# Patient Record
Sex: Male | Born: 1960 | Race: Black or African American | Hispanic: No | Marital: Single | Smoking: Former smoker
Health system: Southern US, Community
[De-identification: ages and names within clinical notes are randomized; demographics above are authoritative.]

## PROBLEM LIST (undated history)

## (undated) DIAGNOSIS — I10 Essential (primary) hypertension: Secondary | ICD-10-CM

## (undated) DIAGNOSIS — F1910 Other psychoactive substance abuse, uncomplicated: Secondary | ICD-10-CM

## (undated) DIAGNOSIS — F1420 Cocaine dependence, uncomplicated: Secondary | ICD-10-CM

## (undated) DIAGNOSIS — H547 Unspecified visual loss: Secondary | ICD-10-CM

## (undated) DIAGNOSIS — E7800 Pure hypercholesterolemia, unspecified: Secondary | ICD-10-CM

## (undated) DIAGNOSIS — F17200 Nicotine dependence, unspecified, uncomplicated: Secondary | ICD-10-CM

## (undated) HISTORY — PX: EYE SURGERY: SHX253

## (undated) HISTORY — DX: Other psychoactive substance abuse, uncomplicated: F19.10

## (undated) HISTORY — DX: Essential (primary) hypertension: I10

## (undated) HISTORY — DX: Nicotine dependence, unspecified, uncomplicated: F17.200

---

## 2003-01-09 ENCOUNTER — Emergency Department (HOSPITAL_COMMUNITY): Admission: EM | Admit: 2003-01-09 | Discharge: 2003-01-09 | Payer: Self-pay | Admitting: Emergency Medicine

## 2008-02-21 ENCOUNTER — Emergency Department (HOSPITAL_COMMUNITY): Admission: EM | Admit: 2008-02-21 | Discharge: 2008-02-21 | Payer: Self-pay | Admitting: Emergency Medicine

## 2008-06-14 ENCOUNTER — Emergency Department (HOSPITAL_COMMUNITY): Admission: EM | Admit: 2008-06-14 | Discharge: 2008-06-14 | Payer: Self-pay | Admitting: Emergency Medicine

## 2009-03-08 DIAGNOSIS — E1136 Type 2 diabetes mellitus with diabetic cataract: Secondary | ICD-10-CM | POA: Insufficient documentation

## 2009-05-03 ENCOUNTER — Ambulatory Visit: Payer: Self-pay | Admitting: Internal Medicine

## 2009-05-10 ENCOUNTER — Ambulatory Visit: Payer: Self-pay | Admitting: *Deleted

## 2009-07-21 ENCOUNTER — Ambulatory Visit: Payer: Self-pay | Admitting: Internal Medicine

## 2009-07-21 ENCOUNTER — Encounter: Payer: Self-pay | Admitting: Family Medicine

## 2009-07-21 LAB — CONVERTED CEMR LAB
ALT: 27 units/L (ref 0–53)
AST: 30 units/L (ref 0–37)
Albumin: 4.3 g/dL (ref 3.5–5.2)
Amphetamine Screen, Ur: NEGATIVE
Benzodiazepines.: NEGATIVE
CO2: 24 meq/L (ref 19–32)
Calcium: 9.1 mg/dL (ref 8.4–10.5)
Chloride: 103 meq/L (ref 96–112)
Cholesterol: 202 mg/dL — ABNORMAL HIGH (ref 0–200)
Cocaine Metabolites: NEGATIVE
Creatinine,U: 157.9 mg/dL
Eosinophils Absolute: 0.2 10*3/uL (ref 0.0–0.7)
Eosinophils Relative: 3 % (ref 0–5)
HCT: 39.3 % (ref 39.0–52.0)
Lymphocytes Relative: 47 % — ABNORMAL HIGH (ref 12–46)
Lymphs Abs: 2.6 10*3/uL (ref 0.7–4.0)
MCV: 93.3 fL (ref 78.0–100.0)
Monocytes Relative: 6 % (ref 3–12)
Neutrophils Relative %: 44 % (ref 43–77)
PSA: 0.66 ng/mL (ref 0.10–4.00)
Phencyclidine (PCP): NEGATIVE
Platelets: 334 10*3/uL (ref 150–400)
Potassium: 3.9 meq/L (ref 3.5–5.3)
RBC: 4.21 M/uL — ABNORMAL LOW (ref 4.22–5.81)
TSH: 1.258 microintl units/mL (ref 0.350–4.500)
WBC: 5.6 10*3/uL (ref 4.0–10.5)

## 2009-08-04 ENCOUNTER — Ambulatory Visit: Payer: Self-pay | Admitting: Internal Medicine

## 2009-09-01 ENCOUNTER — Ambulatory Visit: Payer: Self-pay | Admitting: Internal Medicine

## 2009-10-07 ENCOUNTER — Ambulatory Visit: Payer: Self-pay | Admitting: Internal Medicine

## 2010-02-22 ENCOUNTER — Ambulatory Visit: Payer: Self-pay | Admitting: Internal Medicine

## 2010-06-02 ENCOUNTER — Ambulatory Visit: Payer: Self-pay | Admitting: Internal Medicine

## 2010-06-06 ENCOUNTER — Ambulatory Visit: Payer: Self-pay | Admitting: Internal Medicine

## 2010-06-06 LAB — CONVERTED CEMR LAB
PSA: 0.7 ng/mL (ref 0.10–4.00)
Testosterone: 294.01 ng/dL — ABNORMAL LOW (ref 350–890)

## 2010-06-13 ENCOUNTER — Ambulatory Visit: Payer: Self-pay | Admitting: Internal Medicine

## 2010-06-27 ENCOUNTER — Ambulatory Visit: Payer: Self-pay | Admitting: Internal Medicine

## 2010-10-11 ENCOUNTER — Emergency Department (HOSPITAL_COMMUNITY)
Admission: EM | Admit: 2010-10-11 | Discharge: 2010-10-12 | Payer: Self-pay | Source: Home / Self Care | Admitting: Emergency Medicine

## 2010-11-07 ENCOUNTER — Encounter (INDEPENDENT_AMBULATORY_CARE_PROVIDER_SITE_OTHER): Payer: Self-pay | Admitting: Internal Medicine

## 2010-11-07 LAB — CONVERTED CEMR LAB
ALT: 12 units/L (ref 0–53)
AST: 12 units/L (ref 0–37)
Albumin: 4.1 g/dL (ref 3.5–5.2)
Alkaline Phosphatase: 67 units/L (ref 39–117)
BUN: 14 mg/dL (ref 6–23)
Potassium: 4.1 meq/L (ref 3.5–5.3)
Sodium: 132 meq/L — ABNORMAL LOW (ref 135–145)

## 2010-11-14 ENCOUNTER — Encounter (INDEPENDENT_AMBULATORY_CARE_PROVIDER_SITE_OTHER): Payer: Self-pay | Admitting: *Deleted

## 2010-11-14 LAB — CONVERTED CEMR LAB
BUN: 14 mg/dL (ref 6–23)
Creatinine, Ser: 1.11 mg/dL (ref 0.40–1.50)
Glucose, Bld: 460 mg/dL — ABNORMAL HIGH (ref 70–99)
Microalb, Ur: 1.21 mg/dL (ref 0.00–1.89)
Potassium: 4.3 meq/L (ref 3.5–5.3)

## 2011-02-07 LAB — URINALYSIS, ROUTINE W REFLEX MICROSCOPIC
Bilirubin Urine: NEGATIVE
Glucose, UA: >1000 mg/dL — AB
Ketones, ur: 40 mg/dL — AB
pH: 5 (ref 5.0–8.0)

## 2011-02-07 LAB — DIFFERENTIAL
Eosinophils Relative: 1 % (ref 0–5)
Lymphocytes Relative: 41 % (ref 12–46)
Lymphs Abs: 2.8 10*3/uL (ref 0.7–4.0)
Monocytes Absolute: 0.5 10*3/uL (ref 0.1–1.0)

## 2011-02-07 LAB — POCT I-STAT, CHEM 8
Chloride: 94 mEq/L — ABNORMAL LOW (ref 96–112)
HCT: 53 % — ABNORMAL HIGH (ref 39.0–52.0)
Potassium: 3.9 mEq/L (ref 3.5–5.1)
Sodium: 130 mEq/L — ABNORMAL LOW (ref 135–145)

## 2011-02-07 LAB — GLUCOSE, CAPILLARY

## 2011-02-07 LAB — URINE MICROSCOPIC-ADD ON

## 2011-02-07 LAB — CBC
HCT: 46.5 % (ref 39.0–52.0)
MCH: 32 pg (ref 26.0–34.0)
MCV: 87.4 fL (ref 78.0–100.0)
RBC: 5.32 MIL/uL (ref 4.22–5.81)
RDW: 13.4 % (ref 11.5–15.5)
WBC: 6.7 10*3/uL (ref 4.0–10.5)

## 2012-01-08 HISTORY — PX: CATARACT EXTRACTION: SUR2

## 2012-01-17 ENCOUNTER — Encounter: Payer: Self-pay | Admitting: Internal Medicine

## 2012-01-17 ENCOUNTER — Ambulatory Visit (INDEPENDENT_AMBULATORY_CARE_PROVIDER_SITE_OTHER): Payer: Self-pay | Admitting: Internal Medicine

## 2012-01-17 DIAGNOSIS — Z8679 Personal history of other diseases of the circulatory system: Secondary | ICD-10-CM

## 2012-01-17 DIAGNOSIS — Z8639 Personal history of other endocrine, nutritional and metabolic disease: Secondary | ICD-10-CM | POA: Insufficient documentation

## 2012-01-17 DIAGNOSIS — E119 Type 2 diabetes mellitus without complications: Secondary | ICD-10-CM

## 2012-01-17 DIAGNOSIS — Z79899 Other long term (current) drug therapy: Secondary | ICD-10-CM

## 2012-01-17 DIAGNOSIS — K219 Gastro-esophageal reflux disease without esophagitis: Secondary | ICD-10-CM | POA: Insufficient documentation

## 2012-01-17 DIAGNOSIS — I10 Essential (primary) hypertension: Secondary | ICD-10-CM | POA: Insufficient documentation

## 2012-01-17 LAB — LIPID PANEL
Cholesterol: 211 mg/dL — ABNORMAL HIGH (ref 0–200)
HDL: 43 mg/dL (ref >39)
LDL Cholesterol: 153 mg/dL — ABNORMAL HIGH (ref 0–99)
VLDL: 15 mg/dL (ref 0–40)

## 2012-01-17 LAB — GLUCOSE, CAPILLARY: Glucose-Capillary: 97 mg/dL (ref 70–99)

## 2012-01-17 NOTE — Assessment & Plan Note (Signed)
Has history of DM 2- but HbA1c today is 5.5, although he is taking metformin 1 pill( unknown dose) every other day for past 6-7 months. That would point towards him not having any diabetes now.  He does have a history of CBG more than 600 for which he was observed and CDU in ER, in 2011.  Though I will check lipid panel, CMP and urine microalbumin- creatinine ratio today.  I will see him back in 3-4 months for repeat A1c.

## 2012-01-17 NOTE — Patient Instructions (Signed)
Please make followup appointment in 3-4 months.  Your diabetes and blood pressure are under well control and does not need medication at this point of time. During next visit we'll check your back and see if you need any medications .  I will give you a call if any of the lab tests are abnormal, otherwise we'll see you back in about 3-4 months.

## 2012-01-17 NOTE — Assessment & Plan Note (Signed)
Blood pressure today is 133/84 which is within normal limits.  He is supposed to be on Maxzide and Norvasc which is not taking for past 6-7 months.  Based on today's blood pressure, I won't start him on blood pressure pills today.  I will see him back in 3-4 months and recheck blood pressure and decide further management.

## 2012-01-17 NOTE — Assessment & Plan Note (Signed)
Intermittent symptoms. Not on any PPI at present.

## 2012-01-17 NOTE — Progress Notes (Signed)
  Subjective:    Patient ID: Brady Church, male    DOB: [birth year], 51 y.o.   MRN: [id number]  HPI Brady Church is a pleasant 51 year old man with past history of DM 2 and hypertension diagnosed about one year ago, who comes to the clinic as a new patient for establishment visit.  [facility], where he saw his doctor last time about 11 months before. He was diagnosed with DM 2 and hypertension about more than a year ago. He is supposed to be on metformin- dose unknown, which he takes one tablet every other day. For his blood pressure, he is supposed to be on Maxzide and Norvasc which is not taking for past 6-7 months. His blood pressure today is 133/84 and his HbA1c is 5.5 and CBG 97.  He denies any symptoms of polyuria, polydipsia, nausea, vomiting, fever, chills, chest pain, short of breath, headache, palpitations, abdominal pain, diarrhea.  He had his cataract removal surgery on his right eye by Dr. Dione Booze a week before- which he got it for free under a special program.  He has history of GERD- his reflux symptoms once every week- but is not taking any medications for that.   Review of Systems    as per history of present illness, all other systems reviewed and negative. Objective:   Physical Exam General: NAD HEENT: PERRL, EOMI, no scleral icterus Cardiac: S1, S2, RRR, no rubs, murmurs or gallops Pulm: clear to auscultation bilaterally, moving normal volumes of air Abd: soft, nontender, nondistended, BS present Ext: warm and well perfused, no pedal edema Neuro: alert and oriented X3, cranial nerves II-XII grossly intact        Assessment & Plan:

## 2012-01-18 LAB — COMPLETE METABOLIC PANEL WITH GFR
ALT: 17 U/L (ref 0–53)
Albumin: 4.5 g/dL (ref 3.5–5.2)
Alkaline Phosphatase: 60 U/L (ref 39–117)
BUN: 18 mg/dL (ref 6–23)
Calcium: 10 mg/dL (ref 8.4–10.5)
GFR, Est African American: >89 mL/min
GFR, Est Non African American: >89 mL/min
Glucose, Bld: 83 mg/dL (ref 70–99)
Potassium: 3.9 mEq/L (ref 3.5–5.3)
Sodium: 142 mEq/L (ref 135–145)
Total Protein: 7.4 g/dL (ref 6.0–8.3)

## 2012-01-18 LAB — MICROALBUMIN / CREATININE URINE RATIO: Microalb, Ur: 6.55 mg/dL — ABNORMAL HIGH (ref 0.00–1.89)

## 2012-03-22 ENCOUNTER — Encounter: Payer: Self-pay | Admitting: Internal Medicine

## 2012-03-22 ENCOUNTER — Ambulatory Visit (INDEPENDENT_AMBULATORY_CARE_PROVIDER_SITE_OTHER): Payer: Self-pay | Admitting: Internal Medicine

## 2012-03-22 VITALS — BP 156/103 | HR 89 | Temp 97.5°F | Ht 65.0 in | Wt 226.8 lb

## 2012-03-22 DIAGNOSIS — E119 Type 2 diabetes mellitus without complications: Secondary | ICD-10-CM

## 2012-03-22 DIAGNOSIS — Z8679 Personal history of other diseases of the circulatory system: Secondary | ICD-10-CM

## 2012-03-22 DIAGNOSIS — E785 Hyperlipidemia, unspecified: Secondary | ICD-10-CM | POA: Insufficient documentation

## 2012-03-22 LAB — GLUCOSE, CAPILLARY: Glucose-Capillary: 107 mg/dL — ABNORMAL HIGH (ref 70–99)

## 2012-03-22 MED ORDER — PRAVASTATIN SODIUM 40 MG PO TABS: 40.0000 mg | ORAL_TABLET | Freq: Every day | ORAL | Status: DC

## 2012-03-22 MED ORDER — LISINOPRIL 20 MG PO TABS: 20.0000 mg | ORAL_TABLET | Freq: Every day | ORAL | Status: DC

## 2012-03-22 NOTE — Progress Notes (Signed)
  Subjective:    Patient ID: Brady Church, male    DOB: [birth year], 51 y.o.   MRN: [id number]  HPI Brady Church is a pleasant 51 year old man with past history of DM 2, hypertension, GERD who comes to the clinic for regular followup visit. I saw him in February 2013 first time in our clinic- and which was in his hemoglobin A1c was 5.5. He is reported to have DM 2. I did not start him on any medications at his CBG today was 107. He also has history of hypertension but was not on any medications- for about 7-8 months now. He was supposed to be on Maxzide and Norvasc. During his last clinic visit his blood pressure was within normal limits and so I did not start him on blood pressure pills- but today his blood pressure is 156/103. His lipid panel done in February 2013- showed total cholesterol 211 with LDL 157. He is totally asymptomatic at present. Denies any fever, chills, nausea vomiting, headache, vision changes, palpitations, diarrhea, chest pain, shortness of breath.     Review of Systems     as per history of present illness, all other systems reviewed and negative.  Objective:   Physical Exam General: NAD HEENT: PERRL, EOMI, no scleral icterus Cardiac: RRR, no rubs, murmurs or gallops Pulm: clear to auscultation bilaterally, moving normal volumes of air Abd: soft, nontender, nondistended, BS present Ext: warm and well perfused, no pedal edema Neuro: alert and oriented X3, cranial nerves II-XII grossly intact        Assessment & Plan:

## 2012-03-22 NOTE — Assessment & Plan Note (Signed)
Blood pressure elevated to 156/103 today. Will start him on lisinopril 20 mg daily considering history of diabetes. Recheck kidney functions in 3-4 weeks. If needed we'll add diuretic- HCTZ.

## 2012-03-22 NOTE — Patient Instructions (Signed)
Please make a followup appointment in 3-4 weeks for blood pressure check and lab test for kidney function  As you are started back on blood pressure pills.  Start taking pravastatin 40 mg daily for high cholesterol. Also start the lisinopril 20 mg daily for high blood pressure.

## 2012-03-22 NOTE — Assessment & Plan Note (Signed)
Has history of DM 2- but unclear if he has anymore. We will check hemoglobin A1c during next visit and see if he is diabetic-and do appropriate management as needed.

## 2012-03-22 NOTE — Assessment & Plan Note (Signed)
I will start him on Pravachol 40 mg daily- for his high LDL cholesterol. This is the goal of LDL to less than 100 considering his history of diabetes. Will see him back in 3-4 weeks to check his CMP- for kidney functions as I started him on lisinopril today

## 2012-04-17 ENCOUNTER — Ambulatory Visit (INDEPENDENT_AMBULATORY_CARE_PROVIDER_SITE_OTHER): Payer: Self-pay | Admitting: Internal Medicine

## 2012-04-17 ENCOUNTER — Encounter: Payer: Self-pay | Admitting: Internal Medicine

## 2012-04-17 VITALS — BP 139/88 | HR 68 | Temp 98.2°F | Ht 68.0 in | Wt 229.4 lb

## 2012-04-17 DIAGNOSIS — E785 Hyperlipidemia, unspecified: Secondary | ICD-10-CM

## 2012-04-17 DIAGNOSIS — Z79899 Other long term (current) drug therapy: Secondary | ICD-10-CM

## 2012-04-17 DIAGNOSIS — I10 Essential (primary) hypertension: Secondary | ICD-10-CM

## 2012-04-17 DIAGNOSIS — E119 Type 2 diabetes mellitus without complications: Secondary | ICD-10-CM

## 2012-04-17 LAB — COMPLETE METABOLIC PANEL WITHOUT GFR
ALT: 17 U/L (ref 0–53)
AST: 17 U/L (ref 0–37)
Albumin: 4.1 g/dL (ref 3.5–5.2)
Alkaline Phosphatase: 50 U/L (ref 39–117)
BUN: 14 mg/dL (ref 6–23)
CO2: 22 meq/L (ref 19–32)
Calcium: 9.2 mg/dL (ref 8.4–10.5)
Chloride: 108 meq/L (ref 96–112)
Creat: 1.01 mg/dL (ref 0.50–1.35)
GFR, Est African American: >89 mL/min
GFR, Est Non African American: 87 mL/min
Glucose, Bld: 83 mg/dL (ref 70–99)
Potassium: 3.7 meq/L (ref 3.5–5.3)
Sodium: 142 meq/L (ref 135–145)
Total Bilirubin: 0.5 mg/dL (ref 0.3–1.2)
Total Protein: 6.8 g/dL (ref 6.0–8.3)

## 2012-04-17 LAB — POCT GLYCOSYLATED HEMOGLOBIN (HGB A1C): Hemoglobin A1C: 5.4

## 2012-04-17 NOTE — Assessment & Plan Note (Signed)
Will check CMP today and continue on pravastatin 40 daily- which he is tolerating well. Considering his history of diabetes, his goal of LDL less than 100.

## 2012-04-17 NOTE — Assessment & Plan Note (Signed)
Blood pressure at goal on lisinopril 20 mg daily started during last visit. Will check renal functions. Will add HCTZ in future if needed for blood pressure control.

## 2012-04-17 NOTE — Assessment & Plan Note (Signed)
Patient not on any antidiabetic medication for past 10 months or so and his hemoglobin A1c is staying in normal range. I will change the diagnosis to history of DM 2. Will reassess in 6-8 months with A1c.

## 2012-04-17 NOTE — Progress Notes (Signed)
  Subjective:    Patient ID: Brady Church, male    DOB: [birth year], 51 y.o.   MRN: [id number]  HPI Brady Church is a pleasant 51 year old man with past history of hypertension, hyperlipidemia, history of DM 2, comes for followup visit for his high blood pressure and diabetes.  He was diagnosed with DM 2 about a year and half before, was on metformin on and off. Although he is not on any diabetic medication for past 10 months or so and is A1c today is 5.4 which is essentially unchanged from one February 2013 and is reassuring that his diabetes is not active. He was started on lisinopril 20 mg daily during last visit and his blood pressure today is 139/88 which is at goal. He denies any chest pain, short of breath, abdominal pain, nausea vomiting, fever, chills, recent weight loss or change in appetite. He is trying to find a new job but is unsuccessful for past many months. He does not use computer much and so doesn't do all her medication and on asking, he was wondering if he can get some help with that.    Review of Systems    as per history of present illness, all other systems reviewed and negative. Objective:   Physical Exam  General: NAD HEENT: PERRL, EOMI, no scleral icterus Cardiac: RRR, no rubs, murmurs or gallops Pulm: clear to auscultation bilaterally, moving normal volumes of air Abd: soft, nontender, nondistended, BS present Ext: warm and well perfused, no pedal edema Neuro: alert and oriented X3, cranial nerves II-XII grossly intact Skin: Right forearm 0.5 x 0.5 cm round rash- from bug bite.      Assessment & Plan:

## 2012-04-17 NOTE — Patient Instructions (Signed)
Please make followup appointment in 6-8 months. Meanwhile, continue taking lisinopril for blood pressure and pravastatin for high cholesterol. If you go to [facility] or your pharmacy, get your blood pressure checked and if it stays more than 140/90, give [place] a call to make an early appointment.  Also if anything else comes up meanwhile, call the clinic and make an early appointment.  If any of the lab tests are abnormal today, I will call you. If I don't call you, everything is okay on lab tests.  - Also or social worker will give you a call for help with your job applications.

## 2012-04-19 ENCOUNTER — Telehealth: Payer: Self-pay | Admitting: Licensed Clinical Social Worker

## 2012-04-19 NOTE — Telephone Encounter (Signed)
Brady Church has been referred to CSW for assistance with vocational services.  CSW placed called to pt.  CSW left message requesting return call. CSW provided contact hours and phone number.  CSW will provide pt information on [facility] and [facility].

## 2012-04-24 NOTE — Telephone Encounter (Signed)
CSW placed called to pt.  CSW left message with male family member requesting return call. CSW provided contact hours and phone number.

## 2012-04-26 NOTE — Telephone Encounter (Signed)
Brady Church returned call to CSW.  CSW informed pt of two agencies in [facility] that can assist with computer job search and online services.  "Is it free, at [employer] you need a card" questioned Brady Church.  CSW informed pt both services are free and have on-site job counselors to assist.  Pt requesting CSW to place information in the mail, as Brady Church did not have pen/paper at this time.  CSW placed [facility] information and [employer] information in the mail to pt.  Pt aware CSW is available to assist as needed.

## 2012-05-10 ENCOUNTER — Inpatient Hospital Stay (HOSPITAL_COMMUNITY)
Admission: EM | Admit: 2012-05-10 | Discharge: 2012-05-12 | DRG: 684 | Disposition: A | Discharge status: Home / Self Care | Payer: Self-pay | Attending: Internal Medicine | Admitting: Internal Medicine

## 2012-05-10 ENCOUNTER — Encounter (HOSPITAL_COMMUNITY): Payer: Self-pay | Admitting: *Deleted

## 2012-05-10 DIAGNOSIS — E119 Type 2 diabetes mellitus without complications: Secondary | ICD-10-CM | POA: Diagnosis present

## 2012-05-10 DIAGNOSIS — Z8249 Family history of ischemic heart disease and other diseases of the circulatory system: Secondary | ICD-10-CM

## 2012-05-10 DIAGNOSIS — Z87891 Personal history of nicotine dependence: Secondary | ICD-10-CM

## 2012-05-10 DIAGNOSIS — Z8639 Personal history of other endocrine, nutritional and metabolic disease: Secondary | ICD-10-CM

## 2012-05-10 DIAGNOSIS — R55 Syncope and collapse: Secondary | ICD-10-CM

## 2012-05-10 DIAGNOSIS — N179 Acute kidney failure, unspecified: Principal | ICD-10-CM

## 2012-05-10 DIAGNOSIS — E7800 Pure hypercholesterolemia, unspecified: Secondary | ICD-10-CM | POA: Diagnosis present

## 2012-05-10 DIAGNOSIS — H409 Unspecified glaucoma: Secondary | ICD-10-CM | POA: Diagnosis present

## 2012-05-10 DIAGNOSIS — E785 Hyperlipidemia, unspecified: Secondary | ICD-10-CM

## 2012-05-10 DIAGNOSIS — Z833 Family history of diabetes mellitus: Secondary | ICD-10-CM

## 2012-05-10 DIAGNOSIS — E860 Dehydration: Secondary | ICD-10-CM

## 2012-05-10 DIAGNOSIS — I10 Essential (primary) hypertension: Secondary | ICD-10-CM

## 2012-05-10 HISTORY — DX: Pure hypercholesterolemia, unspecified: E78.00

## 2012-05-10 LAB — COMPREHENSIVE METABOLIC PANEL
Alkaline Phosphatase: 60 U/L (ref 39–117)
BUN: 55 mg/dL — ABNORMAL HIGH (ref 6–23)
GFR calc Af Amer: 16 mL/min — ABNORMAL LOW (ref >90)
GFR calc non Af Amer: 14 mL/min — ABNORMAL LOW (ref >90)
Glucose, Bld: 115 mg/dL — ABNORMAL HIGH (ref 70–99)
Potassium: 4.1 mEq/L (ref 3.5–5.1)
Total Protein: 8.4 g/dL — ABNORMAL HIGH (ref 6.0–8.3)

## 2012-05-10 LAB — CBC
Hemoglobin: 15.2 g/dL (ref 13.0–17.0)
MCH: 33.6 pg (ref 26.0–34.0)
MCV: 89.8 fL (ref 78.0–100.0)
RBC: 4.52 MIL/uL (ref 4.22–5.81)

## 2012-05-10 LAB — GLUCOSE, CAPILLARY: Glucose-Capillary: 123 mg/dL — ABNORMAL HIGH (ref 70–99)

## 2012-05-10 LAB — DIFFERENTIAL
Eosinophils Absolute: 0 10*3/uL (ref 0.0–0.7)
Eosinophils Relative: 0 % (ref 0–5)
Lymphs Abs: 2.7 10*3/uL (ref 0.7–4.0)
Monocytes Relative: 5 % (ref 3–12)

## 2012-05-10 LAB — POCT I-STAT TROPONIN I: Troponin i, poc: 0.01 ng/mL (ref 0.00–0.08)

## 2012-05-10 LAB — POCT I-STAT, CHEM 8
BUN: 51 mg/dL — ABNORMAL HIGH (ref 6–23)
HCT: 45 % (ref 39.0–52.0)
Hemoglobin: 15.3 g/dL (ref 13.0–17.0)
Sodium: 140 mEq/L (ref 135–145)
TCO2: 19 mmol/L (ref 0–100)

## 2012-05-10 MED ORDER — SODIUM CHLORIDE 0.9 % IV SOLN: Freq: Once | INTRAVENOUS | Status: DC

## 2012-05-10 NOTE — ED Notes (Signed)
Labs reviewed, triage RN and CN notified.

## 2012-05-10 NOTE — ED Provider Notes (Addendum)
History     CSN: [id number]  Arrival date & time 05/10/12  1946   First MD Initiated Contact with Patient 05/10/12 2333      Chief Complaint  Patient presents with  . Near Syncope    (Consider location/radiation/quality/duration/timing/severity/associated sxs/prior treatment) HPI Comments: Patient with history of DM presents feeling weak, light-headed since yesterday.  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  Yesterday nearly passed out.    Patient is a 51 y.o. male presenting with weakness. The history is provided by the patient.  Weakness The primary symptoms include dizziness. The symptoms began yesterday. The symptoms are worsening. The neurological symptoms are diffuse. Context: working outside in the heat.  Dizziness also occurs with weakness.  Additional symptoms include weakness.    Past Medical History  Diagnosis Date  . Diabetes mellitus   . Hypertension   . Substance abuse     cocaine. Stopped in 2010  . High cholesterol     Past Surgical History  Procedure Date  . Cataract extraction 01/08/2012    Right eye    Family History  Problem Relation Age of Onset  . Coronary artery disease Mother 85    died of MI  . Cancer Father 51    Some GI cancer. Not sure if it was Colon Cancer or not.  . Hypertension Father   . Diabetes Maternal Grandmother     History  Substance Use Topics  . Smoking status: Former Smoker    Quit date: 01/16/2006  . Smokeless tobacco: Not on file  . Alcohol Use: Yes     24 oz beer every other day      Review of Systems  Neurological: Positive for dizziness and weakness.  All other systems reviewed and are negative.    Allergies  Review of patient's allergies indicates no known allergies.  Home Medications   Current Outpatient Rx  Name Route Sig Dispense Refill  . BIMATOPROST 0.03 % OP SOLN Both Eyes Place 1 drop into both eyes at bedtime.    Marland Kitchen BRIMONIDINE TARTRATE 0.2 % OP SOLN Both Eyes Place 1 drop into both  eyes 2 (two) times daily.    . DORZOLAMIDE HCL 2 % OP SOLN Both Eyes Place 1 drop into both eyes 2 (two) times daily.    Marland Kitchen LISINOPRIL 20 MG PO TABS Oral Take 20 mg by mouth daily.    Marland Kitchen PRAVASTATIN SODIUM 40 MG PO TABS Oral Take 40 mg by mouth daily.      BP 127/74  Pulse 65  Temp 97.6 F (36.4 C) (Oral)  Resp 18  SpO2 100%  Physical Exam  Nursing note and vitals reviewed. Constitutional: He is oriented to person, place, and time. He appears well-developed and well-nourished. No distress.  HENT:  Head: Normocephalic and atraumatic.  Eyes: EOM are normal. Pupils are equal, round, and reactive to light.  Neck: Normal range of motion. Neck supple.  Cardiovascular: Normal rate and regular rhythm.   No murmur heard. Pulmonary/Chest: Effort normal and breath sounds normal. No respiratory distress.  Abdominal: Soft. Bowel sounds are normal. He exhibits no distension. There is no tenderness.  Musculoskeletal: Normal range of motion. He exhibits no edema.  Neurological: He is alert and oriented to person, place, and time. No cranial nerve deficit. He exhibits normal muscle tone. Coordination normal.  Skin: Skin is warm and dry. He is not diaphoretic.    ED Course  Procedures (including critical care time)  Labs Reviewed  GLUCOSE, CAPILLARY - Abnormal; Notable for the following:    Glucose-Capillary 123 (*)     All other components within normal limits  CBC - Abnormal; Notable for the following:    MCHC 37.4 (*)     All other components within normal limits  COMPREHENSIVE METABOLIC PANEL - Abnormal; Notable for the following:    CO2 17 (*)     Glucose, Bld 115 (*)     BUN 55 (*)     Creatinine, Ser 4.49 (*)     Total Protein 8.4 (*)     GFR calc non Af Amer 14 (*)     GFR calc Af Amer 16 (*)     All other components within normal limits  POCT I-STAT, CHEM 8 - Abnormal; Notable for the following:    BUN 51 (*)     Creatinine, Ser 4.70 (*)     Glucose, Bld 114 (*)     All other  components within normal limits  DIFFERENTIAL  POCT I-STAT TROPONIN I  URINALYSIS, ROUTINE W REFLEX MICROSCOPIC   No results found.   No diagnosis found.   Date: 06/06/2012  Rate: 59  Rhythm: normal sinus rhythm  QRS Axis: normal  Intervals: normal  ST/T Wave abnormalities: normal  Conduction Disutrbances:none  Narrative Interpretation:   Old EKG Reviewed: none available    MDM  The labs show the patient to be in acute renal failure with a BUN and Cr or 55/4.5.  I have initiated ivf's and have consulted outpatient clinics for admission.        Geoffery Lyons, MD 05/11/12 8469  Geoffery Lyons, MD 06/06/12 1043

## 2012-05-10 NOTE — ED Notes (Signed)
EDP notified of labwork

## 2012-05-10 NOTE — ED Notes (Signed)
Pt states he was sitting on a wall, felt hot then a friend said he passed out. Pt denies dizziness, HA, weakness falling or hitting head, "just felt hot" pt states diabetic sugar might have dropped. Pt alert and oriented ambulatory in triage, CBG checked and WNL.

## 2012-05-10 NOTE — ED Notes (Signed)
Pt states he feels "fine" right now.  Notified of test results to far.  Taken to triage for EKG.

## 2012-05-11 ENCOUNTER — Encounter (HOSPITAL_COMMUNITY): Payer: Self-pay | Admitting: Internal Medicine

## 2012-05-11 DIAGNOSIS — H409 Unspecified glaucoma: Secondary | ICD-10-CM | POA: Diagnosis present

## 2012-05-11 DIAGNOSIS — E785 Hyperlipidemia, unspecified: Secondary | ICD-10-CM

## 2012-05-11 DIAGNOSIS — I10 Essential (primary) hypertension: Secondary | ICD-10-CM

## 2012-05-11 DIAGNOSIS — E119 Type 2 diabetes mellitus without complications: Secondary | ICD-10-CM

## 2012-05-11 DIAGNOSIS — R55 Syncope and collapse: Secondary | ICD-10-CM

## 2012-05-11 DIAGNOSIS — N179 Acute kidney failure, unspecified: Principal | ICD-10-CM

## 2012-05-11 LAB — BASIC METABOLIC PANEL
BUN: 49 mg/dL — ABNORMAL HIGH (ref 6–23)
CO2: 21 mEq/L (ref 19–32)
Calcium: 8.8 mg/dL (ref 8.4–10.5)
GFR calc non Af Amer: 30 mL/min — ABNORMAL LOW (ref >90)
Glucose, Bld: 110 mg/dL — ABNORMAL HIGH (ref 70–99)

## 2012-05-11 LAB — URINALYSIS, ROUTINE W REFLEX MICROSCOPIC
Bilirubin Urine: NEGATIVE
Ketones, ur: NEGATIVE mg/dL
Nitrite: NEGATIVE
Protein, ur: NEGATIVE mg/dL
Specific Gravity, Urine: 1.022 (ref 1.005–1.030)
Urobilinogen, UA: 0.2 mg/dL (ref 0.0–1.0)

## 2012-05-11 LAB — CREATININE, URINE, RANDOM: Creatinine, Urine: 244.59 mg/dL

## 2012-05-11 LAB — CARDIAC PANEL(CRET KIN+CKTOT+MB+TROPI)
CK, MB: 3.6 ng/mL (ref 0.3–4.0)
CK, MB: 3.3 ng/mL (ref 0.3–4.0)
Relative Index: 0.8 (ref 0.0–2.5)
Total CK: 427 U/L — ABNORMAL HIGH (ref 7–232)
Total CK: 382 U/L — ABNORMAL HIGH (ref 7–232)
Troponin I: <0.3 ng/mL (ref <0.30)

## 2012-05-11 LAB — LIPID PANEL
Cholesterol: 164 mg/dL (ref 0–200)
HDL: 38 mg/dL — ABNORMAL LOW (ref >39)
Total CHOL/HDL Ratio: 4.3 RATIO
VLDL: 25 mg/dL (ref 0–40)

## 2012-05-11 MED ORDER — SIMVASTATIN 20 MG PO TABS
20.0000 mg | ORAL_TABLET | Freq: Every day | ORAL | Status: DC
  Administered 2012-05-11: 20 mg via ORAL
  Filled 2012-05-11 (×2): qty 1

## 2012-05-11 MED ORDER — BIMATOPROST 0.03 % OP SOLN: 1.0000 [drp] | Freq: Every day | OPHTHALMIC | Status: DC

## 2012-05-11 MED ORDER — DORZOLAMIDE HCL 2 % OP SOLN
1.0000 [drp] | Freq: Two times a day (BID) | OPHTHALMIC | Status: DC
  Administered 2012-05-11 – 2012-05-12 (×3): 1 [drp] via OPHTHALMIC
  Filled 2012-05-11: qty 10

## 2012-05-11 MED ORDER — SODIUM CHLORIDE 0.9 % IV SOLN
INTRAVENOUS | Status: DC
  Administered 2012-05-11 – 2012-05-12 (×2): via INTRAVENOUS

## 2012-05-11 MED ORDER — FOLIC ACID 1 MG PO TABS
1.0000 mg | ORAL_TABLET | Freq: Every day | ORAL | Status: DC
  Administered 2012-05-11 – 2012-05-12 (×2): 1 mg via ORAL
  Filled 2012-05-11 (×2): qty 1

## 2012-05-11 MED ORDER — SODIUM CHLORIDE 0.9 % IV BOLUS (SEPSIS)
1000.0000 mL | INTRAVENOUS | Status: AC
  Administered 2012-05-11 (×2): 1000 mL via INTRAVENOUS

## 2012-05-11 MED ORDER — BRIMONIDINE TARTRATE 0.2 % OP SOLN
1.0000 [drp] | Freq: Two times a day (BID) | OPHTHALMIC | Status: DC
  Administered 2012-05-11 – 2012-05-12 (×3): 1 [drp] via OPHTHALMIC
  Filled 2012-05-11: qty 5

## 2012-05-11 MED ORDER — DOCUSATE SODIUM 100 MG PO CAPS
100.0000 mg | ORAL_CAPSULE | Freq: Two times a day (BID) | ORAL | Status: DC
  Administered 2012-05-11 – 2012-05-12 (×3): 100 mg via ORAL
  Filled 2012-05-11 (×4): qty 1

## 2012-05-11 MED ORDER — VITAMIN B-1 100 MG PO TABS
100.0000 mg | ORAL_TABLET | Freq: Every day | ORAL | Status: DC
  Administered 2012-05-11 – 2012-05-12 (×2): 100 mg via ORAL
  Filled 2012-05-11 (×2): qty 1

## 2012-05-11 MED ORDER — ACETAMINOPHEN 650 MG RE SUPP: 650.0000 mg | Freq: Four times a day (QID) | RECTAL | Status: DC | PRN

## 2012-05-11 MED ORDER — ONDANSETRON HCL 4 MG/2ML IJ SOLN: 4.0000 mg | Freq: Four times a day (QID) | INTRAMUSCULAR | Status: DC | PRN

## 2012-05-11 MED ORDER — ACETAMINOPHEN 325 MG PO TABS: 650.0000 mg | ORAL_TABLET | Freq: Four times a day (QID) | ORAL | Status: DC | PRN

## 2012-05-11 MED ORDER — HYDRALAZINE HCL 20 MG/ML IJ SOLN
5.0000 mg | Freq: Four times a day (QID) | INTRAMUSCULAR | Status: DC | PRN
  Filled 2012-05-11: qty 0.25

## 2012-05-11 MED ORDER — ONDANSETRON HCL 4 MG PO TABS: 4.0000 mg | ORAL_TABLET | Freq: Four times a day (QID) | ORAL | Status: DC | PRN

## 2012-05-11 MED ORDER — SODIUM CHLORIDE 0.9 % IV SOLN: INTRAVENOUS | Status: DC

## 2012-05-11 MED ORDER — BIMATOPROST 0.01 % OP SOLN
1.0000 [drp] | Freq: Every day | OPHTHALMIC | Status: DC
  Administered 2012-05-11: 1 [drp] via OPHTHALMIC
  Filled 2012-05-11: qty 2.5

## 2012-05-11 MED ORDER — HEPARIN SODIUM (PORCINE) 5000 UNIT/ML IJ SOLN
5000.0000 [IU] | Freq: Three times a day (TID) | INTRAMUSCULAR | Status: DC
  Administered 2012-05-11 (×3): 5000 [IU] via SUBCUTANEOUS
  Filled 2012-05-11 (×7): qty 1

## 2012-05-11 MED ORDER — SODIUM CHLORIDE 0.9 % IV SOLN
INTRAVENOUS | Status: DC
  Administered 2012-05-11: 200 mL/h via INTRAVENOUS
  Administered 2012-05-11: 05:00:00 via INTRAVENOUS

## 2012-05-11 NOTE — H&P (Signed)
Hospital Admission Note Date: 05/11/2012  Patient name: Brady Church Medical record number: [id number] Date of birth: June 05, 1961 Age: 51 y.o. Gender: male PCP: Lyn Hollingshead, MD  Medical Service: Internal Medicine Teaching Service - [name]  Attending physician: Dr. [name]    1st Contact: Dr. [name]   Pager: [phone] 2nd Contact: Dr. [name]   Pager: [phone] After 5 pm or weekends: 1st Contact:      Pager: [phone] 2nd Contact:      Pager: [phone]  Chief Complaint: Dizziness  History of Present Illness: Patient is a 51 yo man with history of DM & HTN who presents with acute onset dizziness since 3d PTA. Dizziness is described as feeling faint, not the room spinning around him.  3 days ago, he was working outside [employer] and started to feel light headed.  He sat down and reports his colleague noted that his eyes rolled back in his head.  Patient was able to hear what was happening, but could not respond.  He quickly recovered, and denies LOC or head trauma.  2 days PTA, he reports persistent dizziness that is somewhat worse with sitting up.  He reports visualizing spots as well.  He reports overall decreased PO intake.  He drank a beer, and subsequently vomited.  He had 3 episodes of nonbloody, nonbilious vomiting.  He was able to tolerate chicken noodle soup on the night PTA.  He has never had similar symptoms in the past. He came to the ED oh DOA because symptoms continued to persist without resolution, and he thought his diabetes was acting up.    Meds: Current Outpatient Rx  Name Route Sig Dispense Refill  . BIMATOPROST 0.03 % OP SOLN Both Eyes Place 1 drop into both eyes at bedtime.    Marland Kitchen BRIMONIDINE TARTRATE 0.2 % OP SOLN Both Eyes Place 1 drop into both eyes 2 (two) times daily.    . DORZOLAMIDE HCL 2 % OP SOLN Both Eyes Place 1 drop into both eyes 2 (two) times daily.    Marland Kitchen LISINOPRIL 20 MG PO TABS Oral Take 20 mg by mouth daily.    Marland Kitchen PRAVASTATIN SODIUM 40 MG PO TABS Oral Take 40  mg by mouth daily.      Allergies: Allergies as of 05/10/2012  . (No Known Allergies)   Past Medical History  Diagnosis Date  . Diabetes mellitus   . Hypertension   . Substance abuse     cocaine. Stopped in 2010  . High cholesterol    Past Surgical History  Procedure Date  . Cataract extraction 01/08/2012    Right eye   Family History  Problem Relation Age of Onset  . Coronary artery disease Mother 20    died of MI  . Cancer Father 35    Some GI cancer. Not sure if it was Colon Cancer or not.  . Hypertension Father   . Diabetes Maternal Grandmother    History   Social History  . Marital Status: Single    Spouse Name: [name]    Number of Children: [name]  . Years of Education: [name]   Occupational History  . Not on file.   Social History Main Topics  . Smoking status: Former Smoker    Quit date: 01/16/2006  . Smokeless tobacco: Not on file  . Alcohol Use: Yes     24 oz beer every other day  . Drug Use: No     Previous cocaine user. Stopped 2 years before.  Marland Kitchen Sexually Active:  Not on file   Other Topics Concern  . 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    Review of Systems: General: no fevers, chills, changes in weight, changes in appetite Skin: no rash HEENT: no blurry vision, hearing changes, sore throat Pulm: no dyspnea, coughing, wheezing CV: no chest pain, palpitations, shortness of breath Abd: no nausea/vomiting, diarrhea/constipation, but reports some mild left sided abdominal cramping on the day PTA that has since resolved GU: no dysuria, hematuria, polyuria Ext: no arthralgias, myalgias Neuro: no weakness, numbness, or tingling  Physical Exam: Blood pressure 127/74, pulse 65, temperature 97.6 F (36.4 C), temperature source Oral, resp. rate 18, SpO2 100.00%. on RA Orthostatic  vital signs: Supine: 130/90, HR 68   Standing: 138/86, HR 73 General: resting in bed, no acute distress, cooperative to exam HEENT: PERRL, EOMI, no scleral icterus, no conjunctival pallor, no nystagmus Cardiac: RRR, no rubs, murmurs or gallops Pulm: clear to auscultation bilaterally, moving normal volumes of air Abd: soft, nontender, nondistended, BS normoactive Ext: warm and well perfused, no pedal edema Neuro: alert and oriented X3, cranial nerves II-XII grossly intact   Lab results: Basic Metabolic Panel:  Basename 05/10/12 2132 05/10/12 2105  NA 140 138  K 3.8 4.1  CL 107 99  CO2 -- 17*  GLUCOSE 114* 115*  BUN 51* 55*  CREATININE 4.70* 4.49*  CALCIUM -- 10.1  MG -- --  PHOS -- --   Liver Function Tests:  [facility] 05/10/12 2105  AST 27  ALT 22  ALKPHOS 60  BILITOT 0.3  PROT 8.4*  ALBUMIN 4.7   CBC:  Basename 05/10/12 2132 05/10/12 2105  WBC -- 9.5  NEUTROABS -- 6.2  HGB 15.3 15.2  HCT 45.0 40.6  MCV -- 89.8  PLT -- 312   CBG:  Basename 05/10/12 1954  GLUCAP 123*    Other results: EKG: sinus brady (HR 59), no prior EKG  Assessment & Plan by Problem:  #Presyncope: Most likely related to dehydration and acute renal failure given history of working in the heat and decreased PO intake.  Given risk factors of HTN, DM, HLD, and male gender, cannot r/o MI causing an irregular rhythm, though EKG does not suggest MI at this time.  Stroke or intracranial etiology less likely given no focal deficits on exam.  No nystagmus on physical exam, and thus vertigo less likely. -Aggressively hydrate with NS -Cycle CEs  # Acute renal failure: Most likely pre renal etiology given history.  Patient's baseline Cr = 1.  Patient denies problems with urination, and thus post renal obstruction less likely.  Patient is on ACEI, which may contribute as well.  Acute intrinsic renal injury by infection or ATN less likely, but cannot be ruled out. -Aggressive IVF hydration (2 HS bolus  followed by 200cc/h) -Monitor Cr with AM labs -Urine lytes -UA / micro - monitor for casts/infection  # Type 2 diabetes mellitus: Patient's last HbA1c = 5.4 on 04/17/12. He is not on any medications for DM, and is diet controlled.  -Continue to monitor CBG with BMET  # Hypertension: Patient currently normotensive.  We will hold patient's Lisinopril 20mg  daily in setting of ARF. -Hydralazine prn given aggressive hydration  # Hyperlipidemia LDL goal < 100 : Patient's last LDL = 153 on 01/17/12.  He was started on a  statin on 03/22/12.  We will continue home dose statin (Pravastatin 40) -Repeat FLP since patient reports compliance with statin  # Glaucoma: Continue home opthalmic drops (Bimatoprost, Dorzolamide & Brimonidine)   Signed: KAPADIA, Willy Pinkerton 05/11/2012, 12:41 AM

## 2012-05-11 NOTE — Progress Notes (Addendum)
Addendum to admission note:  S: patient has no complaint, no nausea, or pain. Able to get up and walk around the room.  O:  Filed Vitals:   05/11/12 0846  BP: 135/68  Pulse: 85  Temp: 97.8 F (36.6 C)  Resp: 18  General: middle aged man resting in bed, somewhat flat affect HEENT: PERRL, EOMI, no scleral icterus Cardiac: RRR, no rubs, murmurs or gallops Pulm: clear to auscultation bilaterally, moving normal volumes of air Abd: soft, nontender, nondistended, BS present Ext: warm and well perfused, no pedal edema Neuro: alert and oriented X3, cranial nerves II-XII grossly intact  A/P:  Presyncope - has resolved with normal saline bolus -orthostatics negative this AM (laying 129/76 HR 59  sitting 127/74 HR 65  standing 119/76 HR 64)  Acute renal failure -creatinine is much improved with fluids -will consider discharge tomorrow if kidney function improves further back to baseline of 1.0 -reduced NS to 177mL/hr

## 2012-05-11 NOTE — H&P (Signed)
Internal Medicine Attending Admission Note Date: 05/11/2012  Patient name: Brady Church Medical record number: [id number] Date of birth: Mar 07, 1961 Age: 51 y.o. Gender: male  I saw and evaluated the patient. I reviewed the resident's note and I agree with the resident's findings and plan as documented in the resident's note, with additional comments as noted below.  Chief Complaint(s): Dizziness/near syncope  History - key components related to admission: Patient is a 51 year old man with a history of hypertension, diabetes mellitus, glaucoma, and other problems as outlined in the medical history admitted with complaint of dizziness.  His symptoms began at work and progressed over 2 days, and he had a near syncopal episode at work on the day of admission.  He reports that he works outside in the heat and had been sweating a lot prior to the onset of symptoms; he also reports that his oral intake was less than usual.  He denies fever, chills, sweats, chest pain, nausea, vomiting, abdominal pain, diarrhea, bright red blood per rectum, melena.   Physical Exam - key components related to admission:  Filed Vitals:   05/11/12 0125 05/11/12 0200 05/11/12 0606 05/11/12 0846  BP: 136/79 130/81 112/60 135/68  Pulse: 74 71 60 85  Temp: 98.3 F (36.8 C) 97.9 F (36.6 C) 97.7 F (36.5 C) 97.8 F (36.6 C)  TempSrc: Oral Oral Oral Oral  Resp: 17 20 18 18   Weight:  218 lb 4.1 oz (99 kg)    SpO2: 97% 98% 99% 97%   General: Alert, no distress Lungs: Clear Heart: Regular; S1-S2, no S3, no S4, no murmurs Abdomen: Bowel sounds present, soft, nontender; no hepatosplenomegaly Extremities: No edema   Lab results:   Basic Metabolic Panel:  Basename 05/11/12 0542 05/11/12 0224 05/10/12 2132 05/10/12 2105  NA 140 -- 140 --  K 3.5 -- 3.8 --  CL 106 -- 107 --  CO2 21 -- -- 17*  GLUCOSE 110* -- 114* --  BUN 49* -- 51* --  CREATININE 2.40* -- 4.70* --  CALCIUM 8.8 -- -- 10.1  MG -- 2.6* -- --    PHOS -- -- -- --    Liver Function Tests:  Basename 05/10/12 2105  AST 27  ALT 22  ALKPHOS 60  BILITOT 0.3  PROT 8.4*  ALBUMIN 4.7    CBC:  Basename 05/10/12 2132 05/10/12 2105  WBC -- 9.5  NEUTROABS -- 6.2  HGB 15.3 15.2  HCT 45.0 40.6  MCV -- 89.8  PLT -- 312    Cardiac Enzymes:  Basename 05/11/12 0900 05/11/12 0224 05/11/12 0002  CKTOTAL 427* 520* 542*  CKMB 3.6 4.6* --  CKMBINDEX -- -- --  TROPONINI <0.30 <0.30 --     Basename 05/10/12 1954  GLUCAP 123*     Fasting Lipid Panel:  Basename 05/11/12 0542  CHOL 164  HDL 38*  LDLCALC 101*  TRIG 126  CHOLHDL 4.3  LDLDIRECT --   Urinalysis    Component Value Date/Time   COLORURINE YELLOW 05/11/2012 0612   APPEARANCEUR CLEAR 05/11/2012 0612   LABSPEC 1.022 05/11/2012 0612   PHURINE 5.0 05/11/2012 0612   GLUCOSEU NEGATIVE 05/11/2012 0612   HGBUR NEGATIVE 05/11/2012 0612   BILIRUBINUR NEGATIVE 05/11/2012 0612   KETONESUR NEGATIVE 05/11/2012 0612   PROTEINUR NEGATIVE 05/11/2012 0612   UROBILINOGEN 0.2 05/11/2012 0612   NITRITE NEGATIVE 05/11/2012 0612   LEUKOCYTESUR NEGATIVE 05/11/2012 0612    Other results: EKG: Sinus bradycardia, rate 59  Assessment & Plan by Problem:  1.  Dizziness/presyncope likely due to volume depletion.  Patient's symptoms have resolved following initial normal saline IV volume replacement.  Plan is continue IV normal saline; follow orthostatics; monitor; serial enzymes, repeat EKG.  2.  Acute renal failure.  Likely due to volume depletion, with possible contribution from ACE inhibitor.  Renal function is improving with volume replacement.  Plan is to continue IV normal saline volume replacement; we have stopped the ACE inhibitor; follow BUN/creatinine.  3.  Other problems as per resident physician.

## 2012-05-12 LAB — BASIC METABOLIC PANEL
BUN: 19 mg/dL (ref 6–23)
Calcium: 8.7 mg/dL (ref 8.4–10.5)
GFR calc Af Amer: >90 mL/min (ref >90)
GFR calc non Af Amer: >90 mL/min (ref >90)
Glucose, Bld: 98 mg/dL (ref 70–99)
Potassium: 3.9 mEq/L (ref 3.5–5.1)
Sodium: 140 mEq/L (ref 135–145)

## 2012-05-12 NOTE — Discharge Summary (Signed)
Internal Medicine Teaching [facility] Discharge Note  Name: Brady Church MRN: [id number] DOB: Jul 15, 1961 51 y.o.  Date of Admission: 05/10/2012  7:48 PM Date of Discharge: 05/12/2012 Attending Physician: Farley Ly, MD  Discharge Diagnosis: 1. Acute renal failure- resolved.  Likely to volume depletion 2. Presyncope- likely due to volume depletion 3. Diabetes mellitus, type II  - hemoglobin A1c 5.4. Diet-controlled 4.Hypertension 5.Hyperlipidemia 6.Glaucoma    Discharge Medications: Medication List  As of 05/12/2012  2:47 PM   STOP taking these medications         lisinopril 20 MG tablet         TAKE these medications         bimatoprost 0.03 % ophthalmic solution   Commonly known as: LUMIGAN   Place 1 drop into both eyes at bedtime.      brimonidine 0.2 % ophthalmic solution   Commonly known as: ALPHAGAN   Place 1 drop into both eyes 2 (two) times daily.      dorzolamide 2 % ophthalmic solution   Commonly known as: TRUSOPT   Place 1 drop into both eyes 2 (two) times daily.      pravastatin 40 MG tablet   Commonly known as: PRAVACHOL   Take 40 mg by mouth daily.            Disposition and follow-up:   Mr.Brady Church was discharged from [facility] in Good condition.  At the hospital follow up visit please address : 1. Please obtain during the next office visit a basic metabolic panel to followup renal function. 2. Please evaluate patient's blood pressure since lisinopril was discontinued during this hospitalization in the setting of acute renal failure. Blood pressure during this hospitalization was 120-140s/70-90s  Follow-up Appointments:  Discharge Orders    Future Orders Please Complete By Expires   Diet Carb Modified      Increase activity slowly      Discharge instructions      Comments:   1. please stop taking her blood pressure medication Lisinopril. 2. please make keep himself hydrated especially if you are  working outdoors. 3. please call the clinic if you have any questions: [phone] to   Call MD for:  temperature >100.4      Call MD for:  persistant nausea and vomiting      Call MD for:  difficulty breathing, headache or visual disturbances      Call MD for:  persistant dizziness or light-headedness         Admission HPI:  Patient is a 51 yo man with history of DM & HTN who presents with acute onset dizziness since 3d PTA. Dizziness is described as feeling faint, not the room spinning around him. 3 days ago, he was working outside [employer] and started to feel light headed. He sat down and reports his colleague noted that his eyes rolled back in his head. Patient was able to hear what was happening, but could not respond. He quickly recovered, and denies LOC or head trauma. 2 days PTA, he reports persistent dizziness that is somewhat worse with sitting up. He reports visualizing spots as well. He reports overall decreased PO intake. He drank a beer, and subsequently vomited. He had 3 episodes of nonbloody, nonbilious vomiting. He was able to tolerate chicken noodle soup on the night PTA. He has never had similar symptoms in the past. He came to the ED oh DOA because symptoms continued to persist without resolution, and  he thought his diabetes was acting up.   Physical Exam:  Blood pressure 127/74, pulse 65, temperature 97.6 F (36.4 C), temperature source Oral, resp. rate 18, SpO2 100.00%. on RA  Orthostatic vital signs: Supine: 130/90, HR 68 Standing: 138/86, HR 73  General: resting in bed, no acute distress, cooperative to exam  HEENT: PERRL, EOMI, no scleral icterus, no conjunctival pallor, no nystagmus  Cardiac: RRR, no rubs, murmurs or gallops  Pulm: clear to auscultation bilaterally, moving normal volumes of air  Abd: soft, nontender, nondistended, BS normoactive  Ext: warm and well perfused, no pedal edema  Neuro: alert and oriented X3, cranial nerves II-XII grossly intact  Lab  results:  Basic Metabolic Panel:   Basename  05/10/12 2132  05/10/12 2105   NA  140  138   K  3.8  4.1   CL  107  99   CO2  --  17*   GLUCOSE  114*  115*   BUN  51*  55*   CREATININE  4.70*  4.49*   CALCIUM  --  10.1   MG  --  --   PHOS  --  --    Liver Function Tests:   [facility]  05/10/12 2105   AST  27   ALT  22   ALKPHOS  60   BILITOT  0.3   PROT  8.4*   ALBUMIN  4.7    CBC:   Basename  05/10/12 2132  05/10/12 2105   WBC  --  9.5   NEUTROABS  --  6.2   HGB  15.3  15.2   HCT  45.0  40.6   MCV  --  89.8   PLT  --  312    CBG:   Basename  05/10/12 1954   GLUCAP  123*    Other results:  EKG: sinus brady (HR 59), no prior EKG  Hospital Course by problem list:  1. Acute renal failure- resolved: Most likely pre renal etiology given history and resolution with IV fluids only. ACE inhibitor may have contributed in the setting of volume depletion. Therefore ACE inhibitor was discontinued during hospitalization and was not restarted. Would not recommend to restart it in the future.  Furthermore a diuretic she should not be initiated since patient has history of decreased fluid intake. Consider other antihypertensive medication including Norvasc.Urine analysis was negative for pyuria.  Patient will followup in the outpatient clinic for repeat basic metabolic panel in one week.  2. Presyncope: Most likely related to dehydration and acute renal failure given history of working in the heat and decreased PO intake. Cardiac enzymes were negative x3. EKG is not showing any acute changes. Telemetry did not show any arrhythmias. Patient did not experience any dizziness on day of discharge. Orthostatic blood pressures were within normal limits.   # Type 2 diabetes mellitus: Patient's last HbA1c = 5.4 on 04/17/12. Diet controlled  # Hypertension:  Blood pressure during this hospitalization was 120-140s/70-90s. His enalapril was discontinued in the setting of acute renal failure was not  restarted on day of discharge. Would not recommend to restart in the future. Consider other blood pressure medication including Norvasc if blood pressure is elevated.   # Hyperlipidemia LDL goal < 100 :  Cholesterol 164, triglyceride 126, HDL 38, LDL 101. Continue pravastatin 40 mg daily  # Glaucoma: Continue home opthalmic drops (Bimatoprost, Dorzolamide & Brimonidine)   Discharge Vitals:  BP 166/96  Pulse 66  Temp 98.1 F (36.7 C) (  Oral)  Resp 18  Wt 220 lb 7.4 oz (100 kg)  SpO2 98%  Discharge Labs:  Results for orders placed during the hospital encounter of 05/10/12 (from the past 24 hour(s))  BASIC METABOLIC PANEL     Status: Normal   Collection Time   05/12/12  5:47 AM      Component Value Range   Sodium 140  135 - 145 mEq/L   Potassium 3.9  3.5 - 5.1 mEq/L   Chloride 109  96 - 112 mEq/L   CO2 22  19 - 32 mEq/L   Glucose, Bld 98  70 - 99 mg/dL   BUN 19  6 - 23 mg/dL   Creatinine, Ser 1.61  0.50 - 1.35 mg/dL   Calcium 8.7  8.4 - 09.6 mg/dL   GFR calc non Af Amer >90  >90 mL/min   GFR calc Af Amer >90  >90 mL/min  CK     Status: Abnormal   Collection Time   05/12/12  5:47 AM      Component Value Range   Total CK 268 (*) 7 - 232 U/L    Signed: Basya Casavant 05/12/2012, 2:47 PM   Time Spent on Discharge: 30 min

## 2012-05-12 NOTE — Progress Notes (Signed)
Subjective:  Patient feels well. Denies any chest pain, SOB, abdominal pain.  No events over night.   Objective: Vital signs in last 24 hours: Filed Vitals:   05/11/12 1343 05/11/12 1701 05/11/12 2133 05/12/12 0544  BP: 126/72 125/74 130/75 130/80  Pulse: 66 66 66 73  Temp: 98.5 F (36.9 C) 99 F (37.2 C) 98.2 F (36.8 C) 97.9 F (36.6 C)  TempSrc: Oral Oral Oral Oral  Resp: 18 18 20 18   Weight:   220 lb 7.4 oz (100 kg)   SpO2: 99% 98% 98% 97%   Weight change: 2 lb 3.3 oz (1 kg)  Intake/Output Summary (Last 24 hours) at 05/12/12 0744 Last data filed at 05/12/12 1610  Gross per 24 hour  Intake   4895 ml  Output      0 ml  Net   4895 ml   Physical Exam: Constitutional: Vital signs reviewed.  Patient is a well-developed and well-nourished  in no acute distress and cooperative with exam. Alert and oriented x3.  Cardiovascular: RRR, S1 normal, S2 normal, no MRG, pulses symmetric and intact bilaterally Pulmonary/Chest: CTAB, no wheezes, rales, or rhonchi Abdominal: Soft. Non-tender, non-distended, bowel sounds are normal  Neurological: A&O x3, .     Lab Results: Basic Metabolic Panel:  Lab 05/12/12 9604 05/11/12 0542 05/11/12 0224  NA 140 140 --  K 3.9 3.5 --  CL 109 106 --  CO2 22 21 --  GLUCOSE 98 110* --  BUN 19 49* --  CREATININE 0.93 2.40* --  CALCIUM 8.7 8.8 --  MG -- -- 2.6*  PHOS -- -- --   Liver Function Tests:  Lab 05/10/12 2105  AST 27  ALT 22  ALKPHOS 60  BILITOT 0.3  PROT 8.4*  ALBUMIN 4.7   CBC:  Lab 05/10/12 2132 05/10/12 2105  WBC -- 9.5  NEUTROABS -- 6.2  HGB 15.3 15.2  HCT 45.0 40.6  MCV -- 89.8  PLT -- 312   Cardiac Enzymes:  Lab 05/12/12 0547 05/11/12 1200 05/11/12 0900 05/11/12 0224  CKTOTAL 268* 382* 427* --  CKMB -- 3.3 3.6 4.6*  CKMBINDEX -- -- -- --  TROPONINI -- <0.30 <0.30 <0.30   CBG:  Lab 05/10/12 1954  GLUCAP 123*   Fasting Lipid Panel:  Lab 05/11/12 0542  CHOL 164  HDL 38*  LDLCALC 101*  TRIG 126    CHOLHDL 4.3  LDLDIRECT --   Urine Drug Screen: Drugs of Abuse     Component Value Date/Time   LABOPIA NEG 07/21/2009 2043   COCAINSCRNUR NEG 07/21/2009 2043   LABBENZ NEG 07/21/2009 2043   AMPHETMU NEG 07/21/2009 2043    Urinalysis:  Lab 05/11/12 0612  COLORURINE YELLOW  LABSPEC 1.022  PHURINE 5.0  GLUCOSEU NEGATIVE  HGBUR NEGATIVE  BILIRUBINUR NEGATIVE  KETONESUR NEGATIVE  PROTEINUR NEGATIVE  UROBILINOGEN 0.2  NITRITE NEGATIVE  LEUKOCYTESUR NEGATIVE    Medications: I have reviewed the patient's current medications. Scheduled Meds:   . bimatoprost  1 drop Both Eyes QHS  . brimonidine  1 drop Both Eyes BID  . docusate sodium  100 mg Oral BID  . dorzolamide  1 drop Both Eyes BID  . folic acid  1 mg Oral Daily  . heparin  5,000 Units Subcutaneous Q8H  . simvastatin  20 mg Oral q1800  . thiamine  100 mg Oral Daily   Continuous Infusions:   . sodium chloride 100 mL/hr at 05/12/12 0118  . DISCONTD: sodium chloride 200 mL/hr (05/11/12 0949)  PRN Meds:.acetaminophen, acetaminophen, hydrALAZINE, ondansetron (ZOFRAN) IV, ondansetron Assessment/Plan:  #Presyncope - Likely in the setting of dehydration. Resolved.  - has resolved with normal saline bolus   # Acute renal failure - resolved. Likely in the setting of volume depletion   #Hypertension- max SBP 130s. Patient was on Lisinopril. Will not restart on day of discharge.  - Will follow up with outpatient clinic and may start Norvasc .   #dispo: d/c home today     LOS: 2 days   Brady Church 05/12/2012, 7:44 AM

## 2012-05-15 ENCOUNTER — Encounter: Payer: Self-pay | Admitting: Internal Medicine

## 2012-05-29 ENCOUNTER — Encounter: Payer: Self-pay | Admitting: Internal Medicine

## 2012-05-29 ENCOUNTER — Ambulatory Visit (INDEPENDENT_AMBULATORY_CARE_PROVIDER_SITE_OTHER): Payer: Self-pay | Admitting: Internal Medicine

## 2012-05-29 VITALS — BP 128/81 | HR 67 | Temp 98.2°F | Ht 68.0 in | Wt 226.6 lb

## 2012-05-29 DIAGNOSIS — R21 Rash and other nonspecific skin eruption: Secondary | ICD-10-CM

## 2012-05-29 DIAGNOSIS — I10 Essential (primary) hypertension: Secondary | ICD-10-CM

## 2012-05-29 DIAGNOSIS — L709 Acne, unspecified: Secondary | ICD-10-CM

## 2012-05-29 DIAGNOSIS — Z8639 Personal history of other endocrine, nutritional and metabolic disease: Secondary | ICD-10-CM

## 2012-05-29 DIAGNOSIS — N179 Acute kidney failure, unspecified: Secondary | ICD-10-CM

## 2012-05-29 DIAGNOSIS — L708 Other acne: Secondary | ICD-10-CM

## 2012-05-29 NOTE — Assessment & Plan Note (Signed)
Blood pressure well controlled without any blood pressure medications. Recent admission for AKI as above. Will hold off all blood pressure pills until next visit. He will check his blood pressure at [facility] or [facility] or [facility] when he goes there and report of his blood pressure stays more than 140/90. I will consider starting HCTZ at 12.5 mg daily for Lopressor 12.5 mg daily if needed. I will see him back in 2-3 months as he didn't want to come earlier as is starting a new job soon- also I requested him to come in 3-4 weeks- but he says that he recheck his blood pressure outside and report [place] if its elevated.

## 2012-05-29 NOTE — Assessment & Plan Note (Signed)
Right lower abdomen- dry scaly rash. No redness or discharge or ulcer.  No signs of infection.  Most likely dry skin from sweating under skin folds. - Advised to apply Vaseline or petroleum gel 2 to 3 times a day.

## 2012-05-29 NOTE — Progress Notes (Signed)
  Subjective:    Patient ID: Brady Church, male    DOB: [birth year], 51 y.o.   MRN: [id number]  HPI Mr. Moris is a pleasant 78 -year-old man with past history of hyperlipidemia, glaucoma, history of type II DM 2 who comes to the clinic for a hospital followup visit. He was admitted to hospital on 05/10/2012 2 05/12/2012 for acute renal failure secondary to dehydration and concomitant ACE inhibitor use. His creatinine normalized from 4 to 0.93 at discharge. His lisinopril was discontinued at discharge- and he is not taking it now. His blood pressure today is 128/81 which is at goal without being on any blood pressure pills last 2 weeks. He feels fine and has no complaints at present in terms of chest pain, short of breath, dizziness, headache, palpitations. He does complain of rash on his right lower abdomen and just below the skin fold- which is brown scaly rash- no itching, no redness or discharge. Also has acne like her options on face bilaterally and on the neck. He did have acne before- and says that it is getting worse in summer under heat. He has been working out in sun more lately- and that's when he gets worse. Denies any fever, chills, nausea vomiting or abdominal pain, diarrhea.    Review of Systems    as per history of present illness, all other systems reviewed and negative. Objective:   Physical Exam  General: resting in bed HEENT: PERRL, EOMI, no scleral icterus Cardiac: RRR, no rubs, murmurs or gallops Pulm: clear to auscultation bilaterally, moving normal volumes of air Abd: soft, nontender, nondistended, BS present Ext: warm and well perfused, no pedal edema Skin: Brown scaly dry rash on right lower abdomen- under skin fold. Not itchy. No discharge or ulcers. Also has acne letter options with white pustular papules on face and neck. Neuro: alert and oriented X3, cranial nerves II-XII grossly intact       Assessment & Plan:

## 2012-05-29 NOTE — Assessment & Plan Note (Signed)
Acne-like Eruptions with pustules on face and neck.  Patient has had acne before and feels it's similar. Has oily skin at present.   Plan: Wash face 4-5 times a day with water and soap. - Would not pursue antibiotics treatment at present. - Advised him to call clinic if eruptions get worse with watchful waiting and treatment with face wash.

## 2012-05-29 NOTE — Assessment & Plan Note (Signed)
Recent hospital admission for AKI likely due to dehydration and concomitant ACE inhibitor use. Lisinopril were stopped during hospital stay and was discontinued at discharge. Patient not taking it anymore. Blood pressure at goal without any blood pressure pills at present.  Plan: Recheck BMP today to assess normalized creatinine. - Will start beta blocker or HCTZ for hypertension if needed in future.

## 2012-05-29 NOTE — Patient Instructions (Signed)
Please make a follow up appointment in 2-3 months. I will call you if your lab test is abnormal.  Wash your face 4-5 times a day with water and see if that helps your acne. Apply vaseline or petroleum gel on belly rash under the skin 2-3 times a day.  Check your Blood Pressure when you go to [facility] or [facility] or [facility] and call clinic if it stays more than 140/90.

## 2012-05-30 LAB — BASIC METABOLIC PANEL WITH GFR
CO2: 27 mEq/L (ref 19–32)
Calcium: 9 mg/dL (ref 8.4–10.5)
Creat: 0.94 mg/dL (ref 0.50–1.35)
GFR, Est African American: >89 mL/min
GFR, Est Non African American: >89 mL/min
Glucose, Bld: 87 mg/dL (ref 70–99)
Sodium: 143 mEq/L (ref 135–145)

## 2012-08-28 ENCOUNTER — Encounter: Payer: Self-pay | Admitting: Internal Medicine

## 2012-09-11 ENCOUNTER — Encounter: Payer: Self-pay | Admitting: Internal Medicine

## 2012-09-11 ENCOUNTER — Ambulatory Visit (INDEPENDENT_AMBULATORY_CARE_PROVIDER_SITE_OTHER): Payer: Self-pay | Admitting: Internal Medicine

## 2012-09-11 VITALS — BP 174/103 | HR 64 | Temp 98.2°F | Ht 68.0 in | Wt 222.0 lb

## 2012-09-11 DIAGNOSIS — E785 Hyperlipidemia, unspecified: Secondary | ICD-10-CM

## 2012-09-11 DIAGNOSIS — Z202 Contact with and (suspected) exposure to infections with a predominantly sexual mode of transmission: Secondary | ICD-10-CM

## 2012-09-11 DIAGNOSIS — Z8639 Personal history of other endocrine, nutritional and metabolic disease: Secondary | ICD-10-CM

## 2012-09-11 DIAGNOSIS — Z2089 Contact with and (suspected) exposure to other communicable diseases: Secondary | ICD-10-CM

## 2012-09-11 DIAGNOSIS — I10 Essential (primary) hypertension: Secondary | ICD-10-CM

## 2012-09-11 MED ORDER — HYDROCHLOROTHIAZIDE 12.5 MG PO CAPS: 12.5000 mg | ORAL_CAPSULE | Freq: Every day | ORAL | Status: DC

## 2012-09-11 MED ORDER — HYDROCHLOROTHIAZIDE 25 MG PO TABS: 25.0000 mg | ORAL_TABLET | Freq: Every day | ORAL | Status: DC

## 2012-09-11 NOTE — Patient Instructions (Addendum)
Please make followup appointment in 3-4 weeks. I will give you a call if any of the lab test done today are abnormal. Give [place] a call for early appointment if needed.

## 2012-09-11 NOTE — Assessment & Plan Note (Signed)
Continue Pravachol. CMP within normal limits during last visit. Last LDL 101.

## 2012-09-11 NOTE — Assessment & Plan Note (Signed)
After discussing in detail with Dr. Kem Kays, decided to check urine for gonorrhea, Chlamydia and Trichomonas. Also will check HIV antibody now and will consider recheck in 6 months - for the window period. Patient does not have any symptoms at present. Discussed with him that I will give him a call if any of the lab test shows any abnormalities. He verbalized understanding. Discussed with him about importance of using condoms or other barrier methods of contraception protection.

## 2012-09-11 NOTE — Assessment & Plan Note (Signed)
Lab Results  Component Value Date   NA 143 05/29/2012   K 3.8 05/29/2012   CL 107 05/29/2012   CO2 27 05/29/2012   BUN 13 05/29/2012   CREATININE 0.94 05/29/2012   CREATININE 0.93 05/12/2012    BP Readings from Last 3 Encounters:  09/11/12 174/103  05/29/12 128/81  05/12/12 166/96    Assessment: Hypertension control:  moderately elevated  Progress toward goals:  deteriorated Barriers to meeting goals:  no barriers identified  Plan: Hypertension treatment:  Start him on HCTZ 25 mg daily. We'll consider adding CCB or beta blocker during next visit if blood pressure still high. discussed this with Dr. Kem Kays.

## 2012-09-11 NOTE — Assessment & Plan Note (Signed)
Last A1c 5.5. Diet-controlled. Will check finger stick glucose per patient's request. No need for A1c checked today. Patient denies any polyuria, polydipsia.

## 2012-09-11 NOTE — Progress Notes (Signed)
  Subjective:    Patient ID: Brady Church, male    DOB: May 08, 1961, 51 y.o.   MRN: [id number]  HPI Mr. Guttman is a pleasant 51 year old man with past history of hypertension, hyperlipidemia who comes the clinic for followup visit. I saw him back in July 2013 after hospital admission for lisinopril and dehydration induced AKI. His blood pressure was at goal without any medications for 2 weeks at that time and so I offered him to come back in 3-4 weeks to get blood pressure rechecked and start medications if needed- which he could not do because he started a new job. So he comes back today after 3 months. His blood pressure today is 174/103. He is not on any blood pressure medications at present. He denies any symptoms of chest pain, short of breath, nausea, vomiting, headache, palpitations, vision changes, syncope. He denies any fever, chills, abdominal pain.  She reports being exposed to some STD via unprotected sex with his sexual partner about a month before. She was diagnosed with some STD which she did not tell him and wanted him to get checked. He denies any urethral discharge, burning, pain, UTI symptoms. No penile ulcers.      Review of Systems    As per history of present illness, all other systems reviewed and negative. Objective:   Physical Exam  Constitutional: Vital signs reviewed.  Patient is a well-developed and well-nourished in no acute distress and cooperative with exam. Alert and oriented x3.  Head: Normocephalic and atraumatic Mouth: no erythema or exudates, MMM Eyes: PERRL, EOMI, conjunctivae normal, No scleral icterus.  Neck: Supple, Trachea midline normal ROM, No JVD Cardiovascular: RRR, S1 normal, S2 normal, no MRG, pulses symmetric and intact bilaterally Pulmonary/Chest: CTAB, no wheezes, rales, or rhonchi Abdominal: Soft. Non-tender, non-distended, bowel sounds are normal Musculoskeletal: No joint deformities, erythema, or stiffness, ROM full and no  nontender Neurological: A&O x3, Strength is normal and symmetric bilaterally, cranial nerve II-XII are grossly intact, no focal motor deficit, sensory intact to light touch bilaterally.  Skin: Warm, dry and intact. No rash, cyanosis, or clubbing.  Psychiatric: Normal mood and affect. speech and behavior is normal. Judgment and thought content normal. Cognition and memory are normal.         Assessment & Plan:

## 2012-10-09 ENCOUNTER — Encounter: Payer: Self-pay | Admitting: Internal Medicine

## 2012-10-16 ENCOUNTER — Encounter: Payer: Self-pay | Admitting: Internal Medicine

## 2013-06-05 ENCOUNTER — Other Ambulatory Visit: Payer: Self-pay

## 2013-07-18 ENCOUNTER — Encounter: Payer: Self-pay | Admitting: Internal Medicine

## 2013-10-31 ENCOUNTER — Encounter: Payer: Self-pay | Admitting: Internal Medicine

## 2013-12-26 ENCOUNTER — Encounter: Payer: Self-pay | Admitting: Internal Medicine

## 2014-01-23 ENCOUNTER — Ambulatory Visit (INDEPENDENT_AMBULATORY_CARE_PROVIDER_SITE_OTHER): Payer: Self-pay | Admitting: Internal Medicine

## 2014-01-23 VITALS — BP 156/102 | HR 80 | Temp 97.6°F | Ht 68.0 in | Wt 243.2 lb

## 2014-01-23 DIAGNOSIS — Z8639 Personal history of other endocrine, nutritional and metabolic disease: Secondary | ICD-10-CM

## 2014-01-23 DIAGNOSIS — Z0000 Encounter for general adult medical examination without abnormal findings: Secondary | ICD-10-CM | POA: Insufficient documentation

## 2014-01-23 DIAGNOSIS — E785 Hyperlipidemia, unspecified: Secondary | ICD-10-CM

## 2014-01-23 DIAGNOSIS — I10 Essential (primary) hypertension: Secondary | ICD-10-CM

## 2014-01-23 DIAGNOSIS — H409 Unspecified glaucoma: Secondary | ICD-10-CM

## 2014-01-23 DIAGNOSIS — Z299 Encounter for prophylactic measures, unspecified: Secondary | ICD-10-CM

## 2014-01-23 DIAGNOSIS — E119 Type 2 diabetes mellitus without complications: Secondary | ICD-10-CM

## 2014-01-23 MED ORDER — AMLODIPINE BESYLATE 5 MG PO TABS: 5.0000 mg | ORAL_TABLET | Freq: Every day | ORAL | Status: DC

## 2014-01-23 MED ORDER — LOVASTATIN 20 MG PO TABS: 20.0000 mg | ORAL_TABLET | Freq: Every day | ORAL | Status: DC

## 2014-01-23 NOTE — Progress Notes (Signed)
Patient ID: Milana Naerry Tuckey, male   DOB: 25-Jun-1961, 53 y.o.   MRN: [id number]   Subjective:   Patient ID: Milana Naerry Niznik male   DOB: 25-Jun-1961 53 y.o.   MRN: [id number]  HPI: Mr.Karma Justice BritainHarrelson is a 53 y.o. man with history of HTN, HL, DM2 in past (A1C 5.4% in 03/2012), glaucoma who presents for routine follow-up.  He has not been seen in ~1.5 years.   Patient is doing well, no complaints other than his discolored fingernails on left 3rd and 4th fingers.  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   BP (especially diastolic) elevated at 156/102, repeat 140/100 today.  Patient has not taken HCTZ in quite some time due to financial constraints and because "I didn't think I needed it anymore."   No headache, chest pain, nausea, dizziness.  He has also not been taking pravastatin for quite some time.    ROS negative (see below).  He does smoking intermittently if he has had a couple of beers.   He is due for many tests today which unfortunately we are not able to complete due to financial constraints given his lack of insurance.  He is willing to meet with Chauncey Reading[place] to apply for orange card and states he will do so prior to his next visit.  Patient did recently get a part time job.    Past Medical History  Diagnosis Date  . Diabetes mellitus   . Hypertension   . Substance abuse     cocaine. Stopped in 2010  . High cholesterol    Current Outpatient Prescriptions  Medication Sig Dispense Refill  . bimatoprost (LUMIGAN) 0.03 % ophthalmic solution Place 1 drop into both eyes at bedtime.      . brimonidine (ALPHAGAN) 0.2 % ophthalmic solution Place 1 drop into both eyes 2 (two) times daily.      . dorzolamide (TRUSOPT) 2 % ophthalmic solution Place 1 drop into both eyes 2 (two) times daily.      Marland Kitchen. amLODipine (NORVASC) 5 MG tablet Take 1 tablet (5 mg total) by mouth daily.  30 tablet  11  . lovastatin  (MEVACOR) 20 MG tablet Take 1 tablet (20 mg total) by mouth daily.  30 tablet  11   No current facility-administered medications for this visit.   Family History  Problem Relation Age of Onset  . Coronary artery disease Mother [id number]    died of MI  . Cancer Father [id number]    Some GI cancer. Not sure if it was Colon Cancer or not.  . Hypertension Father   . Diabetes Maternal Grandmother    History   Social History  . Marital Status: Single    Spouse Name: [name]    Number of Children: [name]  . Years of Education: [name]   Social History Main Topics  . Smoking status: Former Smoker    Quit date: 01/16/2006  . Smokeless tobacco: Not on file  . Alcohol Use: Yes     Comment: 24 oz beer every other day  . Drug Use: No     Comment: Previous cocaine user. Stopped 2 years before.  Marland Kitchen. Sexual Activity: Not on file   Other Topics Concern  . Not on file   Social History Narrative   [place] last [place] years with "friend"   Is not married and does not have kids.  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   Review of Systems: Review of Systems  Constitutional: Negative for fever and malaise/fatigue.  Eyes: Negative for blurred vision.  Respiratory: Negative for cough and shortness of breath.   Cardiovascular: Negative for chest pain.  Gastrointestinal: Negative for nausea, vomiting, abdominal pain, diarrhea and constipation.  Genitourinary: Negative for dysuria.  Musculoskeletal: Negative for falls.  Neurological: Negative for dizziness, loss of consciousness, weakness and headaches.    Objective:  Physical Exam: Filed Vitals:   01/23/14 1347  BP: 156/102  Pulse: 80  Temp: 97.6 F (36.4 C)  TempSrc: Oral  Height: 5\' 8"  (1.727 m)  Weight: 243 lb 3.2 oz (110.315 kg)  SpO2: 98%   General: alert, cooperative, and in no apparent distress HEENT: NCAT, vision grossly intact, oropharynx clear and  non-erythematous  Neck: supple, no lymphadenopathy Lungs: clear to ascultation bilaterally, normal work of respiration, no wheezes, rales, ronchi Heart: regular rate and rhythm, no murmurs, gallops, or rubs Abdomen: soft, non-tender, non-distended, normal bowel sounds Extremities: bruised 3rd-5th fingernails on left hand, 2+ DP/PT pulses bilaterally, no cyanosis, clubbing, or edema Neurologic: alert & oriented X3, cranial nerves II-XII intact, strength grossly intact, sensation intact to light touch  Assessment & Plan:  Patient discussed with Dr. Dalphine Handing.  Please see problem-based assessment and plan.

## 2014-01-23 NOTE — Assessment & Plan Note (Signed)
A1C 5.4% in 03/2012.  Would like to repeat A1C as well as check urine microalbumin:creatinine but will wait until patient gets orange card.  Patient already has eye exam scheduled on 3/11.

## 2014-01-23 NOTE — Assessment & Plan Note (Signed)
Patient taking Lumigan, Alphagan, and Trusopt gtts.  Has follow-up appointment with Dr. Dione Booze on 3/11.

## 2014-01-23 NOTE — Assessment & Plan Note (Signed)
Patient due for flu shot, colonoscopy but will not proceed at this time due to financial constraints given lack of insurance.  He plans to meet with Chauncey Reading to see about getting orange card prior to follow-up visit.

## 2014-01-23 NOTE — Patient Instructions (Addendum)
Follow-up in 3 weeks.     We are prescribing you a blood pressure medicine called AMLODIPINE and a cholesterol medicine called LOVASTATIN today.  They will be available for you to pick up at [facility].   We are giving you a blood pressure log today.  If you can, try to check it at your local pharmacy twice per week and write down the numbers then bring the log back to your next visit.   Please be sure to meet with Chauncey Reading before your next visit.  We would like to do some other lab testing but are limited until you have orange card.   Hypertension Hypertension is another name for high blood pressure. High blood pressure may mean that your heart needs to work harder to pump blood. Blood pressure consists of two numbers, which includes a higher number over a lower number (example: 110/72). HOME CARE   Make lifestyle changes as told by your doctor. This may include weight loss and exercise.  Take your blood pressure medicine every day.  Limit how much salt you use.  Stop smoking if you smoke.  Do not use drugs.  Talk to your doctor if you are using decongestants or birth control pills. These medicines might make blood pressure higher.  Females should not drink more than 1 alcoholic drink per day. Males should not drink more than 2 alcoholic drinks per day.  See your doctor as told. GET HELP RIGHT AWAY IF:   You have a blood pressure reading with a top number of 180 or higher.  You get a very bad headache.  You get blurred or changing vision.  You feel confused.  You feel weak, numb, or faint.  You get chest or belly (abdominal) pain.  You throw up (vomit).  You cannot breathe very well. MAKE SURE YOU:   Understand these instructions.  Will watch your condition.  Will get help right away if you are not doing well or get worse. Document Released: 05/01/2008 Document Revised: 02/05/2012 Document Reviewed: 05/01/2008 [facility] Patient Information 2014 [place], [place].

## 2014-01-23 NOTE — Addendum Note (Signed)
Addended by: Remus Blake on: 01/23/2014 04:37 PM   Modules accepted: Orders

## 2014-01-23 NOTE — Assessment & Plan Note (Signed)
Patient has not been complaint with pravastatin.  Sent in rx for lovastatin 20 mg daily (due to financial constraints) as patient falls into ASCVD risk group 4 with 10 year risk score of 14.1%.

## 2014-01-23 NOTE — Assessment & Plan Note (Addendum)
BP 156/102, repeat 140/100 today.  Patient has not been complaint with HCTZ that he was prescribed in 08/2012.  Sent in rx for amlodipine 5 mg daily.  Chose this instead of HCTZ for two reasons: patient seemed more interested in taking something different than what he had been on before, and concern for starting HCTZ and not being able to complete BMP at next visit if patient still does not have orange card.  Provided BP log and asked patient to check his BP at local pharmacy twice per week and write down values then bring log in to next visit.  Follow-up in 3 weeks.

## 2014-01-26 NOTE — Progress Notes (Signed)
Case discussed with Dr. Rogers at the time of the visit.  We reviewed the resident's history and exam and pertinent patient test results.  I agree with the assessment, diagnosis, and plan of care documented in the resident's note. 

## 2014-01-27 ENCOUNTER — Telehealth: Payer: Self-pay | Admitting: *Deleted

## 2014-01-27 NOTE — Telephone Encounter (Signed)
Pt calls and states the new med you prescribed is $16.00 and he cannot afford that, he wants something for $4.00. Ph# [phone]

## 2014-01-28 ENCOUNTER — Other Ambulatory Visit: Payer: Self-pay | Admitting: Internal Medicine

## 2014-01-28 MED ORDER — HYDROCHLOROTHIAZIDE 12.5 MG PO TABS: 12.5000 mg | ORAL_TABLET | Freq: Every day | ORAL | Status: DC

## 2014-01-28 NOTE — Telephone Encounter (Signed)
Will call in HCTA 12.5 mg daily instead.  Thanks

## 2014-02-19 ENCOUNTER — Encounter: Payer: Self-pay | Admitting: Internal Medicine

## 2014-04-17 ENCOUNTER — Encounter: Payer: Self-pay | Admitting: Internal Medicine

## 2014-07-23 ENCOUNTER — Encounter: Payer: Self-pay | Admitting: Internal Medicine

## 2014-09-15 ENCOUNTER — Other Ambulatory Visit: Payer: Self-pay | Admitting: *Deleted

## 2014-09-15 DIAGNOSIS — I10 Essential (primary) hypertension: Secondary | ICD-10-CM

## 2014-09-15 MED ORDER — HYDROCHLOROTHIAZIDE 12.5 MG PO TABS: 12.5000 mg | ORAL_TABLET | Freq: Every day | ORAL | Status: DC

## 2014-09-15 NOTE — Telephone Encounter (Signed)
Pt has an appt 10/07/14.

## 2014-09-15 NOTE — Telephone Encounter (Signed)
I refilled patient's HCTZ 12.5 mg daily, but patient has not been seen since 12/2013 and should come in for BP recheck since BP was not previously controlled. Note from last visit:  "Patient has not been complaint with HCTZ that he was prescribed in 08/2012. Sent in rx for amlodipine 5 mg daily. Chose this instead of HCTZ for two reasons: patient seemed more interested in taking something different than what he had been on before, and concern for starting HCTZ and not being able to complete BMP at next visit if patient still does not have orange card. "  However, patient could not afford amlodipine and a refill for HCTZ was sent in.

## 2014-10-07 ENCOUNTER — Encounter: Payer: Self-pay | Admitting: Internal Medicine

## 2014-10-07 ENCOUNTER — Ambulatory Visit (INDEPENDENT_AMBULATORY_CARE_PROVIDER_SITE_OTHER): Payer: Self-pay | Admitting: Internal Medicine

## 2014-10-07 VITALS — BP 165/106 | HR 84 | Temp 98.1°F | Ht 68.0 in | Wt 236.7 lb

## 2014-10-07 DIAGNOSIS — Z8639 Personal history of other endocrine, nutritional and metabolic disease: Secondary | ICD-10-CM

## 2014-10-07 DIAGNOSIS — F17200 Nicotine dependence, unspecified, uncomplicated: Secondary | ICD-10-CM

## 2014-10-07 DIAGNOSIS — Z720 Tobacco use: Secondary | ICD-10-CM

## 2014-10-07 DIAGNOSIS — E785 Hyperlipidemia, unspecified: Secondary | ICD-10-CM

## 2014-10-07 DIAGNOSIS — Z87891 Personal history of nicotine dependence: Secondary | ICD-10-CM | POA: Insufficient documentation

## 2014-10-07 DIAGNOSIS — I10 Essential (primary) hypertension: Secondary | ICD-10-CM

## 2014-10-07 HISTORY — DX: Nicotine dependence, unspecified, uncomplicated: F17.200

## 2014-10-07 LAB — POCT GLYCOSYLATED HEMOGLOBIN (HGB A1C): HEMOGLOBIN A1C: 5.5

## 2014-10-07 LAB — GLUCOSE, CAPILLARY: Glucose-Capillary: 90 mg/dL (ref 70–99)

## 2014-10-07 MED ORDER — HYDROCHLOROTHIAZIDE 12.5 MG PO TABS: 12.5000 mg | ORAL_TABLET | Freq: Every day | ORAL | Status: DC

## 2014-10-07 MED ORDER — LOVASTATIN 20 MG PO TABS: 20.0000 mg | ORAL_TABLET | Freq: Every day | ORAL | Status: DC

## 2014-10-07 NOTE — Patient Instructions (Signed)
General Instructions:   Please bring your medicines with you each time you come to clinic.  Medicines may include prescription medications, over-the-counter medications, herbal remedies, eye drops, vitamins, or other pills.  Please take your Hydrochlorothiazide 12.5 mg 1 pill daily and lovastatin 20 mg 1 pill daily. Please return in one month for a blood pressure recheck and lab check.  Please make attempts to cut back your smoking. I know you can do it!    Self Care Goals & Plans:  Self Care Goal 10/07/2014  Manage my medications take my medicines as prescribed; bring my medications to every visit; refill my medications on time  Eat healthy foods eat more vegetables; eat foods that are low in salt; eat baked foods instead of fried foods  Be physically active find an activity I enjoy  Stop smoking set a quit date and stop smoking; cut down the number of cigarettes smoked

## 2014-10-07 NOTE — Assessment & Plan Note (Signed)
Refilled lovastatin 20 mg daily. 

## 2014-10-07 NOTE — Assessment & Plan Note (Signed)
BP Readings from Last 3 Encounters:  10/07/14 165/106  01/23/14 156/102  09/11/12 174/103    Lab Results  Component Value Date   NA 143 05/29/2012   K 3.8 05/29/2012   CREATININE 0.94 05/29/2012    Assessment: Blood pressure control:  Uncontrolled Progress toward BP goal:   Deteriorated Comments: Patient has not taken his blood pressure medications in one month.  Plan: Medications:  continue current medications Educational resources provided:   Self management tools provided:   Other plans: Return in one month for blood pressure recheck

## 2014-10-07 NOTE — Assessment & Plan Note (Signed)
Lab Results  Component Value Date   HGBA1C 5.5 10/07/2014   HGBA1C 5.4 04/17/2012   HGBA1C 5.5 01/17/2012     Assessment: Diabetes control:  Controlled Progress toward A1C goal:   At goal Comments: Patient well controlled on diet. Patient was diagnosed in 2011 when CBG >600 in the ED. Patient was on metformin for a time, but no longer requires medication.   Plan: Medications:  No medication at this time. Home glucose monitoring: Frequency:  None Timing:  None Instruction/counseling given: discussed diet Educational resources provided:  None Self management tools provided:  None Other plans: We will check microalbumin:creatinine once a year. We will recheck BMET at next visit.

## 2014-10-07 NOTE — Progress Notes (Signed)
Subjective:     Patient ID: Brady Church, male   DOB: June 25, 1961, 53 y.o.   MRN: [id number]  HPI  Brady Church is a 53 yo male with PMHx of HTN, GERD, Type 2 DM, HLD who presents for routine follow up. Patient's blood pressure is elevated, but he has not taken it in over a month since he was down in [place]. Patient does complain of a 1x1 cm raised lesion on his anterior left wrist that comes and goes over the past 3 weeks. It is non-painful. He doesn't remember any trauma to the area, any bites or any history of this before. Patient states that it does not bother him.   Review of Systems  General: Denies fever, chills, fatigue, change in appetite and diaphoresis.  Respiratory: Denies SOB, cough, DOE, chest tightness, and wheezing.   Cardiovascular: Denies chest pain and palpitations.  Gastrointestinal: Denies nausea, vomiting, abdominal pain, diarrhea, constipation, blood in stool and abdominal distention.  Genitourinary: Denies dysuria, urgency, frequency, hematuria, suprapubic pain and flank pain. Endocrine: Denies polyuria, and polydipsia. Musculoskeletal: Denies myalgias, back pain, joint swelling, arthralgias and gait problem.  Skin: Raised non-painful lesion on left wrist. Denies pallor, rash and wounds.  Neurological: Denies dizziness, headaches, weakness, lightheadedness, numbness,seizures, and syncope, Psychiatric/Behavioral: Denies mood changes, confusion, nervousness, sleep disturbance and agitation.     Objective:   Physical Exam  Filed Vitals:   10/07/14 1508  BP: 165/106  Pulse: 84  Temp: 98.1 F (36.7 C)  TempSrc: Oral  Height: 5\' 8"  (1.727 m)  Weight: 236 lb 11.2 oz (107.366 kg)  SpO2: 100%   General: Vital signs reviewed.  Patient is well-developed and well-nourished, in no acute distress and cooperative with exam.   Cardiovascular: RRR, S1 normal, S2 normal, no murmurs, gallops, or rubs. Pulmonary/Chest: Clear to auscultation bilaterally, no wheezes, rales, or  rhonchi. Abdominal: Soft, non-tender, non-distended, BS +, no masses, organomegaly, or guarding present.  Musculoskeletal: No joint deformities, erythema, or stiffness, ROM full and nontender. Extremities: No lower extremity edema bilaterally,  pulses symmetric and intact bilaterally. No cyanosis or clubbing. Skin: Raised, 1x1 cm hard lesion on left anterior wrist, Non-painful, non-fluctuant, no erythema or increased warmth, no breaks in the skin. Warm, dry and intact. No rashes or erythema. Psychiatric: Normal mood and affect. speech and behavior is normal. Cognition and memory are normal.       Assessment:         Plan:     Please see problem based assessment and plan.

## 2014-10-07 NOTE — Assessment & Plan Note (Signed)
  Assessment: Progress toward smoking cessation:   Deteriorated Barriers to progress toward smoking cessation:   Friends Comments: Patient previously quit for 10 years and then regressed to 1/4 ppd.  Plan: Instruction/counseling given:  I counseled patient on the dangers of tobacco use, advised patient to stop smoking, and reviewed strategies to maximize success. Educational resources provided:   None Self management tools provided:   None Medications to assist with smoking cessation:  None Patient agreed to the following self-care plans for smoking cessation: set a quit date and stop smoking, cut down the number of cigarettes smoked  Other plans: Discuss in one month.

## 2014-10-08 NOTE — Progress Notes (Signed)
Internal Medicine Clinic Attending  I saw and evaluated the patient.  I personally confirmed the key portions of the history and exam documented by Dr. Richardson and I reviewed pertinent patient test results.  The assessment, diagnosis, and plan were formulated together and I agree with the documentation in the resident's note. 

## 2014-10-09 ENCOUNTER — Ambulatory Visit: Payer: Self-pay

## 2014-11-06 ENCOUNTER — Ambulatory Visit (INDEPENDENT_AMBULATORY_CARE_PROVIDER_SITE_OTHER): Payer: Self-pay | Admitting: Internal Medicine

## 2014-11-06 ENCOUNTER — Encounter: Payer: Self-pay | Admitting: Internal Medicine

## 2014-11-06 VITALS — BP 151/82 | HR 89 | Temp 98.0°F | Ht 68.0 in | Wt 247.4 lb

## 2014-11-06 DIAGNOSIS — F1010 Alcohol abuse, uncomplicated: Secondary | ICD-10-CM

## 2014-11-06 DIAGNOSIS — F17200 Nicotine dependence, unspecified, uncomplicated: Secondary | ICD-10-CM

## 2014-11-06 DIAGNOSIS — Z8639 Personal history of other endocrine, nutritional and metabolic disease: Secondary | ICD-10-CM

## 2014-11-06 DIAGNOSIS — F1020 Alcohol dependence, uncomplicated: Secondary | ICD-10-CM

## 2014-11-06 DIAGNOSIS — F17210 Nicotine dependence, cigarettes, uncomplicated: Secondary | ICD-10-CM

## 2014-11-06 DIAGNOSIS — I10 Essential (primary) hypertension: Secondary | ICD-10-CM

## 2014-11-06 DIAGNOSIS — Z299 Encounter for prophylactic measures, unspecified: Secondary | ICD-10-CM

## 2014-11-06 DIAGNOSIS — E785 Hyperlipidemia, unspecified: Secondary | ICD-10-CM

## 2014-11-06 HISTORY — DX: Alcohol dependence, uncomplicated: F10.20

## 2014-11-06 LAB — GLUCOSE, CAPILLARY: GLUCOSE-CAPILLARY: 136 mg/dL — AB (ref 70–99)

## 2014-11-06 LAB — BASIC METABOLIC PANEL WITH GFR
BUN: 23 mg/dL (ref 6–23)
CALCIUM: 9.3 mg/dL (ref 8.4–10.5)
CHLORIDE: 106 meq/L (ref 96–112)
CO2: 22 mEq/L (ref 19–32)
CREATININE: 0.97 mg/dL (ref 0.50–1.35)
GFR, Est African American: >89 mL/min
GFR, Est Non African American: 89 mL/min
Glucose, Bld: 95 mg/dL (ref 70–99)
Potassium: 3.6 mEq/L (ref 3.5–5.3)
Sodium: 140 mEq/L (ref 135–145)

## 2014-11-06 MED ORDER — HYDROCHLOROTHIAZIDE 25 MG PO TABS: 25.0000 mg | ORAL_TABLET | Freq: Every day | ORAL | Status: DC

## 2014-11-06 NOTE — Patient Instructions (Signed)
General Instructions:   Please bring your medicines with you each time you come to clinic.  Medicines may include prescription medications, over-the-counter medications, herbal remedies, eye drops, vitamins, or other pills.  1. PLEASE take Hydrochlorothiazide 25 mg once a day 2. Please continue taking your lovastatin as prescribed 3. Work on quitting smoking and cut back on alcohol  Smoking Cessation Quitting smoking is important to your health and has many advantages. However, it is not always easy to quit since nicotine is a very addictive drug. Oftentimes, people try 3 times or more before being able to quit. This document explains the best ways for you to prepare to quit smoking. Quitting takes hard work and a lot of effort, but you can do it. ADVANTAGES OF QUITTING SMOKING  You will live longer, feel better, and live better.  Your body will feel the impact of quitting smoking almost immediately.  Within 20 minutes, blood pressure decreases. Your pulse returns to its normal level.  After 8 hours, carbon monoxide levels in the blood return to normal. Your oxygen level increases.  After 24 hours, the chance of having a heart attack starts to decrease. Your breath, hair, and body stop smelling like smoke.  After 48 hours, damaged nerve endings begin to recover. Your sense of taste and smell improve.  After 72 hours, the body is virtually free of nicotine. Your bronchial tubes relax and breathing becomes easier.  After 2 to 12 weeks, lungs can hold more air. Exercise becomes easier and circulation improves.  The risk of having a heart attack, stroke, cancer, or lung disease is greatly reduced.  After 1 year, the risk of coronary heart disease is cut in half.  After 5 years, the risk of stroke falls to the same as a nonsmoker.  After 10 years, the risk of lung cancer is cut in half and the risk of other cancers decreases significantly.  After 15 years, the risk of coronary heart  disease drops, usually to the level of a nonsmoker.  If you are pregnant, quitting smoking will improve your chances of having a healthy baby.  The people you live with, especially any children, will be healthier.  You will have extra money to spend on things other than cigarettes. QUESTIONS TO THINK ABOUT BEFORE ATTEMPTING TO QUIT You may want to talk about your answers with your health care provider.  Why do you want to quit?  If you tried to quit in the past, what helped and what did not?  What will be the most difficult situations for you after you quit? How will you plan to handle them?  Who can help you through the tough times? Your family? Friends? A health care provider?  What pleasures do you get from smoking? What ways can you still get pleasure if you quit? Here are some questions to ask your health care provider:  How can you help me to be successful at quitting?  What medicine do you think would be best for me and how should I take it?  What should I do if I need more help?  What is smoking withdrawal like? How can I get information on withdrawal? GET READY  Set a quit date.  Change your environment by getting rid of all cigarettes, ashtrays, matches, and lighters in your home, car, or work. Do not let people smoke in your home.  Review your past attempts to quit. Think about what worked and what did not. GET SUPPORT AND ENCOURAGEMENT You  have a better chance of being successful if you have help. You can get support in many ways.  Tell your family, friends, and coworkers that you are going to quit and need their support. Ask them not to smoke around you.  Get individual, group, or telephone counseling and support. Programs are available at [employer] and health centers. Call your local health department for information about programs in your area.  Spiritual beliefs and practices may help some smokers quit.  Download a "quit meter" on your computer to  keep track of quit statistics, such as how long you have gone without smoking, cigarettes not smoked, and money saved.  Get a self-help book about quitting smoking and staying off tobacco. LEARN NEW SKILLS AND BEHAVIORS  Distract yourself from urges to smoke. Talk to someone, go for a walk, or occupy your time with a task.  Change your normal routine. Take a different route to work. Drink tea instead of coffee. Eat breakfast in a different place.  Reduce your stress. Take a hot bath, exercise, or read a book.  Plan something enjoyable to do every day. Reward yourself for not smoking.  Explore interactive web-based programs that specialize in helping you quit. GET MEDICINE AND USE IT CORRECTLY Medicines can help you stop smoking and decrease the urge to smoke. Combining medicine with the above behavioral methods and support can greatly increase your chances of successfully quitting smoking.  Nicotine replacement therapy helps deliver nicotine to your body without the negative effects and risks of smoking. Nicotine replacement therapy includes nicotine gum, lozenges, inhalers, nasal sprays, and skin patches. Some may be available over-the-counter and others require a prescription.  Antidepressant medicine helps people abstain from smoking, but how this works is unknown. This medicine is available by prescription.  Nicotinic receptor partial agonist medicine simulates the effect of nicotine in your brain. This medicine is available by prescription. Ask your health care provider for advice about which medicines to use and how to use them based on your health history. Your health care provider will tell you what side effects to look out for if you choose to be on a medicine or therapy. Carefully read the information on the package. Do not use any other product containing nicotine while using a nicotine replacement product.  RELAPSE OR DIFFICULT SITUATIONS Most relapses occur within the first 3  months after quitting. Do not be discouraged if you start smoking again. Remember, most people try several times before finally quitting. You may have symptoms of withdrawal because your body is used to nicotine. You may crave cigarettes, be irritable, feel very hungry, cough often, get headaches, or have difficulty concentrating. The withdrawal symptoms are only temporary. They are strongest when you first quit, but they will go away within 10-14 days. To reduce the chances of relapse, try to:  Avoid drinking alcohol. Drinking lowers your chances of successfully quitting.  Reduce the amount of caffeine you consume. Once you quit smoking, the amount of caffeine in your body increases and can give you symptoms, such as a rapid heartbeat, sweating, and anxiety.  Avoid smokers because they can make you want to smoke.  Do not let weight gain distract you. Many smokers will gain weight when they quit, usually less than 10 pounds. Eat a healthy diet and stay active. You can always lose the weight gained after you quit.  Find ways to improve your mood other than smoking. FOR MORE INFORMATION  www.smokefree.gov  Document Released: 11/07/2001 Document Revised:  03/30/2014 Document Reviewed: 02/22/2012 [employer] Patient Information 2015 [place], [place]. This information is not intended to replace advice given to you by your health care provider. Make sure you discuss any questions you have with your health care provider.    Self Care Goals & Plans:  Self Care Goal 11/06/2014  Manage my medications take my medicines as prescribed; bring my medications to every visit; refill my medications on time  Eat healthy foods eat more vegetables; eat foods that are low in salt; eat baked foods instead of fried foods  Be physically active find an activity I enjoy  Stop smoking set a quit date and stop smoking

## 2014-11-06 NOTE — Assessment & Plan Note (Addendum)
BP Readings from Last 3 Encounters:  11/06/14 151/82  10/07/14 165/106  01/23/14 156/102    Lab Results  Component Value Date   NA 143 05/29/2012   K 3.8 05/29/2012   CREATININE 0.94 05/29/2012    Assessment: Blood pressure control:  Uncontrolled Progress toward BP goal:   Improved Comments: Taking HCTZ 12.5 mg daily  Plan: Medications: Increased HCTZ to 25 mg daily Educational resources provided:  diet information Self management tools provided:   food log, smoking cessation Other plans: CHECK BMET TODAY FOR POTASSIUM. Discussed weight loss, diet, alcohol, and smoking cessation

## 2014-11-06 NOTE — Progress Notes (Signed)
   Subjective:    Patient ID: Brady Church, male    DOB: 06-04-1961, 53 y.o.   MRN: [id number]  HPI Mr. Delon is a 53 yo male with PMHx of HTN, Tobacco abuse, HLD, and h/o T2DM now controlled off medications who presents for follow up for blood pressure recheck. Please see problem oriented assessment and plan for more information.  Review of Systems General: Denies fever, chills, fatigue Respiratory: Denies SOB, cough, DOE, chest tightness, and wheezing  Cardiovascular: Denies chest pain and palpitations  Gastrointestinal: Denies diarrhea, constipation, blood in stool or weight loss Skin: Denies pallor, rash and wounds.  Neurological: Denies dizziness, headaches, weakness, lightheadedness  Past Medical History  Diagnosis Date  . Diabetes mellitus   . Hypertension   . Substance abuse     cocaine. Stopped in 2010  . High cholesterol   . Tobacco use disorder 10/07/2014   Current Outpatient Prescriptions on File Prior to Visit  Medication Sig Dispense Refill  . bimatoprost (LUMIGAN) 0.03 % ophthalmic solution Place 1 drop into both eyes at bedtime.    . brimonidine (ALPHAGAN) 0.2 % ophthalmic solution Place 1 drop into both eyes 2 (two) times daily.    . dorzolamide (TRUSOPT) 2 % ophthalmic solution Place 1 drop into both eyes 2 (two) times daily.    . hydrochlorothiazide (HYDRODIURIL) 12.5 MG tablet Take 1 tablet (12.5 mg total) by mouth daily. 30 tablet 3  . lovastatin (MEVACOR) 20 MG tablet Take 1 tablet (20 mg total) by mouth daily. 30 tablet 11   No current facility-administered medications on file prior to visit.      Objective:   Physical Exam Filed Vitals:   11/06/14 0932  BP: 151/82  Pulse: 89  Temp: 98 F (36.7 C)  TempSrc: Oral  Height: 5\' 8"  (1.727 m)  Weight: 247 lb 6.4 oz (112.22 kg)  SpO2: 96%   General: Vital signs reviewed.  Patient is well-developed and well-nourished, in no acute distress and cooperative with exam.  Cardiovascular: RRR, S1  normal, S2 normal, no murmurs, gallops, or rubs. Pulmonary/Chest: Clear to auscultation bilaterally, no wheezes, rales, or rhonchi. Abdominal: Soft, non-tender, non-distended, BS +, obese  Extremities: No lower extremity edema bilaterally Skin: Warm, dry and intact. No rashes or erythema. Psychiatric: Normal mood and affect. speech and behavior is normal. Cognition and memory are normal.     Assessment & Plan:   Please see problem based assessment and plan.

## 2014-11-06 NOTE — Assessment & Plan Note (Addendum)
  Assessment: Progress toward smoking cessation:   Improved Barriers to progress toward smoking cessation:   Friends Comments: smoking 1-2 cigarettes a day  Plan: Instruction/counseling given:  I counseled patient on the dangers of tobacco use, advised patient to stop smoking, and reviewed strategies to maximize success. Educational resources provided:   Quit Now, pamplets Self management tools provided:    Medications to assist with smoking cessation:  None Patient agreed to the following self-care plans for smoking cessation: set a quit date and stop smoking  Other plans: Plans to quit completely in the next 6 months (by next visit)

## 2014-11-06 NOTE — Assessment & Plan Note (Signed)
Assessment: Drinks 40 oz of Natural Light a day with friends in the afternoon. Patient denies dependence. He does not think he needs to cut back. He does not need an eye opener. He does not feel guilty about it. He does not feel angry when people discuss his drinking with him. He enjoys the taste beer.  Plan: -Discussed decreasing amount -Thinks he can cut back the amount

## 2014-11-06 NOTE — Assessment & Plan Note (Signed)
Discussed colonoscopy. Patient willing, but cannot afford at this time given patient does not have insurance nor orange card. No weight loss, blood in stool, constipation, diarrhea or family history of colon cancer.  Plan: -Discuss FOBT cards at next visit versus colonoscopy

## 2014-11-08 ENCOUNTER — Other Ambulatory Visit: Payer: Self-pay | Admitting: Internal Medicine

## 2014-11-09 NOTE — Progress Notes (Signed)
Internal Medicine Clinic Attending  I saw and evaluated the patient.  I personally confirmed the key portions of the history and exam documented by Dr. Richardson and I reviewed pertinent patient test results.  The assessment, diagnosis, and plan were formulated together and I agree with the documentation in the resident's note. 

## 2014-11-10 ENCOUNTER — Other Ambulatory Visit: Payer: Self-pay | Admitting: Internal Medicine

## 2014-11-10 DIAGNOSIS — I10 Essential (primary) hypertension: Secondary | ICD-10-CM

## 2014-11-10 DIAGNOSIS — E876 Hypokalemia: Secondary | ICD-10-CM | POA: Insufficient documentation

## 2014-11-10 MED ORDER — POTASSIUM CHLORIDE ER 20 MEQ PO TBCR: 10.0000 meq | EXTENDED_RELEASE_TABLET | Freq: Every day | ORAL | Status: DC

## 2014-11-10 MED ORDER — POTASSIUM CHLORIDE ER 20 MEQ PO TBCR: 20.0000 meq | EXTENDED_RELEASE_TABLET | Freq: Every day | ORAL | Status: DC

## 2015-01-06 ENCOUNTER — Telehealth: Payer: Self-pay | Admitting: Internal Medicine

## 2015-01-06 NOTE — Telephone Encounter (Signed)
Call to patient to confirm appointment for 01/07/15 at 9:15. Hm is disconnected cell is busy signal

## 2015-01-07 ENCOUNTER — Ambulatory Visit (HOSPITAL_COMMUNITY)
Admission: RE | Admit: 2015-01-07 | Discharge: 2015-01-07 | Disposition: A | Discharge status: Home / Self Care | Payer: Medicaid Other | Source: Ambulatory Visit | Attending: Internal Medicine | Admitting: Internal Medicine

## 2015-01-07 ENCOUNTER — Telehealth: Payer: Self-pay | Admitting: Licensed Clinical Social Worker

## 2015-01-07 ENCOUNTER — Ambulatory Visit (INDEPENDENT_AMBULATORY_CARE_PROVIDER_SITE_OTHER): Payer: Self-pay | Admitting: Internal Medicine

## 2015-01-07 ENCOUNTER — Encounter: Payer: Self-pay | Admitting: Internal Medicine

## 2015-01-07 VITALS — BP 154/96 | HR 86 | Temp 97.8°F | Ht 68.0 in | Wt 265.2 lb

## 2015-01-07 DIAGNOSIS — I10 Essential (primary) hypertension: Secondary | ICD-10-CM

## 2015-01-07 DIAGNOSIS — E785 Hyperlipidemia, unspecified: Secondary | ICD-10-CM

## 2015-01-07 DIAGNOSIS — Z418 Encounter for other procedures for purposes other than remedying health state: Secondary | ICD-10-CM

## 2015-01-07 DIAGNOSIS — F17200 Nicotine dependence, unspecified, uncomplicated: Secondary | ICD-10-CM

## 2015-01-07 DIAGNOSIS — I209 Angina pectoris, unspecified: Secondary | ICD-10-CM | POA: Insufficient documentation

## 2015-01-07 DIAGNOSIS — Z720 Tobacco use: Secondary | ICD-10-CM

## 2015-01-07 DIAGNOSIS — Z299 Encounter for prophylactic measures, unspecified: Secondary | ICD-10-CM

## 2015-01-07 DIAGNOSIS — F1010 Alcohol abuse, uncomplicated: Secondary | ICD-10-CM

## 2015-01-07 DIAGNOSIS — I2089 Other forms of angina pectoris: Secondary | ICD-10-CM | POA: Insufficient documentation

## 2015-01-07 DIAGNOSIS — E876 Hypokalemia: Secondary | ICD-10-CM

## 2015-01-07 DIAGNOSIS — I208 Other forms of angina pectoris: Secondary | ICD-10-CM

## 2015-01-07 LAB — BASIC METABOLIC PANEL WITH GFR
BUN: 15 mg/dL (ref 6–23)
CO2: 28 mEq/L (ref 19–32)
Calcium: 9.6 mg/dL (ref 8.4–10.5)
Chloride: 102 mEq/L (ref 96–112)
Creat: 0.93 mg/dL (ref 0.50–1.35)
GFR, Est African American: >89 mL/min
Glucose, Bld: 220 mg/dL — ABNORMAL HIGH (ref 70–99)
Potassium: 4.1 mEq/L (ref 3.5–5.3)
Sodium: 140 mEq/L (ref 135–145)

## 2015-01-07 LAB — LIPID PANEL
Cholesterol: 201 mg/dL — ABNORMAL HIGH (ref 0–200)
HDL: 47 mg/dL (ref >39)
LDL Cholesterol: 107 mg/dL — ABNORMAL HIGH (ref 0–99)
Total CHOL/HDL Ratio: 4.3 Ratio
Triglycerides: 234 mg/dL — ABNORMAL HIGH (ref <150)
VLDL: 47 mg/dL — AB (ref 0–40)

## 2015-01-07 LAB — MAGNESIUM: Magnesium: 1.9 mg/dL (ref 1.5–2.5)

## 2015-01-07 MED ORDER — LOVASTATIN 20 MG PO TABS: 20.0000 mg | ORAL_TABLET | Freq: Every day | ORAL | Status: DC

## 2015-01-07 MED ORDER — CARVEDILOL 6.25 MG PO TABS: 6.2500 mg | ORAL_TABLET | Freq: Two times a day (BID) | ORAL | Status: DC

## 2015-01-07 MED ORDER — HYDROCHLOROTHIAZIDE 25 MG PO TABS: 25.0000 mg | ORAL_TABLET | Freq: Every day | ORAL | Status: DC

## 2015-01-07 MED ORDER — CARVEDILOL 3.125 MG PO TABS: 3.1250 mg | ORAL_TABLET | Freq: Two times a day (BID) | ORAL | Status: DC

## 2015-01-07 NOTE — Assessment & Plan Note (Addendum)
Compliant with lovastatin 20 mg daily.  Plan: -Recheck lipid panel today.  Addendum: Cholesterol 201 and LDL 107 Will increase lovastatin to 40 mg daily

## 2015-01-07 NOTE — Assessment & Plan Note (Signed)
  Assessment: Progress toward smoking cessation:   Improved Barriers to progress toward smoking cessation:   Habit, social situations, friends Comments: Cut back to 1 cigarette a day or 7/week  Plan: Instruction/counseling given:  I counseled patient on the dangers of tobacco use, advised patient to stop smoking, and reviewed strategies to maximize success. Medications to assist with smoking cessation:  None

## 2015-01-07 NOTE — Patient Instructions (Addendum)
General Instructions:    Please bring your medicines with you each time you come to clinic.  Medicines may include prescription medications, over-the-counter medications, herbal remedies, eye drops, vitamins, or other pills.   FOR YOUR HIGH BLOOD PRESSURE: -TAKE YOUR HYDROCHLOROTHIAZIDE (HCTZ) 25 MG DAILY -START TAKING CARVEDILOL 6.25 MG TWICE A DAY -CONTINUE TAKING LOVASTATIN 20 MG DAILY  WE WILL REFER YOU TO A CARDIOLOGIST (HEART DOCTOR) ONCE YOUR INSURANCE COMES THROUGH.  IF YOU HAVE ANY RECURRENT SUDDEN ONSET OF CHEST PAIN, PLEASE GO TO THE ED.  Chest Pain (Nonspecific) It is often hard to give a specific diagnosis for the cause of chest pain. There is always a chance that your pain could be related to something serious, such as a heart attack or a blood clot in the lungs. You need to follow up with your health care provider for further evaluation. CAUSES   Heartburn.  Pneumonia or bronchitis.  Anxiety or stress.  Inflammation around your heart (pericarditis) or lung (pleuritis or pleurisy).  A blood clot in the lung.  A collapsed lung (pneumothorax). It can develop suddenly on its own (spontaneous pneumothorax) or from trauma to the chest.  Shingles infection (herpes zoster virus). The chest wall is composed of bones, muscles, and cartilage. Any of these can be the source of the pain.  The bones can be bruised by injury.  The muscles or cartilage can be strained by coughing or overwork.  The cartilage can be affected by inflammation and become sore (costochondritis). DIAGNOSIS  Lab tests or other studies may be needed to find the cause of your pain. Your health care provider may have you take a test called an ambulatory electrocardiogram (ECG). An ECG records your heartbeat patterns over a 24-hour period. You may also have other tests, such as:  Transthoracic echocardiogram (TTE). During echocardiography, sound waves are used to evaluate how blood flows through your  heart.  Transesophageal echocardiogram (TEE).  Cardiac monitoring. This allows your health care provider to monitor your heart rate and rhythm in real time.  Holter monitor. This is a portable device that records your heartbeat and can help diagnose heart arrhythmias. It allows your health care provider to track your heart activity for several days, if needed.  Stress tests by exercise or by giving medicine that makes the heart beat faster. TREATMENT   Treatment depends on what may be causing your chest pain. Treatment may include:  Acid blockers for heartburn.  Anti-inflammatory medicine.  Pain medicine for inflammatory conditions.  Antibiotics if an infection is present.  You may be advised to change lifestyle habits. This includes stopping smoking and avoiding alcohol, caffeine, and chocolate.  You may be advised to keep your head raised (elevated) when sleeping. This reduces the chance of acid going backward from your stomach into your esophagus. Most of the time, nonspecific chest pain will improve within 2-3 days with rest and mild pain medicine.  HOME CARE INSTRUCTIONS   If antibiotics were prescribed, take them as directed. Finish them even if you start to feel better.  For the next few days, avoid physical activities that bring on chest pain. Continue physical activities as directed.  Do not use any tobacco products, including cigarettes, chewing tobacco, or electronic cigarettes.  Avoid drinking alcohol.  Only take medicine as directed by your health care provider.  Follow your health care provider's suggestions for further testing if your chest pain does not go away.  Keep any follow-up appointments you made. If you do not  go to an appointment, you could develop lasting (chronic) problems with pain. If there is any problem keeping an appointment, call to reschedule. SEEK MEDICAL CARE IF:   Your chest pain does not go away, even after treatment.  You have a rash  with blisters on your chest.  You have a fever. SEEK IMMEDIATE MEDICAL CARE IF:   You have increased chest pain or pain that spreads to your arm, neck, jaw, back, or abdomen.  You have shortness of breath.  You have an increasing cough, or you cough up blood.  You have severe back or abdominal pain.  You feel nauseous or vomit.  You have severe weakness.  You faint.  You have chills. This is an emergency. Do not wait to see if the pain will go away. Get medical help at once. Call your local emergency services (911 in [place] Do not drive yourself to the hospital. MAKE SURE YOU:   Understand these instructions.  Will watch your condition.  Will get help right away if you are not doing well or get worse. Document Released: 08/23/2005 Document Revised: 11/18/2013 Document Reviewed: 06/18/2008 [facility] Patient Information 2015 [place], [place]. This information is not intended to replace advice given to you by your health care provider. Make sure you discuss any questions you have with your health care provider.  Angina Pectoris Angina pectoris, often just called angina, is extreme discomfort in your chest, neck, or arm caused by a lack of blood in the middle and thickest layer of your heart wall (myocardium). It may feel like tightness or heavy pressure. It may feel like a crushing or squeezing pain. Some people say it feels like gas or indigestion. It may go down your shoulders, back, and arms. Some people may have symptoms other than pain. These symptoms include fatigue, shortness of breath, cold sweats, or nausea. There are four different types of angina:  Stable angina--Stable angina usually occurs in episodes of predictable frequency and duration. It usually is brought on by physical activity, emotional stress, or excitement. These are all times when the myocardium needs more oxygen. Stable angina usually lasts a few minutes and often is relieved by taking a medicine that can be  taken under your tongue (sublingually). The medicine is called nitroglycerin. Stable angina is caused by a buildup of plaque inside the arteries, which restricts blood flow to the heart muscle (atherosclerosis).  Unstable angina--Unstable angina can occur even when your body experiences little or no physical exertion. It can occur during sleep. It can also occur at rest. It can suddenly increase in severity or frequency. It might not be relieved by sublingual nitroglycerin. It can last up to 30 minutes. The most common cause of unstable angina is a blood clot that has developed on the top of plaque buildup inside a coronary artery. It can lead to a heart attack if the blood clot completely blocks the artery.  Microvascular angina--This type of angina is caused by a disorder of tiny blood vessels called arterioles. Microvascular angina is more common in women. The pain may be more severe and last longer than other types of angina pectoris.  Prinzmetal or variant angina--This type of angina pectoris usually occurs when your body experiences little or no physical exertion. It especially occurs in the early morning hours. It is caused by a spasm of your coronary artery. HOME CARE INSTRUCTIONS   Only take over-the-counter and prescription medicines as directed by your health care provider.  Stay active or increase your  exercise as directed by your health care provider.  Limit strenuous activity as directed by your health care provider.  Limit heavy lifting as directed by your health care provider.  Maintain a healthy weight.  Learn about and eat heart-healthy foods.  Do not use any tobacco products including cigarettes, chewing tobacco or electronic cigarettes. SEEK IMMEDIATE MEDICAL CARE IF:  You experience the following symptoms:  Chest, neck, deep shoulder, or arm pain or discomfort that lasts more than a few minutes.  Chest, neck, deep shoulder, or arm pain or discomfort that goes away and  comes back, repeatedly.  Heavy sweating with discomfort, without a noticeable cause.  Shortness of breath or difficulty breathing.  Angina that does not get better after a few minutes of rest or after taking sublingual nitroglycerin. These can all be symptoms of a heart attack, which is a medical emergency! Get medical help at once. Call your local emergency service (911 in [place] immediately. Do not  drive yourself to the hospital and do not  wait to for your symptoms to go away. MAKE SURE YOU:  Understand these instructions.  Will watch your condition.  Will get help right away if you are not doing well or get worse. Document Released: 11/13/2005 Document Revised: 11/18/2013 Document Reviewed: 03/17/2014 [facility] Patient Information 2015 [place], [place]. This information is not intended to replace advice given to you by your health care provider. Make sure you discuss any questions you have with your health care provider.

## 2015-01-07 NOTE — Assessment & Plan Note (Signed)
Patient has cut back the amount of alcohol he drinks to 2 40 oz a week or 4 16 oz of beer a week.  Plan: -Counseled patient on alcohol abuse -Commended him on cutting back -Check magnesium level

## 2015-01-07 NOTE — Telephone Encounter (Signed)
Mr. Brady Church provided [facility] with letter of approval of Medicaid benefits, however, pt is showing ineligible in [place].  There will be a barrier to making referrals should pt continue to show as ineligible on [place].  CSW placed call to pt's Medicaid caseworker to inquire if there is a lag time with [place] or when [place] will be updated.  Message Left.  CSW also inquiring with [employer].

## 2015-01-07 NOTE — Telephone Encounter (Signed)
Response from general DSS worker: There should be no lag time between [place] [employer] and receiving letter of approval.  Although a person could be eligible for one,  two days  even up to weeks or months and then become ineligible due to : 1- Increase in income (Tree surgeon, Dance movement psychotherapist,  anything that may give them increase in income.) 2- We may get a notice from [employer] of some reported money or other resources that client did not tell us about  (Bank accounts, Stocks, Bonds) 3- Sometimes  Fast (the system) will close a case and we will not know it; this usually happens if there is an update to the system.      Will await response from Mr. Orsak specific case worker.

## 2015-01-07 NOTE — Assessment & Plan Note (Addendum)
BP Readings from Last 3 Encounters:  01/07/15 154/96  11/06/14 151/82  10/07/14 165/106    Lab Results  Component Value Date   NA 140 11/06/2014   K 3.6 11/06/2014   CREATININE 0.97 11/06/2014    Assessment: Blood pressure control:  Uncontrolled Progress toward BP goal:   Deteriorated Comments: Recheck 154/90s. Patient has been compliant with HCTZ 25 mg daily. Patient states BP runs 130-140s/90s at home.  Plan: Medications:  continue current medications with addition of carvedilol 3.125 mg twice a day Self management tools provided:  Continue to check BP at home Other plans: return in 1 month for follow up

## 2015-01-07 NOTE — Assessment & Plan Note (Signed)
Patient counseled on colon cancer screenings, especially given family history of GI cancer in father. Patient unsure if he had colon cancer. Patient denies constipation, diarrhea, blood in stool, weight loss. Patient wants to think about FOBT cards versus colonoscopy.  Plan: -Re-address at next visit for decision

## 2015-01-07 NOTE — Progress Notes (Signed)
   Subjective:    Patient ID: Brady Church, male    DOB: [birth year], 54 y.o.   MRN: [id number]  HPI Brady Church is a 54 yo male with PMHx of HTN, GERD, Alcohol and tobacco abuse and HLD who presents for follow up. Please see problem oriented assessment and plan for more information.  Review of Systems General: Denies fatigue, and diaphoresis.  Respiratory: Denies SOB, DOE, chest tightness Cardiovascular: Admits to occasional chest pain with exertion. Denies palpitations.  Gastrointestinal: Denies nausea, vomiting, abdominal pain, diarrhea, constipation, blood in stool  Skin: Denies rash and wounds.  Neurological: Denies dizziness, headaches, weakness, lightheadedness, and syncope,   Past Medical History  Diagnosis Date  . Diabetes mellitus   . Hypertension   . Substance abuse     cocaine. Stopped in 2010  . High cholesterol   . Tobacco use disorder 10/07/2014   Current Outpatient Prescriptions on File Prior to Visit  Medication Sig Dispense Refill  . bimatoprost (LUMIGAN) 0.03 % ophthalmic solution Place 1 drop into both eyes at bedtime.    . brimonidine (ALPHAGAN) 0.2 % ophthalmic solution Place 1 drop into both eyes 2 (two) times daily.    . dorzolamide (TRUSOPT) 2 % ophthalmic solution Place 1 drop into both eyes 2 (two) times daily.    . hydrochlorothiazide (HYDRODIURIL) 25 MG tablet Take 1 tablet (25 mg total) by mouth daily. 30 tablet 6  . lovastatin (MEVACOR) 20 MG tablet Take 1 tablet (20 mg total) by mouth daily. 30 tablet 11  . Potassium Chloride ER 20 MEQ TBCR Take 20 mEq by mouth daily. 30 tablet 3   No current facility-administered medications on file prior to visit.       Objective:   Physical Exam Filed Vitals:   01/07/15 0905  BP: 152/102  Pulse: 92  Temp: 97.8 F (36.6 C)  Height: 5\' 8"  (1.727 m)  Weight: 265 lb 3.2 oz (120.294 kg)  SpO2: 97%   General: Vital signs reviewed.  Patient is well-developed and well-nourished, in no acute distress and  cooperative with exam.  Cardiovascular: RRR, S1 normal, S2 normal, no murmurs, gallops, or rubs. Pulmonary/Chest: Clear to auscultation bilaterally, no wheezes, rales, or rhonchi. Abdominal: Soft, non-tender, non-distended, BS + Extremities: No lower extremity edema bilaterally Neurological: A&O x3 Psychiatric: Normal mood and affect.     Assessment & Plan:   Please see problem based assessment and plan.

## 2015-01-07 NOTE — Assessment & Plan Note (Addendum)
History of hypokalemia in setting of HCTZ. Patient has not been taking KDur 20 mEq daily.  Plan: -Recheck BMET -Check Magnesium -Check renin-aldosterone level  Addendum: -renin-aldo pending -Potassium and magnesium normal off of potassium

## 2015-01-07 NOTE — Assessment & Plan Note (Addendum)
Patient presents with complaint of throbbing left sided chest pain that occurred one week ago. Patient states the episode last for 5 minutes while he was walking to his friend's house. Pain was not severe, 3/10, and was not associated with diaphoresis, nausea, vomiting, shortness of breath. Pain did not radiate anywhere. He did not take anything for the pain. He has never experienced this before. Since last week, this has occurred twice more while walking. Patient does have significant risk factors including hypertension, tobacco abuse and family history of premature MI resulting in death. Patient has never had an echocardiogram or a stress test.   We checked an EKG which showed NSR with TWI in leads I, aVL, V4, V5, and V6 which is changed from prior EKG in 2013. EKG also shows evidence of LVH likely from long standing hypertension.  Plan: -Referral to cardiology for possible stress test -Echocardiogram -Lipid panel -Start Carvedilol 3.125 mg BID -Based on EF and BP at follow up visit, consider increasing carvedilol to 6.25 mg BID  Patient instructed if chest pain returns to go to the Emergency Department immediately.  Addendum: -Echo showed EF of 40-45% and grade 1 diastolic dysfunction

## 2015-01-07 NOTE — Progress Notes (Signed)
Internal Medicine Clinic Attending  Case discussed with Dr. Richardson at the time of the visit.  We reviewed the resident's history and exam and pertinent patient test results.  I agree with the assessment, diagnosis, and plan of care documented in the resident's note. 

## 2015-01-08 MED ORDER — LOVASTATIN 40 MG PO TABS: 40.0000 mg | ORAL_TABLET | Freq: Every day | ORAL | Status: DC

## 2015-01-08 NOTE — Addendum Note (Signed)
Addended by: Aldean Baker on: 01/08/2015 01:08 PM   Modules accepted: Orders

## 2015-01-12 ENCOUNTER — Ambulatory Visit (HOSPITAL_COMMUNITY)
Admission: RE | Admit: 2015-01-12 | Discharge: 2015-01-12 | Disposition: A | Discharge status: Home / Self Care | Payer: Medicaid Other | Source: Ambulatory Visit | Attending: Internal Medicine | Admitting: Internal Medicine

## 2015-01-12 DIAGNOSIS — R079 Chest pain, unspecified: Secondary | ICD-10-CM

## 2015-01-12 DIAGNOSIS — I208 Other forms of angina pectoris: Secondary | ICD-10-CM

## 2015-01-12 NOTE — Progress Notes (Signed)
  Echocardiogram 2D Echocardiogram has been performed.  Leta Jungling M 01/12/2015, 1:22 PM

## 2015-01-13 NOTE — Telephone Encounter (Signed)
Received call back from pt's Medicaid Case Worker.  Case Worker states Brady Church Medicaid eligibility should be corrected in [place] by Monday, 01/11/2015.

## 2015-01-26 LAB — ALDOSTERONE + RENIN ACTIVITY W/ RATIO
ALDO / PRA Ratio: 11.3 Ratio (ref 0.9–28.9)
Aldosterone: 11 ng/dL
PRA LC/MS/MS: 0.97 ng/mL/h (ref 0.25–5.82)

## 2015-03-09 ENCOUNTER — Ambulatory Visit (INDEPENDENT_AMBULATORY_CARE_PROVIDER_SITE_OTHER): Payer: Medicaid Other | Admitting: Internal Medicine

## 2015-03-09 ENCOUNTER — Encounter: Payer: Self-pay | Admitting: Internal Medicine

## 2015-03-09 VITALS — BP 138/81 | HR 90 | Temp 97.9°F | Ht 68.0 in | Wt 259.3 lb

## 2015-03-09 DIAGNOSIS — R739 Hyperglycemia, unspecified: Secondary | ICD-10-CM

## 2015-03-09 LAB — GLUCOSE, CAPILLARY: Glucose-Capillary: 282 mg/dL — ABNORMAL HIGH (ref 70–99)

## 2015-03-09 LAB — POCT GLYCOSYLATED HEMOGLOBIN (HGB A1C): HEMOGLOBIN A1C: 7.4

## 2015-03-09 NOTE — Assessment & Plan Note (Addendum)
He is living at [facility] and nursing students have been coming by to check resident's CBGs about twice per week.  He says the CBGs have been high; he reports CBGs 130s-250s.  CBGs were checked around 9-10AM (after his 730AM breakfast).  CBG 282 in clinic this AM (he ate sausage, eggs, grits, juice, coffee with sugar for breakfast at 7:30AM today).  No personal DM hx.  Hgb A1c in EPIC 5.5 and below (most recent was Nov 2015).  His risk factors for DM include fam hx (maternal side), obesity and smoking.  I think these elevations are likely due to excess sugar in diet and perhaps some impaired glucose tolerance (?metabolic syndrome). - poc A1c this AM because he is not fasting - A1c is 7.4 - I have asked patient to return in 1 week for repeat confirmatory A1c; if this is again elevated, will plan to start metformin.

## 2015-03-09 NOTE — Patient Instructions (Addendum)
1. Please come back to clinic for another hemoglobin A1c on Thursday of this week.  If it is still elevated we will start a medication for diabetes treatment.   2. Please take all medications as prescribed.    3. If you have worsening of your symptoms or new symptoms arise, please call the clinic ([phone]), or go to the ER immediately if symptoms are severe.

## 2015-03-09 NOTE — Progress Notes (Signed)
   Subjective:    Patient ID: Brady Church, male    DOB: 09-13-1961, 54 y.o.   MRN: [id number]  HPI Comments: Brady Church is a 54 year old male with PMH as below here with c/o elevated CBGs.  Please see problem based charting for A&P.    Past Medical History  Diagnosis Date  .    Marland Kitchen Hypertension   . Substance abuse     cocaine. Stopped in 2010  . High cholesterol   . Tobacco use disorder 10/07/2014    Review of Systems  Constitutional: Positive for fatigue. Negative for fever, chills and appetite change.  Eyes: Negative for visual disturbance.  Respiratory: Negative for shortness of breath.   Cardiovascular: Negative for chest pain.  Gastrointestinal: Negative for nausea, vomiting, abdominal pain and diarrhea.  Endocrine: Negative for polydipsia and polyuria.       Filed Vitals:   03/09/15 0901  BP: 138/81  Pulse: 90  Temp: 97.9 F (36.6 C)  TempSrc: Oral  Height: 5\' 8"  (1.727 m)  Weight: 259 lb 4.8 oz (117.618 kg)  SpO2: 98%     Objective:   Physical Exam  Constitutional: He is oriented to person, place, and time. He appears well-developed. No distress.  HENT:  Head: Normocephalic and atraumatic.  Mouth/Throat: Oropharynx is clear and moist. No oropharyngeal exudate.  Eyes: EOM are normal. Pupils are equal, round, and reactive to light.  Neck: Neck supple.  Cardiovascular: Normal rate, regular rhythm and normal heart sounds.  Exam reveals no gallop and no friction rub.   No murmur heard. Pulmonary/Chest: Effort normal and breath sounds normal. No respiratory distress. He has no wheezes. He has no rales.  Abdominal: Soft. Bowel sounds are normal. He exhibits no distension and no mass. There is no tenderness. There is no rebound and no guarding.  Musculoskeletal: Normal range of motion. He exhibits no edema or tenderness.  Neurological: He is alert and oriented to person, place, and time. No cranial nerve deficit.  Skin: Skin is warm. He is not diaphoretic.    Psychiatric: He has a normal mood and affect. His behavior is normal.  Vitals reviewed.         Assessment & Plan:  Please see problem based charting for A&P.

## 2015-03-09 NOTE — Progress Notes (Signed)
Internal Medicine Clinic Attending  Case discussed with Dr. Wilson soon after the resident saw the patient.  We reviewed the resident's history and exam and pertinent patient test results.  I agree with the assessment, diagnosis, and plan of care documented in the resident's note.  

## 2015-03-11 ENCOUNTER — Institutional Professional Consult (permissible substitution): Payer: Medicaid Other | Admitting: Cardiology

## 2015-03-16 ENCOUNTER — Ambulatory Visit: Payer: Medicaid Other | Admitting: Internal Medicine

## 2015-03-18 ENCOUNTER — Ambulatory Visit (INDEPENDENT_AMBULATORY_CARE_PROVIDER_SITE_OTHER): Payer: Medicaid Other | Admitting: Internal Medicine

## 2015-03-18 ENCOUNTER — Encounter: Payer: Self-pay | Admitting: Internal Medicine

## 2015-03-18 VITALS — BP 140/90 | HR 80 | Temp 97.8°F | Ht 68.0 in | Wt 254.8 lb

## 2015-03-18 DIAGNOSIS — Z299 Encounter for prophylactic measures, unspecified: Secondary | ICD-10-CM

## 2015-03-18 DIAGNOSIS — Z720 Tobacco use: Secondary | ICD-10-CM

## 2015-03-18 DIAGNOSIS — I10 Essential (primary) hypertension: Secondary | ICD-10-CM

## 2015-03-18 DIAGNOSIS — F17200 Nicotine dependence, unspecified, uncomplicated: Secondary | ICD-10-CM

## 2015-03-18 DIAGNOSIS — Z418 Encounter for other procedures for purposes other than remedying health state: Secondary | ICD-10-CM

## 2015-03-18 DIAGNOSIS — E1136 Type 2 diabetes mellitus with diabetic cataract: Secondary | ICD-10-CM | POA: Diagnosis present

## 2015-03-18 LAB — GLUCOSE, CAPILLARY: GLUCOSE-CAPILLARY: 195 mg/dL — AB (ref 70–99)

## 2015-03-18 LAB — POCT GLYCOSYLATED HEMOGLOBIN (HGB A1C): Hemoglobin A1C: 7.3

## 2015-03-18 MED ORDER — METFORMIN HCL 500 MG PO TABS: 500.0000 mg | ORAL_TABLET | Freq: Two times a day (BID) | ORAL | Status: DC

## 2015-03-18 NOTE — Assessment & Plan Note (Signed)
Counseled cessation.

## 2015-03-18 NOTE — Progress Notes (Signed)
Internal Medicine Clinic Attending  Case discussed with Dr. McLean at the time of the visit.  We reviewed the resident's history and exam and pertinent patient test results.  I agree with the assessment, diagnosis, and plan of care documented in the resident's note. 

## 2015-03-18 NOTE — Progress Notes (Signed)
   Subjective:    Patient ID: Brady Church, male    DOB: 12-12-1960, 54 y.o.   MRN: [id number]  HPI Comments: 54 y.o with metabolic syndrome   He presents for f/u  1. DM 2-dx'ed by 2 A1C >6.5 today 7.3.  He was previously on Metformin 500 mg bid years ago but controlled with diet and exercise and able to come off.  He does report complications of cataracts.  He denies blurry vision, polyuria, polydipsia.  He also just had surgery on right eye last week for glaucoma  2. HTN-BP is 149/98 then repeat 140/90.  He is on Coreg 3.125 mg and HCTZ 25 mg qd.  He took medications today   HM-will need pna 23 vaccine, colonoscopy referral def to PCP SH-still smoking cigarettes and drinks beer      Review of Systems  Respiratory: Negative for shortness of breath.   Cardiovascular: Negative for chest pain.       Objective:   Physical Exam  Constitutional: He is oriented to person, place, and time. He appears well-developed and well-nourished. He is cooperative.  HENT:  Head: Normocephalic and atraumatic.  Mouth/Throat: Abnormal dentition.  Eyes: Pupils are equal, round, and reactive to light. Right eye exhibits no discharge. Left eye exhibits no discharge. Right conjunctiva is injected. No scleral icterus.  Cardiovascular: Normal rate, regular rhythm, S1 normal, S2 normal and normal heart sounds.   No murmur heard. Pulmonary/Chest: Effort normal and breath sounds normal.  Musculoskeletal: He exhibits no edema.  Neurological: He is alert and oriented to person, place, and time. Gait normal.  Skin: Skin is warm, dry and intact. No rash noted.  Psychiatric: He has a normal mood and affect. His speech is normal and behavior is normal. Judgment and thought content normal. Cognition and memory are normal.  Nursing note and vitals reviewed.         Assessment & Plan:  F/u in 3 months

## 2015-03-18 NOTE — Patient Instructions (Addendum)
General Instructions: Please follow up in 3 months  Your hemoglobin A1C is 7.3 today you do have diabetes  Please start Metformin 500 mg 2x per day Please schedule with our nutritionist [name]  Treatment Goals:  Goals (1 Years of Data) as of 03/18/15    None      Progress Toward Treatment Goals:  Treatment Goal 03/18/2015  Stop smoking smoking the same amount    Self Care Goals & Plans:  Self Care Goal 03/18/2015  Manage my medications take my medicines as prescribed; bring my medications to every visit; refill my medications on time; follow the sick day instructions if I am sick  Monitor my health keep track of my blood pressure  Eat healthy foods drink diet soda or water instead of juice or soda; eat more vegetables; eat foods that are low in salt; eat baked foods instead of fried foods; eat fruit for snacks and desserts; eat smaller portions  Be physically active find an activity I enjoy  Stop smoking -  Meeting treatment goals maintain the current self-care plan    No flowsheet data found.   Care Management & Community Referrals:  Referral 03/18/2015  Referrals made for care management support nutritionist  Referrals made to community resources none       Type 2 Diabetes Mellitus Type 2 diabetes mellitus is a long-term (chronic) disease. In type 2 diabetes:  The pancreas does not make enough of a hormone called insulin.  The cells in the body do not respond as well to the insulin that is made.  Both of the above can happen. Normally, insulin moves sugars from food into tissue cells. This gives you energy. If you have type 2 diabetes, sugars cannot be moved into tissue cells. This causes high blood sugar (hyperglycemia).  HOME CARE  Have your hemoglobin A1c level checked twice a year. The level shows if your diabetes is under control or out of control.  Test your blood sugar level every day as told by your doctor.  Check your ketone levels by testing your pee  (urine) when you are sick and as told.  Take your diabetes or insulin medicine as told by your doctor.  Never run out of insulin.  Adjust how much insulin you give yourself based on how many carbs (carbohydrates) you eat. Carbs are in many foods, such as fruits, vegetables, whole grains, and dairy products.  Have a healthy snack between every healthy meal. Have 3 meals and 3 snacks a day.  Lose weight if you are overweight.  Carry a medical alert card or wear your medical alert jewelry.  Carry a 15-gram carb snack with you at all times. Examples include:  Glucose pills, 3 or 4.  Glucose gel, 15-gram tube.  Raisins, 2 tablespoons (24 grams).  Jelly beans, 6.  Animal crackers, 8.  Regular (not diet) pop, 4 ounces (120 milliliters).  Gummy treats, 9.  Notice low blood sugar (hypoglycemia) symptoms, such as:  Shaking (tremors).  Trouble thinking clearly.  Sweating.  Faster heart rate.  Headache.  Dry mouth.  Hunger.  Crabbiness (irritability).  Being worried or tense (anxious).  Restless sleep.  A change in speech or coordination.  Confusion.  Treat low blood sugar right away. If you are alert and can swallow, follow the 15:15 rule:  Take 15-20 grams of a rapid-acting glucose or carb. This includes glucose gel, glucose pills, or 4 ounces (120 milliliters) of fruit juice, regular pop, or low-fat milk.  Check your blood  sugar level 15 minutes after taking the glucose.  Take 15-20 grams more of glucose if the repeat blood sugar level is still 70 mg/dL (milligrams/deciliter) or below.  Eat a meal or snack within 1 hour of the blood sugar levels going back to normal.  Notice early symptoms of high blood sugar, such as:  Being really thirsty or drinking a lot (polydipsia).  Peeing a lot (polyuria).  Do at least 150 minutes of physical activity a week or as told.  Split the 150 minutes of activity up during the week. Do not do 150 minutes of activity in  one day.  Perform exercises, such as weight lifting, at least 2 times a week or as told.  Spend no more than 90 minutes at one time inactive.  Adjust your insulin or food intake as needed if you start a new exercise or sport.  Follow your sick-day plan when you are not able to eat or drink as usual.  Do not smoke, chew tobacco, or use electronic cigarettes.  Women who are not pregnant should drink no more than 1 drink a day. Men should drink no more than 2 drinks a day.  Only drink alcohol with food.  Ask your doctor if alcohol is safe for you.  Tell your doctor if you drink alcohol several times during the week.  See your doctor regularly.  Schedule an eye exam soon after you are told you have diabetes. Schedule exams once every year.  Check your skin and feet every day. Check for cuts, bruises, redness, nail problems, bleeding, blisters, or sores. A doctor should do a foot exam once a year.  Brush your teeth and gums twice a day. Floss once a day. Visit your dentist regularly.  Share your diabetes plan with your workplace or school.  Stay up-to-date with shots that fight against diseases (immunizations).  Learn how to deal with stress.  Get diabetes education and support as needed.  Ask your doctor for special help if:  You need help to maintain or improve how you do things on your own.  You need help to maintain or improve the quality of your life.  You have foot or hand problems.  You have trouble cleaning yourself, dressing, eating, or doing physical activity. GET HELP IF:  You are unable to eat or drink for more than 6 hours.  You feel sick to your stomach (nauseous) or throw up (vomit) for more than 6 hours.  Your blood sugar level is over 240 mg/dL.  There is a change in mental status.  You get another serious illness.  You have watery poop (diarrhea) for more than 6 hours.  You have been sick or have had a fever for 2 or more days and are not  getting better.  You have pain when you are active. GET HELP RIGHT AWAY IF:  You have trouble breathing.  Your ketone levels are higher than your doctor says they should be. MAKE SURE YOU:  Understand these instructions.  Will watch your condition.  Will get help right away if you are not doing well or get worse. Document Released: 08/22/2008 Document Revised: 03/30/2014 Document Reviewed: 06/14/2012 [facility] Patient Information 2015 [place], [place]. This information is not intended to replace advice given to you by your health care provider. Make sure you discuss any questions you have with your health care provider.  Diabetes and Exercise Exercising regularly is important. It is not just about losing weight. It has many health benefits, such  as:  Improving your overall fitness, flexibility, and endurance.  Increasing your bone density.  Helping with weight control.  Decreasing your body fat.  Increasing your muscle strength.  Reducing stress and tension.  Improving your overall health. People with diabetes who exercise gain additional benefits because exercise:  Reduces appetite.  Improves the body's use of blood sugar (glucose).  Helps lower or control blood glucose.  Decreases blood pressure.  Helps control blood lipids (such as cholesterol and triglycerides).  Improves the body's use of the hormone insulin by:  Increasing the body's insulin sensitivity.  Reducing the body's insulin needs.  Decreases the risk for heart disease because exercising:  Lowers cholesterol and triglycerides levels.  Increases the levels of good cholesterol (such as high-density lipoproteins [HDL]) in the body.  Lowers blood glucose levels. YOUR ACTIVITY PLAN  Choose an activity that you enjoy and set realistic goals. Your health care provider or diabetes educator can help you make an activity plan that works for you. Exercise regularly as directed by your health care provider.  This includes:  Performing resistance training twice a week such as push-ups, sit-ups, lifting weights, or using resistance bands.  Performing 150 minutes of cardio exercises each week such as walking, running, or playing sports.  Staying active and spending no more than 90 minutes at one time being inactive. Even short bursts of exercise are good for you. Three 10-minute sessions spread throughout the day are just as beneficial as a single 30-minute session. Some exercise ideas include:  Taking the dog for a walk.  Taking the stairs instead of the elevator.  Dancing to your favorite song.  Doing an exercise video.  Doing your favorite exercise with a friend. RECOMMENDATIONS FOR EXERCISING WITH TYPE 1 OR TYPE 2 DIABETES   Check your blood glucose before exercising. If blood glucose levels are greater than 240 mg/dL, check for urine ketones. Do not exercise if ketones are present.  Avoid injecting insulin into areas of the body that are going to be exercised. For example, avoid injecting insulin into:  The arms when playing tennis.  The legs when jogging.  Keep a record of:  Food intake before and after you exercise.  Expected peak times of insulin action.  Blood glucose levels before and after you exercise.  The type and amount of exercise you have done.  Review your records with your health care provider. Your health care provider will help you to develop guidelines for adjusting food intake and insulin amounts before and after exercising.  If you take insulin or oral hypoglycemic agents, watch for signs and symptoms of hypoglycemia. They include:  Dizziness.  Shaking.  Sweating.  Chills.  Confusion.  Drink plenty of water while you exercise to prevent dehydration or heat stroke. Body water is lost during exercise and must be replaced.  Talk to your health care provider before starting an exercise program to make sure it is safe for you. Remember, almost any  type of activity is better than none. Document Released: 02/03/2004 Document Revised: 03/30/2014 Document Reviewed: 04/22/2013 [facility] Patient Information 2015 [place], [place]. This information is not intended to replace advice given to you by your health care provider. Make sure you discuss any questions you have with your health care provider.

## 2015-03-18 NOTE — Assessment & Plan Note (Signed)
Lab Results  Component Value Date   HGBA1C 7.3 03/18/2015   HGBA1C 7.4 03/09/2015   HGBA1C 5.5 10/07/2014     Assessment: Diabetes control: slightly above goal Progress toward A1C goal:  Unchanged  Comments: none  Plan: Medications:  added Metformin 500 mg bid , counseled on diet and exercise  Instruction/counseling given: discussed diet Other plans: next appt check foot exam, repeat A1C, compliance with metformin, advised pt meet with nutrition

## 2015-03-18 NOTE — Assessment & Plan Note (Addendum)
will need pna 23 vaccine, colonoscopy referral defer to PCP

## 2015-03-18 NOTE — Assessment & Plan Note (Addendum)
BP Readings from Last 3 Encounters:  03/18/15 140/90  03/09/15 138/81  01/07/15 154/96    Lab Results  Component Value Date   NA 140 01/07/2015   K 4.1 01/07/2015   CREATININE 0.93 01/07/2015    Assessment: Blood pressure control: mildly elevated Comments: BP goal should be 130/80 with DM  Plan: Medications:  continue current medications Coreg 3.125 mg bid, HCTZ 25. Consider d/c Coreg if not medically needed and add ACEI at f/u  Other plans: f/u in 3 months

## 2015-03-26 ENCOUNTER — Encounter: Payer: Self-pay | Admitting: Cardiology

## 2015-04-19 ENCOUNTER — Other Ambulatory Visit (HOSPITAL_COMMUNITY): Payer: Self-pay

## 2015-04-19 ENCOUNTER — Inpatient Hospital Stay (HOSPITAL_COMMUNITY)
Admission: EM | Admit: 2015-04-19 | Discharge: 2015-04-20 | DRG: 638 | Disposition: A | Discharge status: Home / Self Care | Payer: Medicaid Other | Attending: Oncology | Admitting: Oncology

## 2015-04-19 ENCOUNTER — Encounter (HOSPITAL_COMMUNITY): Payer: Self-pay | Admitting: Emergency Medicine

## 2015-04-19 ENCOUNTER — Inpatient Hospital Stay (HOSPITAL_COMMUNITY): Payer: Medicaid Other

## 2015-04-19 DIAGNOSIS — Z9114 Patient's other noncompliance with medication regimen: Secondary | ICD-10-CM | POA: Diagnosis present

## 2015-04-19 DIAGNOSIS — I129 Hypertensive chronic kidney disease with stage 1 through stage 4 chronic kidney disease, or unspecified chronic kidney disease: Secondary | ICD-10-CM

## 2015-04-19 DIAGNOSIS — R739 Hyperglycemia, unspecified: Secondary | ICD-10-CM

## 2015-04-19 DIAGNOSIS — J069 Acute upper respiratory infection, unspecified: Secondary | ICD-10-CM | POA: Diagnosis present

## 2015-04-19 DIAGNOSIS — F17210 Nicotine dependence, cigarettes, uncomplicated: Secondary | ICD-10-CM | POA: Diagnosis present

## 2015-04-19 DIAGNOSIS — S80922A Unspecified superficial injury of left lower leg, initial encounter: Secondary | ICD-10-CM

## 2015-04-19 DIAGNOSIS — F1020 Alcohol dependence, uncomplicated: Secondary | ICD-10-CM | POA: Diagnosis present

## 2015-04-19 DIAGNOSIS — N182 Chronic kidney disease, stage 2 (mild): Secondary | ICD-10-CM | POA: Diagnosis present

## 2015-04-19 DIAGNOSIS — H547 Unspecified visual loss: Secondary | ICD-10-CM

## 2015-04-19 DIAGNOSIS — E785 Hyperlipidemia, unspecified: Secondary | ICD-10-CM | POA: Diagnosis present

## 2015-04-19 DIAGNOSIS — F1021 Alcohol dependence, in remission: Secondary | ICD-10-CM

## 2015-04-19 DIAGNOSIS — L03116 Cellulitis of left lower limb: Secondary | ICD-10-CM | POA: Diagnosis present

## 2015-04-19 DIAGNOSIS — H540 Blindness, both eyes: Secondary | ICD-10-CM | POA: Diagnosis present

## 2015-04-19 DIAGNOSIS — E1310 Other specified diabetes mellitus with ketoacidosis without coma: Principal | ICD-10-CM | POA: Diagnosis present

## 2015-04-19 DIAGNOSIS — E1165 Type 2 diabetes mellitus with hyperglycemia: Secondary | ICD-10-CM

## 2015-04-19 DIAGNOSIS — Z590 Homelessness: Secondary | ICD-10-CM | POA: Diagnosis not present

## 2015-04-19 DIAGNOSIS — R058 Other specified cough: Secondary | ICD-10-CM | POA: Diagnosis present

## 2015-04-19 DIAGNOSIS — N179 Acute kidney failure, unspecified: Secondary | ICD-10-CM | POA: Diagnosis present

## 2015-04-19 DIAGNOSIS — E1136 Type 2 diabetes mellitus with diabetic cataract: Secondary | ICD-10-CM | POA: Diagnosis present

## 2015-04-19 DIAGNOSIS — Z79899 Other long term (current) drug therapy: Secondary | ICD-10-CM | POA: Diagnosis not present

## 2015-04-19 DIAGNOSIS — F1010 Alcohol abuse, uncomplicated: Secondary | ICD-10-CM | POA: Diagnosis present

## 2015-04-19 DIAGNOSIS — I10 Essential (primary) hypertension: Secondary | ICD-10-CM | POA: Diagnosis present

## 2015-04-19 DIAGNOSIS — K219 Gastro-esophageal reflux disease without esophagitis: Secondary | ICD-10-CM | POA: Diagnosis present

## 2015-04-19 DIAGNOSIS — E1110 Type 2 diabetes mellitus with ketoacidosis without coma: Secondary | ICD-10-CM | POA: Diagnosis present

## 2015-04-19 DIAGNOSIS — E872 Acidosis: Secondary | ICD-10-CM | POA: Diagnosis not present

## 2015-04-19 DIAGNOSIS — E1122 Type 2 diabetes mellitus with diabetic chronic kidney disease: Secondary | ICD-10-CM

## 2015-04-19 DIAGNOSIS — E1139 Type 2 diabetes mellitus with other diabetic ophthalmic complication: Secondary | ICD-10-CM | POA: Diagnosis not present

## 2015-04-19 DIAGNOSIS — H409 Unspecified glaucoma: Secondary | ICD-10-CM | POA: Diagnosis present

## 2015-04-19 DIAGNOSIS — F1421 Cocaine dependence, in remission: Secondary | ICD-10-CM

## 2015-04-19 DIAGNOSIS — R5383 Other fatigue: Secondary | ICD-10-CM | POA: Diagnosis present

## 2015-04-19 DIAGNOSIS — R05 Cough: Secondary | ICD-10-CM

## 2015-04-19 DIAGNOSIS — Z87891 Personal history of nicotine dependence: Secondary | ICD-10-CM | POA: Diagnosis present

## 2015-04-19 DIAGNOSIS — X58XXXA Exposure to other specified factors, initial encounter: Secondary | ICD-10-CM

## 2015-04-19 HISTORY — DX: Unspecified visual loss: H54.7

## 2015-04-19 LAB — COMPREHENSIVE METABOLIC PANEL
ALT: 23 U/L (ref 17–63)
AST: 29 U/L (ref 15–41)
Albumin: 4 g/dL (ref 3.5–5.0)
Alkaline Phosphatase: 71 U/L (ref 38–126)
Anion gap: 14 (ref 5–15)
BUN: 22 mg/dL — AB (ref 6–20)
CALCIUM: 9.4 mg/dL (ref 8.9–10.3)
CHLORIDE: 92 mmol/L — AB (ref 101–111)
CO2: 25 mmol/L (ref 22–32)
Creatinine, Ser: 1.37 mg/dL — ABNORMAL HIGH (ref 0.61–1.24)
GFR calc non Af Amer: 58 mL/min — ABNORMAL LOW (ref >60)
GLUCOSE: 702 mg/dL — AB (ref 65–99)
Potassium: 4.2 mmol/L (ref 3.5–5.1)
SODIUM: 131 mmol/L — AB (ref 135–145)
Total Bilirubin: 1 mg/dL (ref 0.3–1.2)
Total Protein: 7.5 g/dL (ref 6.5–8.1)

## 2015-04-19 LAB — MRSA PCR SCREENING: MRSA by PCR: NEGATIVE

## 2015-04-19 LAB — BASIC METABOLIC PANEL
Anion gap: 10 (ref 5–15)
BUN: 18 mg/dL (ref 6–20)
CHLORIDE: 103 mmol/L (ref 101–111)
CO2: 24 mmol/L (ref 22–32)
Calcium: 9.2 mg/dL (ref 8.9–10.3)
Creatinine, Ser: 1.29 mg/dL — ABNORMAL HIGH (ref 0.61–1.24)
GFR calc Af Amer: >60 mL/min (ref >60)
GFR calc non Af Amer: >60 mL/min (ref >60)
GLUCOSE: 354 mg/dL — AB (ref 65–99)
Potassium: 3.5 mmol/L (ref 3.5–5.1)
Sodium: 137 mmol/L (ref 135–145)

## 2015-04-19 LAB — RAPID URINE DRUG SCREEN, HOSP PERFORMED
AMPHETAMINES: NOT DETECTED
Barbiturates: NOT DETECTED
Benzodiazepines: NOT DETECTED
Cocaine: NOT DETECTED
OPIATES: NOT DETECTED
Tetrahydrocannabinol: NOT DETECTED

## 2015-04-19 LAB — GLUCOSE, CAPILLARY
GLUCOSE-CAPILLARY: 522 mg/dL — AB (ref 65–99)
GLUCOSE-CAPILLARY: 247 mg/dL — AB (ref 65–99)
GLUCOSE-CAPILLARY: 264 mg/dL — AB (ref 65–99)
GLUCOSE-CAPILLARY: 190 mg/dL — AB (ref 65–99)
Glucose-Capillary: 415 mg/dL — ABNORMAL HIGH (ref 65–99)
Glucose-Capillary: 494 mg/dL — ABNORMAL HIGH (ref 65–99)
Glucose-Capillary: 529 mg/dL — ABNORMAL HIGH (ref 65–99)
Glucose-Capillary: 483 mg/dL — ABNORMAL HIGH (ref 65–99)
Glucose-Capillary: 258 mg/dL — ABNORMAL HIGH (ref 65–99)
Glucose-Capillary: 153 mg/dL — ABNORMAL HIGH (ref 65–99)

## 2015-04-19 LAB — URINE MICROSCOPIC-ADD ON

## 2015-04-19 LAB — PHOSPHORUS: Phosphorus: 2.5 mg/dL (ref 2.5–4.6)

## 2015-04-19 LAB — URINALYSIS, ROUTINE W REFLEX MICROSCOPIC
Bilirubin Urine: NEGATIVE
Glucose, UA: >1000 mg/dL — AB
Hgb urine dipstick: NEGATIVE
KETONES UR: 15 mg/dL — AB
Leukocytes, UA: NEGATIVE
Nitrite: NEGATIVE
Protein, ur: NEGATIVE mg/dL
Specific Gravity, Urine: 1.031 — ABNORMAL HIGH (ref 1.005–1.030)
Urobilinogen, UA: 0.2 mg/dL (ref 0.0–1.0)
pH: 5 (ref 5.0–8.0)

## 2015-04-19 LAB — CBC WITH DIFFERENTIAL/PLATELET
BASOS PCT: 0 % (ref 0–1)
Basophils Absolute: 0 10*3/uL (ref 0.0–0.1)
EOS ABS: 0.1 10*3/uL (ref 0.0–0.7)
EOS PCT: 2 % (ref 0–5)
HEMATOCRIT: 40.8 % (ref 39.0–52.0)
Hemoglobin: 14.7 g/dL (ref 13.0–17.0)
Lymphocytes Relative: 26 % (ref 12–46)
Lymphs Abs: 1.7 10*3/uL (ref 0.7–4.0)
MCH: 32.4 pg (ref 26.0–34.0)
MCHC: 36 g/dL (ref 30.0–36.0)
MCV: 89.9 fL (ref 78.0–100.0)
MONO ABS: 0.4 10*3/uL (ref 0.1–1.0)
Monocytes Relative: 6 % (ref 3–12)
NEUTROS ABS: 4.4 10*3/uL (ref 1.7–7.7)
Neutrophils Relative %: 66 % (ref 43–77)
Platelets: 236 10*3/uL (ref 150–400)
RBC: 4.54 MIL/uL (ref 4.22–5.81)
RDW: 13.5 % (ref 11.5–15.5)
WBC: 6.7 10*3/uL (ref 4.0–10.5)

## 2015-04-19 LAB — CBG MONITORING, ED: Glucose-Capillary: 494 mg/dL — ABNORMAL HIGH (ref 65–99)

## 2015-04-19 LAB — MAGNESIUM: Magnesium: 2 mg/dL (ref 1.7–2.4)

## 2015-04-19 LAB — ETHANOL: Alcohol, Ethyl (B): <5 mg/dL (ref <5)

## 2015-04-19 LAB — BETA-HYDROXYBUTYRIC ACID: Beta-Hydroxybutyric Acid: 0.78 mmol/L — ABNORMAL HIGH (ref 0.05–0.27)

## 2015-04-19 LAB — CREATININE, URINE, RANDOM: CREATININE, URINE: 50.53 mg/dL

## 2015-04-19 MED ORDER — SODIUM CHLORIDE 0.9 % IV BOLUS (SEPSIS): 500.0000 mL | Freq: Once | INTRAVENOUS | Status: DC

## 2015-04-19 MED ORDER — PRAVASTATIN SODIUM 40 MG PO TABS
40.0000 mg | ORAL_TABLET | Freq: Every day | ORAL | Status: DC
  Administered 2015-04-19 – 2015-04-20 (×2): 40 mg via ORAL
  Filled 2015-04-19 (×2): qty 1

## 2015-04-19 MED ORDER — DORZOLAMIDE HCL-TIMOLOL MAL 2-0.5 % OP SOLN
1.0000 [drp] | Freq: Two times a day (BID) | OPHTHALMIC | Status: DC
  Administered 2015-04-19: 1 [drp] via OPHTHALMIC
  Filled 2015-04-19: qty 10

## 2015-04-19 MED ORDER — POTASSIUM CHLORIDE CRYS ER 20 MEQ PO TBCR
20.0000 meq | EXTENDED_RELEASE_TABLET | Freq: Once | ORAL | Status: AC
  Administered 2015-04-19: 20 meq via ORAL
  Filled 2015-04-19: qty 1

## 2015-04-19 MED ORDER — DEXTROSE-NACL 5-0.45 % IV SOLN: INTRAVENOUS | Status: DC

## 2015-04-19 MED ORDER — INSULIN GLARGINE 100 UNIT/ML ~~LOC~~ SOLN
20.0000 [IU] | Freq: Every day | SUBCUTANEOUS | Status: DC
  Administered 2015-04-19: 20 [IU] via SUBCUTANEOUS
  Filled 2015-04-19 (×2): qty 0.2

## 2015-04-19 MED ORDER — DORZOLAMIDE HCL-TIMOLOL MAL 2-0.5 % OP SOLN: 1.0000 [drp] | Freq: Two times a day (BID) | OPHTHALMIC | Status: DC

## 2015-04-19 MED ORDER — INSULIN ASPART 100 UNIT/ML ~~LOC~~ SOLN
0.0000 [IU] | Freq: Three times a day (TID) | SUBCUTANEOUS | Status: DC
Start: 2015-04-20 — End: 2015-04-20
  Administered 2015-04-20: 8 [IU] via SUBCUTANEOUS
  Administered 2015-04-20: 11 [IU] via SUBCUTANEOUS

## 2015-04-19 MED ORDER — ENOXAPARIN SODIUM 40 MG/0.4ML ~~LOC~~ SOLN
40.0000 mg | SUBCUTANEOUS | Status: DC
  Administered 2015-04-19: 40 mg via SUBCUTANEOUS
  Filled 2015-04-19: qty 0.4

## 2015-04-19 MED ORDER — CARVEDILOL 3.125 MG PO TABS
3.1250 mg | ORAL_TABLET | Freq: Two times a day (BID) | ORAL | Status: DC
  Administered 2015-04-19 – 2015-04-20 (×3): 3.125 mg via ORAL
  Filled 2015-04-19 (×3): qty 1

## 2015-04-19 MED ORDER — SODIUM CHLORIDE 0.9 % IV BOLUS (SEPSIS)
1000.0000 mL | Freq: Once | INTRAVENOUS | Status: AC
  Administered 2015-04-19: 1000 mL via INTRAVENOUS

## 2015-04-19 MED ORDER — INSULIN ASPART 100 UNIT/ML ~~LOC~~ SOLN: 0.0000 [IU] | Freq: Every day | SUBCUTANEOUS | Status: DC

## 2015-04-19 MED ORDER — SODIUM CHLORIDE 0.9 % IV BOLUS (SEPSIS)
1000.0000 mL | Freq: Once | INTRAVENOUS | Status: AC
Start: 2015-04-19 — End: 2015-04-19
  Administered 2015-04-19: 1000 mL via INTRAVENOUS

## 2015-04-19 MED ORDER — SODIUM CHLORIDE 0.9 % IV SOLN
INTRAVENOUS | Status: DC
  Filled 2015-04-19: qty 2.5

## 2015-04-19 MED ORDER — SODIUM CHLORIDE 0.9 % IV SOLN: INTRAVENOUS | Status: DC

## 2015-04-19 MED ORDER — POTASSIUM CHLORIDE 10 MEQ/100ML IV SOLN: 10.0000 meq | INTRAVENOUS | Status: DC

## 2015-04-19 MED ORDER — SODIUM CHLORIDE 0.9 % IV SOLN
INTRAVENOUS | Status: DC
  Administered 2015-04-19: 4.6 [IU]/h via INTRAVENOUS
  Filled 2015-04-19: qty 2.5

## 2015-04-19 MED ORDER — SODIUM CHLORIDE 0.9 % IV SOLN
INTRAVENOUS | Status: AC
  Administered 2015-04-19: 12:00:00 via INTRAVENOUS

## 2015-04-19 MED ORDER — DOXYCYCLINE HYCLATE 100 MG PO TABS
100.0000 mg | ORAL_TABLET | Freq: Two times a day (BID) | ORAL | Status: DC
  Administered 2015-04-19 – 2015-04-20 (×3): 100 mg via ORAL
  Filled 2015-04-19 (×3): qty 1

## 2015-04-19 MED ORDER — DIFLUPREDNATE 0.05 % OP EMUL
1.0000 [drp] | Freq: Four times a day (QID) | OPHTHALMIC | Status: DC
  Administered 2015-04-19 (×2): 1 [drp] via OPHTHALMIC

## 2015-04-19 NOTE — Progress Notes (Signed)
Utilization review complete. Pearlina Friedly RN CCM Case Mgmt phone [phone] 

## 2015-04-19 NOTE — H&P (Signed)
Date: 04/19/2015               Patient Name:  Brady Church MRN: [id number]  DOB: Oct 06, 1961 Age / Sex: 54 y.o., male   PCP: Aldean Baker, MD         Medical Service: Internal Medicine Teaching Service         Attending Physician: Dr. Levert Feinstein, MD    First Contact: Dr. Glenard Haring Pager: [phone]  Second Contact: Dr. Mikey Bussing Pager: [phone]       After Hours (After 5p/  First Contact Pager: [phone]  weekends / holidays): Second Contact Pager: [phone]   Chief Complaint: Hyperglycemia, not taking Metformin  History of Present Illness:  54 y.o pmh DM 2 (noncompliant with Metformin due to diarrhea), glaucoma, cataracts, blindness, HTN, substance abuse, HLD, tobacco abuse, EF 40-45% grade 1 DD (12/2014). He presents for high blood sugar.  He used a friends meter yesterday and cbg was 530 and checked b/c he was having increased thirst, urination, fatigue. He took only 1 tablet of Metformin 500 mg and it gave him diarrhea so he has not been taking it.  He is currently living in a homeless shelter and he has to do a lot of walking during the day.  Metformin gave him diarrhea so  he has not been taking it because it is difficult for him to find a bathroom.  He would like to try a different medication for diabetes possibly Glipizide. CBG was 702 on BMET, Cr. 1.37 up from 0.93 01/07/15, AG 14, ketones in the urine. He denies alcohol or drugs.    Meds: Current Facility-Administered Medications  Medication Dose Route Frequency Provider Last Rate Last Dose  . dextrose 5 %-0.45 % sodium chloride infusion   Intravenous Continuous Gwyneth Sprout, MD      . insulin regular (NOVOLIN R,HUMULIN R) 250 Units in sodium chloride 0.9 % 250 mL (1 Units/mL) infusion   Intravenous Continuous Gwyneth Sprout, MD      . sodium chloride 0.9 % bolus 1,000 mL  1,000 mL Intravenous Once Gwyneth Sprout, MD 1,000 mL/hr at 04/19/15 1046 1,000 mL at 04/19/15 1046   Current Outpatient Prescriptions    Medication Sig Dispense Refill  . carvedilol (COREG) 3.125 MG tablet Take 1 tablet (3.125 mg total) by mouth 2 (two) times daily with a meal. 60 tablet 3  . dorzolamide-timolol (COSOPT) 22.3-6.8 MG/ML ophthalmic solution Place 1 drop into the right eye 2 (two) times daily.  3  . DUREZOL 0.05 % EMUL Place 1 drop into the right eye 4 (four) times daily.  12  . hydrochlorothiazide (HYDRODIURIL) 25 MG tablet Take 1 tablet (25 mg total) by mouth daily. 30 tablet 6  . lovastatin (MEVACOR) 40 MG tablet Take 1 tablet (40 mg total) by mouth daily. 30 tablet 11  . metFORMIN (GLUCOPHAGE) 500 MG tablet Take 1 tablet (500 mg total) by mouth 2 (two) times daily with a meal. (Patient not taking: Reported on 04/19/2015) 60 tablet 2  . Potassium Chloride ER 20 MEQ TBCR Take 20 mEq by mouth daily. (Patient not taking: Reported on 03/09/2015) 30 tablet 3    Allergies: Allergies as of 04/19/2015 - Review Complete 04/19/2015  Allergen Reaction Noted  . Metformin and related Diarrhea 04/19/2015   Past Medical History  Diagnosis Date  . Diabetes mellitus   . Hypertension   . Substance abuse     cocaine. Stopped in 2010  . High cholesterol   . Tobacco use disorder  10/07/2014   Past Surgical History  Procedure Laterality Date  . Cataract extraction  01/08/2012    Right eye   Family History  Problem Relation Age of Onset  . Coronary artery disease Mother 1    died of MI  . Cancer Father 59    Some GI cancer. Not sure if it was Colon Cancer or not.  . Hypertension Father   . Diabetes Maternal Grandmother    History   Social History  . Marital Status: Single    Spouse Name: [name]  . Number of Children: [name]  . Years of Education: [name]   Occupational History  . Not on file.   Social History Main Topics  . Smoking status: Current Every Day Smoker -- 0.20 packs/day    Types: Cigarettes    Last Attempt to Quit: 01/16/2006  . Smokeless tobacco: Not on file     Comment:  smokes about 5 a week  .  Alcohol Use: 0.0 oz/week    0 Standard drinks or equivalent per week     Comment: two 40 ounces a week  . Drug Use: No     Comment: Previous cocaine user. Stopped 2 years before.  Marland Kitchen Sexual Activity: Not on file   Other Topics Concern  . Not on file   Social History Narrative   Lives in [place] for last 10 years with "friend"   Is not married and does not have kids.   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    Review of Systems: General: denies changes in appetite  HEENT: denies h/a  Endo: +increased thirst, +increased urination, ?blurry vision  Cardiac: denies chest pain  Pulm: denies sob, +dry cough x 1 week  Abd: denies ab pain, denies nausea/vomiting Ext: wound ? 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 rate 16, weight 253 lb (114.76 kg), SpO2 96 %. Vitals reviewed. General: resting in bed, NAD HEENT: PERRL b/l, dilated pupils b/l, [place]/at, poor dentition, no scleral icterus Cardiac: RRR, no rubs, murmurs or gallops Pulm: clear to auscultation bilaterally, no wheezes, rales, or rhonchi Abd: soft, nontender, nondistended, BS present, obese  Ext: warm and well perfused, no pedal edema, left shin with ? Bite vs inflamed follicle with erythema surrounding  Neuro: alert and oriented X3, cranial nerves II-XII grossly intact, moving all 4 extremities    Lab results: Basic Metabolic Panel:  Recent Labs  16/10/96 0918  NA 131*  K 4.2  CL 92*  CO2 25  GLUCOSE 702*  BUN 22*  CREATININE 1.37*  CALCIUM 9.4   Liver Function Tests:  Recent Labs  04/19/15 0918  AST 29  ALT 23  ALKPHOS 71  BILITOT 1.0  PROT 7.5  ALBUMIN 4.0   CBC:  Recent Labs  04/19/15 0918  WBC 6.7  NEUTROABS 4.4  HGB 14.7  HCT 40.8  MCV 89.9  PLT 236    CBG:  Recent Labs  04/19/15 0906 04/19/15 1045  GLUCAP >600* 494*   Hemoglobin A1C: No results for input(s): HGBA1C in the last 72 hours.  Urine Drug Screen: Drugs of Abuse     Component Value Date/Time   LABOPIA NEG 07/21/2009 2043   COCAINSCRNUR NEG 07/21/2009 2043   LABBENZ NEG 07/21/2009 2043  AMPHETMU NEG 07/21/2009 2043    Alcohol Level: No results for input(s): ETH in the last 72 hours. Urinalysis:  Recent Labs  04/19/15 0940  COLORURINE YELLOW  LABSPEC 1.031*  PHURINE 5.0  GLUCOSEU >1000*  HGBUR NEGATIVE  BILIRUBINUR NEGATIVE  KETONESUR 15*  PROTEINUR NEGATIVE  UROBILINOGEN 0.2  NITRITE NEGATIVE  LEUKOCYTESUR NEGATIVE   Misc. Labs: UDS HIV Ethanol  Urine Urea Urine Cr  HA1C  BMET q4  MAg  Phos  Beta hydroxybutyrate   Imaging results:  No results found.  Other results: EKG: pending  Assessment & Plan by Problem: 54 y.o presents for hyperglycemia due to noncompliance with metformin prior to admission   #Hyperglycemia with history of DM 2 and noncompliant with medications with complications of glaucoma, cataracts ?DKA vs hyperglycemia due to medication noncompliance -will check betahydroxybutyrate, UDS, Ethanol  -DKA protocol -pending HA1C -Metformin 500 mg bid causing diarrhea. Will need another agent may need insulin depending on HA1C -will need meter at home   #AKI on CKD 2  -BL Cr 0.9 -likely secondary to above  -urine lytes, IVF, given 2.5 L IVF in ED  -hold HCTZ for now   #Dry cough -check CXR to r/o pneumonia   #Left leg wound -?bug bite vs inflammed follicle  -will need I&D, warm compress -will start Doxy 100 mg bid    #Hypertension -BP slightly elevated in the ED 154/93  -resumed Coreg 3.125 mg bid,hold HCTZ 25 mg qd in setting of AKI  #Hyperlipidemia -Resume statin   #tobacco use disorder and h/o alcohol abuse -check ethanol, UDS, smoking cessation  -pt does not want a patch  #F/E/N -DKA protocol   -replace electrolytes prn  -NPO   Dispo: Disposition is deferred at this time, awaiting improvement of current medical problems. Anticipated discharge in approximately 1-2 day(s).   The patient does have a current PCP (Alexa Dulcy Fanny, MD) and does need an [facility] hospital follow-up appointment after discharge.  The patient does have transportation limitations that hinder transportation to clinic appointments.  Signed: Annett Gula, MD 04/19/2015, 11:10 AM

## 2015-04-19 NOTE — ED Notes (Signed)
Pt here with c/o high blood sugar. Its been 3 weeks since he last took metformin. Pt c/o increase thirst, urination and fatigue.

## 2015-04-19 NOTE — ED Provider Notes (Addendum)
CSN: [id number]     Arrival date & time 04/19/15  7829 History   First MD Initiated Contact with Patient 04/19/15 [phone]     Chief Complaint  Patient presents with  . Hyperglycemia  . Fatigue     (Consider location/radiation/quality/duration/timing/severity/associated sxs/prior Treatment) HPI Comments: Patient states that he is currently living in a homeless shelter and he has to do a lot of walking during the day. Metformin gives him diarrhea so for the last 3 weeks he has not been taking it because he is not close to a bathroom. He is having increased thirst, urination and fatigue but denies other symptoms. He is requesting a different medication for blood sugar control as he feels that he cannot tolerate the metformin.  Patient is a 54 y.o. male presenting with hyperglycemia. The history is provided by the patient.  Hyperglycemia Blood sugar level PTA:  530 Severity:  Severe Onset quality:  Gradual Duration:  3 weeks Timing:  Constant Progression:  Worsening Chronicity:  New Diabetes status:  Controlled with oral medications Current diabetic therapy:  Metformin Time since last antidiabetic medication:  3 weeks Context: noncompliance   Relieved by:  None tried Ineffective treatments:  None tried Associated symptoms: fatigue, increased thirst and polyuria   Associated symptoms: no abdominal pain, no altered mental status, no blurred vision, no chest pain, no confusion, no fever, no nausea, no shortness of breath and no weakness   Risk factors comment:  Pt with diabetes   Past Medical History  Diagnosis Date  . Diabetes mellitus   . Hypertension   . Substance abuse     cocaine. Stopped in 2010  . High cholesterol   . Tobacco use disorder 10/07/2014   Past Surgical History  Procedure Laterality Date  . Cataract extraction  01/08/2012    Right eye   Family History  Problem Relation Age of Onset  . Coronary artery disease Mother 49    died of MI  . Cancer Father 87   Some GI cancer. Not sure if it was Colon Cancer or not.  . Hypertension Father   . Diabetes Maternal Grandmother    History  Substance Use Topics  . Smoking status: Current Every Day Smoker -- 0.20 packs/day    Types: Cigarettes    Last Attempt to Quit: 01/16/2006  . Smokeless tobacco: Not on file     Comment:  smokes about 5 a week  . Alcohol Use: 0.0 oz/week    0 Standard drinks or equivalent per week     Comment: two 40 ounces a week    Review of Systems  Constitutional: Positive for fatigue. Negative for fever.  Eyes: Negative for blurred vision.  Respiratory: Negative for shortness of breath.   Cardiovascular: Negative for chest pain.  Gastrointestinal: Negative for nausea and abdominal pain.  Endocrine: Positive for polydipsia and polyuria.  Neurological: Negative for weakness.  Psychiatric/Behavioral: Negative for confusion.  All other systems reviewed and are negative.     Allergies  Review of patient's allergies indicates no known allergies.  Home Medications   Prior to Admission medications   Medication Sig Start Date End Date Taking? Authorizing Provider  bimatoprost (LUMIGAN) 0.03 % ophthalmic solution Place 1 drop into both eyes at bedtime.    Historical Provider, MD  brimonidine (ALPHAGAN) 0.2 % ophthalmic solution Place 1 drop into both eyes 2 (two) times daily.    Historical Provider, MD  carvedilol (COREG) 3.125 MG tablet Take 1 tablet (3.125 mg total) by mouth  2 (two) times daily with a meal. 01/07/15   Alexa Dulcy Fanny, MD  dorzolamide (TRUSOPT) 2 % ophthalmic solution Place 1 drop into both eyes 2 (two) times daily.    Historical Provider, MD  hydrochlorothiazide (HYDRODIURIL) 25 MG tablet Take 1 tablet (25 mg total) by mouth daily. 01/07/15   Alexa Dulcy Fanny, MD  lovastatin (MEVACOR) 40 MG tablet Take 1 tablet (40 mg total) by mouth daily. 01/08/15 01/08/16  Alexa Dulcy Fanny, MD  metFORMIN (GLUCOPHAGE) 500 MG tablet Take 1 tablet (500 mg total) by  mouth 2 (two) times daily with a meal. 03/18/15   Annett Gula, MD  Potassium Chloride ER 20 MEQ TBCR Take 20 mEq by mouth daily. Patient not taking: Reported on 03/09/2015 11/10/14   Alexa Dulcy Fanny, MD   BP 132/91 mmHg  Pulse 90  Temp(Src) 98 F (36.7 C) (Oral)  Resp 16  Wt 253 lb (114.76 kg)  SpO2 98% Physical Exam  Constitutional: He is oriented to person, place, and time. He appears well-developed and well-nourished. No distress.  HENT:  Head: Normocephalic and atraumatic.  Mouth/Throat: Oropharynx is clear and moist.  Eyes: Conjunctivae are normal.  Left eye opacity with decreased vision  Neck: Normal range of motion. Neck supple.  Cardiovascular: Normal rate, regular rhythm and intact distal pulses.   No murmur heard. Pulmonary/Chest: Effort normal and breath sounds normal. No respiratory distress. He has no wheezes. He has no rales.  Abdominal: Soft. He exhibits no distension. There is no tenderness. There is no rebound and no guarding.  Musculoskeletal: Normal range of motion. He exhibits no edema or tenderness.  Neurological: He is alert and oriented to person, place, and time.  Skin: Skin is warm and dry. No rash noted. No erythema.     Psychiatric: He has a normal mood and affect. His behavior is normal.  Nursing note and vitals reviewed.   ED Course  Procedures (including critical care time) Labs Review Labs Reviewed  COMPREHENSIVE METABOLIC PANEL - Abnormal; Notable for the following:    Sodium 131 (*)    Chloride 92 (*)    Glucose, Bld 702 (*)    BUN 22 (*)    Creatinine, Ser 1.37 (*)    GFR calc non Af Amer 58 (*)    All other components within normal limits  CBG MONITORING, ED - Abnormal; Notable for the following:    Glucose-Capillary >600 (*)    All other components within normal limits  CBC WITH DIFFERENTIAL/PLATELET  URINALYSIS, ROUTINE W REFLEX MICROSCOPIC    Imaging Review No results found.   EKG Interpretation None      MDM    Final diagnoses:  Hyperglycemia    Patient presents today with complaints of hyperglycemia. He states for the last 2-3 weeks he has not been taking his metformin because he is been living in a shelter and has to do a lot of walking. The metformin gives him diarrhea and he was not going to be close to a bathroom. Yesterday when he checked his blood sugar is in the mid 500s and today was undetectable. He has not taken metformin today. He denies any fever, abdominal pain, nausea, vomiting, shortness of breath or chest pain. He does complain of polydipsia, polyuria and fatigue.  Low suspicion the patient in DKA at this time or hyperosmolar coma. Will treat his hyperglycemia and consider changing him to glipizide as patient does not tolerate metformin well. He is still currently taking his blood pressure medication,  eyedrops and lovastatin.  10:41 AM Patient with lab work showing a blood sugar of 700 as well as doubling of his creatinine which is most likely all related to dehydration. Patient started on glucose stabilizer and admitted to internal medicine. At this time no signs of DKA.  CRITICAL CARE Performed by: Gwyneth Sprout Total critical care time: 30 Critical care time was exclusive of separately billable procedures and treating other patients. Critical care was necessary to treat or prevent imminent or life-threatening deterioration. Critical care was time spent personally by me on the following activities: development of treatment plan with patient and/or surrogate as well as nursing, discussions with consultants, evaluation of patient's response to treatment, examination of patient, obtaining history from patient or surrogate, ordering and performing treatments and interventions, ordering and review of laboratory studies, ordering and review of radiographic studies, pulse oximetry and re-evaluation of patient's condition.   Gwyneth Sprout, MD 04/19/15 1041  Gwyneth Sprout,  MD 04/19/15 1047

## 2015-04-20 ENCOUNTER — Encounter: Payer: Self-pay | Admitting: Dietician

## 2015-04-20 LAB — BASIC METABOLIC PANEL
Anion gap: 11 (ref 5–15)
BUN: 17 mg/dL (ref 6–20)
CHLORIDE: 102 mmol/L (ref 101–111)
CO2: 26 mmol/L (ref 22–32)
CREATININE: 0.91 mg/dL (ref 0.61–1.24)
Calcium: 9 mg/dL (ref 8.9–10.3)
GFR calc Af Amer: >60 mL/min (ref >60)
Glucose, Bld: 263 mg/dL — ABNORMAL HIGH (ref 65–99)
Potassium: 3.6 mmol/L (ref 3.5–5.1)
SODIUM: 139 mmol/L (ref 135–145)

## 2015-04-20 LAB — GLUCOSE, CAPILLARY
GLUCOSE-CAPILLARY: 273 mg/dL — AB (ref 65–99)
Glucose-Capillary: 320 mg/dL — ABNORMAL HIGH (ref 65–99)
Glucose-Capillary: 432 mg/dL — ABNORMAL HIGH (ref 65–99)
Glucose-Capillary: 305 mg/dL — ABNORMAL HIGH (ref 65–99)

## 2015-04-20 LAB — HIV ANTIBODY (ROUTINE TESTING W REFLEX): HIV Screen 4th Generation wRfx: NONREACTIVE

## 2015-04-20 LAB — HEMOGLOBIN A1C
Hgb A1c MFr Bld: 10.6 % — ABNORMAL HIGH (ref 4.8–5.6)
Mean Plasma Glucose: 258 mg/dL

## 2015-04-20 LAB — UREA NITROGEN, URINE: UREA NITROGEN UR: 386 mg/dL

## 2015-04-20 MED ORDER — PIOGLITAZONE HCL 15 MG PO TABS
15.0000 mg | ORAL_TABLET | Freq: Every day | ORAL | Status: DC
  Administered 2015-04-20: 15 mg via ORAL
  Filled 2015-04-20: qty 1

## 2015-04-20 MED ORDER — DOUBLE ANTIBIOTIC 500-10000 UNIT/GM EX OINT
TOPICAL_OINTMENT | Freq: Two times a day (BID) | CUTANEOUS | Status: DC
  Administered 2015-04-20: 12:00:00 via TOPICAL
  Filled 2015-04-20: qty 14.17

## 2015-04-20 MED ORDER — ACCU-CHEK FASTCLIX LANCETS MISC: 1.0000 | Freq: Two times a day (BID) | Status: DC

## 2015-04-20 MED ORDER — GLIMEPIRIDE 1 MG PO TABS
2.0000 mg | ORAL_TABLET | Freq: Every day | ORAL | Status: DC
  Administered 2015-04-20: 2 mg via ORAL
  Filled 2015-04-20: qty 2

## 2015-04-20 MED ORDER — GLUCOSE BLOOD VI STRP: ORAL_STRIP | Status: DC

## 2015-04-20 MED ORDER — PIOGLITAZONE HCL 15 MG PO TABS: 15.0000 mg | ORAL_TABLET | Freq: Every day | ORAL | Status: DC

## 2015-04-20 MED ORDER — DOUBLE ANTIBIOTIC 500-10000 UNIT/GM EX OINT
TOPICAL_OINTMENT | Freq: Two times a day (BID) | CUTANEOUS | Status: DC
  Filled 2015-04-20 (×18): qty 1

## 2015-04-20 MED ORDER — DOXYCYCLINE HYCLATE 100 MG PO TABS: 100.0000 mg | ORAL_TABLET | Freq: Two times a day (BID) | ORAL | Status: AC

## 2015-04-20 MED ORDER — GLIMEPIRIDE 2 MG PO TABS: 2.0000 mg | ORAL_TABLET | Freq: Every day | ORAL | Status: DC

## 2015-04-20 MED ORDER — ACETAMINOPHEN 325 MG PO TABS
650.0000 mg | ORAL_TABLET | Freq: Four times a day (QID) | ORAL | Status: DC | PRN
  Administered 2015-04-20: 650 mg via ORAL
  Filled 2015-04-20: qty 2

## 2015-04-20 MED ORDER — HYDROCHLOROTHIAZIDE 25 MG PO TABS
25.0000 mg | ORAL_TABLET | Freq: Every day | ORAL | Status: DC
Start: 2015-04-20 — End: 2015-04-20
  Administered 2015-04-20: 25 mg via ORAL
  Filled 2015-04-20: qty 1

## 2015-04-20 NOTE — Care Management (Signed)
CM did receive referral in ref to Glucose Meter and medications. Pt has insurance and CM will not be able to assist with Medications. CM did make RN aware that [employer] has a Glucose Meter ReliOn Brand that should be no more than $25.00. CM did reach out to [facility] for [facility] to see if they can assist with additional resources once d/c. No further needs from CM at this time. Gala Lewandowsky , RN,BSN [phone]

## 2015-04-20 NOTE — Discharge Summary (Signed)
Name: Brady Church MRN: [id number] DOB: 04-Oct-1961 54 y.o. PCP: Brady Baker, MD  Date of Admission: 04/19/2015  8:55 AM Date of Discharge: 04/20/2015 Attending Physician: Brady Feinstein, MD  Discharge Diagnosis: Principal Problem:   Diabetic ketoacidosis Active Problems:   GERD (gastroesophageal reflux disease)   Hypertension   Hyperlipidemia LDL goal <100   Tobacco use disorder   Alcohol abuse   Diabetes mellitus with diabetic cataract   AKI (acute kidney injury)   Dry cough   Leg wound, left  Discharge Medications:   Medication List    STOP taking these medications        metFORMIN 500 MG tablet  Commonly known as:  GLUCOPHAGE      TAKE these medications        ACCU-CHEK FASTCLIX LANCETS Misc  1 each by Does not apply route 2 (two) times daily.     carvedilol 3.125 MG tablet  Commonly known as:  COREG  Take 1 tablet (3.125 mg total) by mouth 2 (two) times daily with a meal.     dorzolamide-timolol 22.3-6.8 MG/ML ophthalmic solution  Commonly known as:  COSOPT  Place 1 drop into the right eye 2 (two) times daily.     doxycycline 100 MG tablet  Commonly known as:  VIBRA-TABS  Take 1 tablet (100 mg total) by mouth every 12 (twelve) hours.     DUREZOL 0.05 % Emul  Generic drug:  Difluprednate  Place 1 drop into the right eye 4 (four) times daily.     glimepiride 2 MG tablet  Commonly known as:  AMARYL  Take 1 tablet (2 mg total) by mouth daily with breakfast.     glucose blood test strip  Commonly known as:  ACCU-CHEK SMARTVIEW  Use as instructed     hydrochlorothiazide 25 MG tablet  Commonly known as:  HYDRODIURIL  Take 1 tablet (25 mg total) by mouth daily.     lovastatin 40 MG tablet  Commonly known as:  MEVACOR  Take 1 tablet (40 mg total) by mouth daily.     pioglitazone 15 MG tablet  Commonly known as:  ACTOS  Take 1 tablet (15 mg total) by mouth daily.     Potassium Chloride ER 20 MEQ Tbcr  Take 20 mEq by mouth daily.        Disposition and follow-up:   Mr.Brady Church was discharged from [facility] in Stable condition.  At the hospital follow up visit please address:  1.  Tolerating new diabetes medications, blood sugar control, resolution of cellulitis.  2.  Labs / imaging needed at time of follow-up: CBG.  3.  Pending labs/ test needing follow-up: None.  Follow-up Appointments: Follow-up Information    Follow up with Brady Meeker, MD. Go on 04/23/2015.   Specialty:  Internal Medicine   Why:  9:30 am   Contact information:   [address] [place] [address] [phone]       Discharge Instructions: Thank you for allowing [place] to be involved in your healthcare while you were hospitalized at [facility].  Please note that there have been changes to your home medications. --> PLEASE LOOK AT YOUR DISCHARGE MEDICATION LIST FOR DETAILS. Please call your PCP if you have any questions or concerns, or any difficulty getting any of your medications.  Please return to the ER if you have worsening of your symptoms or new severe symptoms arise.  We have started you on two new  medications to control your diabetes called pioglitazone and glimepiride. It will be important that you keep a close eye on your sugars while you are on these medications to determine if we need to make any changes. Let us know if your blood sugar is persistently running less than 100.  Check your blood sugar if you feel shaky, nervous, sweaty, lightheaded, or weak.  Make sure to follow up in our clinic on Friday.  We would also like you to continue taking doxycyline to treat your skin infection for the next 5 days. You will need to take this medication twice per day. Discharge Instructions    Amb Referral to DSME/T    Complete by:  As directed   Choose type of training services and number of hours requested:  Initial DSME/T:  10 hours  Check all special needs that apply to patient requiring 1 on  1 DSME/T:   [employer]    DSME/T Content:   Monitoring Diabetes Nutritional management Medications Prevent, detect and treat acute complications Prevent, detect and treat chronic complications       Call MD for:  difficulty breathing, headache or visual disturbances    Complete by:  As directed      Call MD for:  extreme fatigue    Complete by:  As directed      Call MD for:  hives    Complete by:  As directed      Call MD for:  persistant dizziness or light-headedness    Complete by:  As directed      Call MD for:  persistant nausea and vomiting    Complete by:  As directed      Call MD for:  redness, tenderness, or signs of infection (pain, swelling, redness, odor or green/yellow discharge around incision site)    Complete by:  As directed      Call MD for:  severe uncontrolled pain    Complete by:  As directed      Call MD for:  temperature >100.4    Complete by:  As directed      Diet - low sodium heart healthy    Complete by:  As directed      Increase activity slowly    Complete by:  As directed            Consultations: Diabetes coordinator.  Procedures Performed:  Dg Chest 2 View  04/19/2015   CLINICAL DATA:  Initial encounter for 3 day history of dry cough.  EXAM: CHEST  2 VIEW  COMPARISON:  None.  FINDINGS: The lungs are clear without focal infiltrate, edema, pneumothorax or pleural effusion. The cardiopericardial silhouette is within normal limits for size. Imaged bony structures of the thorax are intact. Telemetry leads overlie the chest.  IMPRESSION: No active cardiopulmonary disease.   Electronically Signed   By: Brady Church M.D.   On: 04/19/2015 13:01   Admission HPI:  54 y.o pmh DM 2 (noncompliant with Metformin due to diarrhea), glaucoma, cataracts, blindness, HTN, substance abuse, HLD, tobacco abuse, EF 40-45% grade 1 DD (12/2014). He presents for high blood sugar. He used a friends meter yesterday and cbg was  530 and checked b/c he was having increased thirst, urination, fatigue. He took only 1 tablet of Metformin 500 mg and it gave him diarrhea so he has not been taking it. He is currently living in a homeless shelter and he has to do a lot of walking during the  day. Metformin gave him diarrhea so he has not been taking it because it is difficult for him to find a bathroom. He would like to try a different medication for diabetes possibly Glipizide. CBG was 702 on BMET, Cr. 1.37 up from 0.93 01/07/15, AG 14, ketones in the urine. He denies alcohol or drugs.  Hospital Course by problem list: Principal Problem:   Diabetic ketoacidosis Active Problems:   GERD (gastroesophageal reflux disease)   Hypertension   Hyperlipidemia LDL goal <100   Tobacco use disorder   Alcohol abuse   Diabetes mellitus with diabetic cataract   AKI (acute kidney injury)   Dry cough   Leg wound, left   #Diabetic ketoacidosis The patient presented with increased thirst, urination, and fatigue. He had not been taking his home metformin due to diarrhea, which is a issue for him because he currently lives in a homeless shelter. His blood sugar was noted to be elevated to 702 with an anion gap of 14, ketones in the urine, and an elevated beta hydroxybutyric acid, consistent with diabetic ketoacidosis. His hemoglobin A1c was noted to be elevated to 10.6, up from 7.3 one month ago. He was started on an insulin drip with improvement of his blood sugars and closure of his anion gap. He was transitioned to subcutaneous Lantus. However, prior to discharge the patient was switched to oral glimepiride and pioglitazone due to his inability to store and take insulin at the homeless shelter. He tolerated these medications well prior to discharge. Of note, the patient had an echocardiogram performed in February to workup angina that showed an ejection fraction of 40-45%, but he did not complain of any heart failure symptoms. Due to his decreased  ejection fraction, he was started on the lowest dose of pioglitazone 15 mg daily because this has been associated heart failure exacerbations. Other options were considered, but his current living situation and comorbidities precluded most other oral diabetes medications.  The [employer] diabetes educator provided the patient with a glucometer to check his blood sugars, and an ambulatory referral for diabetes education was placed at discharge. He had some difficulty seeing the meter, but he reported that he was going to have a friend help him check his blood sugars. Close follow-up was arranged in the [facility] clinic to further titrate his insulin regimen.  #Cellulitis The patient was noted to have what appeared to be an insect bite on his left shin surrounded by approximately 3 cm of erythema. He was unsure when this had developed. There was a small area of fluctuance that drained spontaneously during the hospitalization.  He was started on doxycycline 100 mg twice a day for 7 days to treat the infection. At follow-up, please check for resolution.  #Acute on chronic kidney disease The patient's creatinine was noted to be elevated to 1.37 from his baseline of 0.9 on presentation, likely due to dehydration from his DKA. The patient was given IV fluids with return of his creatinine to his baseline.  #Upper respiratory tract infection The patient reported a dry cough that he attributed to contact with other residents at the homeless shelter. Chest x-ray was performed and showed no evidence of infection. As result, he was treated symptomatically.  Discharge Vitals:   BP 148/98 mmHg  Pulse 87  Temp(Src) 97.9 F (36.6 C) (Oral)  Resp 18  Ht  (1.727 m)  Wt 244 lb 1.6 oz (110.723 kg)  BMI 37.12 kg/m2  SpO2 99%  Discharge Labs:  Results for orders  placed or performed during the hospital encounter of 04/19/15 (from the past 24 hour(s))  Glucose, capillary     Status: Abnormal   Collection Time: 04/19/15   4:05 PM  Result Value Ref Range   Glucose-Capillary 483 (H) 65 - 99 mg/dL   Comment 1 Notify RN    Comment 2 Document in Chart   HIV antibody     Status: None   Collection Time: 04/19/15  4:49 PM  Result Value Ref Range   HIV Screen 4th Generation wRfx Non Reactive Non Reactive  Basic metabolic panel (stat then every 4 hours)     Status: Abnormal   Collection Time: 04/19/15  4:49 PM  Result Value Ref Range   Sodium 137 135 - 145 mmol/L   Potassium 3.5 3.5 - 5.1 mmol/L   Chloride 103 101 - 111 mmol/L   CO2 24 22 - 32 mmol/L   Glucose, Bld 354 (H) 65 - 99 mg/dL   BUN 18 6 - 20 mg/dL   Creatinine, Ser 1.61 (H) 0.61 - 1.24 mg/dL   Calcium 9.2 8.9 - 09.6 mg/dL   GFR calc non Af Amer >60 >60 mL/min   GFR calc Af Amer >60 >60 mL/min   Anion gap 10 5 - 15  Magnesium     Status: None   Collection Time: 04/19/15  4:49 PM  Result Value Ref Range   Magnesium 2.0 1.7 - 2.4 mg/dL  Phosphorus     Status: None   Collection Time: 04/19/15  4:49 PM  Result Value Ref Range   Phosphorus 2.5 2.5 - 4.6 mg/dL  Hemoglobin E4V     Status: Abnormal   Collection Time: 04/19/15  4:49 PM  Result Value Ref Range   Hgb A1c MFr Bld 10.6 (H) 4.8 - 5.6 %   Mean Plasma Glucose 258 mg/dL  Glucose, capillary     Status: Abnormal   Collection Time: 04/19/15  5:14 PM  Result Value Ref Range   Glucose-Capillary 258 (H) 65 - 99 mg/dL  Urea nitrogen, urine     Status: None   Collection Time: 04/19/15  5:19 PM  Result Value Ref Range   Urea Nitrogen, Ur 386 Not Estab. mg/dL  Creatinine, urine, random     Status: None   Collection Time: 04/19/15  5:19 PM  Result Value Ref Range   Creatinine, Urine 50.53 mg/dL  Urine rapid drug screen (hosp performed)     Status: None   Collection Time: 04/19/15  5:19 PM  Result Value Ref Range   Opiates NONE DETECTED NONE DETECTED   Cocaine NONE DETECTED NONE DETECTED   Benzodiazepines NONE DETECTED NONE DETECTED   Amphetamines NONE DETECTED NONE DETECTED    Tetrahydrocannabinol NONE DETECTED NONE DETECTED   Barbiturates NONE DETECTED NONE DETECTED  Glucose, capillary     Status: Abnormal   Collection Time: 04/19/15  6:07 PM  Result Value Ref Range   Glucose-Capillary 247 (H) 65 - 99 mg/dL  Glucose, capillary     Status: Abnormal   Collection Time: 04/19/15  7:03 PM  Result Value Ref Range   Glucose-Capillary 264 (H) 65 - 99 mg/dL  Glucose, capillary     Status: Abnormal   Collection Time: 04/19/15  8:11 PM  Result Value Ref Range   Glucose-Capillary 190 (H) 65 - 99 mg/dL  Glucose, capillary     Status: Abnormal   Collection Time: 04/19/15 10:21 PM  Result Value Ref Range   Glucose-Capillary 153 (H) 65 - 99 mg/dL  Basic metabolic panel     Status: Abnormal   Collection Time: 04/20/15  4:05 AM  Result Value Ref Range   Sodium 139 135 - 145 mmol/L   Potassium 3.6 3.5 - 5.1 mmol/L   Chloride 102 101 - 111 mmol/L   CO2 26 22 - 32 mmol/L   Glucose, Bld 263 (H) 65 - 99 mg/dL   BUN 17 6 - 20 mg/dL   Creatinine, Ser 1.61 0.61 - 1.24 mg/dL   Calcium 9.0 8.9 - 09.6 mg/dL   GFR calc non Af Amer >60 >60 mL/min   GFR calc Af Amer >60 >60 mL/min   Anion gap 11 5 - 15  Glucose, capillary     Status: Abnormal   Collection Time: 04/20/15  7:31 AM  Result Value Ref Range   Glucose-Capillary 320 (H) 65 - 99 mg/dL   Comment 1 Notify RN    Comment 2 Document in Chart   Glucose, capillary     Status: Abnormal   Collection Time: 04/20/15 11:32 AM  Result Value Ref Range   Glucose-Capillary 273 (H) 65 - 99 mg/dL   Comment 1 Notify RN    Comment 2 Document in Chart   Glucose, capillary     Status: Abnormal   Collection Time: 04/20/15  2:38 PM  Result Value Ref Range   Glucose-Capillary 432 (H) 65 - 99 mg/dL    Signed: Donavan Foil, MD 04/20/2015, 3:35 PM    Services Ordered on Discharge: None. Equipment Ordered on Discharge: None.

## 2015-04-20 NOTE — Discharge Instructions (Addendum)
·   Thank you for allowing [place] to be involved in your healthcare while you were hospitalized at [facility].   Please note that there have been changes to your home medications.  --> PLEASE LOOK AT YOUR DISCHARGE MEDICATION LIST FOR DETAILS.   Please call your PCP if you have any questions or concerns, or any difficulty getting any of your medications.  Please return to the ER if you have worsening of your symptoms or new severe symptoms arise.  We have started you on two new medications to control your diabetes called pioglitazone and glimepiride.  It will be important that you keep a close eye on your sugars while you are on these medications to determine if we need to make any changes.  Let us know if your blood sugar is persistently running less than 100.  Check your blood sugar if you feel shaky, nervous, sweaty, lightheaded, or weak.  Make sure to follow up in our clinic on Friday.  We would also like you to continue taking doxycyline to treat your skin infection for the next 5 days.  You will need to take this medication twice per day.

## 2015-04-20 NOTE — Progress Notes (Signed)
Subjective:    Currently, the patient reports doing well this morning. He is tolerating a full diet without issue.  Interval Events: -Transition off of insulin drip with Lantus 20 units, sliding scale insulin moderate.  -Blood sugar improved this morning to 263, gap remains closed.  -Blood pressure elevated to 171/97 off of home medications.    Objective:    Vital Signs:   Temp:  [97.6 F (36.4 C)-98.8 F (37.1 C)] 97.9 F (36.6 C) (05/24 0758) Pulse Rate:  [75-89] 87 (05/24 1030) Resp:  [16-18] 18 (05/24 0758) BP: (137-171)/(70-107) 148/98 mmHg (05/24 1030) SpO2:  [96 %-100 %] 99 % (05/24 0758) Weight:  [241 lb 11.2 oz (109.634 kg)-244 lb 1.6 oz (110.723 kg)] 244 lb 1.6 oz (110.723 kg) (05/24 0445) Last BM Date: 04/19/15  24-hour weight change: Weight change:   Intake/Output:   Intake/Output Summary (Last 24 hours) at 04/20/15 1103 Last data filed at 04/20/15 0800  Gross per 24 hour  Intake 1140.27 ml  Output   2350 ml  Net -1209.73 ml      Physical Exam: General: Well-developed, well-nourished, in no acute distress; alert, appropriate and cooperative throughout examination. Reduced vision bilaterally, R pupil larger than L, s/p surgery.  Lungs:  Normal respiratory effort. Clear to auscultation BL without crackles or wheezes.  Heart: RRR. S1 and S2 normal without gallop, murmur, or rubs.  Abdomen:  BS normoactive. Soft, Nondistended, non-tender.  No masses or organomegaly.  Extremities: No pretibial edema. Ruptured pustule on left shin with reduced erythema.      Labs:  Basic Metabolic Panel:  Recent Labs Lab 04/19/15 0918 04/19/15 1649 04/20/15 0405  NA 131* 137 139  K 4.2 3.5 3.6  CL 92* 103 102  CO2 25 24 26   GLUCOSE 702* 354* 263*  BUN 22* 18 17  CREATININE 1.37* 1.29* 0.91  CALCIUM 9.4 9.2 9.0  MG  --  2.0  --   PHOS  --  2.5  --     Liver Function Tests:  Recent Labs Lab 04/19/15 0918  AST 29  ALT 23  ALKPHOS 71  BILITOT 1.0  PROT  7.5  ALBUMIN 4.0   CBC:  Recent Labs Lab 04/19/15 0918  WBC 6.7  NEUTROABS 4.4  HGB 14.7  HCT 40.8  MCV 89.9  PLT 236   CBG:  Recent Labs Lab 04/19/15 1807 04/19/15 1903 04/19/15 2011 04/19/15 2221 04/20/15 0731  GLUCAP 247* 264* 190* 153* 320*    Beta hydroxybutyric acid: 0.78  Drugs of Abuse     Component Value Date/Time   LABOPIA NONE DETECTED 04/19/2015 1719   LABOPIA NEG 07/21/2009 2043   COCAINSCRNUR NONE DETECTED 04/19/2015 1719   COCAINSCRNUR NEG 07/21/2009 2043   LABBENZ NONE DETECTED 04/19/2015 1719   LABBENZ NEG 07/21/2009 2043   AMPHETMU NONE DETECTED 04/19/2015 1719   AMPHETMU NEG 07/21/2009 2043   THCU NONE DETECTED 04/19/2015 1719   LABBARB NONE DETECTED 04/19/2015 1719    HIV antibody: Negative.  Alcohol Level    Component Value Date/Time   [facility] <5 04/19/2015 1134    Microbiology: Results for orders placed or performed during the hospital encounter of 04/19/15  MRSA PCR Screening     Status: None   Collection Time: 04/19/15 12:23 PM  Result Value Ref Range Status   MRSA by PCR NEGATIVE NEGATIVE Final    Comment:        The GeneXpert MRSA Assay (FDA approved for NASAL specimens only), is one component of  a comprehensive MRSA colonization surveillance program. It is not intended to diagnose MRSA infection nor to guide or monitor treatment for MRSA infections.     Other results: EKG: Sinus rhythm, abnormal R-wave progression, T-wave inversion in lateral leads-unchanged from prior.  Imaging: Dg Chest 2 View  04/19/2015   CLINICAL DATA:  Initial encounter for 3 day history of dry cough.  EXAM: CHEST  2 VIEW  COMPARISON:  None.  FINDINGS: The lungs are clear without focal infiltrate, edema, pneumothorax or pleural effusion. The cardiopericardial silhouette is within normal limits for size. Imaged bony structures of the thorax are intact. Telemetry leads overlie the chest.  IMPRESSION: No active cardiopulmonary disease.    Electronically Signed   By: Kennith Center M.D.   On: 04/19/2015 13:01       Medications:    Infusions: . sodium chloride    . insulin (NOVOLIN-R) infusion Stopped (04/19/15 2118)    Scheduled Medications: . carvedilol  3.125 mg Oral BID WC  . Difluprednate  1 drop Right Eye QID  . dorzolamide-timolol  1 drop Right Eye BID  . doxycycline  100 mg Oral Q12H  . enoxaparin (LOVENOX) injection  40 mg Subcutaneous Q24H  . glimepiride  2 mg Oral Q breakfast  . hydrochlorothiazide  25 mg Oral Daily  . insulin aspart  0-15 Units Subcutaneous TID WC  . insulin aspart  0-5 Units Subcutaneous QHS  . pioglitazone  15 mg Oral Daily  . polymixin-bacitracin   Topical BID  . pravastatin  40 mg Oral q1800  . sodium chloride  500 mL Intravenous Once    PRN Medications: acetaminophen   Assessment/ Plan:    Principal Problem:   Hyperglycemia Active Problems:   GERD (gastroesophageal reflux disease)   Hypertension   Hyperlipidemia LDL goal <100   Tobacco use disorder   Alcohol abuse   Diabetes mellitus with diabetic cataract   AKI (acute kidney injury)   Dry cough   Leg wound, left  #Diabetic ketoacidosis Beta hydroxybutyric acid elevated and ketones in urine consistent with ketoacidosis. His blood sugar has improved significantly this morning. Hemoglobin A1c elevated at 10.6, up from 7.3 one month ago. He would ideally be started on insulin, but he is unable to take it due to being homeless with no place to story. In addition, he is blind and unable to accurately inject the correct dose. Discussed with the diabetes educator who agrees. As result, we will start him on to oral medications. Due to his diarrhea with metformin, will start with glimepiride due to less likelihood of GI toxicity. In addition, we'll start him on pioglitazone. He does not have a history of heart failure, but echo ordered and February due to angina showed reduced ejection fraction. As result, we will start with the  lowest dose of 15 mg daily. Other options have contraindications or would be too difficult due to need for injection. -Start glimepiride 2 mg daily and pioglitazone 15 mg daily now. -Monitor this afternoon for blood sugar control and tolerance. -We'll have our diabetes coordinator stop by to give him a glucometer. -Possible discharge home this afternoon if blood sugars remain stable on new regimen. -We'll arrange follow-up in our clinic this week.  #AKI on CKD 2  Creatinine back to baseline this morning. -Stop IV fluids. -Resume home BP meds.  #Dry cough Chest x-ray unremarkable. Likely URI. -Supportive management.  #Left leg wound Spontaneous drained this morning with improved erythema. -Continue doxycycline 100 mg twice a day, day  2 of 7.  #Hypertension Blood pressure elevated this morning off of home medications. AKI resolved. -Continue home Coreg 3.125 mg twice a day. -Restart home HCTZ 25 mg daily.  #Hyperlipidemia -Continue home pravastatin.  #Tobacco use disorder and h/o alcohol abuse Ethanol and UDS negative. -Encouraged cessation of smoking.   DVT PPX - low molecular weight heparin  CODE STATUS - Full.  CONSULTS PLACED - None.  DISPO - Possible discharge home this afternoon.   The patient does have a current PCP Senaida Ores, Alexa, MD) and does need an [facility] hospital follow-up appointment after discharge.    Is the [facility] hospital follow-up appointment a one-time only appointment? no.  Does the patient have transportation limitations that hinder transportation to clinic appointments? yes   SERVICE NEEDED AT DISCHARGE - TO BE DETERMINED DURING HOSPITAL COURSE         Y = Yes, Blank = No PT:   OT:   RN:   Equipment:   Other:      Length of Stay: 1 day(s)   Signed: Donavan Foil, MD  PGY-1, Internal Medicine Resident Pager: [phone] (7AM-5PM) 04/20/2015, 11:03 AM

## 2015-04-20 NOTE — Progress Notes (Signed)
Inpatient Diabetes Program Recommendations  AACE/ADA: New Consensus Statement on Inpatient Glycemic Control (2013)  Target Ranges:  Prepandial:   less than 140 mg/dL      Peak postprandial:   less than 180 mg/dL (1-2 hours)      Critically ill patients:  140 - 180 mg/dL   Results for UMER, LUBELL (MRN [id number]) as of 04/20/2015 08:58  Ref. Range 04/19/2015 18:07 04/19/2015 19:03 04/19/2015 20:11 04/19/2015 22:21 04/20/2015 07:31  Glucose-Capillary Latest Ref Range: 65-99 mg/dL 528 (H) 413 (H) 244 (H) 153 (H) 320 (H)   Reason for Visit: Hyperglycemia/HHNK  Diabetes history: DM 2 Outpatient Diabetes medications: Metformin 500 mg BIDT(to be changed to other PO) Current orders for Inpatient glycemic control: Lantus 20 units QHS, Novolog 0-15 units TID, Novolog 0-5 units QHS  Inpatient Diabetes Program Recommendations  Oral Agents: With an A1c >10% insulin is recommended. However due to the patient's homeless situation we may only be able to do PO's. If the desire is to place on oral medication please start them while inpatient for dosing. Due to A1c level, patient will require more than one oral medication. PO's usually drop an A1c 1-1.5%.  HgbA1C: 10.6% on 5.23/16  Note: Fasting glucose was very elevated in the 300's this am. If orals are not started, patient will need significantly more basal insulin.   Thanks,  Christena Deem RN, MSN, [facility] Inpatient Diabetes Coordinator Team Pager [phone]

## 2015-04-20 NOTE — Progress Notes (Signed)
Patient ID: Brady Church, male   DOB: December 06, 1960, 54 y.o.   MRN: [id number] Met with patient in his hospital room today and gave him an Accu chek Nano meter with 10 lancets and 10 strips. He demonstrated  and taught back how to use it. He had difficulty seeing the blood drop on his finger and the meter readout, but says he has a friend who can help him. We discussed that it is important for him to bring the meter with him to doctor's appointments so the doctor knows how his blood sugars are running at home.

## 2015-04-23 ENCOUNTER — Ambulatory Visit: Payer: Medicaid Other | Admitting: Internal Medicine

## 2015-04-23 ENCOUNTER — Encounter: Payer: Self-pay | Admitting: Internal Medicine

## 2015-04-23 ENCOUNTER — Ambulatory Visit (INDEPENDENT_AMBULATORY_CARE_PROVIDER_SITE_OTHER): Payer: Medicaid Other | Admitting: Internal Medicine

## 2015-04-23 VITALS — BP 140/83 | HR 92 | Temp 98.6°F | Ht 68.0 in | Wt 244.0 lb

## 2015-04-23 DIAGNOSIS — E1165 Type 2 diabetes mellitus with hyperglycemia: Secondary | ICD-10-CM | POA: Diagnosis not present

## 2015-04-23 DIAGNOSIS — E1136 Type 2 diabetes mellitus with diabetic cataract: Secondary | ICD-10-CM | POA: Diagnosis not present

## 2015-04-23 DIAGNOSIS — I10 Essential (primary) hypertension: Secondary | ICD-10-CM

## 2015-04-23 DIAGNOSIS — B9689 Other specified bacterial agents as the cause of diseases classified elsewhere: Secondary | ICD-10-CM

## 2015-04-23 DIAGNOSIS — L03116 Cellulitis of left lower limb: Secondary | ICD-10-CM

## 2015-04-23 NOTE — Assessment & Plan Note (Addendum)
Recently admitted to hospital with DKA with sugar in 700's and anion gap 14 with urine and serum ketones. Was on metformin 500mg  bid but stop taking it for diarrhea prior to admission  Hgba1c 7.3 1 month ago, then in the hospital it was 10.6 four days ago. Also had left leg cellulitis likely causing the hyperglycemia/dka.  Was put on Actos 15mg  daily + Amaryl 2mg  daily from discahrge. Only took it for 3 days. Today sugar is still in 400's. At home has been checking it daily and is 400's on average.  Most meds take 5 half lives to see any effect and he did not have enough time for the meds to reach steady state.   - Will continue same meds for now and have him follow up in 1 week. I expect he will require uptitration of this meds or addition of extended release metformin to his regimen. - continue monitoring BS 3 times a day - counseled about low carb diet

## 2015-04-23 NOTE — Assessment & Plan Note (Signed)
Had left leg cellulitis that was treated during last admission. Was put on doxycycline for 5 days.  Has 1 more day left of doxy. Cellulitis has resolved. Leg appear well healed on exam.

## 2015-04-23 NOTE — Assessment & Plan Note (Signed)
Filed Vitals:   04/23/15 1402  BP: 140/83  Pulse: 92  Temp: 98.6 F (37 C)    BP well controlled on hctz 25mg  daily + coreg 3.125mg  BID. Cont same regimen.

## 2015-04-23 NOTE — Progress Notes (Signed)
   Subjective:    Patient ID: Brady Church, male    DOB: [birth year], 54 y.o.   MRN: [id number]  HPI  54 yo male with hx of DM II, HLD, HTN, glaucoma causing severe vision impairment, systolic dysfunction EF 40-45%, recently admitted 04/19/15 to 04/20/2015 with hyperglycemia/DKA with BS 702 and gap 14 and ketones in urine and serum. here for hospital follow up.  Last hgba1c 1 month ago 7.3. Was started on metformin but stopped taking it due to diarrhea. After dicharge from hospital, he was put on Actos 15mg  daily + Amaryl 2mg  daily. He has only taken in for 2-3 days. Today sugar is still in 400's. At home has been checking it daily and is 400's on average.   Cellulitis has resolved. Finishing doxycycline.   Denies any complaint.  Review of Systems  Constitutional: Negative for fever, chills and fatigue.  HENT: Negative for congestion, sore throat and trouble swallowing.   Eyes: Negative for visual disturbance.       Is legally blind.  Respiratory: Negative for chest tightness and shortness of breath.   Cardiovascular: Negative for chest pain, palpitations and leg swelling.  Gastrointestinal: Negative for nausea, vomiting, diarrhea and abdominal distention.  Endocrine: Negative for polyuria.  Genitourinary: Negative for dysuria.  Musculoskeletal: Negative.  Negative for back pain.  Skin: Negative.   Neurological: Negative for dizziness, weakness and numbness.  Psychiatric/Behavioral: Negative.        Objective:   Physical Exam  Constitutional: He is oriented to person, place, and time. He appears well-developed and well-nourished. No distress.  HENT:  Head: Normocephalic and atraumatic.  Right Ear: External ear normal.  Mouth/Throat: Oropharynx is clear and moist. No oropharyngeal exudate.  Cardiovascular: Normal rate and regular rhythm.  Exam reveals no gallop and no friction rub.   No murmur heard. Pulmonary/Chest: Effort normal and breath sounds normal. No respiratory  distress. He has no wheezes. He has no rales. He exhibits no tenderness.  Abdominal: Soft. Bowel sounds are normal. He exhibits no distension. There is no tenderness.  Musculoskeletal: Normal range of motion. He exhibits no edema or tenderness.  Left shin area with previous cellulitis appear to be well healing. No discharge, erythema, tenderness, or warmth.  Neurological: He is alert and oriented to person, place, and time.  Skin: He is not diaphoretic.    Filed Vitals:   04/23/15 1402  BP: 140/83  Pulse: 92  Temp: 98.6 F (37 C)       Assessment & Plan:  See problem based a&p.

## 2015-04-23 NOTE — Patient Instructions (Signed)
Keep taking your medications as you were.   Keep checking your blood sugar 3 times a day.  Bring your meter with you and follow up in 1 week.

## 2015-04-27 ENCOUNTER — Other Ambulatory Visit: Payer: Self-pay | Admitting: Internal Medicine

## 2015-04-27 LAB — GLUCOSE, CAPILLARY: GLUCOSE-CAPILLARY: 482 mg/dL — AB (ref 65–99)

## 2015-04-27 NOTE — Progress Notes (Signed)
Case discussed with Dr. Ahmed at time of visit. We reviewed the resident's history and exam and pertinent patient test results. I agree with the assessment, diagnosis, and plan of care documented in the resident's note. 

## 2015-04-30 ENCOUNTER — Ambulatory Visit (INDEPENDENT_AMBULATORY_CARE_PROVIDER_SITE_OTHER): Payer: Medicaid Other | Admitting: Dietician

## 2015-04-30 ENCOUNTER — Encounter: Payer: Self-pay | Admitting: Internal Medicine

## 2015-04-30 ENCOUNTER — Encounter: Payer: Self-pay | Admitting: Dietician

## 2015-04-30 ENCOUNTER — Ambulatory Visit (INDEPENDENT_AMBULATORY_CARE_PROVIDER_SITE_OTHER): Payer: Medicaid Other | Admitting: Internal Medicine

## 2015-04-30 VITALS — BP 142/96 | HR 82 | Temp 97.9°F | Wt 241.0 lb

## 2015-04-30 DIAGNOSIS — I10 Essential (primary) hypertension: Secondary | ICD-10-CM | POA: Diagnosis not present

## 2015-04-30 DIAGNOSIS — E119 Type 2 diabetes mellitus without complications: Secondary | ICD-10-CM | POA: Diagnosis not present

## 2015-04-30 DIAGNOSIS — Z713 Dietary counseling and surveillance: Secondary | ICD-10-CM | POA: Diagnosis not present

## 2015-04-30 DIAGNOSIS — E1165 Type 2 diabetes mellitus with hyperglycemia: Secondary | ICD-10-CM | POA: Diagnosis not present

## 2015-04-30 DIAGNOSIS — E1136 Type 2 diabetes mellitus with diabetic cataract: Secondary | ICD-10-CM

## 2015-04-30 DIAGNOSIS — L03116 Cellulitis of left lower limb: Secondary | ICD-10-CM

## 2015-04-30 LAB — GLUCOSE, CAPILLARY: Glucose-Capillary: 415 mg/dL — ABNORMAL HIGH (ref 65–99)

## 2015-04-30 MED ORDER — PIOGLITAZONE HCL 30 MG PO TABS: 15.0000 mg | ORAL_TABLET | Freq: Every day | ORAL | Status: DC

## 2015-04-30 MED ORDER — GLIMEPIRIDE 4 MG PO TABS: 4.0000 mg | ORAL_TABLET | Freq: Every day | ORAL | Status: DC

## 2015-04-30 NOTE — Progress Notes (Signed)
Subjective:   Patient ID: Brady Church male   DOB: 19-Jul-1961 54 y.o.   MRN: [id number]  HPI: Brady Church is a 54 y.o. male w/ PMHx of HTN, chronic combined CHF, HLD, blindness, and DM type II, presents to the clinic today for a follow-up visit regarding ongoing hyperglycemia. Patient was recently discharged from the hospital on 04/20/15 for DKA and cellulitis and at that time was started on Actos 15 mg daily + Amaryl 2 mg daily instead of Metformin as he states this gave him bad diarrhea (keep in mind he only took 1 dose, according to the patient). Patient was then seen in the clinic last week on 04/23/15 at which time his CBG's were still consistently in the 300-450 range, however, no changes were made to his medications at that time as he states he had only ben taking them for about 3 days. Today, his blood sugar meter reads an average CBG of 453, lowest being 348, highest of 553. He says he has ben taking his medicines as previously prescribed and denies any adverse effects related to his medications, specifically no LE edema, weight gain, or CHF symptoms. He also denies any symptoms of hypoglycemia. He does admit to polyuria and polydipsia. He denies nausea, vomiting, diarrhea, palpitations, dizziness, or lightheadedness.   Past Medical History  Diagnosis Date  . Diabetes mellitus   . Hypertension   . Substance abuse     cocaine. Stopped in 2010  . High cholesterol   . Tobacco use disorder 10/07/2014  . Blind     blind in left eye, very limited vision right eye    Current Outpatient Prescriptions  Medication Sig Dispense Refill  . ACCU-CHEK FASTCLIX LANCETS MISC 1 each by Does not apply route 2 (two) times daily. 102 each 12  . carvedilol (COREG) 3.125 MG tablet Take 1 tablet (3.125 mg total) by mouth 2 (two) times daily with a meal. 60 tablet 3  . dorzolamide-timolol (COSOPT) 22.3-6.8 MG/ML ophthalmic solution Place 1 drop into the right eye 2 (two) times daily.  3  . DUREZOL  0.05 % EMUL Place 1 drop into the right eye 4 (four) times daily.  12  . glimepiride (AMARYL) 2 MG tablet Take 1 tablet (2 mg total) by mouth daily with breakfast. 30 tablet 11  . glucose blood (ACCU-CHEK SMARTVIEW) test strip Use as instructed 100 each 12  . hydrochlorothiazide (HYDRODIURIL) 25 MG tablet Take 1 tablet (25 mg total) by mouth daily. 30 tablet 6  . lovastatin (MEVACOR) 40 MG tablet Take 1 tablet (40 mg total) by mouth daily. 30 tablet 11  . pioglitazone (ACTOS) 15 MG tablet Take 1 tablet (15 mg total) by mouth daily. 30 tablet 11  . Potassium Chloride ER 20 MEQ TBCR Take 20 mEq by mouth daily. (Patient not taking: Reported on 03/09/2015) 30 tablet 3   No current facility-administered medications for this visit.   Review of Systems  General: Denies fever, diaphoresis, appetite change, and fatigue.  Respiratory: Denies SOB, cough, and wheezing.   Cardiovascular: Denies chest pain and palpitations.  Gastrointestinal: Denies nausea, vomiting, abdominal pain, and diarrhea Endocrine: Positive for polyuria and polydipsia.  Musculoskeletal: Denies myalgias, arthralgias, back pain, and gait problem.  Neurological: Denies dizziness, syncope, weakness, lightheadedness, and headaches.  Psychiatric/Behavioral: Denies mood changes, sleep disturbance, and agitation.   Objective:   Physical Exam: Filed Vitals:   04/30/15 0935  BP: 142/96  Pulse: 82  Temp: 97.9 F (36.6 C)  TempSrc: Oral  Weight: 241 lb (109.317 kg)  SpO2: 99%    General: Obese AA male, alert, cooperative, NAD. Blind.  HEENT: PERRL, EOMI. Moist mucus membranes Neck: Full range of motion without pain, supple, no lymphadenopathy or carotid bruits Lungs: Clear to ascultation bilaterally, normal work of respiration, no wheezes, rales, rhonchi Heart: RRR, no murmurs, gallops, or rubs Abdomen: Obese, soft, non-tender, non-distended, BS + Extremities: No cyanosis, clubbing, or edema. Cellulitis resolved on left leg.    Neurologic: Alert & oriented x3, cranial nerves II-XII intact, strength grossly intact, sensation intact to light touch   Assessment & Plan:   Please see problem based assessment and plan.

## 2015-04-30 NOTE — Assessment & Plan Note (Signed)
Lab Results  Component Value Date   HGBA1C 10.6* 04/19/2015   HGBA1C 7.3 03/18/2015   HGBA1C 7.4 03/09/2015     Assessment: Diabetes control:  Poor Comments: Patient continues to have CBG's in the 400's according to his home blood sugars. Currently taking Actos 15 mg daily + Amaryl 2 mg daily. Was taken off of Metformin because he had diarrhea. He states he only actually took one dose of this (could benefit from trying long acting metformin in the future).   Plan: Medications:  Increase Actos to 30 mg daily + Amaryl to 4 mg daily. Patient has sufficient room with CBG's to increase both at this time. He denies any hypoglycemia or symptoms that would suggest this. Also denies any side effects related to current medications (no LE edema, weight gain, or CHF symptoms).  Home glucose monitoring: Frequency:  qAM Instruction/counseling given: discussed diet Other plans: Return in 1 week. Given poor control in the 400's and poor tolerance to Metformin, will most likely require the addition of Lantus at some point, especially given that his last HbA1c was 10.6.  -Would recheck BMP at next visit as well as complete thorough cardiac exam given his h/o mild CHF on Actos w/ new dosage increase.

## 2015-04-30 NOTE — Assessment & Plan Note (Signed)
BP Readings from Last 3 Encounters:  04/30/15 142/96  04/23/15 140/83  04/20/15 148/98    Lab Results  Component Value Date   NA 139 04/20/2015   K 3.6 04/20/2015   CREATININE 0.91 04/20/2015    Assessment: Blood pressure control:  Elevated Comments: Taking HCTZ 25 mg bid + Coreg 3.125 mg bid. States Brady Church has been compliant with this. BP has been mildly elevated for his last 3 visits.   Plan: Medications:  continue current medications for now. Do not think the patient can manage another change in medication today (increasing DM medications). Given that this is a relatively new diagnosis of DM type II and patient has a known history of CHF (EF of 40-45%), may be beneficial to add an ACEI (and likely discontinue HCTZ) in the very near future for optimal BP control. Could also likely tolerate an increase in BB as well.  Other plans: Return in 1 week at which time can consider addition of ACEI

## 2015-04-30 NOTE — Patient Instructions (Signed)
Check your feet every day by feeling them. Have someone else look at them for you. Keep them dry  between your toes .  Put lotion to keep soft on heel.     Diabetes and Foot Care Diabetes may cause you to have problems because of poor blood supply (circulation) to your feet and legs. This may cause the skin on your feet to become thinner, break easier, and heal more slowly. Your skin may become dry, and the skin may peel and crack. You may also have nerve damage in your legs and feet causing decreased feeling in them. You may not notice minor injuries to your feet that could lead to infections or more serious problems. Taking care of your feet is one of the most important things you can do for yourself.   HOME CARE INSTRUCTIONS  Wear shoes at all times, even in the house. Do not go barefoot. Bare feet are easily injured.  Check your feet daily for blisters, cuts, and redness. If you cannot see the bottom of your feet, use a mirror or ask someone for help.  Wash your feet with warm water (do not use hot water) and mild soap. Then pat your feet and the areas between your toes until they are completely dry. Do not soak your feet as this can dry your skin.  Apply a moisturizing lotion or petroleum jelly (that does not contain alcohol and is unscented) to the skin on your feet and to dry, brittle toenails. Do not apply lotion between your toes.  Trim your toenails straight across. Do not dig under them or around the cuticle. File the edges of your nails with an emery board or nail file.  Do not cut corns or calluses or try to remove them with medicine.  Wear clean socks or stockings every day. Make sure they are not too tight. Do not wear knee-high stockings since they may decrease blood flow to your legs.  Wear shoes that fit properly and have enough cushioning. To break in new shoes, wear them for just a few hours a day. This prevents you from injuring your feet. Always look in your shoes  before you put them on to be sure there are no objects inside.  Do not cross your legs. This may decrease the blood flow to your feet.  If you find a minor scrape, cut, or break in the skin on your feet, keep it and the skin around it clean and dry. These areas may be cleansed with mild soap and water. Do not cleanse the area with peroxide, alcohol, or iodine.  When you remove an adhesive bandage, be sure not to damage the skin around it.  If you have a wound, look at it several times a day to make sure it is healing.  Do not use heating pads or hot water bottles. They may burn your skin. If you have lost feeling in your feet or legs, you may not know it is happening until it is too late.  Make sure your health care provider performs a complete foot exam at least annually or more often if you have foot problems. Report any cuts, sores, or bruises to your health care provider immediately. SEEK MEDICAL CARE IF:   You have an injury that is not healing.  You have cuts or breaks in the skin.  You have an ingrown nail.  You notice redness on your legs or feet.  You feel burning or tingling in your legs  or feet.  You have pain or cramps in your legs and feet.  Your legs or feet are numb.  Your feet always feel cold. SEEK IMMEDIATE MEDICAL CARE IF:   There is increasing redness, swelling, or pain in or around a wound.  There is a red line that goes up your leg.  Pus is coming from a wound.  You develop a fever or as directed by your health care provider.  You notice a bad smell coming from an ulcer or wound.

## 2015-04-30 NOTE — Patient Instructions (Signed)
1. Please make a follow up appointment in 1 week.   2. Please take all medications as previously prescribed with the following changes:  Increase Amaryl to 4 mg daily (take 2 of the 2 mg tablets you already have). I have sent a new prescription to your pharmacy.   Increase Actos to 30 mg daily (also take 2 of the 15 mg tablets you already have). I have ALSO sent a new prescription for this medicine to your pharmacy.   3. If you have worsening of your symptoms or new symptoms arise, please call the clinic ([phone]), or go to the ER immediately if symptoms are severe.

## 2015-04-30 NOTE — Assessment & Plan Note (Signed)
Resolved

## 2015-04-30 NOTE — Progress Notes (Signed)
Internal Medicine Clinic Attending  54 year old man with uncontrolled diabtetes type 2, the main issue being addressed this visit. Patient on Actos and Amaryl - dosages of both would be escalated today. Noted recent ECHO with reduced ejection fraction and diastolic grade 1 HF.  Case discussed with Dr. Yetta Barre at the time of the visit.  We reviewed the resident's history and exam and pertinent patient test results.  I agree with the assessment, diagnosis, and plan of care documented in the resident's note.

## 2015-04-30 NOTE — Progress Notes (Signed)
Diabetes Self-Management Education  Visit Type: First/Initial  Appt. Start Time: 1015 Appt. End Time:1048  04/30/2015  Mr. Brady Church, identified by name and date of birth, is a 54 y.o. male with a diagnosis of Diabetes: Type 2.        ASSESSMENT  weight  BMI.  Initial Visit Information:  Are you currently following a meal plan?: Yes What type of meal plan do you follow?: tried to eat balacned there meals a day, no snacks Are you taking your medications as prescribed?: Yes Are you checking your feet?: No   How often do you need to have someone help you when you read instructions, pamphlets, or other written materials from your doctor or pharmacy?: 4 - Often (90% blind- can see limited- 2 feet in front of him)    Psychosocial:  Self-care barriers: Impaired vision, Lack of transportation, Lack of material resources Self-management support: Doctor's office, Friends, CDE visits Patient Concerns: Nutrition/Meal planning, Monitoring Special Needs: Large print Preferred Learning Style: Auditory, No preference indicated Learning Readiness: Contemplating  Complications:   How often do you check your blood sugar?: 1-2 times/day Fasting Blood glucose range (mg/dL): >056 Postprandial Blood glucose range (mg/dL): >979 Number of hypoglycemic episodes per month: 0 Number of hyperglycemic episodes per week:  (100) Have you had a dilated eye exam in the past 12 months?: No Have you had a dental exam in the past 12 months?: No  Diet Intake:  Eats three meals a day, spaced throughout day with no snacks reported. He reports his friends are helping him to learn what foods increase his blood sugar and to eat smaller portions. He in interested in portion sizes of meat such as chicken and how to prepare foods.  Beverage(s):  (16 oz beer three days a week)  Exercise:  Exercise: ADL's, Light (walking / raking leaves) Light Exercise amount of time (min / week): 100  Individualized Plan  for Diabetes Self-Management Training:   Learning Objective:  Patient will have a greater understanding of diabetes self-management.  Patient education plan per assessed needs and concerns is to attend individual sessions for meal aplanning, monitoring, chronic and acute complications    Education Topics Reviewed with Patient Today:    Role of diet in the treatment of diabetes and the relationship between the three main macronutrients and blood glucose level, Effects of alcohol on blood glucose and safety factors with consumption of alcohol.     Taught/evaluated SMBG meter., Purpose and frequency of SMBG.   Assessed and discussed foot care and prevention of foot problems  Also, the effects smoking has on feet and circulation        PATIENTS GOALS/Plan (Developed by the patient):  Reducing Risk: do foot checks daily  Plan:   Patient Instructions  Check your feet every day by feeling them. Have someone else look at them for you. Keep them dry  between your toes .  Put lotion to keep soft on heel.     Diabetes and Foot Care Diabetes may cause you to have problems because of poor blood supply (circulation) to your feet and legs. This may cause the skin on your feet to become thinner, break easier, and heal more slowly. Your skin may become dry, and the skin may peel and crack. You may also have nerve damage in your legs and feet causing decreased feeling in them. You may not notice minor injuries to your feet that could lead to infections or more serious problems. Taking care of  your feet is one of the most important things you can do for yourself.   HOME CARE INSTRUCTIONS  Wear shoes at all times, even in the house. Do not go barefoot. Bare feet are easily injured.  Check your feet daily for blisters, cuts, and redness. If you cannot see the bottom of your feet, use a mirror or ask someone for help.  Wash your feet with warm water (do not use hot water) and mild soap. Then pat  your feet and the areas between your toes until they are completely dry. Do not soak your feet as this can dry your skin.  Apply a moisturizing lotion or petroleum jelly (that does not contain alcohol and is unscented) to the skin on your feet and to dry, brittle toenails. Do not apply lotion between your toes.  Trim your toenails straight across. Do not dig under them or around the cuticle. File the edges of your nails with an emery board or nail file.  Do not cut corns or calluses or try to remove them with medicine.  Wear clean socks or stockings every day. Make sure they are not too tight. Do not wear knee-high stockings since they may decrease blood flow to your legs.  Wear shoes that fit properly and have enough cushioning. To break in new shoes, wear them for just a few hours a day. This prevents you from injuring your feet. Always look in your shoes before you put them on to be sure there are no objects inside.  Do not cross your legs. This may decrease the blood flow to your feet.  If you find a minor scrape, cut, or break in the skin on your feet, keep it and the skin around it clean and dry. These areas may be cleansed with mild soap and water. Do not cleanse the area with peroxide, alcohol, or iodine.  When you remove an adhesive bandage, be sure not to damage the skin around it.  If you have a wound, look at it several times a day to make sure it is healing.  Do not use heating pads or hot water bottles. They may burn your skin. If you have lost feeling in your feet or legs, you may not know it is happening until it is too late.  Make sure your health care provider performs a complete foot exam at least annually or more often if you have foot problems. Report any cuts, sores, or bruises to your health care provider immediately. SEEK MEDICAL CARE IF:   You have an injury that is not healing.  You have cuts or breaks in the skin.  You have an ingrown nail.  You notice  redness on your legs or feet.  You feel burning or tingling in your legs or feet.  You have pain or cramps in your legs and feet.  Your legs or feet are numb.  Your feet always feel cold. SEEK IMMEDIATE MEDICAL CARE IF:   There is increasing redness, swelling, or pain in or around a wound.  There is a red line that goes up your leg.  Pus is coming from a wound.  You develop a fever or as directed by your health care provider.  You notice a bad smell coming from an ulcer or wound.      Expected Outcomes:  Demonstrated interest in learning. Expect positive outcomes Education material provided: AVS mailed If problems or questions, patient to contact team via:  Phone Future DSME appointment: 2  wks

## 2015-05-12 ENCOUNTER — Encounter: Payer: Medicaid Other | Admitting: Dietician

## 2015-05-12 ENCOUNTER — Encounter: Payer: Self-pay | Admitting: Internal Medicine

## 2015-05-12 ENCOUNTER — Ambulatory Visit (INDEPENDENT_AMBULATORY_CARE_PROVIDER_SITE_OTHER): Payer: Medicaid Other | Admitting: Internal Medicine

## 2015-05-12 VITALS — BP 133/79 | HR 86 | Temp 98.2°F | Ht 68.0 in | Wt 241.9 lb

## 2015-05-12 DIAGNOSIS — E1136 Type 2 diabetes mellitus with diabetic cataract: Secondary | ICD-10-CM | POA: Diagnosis not present

## 2015-05-12 DIAGNOSIS — F17210 Nicotine dependence, cigarettes, uncomplicated: Secondary | ICD-10-CM

## 2015-05-12 DIAGNOSIS — I10 Essential (primary) hypertension: Secondary | ICD-10-CM | POA: Diagnosis not present

## 2015-05-12 DIAGNOSIS — E1165 Type 2 diabetes mellitus with hyperglycemia: Secondary | ICD-10-CM | POA: Diagnosis present

## 2015-05-12 DIAGNOSIS — Z299 Encounter for prophylactic measures, unspecified: Secondary | ICD-10-CM

## 2015-05-12 DIAGNOSIS — I5042 Chronic combined systolic (congestive) and diastolic (congestive) heart failure: Secondary | ICD-10-CM | POA: Diagnosis not present

## 2015-05-12 LAB — GLUCOSE, CAPILLARY: GLUCOSE-CAPILLARY: 561 mg/dL — AB (ref 65–99)

## 2015-05-12 MED ORDER — METFORMIN HCL 500 MG PO TABS: 250.0000 mg | ORAL_TABLET | ORAL | Status: DC

## 2015-05-12 NOTE — Progress Notes (Signed)
   Subjective:    Patient ID: Brady Church, male    DOB: [birth year], 54 y.o.   MRN: [id number]  HPI Brady Church is a 54 yo male with PMHx of T2DM, HTN, and chronic CHF who presents for follow up for diabetes. Please see problem based assessment and plan for more information.  Review of Systems General: Denies fever, chills, fatigue Respiratory: Denies SOB, cough  Cardiovascular: Denies chest pain and palpitations.  Gastrointestinal: Denies diarrhea, constipation, blood in stool  Endocrine: Admits to polyuria, and polydipsia. Skin: Denies pallor, rash and wounds.  Neurological: Denies dizziness, headaches, weakness, lightheadedness  Past Medical History  Diagnosis Date  . Diabetes mellitus   . Hypertension   . Substance abuse     cocaine. Stopped in 2010  . High cholesterol   . Tobacco use disorder 10/07/2014  . Blind     blind in left eye, very limited vision right eye    Outpatient Encounter Prescriptions as of 05/12/2015  Medication Sig Note  . ACCU-CHEK FASTCLIX LANCETS MISC 1 each by Does not apply route 2 (two) times daily.   . carvedilol (COREG) 3.125 MG tablet Take 1 tablet (3.125 mg total) by mouth 2 (two) times daily with a meal.   . dorzolamide-timolol (COSOPT) 22.3-6.8 MG/ML ophthalmic solution Place 1 drop into the right eye 2 (two) times daily. 04/19/2015: .   Marland Kitchen DUREZOL 0.05 % EMUL Place 1 drop into the right eye 4 (four) times daily. 04/19/2015: .   . glimepiride (AMARYL) 4 MG tablet Take 1 tablet (4 mg total) by mouth daily with breakfast.   . glucose blood (ACCU-CHEK SMARTVIEW) test strip Use as instructed   . hydrochlorothiazide (HYDRODIURIL) 25 MG tablet Take 1 tablet (25 mg total) by mouth daily.   Marland Kitchen lovastatin (MEVACOR) 40 MG tablet Take 1 tablet (40 mg total) by mouth daily.   . metFORMIN (GLUCOPHAGE) 500 MG tablet Take 0.5 tablets (250 mg total) by mouth daily after supper.   . pioglitazone (ACTOS) 30 MG tablet Take 0.5 tablets (15 mg total) by mouth  daily.   . Potassium Chloride ER 20 MEQ TBCR Take 20 mEq by mouth daily. (Patient not taking: Reported on 03/09/2015)    No facility-administered encounter medications on file as of 05/12/2015.      Objective:   Physical Exam Filed Vitals:   05/12/15 1329  BP: 133/79  Pulse: 86  Temp: 98.2 F (36.8 C)  TempSrc: Oral  Height: 5\' 8"  (1.727 m)  Weight: 241 lb 14.4 oz (109.725 kg)  SpO2: 97%   General: Vital signs reviewed.  Patient is well-developed and well-nourished, in no acute distress and cooperative with exam.  Cardiovascular: RRR, S1 normal, S2 normal Pulmonary/Chest: Clear to auscultation bilaterally, no wheezes, rales, or rhonchi. Abdominal: Soft, non-tender, non-distended, BS + Extremities: Dry skin. No lower extremity edema bilaterally, pulses symmetric and intact bilaterally. No cyanosis or clubbing. Skin: Warm, dry and intact. No rashes or erythema. Psychiatric: Normal mood and affect. speech and behavior is normal. Cognition and memory are normal.     Assessment & Plan:   Please see problem based assessment and plan.

## 2015-05-12 NOTE — Assessment & Plan Note (Signed)
GI referral for colonscopy Refused PNA vaccine Counseled on tobacco cessation (cut back from 1 ppd to 5 cigarettes per day)

## 2015-05-12 NOTE — Assessment & Plan Note (Addendum)
Lab Results  Component Value Date   HGBA1C 10.6* 04/19/2015   HGBA1C 7.3 03/18/2015   HGBA1C 7.4 03/09/2015     Assessment: Diabetes control:  Uncontrolled Progress toward A1C goal:   Stagnant Comments: Patient has a relatively new diagnosis of T2DM. HgbA1c was 5.5 09/2014, then 7.3 02/2015, and then 10.6 03/2015 during recent hospitalization for DKA. Patient had been started as outpatient on Metformin 500 mg BID, however he got diarrhea after one pill and stopped taking it. Patient is currently on Actos 30 mg daily and Amaryl 4 mg daily. At recent visits, glucose has been above 300. Patient is not able to take Lantus due to no [facility]. Today, glucose 561. There does not seem to be any improvement in glucose. Patient seems put off from insulin for several reasons- lack of refrigerator, concern of being able to do shots himself, etc. Agreeable to trying metformin again.   Plan: Medications:  Restart Metformin as 250 mg during or after dinner each night for one week, then increased to 500 mg qsupper for one week, then 500 mg BID. Continue actos 30 mg daily and amaryl 4 mg daily Instruction/counseling given: Counseled on diet Educational resources provided:  Met with Butch Penny Other plans: Return in two weeks. If glucose is not improving, will highly need to consider Levemir. Will not check urine microalbumin/creatinine ratio today during acute hyperglycemia as this can falsely increase results. Consider adding ACEI next visit.

## 2015-05-12 NOTE — Assessment & Plan Note (Addendum)
Echo on 12/2014 revealed EF of 40-45% and grade 1 diastolic dysfunction. Patient is on actos, but has been tolerating the medication well. Patient denies shortness of breath, DOE, weight gain, lower extremity edema.   Plan: Continue carvedilol 3.125 mg BID, consider increase and adding ACEI at next visit

## 2015-05-12 NOTE — Patient Instructions (Signed)
General Instructions:   Thank you for bringing your medicines today. This helps [place] keep you safe from mistakes.   TAKE METFORMIN 250 MG (HALF A PILL) DURING DINNER OR AFTER DINNER EVERY NIGHT. THIS MAY CAUSE DIARRHEA OR GAS, BUT THIS WILL GET BETTER AS YOUR BODY GETS USED TO IT. CONTINUE TO TAKE YOUR OTHER MEDICATIONS AS PRESCRIBED.  RETURN IN 2 WEEKS FOR FOLLOW UP.    Diabetes Mellitus and Food It is important for you to manage your blood sugar (glucose) level. Your blood glucose level can be greatly affected by what you eat. Eating healthier foods in the appropriate amounts throughout the day at about the same time each day will help you control your blood glucose level. It can also help slow or prevent worsening of your diabetes mellitus. Healthy eating may even help you improve the level of your blood pressure and reach or maintain a healthy weight.  HOW CAN FOOD AFFECT ME? Carbohydrates Carbohydrates affect your blood glucose level more than any other type of food. Your dietitian will help you determine how many carbohydrates to eat at each meal and teach you how to count carbohydrates. Counting carbohydrates is important to keep your blood glucose at a healthy level, especially if you are using insulin or taking certain medicines for diabetes mellitus. Alcohol Alcohol can cause sudden decreases in blood glucose (hypoglycemia), especially if you use insulin or take certain medicines for diabetes mellitus. Hypoglycemia can be a life-threatening condition. Symptoms of hypoglycemia (sleepiness, dizziness, and disorientation) are similar to symptoms of having too much alcohol.  If your health care provider has given you approval to drink alcohol, do so in moderation and use the following guidelines:  Women should not have more than one drink per day, and men should not have more than two drinks per day. One drink is equal to:  12 oz of beer.  5 oz of wine.  1 oz of hard liquor.  Do not  drink on an empty stomach.  Keep yourself hydrated. Have water, diet soda, or unsweetened iced tea.  Regular soda, juice, and other mixers might contain a lot of carbohydrates and should be counted. WHAT FOODS ARE NOT RECOMMENDED? As you make food choices, it is important to remember that all foods are not the same. Some foods have fewer nutrients per serving than other foods, even though they might have the same number of calories or carbohydrates. It is difficult to get your body what it needs when you eat foods with fewer nutrients. Examples of foods that you should avoid that are high in calories and carbohydrates but low in nutrients include:  Trans fats (most processed foods list trans fats on the Nutrition Facts label).  Regular soda.  Juice.  Candy.  Sweets, such as cake, pie, doughnuts, and cookies.  Fried foods. WHAT FOODS CAN I EAT? Have nutrient-rich foods, which will nourish your body and keep you healthy. The food you should eat also will depend on several factors, including:  The calories you need.  The medicines you take.  Your weight.  Your blood glucose level.  Your blood pressure level.  Your cholesterol level. You also should eat a variety of foods, including:  Protein, such as meat, poultry, fish, tofu, nuts, and seeds (lean animal proteins are best).  Fruits.  Vegetables.  Dairy products, such as milk, cheese, and yogurt (low fat is best).  Breads, grains, pasta, cereal, rice, and beans.  Fats such as olive oil, trans fat-free margarine, canola  oil, avocado, and olives. DOES EVERYONE WITH DIABETES MELLITUS HAVE THE SAME MEAL PLAN? Because every person with diabetes mellitus is different, there is not one meal plan that works for everyone. It is very important that you meet with a dietitian who will help you create a meal plan that is just right for you. Document Released: 08/10/2005 Document Revised: 11/18/2013 Document Reviewed:  10/10/2013 [facility] Patient Information 2015 [place], [place]. This information is not intended to replace advice given to you by your health care provider. Make sure you discuss any questions you have with your health care provider.

## 2015-05-12 NOTE — Assessment & Plan Note (Addendum)
BP Readings from Last 3 Encounters:  05/12/15 133/79  04/30/15 142/96  04/23/15 140/83    Lab Results  Component Value Date   NA 139 04/20/2015   K 3.6 04/20/2015   CREATININE 0.91 04/20/2015    Assessment: Blood pressure control:   At goal Progress toward BP goal:    Improved  Comments: Patient has been compliant with HCTZ 25 mg BID and coreg 3.125 mg BID.   Plan: Medications: Continue current medications Other plans: Patient could likely tolerate a low dose ACEI or increase in beta blocker at next visit. Will not change today given changes with diabetes medications.

## 2015-05-14 NOTE — Progress Notes (Signed)
Internal Medicine Clinic Attending  Case discussed with Dr. Richardson at the time of the visit.  We reviewed the resident's history and exam and pertinent patient test results.  I agree with the assessment, diagnosis, and plan of care documented in the resident's note. 

## 2015-05-24 ENCOUNTER — Encounter (HOSPITAL_COMMUNITY): Payer: Self-pay

## 2015-05-24 ENCOUNTER — Emergency Department (HOSPITAL_COMMUNITY)
Admission: EM | Admit: 2015-05-24 | Discharge: 2015-05-24 | Disposition: A | Discharge status: Home / Self Care | Payer: Medicaid Other | Attending: Emergency Medicine | Admitting: Emergency Medicine

## 2015-05-24 DIAGNOSIS — Z79899 Other long term (current) drug therapy: Secondary | ICD-10-CM | POA: Diagnosis not present

## 2015-05-24 DIAGNOSIS — I10 Essential (primary) hypertension: Secondary | ICD-10-CM

## 2015-05-24 DIAGNOSIS — K0381 Cracked tooth: Secondary | ICD-10-CM | POA: Diagnosis not present

## 2015-05-24 DIAGNOSIS — K047 Periapical abscess without sinus: Secondary | ICD-10-CM | POA: Insufficient documentation

## 2015-05-24 DIAGNOSIS — Z9841 Cataract extraction status, right eye: Secondary | ICD-10-CM | POA: Diagnosis not present

## 2015-05-24 DIAGNOSIS — K029 Dental caries, unspecified: Secondary | ICD-10-CM | POA: Insufficient documentation

## 2015-05-24 DIAGNOSIS — E1165 Type 2 diabetes mellitus with hyperglycemia: Secondary | ICD-10-CM | POA: Diagnosis not present

## 2015-05-24 DIAGNOSIS — H5412 Blindness, left eye, low vision right eye: Secondary | ICD-10-CM | POA: Insufficient documentation

## 2015-05-24 DIAGNOSIS — R42 Dizziness and giddiness: Secondary | ICD-10-CM | POA: Diagnosis present

## 2015-05-24 DIAGNOSIS — E780 Pure hypercholesterolemia: Secondary | ICD-10-CM | POA: Diagnosis not present

## 2015-05-24 DIAGNOSIS — R739 Hyperglycemia, unspecified: Secondary | ICD-10-CM

## 2015-05-24 DIAGNOSIS — Z720 Tobacco use: Secondary | ICD-10-CM | POA: Diagnosis not present

## 2015-05-24 LAB — BASIC METABOLIC PANEL
Anion gap: 14 (ref 5–15)
BUN: 7 mg/dL (ref 6–20)
CALCIUM: 9.2 mg/dL (ref 8.9–10.3)
CO2: 28 mmol/L (ref 22–32)
CREATININE: 0.84 mg/dL (ref 0.61–1.24)
Chloride: 91 mmol/L — ABNORMAL LOW (ref 101–111)
GFR calc Af Amer: >60 mL/min (ref >60)
Glucose, Bld: 277 mg/dL — ABNORMAL HIGH (ref 65–99)
Potassium: 3.6 mmol/L (ref 3.5–5.1)
SODIUM: 133 mmol/L — AB (ref 135–145)

## 2015-05-24 LAB — CBC WITH DIFFERENTIAL/PLATELET
BASOS ABS: 0 10*3/uL (ref 0.0–0.1)
BASOS PCT: 0 % (ref 0–1)
Eosinophils Absolute: 0.1 10*3/uL (ref 0.0–0.7)
Eosinophils Relative: 1 % (ref 0–5)
HEMATOCRIT: 40.2 % (ref 39.0–52.0)
HEMOGLOBIN: 14.6 g/dL (ref 13.0–17.0)
LYMPHS PCT: 28 % (ref 12–46)
Lymphs Abs: 2.4 10*3/uL (ref 0.7–4.0)
MCH: 32.1 pg (ref 26.0–34.0)
MCHC: 36.3 g/dL — AB (ref 30.0–36.0)
MCV: 88.4 fL (ref 78.0–100.0)
Monocytes Absolute: 0.6 10*3/uL (ref 0.1–1.0)
Monocytes Relative: 7 % (ref 3–12)
NEUTROS ABS: 5.4 10*3/uL (ref 1.7–7.7)
Neutrophils Relative %: 64 % (ref 43–77)
PLATELETS: 206 10*3/uL (ref 150–400)
RBC: 4.55 MIL/uL (ref 4.22–5.81)
RDW: 13.8 % (ref 11.5–15.5)
WBC: 8.5 10*3/uL (ref 4.0–10.5)

## 2015-05-24 LAB — I-STAT TROPONIN, ED: TROPONIN I, POC: 0.01 ng/mL (ref 0.00–0.08)

## 2015-05-24 LAB — I-STAT CHEM 8, ED
BUN: 8 mg/dL (ref 6–20)
Calcium, Ion: 1.11 mmol/L — ABNORMAL LOW (ref 1.12–1.23)
Chloride: 94 mmol/L — ABNORMAL LOW (ref 101–111)
Creatinine, Ser: 0.8 mg/dL (ref 0.61–1.24)
GLUCOSE: 348 mg/dL — AB (ref 65–99)
HCT: 47 % (ref 39.0–52.0)
HEMOGLOBIN: 16 g/dL (ref 13.0–17.0)
POTASSIUM: 3.3 mmol/L — AB (ref 3.5–5.1)
Sodium: 134 mmol/L — ABNORMAL LOW (ref 135–145)
TCO2: 25 mmol/L (ref 0–100)

## 2015-05-24 LAB — CBG MONITORING, ED: GLUCOSE-CAPILLARY: 233 mg/dL — AB (ref 65–99)

## 2015-05-24 MED ORDER — PENICILLIN V POTASSIUM 250 MG PO TABS
500.0000 mg | ORAL_TABLET | Freq: Once | ORAL | Status: AC
  Administered 2015-05-24: 500 mg via ORAL
  Filled 2015-05-24: qty 2

## 2015-05-24 MED ORDER — SODIUM CHLORIDE 0.9 % IV BOLUS (SEPSIS)
1000.0000 mL | Freq: Once | INTRAVENOUS | Status: AC
Start: 2015-05-24 — End: 2015-05-24
  Administered 2015-05-24: 1000 mL via INTRAVENOUS

## 2015-05-24 MED ORDER — PENICILLIN V POTASSIUM 500 MG PO TABS: 500.0000 mg | ORAL_TABLET | Freq: Four times a day (QID) | ORAL | Status: AC

## 2015-05-24 MED ORDER — MORPHINE SULFATE 4 MG/ML IJ SOLN
4.0000 mg | Freq: Once | INTRAMUSCULAR | Status: AC
  Administered 2015-05-24: 4 mg via INTRAVENOUS
  Filled 2015-05-24: qty 1

## 2015-05-24 MED ORDER — OXYCODONE-ACETAMINOPHEN 5-325 MG PO TABS: 1.0000 | ORAL_TABLET | ORAL | Status: DC | PRN

## 2015-05-24 NOTE — ED Provider Notes (Signed)
CSN: [id number]     Arrival date & time 05/24/15  0957 History   First MD Initiated Contact with Patient 05/24/15 [phone]     Chief Complaint  Patient presents with  . Dizziness     (Consider location/radiation/quality/duration/timing/severity/associated sxs/prior Treatment) The history is provided by the patient.   Pt with hx HTN, DM with left upper dental pain and facial swelling that began yesterday morning.  The pain is constant, aching, severe.  Has had lightheadedness since the pain began.  Two episodes of sharp chest pain yesterday only, lasting about 3 minutes, both times while smoking.  Denies fevers, chills, SOB, cough, sore throat, difficulty swallowing or breathing.  Took ibuprofen last night without improvement.    Past Medical History  Diagnosis Date  . Diabetes mellitus   . Hypertension   . Substance abuse     cocaine. Stopped in 2010  . High cholesterol   . Tobacco use disorder 10/07/2014  . Blind     blind in left eye, very limited vision right eye    Past Surgical History  Procedure Laterality Date  . Cataract extraction  01/08/2012    Right eye  . Eye surgery      x 4 total    Family History  Problem Relation Age of Onset  . Coronary artery disease Mother 92    died of MI  . Cancer Father 51    Some GI cancer. Not sure if it was Colon Cancer or not.  . Hypertension Father   . Diabetes Maternal Grandmother    History  Substance Use Topics  . Smoking status: Current Every Day Smoker -- 0.20 packs/day    Types: Cigarettes    Last Attempt to Quit: 01/16/2006  . Smokeless tobacco: Not on file     Comment:  smokes about 5 a day.  . Alcohol Use: 0.0 oz/week    0 Standard drinks or equivalent per week     Comment: Beer.    Review of Systems  All other systems reviewed and are negative.     Allergies  Review of patient's allergies indicates no known allergies.  Home Medications   Prior to Admission medications   Medication Sig Start Date End  Date Taking? Authorizing Provider  ACCU-CHEK FASTCLIX LANCETS MISC 1 each by Does not apply route 2 (two) times daily. 04/20/15  Yes Adrian Blackwater Moding, MD  carvedilol (COREG) 3.125 MG tablet Take 1 tablet (3.125 mg total) by mouth 2 (two) times daily with a meal. 01/07/15  Yes Alexa Dulcy Fanny, MD  dorzolamide-timolol (COSOPT) 22.3-6.8 MG/ML ophthalmic solution Place 1 drop into the right eye 2 (two) times daily. 04/05/15  Yes Historical Provider, MD  DUREZOL 0.05 % EMUL Place 1 drop into the right eye 4 (four) times daily. 03/26/15  Yes Historical Provider, MD  glimepiride (AMARYL) 4 MG tablet Take 1 tablet (4 mg total) by mouth daily with breakfast. 04/30/15  Yes Courtney Paris, MD  glucose blood (ACCU-CHEK SMARTVIEW) test strip Use as instructed 04/20/15  Yes Adrian Blackwater Moding, MD  hydrochlorothiazide (HYDRODIURIL) 25 MG tablet Take 1 tablet (25 mg total) by mouth daily. 01/07/15  Yes Alexa Dulcy Fanny, MD  lovastatin (MEVACOR) 40 MG tablet Take 1 tablet (40 mg total) by mouth daily. 01/08/15 01/08/16 Yes Alexa Dulcy Fanny, MD  metFORMIN (GLUCOPHAGE) 500 MG tablet Take 0.5 tablets (250 mg total) by mouth daily after supper. 05/12/15  Yes Alexa Dulcy Fanny, MD  pioglitazone (ACTOS) 30 MG tablet Take  0.5 tablets (15 mg total) by mouth daily. 04/30/15  Yes Courtney Paris, MD  Potassium Chloride ER 20 MEQ TBCR Take 20 mEq by mouth daily. Patient not taking: Reported on 03/09/2015 11/10/14   Alexa Dulcy Fanny, MD   BP 186/124 mmHg  Pulse 75  Temp(Src) 98.2 F (36.8 C) (Oral)  Resp 18  Ht 5\' 8"  (1.727 m)  Wt 249 lb (112.946 kg)  BMI 37.87 kg/m2  SpO2 100% Physical Exam  Constitutional: He appears well-developed and well-nourished. No distress.  HENT:  Head: Normocephalic and atraumatic.  Mouth/Throat: Uvula is midline and oropharynx is clear and moist. Mucous membranes are not dry. No uvula swelling. No oropharyngeal exudate, posterior oropharyngeal edema, posterior oropharyngeal erythema or tonsillar  abscesses.  Left upper 2nd bicuspid and 1st molar with decay and remotely fractured teeth to the gumline.  Gingiva is erythematous, edematous, tender to palpation.  Associated area of facial swelling and tenderness over left maxilla.   Neck: Normal range of motion. Neck supple.  Cardiovascular: Normal rate and regular rhythm.   Pulmonary/Chest: Effort normal and breath sounds normal. No stridor. No respiratory distress. He has no wheezes. He has no rales.  Abdominal: Soft. He exhibits no distension and no mass. There is no tenderness. There is no rebound and no guarding.  Lymphadenopathy:    He has no cervical adenopathy.  Neurological: He is alert. He exhibits normal muscle tone.  Skin: He is not diaphoretic.  Nursing note and vitals reviewed.   ED Course  Procedures (including critical care time) Labs Review Labs Reviewed  CBC WITH DIFFERENTIAL/PLATELET - Abnormal; Notable for the following:    MCHC 36.3 (*)    All other components within normal limits  BASIC METABOLIC PANEL - Abnormal; Notable for the following:    Sodium 133 (*)    Chloride 91 (*)    Glucose, Bld 277 (*)    All other components within normal limits  CBG MONITORING, ED - Abnormal; Notable for the following:    Glucose-Capillary 233 (*)    All other components within normal limits  I-STAT CHEM 8, ED - Abnormal; Notable for the following:    Sodium 134 (*)    Potassium 3.3 (*)    Chloride 94 (*)    Glucose, Bld 348 (*)    Calcium, Ion 1.11 (*)    All other components within normal limits  I-STAT TROPOININ, ED  CBG MONITORING, ED    Imaging Review No results found.   EKG Interpretation None      MDM   Final diagnoses:  Dental abscess  Hyperglycemia  Essential hypertension    Afebrile nontoxic patient with hx DM, HTN with left upper dental abscess.  Hyperglycemic at 348, improved with IVF.  No DKA.  Also HTN, improved with pain medication.  Pt given IVF, pain medication, antibiotics.  No airway  concerns.  Doubt Ludwig's angina.  D/C home with pain medication, penicillin, oral surgery/dental follow up.  Discussed result, findings, treatment, and follow up  with patient.  Pt given return precautions.  Pt verbalizes understanding and agrees with plan.        Trixie Dredge, PA-C 05/24/15 1455  Nelva Nay, MD 05/25/15 [phone]

## 2015-05-24 NOTE — ED Notes (Signed)
Pt reports left upper tooth pain that has been present since yesterday morning.  Pt also reports dizziness that began at the same time.  Pt denies any LOC, n/v/d or chest pain but reports some mild generalized weakness.

## 2015-05-24 NOTE — Discharge Instructions (Signed)
Read the information below.  Use the prescribed medication as directed.  Please discuss all new medications with your pharmacist.  Do not take additional tylenol while taking the prescribed pain medication to avoid overdose.  You may return to the Emergency Department at any time for worsening condition or any new symptoms that concern you.  Please call the dentist listed above within 48 hours to schedule a close follow up appointment.  If you develop fevers, swelling in your face, difficulty swallowing or breathing, return to the ER immediately for a recheck.   ° ° °Dental Abscess °A dental abscess is a collection of infected fluid (pus) from a bacterial infection in the inner part of the tooth (pulp). It usually occurs at the end of the tooth's root.  °CAUSES  °· Severe tooth decay. °· Trauma to the tooth that allows bacteria to enter into the pulp, such as a broken or chipped tooth. °SYMPTOMS  °· Severe pain in and around the infected tooth. °· Swelling and redness around the abscessed tooth or in the mouth or face. °· Tenderness. °· Pus drainage. °· Bad breath. °· Bitter taste in the mouth. °· Difficulty swallowing. °· Difficulty opening the mouth. °· Nausea. °· Vomiting. °· Chills. °· Swollen neck glands. °DIAGNOSIS  °· A medical and dental history will be taken. °· An examination will be performed by tapping on the abscessed tooth. °· X-rays may be taken of the tooth to identify the abscess. °TREATMENT °The goal of treatment is to eliminate the infection. You may be prescribed antibiotic medicine to stop the infection from spreading. A root canal may be performed to save the tooth. If the tooth cannot be saved, it may be pulled (extracted) and the abscess may be drained.  °HOME CARE INSTRUCTIONS °· Only take over-the-counter or prescription medicines for pain, fever, or discomfort as directed by your caregiver. °· Rinse your mouth (gargle) often with salt water (¼ tsp salt in 8 oz [250 ml] of warm water) to  relieve pain or swelling. °· Do not drive after taking pain medicine (narcotics). °· Do not apply heat to the outside of your face. °· Return to your dentist for further treatment as directed. °SEEK MEDICAL CARE IF: °· Your pain is not helped by medicine. °· Your pain is getting worse instead of better. °SEEK IMMEDIATE MEDICAL CARE IF: °· You have a fever or persistent symptoms for more than 2-3 days. °· You have a fever and your symptoms suddenly get worse. °· You have chills or a very bad headache. °· You have problems breathing or swallowing. °· You have trouble opening your mouth. °· You have swelling in the neck or around the eye. °Document Released: 11/13/2005 Document Revised: 08/07/2012 Document Reviewed: 02/21/2011 °ExitCare® Patient Information ©2015 [employer]. This information is not intended to replace advice given to you by your health care provider. Make sure you discuss any questions you have with your health care provider. ° ° °Emergency Department Resource Guide °1) Find a Doctor and Pay Out of Pocket °Although you won't have to find out who is covered by your insurance plan, it is a good idea to ask around and get recommendations. You will then need to call the office and see if the doctor you have chosen will accept you as a new patient and what types of options they offer for patients who are self-pay. Some doctors offer discounts or will set up payment plans for their patients who do not have insurance, but you   will need to ask so you aren't surprised when you get to your appointment. ° °2) Contact Your Local Health Department °Not all health departments have doctors that can see patients for sick visits, but many do, so it is worth a call to see if yours does. If you don't know where your local health department is, you can check in your phone book. The CDC also has a tool to help you locate your state's health department, and many state websites also have listings of all of their local  health departments. ° °3) Find a Walk-in Clinic °If your illness is not likely to be very severe or complicated, you may want to try a walk in clinic. These are popping up all over the country in pharmacies, drugstores, and shopping centers. They're usually staffed by nurse practitioners or physician assistants that have been trained to treat common illnesses and complaints. They're usually fairly quick and inexpensive. However, if you have serious medical issues or chronic medical problems, these are probably not your best option. ° °No Primary Care Doctor: °- Call [facility] at  832-8000 - they can help you locate a primary care doctor that  accepts your insurance, provides certain services, etc. °- Physician Referral Service- 1-800-533-3463 ° °Chronic Pain Problems: °Organization         Address  Phone   Notes  °[facility]  (336) 297-2271 Patients need to be referred by their primary care doctor.  ° °Medication Assistance: °Organization         Address  Phone   Notes  °[place] [facility] [address] °[place], [place] [address] (336) 641-8030 --Must be a resident of [place] °-- Must have NO insurance coverage whatsoever (no Medicaid/ Medicare, etc.) °-- The pt. MUST have a primary care doctor that directs their care regularly and follows them in the community °  °[facility]  (866) 331-1348   °[employer]  (888) 892-1162   ° °Agencies that provide inexpensive medical care: °Organization         Address  Phone   Notes  °[facility]  (336) 832-8035   °[facility]    (336) 832-7272   °[facility] [address] °[place], [place] [address] (336) 832-4777   °[facility] [address], °[place] (336) 271-4999   °[facility]    (336) 373-0678   °[facility]    (336) 272-1050   °[facility] ° [address], [place] Phone:   (336) 832-4444, Fax:  (336) 832-4440 Hours of Operation:  9 am - 6 pm, M-F.  Also accepts Medicaid/Medicare and self-pay.  °[facility] ° [address], [place] Phone: (336) 832-3150, Fax: (336) 832-3151. Hours of Operation:  8:30 am - 5:30 pm, M-F.  Also accepts Medicaid and self-pay.  °[facility] [place] [address], [place] Phone: (336) 878-6027   °[facility] [address], [place],  (336)723-1848, Ext. 123 Mondays & Thursdays: 7-9 AM.  First 15 patients are seen on a first come, first serve basis. °  ° °Medicaid-accepting [place] Providers: ° °Organization         Address  Phone   Notes  °[facility] [address],  (336) 641-2100 Also accepts self-pay patients.  °[facility] [address],   [place] ° (336) 856-9996   °[facility] [address], [place] (336) 288-8857   °[facility] [address] [place] [address], [place] (336) 299-7000   °Veita Bland [address], [place]  ° (336) 373-1557 Only accepts  Access Medicaid patients after they have their name applied to their card.  ° °Self-Pay (no insurance) in [place]: ° °Organization         Address  Phone   Notes  °Sickle Cell Patients, [facility] [address], [place] (336) 832-1970   °[facility] [address], [place] (336) 832-4400   °[facility] ° [address], [place] (336) 992-4800   °[facility]/Dr. Osei-Bonsu ° [address] [place] [address], [place] or [address], [place] (336) 841-8500 Phone number for both [place] and [place] locations is the same.  °[facility] [address], [place] (336) 299-0000   °[facility] [address] [place] [address], [place] or [address] (336)  852-7530 °(336) 878-2260   °[facility] [address], [place] (336) 350-1642, phone; (336) 294-5005, fax Sees patients 1st and 3rd Saturday of every month.  Must not qualify for public or private insurance (i.e. Medicaid, Medicare, [place] Health Choice, Veterans' Benefits) • Household income should be no more than 200% of the poverty level •The clinic cannot treat you if you are pregnant or think you are pregnant • Sexually transmitted diseases are not treated at the clinic.  ° ° °Dental Care: °Organization         Address  Phone  Notes  °[facility] [address], [place] (336) 641-6152 Accepts children up to age 21 who are enrolled in Medicaid or [place] Health Choice; pregnant women with a Medicaid card; and children who have applied for Medicaid or [place] Health Choice, but were declined, whose parents can pay a reduced fee at time of service.  °[facility] [place]  [address], [place] (336) 641-7733 Accepts children up to age 21 who are enrolled in Medicaid or [place] Health Choice; pregnant women with a Medicaid card; and children who have applied for Medicaid or [place] Health Choice, but were declined, whose parents can pay a reduced fee at time of service.  °[facility] ° [address], [place] (336) 641-4533 Patients are seen by appointment only. Walk-ins are not accepted. [facility] will see patients 18 years of age and older. °Monday - Tuesday (8am-5pm) °Most Wednesdays (8:30-5pm) °$30 per visit, cash only  °[facility] ° [address], [place] (336) 641-4533 Patients are seen by appointment only. Walk-ins are not accepted. [facility] will see patients 18 years of age and older. °One Wednesday Evening (Monthly: Volunteer Based).  $30 per visit, cash only  °[facility]  (919) 537-3737 for adults;  Children under age 4, call Graduate Pediatric Dentistry at (919) [phone]. Children aged 4-14, please call (919) [phone] to request a pediatric application. ° Dental services are provided in all areas of dental care including fillings, crowns and bridges, complete and partial dentures, implants, gum treatment, root canals, and extractions. Preventive care is also provided. Treatment is provided to both adults and children. °Patients are selected via a lottery and   there is often a waiting list. °  °[facility] [address], °[place] ° (336) 763-8833 [email or web address] °  °[facility] [address], [place], [place] (336)723-1848, Ext. 123 Second and Fourth Thursday of each month, opens at 6:30 AM; Clinic ends at 9 AM.  Patients are seen on a first-come first-served basis, and a limited number are seen during each clinic.  ° °[facility] ° [address], [place], [place] (336) 723-7904   Eligibility Requirements °You must have lived in [place], [place], or [place] counties for at least the last three months. °  You cannot be eligible for state or federal sponsored healthcare insurance, including Veterans Administration, Medicaid, or Medicare. °  You generally cannot be eligible for healthcare insurance through your employer.  °  How to apply: °Eligibility screenings are held every Tuesday and Wednesday afternoon from 1:00 pm until 4:00 pm. You do not need an appointment for the interview!  °[facility] [address], [place], [place] 336-631-2330   °[facility]  336-342-8273   °[facility]  336-703-3100   °[facility]  336-570-6415   ° °Behavioral Health Resources in the Community: °Intensive Outpatient Programs °Organization         Address  Phone  Notes  °[facility] [address], [place], [place] 336-878-6098   °[facility] [address], [place], [place] 336-832-9800   °[facility] [address] [place], [place], [place] ° 336-882-2125   °[facility] [address],  °[place], [place] 1-800-853-5163 or 336-641-4981   °Substance Abuse Resources °Organization         Address  Phone  Notes  °[facility]  336-882-2125   °[facility]  336-784-9470   °[facility]  336-285-9073   °[facility]  336-845-3988   °Residential & Outpatient Substance Abuse Program  1-800-659-3381   °Psychological Services °Organization         Address  Phone  Notes  °[facility]  336- 832-9600   °[facility]  336- 378-7881   °[facility] [address], [place] 1-800-853-5163 or 336-641-4981   ° °Mobile Crisis Teams °Organization         Address  Phone  Notes  °[facility], Mobile Crisis Care Unit  1-877-626-1772   °Assertive °[facility] ° [address] [place], [place] 336-834-9664   °Sharon DeEsch [address] °[place] 336-554-5454   ° °Self-Help/Support Groups °Organization         Address  Phone             Notes  °[facility] - variety of support groups  336- 373-1402 Call for more information  °Narcotics Anonymous (NA), [facility] [address], °[place]  2 meetings at this location  ° °Residential Treatment Programs °Organization         Address  Phone  Notes  °[facility] [address],    °[place]  1-866-801-8205   °[facility] ° [address], [place], [place] 704-293-8524   °[facility] [address], [place] 336-845-3988 Admissions: 8am-3pm M-F  °[facility] [address]    °[place], [place] 336-841-1104   °[facility] [address], [place], [place] 336-379-7146   °  [facility] [address]  °[place], [place] 336-285-9073   °[facility] - Intensive  Outpatient [address], [place], [place] 336-852-3033   °[facility] [address]  °[place], [place] 1-877-615-2722 or 336-784-9470   °[facility] ([facility] [address] [place], [place] 336-227-7417 Accepts Medicaid  °[facility] [address]  °[place] 1-800-659-3381 Substance Abuse/Addiction Treatment  ° °[place] Behavioral Health Resources °Organization         Address  Phone  Notes  °[facility]  (888) 581-9988   °Julie Brannon, PhD [address] [place], [place]   (336) 349-5553 or (336) 951-0000   °[facility]   [address] °[place], [place] (336) 349-4454   °[facility] [address], [place], [place] (336) 342-8316 Insurance/Medicaid/sponsorship through [facility]  °[facility] [address]                                    [place], [place] (336) 342-8316 Therapy/tele-psych/case  °[facility] [address]  ° [place], [place] (336) 349-2233    °Dr. Arfeen  (336) 349-4544   °[facility] [place]  [employer] [place] [facility] 1) [address],  °2) [address], [place] °3)  [address], [place] (336) 349-3220 °(336) 342-7768 ° °(336) 342-8140   °[place] [facility] (336) 342-1394 or (336) 342-3537 (After Hours)    ° ° ° ° °

## 2015-05-26 ENCOUNTER — Ambulatory Visit (INDEPENDENT_AMBULATORY_CARE_PROVIDER_SITE_OTHER): Payer: Medicaid Other | Admitting: Dietician

## 2015-05-26 ENCOUNTER — Encounter: Payer: Self-pay | Admitting: Internal Medicine

## 2015-05-26 ENCOUNTER — Ambulatory Visit (INDEPENDENT_AMBULATORY_CARE_PROVIDER_SITE_OTHER): Payer: Medicaid Other | Admitting: Internal Medicine

## 2015-05-26 VITALS — BP 135/85 | HR 82 | Temp 98.2°F | Ht 68.0 in | Wt 239.1 lb

## 2015-05-26 DIAGNOSIS — E1136 Type 2 diabetes mellitus with diabetic cataract: Secondary | ICD-10-CM

## 2015-05-26 DIAGNOSIS — K047 Periapical abscess without sinus: Secondary | ICD-10-CM | POA: Diagnosis not present

## 2015-05-26 DIAGNOSIS — E1165 Type 2 diabetes mellitus with hyperglycemia: Secondary | ICD-10-CM | POA: Diagnosis not present

## 2015-05-26 DIAGNOSIS — I10 Essential (primary) hypertension: Secondary | ICD-10-CM | POA: Diagnosis not present

## 2015-05-26 DIAGNOSIS — E119 Type 2 diabetes mellitus without complications: Secondary | ICD-10-CM

## 2015-05-26 DIAGNOSIS — Z79899 Other long term (current) drug therapy: Secondary | ICD-10-CM

## 2015-05-26 DIAGNOSIS — Z713 Dietary counseling and surveillance: Secondary | ICD-10-CM

## 2015-05-26 LAB — GLUCOSE, CAPILLARY: Glucose-Capillary: 353 mg/dL — ABNORMAL HIGH (ref 65–99)

## 2015-05-26 MED ORDER — METFORMIN HCL 1000 MG PO TABS: 1000.0000 mg | ORAL_TABLET | Freq: Two times a day (BID) | ORAL | Status: DC

## 2015-05-26 NOTE — Patient Instructions (Signed)
General Instructions:   Thank you for bringing your medicines today. This helps [place] keep you safe from mistakes.    TAKE METFORMIN 500 MG (1 PILL) IN THE MORNING WITH A MEAL AND AT NIGHT WITH A MEAL FOR ONE WEEK.  NEXT WEEK TAKE 2 500 MG PILLS IN THE MORNING WITH A MEAL AND AT NIGHT WITH A MEAL.  ONCE YOU HAVE USED ALL OF YOUR METFORMIN 500 MG PILLS, PICK UP YOUR NEW PRESCRIPTION OF METFORMIN 1000 MG PILLS.   YOU WILL ONLY TAKE ONE OF THESE IN THE MORNING WITH A MEAL AN DONE OF THESE AT NIGHT WITH A MEAL.  CONTINUE TAKING ALL OF YOUR OTHER MEDICATIONS AS PRESCRIBED.  FOLLOW UP WITH YOUR DENTIST

## 2015-05-26 NOTE — Assessment & Plan Note (Addendum)
Lab Results  Component Value Date   HGBA1C 10.6* 04/19/2015   HGBA1C 7.3 03/18/2015   HGBA1C 7.4 03/09/2015     Assessment: Diabetes control:  Uncontrolled Progress toward A1C goal:    Slight improvement. CBGs were trending down into the 200s, now back in the 300s with recent dental abscess.  Comments: Patient has been taking Actos 30 mg QD, Amaryl 4 mg QD and Metformin 500 mg QD.   Plan: Medications: Continue Actos and amaryl as above. Increase Metformin to 500 mg BID for one week and then increase to 1000 mg BID.  Educational resources provided:   Met with Butch Penny Other plans: Follow up in 2 weeks. Hold off on obtaining urine microalbumin/creatinine ratio until diabetes is better controlled

## 2015-05-26 NOTE — Progress Notes (Signed)
Subjective:    Patient ID: Brady Church, male    DOB: 06-17-1961, 54 y.o.   MRN: [id number]  HPI [name] is a 54 yo male with PMHx of T2DM and HTN who presents for follow up for his T2DM. Please see problem oriented charting for more information.  Review of Systems  General: Denies fever, chills and diaphoresis.  HEENT: Admits to tooth pain and abscess.  Respiratory: Denies SOB, cough, DOE Cardiovascular: Denies chest pain and palpitations.  Gastrointestinal: Denies nausea, vomiting, abdominal pain, diarrhea Musculoskeletal: Denies myalgias, back pain Neurological: Denies headaches, weakness, numbness and syncope,  Past Medical History  Diagnosis Date  . Diabetes mellitus   . Hypertension   . Substance abuse     cocaine. Stopped in 2010  . High cholesterol   . Tobacco use disorder 10/07/2014  . 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 ACCU-CHEK FASTCLIX LANCETS MISC 1 each by Does not apply route 2 (two) times daily.   . carvedilol (COREG) 3.125 MG tablet Take 1 tablet (3.125 mg total) by mouth 2 (two) times daily with a meal.   . dorzolamide-timolol (COSOPT) 22.3-6.8 MG/ML ophthalmic solution Place 1 drop into the right eye 2 (two) times daily. 04/19/2015: .   Marland Kitchen DUREZOL 0.05 % EMUL Place 1 drop into the right eye 4 (four) times daily. 04/19/2015: .   . glimepiride (AMARYL) 4 MG tablet Take 1 tablet (4 mg total) by mouth daily with breakfast.   . glucose blood (ACCU-CHEK SMARTVIEW) test strip Use as instructed   . hydrochlorothiazide (HYDRODIURIL) 25 MG tablet Take 1 tablet (25 mg total) by mouth daily.   Marland Kitchen lovastatin (MEVACOR) 40 MG tablet Take 1 tablet (40 mg total) by mouth daily.   . metFORMIN (GLUCOPHAGE) 1000 MG tablet Take 1 tablet (1,000 mg total) by mouth 2 (two) times daily with a meal.   . oxyCODONE-acetaminophen (PERCOCET/ROXICET) 5-325 MG per tablet Take 1-2  tablets by mouth every 4 (four) hours as needed for moderate pain or severe pain.   Marland Kitchen penicillin v potassium (VEETID) 500 MG tablet Take 1 tablet (500 mg total) by mouth 4 (four) times daily.   . pioglitazone (ACTOS) 30 MG tablet Take 0.5 tablets (15 mg total) by mouth daily.   . Potassium Chloride ER 20 MEQ TBCR Take 20 mEq by mouth daily. (Patient not taking: Reported on 03/09/2015)   . [DISCONTINUED] metFORMIN (GLUCOPHAGE) 500 MG tablet Take 0.5 tablets (250 mg total) by mouth daily after supper.    No facility-administered encounter medications on file as of 05/26/2015.       Objective:   Physical Exam Filed Vitals:   05/26/15 1501  BP: 135/85  Pulse: 82  Temp: 98.2 F (36.8 C)  TempSrc: Oral  Height: 5\' 8"  (1.727 m)  Weight: 239 lb 1.6 oz (108.455 kg)  SpO2: 100%   General: Vital signs reviewed.  Patient is obese, in no acute distress and cooperative with exam.  HEENT: Left upper dental abscess, mildly tender, no obvious drainage, poor dentition Cardiovascular: RRR, S1 normal, S2 normal Pulmonary/Chest: Clear to auscultation bilaterally, no wheezes, rales, or rhonchi. Abdominal: Soft, non-tender, non-distended, BS + Extremities: No lower extremity edema bilaterally, pulses symmetric and intact bilaterally.  Skin: Warm, dry and intact. No rashes or erythema. Psychiatric: Normal mood and affect. speech and behavior is normal. Cognition and memory are normal.  Assessment & Plan:   Please see problem oriented assessment and plan.

## 2015-05-26 NOTE — Assessment & Plan Note (Addendum)
Patient was recently seen in the Emergency Department for complaint of a left upper dental abscess. Patient was prescribed pain medication, penicillin, and instructed to follow up with an oral surgeon/dentist. Patient has not yet seen a dentist and requests help with setting this up. He has been compliant with penicillin and states his pain has greatly improved. He take percocet infrequently. He denies any fever or chills.   Plan: -Referral to dentist -Continue Penicillin 500 mg Q6H  -Percocet prn for pain

## 2015-05-26 NOTE — Assessment & Plan Note (Addendum)
BP Readings from Last 3 Encounters:  05/26/15 135/85  05/24/15 170/94  05/12/15 133/79    Lab Results  Component Value Date   NA 133* 05/24/2015   K 3.6 05/24/2015   CREATININE 0.84 05/24/2015    Assessment: Blood pressure control:   Well controlled Progress toward BP goal:   Improved Comments: Patient is on HCTZ 25 mg QD and Coreg 3.125 mg BID. Discussed adding low dose ACEI and/or increasing Beta Blocker. Patient requests to wait on these changes has he is currently overwhelmed with his current number of medications and his uncontrolled diabetes.  Plan: Medications: Continue HCTZ and Coreg as above. Add ACEI and/or increase BB when able.

## 2015-05-28 ENCOUNTER — Encounter: Payer: Self-pay | Admitting: Dietician

## 2015-05-28 NOTE — Progress Notes (Signed)
INTERNAL MEDICINE TEACHING ATTENDING ADDENDUM - Nary Sneed, MD: I reviewed and discussed at the time of visit with the resident Dr. Richardson, the patient's medical history, physical examination, diagnosis and results of pertinent tests and treatment and I agree with the patient's care as documented.  

## 2015-05-28 NOTE — Progress Notes (Signed)
Diabetes Self-Management Education  Visit Type: First/Initial  Appt. Start Time: 1430 Appt. End Time: 1530  05/28/2015  Mr. Brady Church, identified by name and date of birth, is a 54 y.o. male with a diagnosis of Diabetes: Type 2.      ASSESSMENT  Most recent:  Weight- 239# BMI-36. A1C 10.6%  Initial Visit Information:  Are you currently following a meal plan?: No   Are you taking your medications as prescribed?: Yes Are you checking your feet?: Yes How many days per week are you checking your feet?: 7 How often do you need to have someone help you when you read instructions, pamphlets, or other written materials from your doctor or pharmacy?: 4 - Often    Psychosocial:   Patient Belief/Attitude about Diabetes: Motivated to manage diabetes Self-care barriers: Lack of transportation, Lack of material resources Other persons present: Patient, Friend Patient Concerns: Glycemic Control Special Needs: Large print, Verbal instruction Preferred Learning Style: Auditory Learning Readiness: Ready  Complications:   Fasting Blood glucose range (mg/dL): >892 Postprandial Blood glucose range (mg/dL): >119 Number of hypoglycemic episodes per month: 0 Number of hyperglycemic episodes per week:  (100) Have you had a dilated eye exam in the past 12 months?: Yes  Diet Intake:  Breakfast: coffee, juice 8 oz, milk 8 0z, breadx2, jelly coffee, eggs, grits Lunch: steak,gravy, potatoes, roll, peas & carrots, cake,  Snack (afternoon): applesauce Dinner: beef ribs, gravy, limas, 2 rolls, cookie, sweet tea Beverage(s): water, juice,. milk, coffee, tea  Exercise:  Exercise: ADL's  Individualized Plan for Diabetes Self-Management Training:   Learning Objective:  Patient will have a greater understanding of diabetes self-management.  Patient education plan per assessed needs and concerns is to attend individual sessions for meal panning, monitoring, physical activity, how to handle  lows and highs, chronic complications     Education Topics Reviewed with Patient Today:    Role of diet in the treatment of diabetes and the relationship between the three main macronutrients and blood glucose level, Food label reading, portion sizes and measuring food., Effects of alcohol on blood glucose and safety factors with consumption of alcohol. Role of exercise on diabetes management, blood pressure control and cardiac health.     Discussed and identified patients' treatment of hyperglycemia.          PATIENTS GOALS/Plan (Developed by the patient):  Physical Activity: Exercise 3-5 times per week, 30 minutes per day  Plan:   There are no Patient Instructions on file for this visit.  Expected Outcomes:  Demonstrated interest in learning. Expect positive outcomes  Education material provided: Living Well with Diabetes  If problems or questions, patient to contact team via:  Phone  Future DSME appointment: 2 wks

## 2015-06-01 ENCOUNTER — Other Ambulatory Visit: Payer: Self-pay | Admitting: Internal Medicine

## 2015-06-03 ENCOUNTER — Other Ambulatory Visit: Payer: Self-pay | Admitting: Internal Medicine

## 2015-06-04 NOTE — Addendum Note (Signed)
Addended by: Neomia Dear on: 06/04/2015 07:12 PM   Modules accepted: Orders

## 2015-06-08 ENCOUNTER — Telehealth: Payer: Self-pay | Admitting: *Deleted

## 2015-06-08 NOTE — Telephone Encounter (Signed)
PATIENT WAS CONTACTED WITH APPOINTMENT FOR DENTAL/ Brady Church / July 18.016 @ 12:00 NOON. PATIENT WAS IN CAR , UNABLE TO WRITE INFORMATION DOWN. HE WILL CALL [facility] WHEN HE GETS HOME . APPT TO BE GIVEN WHEN HE CALL BACK  TO [facility] AT [phone].

## 2015-06-08 NOTE — Telephone Encounter (Signed)
ERROR

## 2015-06-08 NOTE — Telephone Encounter (Signed)
Patient called back and i gave him the information regarding his appti

## 2015-06-09 ENCOUNTER — Ambulatory Visit (INDEPENDENT_AMBULATORY_CARE_PROVIDER_SITE_OTHER): Payer: Medicaid Other | Admitting: Internal Medicine

## 2015-06-09 ENCOUNTER — Encounter: Payer: Self-pay | Admitting: Dietician

## 2015-06-09 ENCOUNTER — Ambulatory Visit (INDEPENDENT_AMBULATORY_CARE_PROVIDER_SITE_OTHER): Payer: Medicaid Other | Admitting: Dietician

## 2015-06-09 ENCOUNTER — Encounter: Payer: Self-pay | Admitting: Internal Medicine

## 2015-06-09 VITALS — Wt 244.1 lb

## 2015-06-09 VITALS — BP 147/80 | HR 72 | Temp 97.5°F | Wt 244.1 lb

## 2015-06-09 DIAGNOSIS — Z79899 Other long term (current) drug therapy: Secondary | ICD-10-CM

## 2015-06-09 DIAGNOSIS — Z598 Other problems related to housing and economic circumstances: Secondary | ICD-10-CM

## 2015-06-09 DIAGNOSIS — E1136 Type 2 diabetes mellitus with diabetic cataract: Secondary | ICD-10-CM

## 2015-06-09 DIAGNOSIS — Z713 Dietary counseling and surveillance: Secondary | ICD-10-CM

## 2015-06-09 DIAGNOSIS — H540 Blindness, both eyes: Secondary | ICD-10-CM

## 2015-06-09 DIAGNOSIS — K047 Periapical abscess without sinus: Secondary | ICD-10-CM

## 2015-06-09 DIAGNOSIS — I10 Essential (primary) hypertension: Secondary | ICD-10-CM

## 2015-06-09 DIAGNOSIS — E119 Type 2 diabetes mellitus without complications: Secondary | ICD-10-CM

## 2015-06-09 DIAGNOSIS — E1165 Type 2 diabetes mellitus with hyperglycemia: Secondary | ICD-10-CM | POA: Diagnosis not present

## 2015-06-09 LAB — GLUCOSE, CAPILLARY: GLUCOSE-CAPILLARY: 132 mg/dL — AB (ref 65–99)

## 2015-06-09 MED ORDER — GLUCOSE BLOOD VI STRP: ORAL_STRIP | Status: DC

## 2015-06-09 NOTE — Progress Notes (Signed)
Subjective:    Patient ID: Brady Church, male    DOB: 1961/09/07, 54 y.o.   MRN: [id number]  HPI Brady Church is a 54 yo male with PMHx of T2DM, HTN, and recent dental abscess who presents for follow up for his T2DM. Please see problem oriented assessment and plan for more information.  Review of Systems General: Denies fever, chills, fatigue, change in appetite and diaphoresis.  Respiratory: Denies SOB, cough, DOE, chest tightness, and wheezing.   Cardiovascular: Denies chest pain and palpitations.  Gastrointestinal: Denies nausea, vomiting, abdominal pain, diarrhea, constipation, blood in stool and abdominal distention.  Genitourinary: Denies dysuria, urgency, frequency, hematuria, suprapubic pain and flank pain. Endocrine: Denies hot or cold intolerance, polyuria, and polydipsia. Musculoskeletal: Denies myalgias, back pain, joint swelling, arthralgias and gait problem.  Skin: Denies pallor, rash and wounds.  Neurological: Denies dizziness, headaches, weakness, lightheadedness, numbness,seizures, and syncope, Psychiatric/Behavioral: Denies mood changes, confusion, nervousness, sleep disturbance and agitation.  Past Medical History  Diagnosis Date  . Diabetes mellitus   . Hypertension   . Substance abuse     cocaine. Stopped in 2010  . High cholesterol   . Tobacco use disorder 10/07/2014  . Blind     blind in left eye, very limited vision right eye    Outpatient Encounter Prescriptions as of 06/09/2015  Medication Sig Note  . ACCU-CHEK FASTCLIX LANCETS MISC 1 each by Does not apply route 2 (two) times daily.   . carvedilol (COREG) 3.125 MG tablet TAKE ONE TABLET BY MOUTH TWICE DAILY WITH MEALS   . dorzolamide-timolol (COSOPT) 22.3-6.8 MG/ML ophthalmic solution Place 1 drop into the right eye 2 (two) times daily. 04/19/2015: .   Marland Kitchen DUREZOL 0.05 % EMUL Place 1 drop into the right eye 4 (four) times daily. 04/19/2015: .   . glimepiride (AMARYL) 4 MG tablet Take 1 tablet (4 mg  total) by mouth daily with breakfast.   . glucose blood (ACCU-CHEK SMARTVIEW) test strip Use as instructed   . hydrochlorothiazide (HYDRODIURIL) 25 MG tablet Take 1 tablet (25 mg total) by mouth daily.   Marland Kitchen lovastatin (MEVACOR) 40 MG tablet Take 1 tablet (40 mg total) by mouth daily.   . metFORMIN (GLUCOPHAGE) 1000 MG tablet Take 1 tablet (1,000 mg total) by mouth 2 (two) times daily with a meal.   . oxyCODONE-acetaminophen (PERCOCET/ROXICET) 5-325 MG per tablet Take 1-2 tablets by mouth every 4 (four) hours as needed for moderate pain or severe pain.   . pioglitazone (ACTOS) 30 MG tablet Take 0.5 tablets (15 mg total) by mouth daily.   . Potassium Chloride ER 20 MEQ TBCR Take 20 mEq by mouth daily. (Patient not taking: Reported on 03/09/2015)   . [DISCONTINUED] glucose blood (ACCU-CHEK SMARTVIEW) test strip Use as instructed    No facility-administered encounter medications on file as of 06/09/2015.       Objective:   Physical Exam Filed Vitals:   06/09/15 1511  BP: 147/80  Pulse: 72  Temp: 97.5 F (36.4 C)  TempSrc: Oral  Weight: 244 lb 1.6 oz (110.723 kg)  SpO2: 98%   General: Vital signs reviewed.  Patient is well-developed and well-nourished, in no acute distress and cooperative with exam.  Cardiovascular: RRR, S1 normal, S2 normal Pulmonary/Chest: Clear to auscultation bilaterally, no wheezes, rales, or rhonchi. Abdominal: Soft, non-tender, non-distended, BS + Extremities: No lower extremity edema bilaterally, pulses symmetric and intact bilaterally.  Skin: Warm, dry and intact. No rashes or erythema. Psychiatric: Normal mood and affect. speech and behavior  is normal. Cognition and memory are normal.     Assessment & Plan:   Please see problem based assessment and plan.

## 2015-06-09 NOTE — Patient Instructions (Signed)
RETURN IN 5 TO 6 WEEKS  CONTINUE TAKING ALL OF YOUR MEDICATIONS AS PRESCRIBED.  Blood Glucose Monitoring Monitoring your blood glucose (also know as blood sugar) helps you to manage your diabetes. It also helps you and your health care provider monitor your diabetes and determine how well your treatment plan is working. WHY SHOULD YOU MONITOR YOUR BLOOD GLUCOSE?  It can help you understand how food, exercise, and medicine affect your blood glucose.  It allows you to know what your blood glucose is at any given moment. You can quickly tell if you are having low blood glucose (hypoglycemia) or high blood glucose (hyperglycemia).  It can help you and your health care provider know how to adjust your medicines.  It can help you understand how to manage an illness or adjust medicine for exercise. WHEN SHOULD YOU TEST? Your health care provider will help you decide how often you should check your blood glucose. This may depend on the type of diabetes you have, your diabetes control, or the types of medicines you are taking. Be sure to write down all of your blood glucose readings so that this information can be reviewed with your health care provider. See below for examples of testing times that your health care provider may suggest. Type 1 Diabetes  Test 4 times a day if you are in good control, using an insulin pump, or perform multiple daily injections.  If your diabetes is not well controlled or if you are sick, you may need to monitor more often.  It is a good idea to also monitor:  Before and after exercise.  Between meals and 2 hours after a meal.  Occasionally between 2:00 a.m. and 3:00 a.m. Type 2 Diabetes  It can vary with each person, but generally, if you are on insulin, test 4 times a day.  If you take medicines by mouth (orally), test 2 times a day.  If you are on a controlled diet, test once a day.  If your diabetes is not well controlled or if you are sick, you may  need to monitor more often. HOW TO MONITOR YOUR BLOOD GLUCOSE Supplies Needed  Blood glucose meter.  Test strips for your meter. Each meter has its own strips. You must use the strips that go with your own meter.  A pricking needle (lancet).  A device that holds the lancet (lancing device).  A journal or log book to write down your results. Procedure  Wash your hands with soap and water. Alcohol is not preferred.  Prick the side of your finger (not the tip) with the lancet.  Gently milk the finger until a small drop of blood appears.  Follow the instructions that come with your meter for inserting the test strip, applying blood to the strip, and using your blood glucose meter. Other Areas to Get Blood for Testing Some meters allow you to use other areas of your body (other than your finger) to test your blood. These areas are called alternative sites. The most common alternative sites are:  The forearm.  The thigh.  The back area of the lower leg.  The palm of the hand. The blood flow in these areas is slower. Therefore, the blood glucose values you get may be delayed, and the numbers are different from what you would get from your fingers. Do not use alternative sites if you think you are having hypoglycemia. Your reading will not be accurate. Always use a finger if you are having  hypoglycemia. Also, if you cannot feel your lows (hypoglycemia unawareness), always use your fingers for your blood glucose checks. ADDITIONAL TIPS FOR GLUCOSE MONITORING  Do not reuse lancets.  Always carry your supplies with you.  All blood glucose meters have a 24-hour "hotline" number to call if you have questions or need help.  Adjust (calibrate) your blood glucose meter with a control solution after finishing a few boxes of strips. BLOOD GLUCOSE RECORD KEEPING It is a good idea to keep a daily record or log of your blood glucose readings. Most glucose meters, if not all, keep your glucose  records stored in the meter. Some meters come with the ability to download your records to your home computer. Keeping a record of your blood glucose readings is especially helpful if you are wanting to look for patterns. Make notes to go along with the blood glucose readings because you might forget what happened at that exact time. Keeping good records helps you and your health care provider to work together to achieve good diabetes management.  Document Released: 11/16/2003 Document Revised: 03/30/2014 Document Reviewed: 04/07/2013 [facility] Patient Information 2015 [place], [place]. This information is not intended to replace advice given to you by your health care provider. Make sure you discuss any questions you have with your health care provider.

## 2015-06-09 NOTE — Progress Notes (Signed)
Medicine attending: Medical history, presenting problems, physical findings, and medications, reviewed with Dr Alexa Richardson and I concur with her evaluation and management plan. 

## 2015-06-09 NOTE — Assessment & Plan Note (Signed)
Patient denies pain, odorous breath or bad taste in mouth.   Plan: -Dentist appointment on 06/14/15 at noon

## 2015-06-09 NOTE — Progress Notes (Signed)
Diabetes Self-Management Education  Visit Type: Follow-up  Appt. Start Time: 1430 Appt. End Time: 1500  06/09/2015  Mr. Brady Church, identified by name and date of birth, is a 54 y.o. male with a diagnosis of Diabetes: Type 2.     Reports is smoking 4 cigarettes a day and 1 16 oz beer a day.  ASSESSMENT  Most recent:  Weight- 244.1# BMI-37. A1C 10.6%    Subsequent Visit Information:  Since your last visit, have you continued or began the use of a meal plan?: Yes How many days a week are you following a meal plan?: 7 Since your last visit, have you continued or began to exercise on a consistent basis?: Yes Since your last visit have you continued or begun to take your medications as prescribed?: Yes Since your last visit have you had your blood pressure checked?: Yes Is your most recent blood pressure lower, unchanged, or higher since your last visit?: Higher Since your last visit are you checking your feet?: Yes How many days per week are you checking your feet?: 7 Since your last visit have you experienced any weight changes?: No change Since your last visit, are you checking your blood glucose at least once a day?: Yes  Psychosocial:     Patient Belief/Attitude about Diabetes: Motivated to manage diabetes Self-care barriers: Impaired vision, Lack of transportation, Lack of material resources Self-management support: Doctor's office, Friends, Family, CDE visits Patient Concerns: Glycemic Control, Weight Control Special Needs: Large print, Verbal instruction Preferred Learning Style: Auditory Learning Readiness: Ready  Complications:   How often do you check your blood sugar?: 1-2 times/day (ran out of strips thinks someone stole them) Fasting Blood glucose range (mg/dL): 102-725 Postprandial Blood glucose range (mg/dL): 366-440 Number of hypoglycemic episodes per month:  (gets shaky if he doesn;t eat a snack around 2 PM to 6 PM) Number of hyperglycemic episodes  per week:  (2-3) Have you had a dental exam in the past 12 months?: No (has an appointment this monday)  Diet Intake:  Breakfast: 7am- sausage eggs, grits, ypasdt x1 or 2, coffee Lunch: 1130am- pork, hamburger, pasta salad,  Snack (afternoon): granola bar or pack of crackers Dinner: 7pm salad, green beans Beverage(s): water, v-8 vegetables low sodium,   Exercise:  Exercise: Light (walking / raking leaves) Light Exercise amount of time (min / week): 80   Individualized Plan for Diabetes Self-Management Training:   Learning Objective:  Patient will have a greater understanding of diabetes self-management.  Patient education plan per assessed needs and concerns is to attend individual sessions for   or meal planning, monitoring, physical activity, how to handle lows and highs, chronic complications    Education Topics Reviewed with Patient Today:  Identified appropriate SMBG and/or A1C goals. Taught treatment of hypoglycemia - the 15 rule. Dental care Reviewed alcohol effects on diabetes and safety steps needed to prevent hypoglycemia     PATIENTS GOALS/Plan (Developed by the patient):  Physical Activity: Exercise 3-5 times per week, 30 minutes per day  Patient Self Evaluation of Goals - Patient rates self as meeting previously set goals:   Physical Activity: 50 - 75 %   Expected Outcomes:  Demonstrated interest in learning. Expect positive outcomes Education material provided: How to handle low blood sugars audio If problems or questions, patient to contact team via:  Phone Future DSME appointment: 4-6 wks

## 2015-06-09 NOTE — Assessment & Plan Note (Signed)
Lab Results  Component Value Date   HGBA1C 10.6* 04/19/2015   HGBA1C 7.3 03/18/2015   HGBA1C 7.4 03/09/2015     Assessment: Diabetes control:  Uncontrolled, but greatly improved Progress toward A1C goal:   Improving Comments: Patient has been compliant with Metformin 1000 mg BID, Amaryl 4 mg daily, and actos 30 mg daily. He CBGs have greatly improved to the 150s range from previously being in the 300s!  Plan: Medications:  continue current medications Home glucose monitoring: Frequency:  once daily Timing:  In the morning Instruction/counseling given: reminded to bring blood glucose meter & log to each visit, discussed foot care and discussed diet Educational resources provided:   Self management tools provided: copy of home glucose meter download Other plans: I am very proud of Brady Church and his compliance with medications and lifestyle changes despite being homeless, blind and having financial difficulties. We will continue the current plan and recheck his HgbA1c in about 5 weeks.

## 2015-06-09 NOTE — Patient Instructions (Signed)
Good job taking care of yourself and your diabetes-   Walking every other day  Taking your Medicine Checking your blood sugar  Bringing your medicine and meter to your appointment Eating healthier and smaller portions Checking your feet Cutting back on alcohol and cigarettes  Please make an appointment to see me in about 4 weeks.

## 2015-06-09 NOTE — Assessment & Plan Note (Signed)
BP Readings from Last 3 Encounters:  06/09/15 147/80  05/26/15 135/85  05/24/15 170/94    Lab Results  Component Value Date   NA 133* 05/24/2015   K 3.6 05/24/2015   CREATININE 0.84 05/24/2015    Assessment: Blood pressure control:  Mildly uncontrolled Progress toward BP goal:   Above goal of <140/90 Comments: Patient has been compliant with HCTZ 25 mg daily and coreg 3.125 mg BID.   Plan: Medications:  continue current medications Other plans: He would like to continue the medications for another month. At follow up visit in 5-6 weeks we can check urine microalbumin/creatinine ratio. Based on results, patient can be started on ACEI versus increase of BB. Ideally both would be started, but due to limited finances and difficultly with medications, the best plan for Brady Church is small changes over time.

## 2015-06-11 NOTE — Progress Notes (Signed)
Medicine attending: Medical history, presenting problems, physical findings, and medications, reviewed with Dr Alexa Richardson on the day of the patient visit and I concur with her evaluation and management plan. 

## 2015-07-21 ENCOUNTER — Ambulatory Visit (INDEPENDENT_AMBULATORY_CARE_PROVIDER_SITE_OTHER): Payer: Medicaid Other | Admitting: Internal Medicine

## 2015-07-21 ENCOUNTER — Encounter: Payer: Self-pay | Admitting: Internal Medicine

## 2015-07-21 VITALS — BP 133/76 | HR 90 | Temp 98.0°F | Ht 68.0 in | Wt 243.0 lb

## 2015-07-21 DIAGNOSIS — I10 Essential (primary) hypertension: Secondary | ICD-10-CM | POA: Diagnosis present

## 2015-07-21 DIAGNOSIS — Z79899 Other long term (current) drug therapy: Secondary | ICD-10-CM | POA: Diagnosis not present

## 2015-07-21 DIAGNOSIS — I209 Angina pectoris, unspecified: Secondary | ICD-10-CM | POA: Diagnosis not present

## 2015-07-21 DIAGNOSIS — E1136 Type 2 diabetes mellitus with diabetic cataract: Secondary | ICD-10-CM

## 2015-07-21 DIAGNOSIS — I208 Other forms of angina pectoris: Secondary | ICD-10-CM

## 2015-07-21 DIAGNOSIS — E785 Hyperlipidemia, unspecified: Secondary | ICD-10-CM | POA: Diagnosis not present

## 2015-07-21 DIAGNOSIS — E1165 Type 2 diabetes mellitus with hyperglycemia: Secondary | ICD-10-CM

## 2015-07-21 DIAGNOSIS — Z299 Encounter for prophylactic measures, unspecified: Secondary | ICD-10-CM

## 2015-07-21 DIAGNOSIS — Z125 Encounter for screening for malignant neoplasm of prostate: Secondary | ICD-10-CM

## 2015-07-21 LAB — GLUCOSE, CAPILLARY: Glucose-Capillary: 148 mg/dL — ABNORMAL HIGH (ref 65–99)

## 2015-07-21 LAB — POCT GLYCOSYLATED HEMOGLOBIN (HGB A1C): Hemoglobin A1C: 8.3

## 2015-07-21 MED ORDER — LISINOPRIL 10 MG PO TABS: 10.0000 mg | ORAL_TABLET | Freq: Every day | ORAL | Status: DC

## 2015-07-21 MED ORDER — ASPIRIN EC 81 MG PO TBEC: 81.0000 mg | DELAYED_RELEASE_TABLET | Freq: Every day | ORAL | Status: DC

## 2015-07-21 NOTE — Assessment & Plan Note (Addendum)
BP Readings from Last 3 Encounters:  07/21/15 133/76  06/09/15 147/80  05/26/15 135/85    Lab Results  Component Value Date   NA 133* 05/24/2015   K 3.6 05/24/2015   CREATININE 0.84 05/24/2015    Assessment: Blood pressure control:  Controlled Progress toward BP goal:   At goal, apparently has been HTN at home from what he reports his [facility] nurse tells him Comments: Patient is on HCTZ 25 mg daily and Coreg 3.125 mg daily.   Plan: Medications:  Continue above medications, add lisinopril 10 mg daily.  Other plans: Check urine microalb/creatine ratio

## 2015-07-21 NOTE — Assessment & Plan Note (Signed)
On lovastatin 40 mg daily.

## 2015-07-21 NOTE — Assessment & Plan Note (Addendum)
Lab Results  Component Value Date   HGBA1C 8.3 07/21/2015   HGBA1C 10.6* 04/19/2015   HGBA1C 7.3 03/18/2015     Assessment: Diabetes control:  Uncontrolled Progress toward A1C goal:   Improving Comments: Patient was supposed to be on Metformin 1000 mg BID, amaryl 4 mg daily, and actos 30 mg daily; however, he has only been taking Metformin 1000 mg BID. HgbA1c has improved from 10.6>8.3. CBGs at home range from 110-200.   Plan: Medications: Patient would like to continue current regimen for now.  Home glucose monitoring: Frequency:  QD Timing:  QAM Instruction/counseling given: discussed foot care and discussed diet Other plans: Check urine microalbumin/creatinine ratio, add lisinopril 10 mg daily, recheck in 3 months. If still in 8s or deteriorated, consider adding glimepiride 4 mg daily back. Referral to podiatrist.

## 2015-07-21 NOTE — Assessment & Plan Note (Signed)
Patient requests prostate screening. He denies family history, hematuria, urinary hesitancy, but he has several friends who were diagnosed. Risk/benefits were discussed with patient and he would like to proceed with PSA screening.   Plan: -PSA level

## 2015-07-21 NOTE — Patient Instructions (Signed)
CONTINUE TAKING ALL MEDICATIONS THE SAME.  BEGIN TAKING ASPIRIN 81 MG DAILY AND LISINOPRIL 10 MG (1 PILL) DAILY.  I HAVE PLACED A REFERRAL FOR PODIATRY AND CARDIOLOGY.  FOLLOW UP 3 MONTHS.

## 2015-07-21 NOTE — Progress Notes (Signed)
Subjective:    Patient ID: Brady Church, male    DOB: Nov 25, 1961, 54 y.o.   MRN: [id number]  HPI Brady Church is a 54 yo male with PMHx of chronic sCHF EF 40-45%, HLD, HTN and T2DM who presents for follow up for HTN and T2DM. Please see problem oriented assessment and plan for more information.  Review of Systems General: Denies fever, chills, fatigue Respiratory: Denies SOB, cough, DOE Cardiovascular: Denies chest pain and palpitations.  Gastrointestinal: Denies abdominal pain, diarrhea, constipation, blood in stool  Genitourinary: Denies frequency, hematuria, hesitancy. Skin: Denies pallor, rash and wounds.  Neurological: Denies dizziness, headaches, weakness, lightheadedness  Past Medical History  Diagnosis Date  . Diabetes mellitus   . Hypertension   . Substance abuse     cocaine. Stopped in 2010  . High cholesterol   . Tobacco use disorder 10/07/2014  . Blind     blind in left eye, very limited vision right eye    Outpatient Encounter Prescriptions as of 07/21/2015  Medication Sig Note  . ACCU-CHEK FASTCLIX LANCETS MISC 1 each by Does not apply route 2 (two) times daily.   Marland Kitchen aspirin EC 81 MG tablet Take 1 tablet (81 mg total) by mouth daily.   . carvedilol (COREG) 3.125 MG tablet TAKE ONE TABLET BY MOUTH TWICE DAILY WITH MEALS   . dorzolamide-timolol (COSOPT) 22.3-6.8 MG/ML ophthalmic solution Place 1 drop into the right eye 2 (two) times daily. 04/19/2015: .   Marland Kitchen DUREZOL 0.05 % EMUL Place 1 drop into the right eye 4 (four) times daily. 04/19/2015: .   . glucose blood (ACCU-CHEK SMARTVIEW) test strip Use as instructed   . hydrochlorothiazide (HYDRODIURIL) 25 MG tablet Take 1 tablet (25 mg total) by mouth daily.   Marland Kitchen lisinopril (PRINIVIL,ZESTRIL) 10 MG tablet Take 1 tablet (10 mg total) by mouth daily.   Marland Kitchen lovastatin (MEVACOR) 40 MG tablet Take 1 tablet (40 mg total) by mouth daily.   . metFORMIN (GLUCOPHAGE) 1000 MG tablet Take 1 tablet (1,000 mg total) by mouth 2 (two)  times daily with a meal.   . Potassium Chloride ER 20 MEQ TBCR Take 20 mEq by mouth daily. (Patient not taking: Reported on 03/09/2015)   . [DISCONTINUED] glimepiride (AMARYL) 4 MG tablet Take 1 tablet (4 mg total) by mouth daily with breakfast.   . [DISCONTINUED] oxyCODONE-acetaminophen (PERCOCET/ROXICET) 5-325 MG per tablet Take 1-2 tablets by mouth every 4 (four) hours as needed for moderate pain or severe pain.   . [DISCONTINUED] pioglitazone (ACTOS) 30 MG tablet Take 0.5 tablets (15 mg total) by mouth daily.    No facility-administered encounter medications on file as of 07/21/2015.      Objective:   Physical Exam Filed Vitals:   07/21/15 1346  BP: 133/76  Pulse: 90  Temp: 98 F (36.7 C)  TempSrc: Oral  Height: 5\' 8"  (1.727 m)  Weight: 243 lb (110.224 kg)  SpO2: 97%   General: Vital signs reviewed.  Patient is well-developed and well-nourished, in no acute distress and cooperative with exam.  Cardiovascular: RRR, S1 normal, S2 normal, no murmurs, gallops, or rubs. Pulmonary/Chest: Clear to auscultation bilaterally, no wheezes, rales, or rhonchi. Abdominal: Soft, non-tender, non-distended, BS + Extremities: No lower extremity edema bilaterally, pulses symmetric and intact bilaterally. Neurological: A&O x3 Skin: Warm, dry and intact. No rashes or erythema. Psychiatric: Normal mood and affect. speech and behavior is normal. Cognition and memory are normal.     Assessment & Plan:   Please see problem oriented  assessment and plan.

## 2015-07-21 NOTE — Assessment & Plan Note (Signed)
Patient denies recurrent chest pain or DOE. He does have significant risk factors and would benefit from ASA therapy. He has not followed up with Cardiology.   Plan: -Referral to Cardiology -Continue lovastatin 40 mg daily -Add ASA 81 mg daily -Add lisinopril 10 mg daily -Continue carvedilol 3.125 mg BID -Continue HCTZ 25 mg daily

## 2015-07-22 LAB — PSA: Prostate Specific Ag, Serum: 0.8 ng/mL (ref 0.0–4.0)

## 2015-07-22 LAB — MICROALBUMIN / CREATININE URINE RATIO
Creatinine, Urine: 45.4 mg/dL
MICROALB/CREAT RATIO: 36.3 mg/g creat — ABNORMAL HIGH (ref 0.0–30.0)
Microalbumin, Urine: 16.5 ug/mL

## 2015-07-26 NOTE — Progress Notes (Signed)
Internal Medicine Clinic Attending  Case discussed with Dr. Richardson soon after the resident saw the patient.  We reviewed the resident's history and exam and pertinent patient test results.  I agree with the assessment, diagnosis, and plan of care documented in the resident's note. 

## 2015-08-03 ENCOUNTER — Encounter: Payer: Medicaid Other | Admitting: Dietician

## 2015-08-06 ENCOUNTER — Ambulatory Visit (INDEPENDENT_AMBULATORY_CARE_PROVIDER_SITE_OTHER): Payer: Medicaid Other | Admitting: Dietician

## 2015-08-06 ENCOUNTER — Encounter: Payer: Self-pay | Admitting: Dietician

## 2015-08-06 VITALS — Wt 241.4 lb

## 2015-08-06 DIAGNOSIS — E119 Type 2 diabetes mellitus without complications: Secondary | ICD-10-CM | POA: Diagnosis not present

## 2015-08-06 DIAGNOSIS — E1136 Type 2 diabetes mellitus with diabetic cataract: Secondary | ICD-10-CM

## 2015-08-06 DIAGNOSIS — Z713 Dietary counseling and surveillance: Secondary | ICD-10-CM

## 2015-08-06 NOTE — Patient Instructions (Addendum)
Your foot care doctor visit is: Wednesday September 14 at 2:30 pm with Dr. Leeanne Deed                                              [address], [place], [place] [address]                                              [phone]   Try to Eat mostly baked food  Baked potato or chili instead of fries Good job Cutting back on alcohol and cigarettes!!!  Please make an appointment to see me in about 4 weeks.

## 2015-08-06 NOTE — Progress Notes (Signed)
Diabetes Self-Management Education  Visit Type:  Follow-up  Appt. Start Time: 0933 Appt. End Time: 1010  08/06/2015  Mr. Brady Church, identified by name and date of birth, is a 54 y.o. male with a diagnosis of Diabetes: Type 2.    ASSESSMENT CBG 145 in office  Having trouble with his meter giving him error messages. We called [employer] today and they said his strips are bad. They will mail him a new bottle  Weight 241 lb 6.4 oz (109.498 kg). Body mass index is 36.71 kg/(m^2).       Diabetes Self-Management Education - 08/06/15 1100    Initial Visit   What type of meal plan do you follow? 3 meals a day, decreased portions, les sugary drinks   Health Coping   How would you rate your overall health? Good   Psychosocial Assessment   Patient Belief/Attitude about Diabetes Motivated to manage diabetes   Self-care barriers Impaired vision;Lack of transportation;Lack of material resources   Self-management support Doctor's office;Friends;CDE visits   Patient Concerns Glycemic Control   Special Needs Large print   Preferred Learning Style Auditory   Learning Readiness Ready   Complications   Fasting Blood glucose range (mg/dL) 161-096   Postprandial Blood glucose range (mg/dL) 045-409   Number of hypoglycemic episodes per month 0   Number of hyperglycemic episodes per week 0   Have you had a dilated eye exam in the past 12 months? Yes   Dietary Intake   Breakfast sauage and egg sandwich, grits, juice   Snack (morning) 16 oz beer   Lunch sandwich and chuips, water   Snack (afternoon) 16 oz beer   Dinner chicken wigns, small order of fries, or lasgna or spaghetti, green beans   Exercise   Exercise Type Light (walking / raking leaves)   Patient Education   Previous Diabetes Education Yes (please comment)   Patient Self-Evaluation of Goals - Patient rates self as meeting previously set goals (% of time)   Physical Activity >75%   Outcomes   Program Status Completed   Subsequent Visit   Since your last visit, have you continued or began the use of a meal plan? Yes   How many days a week are you following a meal plan? 7   Since your last visit, have you continued or began to exercise on a consistent basis? Yes   How many days per week are you exercising or participating in a physicial activity for more than 20 minutes? 4   Since your last visit have you continued or begun to take your medications as prescribed? Yes   Since your last visit have you had your blood pressure checked? Yes   Is your most recent blood pressure lower, unchanged, or higher since your last visit? Lower   Since your last visit are you checking your feet? Yes   How many days per week are you checking your feet? 7   Since your last visit have you experienced any weight changes? Loss   Weight Loss (lbs) 3   Since your last visit, are you checking your blood glucose at least once a day? Yes  until he had trouble with meter      Learning Objective:  Patient will have a greater understanding of diabetes self-management. Patient education plan is complete as of today. Patient to attend follow up in 1-3 months   Plan:   Patient Instructions  Your foot care doctor visit is: Wednesday September 14 at 2:30  pm with Dr. Leeanne Deed                                              [address], [place], [place] [address]                                              [phone]   Try to Eat mostly baked food  Baked potato or chili instead of fries Good job Cutting back on alcohol and cigarettes!!!  Please make an appointment to see me in about 4 weeks.     Expected Outcomes:  Demonstrated interest in learning. Expect positive outcomes Education material provided: AVS If problems or questions, patient to contact team via:  Phone Future DSME appointment: - 2 months

## 2015-08-11 ENCOUNTER — Ambulatory Visit: Payer: Medicaid Other | Admitting: Podiatry

## 2015-08-11 NOTE — Addendum Note (Signed)
Addended by: Neomia Dear on: 08/11/2015 06:55 PM   Modules accepted: Orders

## 2015-08-16 NOTE — Progress Notes (Signed)
Cardiology Office Note   Date:  08/17/2015   ID:  Brady Church, DOB Jan 30, 1961, MRN [id number]  PCP:  Jill Alexanders, MD  Cardiologist:   Madilyn Hook, MD   Chief Complaint  Patient presents with  . New Evaluation    irregular heartbeat,high blood pressure, - started a new mdeications, no chest pain , no sob , no swelling      History of Present Illness: Brady Church is a 54 y.o. male with chronic systolic (EF 40-45%) and diastolic (grade 1) dysfunction, hypertension, hyperlipidemia, diabetes, tobacco and alcohol abuse who presents to establish care.  Brady Church was referred by his PCP after he was noted to have an ejection fraction of 40-45% on echo.  He reported occasional chest pain when smoking "off brand" cigarettes.  He denies exertional chest pain with exertion.  He reports walking constantly all day and is not limited by CP.  However, he does become SOB if he walks too quickly.  He denies LE edema, orthopnea, PND, nausea, vomiting or diaphoresis.  He does endorse mild dizziness upon awakening in the AM.    Brady Church has been slowly losing weight by changing his diet.  He avoids fried foods and limits his sweets.  He mostly drinks beer throughout the day (16 oz at 9am and 16oz at 2 pm).  In addition to that he drinks one water daily.  He has lost 5 lb in the last month.  He continues to smoke 5-7 cigarettes daily.    Past Medical History  Diagnosis Date  . Diabetes mellitus   . Hypertension   . Substance abuse     cocaine. Stopped in 2010  . High cholesterol   . Tobacco use disorder 10/07/2014  . Blind     blind in left eye, very limited vision right eye     Past Surgical History  Procedure Laterality Date  . Cataract extraction  01/08/2012    Right eye  . Eye surgery      x 4 total      Current Outpatient Prescriptions  Medication Sig Dispense Refill  . ACCU-CHEK FASTCLIX LANCETS MISC 1 each by Does not apply route 2 (two) times daily.  102 each 12  . aspirin EC 81 MG tablet Take 1 tablet (81 mg total) by mouth daily. 30 tablet 11  . carvedilol (COREG) 3.125 MG tablet TAKE ONE TABLET BY MOUTH TWICE DAILY WITH MEALS 60 tablet 3  . dorzolamide-timolol (COSOPT) 22.3-6.8 MG/ML ophthalmic solution Place 1 drop into the right eye 2 (two) times daily.  3  . DUREZOL 0.05 % EMUL Place 1 drop into both eyes 2 (two) times daily.   12  . glucose blood (ACCU-CHEK SMARTVIEW) test strip Use as instructed 100 each 12  . hydrochlorothiazide (HYDRODIURIL) 25 MG tablet Take 1 tablet (25 mg total) by mouth daily. 30 tablet 6  . lisinopril (PRINIVIL,ZESTRIL) 10 MG tablet Take 1 tablet (10 mg total) by mouth daily. 30 tablet 11  . lovastatin (MEVACOR) 40 MG tablet Take 1 tablet (40 mg total) by mouth daily. 30 tablet 11  . metFORMIN (GLUCOPHAGE) 1000 MG tablet Take 1 tablet (1,000 mg total) by mouth 2 (two) times daily with a meal. 60 tablet 3  . Potassium Chloride ER 20 MEQ TBCR Take 20 mEq by mouth daily. (Patient not taking: Reported on 03/09/2015) 30 tablet 3   No current facility-administered medications for this visit.    Allergies:   Review of patient's allergies  indicates no known allergies.    Social History:  The patient  reports that he has been smoking Cigarettes.  He has been smoking about 0.20 packs per day. He does not have any smokeless tobacco history on file. He reports that he drinks alcohol. He reports that he does not use illicit drugs.   Family History:  The patient's family history includes Cancer (age of onset: 20) in his father; Coronary artery disease (age of onset: 40) in his mother; Diabetes in his maternal grandmother; Hypertension in his father.    ROS:  Please see the history of present illness.   Otherwise, review of systems are positive for none.   All other systems are reviewed and negative.    PHYSICAL EXAM: VS:  BP 126/90 mmHg  Pulse 79  Ht  (1.727 m)  Wt 108.682 kg (239 lb 9.6 oz)  BMI 36.44 kg/m2 ,  BMI Body mass index is 36.44 kg/(m^2). GENERAL:  Well appearing HEENT:  Pupils equal round and reactive, fundi not visualized, oral mucosa unremarkable NECK:  No jugular venous distention, waveform within normal limits, carotid upstroke brisk and symmetric, no bruits, no thyromegaly LYMPHATICS:  No cervical adenopathy LUNGS:  Clear to auscultation bilaterally HEART:  RRR.  PMI not displaced or sustained,S1 and S2 within normal limits, no S3, no S4, no clicks, no rubs, no murmurs ABD:  Flat, positive bowel sounds normal in frequency in pitch, no bruits, no rebound, no guarding, no midline pulsatile mass, no hepatomegaly, no splenomegaly EXT:  2 plus pulses throughout, no edema, no cyanosis no clubbing SKIN:  No rashes no nodules NEURO:  Cranial nerves II through XII grossly intact, motor grossly intact throughout PSYCH:  Cognitively intact, oriented to person place and time    EKG:  EKG is ordered today. The ekg ordered today demonstrates sinus rhythm at 79 bpm.  L axis deviation.     Recent Labs: 04/19/2015: ALT 23; Magnesium 2.0 05/24/2015: BUN 7; Creatinine, Ser 0.84; Hemoglobin 14.6; Platelets 206; Potassium 3.6; Sodium 133*    Lipid Panel    Component Value Date/Time   CHOL 201* 01/07/2015 1039   TRIG 234* 01/07/2015 1039   HDL 47 01/07/2015 1039   CHOLHDL 4.3 01/07/2015 1039   VLDL 47* 01/07/2015 1039   LDLCALC 107* 01/07/2015 1039      Wt Readings from Last 3 Encounters:  08/17/15 108.682 kg (239 lb 9.6 oz)  08/06/15 109.498 kg (241 lb 6.4 oz)  07/21/15 110.224 kg (243 lb)    Echo 01/12/15: Study Conclusions  - Left ventricle: The cavity size was normal. There was mild concentric hypertrophy. Systolic function was mildly to moderately reduced. The estimated ejection fraction was in the range of 40% to 45%. There is hypokinesis of the inferolateral myocardium. Doppler parameters are consistent with abnormal left ventricular relaxation (grade 1 diastolic  dysfunction).   ASSESSMENT AND PLAN:  # Chronic systolic heart failure: Brady Church was noted to have a reduced LVEF on echo.  He does not appear to have symptomatic heart failure.  He is euvolemic on exam.  The etiology of his heart failure may be due to alcohol.  I have advised him to quit or at least significantly reduce his alcohol intake.  We will also obtain a Lexiscan Myoview in order to assess for ischemia.  He has sharp chest pain with smoking that is more likely related to lung disease than CAD.  However, he has multiple CAD risk factors.  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 - Continue carvedilol and lisinopril - Lexiscan Myoview  # Hypertension: BP well-controlled.  Ideally, would titrate down his HCTZ and increase the carvedilol or lisinopril given his heart failure.  However, he is well-controlled today, so we will not make any changes.  # Hyperlipidemia: On lovastatin.  LDL 107 12/2014. Managed by PCP.  Will await results of stress prior to considering changes.  Current medicines are reviewed at length with the patient today.  The patient does not have concerns regarding medicines.  The following changes have been made:  no change  Labs/ tests ordered today include:   Orders Placed This Encounter  Procedures  . Myocardial Perfusion Imaging  . EKG 12-Lead     Disposition:   FU with Dr. Elmarie Shiley C. Fosston in 6 months   Signed, Madilyn Hook, MD  08/17/2015 9:22 AM    [facility]

## 2015-08-17 ENCOUNTER — Ambulatory Visit (INDEPENDENT_AMBULATORY_CARE_PROVIDER_SITE_OTHER): Payer: Medicaid Other | Admitting: Cardiovascular Disease

## 2015-08-17 ENCOUNTER — Encounter: Payer: Self-pay | Admitting: Cardiovascular Disease

## 2015-08-17 VITALS — BP 126/90 | HR 79 | Ht 68.0 in | Wt 239.6 lb

## 2015-08-17 DIAGNOSIS — I499 Cardiac arrhythmia, unspecified: Secondary | ICD-10-CM

## 2015-08-17 DIAGNOSIS — I5020 Unspecified systolic (congestive) heart failure: Secondary | ICD-10-CM

## 2015-08-17 DIAGNOSIS — I10 Essential (primary) hypertension: Secondary | ICD-10-CM

## 2015-08-17 NOTE — Patient Instructions (Signed)
Your physician has requested that you have a lexiscan myoview. For further information please visit [email or web address] Please follow instruction sheet, as given.  NO CHANGE WITH CURRENT MEDICATIONS  Your physician recommends that you schedule a follow-up appointment in 6 MONTH WITH DR [facility]

## 2015-08-18 ENCOUNTER — Telehealth: Payer: Self-pay | Admitting: Dietician

## 2015-08-18 ENCOUNTER — Encounter: Payer: Self-pay | Admitting: Dietician

## 2015-08-18 NOTE — Telephone Encounter (Signed)
Patient called and said he ran out of strips and cannot check his blood sugar. Call him at [phone]. Patient needs to get refill at pharmacy. Unable to reach him by phone

## 2015-08-20 ENCOUNTER — Telehealth (HOSPITAL_COMMUNITY): Payer: Self-pay

## 2015-08-20 NOTE — Telephone Encounter (Signed)
Encounter complete. 

## 2015-08-20 NOTE — Telephone Encounter (Signed)
Patient informed to go to the drug store for testing supplies

## 2015-08-23 ENCOUNTER — Telehealth: Payer: Self-pay | Admitting: Dietician

## 2015-08-23 NOTE — Telephone Encounter (Signed)
Left him a message to call the [employer] company directly using the phone number that is printed on the back of his meter.

## 2015-08-24 ENCOUNTER — Ambulatory Visit (INDEPENDENT_AMBULATORY_CARE_PROVIDER_SITE_OTHER): Payer: Medicaid Other | Admitting: Podiatry

## 2015-08-24 ENCOUNTER — Encounter: Payer: Self-pay | Admitting: Podiatry

## 2015-08-24 VITALS — BP 120/79 | HR 86 | Resp 12

## 2015-08-24 DIAGNOSIS — E119 Type 2 diabetes mellitus without complications: Secondary | ICD-10-CM

## 2015-08-24 DIAGNOSIS — M79676 Pain in unspecified toe(s): Secondary | ICD-10-CM

## 2015-08-24 DIAGNOSIS — B351 Tinea unguium: Secondary | ICD-10-CM | POA: Diagnosis not present

## 2015-08-24 NOTE — Patient Instructions (Signed)
Today year diabetic foot examination was satisfactory including up ulceration inability to feel. Today I debrided the elongated thick toenails and recommend the return at approximately three-month intervals for nail debridement  Diabetes and Foot Care Diabetes may cause you to have problems because of poor blood supply (circulation) to your feet and legs. This may cause the skin on your feet to become thinner, break easier, and heal more slowly. Your skin may become dry, and the skin may peel and crack. You may also have nerve damage in your legs and feet causing decreased feeling in them. You may not notice minor injuries to your feet that could lead to infections or more serious problems. Taking care of your feet is one of the most important things you can do for yourself.  HOME CARE INSTRUCTIONS  Wear shoes at all times, even in the house. Do not go barefoot. Bare feet are easily injured.  Check your feet daily for blisters, cuts, and redness. If you cannot see the bottom of your feet, use a mirror or ask someone for help.  Wash your feet with warm water (do not use hot water) and mild soap. Then pat your feet and the areas between your toes until they are completely dry. Do not soak your feet as this can dry your skin.  Apply a moisturizing lotion or petroleum jelly (that does not contain alcohol and is unscented) to the skin on your feet and to dry, brittle toenails. Do not apply lotion between your toes.  Trim your toenails straight across. Do not dig under them or around the cuticle. File the edges of your nails with an emery board or nail file.  Do not cut corns or calluses or try to remove them with medicine.  Wear clean socks or stockings every day. Make sure they are not too tight. Do not wear knee-high stockings since they may decrease blood flow to your legs.  Wear shoes that fit properly and have enough cushioning. To break in new shoes, wear them for just a few hours a day. This  prevents you from injuring your feet. Always look in your shoes before you put them on to be sure there are no objects inside.  Do not cross your legs. This may decrease the blood flow to your feet.  If you find a minor scrape, cut, or break in the skin on your feet, keep it and the skin around it clean and dry. These areas may be cleansed with mild soap and water. Do not cleanse the area with peroxide, alcohol, or iodine.  When you remove an adhesive bandage, be sure not to damage the skin around it.  If you have a wound, look at it several times a day to make sure it is healing.  Do not use heating pads or hot water bottles. They may burn your skin. If you have lost feeling in your feet or legs, you may not know it is happening until it is too late.  Make sure your health care Brady Church performs a complete foot exam at least annually or more often if you have foot problems. Report any cuts, sores, or bruises to your health care Brady Church immediately. SEEK MEDICAL CARE IF:   You have an injury that is not healing.  You have cuts or breaks in the skin.  You have an ingrown nail.  You notice redness on your legs or feet.  You feel burning or tingling in your legs or feet.  You have  pain or cramps in your legs and feet.  Your legs or feet are numb.  Your feet always feel cold. SEEK IMMEDIATE MEDICAL CARE IF:   There is increasing redness, swelling, or pain in or around a wound.  There is a red line that goes up your leg.  Pus is coming from a wound.  You develop a fever or as directed by your health care Brady Church.  You notice a bad smell coming from an ulcer or wound. Document Released: 11/10/2000 Document Revised: 07/16/2013 Document Reviewed: 04/22/2013 [facility] Patient Information 2015 [place], [place]. This information is not intended to replace advice given to you by your health care Brady Church. Make sure you discuss any questions you have with your health care Brady Church.

## 2015-08-24 NOTE — Progress Notes (Signed)
   Subjective:    Patient ID: Brady Church, male    DOB: 12/18/1960, 54 y.o.   MRN: [id number]  HPI    This patient presents today complaining of sore toenails on the right and left feet when walking wearing shoes. Gradually becoming more uncomfortable. In the past he said a friend who was able to trim the toenails, however, now patient has visual impairment is not able to do this himself and his friend is not able to trim the nails. Patient denies any history of claudication, amputation or foot ulcerations  Patient also requests a general diabetic foot examination today and recalls his last podiatric examination approximately 10 years prior  Review of Systems  HENT: Positive for hearing loss.   Eyes: Positive for pain and visual disturbance.  Respiratory: Positive for shortness of breath.        Objective:   Physical Exam  Orientated 3  Vascular: No peripheral edema noted bilaterally DP and PT pulses 2/4 bilaterally Capillary reflex immediate bilaterally  Neurological: Sensation to 10 g monofilament wire intact 5/5 bilaterally Vibratory sensation reactive bilaterally Ankle reflex equal and reactive bilaterally  Dermatological: Texture and turgor within normal limits bilaterally No skin lesions noted bilaterally The toenails are incurvated, elongated, discolored with texture and color changes in palpable tenderness 6-10  Musculoskeletal: Patient walks slowly with assistance of cane for visual impairment Hammertoe deformity second bilaterally There is no restriction ankle, subtalar, midtarsal joints bilaterally       Assessment & Plan:   Assessment: Satisfactory neurovascular status Diabetic without complications Symptomatic onychomycoses 6-10  Plan: Today review the results examination with patient today. The nails were debrided mechanically and electrically without any bleeding  Reappoint 3 month

## 2015-08-25 ENCOUNTER — Telehealth: Payer: Self-pay | Admitting: Dietician

## 2015-08-25 ENCOUNTER — Ambulatory Visit (HOSPITAL_COMMUNITY)
Admission: RE | Admit: 2015-08-25 | Discharge: 2015-08-25 | Disposition: A | Discharge status: Home / Self Care | Payer: Medicaid Other | Source: Ambulatory Visit | Attending: Cardiovascular Disease | Admitting: Cardiovascular Disease

## 2015-08-25 DIAGNOSIS — I499 Cardiac arrhythmia, unspecified: Secondary | ICD-10-CM | POA: Diagnosis not present

## 2015-08-25 DIAGNOSIS — R079 Chest pain, unspecified: Secondary | ICD-10-CM | POA: Diagnosis not present

## 2015-08-25 DIAGNOSIS — E669 Obesity, unspecified: Secondary | ICD-10-CM | POA: Diagnosis not present

## 2015-08-25 DIAGNOSIS — Z6836 Body mass index (BMI) 36.0-36.9, adult: Secondary | ICD-10-CM | POA: Insufficient documentation

## 2015-08-25 DIAGNOSIS — R42 Dizziness and giddiness: Secondary | ICD-10-CM | POA: Insufficient documentation

## 2015-08-25 DIAGNOSIS — Z8249 Family history of ischemic heart disease and other diseases of the circulatory system: Secondary | ICD-10-CM | POA: Insufficient documentation

## 2015-08-25 DIAGNOSIS — E119 Type 2 diabetes mellitus without complications: Secondary | ICD-10-CM | POA: Diagnosis not present

## 2015-08-25 DIAGNOSIS — I5020 Unspecified systolic (congestive) heart failure: Secondary | ICD-10-CM | POA: Diagnosis not present

## 2015-08-25 DIAGNOSIS — F17200 Nicotine dependence, unspecified, uncomplicated: Secondary | ICD-10-CM | POA: Diagnosis not present

## 2015-08-25 DIAGNOSIS — R0609 Other forms of dyspnea: Secondary | ICD-10-CM | POA: Insufficient documentation

## 2015-08-25 DIAGNOSIS — I517 Cardiomegaly: Secondary | ICD-10-CM | POA: Insufficient documentation

## 2015-08-25 LAB — MYOCARDIAL PERFUSION IMAGING
CHL CUP NUCLEAR SDS: 0
CHL CUP NUCLEAR SRS: 1
CHL CUP RESTING HR STRESS: 66 {beats}/min
LV sys vol: 108 mL
LVDIAVOL: 179 mL
NUC STRESS TID: 1.14
Peak HR: 86 {beats}/min
SSS: 1

## 2015-08-25 MED ORDER — REGADENOSON 0.4 MG/5ML IV SOLN
0.4000 mg | Freq: Once | INTRAVENOUS | Status: AC
  Administered 2015-08-25: 0.4 mg via INTRAVENOUS

## 2015-08-25 MED ORDER — TECHNETIUM TC 99M SESTAMIBI GENERIC - CARDIOLITE
10.8000 | Freq: Once | INTRAVENOUS | Status: AC | PRN
  Administered 2015-08-25: 10.8 via INTRAVENOUS

## 2015-08-25 MED ORDER — TECHNETIUM TC 99M SESTAMIBI GENERIC - CARDIOLITE
31.2000 | Freq: Once | INTRAVENOUS | Status: AC | PRN
  Administered 2015-08-25: 31.2 via INTRAVENOUS

## 2015-08-25 NOTE — Telephone Encounter (Signed)
Talked to patient about calling [facility] as well as getting a refill from [facility] [address]. He verbalized understanding and agreed to contact his pharmacy.  he reports his feet are in good shape per his podiatrist and he tries to check them daily to the best of his ability.  Walks daily

## 2015-08-31 ENCOUNTER — Telehealth: Payer: Self-pay | Admitting: *Deleted

## 2015-08-31 NOTE — Telephone Encounter (Signed)
LEFT MESSAGE TO CALL BACK

## 2015-08-31 NOTE — Telephone Encounter (Signed)
-----   Message from Chilton Si, MD sent at 08/29/2015  6:49 PM EDT ----- Stress test showed heart failure, as was seen on his echo.  He does not have any ischemia, or areas of the heart that don't get adequate blood flow.  Continue current management and there is no need for a heart cathterization.

## 2015-09-06 NOTE — Telephone Encounter (Signed)
LEFT MESSAGE TO CALL BACK

## 2015-09-08 ENCOUNTER — Other Ambulatory Visit: Payer: Self-pay | Admitting: Internal Medicine

## 2015-09-08 NOTE — Telephone Encounter (Signed)
Returning your call , please call   Thanks

## 2015-09-09 NOTE — Telephone Encounter (Signed)
Left message to call back  

## 2015-09-10 NOTE — Telephone Encounter (Signed)
Returning your call, °

## 2015-09-11 DIAGNOSIS — R739 Hyperglycemia, unspecified: Secondary | ICD-10-CM

## 2015-09-11 NOTE — Congregational Nurse Program (Signed)
Saw client at the shelter clinic.  BP and CBG check.  B/P  160/98 and CBG is 111.  Client states he is out of his blood pressure med, but going to get the refill today.

## 2015-09-11 NOTE — Congregational Nurse Program (Signed)
Seen in the shelter clinic on 08/31/15 for  B/P check and CBG.  States is feeling "good".  B/P is 142/86 and CBG is 145 (last eaten about 11 am).  Discussed smoking and its impact on blood pressure.  States he wants to try to stop smoking.  Discussed starting with reducing number of cigarettes smoked daily.

## 2015-09-16 ENCOUNTER — Telehealth: Payer: Self-pay | Admitting: Internal Medicine

## 2015-09-16 NOTE — Telephone Encounter (Signed)
Called Dr. Laruth Bouchard office.  LOV was 06/04/2015.  Pt to RTN in 3 months.  OV notes being faxed.

## 2015-09-16 NOTE — Telephone Encounter (Signed)
Left message to cal back 

## 2015-09-17 ENCOUNTER — Encounter: Payer: Self-pay | Admitting: *Deleted

## 2015-09-17 NOTE — Telephone Encounter (Signed)
Mailed results letter.

## 2015-10-05 DIAGNOSIS — Z139 Encounter for screening, unspecified: Secondary | ICD-10-CM

## 2015-10-07 DIAGNOSIS — Z139 Encounter for screening, unspecified: Secondary | ICD-10-CM

## 2015-10-07 LAB — GLUCOSE, POCT (MANUAL RESULT ENTRY)
POC GLUCOSE: 164 mg/dL — AB (ref 70–99)
POC Glucose: 122 mg/dl — AB (ref 70–99)

## 2015-10-07 NOTE — Congregational Nurse Program (Signed)
Congregational Nurse Program Note  Date of Encounter: 10/05/2015  Past Medical History: Past Medical History  Diagnosis Date  . Diabetes mellitus   . Hypertension   . Substance abuse     cocaine. Stopped in 2010  . High cholesterol   . Tobacco use disorder 10/07/2014  . Blind     blind in left eye, very limited vision right eye     Encounter Details:     CNP Questionnaire - 10/07/15 1509    Patient Demographics   Is this a new or existing patient? Existing   Patient is considered a/an Not Applicable   Patient Assistance   Patient's financial/insurance status Medicaid;Low Income   Patient referred to apply for the following financial assistance Not Applicable   Food insecurities addressed Provided food supplies   Transportation assistance No   Assistance securing medications No   Educational health offerings Not Applicable   Encounter Details   Primary purpose of visit Chronic Illness/Condition Visit   Was an Emergency Department visit averted? Not Applicable   Does patient have a medical provider? Yes   Patient referred to Not Applicable   Was a mental health screening completed? (GAINS tool) No   Does patient have dental issues? No   Since previous encounter, have you referred patient for abnormal blood pressure that resulted in a new diagnosis or medication change? No   Since previous encounter, have you referred patient for abnormal blood glucose that resulted in a new diagnosis or medication change? No      Clinic visit for B/P check and CBG.

## 2015-10-07 NOTE — Congregational Nurse Program (Signed)
Congregational Nurse Program Note  Date of Encounter: 10/07/2015  Past Medical History: Past Medical History  Diagnosis Date  . Diabetes mellitus   . Hypertension   . Substance abuse     cocaine. Stopped in 2010  . High cholesterol   . Tobacco use disorder 10/07/2014  . Blind     blind in left eye, very limited vision right eye     Encounter Details:     CNP Questionnaire - 10/07/15 1515    Patient Demographics   Is this a new or existing patient? Existing   Patient is considered a/an Not Applicable   Patient Assistance   Patient's financial/insurance status Medicaid;Low Income   Patient referred to apply for the following financial assistance Not Applicable   Food insecurities addressed Provided food supplies   Transportation assistance No   Assistance securing medications No   Educational health offerings Medications   Encounter Details   Primary purpose of visit Education/Health Concerns;Spiritual Care/Support Visit   Was an Emergency Department visit averted? Not Applicable   Does patient have a medical provider? Yes   Patient referred to Not Applicable   Was a mental health screening completed? (GAINS tool) No   Does patient have dental issues? No   Since previous encounter, have you referred patient for abnormal blood pressure that resulted in a new diagnosis or medication change? No   Since previous encounter, have you referred patient for abnormal blood glucose that resulted in a new diagnosis or medication change? No       Clinic visit to f/u with elevated B/P from 11/3.  Significantly improved.

## 2015-10-07 NOTE — Patient Instructions (Signed)
Instructed client to return to clinic on Thursday (11/10) to recheck B/P

## 2015-10-12 DIAGNOSIS — R739 Hyperglycemia, unspecified: Secondary | ICD-10-CM

## 2015-10-12 DIAGNOSIS — Z139 Encounter for screening, unspecified: Secondary | ICD-10-CM

## 2015-10-14 DIAGNOSIS — Z139 Encounter for screening, unspecified: Secondary | ICD-10-CM

## 2015-10-14 DIAGNOSIS — R739 Hyperglycemia, unspecified: Secondary | ICD-10-CM

## 2015-10-26 DIAGNOSIS — Z139 Encounter for screening, unspecified: Secondary | ICD-10-CM

## 2015-10-26 DIAGNOSIS — R739 Hyperglycemia, unspecified: Secondary | ICD-10-CM

## 2015-10-26 LAB — GLUCOSE, POCT (MANUAL RESULT ENTRY)
POC GLUCOSE: 136 mg/dL — AB (ref 70–99)
POC GLUCOSE: 96 mg/dL (ref 70–99)

## 2015-10-26 NOTE — Congregational Nurse Program (Signed)
Congregational Nurse Program Note  Date of Encounter: 10/26/2015  Past Medical History: Past Medical History  Diagnosis Date  . Diabetes mellitus   . Hypertension   . Substance abuse     cocaine. Stopped in 2010  . High cholesterol   . Tobacco use disorder 10/07/2014  . 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 Existing   Patient is considered a/an Not Applicable   Patient Assistance   Patient's financial/insurance status Medicaid;Low Income   Patient referred to apply for the following financial assistance Not Applicable   Food insecurities addressed Provided food supplies   Transportation assistance No   Assistance securing medications No   Educational health offerings Not Applicable   Encounter Details   Primary purpose of visit Chronic Illness/Condition Visit   Was an Emergency Department visit averted? Not Applicable   Does patient have a medical provider? Yes   Patient referred to Not Applicable   Was a mental health screening completed? (GAINS tool) No   Does patient have dental issues? No   Since previous encounter, have you referred patient for abnormal blood pressure that resulted in a new diagnosis or medication change? No   Since previous encounter, have you referred patient for abnormal blood glucose that resulted in a new diagnosis or medication change? No   For Abstraction Use Only   Does patient have insurance? Yes    Clinic visit for B/P check and CBG.  States has not taken "B/P pill today".  Instructed patient to take his medication now.  B/P 160/116.  Instructed to return to clinic at 4:30 pm for a recheck of B/P.  CBG  96

## 2015-10-26 NOTE — Congregational Nurse Program (Signed)
Congregational Nurse Program Note  Date of Encounter: 10/12/2015  Past Medical History: Past Medical History  Diagnosis Date  . Diabetes mellitus   . Hypertension   . Substance abuse     cocaine. Stopped in 2010  . High cholesterol   . Tobacco use disorder 10/07/2014  . 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 Existing   Patient is considered a/an Not Applicable   Patient Assistance   Patient's financial/insurance status Medicaid;Low Income   Patient referred to apply for the following financial assistance Not Applicable   Food insecurities addressed Provided food supplies   Transportation assistance No   Assistance securing medications No   Educational health offerings Not Applicable   Encounter Details   Primary purpose of visit Education/Health Concerns;Chronic Illness/Condition Visit   Was an Emergency Department visit averted? Not Applicable   Does patient have a medical provider? Yes   Patient referred to Not Applicable   Was a mental health screening completed? (GAINS tool) No   Does patient have dental issues? No   Since previous encounter, have you referred patient for abnormal blood pressure that resulted in a new diagnosis or medication change? No   Since previous encounter, have you referred patient for abnormal blood glucose that resulted in a new diagnosis or medication change? No   For Abstraction Use Only   Does patient have insurance? Yes    Clinic visit.  States has not taken his lisinopril,  Plans on getting refilled today.  CBG  136  B/P 150/96

## 2015-10-28 ENCOUNTER — Telehealth: Payer: Self-pay

## 2015-10-28 DIAGNOSIS — R739 Hyperglycemia, unspecified: Secondary | ICD-10-CM

## 2015-10-28 DIAGNOSIS — Z139 Encounter for screening, unspecified: Secondary | ICD-10-CM

## 2015-10-28 LAB — GLUCOSE, POCT (MANUAL RESULT ENTRY): POC Glucose: 91 mg/dl (ref 70–99)

## 2015-10-28 NOTE — Telephone Encounter (Signed)
Only advice for now would be to take medications as prescribed and eat a healthy low-sodium diet. We will re-evaluate his blood pressure medication regimen on follow up in one week.   Thank you! Aribelle Mccosh

## 2015-10-28 NOTE — Congregational Nurse Program (Signed)
Congregational Nurse Program Note  Date of Encounter: 10/28/2015  Past Medical History: Past Medical History  Diagnosis Date  . Diabetes mellitus   . Hypertension   . Substance abuse     cocaine. Stopped in 2010  . High cholesterol   . Tobacco use disorder 10/07/2014  . 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 Existing   Patient is considered a/an Not Applicable   Patient Assistance   Patient's financial/insurance status Medicaid;Low Income   Patient referred to apply for the following financial assistance Not Applicable   Food insecurities addressed Provided food supplies   Transportation assistance No   Assistance securing medications No   Educational health offerings Not Applicable   Encounter Details   Primary purpose of visit Chronic Illness/Condition Visit   Was an Emergency Department visit averted? Not Applicable   Does patient have a medical provider? No   Patient referred to Doctor referral for a non-emergent behavioral health crisis   Was a mental health screening completed? (GAINS tool) No   Does patient have dental issues? No   Since previous encounter, have you referred patient for abnormal blood pressure that resulted in a new diagnosis or medication change? No   Since previous encounter, have you referred patient for abnormal blood glucose that resulted in a new diagnosis or medication change? No   For Abstraction Use Only   Does patient have insurance? Yes     Clinic visit for B/P check.  B/P 150/110.  States had taken all "my medications this morning".  Notified Dr. Berenda Morale office with [facility].  Left message.  Client returned to clinic at 4 pm and B/p was 140/80.  CBG at 9:30 am 91

## 2015-10-28 NOTE — Telephone Encounter (Signed)
Rec'd VM from [employer]	[place] nurse with [employer].  Claris Gower reporting that on visit 11/29 pt BP elevated to 160/116 and pt reported having not taken his medications that day.  She advised him to take them and during visit today pt reports compliance with all ordered meds yet BP 150/110.  Pt has an appointment on 12/7.  RN and pt wanting advice on measures to take for BP until apt, if any. Please advise.

## 2015-10-29 NOTE — Telephone Encounter (Signed)
Tried to call pt, number disconnected.  Was able to speak with Brady Church, she reports a reading of 140/80 at recheck yesterday afternoon.  I gave her instructions from PCP to pass to pt when she sees him again

## 2015-11-03 ENCOUNTER — Ambulatory Visit (INDEPENDENT_AMBULATORY_CARE_PROVIDER_SITE_OTHER): Payer: Medicaid Other | Admitting: Internal Medicine

## 2015-11-03 ENCOUNTER — Encounter: Payer: Self-pay | Admitting: Internal Medicine

## 2015-11-03 VITALS — BP 125/74 | HR 88 | Temp 98.6°F | Wt 246.7 lb

## 2015-11-03 DIAGNOSIS — I5042 Chronic combined systolic (congestive) and diastolic (congestive) heart failure: Secondary | ICD-10-CM | POA: Diagnosis not present

## 2015-11-03 DIAGNOSIS — E1136 Type 2 diabetes mellitus with diabetic cataract: Secondary | ICD-10-CM

## 2015-11-03 DIAGNOSIS — Z1211 Encounter for screening for malignant neoplasm of colon: Secondary | ICD-10-CM

## 2015-11-03 DIAGNOSIS — I10 Essential (primary) hypertension: Secondary | ICD-10-CM | POA: Diagnosis not present

## 2015-11-03 DIAGNOSIS — Z299 Encounter for prophylactic measures, unspecified: Secondary | ICD-10-CM

## 2015-11-03 LAB — POCT GLYCOSYLATED HEMOGLOBIN (HGB A1C): Hemoglobin A1C: 6.2

## 2015-11-03 LAB — GLUCOSE, CAPILLARY: GLUCOSE-CAPILLARY: 188 mg/dL — AB (ref 65–99)

## 2015-11-03 MED ORDER — LISINOPRIL 20 MG PO TABS: 20.0000 mg | ORAL_TABLET | Freq: Every day | ORAL | Status: DC

## 2015-11-03 MED ORDER — HYDROCHLOROTHIAZIDE 12.5 MG PO TABS: 12.5000 mg | ORAL_TABLET | Freq: Every day | ORAL | Status: DC

## 2015-11-03 MED ORDER — CARVEDILOL 6.25 MG PO TABS: 6.2500 mg | ORAL_TABLET | Freq: Two times a day (BID) | ORAL | Status: DC

## 2015-11-03 NOTE — Progress Notes (Signed)
Subjective:    Patient ID: Brady Church, male    DOB: 05-14-1961, 54 y.o.   MRN: [id number]  HPI Brady Church is a 54 y.o. male with PMHx of chronic systolic CHF, T2DM, HTN, HLD who presents to the clinic for follow up for HTN and T2DM. Please see A&P for the status of the patient's chronic medical problems.   Past Medical History  Diagnosis Date  . Diabetes mellitus   . Hypertension   . Substance abuse     cocaine. Stopped in 2010  . High cholesterol   . Tobacco use disorder 10/07/2014  . 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 ACCU-CHEK FASTCLIX LANCETS MISC 1 each by Does not apply route 2 (two) times daily.   Marland Kitchen aspirin EC 81 MG tablet Take 1 tablet (81 mg total) by mouth daily.   . carvedilol (COREG) 6.25 MG tablet Take 1 tablet (6.25 mg total) by mouth 2 (two) times daily with a meal.   . dorzolamide-timolol (COSOPT) 22.3-6.8 MG/ML ophthalmic solution Place 1 drop into the right eye 2 (two) times daily. 04/19/2015: .   Marland Kitchen DUREZOL 0.05 % EMUL Place 1 drop into both eyes 2 (two) times daily.  04/19/2015: .   . glucose blood (ACCU-CHEK SMARTVIEW) test strip Use as instructed   . hydrochlorothiazide (HYDRODIURIL) 12.5 MG tablet Take 1 tablet (12.5 mg total) by mouth daily.   Marland Kitchen lisinopril (PRINIVIL,ZESTRIL) 20 MG tablet Take 1 tablet (20 mg total) by mouth daily.   Marland Kitchen lovastatin (MEVACOR) 40 MG tablet Take 1 tablet (40 mg total) by mouth daily.   . metFORMIN (GLUCOPHAGE) 1000 MG tablet Take 1 tablet (1,000 mg total) by mouth 2 (two) times daily with a meal.   . [DISCONTINUED] carvedilol (COREG) 3.125 MG tablet TAKE ONE TABLET BY MOUTH TWICE DAILY WITH MEALS   . [DISCONTINUED] hydrochlorothiazide (HYDRODIURIL) 25 MG tablet TAKE ONE TABLET BY MOUTH ONCE DAILY   . [DISCONTINUED] lisinopril (PRINIVIL,ZESTRIL) 10 MG tablet Take 1 tablet (10 mg total) by mouth daily.   . [DISCONTINUED]  Potassium Chloride ER 20 MEQ TBCR Take 20 mEq by mouth daily.    No facility-administered encounter medications on file as of 11/03/2015.    Family History  Problem Relation Age of Onset  . Coronary artery disease Mother 54    died of MI  . Cancer Father 57    Some GI cancer. Not sure if it was Colon Cancer or not.  . Hypertension Father   . Diabetes Maternal Grandmother     Social History   Social History  . Marital Status: Single    Spouse Name: [name]  . Number of Children: [name]  . Years of Education: 12   Occupational History  . Not on file.   Social History Main Topics  . Smoking status: Current Every Day Smoker -- 0.20 packs/day    Types: Cigarettes    Last Attempt to Quit: 01/16/2006  . Smokeless tobacco: Not on file     Comment:  smokes about 4 a day.  . Alcohol Use: 0.0 oz/week    0 Standard drinks or equivalent per week     Comment: Beer- 16oz daily.  . Drug Use: No     Comment: Previous cocaine user. Stopped 2 years before.  Marland Kitchen Sexual Activity: Not on file   Other Topics Concern  . Not on  file   Social History Narrative   Lives in [place] for last 10 years with "friend"   Is not married and does not have kids.   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    Review of Systems General: Denies fatigue, weight gain Respiratory: Denies SOB, DOE, chest tightness Cardiovascular: Denies chest pain and palpitations.  Endocrine: Denies polyuria, and polydipsia. Neurological: Denies dizziness, headaches, weakness, lightheadedness     Objective:   Physical Exam Filed Vitals:   11/03/15 1403  BP: 125/74  Pulse: 88  Temp: 98.6 F (37 C)  TempSrc: Oral  Weight: 246 lb 11.2 oz (111.902 kg)  SpO2: 98%   General: Vital signs reviewed.  Patient is blind, in no acute distress and cooperative with exam.  Cardiovascular: RRR, S1 normal, S2 normal, no murmurs, gallops, or rubs. Pulmonary/Chest:  Clear to auscultation bilaterally, no wheezes, rales, or rhonchi. Abdominal: Soft, non-tender, non-distended, BS + Extremities: No lower extremity edema bilaterally Skin: Warm, dry and intact. Psychiatric: Normal mood and affect.     Assessment & Plan:   Please see problem based assessment and plan.

## 2015-11-03 NOTE — Patient Instructions (Signed)
TAKE LISINOPRIL 20 MG (2 PILLS) ONCE A DAY UNTIL YOU RUN OUT. THEN PICK UP YOUR NEW PRESCRIPTION WHICH IS 1 PILL ONCE A DAY.  TAKE CARVEDILOL 6.25 MG (2 PILLS) TWICE A DAY UNTIL YOU RUN OUT. THEN PICK UP YOUR NEW PRESCRIPTION WHICH WILL BE 1 PILL TWICE A DAY.  DECREASED HYDROCHLOROTHIAZIDE TO 12.5 MG (1/2 PILL) ONCE A DAY UNTIL YOU RUN OUT. OR GO PICK UP YOUR NEW PRESCRIPTION WHICH IS ONE PILL ONCE A DAY.

## 2015-11-04 DIAGNOSIS — Z139 Encounter for screening, unspecified: Secondary | ICD-10-CM

## 2015-11-04 DIAGNOSIS — R739 Hyperglycemia, unspecified: Secondary | ICD-10-CM

## 2015-11-04 LAB — GLUCOSE, POCT (MANUAL RESULT ENTRY): POC GLUCOSE: 174 mg/dL — AB (ref 70–99)

## 2015-11-04 NOTE — Assessment & Plan Note (Signed)
Patient is euvolemic on examination without complaints of shortness of breath, weight gain or DOE. He has no JVD, crackles or lower extremity edema on examination. To optimize his medication regimen, we will increase lisinopril and coreg and decrease HCTZ as patient is normotensive. Patient was recently evaluated for stable angina, that has since resolved. A myoview was performed which was intermediate risk, no areas of ischemia were seen, EF was stable at 40-45%. It was felt that a heart catheterization was not needed. Patient has several risk factors for CAD given male gender, T2DM, and tobacco abuse. Given patient is at an intermediate risk and with an intermediate study, I feel a heart catheterization may be beneficial in the near future for this patient. I will forward my note to the provider and have him follow up with Cardiology.   Plan: -Follow up with Cardiology -Consider heart cath to evaluate for CAD given intermediate risk study  -Continue ASA 81 mg daily -Continue Lovastatin 40 mg daily -Increase to Lisinopril 20 mg daily -Increase to Coreg 6.25 mg BID -Decrease to HCTZ 12.5 mg daily

## 2015-11-04 NOTE — Assessment & Plan Note (Signed)
PSA level was normal at 0.8.  Will refer today for screening colonoscopy.

## 2015-11-04 NOTE — Assessment & Plan Note (Signed)
Lab Results  Component Value Date   HGBA1C 6.2 11/03/2015   HGBA1C 8.3 07/21/2015   HGBA1C 10.6* 04/19/2015     Assessment: Diabetes control:  Controlled Progress toward A1C goal:   At goal Comments: Compliant with Metformin 1000 mg BID  Plan: Medications:  continue current medications Home glucose monitoring: Frequency:  QD Timing:  QAM Instruction/counseling given: reminded to bring blood glucose meter & log to each visit, discussed foot care and discussed the need for weight loss Other plans: Follow up in 3 months

## 2015-11-04 NOTE — Assessment & Plan Note (Signed)
BP Readings from Last 3 Encounters:  11/04/15 120/80  11/03/15 125/74  10/28/15 140/80    Lab Results  Component Value Date   NA 133* 05/24/2015   K 3.6 05/24/2015   CREATININE 0.84 05/24/2015    Assessment: Blood pressure control:  Controlled Progress toward BP goal:   At goal Comments: Compliant with HCTZ 25 mg daily, Coreg 3.125 mg BID, and Lisinopril 10 mg daily. Given newly diagnosed chronic systolic CHF, we will adjust medications to optimize his regimen for CHF and for T2DM.  Plan: Medications:  Increase Lisinopril to 20 mg daily, increase Coreg to 6.25 mg BID, and decrease HCTZ to 12.5 mg daily. Other plans: Continue having BP checked by congregational nurse

## 2015-11-04 NOTE — Congregational Nurse Program (Signed)
Congregational Nurse Program Note  Date of Encounter: 11/04/2015  Past Medical History: Past Medical History  Diagnosis Date  . Diabetes mellitus   . Hypertension   . Substance abuse     cocaine. Stopped in 2010  . High cholesterol   . Tobacco use disorder 10/07/2014  . 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 Existing   Patient is considered a/an Not Applicable   Patient Assistance   Patient's financial/insurance status Low Income;Medicaid   Patient referred to apply for the following financial assistance Not Applicable   Food insecurities addressed Provided food supplies   Transportation assistance No   Assistance securing medications No   Educational health offerings Not Applicable   Encounter Details   Primary purpose of visit Chronic Illness/Condition Visit;Spiritual Care/Support Visit   Was an Emergency Department visit averted? Not Applicable   Does patient have a medical provider? Yes   Patient referred to Not Applicable   Was a mental health screening completed? (GAINS tool) No   Does patient have dental issues? No   Since previous encounter, have you referred patient for abnormal blood pressure that resulted in a new diagnosis or medication change? Yes   Since previous encounter, have you referred patient for abnormal blood glucose that resulted in a new diagnosis or medication change? No   For Abstraction Use Only   Does patient have insurance? Yes         Amb Nursing Assessment - 11/03/15 1410    Pre-visit preparation   Pre-visit preparation completed Yes   Pain Assessment   Pain Assessment No/denies pain   Nutrition Screen   BMI - recorded 37.51   Nutritional Status BMI > 30  Obese   Nutritional Risks None   Diabetes Yes   CBG done? Yes   CBG resulted in Enter/ Edit results? Yes   Did pt. bring in CBG monitor  from home? No   Functional Status   Activities of Daily Living Independent   Ambulation Independent with device- listed below   Home Assistive Devices/Equipment Cane   Medication Administration Independent   Home Management Independent   Risk/Barriers  Assessment   Barriers to Care Management & Learning Sensory defecits   Sensory deficits Vision   Abuse/Neglect Assessment   Do you feel unsafe in your current relationship? No   Do you feel physically threatened by others? No   Anyone hurting you at home, work, or school? No   Patient Literacy   How often do you need to have someone help you when you read instructions, pamphlets, or other written materials from your doctor or pharmacy? 3 - Sometimes   What is the last grade level you completed in school? 12th grade.   Web designer Needed? No   Comments   Information entered by : Sharene Skeans 11/03/15@ 1414PM      Clinic visit for B/P check.  120/80.  Discussed medication change from Dr. Visit of yesterday.  States will be leaving the shelter next week.  Encouraged to return to clinic for B/P check as desired

## 2015-11-05 NOTE — Progress Notes (Signed)
Internal Medicine Clinic Attending  Case discussed with Dr. Richardson soon after the resident saw the patient.  We reviewed the resident's history and exam and pertinent patient test results.  I agree with the assessment, diagnosis, and plan of care documented in the resident's note. 

## 2015-11-05 NOTE — Addendum Note (Signed)
Addended by: Erlinda Hong T on: 11/05/2015 01:49 PM   Modules accepted: Level of Service

## 2015-11-08 ENCOUNTER — Telehealth: Payer: Self-pay | Admitting: *Deleted

## 2015-11-08 NOTE — Telephone Encounter (Signed)
Unable to get in contact w/ pt d/t telephone # "not a working number". I have re-scheduled pt for screening colonoscopy @ [facility] on Jan 9th @ 10AM. He needs to keep this appt; he no-showed his first appt in July. I called Lattie Haw his congregational nurse; information given to her; stated she might see pt today. I also mailed appt to him.

## 2015-11-08 NOTE — Telephone Encounter (Signed)
Thank you Glenda... 

## 2015-11-09 DIAGNOSIS — Z139 Encounter for screening, unspecified: Secondary | ICD-10-CM

## 2015-11-09 DIAGNOSIS — R739 Hyperglycemia, unspecified: Secondary | ICD-10-CM

## 2015-11-10 ENCOUNTER — Ambulatory Visit (INDEPENDENT_AMBULATORY_CARE_PROVIDER_SITE_OTHER): Payer: Medicaid Other | Admitting: Podiatry

## 2015-11-10 ENCOUNTER — Encounter: Payer: Self-pay | Admitting: Podiatry

## 2015-11-10 DIAGNOSIS — B351 Tinea unguium: Secondary | ICD-10-CM | POA: Diagnosis not present

## 2015-11-10 DIAGNOSIS — M79676 Pain in unspecified toe(s): Secondary | ICD-10-CM

## 2015-11-10 NOTE — Patient Instructions (Signed)
Diabetes and Foot Care Diabetes may cause you to have problems because of poor blood supply (circulation) to your feet and legs. This may cause the skin on your feet to become thinner, break easier, and heal more slowly. Your skin may become dry, and the skin may peel and crack. You may also have nerve damage in your legs and feet causing decreased feeling in them. You may not notice minor injuries to your feet that could lead to infections or more serious problems. Taking care of your feet is one of the most important things you can do for yourself.  HOME CARE INSTRUCTIONS  Wear shoes at all times, even in the house. Do not go barefoot. Bare feet are easily injured.  Check your feet daily for blisters, cuts, and redness. If you cannot see the bottom of your feet, use a mirror or ask someone for help.  Wash your feet with warm water (do not use hot water) and mild soap. Then pat your feet and the areas between your toes until they are completely dry. Do not soak your feet as this can dry your skin.  Apply a moisturizing lotion or petroleum jelly (that does not contain alcohol and is unscented) to the skin on your feet and to dry, brittle toenails. Do not apply lotion between your toes.  Trim your toenails straight across. Do not dig under them or around the cuticle. File the edges of your nails with an emery board or nail file.  Do not cut corns or calluses or try to remove them with medicine.  Wear clean socks or stockings every day. Make sure they are not too tight. Do not wear knee-high stockings since they may decrease blood flow to your legs.  Wear shoes that fit properly and have enough cushioning. To break in new shoes, wear them for just a few hours a day. This prevents you from injuring your feet. Always look in your shoes before you put them on to be sure there are no objects inside.  Do not cross your legs. This may decrease the blood flow to your feet.  If you find a minor scrape,  cut, or break in the skin on your feet, keep it and the skin around it clean and dry. These areas may be cleansed with mild soap and water. Do not cleanse the area with peroxide, alcohol, or iodine.  When you remove an adhesive bandage, be sure not to damage the skin around it.  If you have a wound, look at it several times a day to make sure it is healing.  Do not use heating pads or hot water bottles. They may burn your skin. If you have lost feeling in your feet or legs, you may not know it is happening until it is too late.  Make sure your health care provider performs a complete foot exam at least annually or more often if you have foot problems. Report any cuts, sores, or bruises to your health care provider immediately. SEEK MEDICAL CARE IF:   You have an injury that is not healing.  You have cuts or breaks in the skin.  You have an ingrown nail.  You notice redness on your legs or feet.  You feel burning or tingling in your legs or feet.  You have pain or cramps in your legs and feet.  Your legs or feet are numb.  Your feet always feel cold. SEEK IMMEDIATE MEDICAL CARE IF:   There is increasing redness,   swelling, or pain in or around a wound.  There is a red line that goes up your leg.  Pus is coming from a wound.  You develop a fever or as directed by your health care provider.  You notice a bad smell coming from an ulcer or wound.   This information is not intended to replace advice given to you by your health care provider. Make sure you discuss any questions you have with your health care provider.   Document Released: 11/10/2000 Document Revised: 07/16/2013 Document Reviewed: 04/22/2013 Elsevier Interactive Patient Education 2016 [employer]  

## 2015-11-11 NOTE — Progress Notes (Signed)
Patient ID: Brady Church, male   DOB: 10/21/1961, 54 y.o.   MRN: [id number]  Subjective: Today this patient presents for a scheduled visit complaining of painful, elongated and thickened toenails and requests toenail debridement   Objective: No open skin lesions bilaterally The toenails are hypertrophic, elongated, discolored with texture and color changes in palpable tenderness in the toenails 6-10  Assessment: Symptomatic onychomycoses 6-10 Diabetic without complications  Plan: Debridement toenails 6-10 mechanical he an electrically without any bleeding  Reappoint 3 months           

## 2015-11-13 LAB — GLUCOSE, POCT (MANUAL RESULT ENTRY): POC GLUCOSE: 134 mg/dL — AB (ref 70–99)

## 2015-11-13 NOTE — Congregational Nurse Program (Signed)
Congregational Nurse Program Note  Date of Encounter: 11/09/2015  Past Medical History: Past Medical History  Diagnosis Date  . Diabetes mellitus   . Hypertension   . Substance abuse     cocaine. Stopped in 2010  . High cholesterol   . Tobacco use disorder 10/07/2014  . 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 Existing   Patient is considered a/an Not Applicable   Patient Assistance   Patient's financial/insurance status Medicaid;Low Income   Patient referred to apply for the following financial assistance Not Applicable   Food insecurities addressed Provided food supplies   Transportation assistance No   Assistance securing medications No   Educational health offerings Other (comment)  smoking cessasation    Encounter Details   Primary purpose of visit Education/Health Concerns;Spiritual Care/Support Visit   Was an Emergency Department visit averted? Not Applicable   Does patient have a medical provider? Yes   Patient referred to Not Applicable   Was a mental health screening completed? (GAINS tool) No   Does patient have dental issues? No   Since previous encounter, have you referred patient for abnormal blood pressure that resulted in a new diagnosis or medication change? No   Since previous encounter, have you referred patient for abnormal blood glucose that resulted in a new diagnosis or medication change? No   For Abstraction Use Only   Does patient have insurance? Yes         Amb Nursing Assessment - 11/03/15 1410    Pre-visit preparation   Pre-visit preparation completed Yes   Pain Assessment   Pain Assessment No/denies pain   Nutrition Screen   BMI - recorded 37.51   Nutritional Status BMI > 30  Obese   Nutritional Risks None   Diabetes Yes   CBG done? Yes   CBG resulted in Enter/ Edit results? Yes   Did pt.  bring in CBG monitor from home? No   Functional Status   Activities of Daily Living Independent   Ambulation Independent with device- listed below   Home Assistive Devices/Equipment Cane   Medication Administration Independent   Home Management Independent   Risk/Barriers  Assessment   Barriers to Care Management & Learning Sensory defecits   Sensory deficits Vision   Abuse/Neglect Assessment   Do you feel unsafe in your current relationship? No   Do you feel physically threatened by others? No   Anyone hurting you at home, work, or school? No   Patient Literacy   How often do you need to have someone help you when you read instructions, pamphlets, or other written materials from your doctor or pharmacy? 3 - Sometimes   What is the last grade level you completed in school? 12th grade.   Web designer Needed? No   Comments   Information entered by : Sharene Skeans 11/03/15@ 1414PM      Has been moved to another shelter, but will continue to see congregational nurse at the [facility].  Discussed smoking and his B/P.  Encouraged him to decrease the number of cigarettes smoked in a day

## 2015-11-23 ENCOUNTER — Ambulatory Visit: Payer: Medicaid Other | Admitting: Podiatry

## 2015-11-23 DIAGNOSIS — Z139 Encounter for screening, unspecified: Secondary | ICD-10-CM

## 2015-11-23 DIAGNOSIS — R739 Hyperglycemia, unspecified: Secondary | ICD-10-CM

## 2015-12-02 DIAGNOSIS — R739 Hyperglycemia, unspecified: Secondary | ICD-10-CM

## 2015-12-02 DIAGNOSIS — Z139 Encounter for screening, unspecified: Secondary | ICD-10-CM

## 2015-12-05 LAB — GLUCOSE, POCT (MANUAL RESULT ENTRY)
POC GLUCOSE: 148 mg/dL — AB (ref 70–99)
POC Glucose: 187 mg/dl — AB (ref 70–99)

## 2015-12-05 NOTE — Congregational Nurse Program (Signed)
Congregational Nurse Program Note  Date of Encounter: 11/23/2015  Past Medical History: Past Medical History  Diagnosis Date  . Diabetes mellitus   . Hypertension   . Substance abuse     cocaine. Stopped in 2010  . High cholesterol   . Tobacco use disorder 10/07/2014  . Blind     blind in left eye, very limited vision right eye     Encounter Details:     CNP Questionnaire - 11/23/15 1340    Patient Demographics   Is this a new or existing patient? Existing   Patient is considered a/an Not Applicable   Race African-American/Black   Patient Assistance   Location of Patient Assistance Not Applicable   Patient's financial/insurance status Medicaid;Low Income   Uninsured Patient No   Patient referred to apply for the following financial assistance Not Applicable   Food insecurities addressed Provided food supplies   Transportation assistance No   Assistance securing medications No   Educational health offerings Hypertension;Spiritual care  smoking cessasation    Encounter Details   Primary purpose of visit Education/Health Concerns;Spiritual Care/Support Visit   Was an Emergency Department visit averted? Not Applicable   Does patient have a medical provider? Yes   Patient referred to Not Applicable   Was a mental health screening completed? (GAINS tool) No   Does patient have dental issues? No   Since previous encounter, have you referred patient for abnormal blood pressure that resulted in a new diagnosis or medication change? No   Since previous encounter, have you referred patient for abnormal blood glucose that resulted in a new diagnosis or medication change? No   For Abstraction Use Only   Does patient have insurance? Yes       Clinic visit for B/P and CBG.  States is enjoying his new living arrangements but eager for his own apartment.  He has been approved, waiting for opening

## 2015-12-05 NOTE — Congregational Nurse Program (Signed)
Congregational Nurse Program Note  Date of Encounter: 12/02/2015  Past Medical History: Past Medical History  Diagnosis Date  . Diabetes mellitus   . Hypertension   . Substance abuse     cocaine. Stopped in 2010  . High cholesterol   . Tobacco use disorder 10/07/2014  . Blind     blind in left eye, very limited vision right eye     Encounter Details:     CNP Questionnaire - 12/02/15 1438    Patient Demographics   Is this a new or existing patient? Existing   Patient is considered a/an Not Applicable   Race African-American/Black   Patient Assistance   Location of Patient Assistance Not Applicable   Patient's financial/insurance status Medicaid;Low Income   Uninsured Patient No   Patient referred to apply for the following financial assistance Not Applicable   Food insecurities addressed Provided food supplies   Transportation assistance No   Assistance securing medications No   Educational health offerings Hypertension;Spiritual care  smoking cessasation    Encounter Details   Primary purpose of visit Education/Health Concerns;Spiritual Care/Support Visit   Was an Emergency Department visit averted? Not Applicable   Does patient have a medical provider? Yes   Patient referred to Not Applicable   Was a mental health screening completed? (GAINS tool) No   Does patient have dental issues? No   Since previous encounter, have you referred patient for abnormal blood pressure that resulted in a new diagnosis or medication change? No   Since previous encounter, have you referred patient for abnormal blood glucose that resulted in a new diagnosis or medication change? No   For Abstraction Use Only   Does patient have insurance? Yes       Clinic visit for B/P check and CBG.  B/P elevated today.  States just smoked a cigarette.  Feeling more anxious about living arrangements.  Eager to get into his own apartment.

## 2015-12-07 DIAGNOSIS — R739 Hyperglycemia, unspecified: Secondary | ICD-10-CM

## 2015-12-07 DIAGNOSIS — Z139 Encounter for screening, unspecified: Secondary | ICD-10-CM

## 2015-12-07 LAB — GLUCOSE, POCT (MANUAL RESULT ENTRY): POC Glucose: 148 mg/dl — AB (ref 70–99)

## 2015-12-07 NOTE — Congregational Nurse Program (Signed)
Congregational Nurse Program Note  Date of Encounter: 12/07/2015  Past Medical History: Past Medical History  Diagnosis Date  . Diabetes mellitus   . Hypertension   . Substance abuse     cocaine. Stopped in 2010  . High cholesterol   . Tobacco use disorder 10/07/2014  . Blind     blind in left eye, very limited vision right eye     Encounter Details:     CNP Questionnaire - 12/07/15 1505    Patient Demographics   Is this a new or existing patient? Existing   Patient is considered a/an Not Applicable   Race African-American/Black   Patient Assistance   Location of Patient Assistance Not Applicable   Patient's financial/insurance status Low Income;Medicaid   Patient referred to apply for the following financial assistance Not Applicable   Food insecurities addressed Provided food supplies   Transportation assistance No   Assistance securing medications No   Educational health offerings Not Applicable   Encounter Details   Primary purpose of visit Chronic Illness/Condition Visit;Education/Health Concerns;Spiritual Care/Support Visit   Was an Emergency Department visit averted? Not Applicable   Does patient have a medical provider? Yes   Patient referred to Not Applicable   Was a mental health screening completed? (GAINS tool) No   Does patient have dental issues? No   Since previous encounter, have you referred patient for abnormal blood pressure that resulted in a new diagnosis or medication change? No   Since previous encounter, have you referred patient for abnormal blood glucose that resulted in a new diagnosis or medication change? No   For Abstraction Use Only   Does patient have insurance? Yes       B/P check and CBG.  States is doing well.  Will be in own apartment any day!  Very excited.

## 2015-12-09 DIAGNOSIS — Z139 Encounter for screening, unspecified: Secondary | ICD-10-CM

## 2015-12-09 DIAGNOSIS — R739 Hyperglycemia, unspecified: Secondary | ICD-10-CM

## 2015-12-24 LAB — GLUCOSE, POCT (MANUAL RESULT ENTRY): POC GLUCOSE: 203 mg/dL — AB (ref 70–99)

## 2015-12-24 NOTE — Congregational Nurse Program (Signed)
Congregational Nurse Program Note  Date of Encounter: 12/09/2015  Past Medical History: Past Medical History  Diagnosis Date  . Diabetes mellitus   . Hypertension   . Substance abuse     cocaine. Stopped in 2010  . High cholesterol   . Tobacco use disorder 10/07/2014  . Blind     blind in left eye, very limited vision right eye     Encounter Details:     CNP Questionnaire - 12/09/15 1243    Patient Demographics   Is this a new or existing patient? Existing   Patient is considered a/an Not Applicable   Race African-American/Black   Patient Assistance   Location of Patient Assistance Not Applicable   Patient's financial/insurance status Low Income;Medicaid   Uninsured Patient No   Patient referred to apply for the following financial assistance Not Applicable   Food insecurities addressed Provided food supplies   Transportation assistance No   Assistance securing medications No   Educational health offerings Not Applicable   Encounter Details   Primary purpose of visit Chronic Illness/Condition Visit;Education/Health Concerns;Spiritual Care/Support Visit   Was an Emergency Department visit averted? Not Applicable   Does patient have a medical provider? Yes   Patient referred to Not Applicable   Was a mental health screening completed? (GAINS tool) No   Does patient have dental issues? No   Since previous encounter, have you referred patient for abnormal blood pressure that resulted in a new diagnosis or medication change? No   Since previous encounter, have you referred patient for abnormal blood glucose that resulted in a new diagnosis or medication change? No   For Abstraction Use Only   Does patient have insurance? Yes     B/P and Blood sugar checked.  Expressed excitement that he has been approved for an apartment.  Is on a waiting list.

## 2015-12-28 DIAGNOSIS — R739 Hyperglycemia, unspecified: Secondary | ICD-10-CM

## 2015-12-28 DIAGNOSIS — Z139 Encounter for screening, unspecified: Secondary | ICD-10-CM

## 2015-12-29 LAB — GLUCOSE, POCT (MANUAL RESULT ENTRY): POC GLUCOSE: 119 mg/dL — AB (ref 70–99)

## 2015-12-29 NOTE — Congregational Nurse Program (Signed)
Congregational Nurse Program Note  Date of Encounter: 12/28/2015  Past Medical History: Past Medical History  Diagnosis Date  . Diabetes mellitus   . Hypertension   . Substance abuse     cocaine. Stopped in 2010  . High cholesterol   . Tobacco use disorder 10/07/2014  . Blind     blind in left eye, very limited vision right eye     Encounter Details:     CNP Questionnaire - 12/28/15 1207    Patient Demographics   Is this a new or existing patient? Existing   Patient is considered a/an Not Applicable   Race African-American/Black   Patient Assistance   Location of Patient Assistance Not Applicable   Patient's financial/insurance status Low Income;Medicaid   Uninsured Patient No   Patient referred to apply for the following financial assistance Not Applicable   Food insecurities addressed Provided food supplies   Transportation assistance No   Assistance securing medications No   Educational health offerings Not Applicable   Encounter Details   Primary purpose of visit Chronic Illness/Condition Visit;Education/Health Concerns;Spiritual Care/Support Visit   Was an Emergency Department visit averted? Not Applicable   Does patient have a medical provider? Yes   Patient referred to Not Applicable   Was a mental health screening completed? (GAINS tool) No   Does patient have dental issues? No   Since previous encounter, have you referred patient for abnormal blood pressure that resulted in a new diagnosis or medication change? No   Since previous encounter, have you referred patient for abnormal blood glucose that resulted in a new diagnosis or medication change? No   For Abstraction Use Only   Does patient have insurance? Yes       Clinic visit for B/P check and CBG.  Discussed his smoking habits.  States is smoking about 7 cigarettes a day.  Encouraged him to begin making efforts to stop smoking

## 2015-12-30 DIAGNOSIS — Z139 Encounter for screening, unspecified: Secondary | ICD-10-CM

## 2015-12-30 NOTE — Congregational Nurse Program (Signed)
Congregational Nurse Program Note  Date of Encounter: 12/30/2015  Past Medical History: Past Medical History  Diagnosis Date  . Diabetes mellitus   . Hypertension   . Substance abuse     cocaine. Stopped in 2010  . High cholesterol   . Tobacco use disorder 10/07/2014  . Blind     blind in left eye, very limited vision right eye     Encounter Details:     CNP Questionnaire - 12/30/15 1804    Patient Demographics   Is this a new or existing patient? Existing   Patient is considered a/an Not Applicable   Race African-American/Black   Patient Assistance   Location of Patient Assistance Not Applicable   Patient's financial/insurance status Low Income;Medicaid   Uninsured Patient No   Patient referred to apply for the following financial assistance Not Applicable   Food insecurities addressed Provided food supplies   Transportation assistance No   Assistance securing medications No   Educational health offerings Not Applicable   Encounter Details   Primary purpose of visit Chronic Illness/Condition Visit;Education/Health Concerns;Spiritual Care/Support Visit   Was an Emergency Department visit averted? Not Applicable   Does patient have a medical provider? Yes   Patient referred to Not Applicable   Was a mental health screening completed? (GAINS tool) No   Does patient have dental issues? No   Since previous encounter, have you referred patient for abnormal blood pressure that resulted in a new diagnosis or medication change? No   Since previous encounter, have you referred patient for abnormal blood glucose that resulted in a new diagnosis or medication change? No   For Abstraction Use Only   Does patient have insurance? Yes    B/P check.   126/78  Encouraged to continue working on decreasing number of cigarettes smoked during the day.

## 2016-01-04 DIAGNOSIS — Z139 Encounter for screening, unspecified: Secondary | ICD-10-CM

## 2016-01-04 DIAGNOSIS — R739 Hyperglycemia, unspecified: Secondary | ICD-10-CM

## 2016-01-04 LAB — GLUCOSE, POCT (MANUAL RESULT ENTRY): POC Glucose: 156 mg/dl — AB (ref 70–99)

## 2016-01-04 NOTE — Congregational Nurse Program (Signed)
Congregational Nurse Program Note  Date of Encounter: 01/04/2016  Past Medical History: Past Medical History  Diagnosis Date  . Diabetes mellitus   . Hypertension   . Substance abuse     cocaine. Stopped in 2010  . High cholesterol   . Tobacco use disorder 10/07/2014  . Blind     blind in left eye, very limited vision right eye     Encounter Details:     CNP Questionnaire - 01/04/16 1627    Patient Demographics   Is this a new or existing patient? Existing   Patient is considered a/an Not Applicable   Race African-American/Black   Patient Assistance   Location of Patient Assistance Not Applicable   Patient's financial/insurance status Low Income;Medicaid   Uninsured Patient No   Patient referred to apply for the following financial assistance Not Applicable   Food insecurities addressed Provided food supplies   Transportation assistance No   Assistance securing medications No   Educational health offerings Not Applicable   Encounter Details   Primary purpose of visit Chronic Illness/Condition Visit;Education/Health Concerns;Spiritual Care/Support Visit   Was an Emergency Department visit averted? Not Applicable   Does patient have a medical provider? Yes   Patient referred to Not Applicable   Was a mental health screening completed? (GAINS tool) No   Does patient have dental issues? No   Does patient have vision issues? No   Since previous encounter, have you referred patient for abnormal blood pressure that resulted in a new diagnosis or medication change? No   Since previous encounter, have you referred patient for abnormal blood glucose that resulted in a new diagnosis or medication change? No   For Abstraction Use Only   Does patient have insurance? Yes     clinic visit for B/P check and CBG

## 2016-01-11 ENCOUNTER — Other Ambulatory Visit: Payer: Self-pay | Admitting: Gastroenterology

## 2016-01-13 DIAGNOSIS — Z139 Encounter for screening, unspecified: Secondary | ICD-10-CM

## 2016-01-13 DIAGNOSIS — R739 Hyperglycemia, unspecified: Secondary | ICD-10-CM

## 2016-01-19 LAB — GLUCOSE, POCT (MANUAL RESULT ENTRY): POC GLUCOSE: 107 mg/dL — AB (ref 70–99)

## 2016-01-19 NOTE — Congregational Nurse Program (Signed)
Congregational Nurse Program Note  Date of Encounter: 01/13/2016  Past Medical History: Past Medical History  Diagnosis Date  . Diabetes mellitus   . Hypertension   . Substance abuse     cocaine. Stopped in 2010  . High cholesterol   . Tobacco use disorder 10/07/2014  . Blind     blind in left eye, very limited vision right eye     Encounter Details:     CNP Questionnaire - 01/13/16 1936    Patient Demographics   Is this a new or existing patient? Existing   Patient is considered a/an Not Applicable   Race African-American/Black   Patient Assistance   Location of Patient Assistance Not Applicable   Patient's financial/insurance status Low Income;Medicaid   Uninsured Patient No   Patient referred to apply for the following financial assistance Not Applicable   Food insecurities addressed Provided food supplies   Transportation assistance No   Assistance securing medications No   Educational health offerings Not Applicable   Encounter Details   Primary purpose of visit Chronic Illness/Condition Visit;Education/Health Concerns;Spiritual Care/Support Visit   Was an Emergency Department visit averted? Not Applicable   Does patient have a medical provider? Yes   Patient referred to Not Applicable   Was a mental health screening completed? (GAINS tool) No   Does patient have dental issues? No   Does patient have vision issues? No   Since previous encounter, have you referred patient for abnormal blood pressure that resulted in a new diagnosis or medication change? No   Since previous encounter, have you referred patient for abnormal blood glucose that resulted in a new diagnosis or medication change? No   For Abstraction Use Only   Does patient have insurance? Yes     B/P check and CBG.

## 2016-01-24 NOTE — Congregational Nurse Program (Signed)
Congregational Nurse Program Note  Date of Encounter: 10/14/2015  Past Medical History: Past Medical History  Diagnosis Date  . Diabetes mellitus   . Hypertension   . Substance abuse     cocaine. Stopped in 2010  . High cholesterol   . Tobacco use disorder 10/07/2014  . Blind     blind in left eye, very limited vision right eye     Encounter Details: B/P check

## 2016-01-26 ENCOUNTER — Ambulatory Visit (INDEPENDENT_AMBULATORY_CARE_PROVIDER_SITE_OTHER): Payer: Medicaid Other | Admitting: Podiatry

## 2016-01-26 ENCOUNTER — Encounter: Payer: Self-pay | Admitting: Podiatry

## 2016-01-26 DIAGNOSIS — M79676 Pain in unspecified toe(s): Secondary | ICD-10-CM | POA: Diagnosis not present

## 2016-01-26 DIAGNOSIS — B351 Tinea unguium: Secondary | ICD-10-CM | POA: Diagnosis not present

## 2016-01-26 NOTE — Patient Instructions (Signed)
Diabetes and Foot Care Diabetes may cause you to have problems because of poor blood supply (circulation) to your feet and legs. This may cause the skin on your feet to become thinner, break easier, and heal more slowly. Your skin may become dry, and the skin may peel and crack. You may also have nerve damage in your legs and feet causing decreased feeling in them. You may not notice minor injuries to your feet that could lead to infections or more serious problems. Taking care of your feet is one of the most important things you can do for yourself.  HOME CARE INSTRUCTIONS  Wear shoes at all times, even in the house. Do not go barefoot. Bare feet are easily injured.  Check your feet daily for blisters, cuts, and redness. If you cannot see the bottom of your feet, use a mirror or ask someone for help.  Wash your feet with warm water (do not use hot water) and mild soap. Then pat your feet and the areas between your toes until they are completely dry. Do not soak your feet as this can dry your skin.  Apply a moisturizing lotion or petroleum jelly (that does not contain alcohol and is unscented) to the skin on your feet and to dry, brittle toenails. Do not apply lotion between your toes.  Trim your toenails straight across. Do not dig under them or around the cuticle. File the edges of your nails with an emery board or nail file.  Do not cut corns or calluses or try to remove them with medicine.  Wear clean socks or stockings every day. Make sure they are not too tight. Do not wear knee-high stockings since they may decrease blood flow to your legs.  Wear shoes that fit properly and have enough cushioning. To break in new shoes, wear them for just a few hours a day. This prevents you from injuring your feet. Always look in your shoes before you put them on to be sure there are no objects inside.  Do not cross your legs. This may decrease the blood flow to your feet.  If you find a minor scrape,  cut, or break in the skin on your feet, keep it and the skin around it clean and dry. These areas may be cleansed with mild soap and water. Do not cleanse the area with peroxide, alcohol, or iodine.  When you remove an adhesive bandage, be sure not to damage the skin around it.  If you have a wound, look at it several times a day to make sure it is healing.  Do not use heating pads or hot water bottles. They may burn your skin. If you have lost feeling in your feet or legs, you may not know it is happening until it is too late.  Make sure your health care provider performs a complete foot exam at least annually or more often if you have foot problems. Report any cuts, sores, or bruises to your health care provider immediately. SEEK MEDICAL CARE IF:   You have an injury that is not healing.  You have cuts or breaks in the skin.  You have an ingrown nail.  You notice redness on your legs or feet.  You feel burning or tingling in your legs or feet.  You have pain or cramps in your legs and feet.  Your legs or feet are numb.  Your feet always feel cold. SEEK IMMEDIATE MEDICAL CARE IF:   There is increasing redness,   swelling, or pain in or around a wound.  There is a red line that goes up your leg.  Pus is coming from a wound.  You develop a fever or as directed by your health care provider.  You notice a bad smell coming from an ulcer or wound.   This information is not intended to replace advice given to you by your health care provider. Make sure you discuss any questions you have with your health care provider.   Document Released: 11/10/2000 Document Revised: 07/16/2013 Document Reviewed: 04/22/2013 Elsevier Interactive Patient Education 2016 [employer]  

## 2016-01-27 NOTE — Progress Notes (Signed)
Patient ID: Brady Church, male   DOB: November 03, 1961, 55 y.o.   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

## 2016-02-01 ENCOUNTER — Emergency Department (HOSPITAL_COMMUNITY)
Admission: EM | Admit: 2016-02-01 | Discharge: 2016-02-01 | Disposition: A | Discharge status: Home / Self Care | Payer: Medicaid Other | Attending: Emergency Medicine | Admitting: Emergency Medicine

## 2016-02-01 ENCOUNTER — Encounter (HOSPITAL_COMMUNITY): Payer: Self-pay | Admitting: Emergency Medicine

## 2016-02-01 DIAGNOSIS — E119 Type 2 diabetes mellitus without complications: Secondary | ICD-10-CM | POA: Insufficient documentation

## 2016-02-01 DIAGNOSIS — H538 Other visual disturbances: Secondary | ICD-10-CM | POA: Diagnosis not present

## 2016-02-01 DIAGNOSIS — R51 Headache: Secondary | ICD-10-CM | POA: Diagnosis not present

## 2016-02-01 DIAGNOSIS — F17210 Nicotine dependence, cigarettes, uncomplicated: Secondary | ICD-10-CM | POA: Insufficient documentation

## 2016-02-01 DIAGNOSIS — I10 Essential (primary) hypertension: Secondary | ICD-10-CM | POA: Insufficient documentation

## 2016-02-01 LAB — CBG MONITORING, ED: GLUCOSE-CAPILLARY: 111 mg/dL — AB (ref 65–99)

## 2016-02-01 NOTE — ED Notes (Signed)
Call no answer 

## 2016-02-01 NOTE — ED Provider Notes (Signed)
Patient eloped from the emergency department before provider evaluation. I did not see or evaluate this patient. Triage note states headache and blurred vision. Pt was never placed in a room for evaluation.     Dahlia Client Sonia Bromell, PA-C 02/01/16 2119  Doug Sou, MD 02/02/16 [phone]

## 2016-02-01 NOTE — ED Notes (Addendum)
Patient complained of blurred vision at check in, but then advised "i am blind".  Patient also states "i have diabetes".

## 2016-02-01 NOTE — ED Notes (Signed)
No neuro deficits noted, grip strengths equal, no facial droop or arm drift. AAOx4

## 2016-02-01 NOTE — ED Notes (Signed)
Contacted emergency contact: Evalee Jefferson (brother?) @ [phone] per patient request.  Informed him that patient was here in ED wait and that "he may need a ride later."

## 2016-02-01 NOTE — ED Notes (Signed)
Pt c/o headache x1 day. Hx HTN and diabetes. States both have been under control. Pt also c/o cough.

## 2016-02-03 DIAGNOSIS — R739 Hyperglycemia, unspecified: Secondary | ICD-10-CM

## 2016-02-03 DIAGNOSIS — Z139 Encounter for screening, unspecified: Secondary | ICD-10-CM

## 2016-02-03 LAB — GLUCOSE, POCT (MANUAL RESULT ENTRY): POC GLUCOSE: 114 mg/dL — AB (ref 70–99)

## 2016-02-03 NOTE — Congregational Nurse Program (Signed)
Congregational Nurse Program Note  Date of Encounter: 02/03/2016  Past Medical History: Past Medical History  Diagnosis Date  . Diabetes mellitus   . Hypertension   . Substance abuse     cocaine. Stopped in 2010  . High cholesterol   . Tobacco use disorder 10/07/2014  . Blind     blind in left eye, very limited vision right eye     Encounter Details:     CNP Questionnaire - 02/03/16 0921    Patient Demographics   Is this a new or existing patient? Existing   Patient is considered a/an Not Applicable   Race African-American/Black   Patient Assistance   Location of Patient Assistance Not Applicable   Patient's financial/insurance status Low Income;Medicaid   Uninsured Patient No   Patient referred to apply for the following financial assistance Not Applicable   Food insecurities addressed Not Applicable   Transportation assistance No   Assistance securing medications No   Educational health offerings Diabetes;Nutrition   Encounter Details   Primary purpose of visit Chronic Illness/Condition Visit;Education/Health Concerns;Spiritual Care/Support Visit   Was an Emergency Department visit averted? Not Applicable   Does patient have a medical provider? Yes   Patient referred to Not Applicable   Was a mental health screening completed? (GAINS tool) No   Does patient have dental issues? No   Does patient have vision issues? No   Since previous encounter, have you referred patient for abnormal blood pressure that resulted in a new diagnosis or medication change? No   Since previous encounter, have you referred patient for abnormal blood glucose that resulted in a new diagnosis or medication change? No   For Abstraction Use Only   Does patient have insurance? Yes       B/p and CBG check.  States is getting over a cold "or something".  Feeling better.  States continues to reduce amount of smoking.  Down to about 6 cigarettes/day.

## 2016-02-24 DIAGNOSIS — Z139 Encounter for screening, unspecified: Secondary | ICD-10-CM

## 2016-02-24 DIAGNOSIS — R739 Hyperglycemia, unspecified: Secondary | ICD-10-CM

## 2016-02-29 LAB — GLUCOSE, POCT (MANUAL RESULT ENTRY): POC Glucose: 100 mg/dl — AB (ref 70–99)

## 2016-02-29 NOTE — Congregational Nurse Program (Signed)
Congregational Nurse Program Note  Date of Encounter: 02/24/2016  Past Medical History: Past Medical History  Diagnosis Date  . Diabetes mellitus   . Hypertension   . Substance abuse     cocaine. Stopped in 2010  . High cholesterol   . Tobacco use disorder 10/07/2014  . Blind     blind in left eye, very limited vision right eye     Encounter Details:     CNP Questionnaire - 02/24/16 1413    Patient Demographics   Is this a new or existing patient? Existing   Patient is considered a/an Not Applicable   Race African-American/Black   Patient Assistance   Location of Patient Assistance Not Applicable   Patient's financial/insurance status Low Income;Medicaid   Uninsured Patient No   Patient referred to apply for the following financial assistance Not Applicable   Food insecurities addressed Not Applicable   Transportation assistance No   Assistance securing medications No   Educational health offerings Diabetes;Nutrition;Chronic disease;Hypertension;Behavioral health   Encounter Details   Primary purpose of visit Chronic Illness/Condition Visit;Education/Health Concerns;Spiritual Care/Support Visit   Was an Emergency Department visit averted? Not Applicable   Does patient have a medical provider? Yes   Patient referred to Not Applicable   Was a mental health screening completed? (GAINS tool) No   Does patient have dental issues? No   Does patient have vision issues? No   Since previous encounter, have you referred patient for abnormal blood pressure that resulted in a new diagnosis or medication change? No   Since previous encounter, have you referred patient for abnormal blood glucose that resulted in a new diagnosis or medication change? No   For Abstraction Use Only   Does patient have insurance? Yes     B/P and CBG check.  States has not eaten today (2 pm).  Could not give a reason why, just that he was busy.  Admits to increased drinking, thinking that has raised his  B/P.  States he had 12 beers last night.  Discussed with him concerns about his increased drinking.  Client indicated it was not a problem.  Encouraged to return to clinic for follow up

## 2016-03-01 ENCOUNTER — Ambulatory Visit (INDEPENDENT_AMBULATORY_CARE_PROVIDER_SITE_OTHER): Payer: Medicaid Other | Admitting: Internal Medicine

## 2016-03-01 ENCOUNTER — Encounter: Payer: Self-pay | Admitting: Internal Medicine

## 2016-03-01 ENCOUNTER — Telehealth: Payer: Self-pay | Admitting: Internal Medicine

## 2016-03-01 VITALS — BP 139/80 | HR 74 | Temp 98.0°F | Ht 68.0 in | Wt 240.5 lb

## 2016-03-01 DIAGNOSIS — E1136 Type 2 diabetes mellitus with diabetic cataract: Secondary | ICD-10-CM | POA: Diagnosis not present

## 2016-03-01 DIAGNOSIS — I208 Other forms of angina pectoris: Secondary | ICD-10-CM

## 2016-03-01 DIAGNOSIS — E785 Hyperlipidemia, unspecified: Secondary | ICD-10-CM | POA: Diagnosis not present

## 2016-03-01 DIAGNOSIS — Z1211 Encounter for screening for malignant neoplasm of colon: Secondary | ICD-10-CM

## 2016-03-01 DIAGNOSIS — I10 Essential (primary) hypertension: Secondary | ICD-10-CM | POA: Diagnosis not present

## 2016-03-01 DIAGNOSIS — Z79899 Other long term (current) drug therapy: Secondary | ICD-10-CM

## 2016-03-01 DIAGNOSIS — I2089 Other forms of angina pectoris: Secondary | ICD-10-CM

## 2016-03-01 DIAGNOSIS — Z7984 Long term (current) use of oral hypoglycemic drugs: Secondary | ICD-10-CM | POA: Diagnosis not present

## 2016-03-01 LAB — GLUCOSE, CAPILLARY: Glucose-Capillary: 85 mg/dL (ref 65–99)

## 2016-03-01 LAB — POCT GLYCOSYLATED HEMOGLOBIN (HGB A1C): Hemoglobin A1C: 6.1

## 2016-03-01 MED ORDER — LOVASTATIN 20 MG PO TABS: 40.0000 mg | ORAL_TABLET | Freq: Every day | ORAL | Status: DC

## 2016-03-01 NOTE — Assessment & Plan Note (Signed)
Patient underwent Colonoscopy in February 2017. Surgical pathology of polyp revealed tubular adenoma without high grade dysplasia.   Plan: -Repeat colonoscopy in 2027

## 2016-03-01 NOTE — Progress Notes (Signed)
Subjective:    Patient ID: Brady Church, male    DOB: Feb 04, 1961, 55 y.o.   MRN: [id number]  HPI Brady Church is a 55 y.o. male with PMHx of HTN, Stable Angina, T2DM who presents to the clinic for follow up for T2DM. Please see A&P for the status of the patient's chronic medical problems.   Past Medical History  Diagnosis Date  . Diabetes mellitus   . Hypertension   . Substance abuse     cocaine. Stopped in 2010  . High cholesterol   . Tobacco use disorder 10/07/2014  . Blind     blind in left eye, very limited vision right eye     Outpatient Encounter Prescriptions as of 03/01/2016  Medication Sig Note  . ACCU-CHEK FASTCLIX LANCETS MISC 1 each by Does not apply route 2 (two) times daily.   Marland Kitchen aspirin EC 81 MG tablet Take 1 tablet (81 mg total) by mouth daily.   . carvedilol (COREG) 6.25 MG tablet Take 1 tablet (6.25 mg total) by mouth 2 (two) times daily with a meal.   . dorzolamide-timolol (COSOPT) 22.3-6.8 MG/ML ophthalmic solution Place 1 drop into the right eye 2 (two) times daily. 04/19/2015: .   Marland Kitchen DUREZOL 0.05 % EMUL Place 1 drop into both eyes 2 (two) times daily.  04/19/2015: .   . glucose blood (ACCU-CHEK SMARTVIEW) test strip Use as instructed   . hydrochlorothiazide (HYDRODIURIL) 12.5 MG tablet Take 1 tablet (12.5 mg total) by mouth daily.   Marland Kitchen lisinopril (PRINIVIL,ZESTRIL) 20 MG tablet Take 1 tablet (20 mg total) by mouth daily.   Marland Kitchen lovastatin (MEVACOR) 20 MG tablet Take 2 tablets (40 mg total) by mouth at bedtime.   . metFORMIN (GLUCOPHAGE) 1000 MG tablet Take 1 tablet (1,000 mg total) by mouth 2 (two) times daily with a meal.   . [DISCONTINUED] lovastatin (MEVACOR) 40 MG tablet Take 1 tablet (40 mg total) by mouth daily.    No facility-administered encounter medications on file as of 03/01/2016.    Family History  Problem Relation Age of Onset  . Coronary artery disease Mother 27    died of MI  . Cancer Father 60    Some GI cancer. Not sure if it was Colon  Cancer or not.  . Hypertension Father   . Diabetes Maternal Grandmother     Social History   Social History  . Marital Status: Single    Spouse Name: [name]  . Number of Children: [name]  . Years of Education: 12   Occupational History  . Not on file.   Social History Main Topics  . Smoking status: Current Every Day Smoker -- 0.20 packs/day    Types: Cigarettes    Last Attempt to Quit: 01/16/2006  . Smokeless tobacco: Not on file     Comment:  smokes about 4 a day.  . Alcohol Use: 0.0 oz/week    0 Standard drinks or equivalent per week     Comment: Beer- 16oz daily.  . Drug Use: No     Comment: Previous cocaine user. Stopped 2 years before.  Marland Kitchen Sexual Activity: Not on file   Other Topics Concern  . Not on file   Social History Narrative   Lives in [place] for last 10 years with "friend"   Is not married and does not have kids.   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   Review of  Systems General: Denies fatigue, change in appetite and diaphoresis.  Respiratory: Denies SOB, cough, DOE.   Cardiovascular: Denies chest pain and palpitations.  Gastrointestinal: Denies abdominal pain, diarrhea, constipationion.  Endocrine: Denies polyuria, and polydipsia. Musculoskeletal: Denies myalgias Neurological: Denies dizziness, headaches, weakness, lightheadedness     Objective:   Physical Exam Filed Vitals:   03/01/16 1417  BP: 139/80  Pulse: 74  Temp: 98 F (36.7 C)  TempSrc: Oral  Height: 5\' 8"  (1.727 m)  Weight: 240 lb 8 oz (109.09 kg)  SpO2: 98%   General: Vital signs reviewed.  Patient is in no acute distress and cooperative with exam.  Cardiovascular: RRR, S1 normal, S2 normal Pulmonary/Chest: Clear to auscultation bilaterally, no wheezes, rales, or rhonchi. Abdominal: Soft, non-tender, non-distended, BS +.  Extremities: No lower extremity edema bilaterally Neurological: Alert and  oriented Skin: Warm, dry and intact.  Psychiatric: Normal mood and affect. speech and behavior is normal. Cognition and memory are normal.      Assessment & Plan:   Please see problem based assessment and plan.

## 2016-03-01 NOTE — Assessment & Plan Note (Addendum)
Lab Results  Component Value Date   HGBA1C 6.1 03/01/2016   HGBA1C 6.2 11/03/2015   HGBA1C 8.3 07/21/2015     Assessment: Diabetes control:  Controlled Progress toward A1C goal:    At goal Comments: Patient is on Metformin 1000 mg BID.  Plan: Medications:  continue current medications Instruction/counseling given: discussed the need for weight loss and discussed diet Other plans: Follow up in 3 months

## 2016-03-01 NOTE — Patient Instructions (Signed)
KEEP TAKING ALL OF YOUR MEDICATIONS AS PRESCRIBED. YOU ARE DOING A GREAT JOB!  IN ADDITION TO YOUR OTHER MEDICATIONS, START TAKING LOVASTATIN 40 MG A DAY (2 PILLS ONCE A DAY).  WORK ON CUTTING BACK ON DRINKING ALCOHOL AND SMOKING.  FOLLOW UP IN 3 MONTHS

## 2016-03-01 NOTE — Telephone Encounter (Signed)
APPT. REMINDER CALL, NO ANSWER, NO VOICE MAIL °

## 2016-03-01 NOTE — Assessment & Plan Note (Addendum)
BP Readings from Last 3 Encounters:  03/01/16 139/80  02/24/16 140/90  02/03/16 140/80    Lab Results  Component Value Date   NA 140 03/01/2016   K 4.1 03/01/2016   CREATININE 0.87 03/01/2016    Assessment: Blood pressure control:   Controlled; congregational nursing BPs have ranged 120-140s/80-90s in the last 6 months Progress toward BP goal:   Stable Comments: On lisinopril 20 mg daily, Coreg 6.25 mg BID, and HCTZ 12.5 mg daily.   Plan: Medications:  continue current medications Other plans: Could consider increasing lisinopril to 40 mg daily in the future

## 2016-03-02 LAB — BMP8+ANION GAP
Anion Gap: 21 mmol/L — ABNORMAL HIGH (ref 10.0–18.0)
BUN / CREAT RATIO: 14 (ref 9–20)
BUN: 12 mg/dL (ref 6–24)
CALCIUM: 8.8 mg/dL (ref 8.7–10.2)
CHLORIDE: 99 mmol/L (ref 96–106)
CO2: 20 mmol/L (ref 18–29)
CREATININE: 0.87 mg/dL (ref 0.76–1.27)
GFR calc Af Amer: 114 mL/min/{1.73_m2} (ref >59)
GFR calc non Af Amer: 99 mL/min/{1.73_m2} (ref >59)
GLUCOSE: 87 mg/dL (ref 65–99)
Potassium: 4.1 mmol/L (ref 3.5–5.2)
Sodium: 140 mmol/L (ref 134–144)

## 2016-03-02 NOTE — Assessment & Plan Note (Signed)
Patient's Lovastatin 40 mg daily had fallen off of his list and he was not taking it.   Plan: -Refilled Lovastatin 40 mg daily

## 2016-03-02 NOTE — Assessment & Plan Note (Signed)
Patient states symptoms are resolved. He denies chest pain or DOE. Myoview from Fall 2016 showed no signs of ischemia, cath was not recommended.  Plan: -Continue lisinopril 20 mg daily -Continue ASA 81 mg daily -Continue Coreg 6.25 mg BID -Continue HCTZ 12.5 mg daily -Continue moderate intensity statin Lovastatin 40 mg daily

## 2016-03-03 NOTE — Progress Notes (Signed)
Internal Medicine Clinic Attending  Case discussed with Dr. Richardson soon after the resident saw the patient.  We reviewed the resident's history and exam and pertinent patient test results.  I agree with the assessment, diagnosis, and plan of care documented in the resident's note. 

## 2016-03-29 ENCOUNTER — Encounter: Payer: Self-pay | Admitting: *Deleted

## 2016-04-13 DIAGNOSIS — Z139 Encounter for screening, unspecified: Secondary | ICD-10-CM

## 2016-04-19 ENCOUNTER — Ambulatory Visit (INDEPENDENT_AMBULATORY_CARE_PROVIDER_SITE_OTHER): Payer: Medicaid Other | Admitting: Podiatry

## 2016-04-19 ENCOUNTER — Encounter: Payer: Self-pay | Admitting: Podiatry

## 2016-04-19 DIAGNOSIS — B351 Tinea unguium: Secondary | ICD-10-CM | POA: Diagnosis not present

## 2016-04-19 DIAGNOSIS — M79676 Pain in unspecified toe(s): Secondary | ICD-10-CM | POA: Diagnosis not present

## 2016-04-19 NOTE — Patient Instructions (Signed)
Diabetes and Foot Care Diabetes may cause you to have problems because of poor blood supply (circulation) to your feet and legs. This may cause the skin on your feet to become thinner, break easier, and heal more slowly. Your skin may become dry, and the skin may peel and crack. You may also have nerve damage in your legs and feet causing decreased feeling in them. You may not notice minor injuries to your feet that could lead to infections or more serious problems. Taking care of your feet is one of the most important things you can do for yourself.  HOME CARE INSTRUCTIONS  Wear shoes at all times, even in the house. Do not go barefoot. Bare feet are easily injured.  Check your feet daily for blisters, cuts, and redness. If you cannot see the bottom of your feet, use a mirror or ask someone for help.  Wash your feet with warm water (do not use hot water) and mild soap. Then pat your feet and the areas between your toes until they are completely dry. Do not soak your feet as this can dry your skin.  Apply a moisturizing lotion or petroleum jelly (that does not contain alcohol and is unscented) to the skin on your feet and to dry, brittle toenails. Do not apply lotion between your toes.  Trim your toenails straight across. Do not dig under them or around the cuticle. File the edges of your nails with an emery board or nail file.  Do not cut corns or calluses or try to remove them with medicine.  Wear clean socks or stockings every day. Make sure they are not too tight. Do not wear knee-high stockings since they may decrease blood flow to your legs.  Wear shoes that fit properly and have enough cushioning. To break in new shoes, wear them for just a few hours a day. This prevents you from injuring your feet. Always look in your shoes before you put them on to be sure there are no objects inside.  Do not cross your legs. This may decrease the blood flow to your feet.  If you find a minor scrape,  cut, or break in the skin on your feet, keep it and the skin around it clean and dry. These areas may be cleansed with mild soap and water. Do not cleanse the area with peroxide, alcohol, or iodine.  When you remove an adhesive bandage, be sure not to damage the skin around it.  If you have a wound, look at it several times a day to make sure it is healing.  Do not use heating pads or hot water bottles. They may burn your skin. If you have lost feeling in your feet or legs, you may not know it is happening until it is too late.  Make sure your health care provider performs a complete foot exam at least annually or more often if you have foot problems. Report any cuts, sores, or bruises to your health care provider immediately. SEEK MEDICAL CARE IF:   You have an injury that is not healing.  You have cuts or breaks in the skin.  You have an ingrown nail.  You notice redness on your legs or feet.  You feel burning or tingling in your legs or feet.  You have pain or cramps in your legs and feet.  Your legs or feet are numb.  Your feet always feel cold. SEEK IMMEDIATE MEDICAL CARE IF:   There is increasing redness,   swelling, or pain in or around a wound.  There is a red line that goes up your leg.  Pus is coming from a wound.  You develop a fever or as directed by your health care provider.  You notice a bad smell coming from an ulcer or wound.   This information is not intended to replace advice given to you by your health care provider. Make sure you discuss any questions you have with your health care provider.   Document Released: 11/10/2000 Document Revised: 07/16/2013 Document Reviewed: 04/22/2013 Elsevier Interactive Patient Education 2016 [employer]  

## 2016-04-20 LAB — GLUCOSE, POCT (MANUAL RESULT ENTRY): POC GLUCOSE: 112 mg/dL — AB (ref 70–99)

## 2016-04-20 NOTE — Congregational Nurse Program (Signed)
Congregational Nurse Program Note  Date of Encounter: 04/13/2016  Past Medical History: Past Medical History  Diagnosis Date  . Diabetes mellitus   . Hypertension   . Substance abuse     cocaine. Stopped in 2010  . High cholesterol   . Tobacco use disorder 10/07/2014  . Blind     blind in left eye, very limited vision right eye     Encounter Details:     CNP Questionnaire - 04/13/16 0926    Patient Demographics   Is this a new or existing patient? Existing   Patient is considered a/an Not Applicable   Race African-American/Black   Patient Assistance   Location of Patient Assistance Not Applicable   Patient's financial/insurance status Low Income;Medicaid   Uninsured Patient No   Patient referred to apply for the following financial assistance Not Applicable   Food insecurities addressed Not Applicable   Transportation assistance No   Assistance securing medications No   Educational health offerings Diabetes;Chronic disease;Hypertension   Encounter Details   Primary purpose of visit Chronic Illness/Condition Visit;Education/Health Concerns;Spiritual Care/Support Visit   Was an Emergency Department visit averted? Not Applicable   Does patient have a medical provider? Yes   Patient referred to Not Applicable   Was a mental health screening completed? (GAINS tool) No   Does patient have dental issues? No   Does patient have vision issues? No   Does your patient have an abnormal blood pressure today? No   Since previous encounter, have you referred patient for abnormal blood pressure that resulted in a new diagnosis or medication change? No   Does your patient have an abnormal blood glucose today? No   Since previous encounter, have you referred patient for abnormal blood glucose that resulted in a new diagnosis or medication change? No   Was there a life-saving intervention made? No       B/P check and CBG.  B/P elevated states has not taken B/P medication this AM.   States is doing very well.

## 2016-04-20 NOTE — Progress Notes (Signed)
Patient ID: Brady Church, male   DOB: 07/11/1961, 54 y.o.   MRN: [id number]   Subjective: Today this patient presents for a scheduled visit complaining of painful, elongated and thickened toenails and requests toenail debridement   Objective:  Vascular: No peripheral edema noted bilaterally DP and PT pulses 2/4 bilaterally Capillary reflex immediate bilaterally  Neurological: Sensation to 10 g monofilament wire intact 5/5 bilaterally Vibratory sensation reactive bilaterally Ankle reflex equal and reactive bilaterally  Dermatological: Texture and turgor within normal limits bilaterally No skin lesions noted bilaterally The toenails are incurvated, elongated, discolored with texture and color changes in palpable tenderness 6-10  Musculoskeletal: Patient walks slowly with assistance of cane for visual impairment Hammertoe deformity second bilaterally There is no restriction ankle, subtalar, midtarsal joints bilaterally   Assessment: Symptomatic onychomycoses 6-10  Diabetic without complications   Plan: Debridement toenails 6-10 mechanical and  electrically without any bleeding  Reappoint 3 months   

## 2016-05-03 ENCOUNTER — Ambulatory Visit: Payer: Medicaid Other | Admitting: Podiatry

## 2016-05-16 ENCOUNTER — Telehealth: Payer: Self-pay | Admitting: Dietician

## 2016-05-23 ENCOUNTER — Ambulatory Visit (INDEPENDENT_AMBULATORY_CARE_PROVIDER_SITE_OTHER): Payer: Medicaid Other | Admitting: Internal Medicine

## 2016-05-23 ENCOUNTER — Encounter: Payer: Self-pay | Admitting: Internal Medicine

## 2016-05-23 ENCOUNTER — Telehealth: Payer: Self-pay | Admitting: *Deleted

## 2016-05-23 VITALS — BP 131/75 | HR 73 | Temp 98.1°F | Ht 68.0 in | Wt 245.3 lb

## 2016-05-23 DIAGNOSIS — R21 Rash and other nonspecific skin eruption: Secondary | ICD-10-CM | POA: Insufficient documentation

## 2016-05-23 LAB — GLUCOSE, CAPILLARY: Glucose-Capillary: 117 mg/dL — ABNORMAL HIGH (ref 65–99)

## 2016-05-23 MED ORDER — DIPHENHYDRAMINE HCL 25 MG PO CAPS: 25.0000 mg | ORAL_CAPSULE | Freq: Three times a day (TID) | ORAL | Status: DC | PRN

## 2016-05-23 NOTE — Assessment & Plan Note (Signed)
Has rash on left forearm, multiple skin colored papular lesions, with some area of excoriation, no discharge, no erythema, or other changes.  Unclear etiology but likely had some contact allergic reaction to something on the couch.  Will do benadryl prn for itching, otherwise expect this to resolve spontaneously within next few days.

## 2016-05-23 NOTE — Telephone Encounter (Signed)
WALK IN Pt c/o rash to L arm since Friday after visiting a friend and propping L forearm up on back of sofa, itchy, nondraining, has been using a cream given to him by another friend appt w/ dr Tasia Catchings

## 2016-05-23 NOTE — Telephone Encounter (Signed)
agree

## 2016-05-23 NOTE — Progress Notes (Signed)
   Subjective:    Patient ID: Brady Church, male    DOB: [birth year], 55 y.o.   MRN: [id number]  HPI  55 yo M with chronic combined CHF, HTN, stable anginga, GERD, DM II, HLD presents with rash.  Rash started on Friday. He went to a friends house, was sitting on friend's couch with his left arm on top of the couch. Later that day after few hours he started noticing tiny bumps on his left forearm which were pruritic. No new meds, no new soaps or lotions, or any other contact history. No fever, no other skin changes. Was using an antifungal cream from a friend which did not make any difference. He is legally blind but denies any bug bites or any other factors that could have caused the rash.    Review of Systems  Constitutional: Negative for fever and chills.  Respiratory: Negative for chest tightness and shortness of breath.   Cardiovascular: Negative for chest pain, palpitations and leg swelling.  Skin: Positive for rash.  Neurological: Negative for dizziness and numbness.       Objective:   Physical Exam  Constitutional: He is oriented to person, place, and time. He appears well-developed and well-nourished. No distress.  HENT:  Head: Normocephalic and atraumatic.  Eyes: Conjunctivae are normal. Right eye exhibits no discharge. Left eye exhibits no discharge. No scleral icterus.  Neck: Normal range of motion.  Cardiovascular: Normal rate, regular rhythm, S1 normal, S2 normal and normal heart sounds.  Exam reveals no gallop and no friction rub.   No murmur heard. Pulmonary/Chest: Effort normal and breath sounds normal. No respiratory distress. He has no wheezes. He has no rales. He exhibits no tenderness.  Abdominal: Soft. Bowel sounds are normal. He exhibits no distension. There is no tenderness.  Musculoskeletal: Normal range of motion. He exhibits no edema or tenderness.   multiple skin colored papular lesions, with some area of excoriation on left forearm, no discharge, no  erythema, or other changes.  Neurological: He is alert and oriented to person, place, and time. He has normal strength and normal reflexes. No cranial nerve deficit or sensory deficit.  Skin: He is not diaphoretic.  Psychiatric: He has a normal mood and affect.      Filed Vitals:   05/23/16 1456  BP: 131/75  Pulse: 73  Temp: 98.1 F (36.7 C)       Assessment & Plan:  See problem based a&p.

## 2016-05-23 NOTE — Patient Instructions (Signed)
I am glad you came in. Please stop using the antifungal cream.  Take benadryl as needed for itching.  Follow up as needed if this is not getting better in next 1 week.

## 2016-05-24 NOTE — Progress Notes (Signed)
Internal Medicine Clinic Attending  Case discussed with Dr. Ahmed at the time of the visit.  We reviewed the resident's history and exam and pertinent patient test results.  I agree with the assessment, diagnosis, and plan of care documented in the resident's note. 

## 2016-05-24 NOTE — Telephone Encounter (Signed)
Called to encourage doctor appointment and diabetes self management follow up

## 2016-05-26 ENCOUNTER — Telehealth: Payer: Self-pay

## 2016-05-26 NOTE — Telephone Encounter (Signed)
Pt states Benadryl is not at the pharmacy. Please call back.

## 2016-05-26 NOTE — Telephone Encounter (Signed)
Called pt back, told him to buy otc, he was agreeable

## 2016-05-31 ENCOUNTER — Encounter: Payer: Medicaid Other | Admitting: Internal Medicine

## 2016-06-01 DIAGNOSIS — I10 Essential (primary) hypertension: Secondary | ICD-10-CM

## 2016-06-05 ENCOUNTER — Encounter: Payer: Self-pay | Admitting: Internal Medicine

## 2016-06-05 ENCOUNTER — Ambulatory Visit (INDEPENDENT_AMBULATORY_CARE_PROVIDER_SITE_OTHER): Payer: Medicaid Other | Admitting: Internal Medicine

## 2016-06-05 VITALS — BP 157/96 | HR 74 | Temp 98.3°F | Ht 68.0 in | Wt 244.0 lb

## 2016-06-05 DIAGNOSIS — I10 Essential (primary) hypertension: Secondary | ICD-10-CM

## 2016-06-05 DIAGNOSIS — Z7984 Long term (current) use of oral hypoglycemic drugs: Secondary | ICD-10-CM | POA: Diagnosis not present

## 2016-06-05 DIAGNOSIS — Z79899 Other long term (current) drug therapy: Secondary | ICD-10-CM

## 2016-06-05 DIAGNOSIS — Z299 Encounter for prophylactic measures, unspecified: Secondary | ICD-10-CM

## 2016-06-05 DIAGNOSIS — E1136 Type 2 diabetes mellitus with diabetic cataract: Secondary | ICD-10-CM

## 2016-06-05 DIAGNOSIS — I5042 Chronic combined systolic (congestive) and diastolic (congestive) heart failure: Secondary | ICD-10-CM

## 2016-06-05 LAB — POCT GLYCOSYLATED HEMOGLOBIN (HGB A1C): HEMOGLOBIN A1C: 6.4

## 2016-06-05 LAB — GLUCOSE, CAPILLARY: Glucose-Capillary: 107 mg/dL — ABNORMAL HIGH (ref 65–99)

## 2016-06-05 MED ORDER — LISINOPRIL 40 MG PO TABS: 40.0000 mg | ORAL_TABLET | Freq: Every day | ORAL | Status: DC

## 2016-06-05 NOTE — Progress Notes (Signed)
Patient ID: Brady Church, male   DOB: October 09, 1961, 55 y.o.   MRN: [id number]   CC: follow up for HTN and DM   HPI:  Mr.Brady Church is a 55 y.o. man with PMHx as detailed below presenting for follow up of HTN and DM.  Please see A&P for status of conditions addressed today.  Past Medical History  Diagnosis Date  . Diabetes mellitus   . Hypertension   . Substance abuse     cocaine. Stopped in 2010  . High cholesterol   . Tobacco use disorder 10/07/2014  . Blind     blind in left eye, very limited vision right eye     Review of Systems:   Review of Systems  Constitutional: Negative for fever and chills.  Respiratory: Negative for cough.   Cardiovascular: Negative for chest pain.   Physical Exam:  Filed Vitals:   06/05/16 0943  BP: 157/96  Pulse: 74  Temp: 98.3 F (36.8 C)  TempSrc: Oral  Height: 5\' 8"  (1.727 m)  Weight: 244 lb (110.678 kg)  SpO2: 100%   Physical Exam  Constitutional: He is oriented to person, place, and time and well-developed, well-nourished, and in no distress.  HENT:  Head: Normocephalic and atraumatic.  Pulmonary/Chest: Effort normal.  Neurological: He is alert and oriented to person, place, and time.  Psychiatric: Mood and affect normal.     Assessment & Plan:   See encounters tab for problem based medical decision making.   Patient discussed with Dr. Criselda Peaches   Hypertension BP Readings from Last 3 Encounters:  06/05/16 157/96  05/23/16 131/75  04/13/16 140/102   A: BP is elevated today as well as recently according to [facility] notes.  Other meds inculde HCTZ 12.5mg  daily and Carvedilol 6.25mg  BID.  P: - will continue HCTZ and Coreg at current dosing - increase Lisinopril to 40mg  daily - check BMET at follow up with PCP in 1 month - encouraged BP log, diet, and exercise modifications  Diabetes mellitus with diabetic cataract Lab Results  Component Value Date   HGBA1C 6.4 06/05/2016   A: Hgb A1c today is  still at goal, only slightly deteriorated from previous reading 3 months ago.  P: - continue metformin 1000mg  BID - further management per PCP   Preventive measure A/P: - patient declined Tdap today

## 2016-06-05 NOTE — Assessment & Plan Note (Signed)
A/P: - patient declined Tdap today

## 2016-06-05 NOTE — Assessment & Plan Note (Signed)
BP Readings from Last 3 Encounters:  06/05/16 157/96  05/23/16 131/75  04/13/16 140/102   A: BP is elevated today as well as recently according to [facility] notes.  Other meds inculde HCTZ 12.5mg  daily and Carvedilol 6.25mg  BID.  P: - will continue HCTZ and Coreg at current dosing - increase Lisinopril to 40mg  daily - check BMET at follow up with PCP in 1 month - encouraged BP log, diet, and exercise modifications

## 2016-06-05 NOTE — Patient Instructions (Signed)
Thank you for coming to see me today. It was a pleasure. Today we talked about:   BP: I am increasing your lisinopril to 40mg  daily.  Please follow up with Dr. Karlene Lineman as scheduled  DM: Continue the great work.  I am checking your A1c today.  Please follow-up with Dr. Lawerance Bach as scheduled.  If you have any questions or concerns, please do not hesitate to call the office at [phone].  Take Care,   Gwynn Burly, DO

## 2016-06-05 NOTE — Assessment & Plan Note (Signed)
Lab Results  Component Value Date   HGBA1C 6.4 06/05/2016   A: Hgb A1c today is still at goal, only slightly deteriorated from previous reading 3 months ago.  P: - continue metformin 1000mg  BID - further management per PCP

## 2016-06-06 NOTE — Progress Notes (Signed)
Internal Medicine Clinic Attending  Case discussed with Dr. Wallace soon after the resident saw the patient.  We reviewed the resident's history and exam and pertinent patient test results.  I agree with the assessment, diagnosis, and plan of care documented in the resident's note. 

## 2016-06-06 NOTE — Addendum Note (Signed)
Addended by: Debe Coder B on: 06/06/2016 09:08 AM   Modules accepted: Level of Service

## 2016-06-08 DIAGNOSIS — I10 Essential (primary) hypertension: Secondary | ICD-10-CM

## 2016-06-09 ENCOUNTER — Ambulatory Visit: Payer: Medicaid Other

## 2016-06-13 DIAGNOSIS — I10 Essential (primary) hypertension: Secondary | ICD-10-CM

## 2016-06-18 LAB — GLUCOSE, POCT (MANUAL RESULT ENTRY)
POC GLUCOSE: 128 mg/dL — AB (ref 70–99)
POC Glucose: 188 mg/dl — AB (ref 70–99)

## 2016-06-18 NOTE — Congregational Nurse Program (Signed)
Congregational Nurse Program Note  Date of Encounter: 06/01/2016  Past Medical History: Past Medical History:  Diagnosis Date  . 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 Diabetes mellitus   . High cholesterol   . Hypertension   . Substance abuse    cocaine. Stopped in 2010  . Tobacco use disorder 10/07/2014    Encounter Details:     CNP Questionnaire - 06/01/16 1514      Patient Demographics   Is this a new or existing patient? Existing   Patient is considered a/an Not Applicable   Race African-American/Black     Patient Assistance   Location of Patient Assistance Not Applicable   Patient's financial/insurance status Low Income;Medicaid   Uninsured Patient No   Patient referred to apply for the following financial assistance Not Applicable   Food insecurities addressed Not Applicable   Transportation assistance No   Assistance securing medications No   Educational health offerings Diabetes;Chronic disease;Hypertension;Navigating the healthcare system     Encounter Details   Primary purpose of visit Chronic Illness/Condition Visit;Education/Health Concerns;Spiritual Care/Support Visit;Navigating the Healthcare System   Was an Emergency Department visit averted? Not Applicable   Does patient have a medical provider? Yes   Patient referred to Emergency Department   Was a mental health screening completed? (GAINS tool) No   Does patient have dental issues? No   Does patient have vision issues? No   Does your patient have an abnormal blood pressure today? Yes   Since previous encounter, have you referred patient for abnormal blood pressure that resulted in a new diagnosis or medication change? No   Does your patient have an abnormal blood glucose today? No   Since previous encounter, have you referred patient for abnormal blood glucose that resulted in a new diagnosis or medication change? No   Was there a life-saving intervention made? No          Amb Nursing Assessment - 06/05/16 0944      Pre-visit preparation   Pre-visit preparation completed Yes     Pain Assessment   Pain Assessment No/denies pain     Nutrition Screen   BMI - recorded 37.1   Nutritional Risks None   Diabetes Yes   CBG done? Yes   CBG resulted in Enter/ Edit results? Yes   Did pt. bring in CBG monitor from home? No     Functional Status   Activities of Daily Living Independent   Ambulation Independent   Medication Administration Independent   Home Management Independent     Risk/Barriers  Assessment   Barriers to Care Management & Learning Motor deficits   Sensory deficits Vision     Abuse/Neglect Assessment   Do you feel unsafe in your current relationship? No   Do you feel physically threatened by others? No   Anyone hurting you at home, work, or school? No   Unable to ask? No   Information provided on Community resources No     Patient Literacy   How often do you need to have someone help you when you read instructions, pamphlets, or other written materials from your doctor or pharmacy? 1 - Never   What is the last grade level you completed in school? 12th Grade     Language Assistant   Interpreter Needed? No     Comments   Information entered by : Angelina Ok, RN 06/05/2016 9:46 AM     B/P check.  160/120.  Client refused to go to the ED.  Instructed client to sit in the lobby for about 20 minutes to cool down and I would retake.  25 minutes later B/P was 180/118.  Client still refuses to go to the ED.  Insist on returning to his apartment  Client states he would take the bus and did not have to walk a long distance.  Discussed with client s/s of stroke and instructions.  Encouraged client to return to clinic next week for a follow up.  He agreed

## 2016-06-18 NOTE — Congregational Nurse Program (Signed)
Congregational Nurse Program Note  Date of Encounter: 06/08/2016  Past Medical History: Past Medical History:  Diagnosis Date  . 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 Diabetes mellitus   . High cholesterol   . Hypertension   . Substance abuse    cocaine. Stopped in 2010  . Tobacco use disorder 10/07/2014    Encounter Details:     CNP Questionnaire - 06/08/16 1522      Patient Demographics   Is this a new or existing patient? Existing   Patient is considered a/an Not Applicable   Race African-American/Black     Patient Assistance   Location of Patient Assistance Not Applicable   Patient's financial/insurance status Low Income;Medicaid   Uninsured Patient No   Patient referred to apply for the following financial assistance Not Applicable   Food insecurities addressed Not Applicable   Transportation assistance No   Assistance securing medications No   Educational health offerings Diabetes;Chronic disease;Hypertension;Navigating the healthcare system     Encounter Details   Primary purpose of visit Chronic Illness/Condition Visit;Education/Health Concerns;Spiritual Care/Support Visit;Navigating the Healthcare System   Was an Emergency Department visit averted? Not Applicable   Does patient have a medical provider? Yes   Patient referred to Emergency Department   Was a mental health screening completed? (GAINS tool) No   Does patient have dental issues? No   Does patient have vision issues? No   Does your patient have an abnormal blood pressure today? Yes   Since previous encounter, have you referred patient for abnormal blood pressure that resulted in a new diagnosis or medication change? No   Does your patient have an abnormal blood glucose today? No   Since previous encounter, have you referred patient for abnormal blood glucose that resulted in a new diagnosis or medication change? No   Was there a life-saving intervention made? No          Amb Nursing Assessment - 06/05/16 0944      Pre-visit preparation   Pre-visit preparation completed Yes     Pain Assessment   Pain Assessment No/denies pain     Nutrition Screen   BMI - recorded 37.1   Nutritional Risks None   Diabetes Yes   CBG done? Yes   CBG resulted in Enter/ Edit results? Yes   Did pt. bring in CBG monitor from home? No     Functional Status   Activities of Daily Living Independent   Ambulation Independent   Medication Administration Independent   Home Management Independent     Risk/Barriers  Assessment   Barriers to Care Management & Learning Motor deficits   Sensory deficits Vision     Abuse/Neglect Assessment   Do you feel unsafe in your current relationship? No   Do you feel physically threatened by others? No   Anyone hurting you at home, work, or school? No   Unable to ask? No   Information provided on Community resources No     Patient Literacy   How often do you need to have someone help you when you read instructions, pamphlets, or other written materials from your doctor or pharmacy? 1 - Never   What is the last grade level you completed in school? 12th Grade     Language Assistant   Interpreter Needed? No     Comments   Information entered by : Angelina Ok, RN 06/05/2016 9:46 AM     Clinic visit to  re-check B/P.  200/120.  States was seen by his PCP and the Lisinopril was increased to 40 mg /day.  Stated had not increased the medication as he did not see his regular MD>  Discussed with client the importance of increasing his Lisinopril as prescribed due to the severity of his reading.  He agreed that he would take as prescribed.  Instructed client to return to clinic for re-check

## 2016-06-18 NOTE — Congregational Nurse Program (Signed)
Congregational Nurse Program Note  Date of Encounter: 06/13/2016  Past Medical History: Past Medical History:  Diagnosis Date  . 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 Diabetes mellitus   . High cholesterol   . Hypertension   . Substance abuse    cocaine. Stopped in 2010  . Tobacco use disorder 10/07/2014    Encounter Details:     CNP Questionnaire - 06/15/16 1528      Patient Demographics   Is this a new or existing patient? Existing   Patient is considered a/an Not Applicable   Race African-American/Black     Patient Assistance   Location of Patient Assistance Not Applicable   Patient's financial/insurance status Low Income;Medicaid   Uninsured Patient No   Patient referred to apply for the following financial assistance Not Applicable   Food insecurities addressed Not Applicable   Transportation assistance No   Assistance securing medications No   Educational health offerings Diabetes;Chronic disease;Hypertension;Navigating the healthcare system     Encounter Details   Primary purpose of visit Chronic Illness/Condition Visit;Education/Health Concerns;Spiritual Care/Support Visit;Navigating the Healthcare System   Was an Emergency Department visit averted? Yes   Does patient have a medical provider? Yes   Patient referred to Clinic   Was a mental health screening completed? (GAINS tool) No   Does patient have dental issues? No   Does patient have vision issues? No   Does your patient have an abnormal blood pressure today? Yes   Since previous encounter, have you referred patient for abnormal blood pressure that resulted in a new diagnosis or medication change? No   Does your patient have an abnormal blood glucose today? No   Since previous encounter, have you referred patient for abnormal blood glucose that resulted in a new diagnosis or medication change? No   Was there a life-saving intervention made? No         Amb Nursing Assessment -  06/05/16 0944      Pre-visit preparation   Pre-visit preparation completed Yes     Pain Assessment   Pain Assessment No/denies pain     Nutrition Screen   BMI - recorded 37.1   Nutritional Risks None   Diabetes Yes   CBG done? Yes   CBG resulted in Enter/ Edit results? Yes   Did pt. bring in CBG monitor from home? No     Functional Status   Activities of Daily Living Independent   Ambulation Independent   Medication Administration Independent   Home Management Independent     Risk/Barriers  Assessment   Barriers to Care Management & Learning Motor deficits   Sensory deficits Vision     Abuse/Neglect Assessment   Do you feel unsafe in your current relationship? No   Do you feel physically threatened by others? No   Anyone hurting you at home, work, or school? No   Unable to ask? No   Information provided on Community resources No     Patient Literacy   How often do you need to have someone help you when you read instructions, pamphlets, or other written materials from your doctor or pharmacy? 1 - Never   What is the last grade level you completed in school? 12th Grade     Language Assistant   Interpreter Needed? No     Comments   Information entered by : Angelina Ok, RN 06/05/2016 9:46 AM     Re-check of B/P.  140/88.  Significantly improved.  Client states he has been taking his lisinopril as prescribed.

## 2016-06-28 ENCOUNTER — Encounter: Payer: Medicaid Other | Admitting: Internal Medicine

## 2016-07-05 ENCOUNTER — Encounter: Payer: Self-pay | Admitting: Internal Medicine

## 2016-07-05 ENCOUNTER — Ambulatory Visit (INDEPENDENT_AMBULATORY_CARE_PROVIDER_SITE_OTHER): Payer: Medicaid Other | Admitting: Podiatry

## 2016-07-05 ENCOUNTER — Ambulatory Visit (INDEPENDENT_AMBULATORY_CARE_PROVIDER_SITE_OTHER): Payer: Medicaid Other | Admitting: Internal Medicine

## 2016-07-05 ENCOUNTER — Encounter: Payer: Self-pay | Admitting: Podiatry

## 2016-07-05 VITALS — BP 136/82 | HR 84 | Temp 98.0°F | Ht 68.0 in | Wt 246.4 lb

## 2016-07-05 DIAGNOSIS — E785 Hyperlipidemia, unspecified: Secondary | ICD-10-CM | POA: Diagnosis not present

## 2016-07-05 DIAGNOSIS — I10 Essential (primary) hypertension: Secondary | ICD-10-CM

## 2016-07-05 DIAGNOSIS — F17210 Nicotine dependence, cigarettes, uncomplicated: Secondary | ICD-10-CM

## 2016-07-05 DIAGNOSIS — Z7982 Long term (current) use of aspirin: Secondary | ICD-10-CM

## 2016-07-05 DIAGNOSIS — Z7984 Long term (current) use of oral hypoglycemic drugs: Secondary | ICD-10-CM | POA: Diagnosis not present

## 2016-07-05 DIAGNOSIS — F1010 Alcohol abuse, uncomplicated: Secondary | ICD-10-CM

## 2016-07-05 DIAGNOSIS — I208 Other forms of angina pectoris: Secondary | ICD-10-CM | POA: Diagnosis not present

## 2016-07-05 DIAGNOSIS — Z79899 Other long term (current) drug therapy: Secondary | ICD-10-CM

## 2016-07-05 DIAGNOSIS — E1136 Type 2 diabetes mellitus with diabetic cataract: Secondary | ICD-10-CM | POA: Diagnosis present

## 2016-07-05 DIAGNOSIS — I110 Hypertensive heart disease with heart failure: Secondary | ICD-10-CM | POA: Diagnosis not present

## 2016-07-05 DIAGNOSIS — F1020 Alcohol dependence, uncomplicated: Secondary | ICD-10-CM

## 2016-07-05 DIAGNOSIS — F17200 Nicotine dependence, unspecified, uncomplicated: Secondary | ICD-10-CM

## 2016-07-05 DIAGNOSIS — B351 Tinea unguium: Secondary | ICD-10-CM

## 2016-07-05 DIAGNOSIS — E119 Type 2 diabetes mellitus without complications: Secondary | ICD-10-CM

## 2016-07-05 DIAGNOSIS — I5042 Chronic combined systolic (congestive) and diastolic (congestive) heart failure: Secondary | ICD-10-CM

## 2016-07-05 DIAGNOSIS — M79676 Pain in unspecified toe(s): Secondary | ICD-10-CM

## 2016-07-05 DIAGNOSIS — Z299 Encounter for prophylactic measures, unspecified: Secondary | ICD-10-CM

## 2016-07-05 LAB — GLUCOSE, CAPILLARY: Glucose-Capillary: 180 mg/dL — ABNORMAL HIGH (ref 65–99)

## 2016-07-05 NOTE — Progress Notes (Signed)
    CC: HTN  HPI: Mr.Brady Church is a 55 y.o. male with PMHx of HTN, T2DM, Chronic Combined CHF, Chronic Stable Angina who presents to the clinic for follow up for HTN and T2DM. Please see problem based charting for more information.  Past Medical History:  Diagnosis Date  . 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 Diabetes mellitus   . High cholesterol   . Hypertension   . Substance abuse    cocaine. Stopped in 2010  . Tobacco use disorder 10/07/2014    Review of Systems: A complete ROS was negative except as noted in HPI.   Physical Exam: Vitals:   07/05/16 1345  BP: 136/82  Pulse: 84  Temp: 98 F (36.7 C)  TempSrc: Oral  SpO2: 100%  Weight: 246 lb 6.4 oz (111.8 kg)  Height: 5\' 8"  (1.727 m)   General: Vital signs reviewed.  Patient is disheveled, unkempt, in no acute distress and cooperative with exam.  Neck: Supple, trachea midline, no carotid bruit present.  Cardiovascular: RRR, S1 normal, S2 normal, no murmurs, gallops, or rubs. Pulmonary/Chest: Clear to auscultation bilaterally, no wheezes, rales, or rhonchi. Abdominal: Soft, non-tender, non-distended, BS + Extremities: No lower extremity edema bilaterally, pulses symmetric and intact bilaterally.  Neurological:Sensory intact to light touch bilaterally.  Skin: Warm, dry and intact. No rashes or erythema. Psychiatric: Normal mood and affect. speech and behavior is normal. Cognition and memory are normal.   Assessment & Plan:  See encounters tab for problem based medical decision making. Patient discussed with Dr. Criselda Peaches.

## 2016-07-05 NOTE — Assessment & Plan Note (Addendum)
Patient's symptoms have been well controlled on ASA 81 mg daily, Coreg 6.25 mg daily, Lovastatin 40 mg daily and Lisinopril 40 mg daily.   Plan: -Continue current regimen -Given ASCVD risk score of 33%, consider high intensity statin at follow up visit- could send to [facility] outpatient pharmacy

## 2016-07-05 NOTE — Progress Notes (Signed)
Patient ID: Brady Church, male   DOB: 1961/10/17, 55 y.o.   MRN: [id number]   Subjective: Today this patient presents for a scheduled visit complaining of painful, elongated and thickened toenails and requests toenail debridement   Objective:  Vascular: No peripheral edema noted bilaterally DP and PT pulses 2/4 bilaterally Capillary reflex immediate bilaterally  Neurological: Sensation to 10 g monofilament wire intact 5/5 bilaterally Vibratory sensation reactive bilaterally Ankle reflex equal and reactive bilaterally  Dermatological: Texture and turgor within normal limits bilaterally No skin lesions noted bilaterally The toenails are incurvated, elongated, discolored with texture and color changes in palpable tenderness 6-10  Musculoskeletal: Patient walks slowly with assistance of cane for visual impairment Hammertoe deformity second bilaterally There is no restriction ankle, subtalar, midtarsal joints bilaterally   Assessment: Symptomatic onychomycoses 6-10  Diabetic without complications   Plan: Debridement toenails 6-10 mechanical and  electrically without any bleeding  Reappoint 3 months

## 2016-07-05 NOTE — Assessment & Plan Note (Signed)
Patient reports drinking an average of one 40 oz beer per day. \  -Counseled on cutting back

## 2016-07-05 NOTE — Assessment & Plan Note (Addendum)
Patient is on moderate intensity statin.   Plan: -Continue Lovastatin 40 mg daily -Given ASCVD risk score of 33%, discuss initiation of high intensity statin

## 2016-07-05 NOTE — Assessment & Plan Note (Signed)
Lab Results  Component Value Date   HGBA1C 6.4 06/05/2016   HGBA1C 6.1 03/01/2016   HGBA1C 6.2 11/03/2015     Assessment: Diabetes control:  Controlled Progress toward A1C goal:   At goal Comments: Compliant with Metformin 1000 mg BID.   Plan: Medications:  continue current medications Instruction/counseling given: reminded to bring blood glucose meter & log to each visit and discussed foot careOther plans: Follow up in 3 months

## 2016-07-05 NOTE — Patient Instructions (Signed)
Diabetes and Foot Care Diabetes may cause you to have problems because of poor blood supply (circulation) to your feet and legs. This may cause the skin on your feet to become thinner, break easier, and heal more slowly. Your skin may become dry, and the skin may peel and crack. You may also have nerve damage in your legs and feet causing decreased feeling in them. You may not notice minor injuries to your feet that could lead to infections or more serious problems. Taking care of your feet is one of the most important things you can do for yourself.  HOME CARE INSTRUCTIONS  Wear shoes at all times, even in the house. Do not go barefoot. Bare feet are easily injured.  Check your feet daily for blisters, cuts, and redness. If you cannot see the bottom of your feet, use a mirror or ask someone for help.  Wash your feet with warm water (do not use hot water) and mild soap. Then pat your feet and the areas between your toes until they are completely dry. Do not soak your feet as this can dry your skin.  Apply a moisturizing lotion or petroleum jelly (that does not contain alcohol and is unscented) to the skin on your feet and to dry, brittle toenails. Do not apply lotion between your toes.  Trim your toenails straight across. Do not dig under them or around the cuticle. File the edges of your nails with an emery board or nail file.  Do not cut corns or calluses or try to remove them with medicine.  Wear clean socks or stockings every day. Make sure they are not too tight. Do not wear knee-high stockings since they may decrease blood flow to your legs.  Wear shoes that fit properly and have enough cushioning. To break in new shoes, wear them for just a few hours a day. This prevents you from injuring your feet. Always look in your shoes before you put them on to be sure there are no objects inside.  Do not cross your legs. This may decrease the blood flow to your feet.  If you find a minor scrape,  cut, or break in the skin on your feet, keep it and the skin around it clean and dry. These areas may be cleansed with mild soap and water. Do not cleanse the area with peroxide, alcohol, or iodine.  When you remove an adhesive bandage, be sure not to damage the skin around it.  If you have a wound, look at it several times a day to make sure it is healing.  Do not use heating pads or hot water bottles. They may burn your skin. If you have lost feeling in your feet or legs, you may not know it is happening until it is too late.  Make sure your health care provider performs a complete foot exam at least annually or more often if you have foot problems. Report any cuts, sores, or bruises to your health care provider immediately. SEEK MEDICAL CARE IF:   You have an injury that is not healing.  You have cuts or breaks in the skin.  You have an ingrown nail.  You notice redness on your legs or feet.  You feel burning or tingling in your legs or feet.  You have pain or cramps in your legs and feet.  Your legs or feet are numb.  Your feet always feel cold. SEEK IMMEDIATE MEDICAL CARE IF:   There is increasing redness,   swelling, or pain in or around a wound.  There is a red line that goes up your leg.  Pus is coming from a wound.  You develop a fever or as directed by your health care provider.  You notice a bad smell coming from an ulcer or wound.   This information is not intended to replace advice given to you by your health care provider. Make sure you discuss any questions you have with your health care provider.   Document Released: 11/10/2000 Document Revised: 07/16/2013 Document Reviewed: 04/22/2013 Elsevier Interactive Patient Education 2016 [employer]  

## 2016-07-05 NOTE — Assessment & Plan Note (Signed)
Needs repeat colonoscopy in 2027

## 2016-07-05 NOTE — Assessment & Plan Note (Addendum)
BP Readings from Last 3 Encounters:  07/05/16 136/82  06/13/16 140/88  06/08/16 (!) 200/120    Lab Results  Component Value Date   NA 140 03/01/2016   K 4.1 03/01/2016   CREATININE 0.87 03/01/2016    Assessment: Blood pressure control:  At goal Progress toward BP goal:   Improved Comments: Patient was recently increased to Lisinopril 40 mg daily from 20 mg daily, and continued on HCTZ 12.5 mg daily and Coreg 6.25 mg BID. Recordings from outpatient congregation nurse shows good outpatient control.   Plan: Medications:  continue current medications Other plans: Follow up in 3 months, Repeat BMET and CBC today.

## 2016-07-05 NOTE — Assessment & Plan Note (Signed)
Patient is euvolemic on examination and denies symptoms of acute CHF exacerbation. Patient is on ASA 81 mg daily, Lovastatin 40 mg daily, Coreg 6.25 mg daily, and HCTZ 12.5 mg daily.   Plan: -Continue current regimen -If symptoms become present, consider switching HCTZ for Lasix

## 2016-07-05 NOTE — Assessment & Plan Note (Signed)
  Assessment: Progress toward smoking cessation:   Stagnant Barriers to progress toward smoking cessation:   Desire Comments: Smoking 7-8 cigarettes per day  Plan: Instruction/counseling given:  I counseled patient on the dangers of tobacco use, advised patient to stop smoking, and reviewed strategies to maximize success. Educational resources provided:    Self management tools provided:    Medications to assist with smoking cessation:  None Patient agreed to the following self-care plans for smoking cessation: set a quit date and stop smoking  Other plans: Continue to discuss

## 2016-07-05 NOTE — Patient Instructions (Addendum)
CONTINUE ALL MEDICATIONS AS PRESCRIBED.  YOU ARE DOING WELL! YOUR BLOOD PRESSURE AND DIABETES ARE WELL CONTROLLED.   REMEMBER TO GET YOUR EYE EXAM  CONTINUE WORK ON QUITTING SMOKING  FOLLOW UP IN 3 MONTHS.

## 2016-07-06 LAB — BMP8+ANION GAP
Anion Gap: 21 mmol/L — ABNORMAL HIGH (ref 10.0–18.0)
BUN / CREAT RATIO: 11 (ref 9–20)
BUN: 13 mg/dL (ref 6–24)
CO2: 21 mmol/L (ref 18–29)
Calcium: 9.2 mg/dL (ref 8.7–10.2)
Chloride: 100 mmol/L (ref 96–106)
Creatinine, Ser: 1.14 mg/dL (ref 0.76–1.27)
GFR, EST AFRICAN AMERICAN: 84 mL/min/{1.73_m2} (ref >59)
GFR, EST NON AFRICAN AMERICAN: 73 mL/min/{1.73_m2} (ref >59)
Glucose: 164 mg/dL — ABNORMAL HIGH (ref 65–99)
POTASSIUM: 3.7 mmol/L (ref 3.5–5.2)
SODIUM: 142 mmol/L (ref 134–144)

## 2016-07-06 LAB — CBC
Hematocrit: 40.1 % (ref 37.5–51.0)
Hemoglobin: 13.7 g/dL (ref 12.6–17.7)
MCH: 33.1 pg — AB (ref 26.6–33.0)
MCHC: 34.2 g/dL (ref 31.5–35.7)
MCV: 97 fL (ref 79–97)
PLATELETS: 267 10*3/uL (ref 150–379)
RBC: 4.14 x10E6/uL (ref 4.14–5.80)
RDW: 14.8 % (ref 12.3–15.4)
WBC: 5.8 10*3/uL (ref 3.4–10.8)

## 2016-07-07 NOTE — Progress Notes (Signed)
Internal Medicine Clinic Attending  Case discussed with Dr. Burns soon after the resident saw the patient.  We reviewed the resident's history and exam and pertinent patient test results.  I agree with the assessment, diagnosis, and plan of care documented in the resident's note. 

## 2016-07-10 ENCOUNTER — Encounter: Payer: Self-pay | Admitting: Internal Medicine

## 2016-07-10 ENCOUNTER — Ambulatory Visit (INDEPENDENT_AMBULATORY_CARE_PROVIDER_SITE_OTHER): Payer: Medicaid Other | Admitting: Internal Medicine

## 2016-07-10 VITALS — BP 152/97 | HR 71 | Temp 97.8°F | Ht 68.0 in | Wt 246.3 lb

## 2016-07-10 DIAGNOSIS — I10 Essential (primary) hypertension: Secondary | ICD-10-CM

## 2016-07-10 DIAGNOSIS — E785 Hyperlipidemia, unspecified: Secondary | ICD-10-CM

## 2016-07-10 DIAGNOSIS — Z79899 Other long term (current) drug therapy: Secondary | ICD-10-CM | POA: Diagnosis not present

## 2016-07-10 DIAGNOSIS — F17210 Nicotine dependence, cigarettes, uncomplicated: Secondary | ICD-10-CM | POA: Diagnosis not present

## 2016-07-10 DIAGNOSIS — H938X3 Other specified disorders of ear, bilateral: Secondary | ICD-10-CM

## 2016-07-10 DIAGNOSIS — H938X9 Other specified disorders of ear, unspecified ear: Secondary | ICD-10-CM | POA: Insufficient documentation

## 2016-07-10 MED ORDER — LOVASTATIN 20 MG PO TABS: 40.0000 mg | ORAL_TABLET | Freq: Every day | ORAL | 11 refills | Status: DC

## 2016-07-10 NOTE — Assessment & Plan Note (Signed)
Patient states his friends feel that he has been talking loudly and he is concerned that his ears might be clogged with wax and need to be cleaned. He denies any decreased hearing, lightheadedness, ear fullness or or sinus congestion. Physical exam shows normal external canals, no evidence of cerumen, normal tympanic membranes bilaterally.   Assessment: Normal External Auditory Canal without cerumen. Hearing is grossly normal. Patient is not interested in a hearing test at this time.   Plan: -Continue to monitor

## 2016-07-10 NOTE — Progress Notes (Signed)
Internal Medicine Clinic Attending  Case discussed with Dr. Burns soon after the resident saw the patient.  We reviewed the resident's history and exam and pertinent patient test results.  I agree with the assessment, diagnosis, and plan of care documented in the resident's note. 

## 2016-07-10 NOTE — Progress Notes (Signed)
    CC: ears stopped up  HPI: Mr.Brady Church is a 55 y.o. male with PMHx of stable angina, NICM, HTN, and HLD who presents to the clinic for thinking his "ears are stopped up."  Patient states his friends feel that he has been talking loudly and he is concerned that his ears might be clogged with wax and need to be cleaned. He denies any decreased hearing, lightheadedness, ear fullness or or sinus congestion.   Past Medical History:  Diagnosis Date  . 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 Diabetes mellitus   . High cholesterol   . Hypertension   . Substance abuse    cocaine. Stopped in 2010  . Tobacco use disorder 10/07/2014    Review of Systems: A complete ROS was negative except as noted in HPI.   Physical Exam: Vitals:   07/10/16 0952 07/10/16 1010  BP: (!) 179/107 (!) 152/97  Pulse: 71   Temp: 97.8 F (36.6 C)   TempSrc: Oral   SpO2: 100%   Weight: 246 lb 4.8 oz (111.7 kg)   Height: 5\' 8"  (1.727 m)    General: Vital signs reviewed.  Patient is in no acute distress and cooperative with exam.  Ears: Normal external canals, no evidence of cerumen, normal tympanic membranes bilaterally.  Cardiovascular: RRR, S1 normal, S2 normal, no murmurs, gallops, or rubs. Pulmonary/Chest: Clear to auscultation bilaterally, no wheezes, rales, or rhonchi. Abdominal: Soft, non-tender, non-distended, BS + Skin: Warm, dry and intact. No rashes or erythema. Psychiatric: Normal mood and affect. speech and behavior is normal. Cognition and memory are normal.   Assessment & Plan:  See encounters tab for problem based medical decision making. Patient discussed with Dr. Heide Spark

## 2016-07-10 NOTE — Assessment & Plan Note (Signed)
Discussed initiation of high intensity statin; however, patient already has difficulty affording his $3 dollar copay for lovastatin at [facility] which is moderate intensity. He prefers to continue lovastatin for now.

## 2016-07-10 NOTE — Assessment & Plan Note (Addendum)
BP Readings from Last 3 Encounters:  07/10/16 (!) 152/97  07/05/16 136/82  06/13/16 140/88    Lab Results  Component Value Date   NA 142 07/05/2016   K 3.7 07/05/2016   CREATININE 1.14 07/05/2016    Assessment: Blood pressure control:  Uncontrolled Progress toward BP goal:   Deteriorated Comments: Patient is normally well controlled on HCTZ 12.5 mg daily, Coreg 6.25 mg BID and Lisinopril 40 mg daily. He admits to missing a dose last week and a high salt diet last night and this morning. He denies headache, shortness of breath or lightheadedness.  Plan: Medications:  Continue above medications.  Other plans: Follow up with congregational nurse. Return to clinic if BP remains >140/90.

## 2016-07-10 NOTE — Patient Instructions (Signed)
CONTINUE TAKING ALL MEDICATIONS AS PRESCRIBED.  IF YOU BLOOD PRESSURE CONTINUES TO BE ELEVATED, PLEASE RETURN TO THE CLINIC FOR FOLLOW UP.

## 2016-07-26 ENCOUNTER — Ambulatory Visit: Payer: Medicaid Other | Admitting: Podiatry

## 2016-07-26 ENCOUNTER — Other Ambulatory Visit: Payer: Self-pay | Admitting: Internal Medicine

## 2016-07-26 DIAGNOSIS — I208 Other forms of angina pectoris: Secondary | ICD-10-CM

## 2016-07-26 DIAGNOSIS — I2089 Other forms of angina pectoris: Secondary | ICD-10-CM

## 2016-08-11 LAB — GLUCOSE, POCT (MANUAL RESULT ENTRY): POC GLUCOSE: 183 mg/dL — AB (ref 70–99)

## 2016-08-16 ENCOUNTER — Telehealth: Payer: Self-pay | Admitting: *Deleted

## 2016-08-16 NOTE — Telephone Encounter (Signed)
Call from [facility] took pt's bp and measured it at 180/120 and pt has taken his meds.  Pt has heart failure, HTN, and DM. The nurse/tech is requesting an appt for pt  To be seen for his bp and dm-pt refused an appt for today, but states he can come tomorrow.  Pt to call clinic or go to ED should he experience any shortness of breath, dizziness, headache, blurred vision, chest pain, confusion or swelling.  Pt again offered an appt for today, but he declined, phone call complete.Brady Meir Cassady9/20/201711:22 AM  Pt's glucose was 172 post prandial

## 2016-08-16 NOTE — Telephone Encounter (Signed)
Agree with appt. 

## 2016-08-17 ENCOUNTER — Ambulatory Visit: Payer: Medicaid Other

## 2016-09-06 ENCOUNTER — Ambulatory Visit (INDEPENDENT_AMBULATORY_CARE_PROVIDER_SITE_OTHER): Payer: Medicaid Other | Admitting: Podiatry

## 2016-09-06 ENCOUNTER — Encounter: Payer: Self-pay | Admitting: Podiatry

## 2016-09-06 VITALS — BP 169/110 | HR 80 | Resp 18

## 2016-09-06 DIAGNOSIS — B351 Tinea unguium: Secondary | ICD-10-CM

## 2016-09-06 DIAGNOSIS — M79676 Pain in unspecified toe(s): Secondary | ICD-10-CM

## 2016-09-06 NOTE — Progress Notes (Signed)
Patient ID: Brady Church, male   DOB: [birth year], 55 y.o.   MRN: [id number]    Subjective: Today this patient presents for a scheduled visit complaining of painful, elongated and thickened toenails and requests toenail debridement   Objective:  Vascular: No peripheral edema noted bilaterally DP and PT pulses 2/4 bilaterally Capillary reflex immediate bilaterally  Neurological: Sensation to 10 g monofilament wire intact 5/5 bilaterally Vibratory sensation reactive bilaterally Ankle reflex equal and reactive bilaterally  Dermatological: Texture and turgor within normal limits bilaterally No skin lesions noted bilaterally The toenails are incurvated, elongated, discolored with texture and color changes in palpable tenderness 6-10  Musculoskeletal: Patient walks slowly with assistance of cane for visual impairment Hammertoe deformity second bilaterally There is no restriction ankle, subtalar, midtarsal joints bilaterally HAV left  Assessment: Symptomatic onychomycoses 6-10 Diabetic without complications  Plan: Debridement toenails 6-10 mechanical and electrically without any bleeding  Reappoint 3 months

## 2016-09-06 NOTE — Patient Instructions (Signed)
Diabetes and Foot Care Diabetes may cause you to have problems because of poor blood supply (circulation) to your feet and legs. This may cause the skin on your feet to become thinner, break easier, and heal more slowly. Your skin may become dry, and the skin may peel and crack. You may also have nerve damage in your legs and feet causing decreased feeling in them. You may not notice minor injuries to your feet that could lead to infections or more serious problems. Taking care of your feet is one of the most important things you can do for yourself.  HOME CARE INSTRUCTIONS  Wear shoes at all times, even in the house. Do not go barefoot. Bare feet are easily injured.  Check your feet daily for blisters, cuts, and redness. If you cannot see the bottom of your feet, use a mirror or ask someone for help.  Wash your feet with warm water (do not use hot water) and mild soap. Then pat your feet and the areas between your toes until they are completely dry. Do not soak your feet as this can dry your skin.  Apply a moisturizing lotion or petroleum jelly (that does not contain alcohol and is unscented) to the skin on your feet and to dry, brittle toenails. Do not apply lotion between your toes.  Trim your toenails straight across. Do not dig under them or around the cuticle. File the edges of your nails with an emery board or nail file.  Do not cut corns or calluses or try to remove them with medicine.  Wear clean socks or stockings every day. Make sure they are not too tight. Do not wear knee-high stockings since they may decrease blood flow to your legs.  Wear shoes that fit properly and have enough cushioning. To break in new shoes, wear them for just a few hours a day. This prevents you from injuring your feet. Always look in your shoes before you put them on to be sure there are no objects inside.  Do not cross your legs. This may decrease the blood flow to your feet.  If you find a minor scrape,  cut, or break in the skin on your feet, keep it and the skin around it clean and dry. These areas may be cleansed with mild soap and water. Do not cleanse the area with peroxide, alcohol, or iodine.  When you remove an adhesive bandage, be sure not to damage the skin around it.  If you have a wound, look at it several times a day to make sure it is healing.  Do not use heating pads or hot water bottles. They may burn your skin. If you have lost feeling in your feet or legs, you may not know it is happening until it is too late.  Make sure your health care provider performs a complete foot exam at least annually or more often if you have foot problems. Report any cuts, sores, or bruises to your health care provider immediately. SEEK MEDICAL CARE IF:   You have an injury that is not healing.  You have cuts or breaks in the skin.  You have an ingrown nail.  You notice redness on your legs or feet.  You feel burning or tingling in your legs or feet.  You have pain or cramps in your legs and feet.  Your legs or feet are numb.  Your feet always feel cold. SEEK IMMEDIATE MEDICAL CARE IF:   There is increasing redness,   swelling, or pain in or around a wound.  There is a red line that goes up your leg.  Pus is coming from a wound.  You develop a fever or as directed by your health care provider.  You notice a bad smell coming from an ulcer or wound.   This information is not intended to replace advice given to you by your health care provider. Make sure you discuss any questions you have with your health care provider.   Document Released: 11/10/2000 Document Revised: 07/16/2013 Document Reviewed: 04/22/2013 Elsevier Interactive Patient Education 2016 [employer]  

## 2016-09-19 ENCOUNTER — Other Ambulatory Visit: Payer: Self-pay | Admitting: Internal Medicine

## 2016-09-20 ENCOUNTER — Encounter: Payer: Self-pay | Admitting: Internal Medicine

## 2016-09-20 ENCOUNTER — Ambulatory Visit (INDEPENDENT_AMBULATORY_CARE_PROVIDER_SITE_OTHER): Payer: Medicaid Other | Admitting: Internal Medicine

## 2016-09-20 VITALS — BP 155/101 | HR 86 | Temp 98.0°F | Ht 68.0 in | Wt 253.0 lb

## 2016-09-20 DIAGNOSIS — Z79899 Other long term (current) drug therapy: Secondary | ICD-10-CM

## 2016-09-20 DIAGNOSIS — R1013 Epigastric pain: Secondary | ICD-10-CM

## 2016-09-20 DIAGNOSIS — F17210 Nicotine dependence, cigarettes, uncomplicated: Secondary | ICD-10-CM | POA: Diagnosis not present

## 2016-09-20 DIAGNOSIS — Z794 Long term (current) use of insulin: Principal | ICD-10-CM

## 2016-09-20 DIAGNOSIS — L259 Unspecified contact dermatitis, unspecified cause: Secondary | ICD-10-CM | POA: Insufficient documentation

## 2016-09-20 DIAGNOSIS — L245 Irritant contact dermatitis due to other chemical products: Secondary | ICD-10-CM

## 2016-09-20 DIAGNOSIS — I10 Essential (primary) hypertension: Secondary | ICD-10-CM | POA: Diagnosis not present

## 2016-09-20 DIAGNOSIS — E1136 Type 2 diabetes mellitus with diabetic cataract: Secondary | ICD-10-CM

## 2016-09-20 DIAGNOSIS — L299 Pruritus, unspecified: Secondary | ICD-10-CM

## 2016-09-20 DIAGNOSIS — K861 Other chronic pancreatitis: Secondary | ICD-10-CM | POA: Insufficient documentation

## 2016-09-20 LAB — POCT GLYCOSYLATED HEMOGLOBIN (HGB A1C): HEMOGLOBIN A1C: 7.5

## 2016-09-20 LAB — GLUCOSE, CAPILLARY: GLUCOSE-CAPILLARY: 248 mg/dL — AB (ref 65–99)

## 2016-09-20 MED ORDER — HYDROCHLOROTHIAZIDE 25 MG PO TABS: 25.0000 mg | ORAL_TABLET | Freq: Every day | ORAL | 11 refills | Status: DC

## 2016-09-20 MED ORDER — CARVEDILOL 6.25 MG PO TABS: 6.2500 mg | ORAL_TABLET | Freq: Two times a day (BID) | ORAL | 11 refills | Status: DC

## 2016-09-20 MED ORDER — LISINOPRIL 40 MG PO TABS: 40.0000 mg | ORAL_TABLET | Freq: Every day | ORAL | 11 refills | Status: DC

## 2016-09-20 MED ORDER — METFORMIN HCL 1000 MG PO TABS: 1000.0000 mg | ORAL_TABLET | Freq: Two times a day (BID) | ORAL | 11 refills | Status: DC

## 2016-09-20 MED ORDER — OMEPRAZOLE 20 MG PO TBEC: 20.0000 mg | DELAYED_RELEASE_TABLET | Freq: Every day | ORAL | 11 refills | Status: DC

## 2016-09-20 NOTE — Assessment & Plan Note (Signed)
Lab Results  Component Value Date   HGBA1C 7.5 09/20/2016   HGBA1C 6.4 06/05/2016   HGBA1C 6.1 03/01/2016     Assessment: Diabetes control:  Uncontrolled Progress toward A1C goal:   Deteriorated Comments: Patient states he did not take his Metformin in the last 1 month as he felt his glucose was in better control. He is willing to restart Metformin 1000 mg BID.   Plan: Medications:  continue current medications Instruction/counseling given: instruction on diet and exercise Other plans: Follow up in one month

## 2016-09-20 NOTE — Assessment & Plan Note (Signed)
BP Readings from Last 3 Encounters:  09/20/16 (!) 155/101  09/06/16 (!) 169/110  08/11/16 (!) 188/120    Lab Results  Component Value Date   NA 142 07/05/2016   K 3.7 07/05/2016   CREATININE 1.14 07/05/2016    Assessment: Blood pressure control:  Uncontrolled Progress toward BP goal:    Deteriorated Comments: Has not taken BP meds in 2 days, however, has been uncontrolled in the past on current regimen. Will increase HCTZ in addition to refills.   Plan: Medications:  Increase HCTZ to 25 mg QD, continue coreg 6.25 mg BID and Lisinopril 40 mg daily.  Other plans: Follow up in one month

## 2016-09-20 NOTE — Progress Notes (Signed)
    CC: HTN, rash  HPI: Mr.Brady Church is a 55 y.o. male with PMHx of HTN, T2DM, and HLD who presents to the clinic for follow up for rash and HTN. Please see problem based assessment and plan for more information.  Patient denies shortness of breath. He admits to rash. He is eating and drinking well.    Past Medical History:  Diagnosis Date  . 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 Diabetes mellitus   . High cholesterol   . Hypertension   . Substance abuse    cocaine. Stopped in 2010  . Tobacco use disorder 10/07/2014    Review of Systems: Please see pertinent ROS reviewed in HPI and problem based charting.   Physical Exam: Vitals:   09/20/16 1428  BP: (!) 155/101  Pulse: 86  Temp: 98 F (36.7 C)  TempSrc: Oral  SpO2: 100%  Weight: 253 lb (114.8 kg)  Height: 5\' 8"  (1.727 m)   General: Vital signs reviewed.  Patient is in no acute distress and cooperative with exam.  Cardiovascular: RRR, S1 normal, S2 normal, no murmurs, gallops, or rubs. Pulmonary/Chest: Clear to auscultation bilaterally, no wheezes, rales, or rhonchi. Abdominal: Soft, non-tender, non-distended, BS +, no organomegaly, or guarding present.  Extremities: Trace lower extremity edema bilaterally Skin: Dry, excoriated, rash on left forearm without raised lesions or plaques most consistent with contact dermatitis.  Assessment & Plan:  See encounters tab for problem based medical decision making. Patient discussed with Dr. Oswaldo Done

## 2016-09-20 NOTE — Patient Instructions (Addendum)
FOR YOUR BLOOD PRESSURE: -TAKE CARVEDILOL TWICE A DAY -TAKE LISINOPRIL ONCE A DAY -TAKE HYDROCHLOROTHIAZIDE 25 MG ONCE A DAY  FOR YOUR RASH: -PUT A REGULAR MOISTURIZER OR VASELINE ON THE AREA  FOR YOUR ABDOMINAL PAIN: -TAKE OMEPRAZOLE 20 MG ONCE A DAY -CUT BACK ON ALCOHOL AND TOBACCO USE -WE WILL CHECK BLOOD WORK TODAY  FOR YOUR DIABETES: -TAKE METFORMIN 1000 MG TWICE A DAY  FOLLOW UP IN ONE MONTH FOR YOUR BLOOD PRESSURE.

## 2016-09-20 NOTE — Assessment & Plan Note (Signed)
Patient admits to a one week history of epigastric, non-radiating, intermittent cramping abdominal pain for the past one week. Pain comes and goes throughout the day and is worse with food intake, especially spicy foods. He does not think fatty foods make it as bad as spicy foods. Only time seems to make it better. At it's best it is a 0/10, at it's worst it is a 6-7/10. He hasn't tried anything for his symptoms. He admits to history of GERD, but cannot find his PPI pills- he thinks they are stored away. He denies associated fever, chills, nausea, vomiting, diarrhea, dysphagia, or blood in his stool. His abdomen is non-tender on exam and bowel sounds are normal. He admits to drinking more alcohol and smoking more cigarettes recently.  Assessment: Epigastric pain likely secondary to gastritis, PUD or GERD. Doubt pancreatitis, cholelithiasis/cholecystitis or hepatitis  Plan: -Decreased tobacco and alcohol  -Take Omeprazole 20 mg QD -Check CMET and lipase

## 2016-09-20 NOTE — Assessment & Plan Note (Signed)
Patient admits to a one week history of pruritic rash on his left forearm. He denies any raised lesions or bumps. He denies pain. No other friends or family has a rash that is similar. He denies rash on any other part of his body. He states he has been using a new disinfectant that he got from the dollar store on his blankets and couch. He admits to history of sensitive skin. Physical exam is most consistent with contact dermatitis.   Plan: -Stop using the disinfectant -Use Vaseline or moisturizer on the area

## 2016-09-21 LAB — CMP14 + ANION GAP
A/G RATIO: 1.6 (ref 1.2–2.2)
ALT: 26 IU/L (ref 0–44)
ANION GAP: 22 mmol/L — AB (ref 10.0–18.0)
AST: 25 IU/L (ref 0–40)
Albumin: 4.5 g/dL (ref 3.5–5.5)
Alkaline Phosphatase: 61 IU/L (ref 39–117)
BUN/Creatinine Ratio: 10 (ref 9–20)
BUN: 10 mg/dL (ref 6–24)
Bilirubin Total: 0.2 mg/dL (ref 0.0–1.2)
CALCIUM: 9.4 mg/dL (ref 8.7–10.2)
CO2: 19 mmol/L (ref 18–29)
CREATININE: 0.98 mg/dL (ref 0.76–1.27)
Chloride: 93 mmol/L — ABNORMAL LOW (ref 96–106)
GFR, EST AFRICAN AMERICAN: 101 mL/min/{1.73_m2} (ref >59)
GFR, EST NON AFRICAN AMERICAN: 88 mL/min/{1.73_m2} (ref >59)
GLOBULIN, TOTAL: 2.8 g/dL (ref 1.5–4.5)
Glucose: 246 mg/dL — ABNORMAL HIGH (ref 65–99)
Potassium: 3.8 mmol/L (ref 3.5–5.2)
SODIUM: 134 mmol/L (ref 134–144)
TOTAL PROTEIN: 7.3 g/dL (ref 6.0–8.5)

## 2016-09-21 LAB — LIPASE: LIPASE: 128 U/L — AB (ref 13–78)

## 2016-09-21 NOTE — Progress Notes (Signed)
Internal Medicine Clinic Attending  I saw and evaluated the patient.  I personally confirmed the key portions of the history and exam documented by Dr. Burns and I reviewed pertinent patient test results.  The assessment, diagnosis, and plan were formulated together and I agree with the documentation in the resident's note. 

## 2016-10-11 ENCOUNTER — Ambulatory Visit (INDEPENDENT_AMBULATORY_CARE_PROVIDER_SITE_OTHER): Payer: Medicaid Other | Admitting: Internal Medicine

## 2016-10-11 ENCOUNTER — Encounter: Payer: Self-pay | Admitting: Internal Medicine

## 2016-10-11 DIAGNOSIS — Z7984 Long term (current) use of oral hypoglycemic drugs: Secondary | ICD-10-CM

## 2016-10-11 DIAGNOSIS — R1013 Epigastric pain: Secondary | ICD-10-CM | POA: Diagnosis not present

## 2016-10-11 DIAGNOSIS — Z79899 Other long term (current) drug therapy: Secondary | ICD-10-CM

## 2016-10-11 DIAGNOSIS — I10 Essential (primary) hypertension: Secondary | ICD-10-CM | POA: Diagnosis present

## 2016-10-11 DIAGNOSIS — F1020 Alcohol dependence, uncomplicated: Secondary | ICD-10-CM

## 2016-10-11 DIAGNOSIS — E1136 Type 2 diabetes mellitus with diabetic cataract: Secondary | ICD-10-CM

## 2016-10-11 DIAGNOSIS — Z794 Long term (current) use of insulin: Principal | ICD-10-CM

## 2016-10-11 DIAGNOSIS — K860 Alcohol-induced chronic pancreatitis: Secondary | ICD-10-CM

## 2016-10-11 DIAGNOSIS — F17210 Nicotine dependence, cigarettes, uncomplicated: Secondary | ICD-10-CM | POA: Diagnosis not present

## 2016-10-11 DIAGNOSIS — E1165 Type 2 diabetes mellitus with hyperglycemia: Secondary | ICD-10-CM | POA: Diagnosis not present

## 2016-10-11 LAB — GLUCOSE, CAPILLARY: GLUCOSE-CAPILLARY: 183 mg/dL — AB (ref 65–99)

## 2016-10-11 MED ORDER — METFORMIN HCL 1000 MG PO TABS: 1000.0000 mg | ORAL_TABLET | Freq: Two times a day (BID) | ORAL | 11 refills | Status: DC

## 2016-10-11 MED ORDER — CARVEDILOL 12.5 MG PO TABS: 12.5000 mg | ORAL_TABLET | Freq: Two times a day (BID) | ORAL | 11 refills | Status: DC

## 2016-10-11 MED ORDER — DORZOLAMIDE HCL-TIMOLOL MAL 2-0.5 % OP SOLN: 1.0000 [drp] | Freq: Two times a day (BID) | OPHTHALMIC | 3 refills | Status: DC

## 2016-10-11 MED ORDER — LISINOPRIL 40 MG PO TABS: 40.0000 mg | ORAL_TABLET | Freq: Every day | ORAL | 11 refills | Status: DC

## 2016-10-11 MED ORDER — DUREZOL 0.05 % OP EMUL: 1.0000 [drp] | Freq: Two times a day (BID) | OPHTHALMIC | 12 refills | Status: DC

## 2016-10-11 MED ORDER — HYDROCHLOROTHIAZIDE 25 MG PO TABS: 25.0000 mg | ORAL_TABLET | Freq: Every day | ORAL | 11 refills | Status: DC

## 2016-10-11 NOTE — Assessment & Plan Note (Signed)
Last lipase was <3 x the ULN at 128. We do not have imaging to show calcifications of the pancreas nor has he been treated for pancreatitis in the past; however, patient may have underlying chronic pancreatitis as he reports recurrent epigastric pain in the past. It is now resolved. We discussed management including the need to cut back on alcohol and eventually quit along with tobacco cessation. Patient may require Pancreatic enzymes replacement in the future.

## 2016-10-11 NOTE — Patient Instructions (Signed)
MR. Straka,  PLEASE CONTINUE TO CUT BACK ON YOUR ALCOHOL USE (BEER AND LIQUOR).   CONTINUE ALL MEDICATIONS AS PRESCRIBED.  FOLLOW UP IN 2-3 MONTHS.

## 2016-10-11 NOTE — Assessment & Plan Note (Signed)
BP Readings from Last 3 Encounters:  10/11/16 (!) 150/93  09/20/16 (!) 155/101  09/06/16 (!) 169/110    Lab Results  Component Value Date   NA 134 09/20/2016   K 3.8 09/20/2016   CREATININE 0.98 09/20/2016    Assessment: Blood pressure control:  Uncontrolled Progress toward BP goal:   Stagnant Comments: Patient reports compliance with HCTZ 25 mg daily and Lisinopril 40 mg daily. His Coreg bottle is not present. I'm concerned that due to financial difficulties he is not taking his medications regularly in order to space them out. He reports they are $3 dollars each at [employer] with his [employer].   Plan: Medications:  I refilled and increased Coreg 12.5 mg BID, refilled lisinopril 40 mg daily and continued HCTZ 25 mg QD. Other plans: Follow up in 2 months

## 2016-10-11 NOTE — Progress Notes (Signed)
    CC: Follow up for HTN  HPI: Mr.Brady Church is a 55 y.o. male with PMHx of HTN and T2DM who presents to the clinic for follow up for HTN. Please see problem oriented assessment and plan for more information.   Patient is eating and drinking well. He denies increased chest pain or shortness of breath.   Past Medical History:  Diagnosis Date  . 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 Diabetes mellitus   . High cholesterol   . Hypertension   . Substance abuse    cocaine. Stopped in 2010  . Tobacco use disorder 10/07/2014    Review of Systems: Please see pertinent ROS reviewed in HPI and problem based charting.   Physical Exam: Vitals:   10/11/16 0929  BP: (!) 150/93  Pulse: 89  Temp: 97.5 F (36.4 C)  TempSrc: Oral  SpO2: 100%  Weight: 247 lb 11.2 oz (112.4 kg)   General: Vital signs reviewed.  Patient is unkempt, disheveled, in no acute distress and cooperative with exam.  Cardiovascular: RRR, S1 normal, S2 normal, no murmurs, gallops, or rubs. Pulmonary/Chest: Clear to auscultation bilaterally, no wheezes, rales, or rhonchi. Abdominal: Soft, non-tender, non-distended, BS + Extremities: No lower extremity edema bilaterally Psychiatric: Normal mood and affect. speech and behavior is normal. Cognition and memory are normal.   Assessment & Plan:  See encounters tab for problem based medical decision making. Patient discussed with Dr. Cyndie Chime

## 2016-10-11 NOTE — Progress Notes (Signed)
Medicine attending: Medical history, presenting problems, physical findings, and medications, reviewed with resident physician Dr Alexa Burns on the day of the patient visit and I concur with her evaluation and management plan. 

## 2016-10-11 NOTE — Assessment & Plan Note (Signed)
CBG 183. Patient reports compliance with Metformin 1000 mg BID.   Plan: -Continue Metformin -Repeat A1c in 2 months

## 2016-11-29 ENCOUNTER — Ambulatory Visit (INDEPENDENT_AMBULATORY_CARE_PROVIDER_SITE_OTHER): Payer: Medicaid Other | Admitting: Podiatry

## 2016-11-29 ENCOUNTER — Encounter: Payer: Self-pay | Admitting: Podiatry

## 2016-11-29 VITALS — BP 110/77 | HR 78 | Resp 18

## 2016-11-29 DIAGNOSIS — B351 Tinea unguium: Secondary | ICD-10-CM

## 2016-11-29 DIAGNOSIS — M79676 Pain in unspecified toe(s): Secondary | ICD-10-CM

## 2016-11-29 NOTE — Patient Instructions (Signed)

## 2016-11-30 NOTE — Progress Notes (Signed)
Patient ID: Brady Church, male   DOB: [birth year], 56 y.o.   MRN: [id number]    Subjective: Today this patient presents for a scheduled visit complaining of painful, elongated and thickened toenails and requests toenail debridement   Objective:  Vascular: No peripheral edema noted bilaterally DP and PT pulses 2/4 bilaterally Capillary reflex immediate bilaterally  Neurological: Sensation to 10 g monofilament wire intact 5/5 bilaterally Vibratory sensation reactive bilaterally Ankle reflex equal and reactive bilaterally  Dermatological: Texture and turgor within normal limits bilaterally No skin lesions noted bilaterally The toenails are incurvated, elongated, discolored with texture and color changes in palpable tenderness 6-10  Musculoskeletal: Patient walks slowly with assistance of cane for visual impairment Hammertoe deformity second bilaterally There is no restriction ankle, subtalar, midtarsal joints bilaterally HAV left  Assessment: Symptomatic onychomycoses 6-10 Diabetic without complications  Plan: Debridement toenails 6-10 mechanical and electrically without any bleeding  Reappoint 3 months

## 2016-12-06 ENCOUNTER — Ambulatory Visit: Payer: Medicaid Other | Admitting: Podiatry

## 2017-01-02 ENCOUNTER — Telehealth: Payer: Self-pay | Admitting: Internal Medicine

## 2017-01-02 NOTE — Telephone Encounter (Signed)
APT. REMINDER CALL, LMTCB °

## 2017-01-03 ENCOUNTER — Encounter: Payer: Medicaid Other | Admitting: Internal Medicine

## 2017-02-16 ENCOUNTER — Inpatient Hospital Stay (HOSPITAL_COMMUNITY): Payer: Medicaid Other

## 2017-02-16 ENCOUNTER — Emergency Department (HOSPITAL_COMMUNITY): Payer: Medicaid Other

## 2017-02-16 ENCOUNTER — Inpatient Hospital Stay (HOSPITAL_COMMUNITY)
Admission: EM | Admit: 2017-02-16 | Discharge: 2017-02-19 | DRG: 065 | Disposition: A | Discharge status: Rehabilitation Facility | Payer: Medicaid Other | Attending: Internal Medicine | Admitting: Internal Medicine

## 2017-02-16 ENCOUNTER — Encounter (HOSPITAL_COMMUNITY): Payer: Self-pay

## 2017-02-16 DIAGNOSIS — R7989 Other specified abnormal findings of blood chemistry: Secondary | ICD-10-CM | POA: Diagnosis present

## 2017-02-16 DIAGNOSIS — H4089 Other specified glaucoma: Secondary | ICD-10-CM | POA: Diagnosis not present

## 2017-02-16 DIAGNOSIS — I428 Other cardiomyopathies: Secondary | ICD-10-CM | POA: Diagnosis present

## 2017-02-16 DIAGNOSIS — Z7982 Long term (current) use of aspirin: Secondary | ICD-10-CM

## 2017-02-16 DIAGNOSIS — I639 Cerebral infarction, unspecified: Secondary | ICD-10-CM | POA: Diagnosis not present

## 2017-02-16 DIAGNOSIS — I69398 Other sequelae of cerebral infarction: Secondary | ICD-10-CM | POA: Diagnosis not present

## 2017-02-16 DIAGNOSIS — I4581 Long QT syndrome: Secondary | ICD-10-CM | POA: Diagnosis present

## 2017-02-16 DIAGNOSIS — I6789 Other cerebrovascular disease: Secondary | ICD-10-CM | POA: Diagnosis not present

## 2017-02-16 DIAGNOSIS — F1010 Alcohol abuse, uncomplicated: Secondary | ICD-10-CM | POA: Diagnosis not present

## 2017-02-16 DIAGNOSIS — Z9114 Patient's other noncompliance with medication regimen: Secondary | ICD-10-CM

## 2017-02-16 DIAGNOSIS — R202 Paresthesia of skin: Secondary | ICD-10-CM | POA: Diagnosis not present

## 2017-02-16 DIAGNOSIS — E7800 Pure hypercholesterolemia, unspecified: Secondary | ICD-10-CM | POA: Diagnosis present

## 2017-02-16 DIAGNOSIS — R4182 Altered mental status, unspecified: Secondary | ICD-10-CM | POA: Diagnosis not present

## 2017-02-16 DIAGNOSIS — E876 Hypokalemia: Secondary | ICD-10-CM | POA: Diagnosis not present

## 2017-02-16 DIAGNOSIS — E785 Hyperlipidemia, unspecified: Secondary | ICD-10-CM | POA: Diagnosis present

## 2017-02-16 DIAGNOSIS — R778 Other specified abnormalities of plasma proteins: Secondary | ICD-10-CM | POA: Diagnosis present

## 2017-02-16 DIAGNOSIS — I69320 Aphasia following cerebral infarction: Secondary | ICD-10-CM | POA: Diagnosis not present

## 2017-02-16 DIAGNOSIS — I5022 Chronic systolic (congestive) heart failure: Secondary | ICD-10-CM | POA: Diagnosis not present

## 2017-02-16 DIAGNOSIS — R748 Abnormal levels of other serum enzymes: Secondary | ICD-10-CM | POA: Diagnosis not present

## 2017-02-16 DIAGNOSIS — I63412 Cerebral infarction due to embolism of left middle cerebral artery: Secondary | ICD-10-CM | POA: Diagnosis not present

## 2017-02-16 DIAGNOSIS — I679 Cerebrovascular disease, unspecified: Secondary | ICD-10-CM | POA: Diagnosis present

## 2017-02-16 DIAGNOSIS — I63512 Cerebral infarction due to unspecified occlusion or stenosis of left middle cerebral artery: Principal | ICD-10-CM | POA: Diagnosis present

## 2017-02-16 DIAGNOSIS — R739 Hyperglycemia, unspecified: Secondary | ICD-10-CM

## 2017-02-16 DIAGNOSIS — I10 Essential (primary) hypertension: Secondary | ICD-10-CM | POA: Diagnosis not present

## 2017-02-16 DIAGNOSIS — Z79899 Other long term (current) drug therapy: Secondary | ICD-10-CM

## 2017-02-16 DIAGNOSIS — R4781 Slurred speech: Secondary | ICD-10-CM | POA: Diagnosis not present

## 2017-02-16 DIAGNOSIS — Z833 Family history of diabetes mellitus: Secondary | ICD-10-CM

## 2017-02-16 DIAGNOSIS — E1136 Type 2 diabetes mellitus with diabetic cataract: Secondary | ICD-10-CM | POA: Diagnosis not present

## 2017-02-16 DIAGNOSIS — I669 Occlusion and stenosis of unspecified cerebral artery: Secondary | ICD-10-CM | POA: Diagnosis not present

## 2017-02-16 DIAGNOSIS — H547 Unspecified visual loss: Secondary | ICD-10-CM | POA: Diagnosis present

## 2017-02-16 DIAGNOSIS — F1410 Cocaine abuse, uncomplicated: Secondary | ICD-10-CM | POA: Diagnosis present

## 2017-02-16 DIAGNOSIS — Z9841 Cataract extraction status, right eye: Secondary | ICD-10-CM | POA: Diagnosis not present

## 2017-02-16 DIAGNOSIS — F17210 Nicotine dependence, cigarettes, uncomplicated: Secondary | ICD-10-CM | POA: Diagnosis present

## 2017-02-16 DIAGNOSIS — I69351 Hemiplegia and hemiparesis following cerebral infarction affecting right dominant side: Secondary | ICD-10-CM

## 2017-02-16 DIAGNOSIS — F4323 Adjustment disorder with mixed anxiety and depressed mood: Secondary | ICD-10-CM | POA: Diagnosis not present

## 2017-02-16 DIAGNOSIS — I6350 Cerebral infarction due to unspecified occlusion or stenosis of unspecified cerebral artery: Secondary | ICD-10-CM | POA: Diagnosis not present

## 2017-02-16 DIAGNOSIS — Z8249 Family history of ischemic heart disease and other diseases of the circulatory system: Secondary | ICD-10-CM

## 2017-02-16 DIAGNOSIS — R260 Ataxic gait: Secondary | ICD-10-CM | POA: Diagnosis not present

## 2017-02-16 DIAGNOSIS — R4701 Aphasia: Secondary | ICD-10-CM | POA: Diagnosis present

## 2017-02-16 DIAGNOSIS — I6523 Occlusion and stenosis of bilateral carotid arteries: Secondary | ICD-10-CM | POA: Diagnosis present

## 2017-02-16 DIAGNOSIS — R471 Dysarthria and anarthria: Secondary | ICD-10-CM | POA: Diagnosis present

## 2017-02-16 DIAGNOSIS — E871 Hypo-osmolality and hyponatremia: Secondary | ICD-10-CM | POA: Diagnosis present

## 2017-02-16 DIAGNOSIS — F1911 Other psychoactive substance abuse, in remission: Secondary | ICD-10-CM

## 2017-02-16 DIAGNOSIS — E669 Obesity, unspecified: Secondary | ICD-10-CM | POA: Diagnosis present

## 2017-02-16 DIAGNOSIS — Z800 Family history of malignant neoplasm of digestive organs: Secondary | ICD-10-CM

## 2017-02-16 DIAGNOSIS — F1020 Alcohol dependence, uncomplicated: Secondary | ICD-10-CM

## 2017-02-16 DIAGNOSIS — H543 Unqualified visual loss, both eyes: Secondary | ICD-10-CM

## 2017-02-16 DIAGNOSIS — H409 Unspecified glaucoma: Secondary | ICD-10-CM | POA: Diagnosis present

## 2017-02-16 DIAGNOSIS — G8191 Hemiplegia, unspecified affecting right dominant side: Secondary | ICD-10-CM | POA: Diagnosis not present

## 2017-02-16 DIAGNOSIS — E1165 Type 2 diabetes mellitus with hyperglycemia: Secondary | ICD-10-CM | POA: Diagnosis not present

## 2017-02-16 DIAGNOSIS — Z6832 Body mass index (BMI) 32.0-32.9, adult: Secondary | ICD-10-CM | POA: Diagnosis not present

## 2017-02-16 DIAGNOSIS — E1151 Type 2 diabetes mellitus with diabetic peripheral angiopathy without gangrene: Secondary | ICD-10-CM | POA: Diagnosis present

## 2017-02-16 DIAGNOSIS — I69322 Dysarthria following cerebral infarction: Secondary | ICD-10-CM | POA: Diagnosis not present

## 2017-02-16 DIAGNOSIS — Z7984 Long term (current) use of oral hypoglycemic drugs: Secondary | ICD-10-CM | POA: Diagnosis not present

## 2017-02-16 DIAGNOSIS — I5042 Chronic combined systolic (congestive) and diastolic (congestive) heart failure: Secondary | ICD-10-CM | POA: Diagnosis present

## 2017-02-16 DIAGNOSIS — F1910 Other psychoactive substance abuse, uncomplicated: Secondary | ICD-10-CM | POA: Diagnosis not present

## 2017-02-16 DIAGNOSIS — I69390 Apraxia following cerebral infarction: Secondary | ICD-10-CM | POA: Diagnosis not present

## 2017-02-16 DIAGNOSIS — I110 Hypertensive heart disease with heart failure: Secondary | ICD-10-CM | POA: Diagnosis not present

## 2017-02-16 DIAGNOSIS — F17200 Nicotine dependence, unspecified, uncomplicated: Secondary | ICD-10-CM | POA: Diagnosis not present

## 2017-02-16 DIAGNOSIS — R9431 Abnormal electrocardiogram [ECG] [EKG]: Secondary | ICD-10-CM

## 2017-02-16 DIAGNOSIS — R55 Syncope and collapse: Secondary | ICD-10-CM | POA: Diagnosis not present

## 2017-02-16 DIAGNOSIS — T17908A Unspecified foreign body in respiratory tract, part unspecified causing other injury, initial encounter: Secondary | ICD-10-CM

## 2017-02-16 DIAGNOSIS — I63332 Cerebral infarction due to thrombosis of left posterior cerebral artery: Secondary | ICD-10-CM

## 2017-02-16 DIAGNOSIS — K5901 Slow transit constipation: Secondary | ICD-10-CM | POA: Diagnosis not present

## 2017-02-16 DIAGNOSIS — I638 Other cerebral infarction: Secondary | ICD-10-CM | POA: Diagnosis not present

## 2017-02-16 DIAGNOSIS — I69920 Aphasia following unspecified cerebrovascular disease: Secondary | ICD-10-CM | POA: Diagnosis not present

## 2017-02-16 DIAGNOSIS — E1139 Type 2 diabetes mellitus with other diabetic ophthalmic complication: Secondary | ICD-10-CM | POA: Diagnosis not present

## 2017-02-16 DIAGNOSIS — F1490 Cocaine use, unspecified, uncomplicated: Secondary | ICD-10-CM

## 2017-02-16 DIAGNOSIS — F1420 Cocaine dependence, uncomplicated: Secondary | ICD-10-CM | POA: Insufficient documentation

## 2017-02-16 LAB — COMPREHENSIVE METABOLIC PANEL
ALBUMIN: 3.6 g/dL (ref 3.5–5.0)
ALT: 18 U/L (ref 17–63)
AST: 25 U/L (ref 15–41)
Alkaline Phosphatase: 67 U/L (ref 38–126)
Anion gap: 12 (ref 5–15)
BUN: 13 mg/dL (ref 6–20)
CHLORIDE: 96 mmol/L — AB (ref 101–111)
CO2: 24 mmol/L (ref 22–32)
CREATININE: 1.13 mg/dL (ref 0.61–1.24)
Calcium: 8.8 mg/dL — ABNORMAL LOW (ref 8.9–10.3)
GFR calc Af Amer: >60 mL/min (ref >60)
GFR calc non Af Amer: >60 mL/min (ref >60)
GLUCOSE: 499 mg/dL — AB (ref 65–99)
Potassium: 3.8 mmol/L (ref 3.5–5.1)
Sodium: 132 mmol/L — ABNORMAL LOW (ref 135–145)
Total Bilirubin: 0.8 mg/dL (ref 0.3–1.2)
Total Protein: 6.5 g/dL (ref 6.5–8.1)

## 2017-02-16 LAB — CBC WITH DIFFERENTIAL/PLATELET
Basophils Absolute: 0 10*3/uL (ref 0.0–0.1)
Basophils Relative: 0 %
EOS PCT: 2 %
Eosinophils Absolute: 0.1 10*3/uL (ref 0.0–0.7)
HCT: 39.1 % (ref 39.0–52.0)
HEMOGLOBIN: 13.6 g/dL (ref 13.0–17.0)
LYMPHS PCT: 35 %
Lymphs Abs: 2.3 10*3/uL (ref 0.7–4.0)
MCH: 32.9 pg (ref 26.0–34.0)
MCHC: 34.8 g/dL (ref 30.0–36.0)
MCV: 94.7 fL (ref 78.0–100.0)
MONOS PCT: 5 %
Monocytes Absolute: 0.4 10*3/uL (ref 0.1–1.0)
NEUTROS PCT: 58 %
Neutro Abs: 3.8 10*3/uL (ref 1.7–7.7)
Platelets: 254 10*3/uL (ref 150–400)
RBC: 4.13 MIL/uL — AB (ref 4.22–5.81)
RDW: 14.6 % (ref 11.5–15.5)
WBC: 6.5 10*3/uL (ref 4.0–10.5)

## 2017-02-16 LAB — URINALYSIS, ROUTINE W REFLEX MICROSCOPIC
BACTERIA UA: NONE SEEN
BILIRUBIN URINE: NEGATIVE
Ketones, ur: 5 mg/dL — AB
Leukocytes, UA: NEGATIVE
NITRITE: NEGATIVE
PH: 5 (ref 5.0–8.0)
Protein, ur: NEGATIVE mg/dL
RBC / HPF: NONE SEEN RBC/hpf (ref 0–5)
SPECIFIC GRAVITY, URINE: 1.023 (ref 1.005–1.030)
Squamous Epithelial / LPF: NONE SEEN

## 2017-02-16 LAB — I-STAT VENOUS BLOOD GAS, ED
Acid-base deficit: 3 mmol/L — ABNORMAL HIGH (ref 0.0–2.0)
BICARBONATE: 22.1 mmol/L (ref 20.0–28.0)
O2 SAT: 70 %
PCO2 VEN: 38.8 mmHg — AB (ref 44.0–60.0)
PO2 VEN: 38 mmHg (ref 32.0–45.0)
TCO2: 23 mmol/L (ref 0–100)
pH, Ven: 7.364 (ref 7.250–7.430)

## 2017-02-16 LAB — I-STAT CHEM 8, ED
BUN: 15 mg/dL (ref 6–20)
CHLORIDE: 96 mmol/L — AB (ref 101–111)
CREATININE: 0.9 mg/dL (ref 0.61–1.24)
Calcium, Ion: 1.09 mmol/L — ABNORMAL LOW (ref 1.15–1.40)
Glucose, Bld: 501 mg/dL (ref 65–99)
HCT: 41 % (ref 39.0–52.0)
Hemoglobin: 13.9 g/dL (ref 13.0–17.0)
POTASSIUM: 3.7 mmol/L (ref 3.5–5.1)
Sodium: 134 mmol/L — ABNORMAL LOW (ref 135–145)
TCO2: 24 mmol/L (ref 0–100)

## 2017-02-16 LAB — RAPID URINE DRUG SCREEN, HOSP PERFORMED
AMPHETAMINES: NOT DETECTED
BARBITURATES: NOT DETECTED
Benzodiazepines: NOT DETECTED
Cocaine: POSITIVE — AB
Opiates: NOT DETECTED
TETRAHYDROCANNABINOL: NOT DETECTED

## 2017-02-16 LAB — CBG MONITORING, ED
Glucose-Capillary: 473 mg/dL — ABNORMAL HIGH (ref 65–99)
Glucose-Capillary: 300 mg/dL — ABNORMAL HIGH (ref 65–99)
Glucose-Capillary: 254 mg/dL — ABNORMAL HIGH (ref 65–99)

## 2017-02-16 LAB — I-STAT TROPONIN, ED: Troponin i, poc: 0.21 ng/mL (ref 0.00–0.08)

## 2017-02-16 LAB — ETHANOL: Alcohol, Ethyl (B): <5 mg/dL (ref <5)

## 2017-02-16 MED ORDER — ACETAMINOPHEN 650 MG RE SUPP: 650.0000 mg | RECTAL | Status: DC | PRN

## 2017-02-16 MED ORDER — THIAMINE HCL 100 MG/ML IJ SOLN: 100.0000 mg | Freq: Every day | INTRAMUSCULAR | Status: DC

## 2017-02-16 MED ORDER — STROKE: EARLY STAGES OF RECOVERY BOOK
Freq: Once | Status: AC
  Administered 2017-02-16: 1
  Filled 2017-02-16: qty 1

## 2017-02-16 MED ORDER — DIFLUPREDNATE 0.05 % OP EMUL
1.0000 [drp] | Freq: Two times a day (BID) | OPHTHALMIC | Status: DC
  Administered 2017-02-17 – 2017-02-18 (×3): 1 [drp] via OPHTHALMIC

## 2017-02-16 MED ORDER — ATORVASTATIN CALCIUM 80 MG PO TABS
80.0000 mg | ORAL_TABLET | Freq: Every day | ORAL | Status: DC
  Administered 2017-02-17 – 2017-02-18 (×3): 80 mg via ORAL
  Filled 2017-02-16 (×3): qty 1

## 2017-02-16 MED ORDER — SODIUM CHLORIDE 0.9 % IV BOLUS (SEPSIS)
1000.0000 mL | Freq: Once | INTRAVENOUS | Status: AC
  Administered 2017-02-16: 1000 mL via INTRAVENOUS

## 2017-02-16 MED ORDER — INSULIN ASPART 100 UNIT/ML ~~LOC~~ SOLN
0.0000 [IU] | SUBCUTANEOUS | Status: DC
  Administered 2017-02-16: 8 [IU] via SUBCUTANEOUS
  Administered 2017-02-17 (×2): 2 [IU] via SUBCUTANEOUS
  Administered 2017-02-17: 3 [IU] via SUBCUTANEOUS
  Filled 2017-02-16: qty 1

## 2017-02-16 MED ORDER — LORAZEPAM 2 MG/ML IJ SOLN
INTRAMUSCULAR | Status: AC
  Filled 2017-02-16: qty 1

## 2017-02-16 MED ORDER — LORAZEPAM 2 MG/ML IJ SOLN
1.0000 mg | Freq: Once | INTRAMUSCULAR | Status: AC
  Administered 2017-02-16: 1 mg via INTRAVENOUS

## 2017-02-16 MED ORDER — DORZOLAMIDE HCL-TIMOLOL MAL 2-0.5 % OP SOLN
1.0000 [drp] | Freq: Two times a day (BID) | OPHTHALMIC | Status: DC
  Administered 2017-02-17 – 2017-02-19 (×4): 1 [drp] via OPHTHALMIC

## 2017-02-16 MED ORDER — VITAMIN B-1 100 MG PO TABS
100.0000 mg | ORAL_TABLET | Freq: Every day | ORAL | Status: DC
  Administered 2017-02-17 – 2017-02-19 (×3): 100 mg via ORAL
  Filled 2017-02-16 (×3): qty 1

## 2017-02-16 MED ORDER — LORAZEPAM 1 MG PO TABS
1.0000 mg | ORAL_TABLET | Freq: Four times a day (QID) | ORAL | Status: DC | PRN
  Administered 2017-02-17: 1 mg via ORAL
  Filled 2017-02-16 (×2): qty 1

## 2017-02-16 MED ORDER — ACETAMINOPHEN 325 MG PO TABS
650.0000 mg | ORAL_TABLET | ORAL | Status: DC | PRN
  Administered 2017-02-17 – 2017-02-18 (×2): 650 mg via ORAL
  Filled 2017-02-16 (×3): qty 2

## 2017-02-16 MED ORDER — LORAZEPAM 2 MG/ML IJ SOLN: 1.0000 mg | Freq: Four times a day (QID) | INTRAMUSCULAR | Status: DC | PRN

## 2017-02-16 MED ORDER — SENNOSIDES-DOCUSATE SODIUM 8.6-50 MG PO TABS: 1.0000 | ORAL_TABLET | Freq: Every evening | ORAL | Status: DC | PRN

## 2017-02-16 MED ORDER — ENOXAPARIN SODIUM 40 MG/0.4ML ~~LOC~~ SOLN
40.0000 mg | Freq: Every day | SUBCUTANEOUS | Status: DC
  Administered 2017-02-17 – 2017-02-18 (×3): 40 mg via SUBCUTANEOUS
  Filled 2017-02-16 (×3): qty 0.4

## 2017-02-16 MED ORDER — ACETAMINOPHEN 160 MG/5ML PO SOLN: 650.0000 mg | ORAL | Status: DC | PRN

## 2017-02-16 MED ORDER — CLOPIDOGREL BISULFATE 75 MG PO TABS
75.0000 mg | ORAL_TABLET | Freq: Every day | ORAL | Status: DC
  Administered 2017-02-17 – 2017-02-19 (×3): 75 mg via ORAL
  Filled 2017-02-16 (×4): qty 1

## 2017-02-16 MED ORDER — ASPIRIN 81 MG PO CHEW
324.0000 mg | CHEWABLE_TABLET | Freq: Once | ORAL | Status: AC
  Administered 2017-02-16: 324 mg via ORAL
  Filled 2017-02-16: qty 4

## 2017-02-16 MED ORDER — FOLIC ACID 1 MG PO TABS
1.0000 mg | ORAL_TABLET | Freq: Every day | ORAL | Status: DC
  Administered 2017-02-17 – 2017-02-19 (×3): 1 mg via ORAL
  Filled 2017-02-16 (×3): qty 1

## 2017-02-16 MED ORDER — ADULT MULTIVITAMIN W/MINERALS CH
1.0000 | ORAL_TABLET | Freq: Every day | ORAL | Status: DC
  Administered 2017-02-17 – 2017-02-19 (×3): 1 via ORAL
  Filled 2017-02-16 (×3): qty 1

## 2017-02-16 NOTE — ED Notes (Signed)
Patient transported to MRI 

## 2017-02-16 NOTE — Consult Note (Signed)
CARDIOLOGY CONSULT NOTE   Patient ID: Brady Church MRN: [id number] DOB/AGE: 1961/07/11 56 y.o.  Admit date: 02/16/2017  Requesting Physician: Dr. Eudelia Bunch, ER Physician Primary Physician:   Servando Snare, MD Primary Cardiologist:   Chilton Si, MD Reason for Consultation:   Elevated Troponin  HPI: Brady Church is a 56 y.o. male with a history of  Hypertension, (Echo 12/2014; chronic systolic heart failure EF 40-45%. Hypokineses of inferolateral myocardium, grade 1 diastolic function. Lexiscan 07/2015; LVEF 30-44%, nuclear EF 40%, no ST segment deviation during stress, defect 1, small defect of mild severity, intermediate risk study, hypercholesterolemia, diabetes, cocaine use (last used a small amount yesterday and smoked a joint), current everyday 0.50 packs/day, drinks 40oz of beer every other day.  Patient to the ER by EMS after being noted to have a staggering gait by the bus driver. He presented to the ER with slurred speech which he still has and he also reports having no sensation in right arm. He reports not taking his BP medications the past two days but when he took it today at 12 pm all of these symptoms started. Denies drug use today, did have one 12 oz beer. He at no time had any chest pain, SOB, orthopnea, le swelling, back pain, headache.  Negative ETOH, positive for cocaine, Head CT old lacunar infarct but no acute findings, normal chest xray, CBG- 509 w/ no anion gap. Patient has elevated initial poc troponin of 0.21. Patient reports his Tropinin is usually elevated but per chart review this is not accurate. Level V caveat- pt currently has significant aphagia. Admitted by the Resident Service and Neurology to consult as well as our service.       Past Medical History:  Diagnosis Date  . 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 Diabetes mellitus   . High cholesterol   . Hypertension   . Substance abuse    cocaine. Stopped in  2010  . Tobacco use disorder 10/07/2014     Past Surgical History:  Procedure Laterality Date  . CATARACT EXTRACTION  01/08/2012   Right eye  . EYE SURGERY     x 4 total     No Known Allergies  I have reviewed the patient's current medications   Prior to Admission medications   Medication Sig Start Date End Date Taking? Authorizing Provider  ACCU-CHEK FASTCLIX LANCETS MISC 1 each by Does not apply route 2 (two) times daily. 04/20/15   Donavan Foil, MD  carvedilol (COREG) 12.5 MG tablet Take 1 tablet (12.5 mg total) by mouth 2 (two) times daily with a meal. 10/11/16   Alexa Lucrezia Starch, MD  dorzolamide-timolol (COSOPT) 22.3-6.8 MG/ML ophthalmic solution Place 1 drop into the right eye 2 (two) times daily. 10/11/16   Alexa Lucrezia Starch, MD  DUREZOL 0.05 % EMUL Place 1 drop into both eyes 2 (two) times daily. 10/11/16   Alexa Lucrezia Starch, MD  EQ ASPIRIN ADULT LOW DOSE 81 MG EC tablet TAKE ONE TABLET BY MOUTH ONCE DAILY 07/26/16   Alexa Lucrezia Starch, MD  glucose blood (ACCU-CHEK SMARTVIEW) test strip Use as instructed 06/09/15   Alexa Lucrezia Starch, MD  hydrochlorothiazide (HYDRODIURIL) 25 MG tablet Take 1 tablet (25 mg total) by mouth daily. 10/11/16   Servando Snare, MD  lisinopril (PRINIVIL,ZESTRIL) 40 MG tablet Take 1 tablet (40 mg total) by mouth daily. 10/11/16   Servando Snare, MD  lovastatin (MEVACOR) 20 MG tablet Take 2 tablets (40 mg total) by mouth at bedtime. 07/10/16   Servando Snare, MD  metFORMIN (GLUCOPHAGE) 1000 MG tablet Take 1 tablet (1,000 mg total) by mouth 2 (two) times daily with a meal. 10/11/16   Alexa Lucrezia Starch, MD  Omeprazole 20 MG TBEC Take 1 tablet (20 mg total) by mouth daily. 09/20/16   Servando Snare, MD     Social History   Social History  . Marital status: Single    Spouse name: [name]  . Number of children: [name]  . Years of education: 33   Occupational History  . Not on file.   Social History Main Topics  . Smoking status: Current Every Day Smoker    Packs/day: 0.50     Types: Cigarettes    Last attempt to quit: 01/16/2006  . Smokeless tobacco: Never Used     Comment: About 10 cigs daily.  . Alcohol use 0.0 oz/week     Comment: Beer- 40 oz. every other day.  . Drug use: No     Comment: Previous cocaine user. Stopped 2 years before.  Marland Kitchen Sexual activity: Not on file   Other Topics Concern  . Not on file   Social History Narrative   Lives in [place] for last 10 years with "friend"   Is not married and does not have kids.   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    Family Status  Relation Status  . Mother Deceased  . Father Deceased  . Sister Alive  . Brother Alive  . Maternal Grandmother    Family History  Problem Relation Age of Onset  . Coronary artery disease Mother 72    died of MI  . Cancer Father 78    Some GI cancer. Not sure if it was Colon Cancer or not.  . Hypertension Father   . Diabetes Maternal Grandmother          ROS:  Level V caveat, limited by significant aphagia.  Physical Exam: Blood pressure 127/84, pulse 81, temperature 98 F (36.7 C), temperature source Rectal, resp. rate 15, SpO2 99 %.  General: Well developed, well nourished, male in no acute distress, +tearful Head: No xanthomas. Normocephalic and atraumatic, Lungs: normal effort and rate of breathing. Heart:: normal rate and rhythm No JVD  Neck: No carotid bruits. No lymphadenopathy. Abdomen: Bowel sounds present, abdomen soft and non-tender Msk:  Spontaneously moves all 4 extremities Extremities: No clubbing or cyanosis.  No le edema Neuro: Alert and oriented X 3. + Aphagia Psych:  Tearful and anxious Skin: No rashes or lesions noted.     Labs:  Lab Results  Component Value Date   WBC 6.5 02/16/2017   HGB 13.9 02/16/2017   HCT 41.0 02/16/2017   MCV 94.7 02/16/2017   PLT 254 02/16/2017   No results for input(s): INR in the last 72 hours.  Recent Labs Lab 02/16/17 1520  02/16/17 1529  NA 132* 134*  K 3.8 3.7  CL 96* 96*  CO2 24  --   BUN 13 15  CREATININE 1.13 0.90  CALCIUM 8.8*  --   PROT 6.5  --   BILITOT 0.8  --   ALKPHOS 67  --   ALT 18  --   AST 25  --   GLUCOSE 499* 501*  ALBUMIN 3.6  --     Recent Labs  02/16/17 1529  TROPIPOC 0.21*    Echo   Transthoracic Echocardigraphy 01/12/2015  Study Conclusions  - Left ventricle: The cavity size was normal. There was mild concentric hypertrophy. Systolic function was mildly to moderately reduced. The estimated ejection fraction was in the range of 40% to 45%. There is hypokinesis of the inferolateral myocardium. Doppler parameters are consistent with abnormal left ventricular relaxation (grade 1 diastolic dysfunction).  Lexiscan 08/25/2015  Study Highlights    The left ventricular ejection fraction is moderately decreased (30-44%).  Nuclear stress EF: 40%.  There was no ST segment deviation noted during stress.  Defect 1: There is a small defect of mild severity.  This is an intermediate risk study.   Intermediate risk lexiscan nuclear study demonstrating a mildly dilated LV with global hypocontractility (EF 40%) with mild apical inferior thinning without scar or ischemia.      ECG:     HR 80, sinus rhythm, left anterior fascicular block. Abnormal R-wave progression, late transition Repol abnrm, severe global ischemia (LM/MVD) with deep TWI, new from prior Prolonged QT interval - personally reviewed.    Radiology: Ct Head Wo Contrast Result Date: 02/16/2017  Atrophy with small vessel chronic ischemic changes of deep cerebral white matter. Probable small old lacunar infarcts at the basal ganglia. No acute intracranial abnormalities.  Dg Chest Port 1 View Result Date: 02/16/2017 Cardiomegaly.  No evidence CHF.  No focal pulmonary infiltrate.       ASSESSMENT AND PLAN:        1. Aphagia and ataxia: This is the primary problem that brought patient to  the ER. Initial head CT is negative, neurology consulting.   2. Elevated Troponin: poc Trop 0.21, he has not had any ACS symptoms of CP, SOB, nausea, back pain or diaphoresis --Patient risk factors of hypertension, hyperlipidemia, smoking, Initial  --Echo 12/2014; mildly to moderately reduced systolic function. EF 40-45%. Hypokineses of inferolateral myocardium, grade 1 diastolic function --Lexiscan 07/2015; LVEF 30-44%, nuclear EF 40%, no ST segment deviation during stress, defect 1, small defect of mild severity, intermediate risk study --Plan is to follow along and trent troponins, will continue to follow and see in the AM.  3. Hypertension: Non compliant with medications at home, blood pressure controlled in ED.  4. Systolic Heart Failure: Does not appear fluid overload, this is diagnosis from 2016 and he appears to have been lost to follow-up  5. Polysubstance abuse: Last used cocaine yesterday,   6. Diabetes: Resident Service to manage.   SignedHerma Ard 02/16/2017 5:43 PM   I have personally seen and examined this patient with Marlon Pel, PA-C. I agree with the assessment and plan as outlined above. He has known HTN, HLD, presumed non-ischemic cardiomyopathy and history of cocaine abuse. He presents today with aphasia. He is being evaluated at this time for an acute stroke. We are consulted for mild troponin elevation of 0.21. He has no chest pain or dyspnea. My exam shows a WDWN male in NAD. He is having difficulty speaking.  CV:RRR. Lungs: clear bilaterally. Abd: soft, NT, ND. Ext: no edema.  I have personally reviewed the EKG and it shows sinus rhythm with deep T wave inversions.  Labs reviewed by me personally.  Plan: He is admitted with acute neurological changes, possible CVA. EKG changes could be consistent with a stroke. He has no chest pain or dyspnea suggestive of angina. The POC troponin is 0.2. I do not think this is ACS but agree with cycling troponins.  We will follow with you.   Verne Carrow 02/16/2017 6:42 PM

## 2017-02-16 NOTE — ED Provider Notes (Signed)
[facility]-EMERGENCY DEPT Provider Note   CSN: [id number] Arrival date & time: 02/16/17  1457     History   Chief Complaint Chief Complaint  Patient presents with  . Altered Mental Status    HPI Brady Church is a 56 y.o. male.  The history is provided by the patient and the EMS personnel.  Cerebrovascular Accident  This is a new problem. The current episode started 1 to 2 hours ago. The problem occurs constantly. The problem has not changed since onset.Pertinent negatives include no chest pain, no abdominal pain, no headaches and no shortness of breath. Nothing aggravates the symptoms. Nothing relieves the symptoms. He has tried nothing for the symptoms.   The patient was dropped off at a bus stop by the bus and noted to have a staggering gait by the bus driver who called EMS and then drove off. Patient was found by EMS at the bus stop with slurred speech. No other focal deficits noted on their evaluation. They did note the patient at baseline is blind and were unable to perform extraocular movements and coordination. EMS noted that the patient's blood pressure was 130s over 70s with a pulse in the 80s. CVG of 460s.  The patient reported that he had been drinking earlier in the morning but stated he only had one 12 ounce beer. It was noted that the patient had a backpack with 40 ounce bottle of beer.  Patient denied any headache, chest pain, shortness of breath, palpitations, weakness. He denied any other illicit drug use.   Past Medical History:  Diagnosis Date  . 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 Diabetes mellitus   . High cholesterol   . Hypertension   . Substance abuse    cocaine. Stopped in 2010  . Tobacco use disorder 10/07/2014    Patient Active Problem List   Diagnosis Date Noted  . Chronic pancreatitis (HCC) 09/20/2016  . Chronic combined systolic and diastolic congestive heart failure (HCC) 05/12/2015  . Stable angina (HCC) 01/07/2015    . Alcohol abuse 11/06/2014  . Tobacco use disorder 10/07/2014  . Preventive measure 01/23/2014  . Glaucoma 05/11/2012  . Hyperlipidemia LDL goal <100 03/22/2012  . GERD (gastroesophageal reflux disease) 01/17/2012  . Hypertension 01/17/2012  . Diabetes mellitus with diabetic cataract (HCC) 03/08/2009    Past Surgical History:  Procedure Laterality Date  . CATARACT EXTRACTION  01/08/2012   Right eye  . EYE SURGERY     x 4 total        Home Medications    Prior to Admission medications   Medication Sig Start Date End Date Taking? Authorizing Provider  ACCU-CHEK FASTCLIX LANCETS MISC 1 each by Does not apply route 2 (two) times daily. 04/20/15   Donavan Foil, MD  carvedilol (COREG) 12.5 MG tablet Take 1 tablet (12.5 mg total) by mouth 2 (two) times daily with a meal. 10/11/16   Alexa Lucrezia Starch, MD  dorzolamide-timolol (COSOPT) 22.3-6.8 MG/ML ophthalmic solution Place 1 drop into the right eye 2 (two) times daily. 10/11/16   Alexa Lucrezia Starch, MD  DUREZOL 0.05 % EMUL Place 1 drop into both eyes 2 (two) times daily. 10/11/16   Alexa Lucrezia Starch, MD  EQ ASPIRIN ADULT LOW DOSE 81 MG EC tablet TAKE ONE TABLET BY MOUTH ONCE DAILY 07/26/16   Alexa Lucrezia Starch, MD  glucose blood (ACCU-CHEK SMARTVIEW) test strip Use as instructed 06/09/15   Servando Snare, MD  hydrochlorothiazide (HYDRODIURIL) 25 MG tablet Take 1 tablet (25 mg total) by mouth daily. 10/11/16   Servando Snare, MD  lisinopril (PRINIVIL,ZESTRIL) 40 MG tablet Take 1 tablet (40 mg total) by mouth daily. 10/11/16   Servando Snare, MD  lovastatin (MEVACOR) 20 MG tablet Take 2 tablets (40 mg total) by mouth at bedtime. 07/10/16   Servando Snare, MD  metFORMIN (GLUCOPHAGE) 1000 MG tablet Take 1 tablet (1,000 mg total) by mouth 2 (two) times daily with a meal. 10/11/16   Alexa Lucrezia Starch, MD  Omeprazole 20 MG TBEC Take 1 tablet (20 mg total) by mouth daily. 09/20/16   Servando Snare, MD    Family History Family History  Problem Relation Age of Onset   . Coronary artery disease Mother 47    died of MI  . Cancer Father 79    Some GI cancer. Not sure if it was Colon Cancer or not.  . Hypertension Father   . Diabetes Maternal Grandmother     Social History Social History  Substance Use Topics  . Smoking status: Current Every Day Smoker    Packs/day: 0.50    Types: Cigarettes    Last attempt to quit: 01/16/2006  . Smokeless tobacco: Never Used     Comment: About 10 cigs daily.  . Alcohol use 0.0 oz/week     Comment: Beer- 40 oz. every other day.     Allergies   Patient has no known allergies.   Review of Systems Review of Systems  Respiratory: Negative for shortness of breath.   Cardiovascular: Negative for chest pain.  Gastrointestinal: Negative for abdominal pain.  Neurological: Negative for headaches.  Ten systems are reviewed and are negative for acute change except as noted in the HPI    Physical Exam Updated Vital Signs BP 127/84   Pulse 81   Temp 98 F (36.7 C) (Rectal)   Resp 15   SpO2 99%    Physical Exam  Constitutional: He is oriented to person, place, and time. He appears well-developed and well-nourished. No distress.  HENT:  Head: Normocephalic and atraumatic.  Nose: Nose normal.  Eyes: Conjunctivae and EOM are normal. Pupils are equal, round, and reactive to light. Right eye exhibits no discharge. Left eye exhibits no discharge. No scleral icterus.  Neck: Normal range of motion. Neck supple.  Cardiovascular: Normal rate and regular rhythm.  Exam reveals no gallop and no friction rub.   No murmur heard. Pulmonary/Chest: Effort normal and breath sounds normal. No stridor. No respiratory distress. He has no rales.  Abdominal: Soft. He exhibits no distension. There is no tenderness.  Musculoskeletal: He exhibits no edema or tenderness.  Neurological: He is alert and oriented to person, place, and time.  Mental Status: Alert and oriented to person, place, and time. Attention and concentration are  delayed. Speech slurred. intac  Cranial Nerves  II Visual Fields:diminished (blind) III, IV, VI: Pupils equal and reactive to light and near. Full eye movement without nystagmus (verbal instructions) V Facial Sensation: Normal. No weakness of masticatory muscles  VII: No facial weakness or asymmetry  VIII Auditory Acuity: Grossly normal  IX/X: The uvula is midline; the palate elevates symmetrically  XI: Normal sternocleidomastoid and trapezius strength  XII: The tongue is midline. No atrophy or fasciculations.   Motor System: Muscle Strength: 5/5 and symmetric in the upper and lower extremities. No pronation or drift.  Muscle Tone: Tone and muscle bulk are normal in the upper and lower extremities.  Reflexes: DTRs: 1+ and symmetrical in all four extremities. Plantar responses are flexor bilaterally.  Coordination: No tremor.  Sensation: Intact to light touch. Gait:deferred    Skin: Skin is warm and dry. No rash noted. He is not diaphoretic. No erythema.  Psychiatric: He has a normal mood and affect.  Vitals reviewed.    ED Treatments / Results  Labs (all labs ordered are listed, but only abnormal results are displayed) Labs Reviewed  COMPREHENSIVE METABOLIC PANEL - Abnormal; Notable for the following:       Result Value   Sodium 132 (*)    Chloride 96 (*)    Glucose, Bld 499 (*)    Calcium 8.8 (*)    All other components within normal limits  RAPID URINE DRUG SCREEN, HOSP PERFORMED - Abnormal; Notable for the following:    Cocaine POSITIVE (*)    All other components within normal limits  URINALYSIS, ROUTINE W REFLEX MICROSCOPIC - Abnormal; Notable for the following:    Color, Urine STRAW (*)    Glucose, UA >=500 (*)    Hgb urine dipstick SMALL (*)    Ketones, ur 5 (*)    All other components within normal limits  CBC WITH DIFFERENTIAL/PLATELET - Abnormal; Notable for the following:    RBC 4.13 (*)    All other components within normal limits  I-STAT CHEM 8, ED -  Abnormal; Notable for the following:    Sodium 134 (*)    Chloride 96 (*)    Glucose, Bld 501 (*)    Calcium, Ion 1.09 (*)    All other components within normal limits  CBG MONITORING, ED - Abnormal; Notable for the following:    Glucose-Capillary 473 (*)    All other components within normal limits  I-STAT VENOUS BLOOD GAS, ED - Abnormal; Notable for the following:    pCO2, Ven 38.8 (*)    Acid-base deficit 3.0 (*)    All other components within normal limits  I-STAT TROPOININ, ED - Abnormal; Notable for the following:    Troponin i, poc 0.21 (*)    All other components within normal limits  ETHANOL  CBC  BLOOD GAS, VENOUS    EKG  EKG Interpretation  Date/Time:  Friday February 16 2017 15:13:41 EDT Ventricular Rate:  80 PR Interval:    QRS Duration: 95 QT Interval:  570 QTC Calculation: 658 R Axis:   -57 Text Interpretation:  Sinus rhythm Left anterior fascicular block Abnormal R-wave progression, late transition Repol abnrm, severe global ischemia (LM/MVD) with deep TWI, new from prior Prolonged QT interval Confirmed by [facility] MD, Ledonna Dormer ([id number]) on 02/16/2017 3:31:13 PM       Radiology Ct Head Wo Contrast  Result Date: 02/16/2017 CLINICAL DATA:  Altered mental status, hypertension, diabetes mellitus, smoker EXAM: CT HEAD WITHOUT CONTRAST TECHNIQUE: Contiguous axial images were obtained from the base of the skull through the vertex without intravenous contrast. COMPARISON:  06/14/2008 FINDINGS: Brain: Generalized atrophy. Normal ventricular morphology. No midline shift or mass effect. Small vessel chronic ischemic changes of deep cerebral white matter. Probable tiny old basal ganglia lacunar infarcts. No intracranial hemorrhage, mass lesion, evidence of acute infarction, or extra-axial fluid collection. Vascular: Atherosclerotic calcifications at the carotid siphons Skull: Intact Sinuses/Orbits: Clear Other: [name] IMPRESSION: Atrophy with small vessel chronic ischemic changes of  deep cerebral white matter. Probable small old lacunar infarcts at the basal ganglia. No acute intracranial abnormalities. Electronically Signed   By: Ulyses Southward M.D.   On: 02/16/2017 16:09  Dg Chest Port 1 View  Result Date: 02/16/2017 CLINICAL DATA:  Altered mental status. EXAM: PORTABLE CHEST 1 VIEW COMPARISON:  04/19/2015. FINDINGS: Mediastinum and hilar structures normal. Cardiomegaly with pulmonary vascular prominence. No focal infiltrate. No pleural effusion or pneumothorax. No acute bony abnormality. IMPRESSION: Cardiomegaly.  No evidence CHF.  No focal pulmonary infiltrate. Electronically Signed   By: [name]  Register   On: 02/16/2017 15:57    Procedures Procedures (including critical care time) CRITICAL CARE Performed by: Amadeo Garnet Obed Samek Total critical care time: 45 minutes Critical care time was exclusive of separately billable procedures and treating other patients. Critical care was necessary to treat or prevent imminent or life-threatening deterioration. Critical care was time spent personally by me on the following activities: development of treatment plan with patient and/or surrogate as well as nursing, discussions with consultants, evaluation of patient's response to treatment, examination of patient, obtaining history from patient or surrogate, ordering and performing treatments and interventions, ordering and review of laboratory studies, ordering and review of radiographic studies, pulse oximetry and re-evaluation of patient's condition.   Medications Ordered in ED Medications  sodium chloride 0.9 % bolus 1,000 mL (1,000 mLs Intravenous New Bag/Given 02/16/17 1542)  aspirin chewable tablet 324 mg (324 mg Oral Given 02/16/17 1658)     Initial Impression / Assessment and Plan / ED Course  I have reviewed the triage vital signs and the nursing notes.  Pertinent labs & imaging results that were available during my care of the patient were reviewed by me and  considered in my medical decision making (see chart for details).     No evidence to suggest infectious sources. EtOH negative however patient is positive for cocaine and UDS. EKG with significant diffuse T-wave inversions with a positive troponin at 0.021. 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 CT of the head did not reveal any evidence of intracranial hemorrhage. Did note a remote lacunar infarcts. Given the patient's persistent slurred speech and touch base with neurology who believes that this is likely due to the cocaine use however did recommend MRI to rule out stroke. 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 Patient was provided with 324mg  aspirin and admitted to medicine for continued workup and management.  Final Clinical Impressions(s) / ED Diagnoses   Final diagnoses:  Elevated troponin  Slurred speech  Cocaine use      Nira Conn, MD 02/16/17 1810

## 2017-02-16 NOTE — ED Triage Notes (Signed)
Pt from EMS from bus stop for pt acting different per bus driver. Pt CBG 473. Pt received of fluids.

## 2017-02-16 NOTE — ED Notes (Signed)
Checked CBG 300 RN Candace informed

## 2017-02-16 NOTE — H&P (Signed)
Date: 02/16/2017               Patient Name:  Brady Church MRN: [id number]  DOB: Aug 29, 1961 Age / Sex: 56 y.o., male   PCP: Servando Snare, MD         Medical Service: Internal Medicine Teaching Service         Attending Physician: Dr. Debe Coder    First Contact: Dr. Althia Forts Pager: [phone]  Second Contact: Dr. John Giovanni Pager: [phone]       After Hours (After 5p/  First Contact Pager: [phone]  weekends / holidays): Second Contact Pager: [phone]   Chief Complaint: Dysarthria  History of Present Illness: Brady Church is a 56 y.o. gentleman with PMH T2DM, HTN, HLD, CHF (LVEF 40-45%, G1DD, 12/2014), blindness, tobacco use, cocaine abuse who presented for altered mental status. He was taking the bus around noon today and the driver noticed him to be acting strange with a staggering gait. The driver called EMS and dropped him off at a bus stop, where he was picked up by EMS. He was noted to have slurred speech at that time and elevated blood sugar to 400+ and brought to the ED for "altered mental status". He was noted to be carrying alcohol in his backpack. Upon our evaluation he was very tearful and anxious, struggling to communicate. He has difficulty thinking of words and also forming them with his mouth. He denies difficulties with comprehension. He also notes that his right arm is completely numb. He reports that his difficulty speaking and arm numbness began suddenly around 12:30 PM today while he was on the bus. He denies any events this morning and has been in his usual state of health. He reported not taking his BP medicines for a few days nor his Metformin for 2-3 weeks. He did take all of his medications today right before noon. He does not endorse any difficulty swallowing but he has been coughing more after drinking water this afternoon. He denies any weakness, confusion, headache, chest pain, abdominal pain, dyspnea, or palpitations.   In the ED he was  afebrile and hemodynamically stable with slurred speech and impaired attention on exam. Unfortunately initial concern was for intoxication. Labs remarkable for CBG 473, ethanol <5, troponin 0.21, and UDS positive for cocaine. EKG was noted to have diffuse T wave inversions. CT head was negative for acute findings. Received Aspirin 325 mg. Cardiology and neurology consulted and IMTS contacted for admission.   Meds:  Current Outpatient Prescriptions:  .  ACCU-CHEK FASTCLIX LANCETS MISC, 1 each by Does not apply route 2 (two) times daily., Disp: 102 each, Rfl: 12 .  carvedilol (COREG) 12.5 MG tablet, Take 1 tablet (12.5 mg total) by mouth 2 (two) times daily with a meal., Disp: 60 tablet, Rfl: 11 .  dorzolamide-timolol (COSOPT) 22.3-6.8 MG/ML ophthalmic solution, Place 1 drop into the right eye 2 (two) times daily., Disp: 10 mL, Rfl: 3 .  DUREZOL 0.05 % EMUL, Place 1 drop into both eyes 2 (two) times daily., Disp: 5 mL, Rfl: 12 .  EQ ASPIRIN ADULT LOW DOSE 81 MG EC tablet, TAKE ONE TABLET BY MOUTH ONCE DAILY, Disp: 30 tablet, Rfl: 11 .  glucose blood (ACCU-CHEK SMARTVIEW) test strip, Use as instructed, Disp: 100 each, Rfl: 12 .  hydrochlorothiazide (HYDRODIURIL) 25 MG tablet, Take 1 tablet (25 mg total) by mouth daily., Disp: 30 tablet, Rfl: 11 .  lisinopril (PRINIVIL,ZESTRIL) 40 MG tablet, Take 1 tablet (40  mg total) by mouth daily., Disp: 30 tablet, Rfl: 11 .  lovastatin (MEVACOR) 20 MG tablet, Take 2 tablets (40 mg total) by mouth at bedtime., Disp: 60 tablet, Rfl: 11 .  metFORMIN (GLUCOPHAGE) 1000 MG tablet, Take 1 tablet (1,000 mg total) by mouth 2 (two) times daily with a meal., Disp: 60 tablet, Rfl: 11 .  Omeprazole 20 MG TBEC, Take 1 tablet (20 mg total) by mouth daily., Disp: 30 each, Rfl: 11   Allergies: Allergies as of 02/16/2017  . (No Known Allergies)   Past Medical History:  Diagnosis Date  . 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 Diabetes mellitus   . High  cholesterol   . Hypertension   . Substance abuse    cocaine. Stopped in 2010  . Tobacco use disorder 10/07/2014    Family History:  Family History  Problem Relation Age of Onset  . Coronary artery disease Mother 43    died of MI  . Cancer Father 68    Some GI cancer. Not sure if it was Colon Cancer or not.  . Hypertension Father   . Diabetes Maternal Grandmother     Social History:  Social History   Social History  . Marital status: Single    Spouse name: [name]  . Number of children: [name]  . Years of education: 22   Occupational History  . Not on file.   Social History Main Topics  . Smoking status: Current Every Day Smoker    Packs/day: 0.50    Types: Cigarettes    Last attempt to quit: 01/16/2006  . Smokeless tobacco: Never Used     Comment: About 10 cigs daily.  . Alcohol use 0.0 oz/week     Comment: Beer- 40 oz. every day.  . Drug use: Yes    Types: Cocaine     Comment: Previous cocaine user. Stopped 2 years before.  Marland Kitchen Sexual activity: Not on file   Other Topics Concern  . Not on file   Social History Narrative   Lives in [place] for last 10 years with "friend"   Is not married and does not have kids.   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    Review of Systems: A complete ROS was negative except as per HPI.   Physical Exam: Blood pressure 127/84, pulse 81, temperature 98 F (36.7 C), temperature source Rectal, resp. rate 15, SpO2 99 %.  General appearance: Tearful man resting in bed, in mild distress, dysarthric speech with conversational difficulty, dry cough HENT: Normocephalic, atraumatic, moist mucous membranes Eyes: Blind bilaterally, PERRL, right pupil abnormality, left with visible cataract opacity, injected sclera bilaterally Cardiovascular: Regular rate and rhythm, no murmurs, rubs, gallops Respiratory: Clear to auscultation bilaterally, normal work of  breathing Abdomen: BS+, soft, non-tender, mildly distended Extremities: Normal bulk and range of motion, no edema, 2+ peripheral pulses Skin: Warm, dry, intact Neuro: Alert and oriented x4, blind bilaterally, otherwise cranial nerves intact, entire right hand and arm diffusely numb to palpation to shoulder, strength intact throughout in all extremities, rapid alternating movement slowed on right, gait deferred Psych: Tearful and anxious affect, slurred speech  EKG: Sinus, LAFB, need new lateral TWI, prolonged QT to 570 ms  pCXR: Cardiomegaly with no evidence of CHF or pulm infiltrate  CT head: Atrophy with small vessel chronic ischemic changes of deep white matter, small old  lacunar BG infarcts, no acute abnormalities  Assessment & Plan by Problem: Mr. Keator is a 56 yo man with PMH DM, HTN, HLD, CHF, tobacco and cocaine use who presented with dysarthria and arm numbness.  Principal Problem:   CVA (cerebral vascular accident) (HCC) Active Problems:   Hypertension   Hyperlipidemia LDL goal <100   Diabetes mellitus with diabetic cataract (HCC)   Chronic combined systolic and diastolic congestive heart failure (HCC)   Elevated troponin   Slurred speech   Substance abuse   Prolonged QT interval  Acute ischemic stroke, acute onset combined dysarthria, expressive aphasia, and right arm numbness occurred at 1230 today, localizes to L MCA territory. Numerous risk factors including HTN, HLD, DM, tobacco and cocaine use. CT head with chronic small vessel disease and remote infarcts. Symptoms initially ascribed to intoxication, and outside TPA window at this time. He reports that he has been taking Aspirin 81 mg daily. Discussed with neurology, felt that his symptoms unlikely due to large vessel occlusion (that would be amenable to thrombectomy) and thus no need for CTA. S/p Aspirin 325 mg -- Neurology following, appreciate recs -- Follow MRI/MRA brain -- Permissive HTN up to SBP 220, DBP 110  for at least 24 hours -- CVA on Asa 81, would start Plavix 75 mg tomorrow -- NPO until bedside swallow, SLP eval -- PT/OT -- Lipid panel, A1c, HIV -- Obtain TTE and carotid doppler US -- Q4H CBG and SS insulin to optimize blood glucose -- Encourage tobacco, cocaine, alcohol cessation  Elevated troponin, 0.21 on admission with new diffuse T wave inversion on EKG, cocaine positive UDS, no chest pain, dyspnea, palpitations. Numerous cardiac risk factors. -- Cardiology following, appreciate recs -- Trend troponins -- AM EKG -- Telemetry  Elevated glucose, DM2, CBG over 400 with no anion gap or acidosis, on Metformin at home but not taking this for 2-3 weeks. -- SSI-M and CBGs Q4h for optimization in setting of acute CVA  Daily alcohol use, reports 40 oz beer daily -- CIWA protocol -- Multivitamin, Thiamine, Folate  FEN/GI: NPO, replete electrolytes as needed  DVT ppx: Lovenox  Code status: Full code  Dispo: Admit patient to Observation with expected length of stay less than 2 midnights.  Signed: Althia Forts, MD 02/16/2017, 5:24 PM  Pager: [phone]

## 2017-02-16 NOTE — Consult Note (Signed)
Neurology Consult Note  Reason for Consultation: Probable stroke  Requesting provider: Drema Pry, MD  CC: Slurred speech, numbness of the right hand  HPI: This is a 56 year old right-handed man who presents to the emergency department for evaluation of slurred speech and numbness involving the right hand. History is obtained from the patient who is a somewhat difficult historian. He has friends at the bedside who offer additional information as needed. I also discussed the case with the emergency room attending and I reviewed the available medical records.  Earlier this afternoon, the patient had apparently been requiring a bus and was dropped off at a bus stop. The driver reportedly saw that he had a staggering gait and called 911 before driving away. EMS found the patient at the bus stop and noted that he had slurred speech without any other deficits. 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 He was noted to have blood pressure 130s over 70s with initial blood glucose 460. The patient initially admitted that he been drinking earlier in the morning and it was thought that his presentation may be related to intoxication the stroke cannot be completely excluded. Neurology consultation was requested for further evaluation.  His neighbor is present at the bedside. She reports that 2 nights ago the patient seemed to have an episode of slurred speech that resolved. Last night she again noticing of slurred speech. This time it persisted. The patient denies any weakness. He has numbness in the right arm but denies any involvement of the right leg or left side of his body. He has not appreciated any acute change in his limited baseline vision.  Last known well: Sometime on the evening of 02/15/17 NHISS score: 3 tPA given?: No, patient is outside of window   PMH:  Past Medical History:  Diagnosis Date  . 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 Diabetes mellitus   . High cholesterol   . Hypertension   . Substance abuse    cocaine. Stopped in 2010  . Tobacco use disorder 10/07/2014    PSH:  Past Surgical History:  Procedure Laterality Date  . CATARACT EXTRACTION  01/08/2012   Right eye  . EYE SURGERY     x 4 total     Family history: Family History  Problem Relation Age of Onset  . Coronary artery disease Mother 10    died of MI  . Cancer Father 13    Some GI cancer. Not sure if it was Colon Cancer or not.  . Hypertension Father   . Diabetes Maternal Grandmother     Social history:  Social History   Social History  . Marital status: Single    Spouse name: [name]  . Number of children: [name]  . Years of education: 22   Occupational History  . Not on file.   Social History Main Topics  . Smoking status: Current Every Day Smoker    Packs/day: 0.50    Types: Cigarettes    Last attempt to quit: 01/16/2006  . Smokeless tobacco: Never Used     Comment: About 10 cigs daily.  . Alcohol use 0.0 oz/week     Comment: Beer- 40 oz. every other day.  . Drug use: No     Comment: Previous cocaine user. Stopped 2 years before.  Marland Kitchen Sexual activity: Not on file   Other  Topics Concern  . Not on file   Social History Narrative   Lives in [place] for last 10 years with "friend"   Is not married and does not have kids.   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    Current outpatient meds: Medications reviewed and reconciled No outpatient prescriptions have been marked as taking for the 02/16/17 encounter [facility] Encounter).    Current inpatient meds: Medications reviewed and reconciled No current facility-administered medications for this encounter.    Current Outpatient Prescriptions  Medication Sig Dispense Refill  . ACCU-CHEK FASTCLIX LANCETS MISC 1 each by Does not apply route 2 (two) times daily.  102 each 12  . carvedilol (COREG) 12.5 MG tablet Take 1 tablet (12.5 mg total) by mouth 2 (two) times daily with a meal. 60 tablet 11  . dorzolamide-timolol (COSOPT) 22.3-6.8 MG/ML ophthalmic solution Place 1 drop into the right eye 2 (two) times daily. 10 mL 3  . DUREZOL 0.05 % EMUL Place 1 drop into both eyes 2 (two) times daily. 5 mL 12  . EQ ASPIRIN ADULT LOW DOSE 81 MG EC tablet TAKE ONE TABLET BY MOUTH ONCE DAILY 30 tablet 11  . glucose blood (ACCU-CHEK SMARTVIEW) test strip Use as instructed 100 each 12  . hydrochlorothiazide (HYDRODIURIL) 25 MG tablet Take 1 tablet (25 mg total) by mouth daily. 30 tablet 11  . lisinopril (PRINIVIL,ZESTRIL) 40 MG tablet Take 1 tablet (40 mg total) by mouth daily. 30 tablet 11  . lovastatin (MEVACOR) 20 MG tablet Take 2 tablets (40 mg total) by mouth at bedtime. 60 tablet 11  . metFORMIN (GLUCOPHAGE) 1000 MG tablet Take 1 tablet (1,000 mg total) by mouth 2 (two) times daily with a meal. 60 tablet 11  . Omeprazole 20 MG TBEC Take 1 tablet (20 mg total) by mouth daily. 30 each 11    Allergies: No Known Allergies  ROS: As per HPI. A full 14-point review of systems was performed and is otherwise unremarkable.   PE:  BP (!) 148/90 (BP Location: Left Arm)   Pulse 83   Temp 98 F (36.7 C) (Rectal)   Resp (!) 21   SpO2 100%   General: WD obese African American man lying on the ED gurney. AAO x4. He is severely dysarthric. No aphasia. Follows commands briskly. Affect is anxious and tearful.  HEENT: Normocephalic. Neck supple without LAD. MMM, OP clear. Dentition good. Sclerae anicteric. Marked conjunctival injection but crying.  CV: Regular, no murmur. Carotid pulses full and symmetric, no bruits. Distal pulses 2+ and symmetric.  Lungs: CTAB.  Abdomen: Soft, obese, non-distended, non-tender. Bowel sounds present x4.  Extremities: No C/C/E. Neuro:  CN: Pupils are equal and round. They are symmetrically reactive from 3-->2 mm. visual field testing is  limited by baseline blindness. EOM testing is limited by patient's blindness. He is noted to have normal volitional saccades in all directions. Facial sensation is intact to light touch and pinprick. Face is symmetric at rest with normal strength and mobility. Hearing is intact to conversational voice. Palate elevates symmetrically and uvula is midline. Voice is normal in tone, pitch and quality. Bilateral SCM and trapezii are 5/5. Tongue is midline with normal bulk and mobility.  Motor: Normal bulk, tone, and strength. No tremor or other abnormal movements. No drift.  Sensation: Intact to light touch. Pinprick is reduced in the right arm. DTRs: 2+, symmetric.  Coordination: Finger-to-nose is limited  by his poor vision. Heel-to-shin is without dysmetria. Finger taps are slower on the right than the left. Gait: Not assessed.  Labs:  Lab Results  Component Value Date   WBC 6.5 02/16/2017   HGB 13.9 02/16/2017   HCT 41.0 02/16/2017   PLT 254 02/16/2017   GLUCOSE 501 (HH) 02/16/2017   CHOL 201 (H) 01/07/2015   TRIG 234 (H) 01/07/2015   HDL 47 01/07/2015   LDLCALC 107 (H) 01/07/2015   ALT 18 02/16/2017   AST 25 02/16/2017   NA 134 (L) 02/16/2017   K 3.7 02/16/2017   CL 96 (L) 02/16/2017   CREATININE 0.90 02/16/2017   BUN 15 02/16/2017   CO2 24 02/16/2017   TSH 1.258 07/21/2009   PSA 0.70 06/06/2010   HGBA1C 7.5 09/20/2016   MICROALBUR 6.55 (H) 01/17/2012   Ethanol less than 5 Urine drug screen positive for cocaine Troponin 0.21 Urinalysis notable for glucose greater than 500, small hemoglobin, ketones 5  Imaging:  I have personally and independently reviewed CT scan of the head without contrast from today. There is mild diffuse generalized atrophy. A mild degree of chronic small vessel ischemic disease is noted in the bihemispheric white matter. 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 No obvious acute abnormality is  appreciated.  Assessment and Plan:  1. Acute Ischemic Stroke: Symptoms are most concerning for acute stroke. Potential Minix in this case include cocaine intoxication and hyperglycemia. Known risk factors for cerebrovascular disease in this patient include hypertension, hypercholesterolemia, history of cigarette smoking, diabetes, cocaine abuse. Recommend additional workup with MRI brain. If this reveals an acute ischemic stroke, then proceed with the remainder of the stroke evaluation with MRA of the head, carotid Dopplers, TTE, fasting lipids, and hemoglobin a1c. Recommend antiplatelet therapy with aspirin for secondary stroke prevention once cleared to take oral medications. Initiate statin with goal LDL less than 70. Ensure adequate glucose control. Allow permissive hypertension in the acute phase, treating only SBP greater than 220 mmHg and/or DBP greater than 110 mmHg. Avoid fever and hyperglycemia as these can extend the infarct. Avoid hypotonic IVF to minimize exacerbation of post-stroke edema. Initiate rehab services. DVT prophylaxis as needed. Cessation from cocaine, tobacco, and alcohol will be imperative for long-term risk reduction.  2. Dysarthria: This is acute, due to presumed stroke as above. Nothing by mouth until cleared by swallow evaluation. Speech therapy to evaluate and treat.  3. Right arm numbness: This is acute, due to presumed stroke. OT to evaluate and treat.  4. Cocaine abuse: Cessation from cocaine is imperative to minimize risk of further stroke in the future. He may benefit from substance abuse counseling.  The patient would likely benefit from acute rehab services. Recommend consultation of PT/OT/SLP with consideration for PM&R consult as appropriate depending upon clinical progress and therapists' recommendations.   Fall risk: Risk factors for falls include reported gait mobility due to acute stroke and vision impairment. Fall precautions. Limit psychoactive medications  and sedating medications.  Delirium risk: Risk factors for delirium include history of substance abuse with cocaine identified and urine drug screen, acute stroke, decreased vision. Continue to optimize metabolic status as you are. Minimize the use of opiates, benzos or any medication with strong anticholinergic properties as much as possible. Optimize sleep-wake cycles as much as you can by keeping the room bright with activity during the day and dark and quiet at night.   Needs outpatient neurology follow-up?: To be determined  This was  discussed with the patient and friends at the bedside. Education was provided on the diagnosis and expected evaluation and treatment. They are in agreement with the plan as noted. They were given the opportunity to ask any questions and these were addressed to their satisfaction.   Thank you for this consultation. The stroke team will assume care of the patient beginning 02/17/17. Please call if any urgent questions or concerns should arise.

## 2017-02-16 NOTE — ED Notes (Signed)
Answered call light. Found patient with left hand IV displaced and bleeding. Direct pressure applied. Bleeding stopped. Site cleaned and dressed.

## 2017-02-16 NOTE — ED Notes (Signed)
Pt very tearful.  Friend at bedside

## 2017-02-16 NOTE — ED Notes (Addendum)
Checked CBG 254, RN Candace informed

## 2017-02-17 ENCOUNTER — Inpatient Hospital Stay (HOSPITAL_COMMUNITY): Payer: Medicaid Other

## 2017-02-17 ENCOUNTER — Encounter (HOSPITAL_COMMUNITY): Payer: Self-pay | Admitting: *Deleted

## 2017-02-17 DIAGNOSIS — I5042 Chronic combined systolic (congestive) and diastolic (congestive) heart failure: Secondary | ICD-10-CM

## 2017-02-17 DIAGNOSIS — I63512 Cerebral infarction due to unspecified occlusion or stenosis of left middle cerebral artery: Principal | ICD-10-CM

## 2017-02-17 DIAGNOSIS — I679 Cerebrovascular disease, unspecified: Secondary | ICD-10-CM

## 2017-02-17 DIAGNOSIS — R739 Hyperglycemia, unspecified: Secondary | ICD-10-CM

## 2017-02-17 DIAGNOSIS — F1010 Alcohol abuse, uncomplicated: Secondary | ICD-10-CM

## 2017-02-17 DIAGNOSIS — I6789 Other cerebrovascular disease: Secondary | ICD-10-CM

## 2017-02-17 DIAGNOSIS — R748 Abnormal levels of other serum enzymes: Secondary | ICD-10-CM

## 2017-02-17 DIAGNOSIS — R471 Dysarthria and anarthria: Secondary | ICD-10-CM

## 2017-02-17 DIAGNOSIS — I639 Cerebral infarction, unspecified: Secondary | ICD-10-CM

## 2017-02-17 DIAGNOSIS — E1136 Type 2 diabetes mellitus with diabetic cataract: Secondary | ICD-10-CM

## 2017-02-17 DIAGNOSIS — G8191 Hemiplegia, unspecified affecting right dominant side: Secondary | ICD-10-CM

## 2017-02-17 DIAGNOSIS — I638 Other cerebral infarction: Secondary | ICD-10-CM

## 2017-02-17 DIAGNOSIS — F1910 Other psychoactive substance abuse, uncomplicated: Secondary | ICD-10-CM

## 2017-02-17 DIAGNOSIS — I10 Essential (primary) hypertension: Secondary | ICD-10-CM

## 2017-02-17 DIAGNOSIS — F17200 Nicotine dependence, unspecified, uncomplicated: Secondary | ICD-10-CM

## 2017-02-17 DIAGNOSIS — R4781 Slurred speech: Secondary | ICD-10-CM

## 2017-02-17 DIAGNOSIS — E785 Hyperlipidemia, unspecified: Secondary | ICD-10-CM

## 2017-02-17 DIAGNOSIS — I6350 Cerebral infarction due to unspecified occlusion or stenosis of unspecified cerebral artery: Secondary | ICD-10-CM

## 2017-02-17 LAB — GLUCOSE, CAPILLARY
GLUCOSE-CAPILLARY: 148 mg/dL — AB (ref 65–99)
GLUCOSE-CAPILLARY: 189 mg/dL — AB (ref 65–99)
GLUCOSE-CAPILLARY: 322 mg/dL — AB (ref 65–99)
GLUCOSE-CAPILLARY: 353 mg/dL — AB (ref 65–99)
Glucose-Capillary: 162 mg/dL — ABNORMAL HIGH (ref 65–99)
Glucose-Capillary: 222 mg/dL — ABNORMAL HIGH (ref 65–99)

## 2017-02-17 LAB — TROPONIN I
Troponin I: 0.2 ng/mL (ref <0.03)
Troponin I: 0.18 ng/mL (ref <0.03)
Troponin I: 0.16 ng/mL (ref <0.03)

## 2017-02-17 LAB — ECHOCARDIOGRAM COMPLETE: WEIGHTICAEL: 3444.8 [oz_av]

## 2017-02-17 LAB — LIPID PANEL
CHOL/HDL RATIO: 7.3 ratio
Cholesterol: 225 mg/dL — ABNORMAL HIGH (ref 0–200)
HDL: 31 mg/dL — AB (ref >40)
LDL CALC: 159 mg/dL — AB (ref 0–99)
Triglycerides: 174 mg/dL — ABNORMAL HIGH (ref <150)
VLDL: 35 mg/dL (ref 0–40)

## 2017-02-17 MED ORDER — IOPAMIDOL (ISOVUE-370) INJECTION 76%
INTRAVENOUS | Status: AC
  Administered 2017-02-17: 50 mL
  Filled 2017-02-17: qty 50

## 2017-02-17 MED ORDER — ASPIRIN EC 325 MG PO TBEC
325.0000 mg | DELAYED_RELEASE_TABLET | Freq: Every day | ORAL | Status: DC
  Administered 2017-02-17 – 2017-02-19 (×3): 325 mg via ORAL
  Filled 2017-02-17 (×3): qty 1

## 2017-02-17 MED ORDER — INSULIN ASPART 100 UNIT/ML ~~LOC~~ SOLN
0.0000 [IU] | Freq: Three times a day (TID) | SUBCUTANEOUS | Status: DC
  Administered 2017-02-17: 5 [IU] via SUBCUTANEOUS
  Administered 2017-02-17: 15 [IU] via SUBCUTANEOUS
  Administered 2017-02-18: 8 [IU] via SUBCUTANEOUS
  Administered 2017-02-18: 15 [IU] via SUBCUTANEOUS

## 2017-02-17 NOTE — Progress Notes (Signed)
Progress Note  Patient Name: Brady Church Date of Encounter: 02/17/2017  Primary Cardiologist: Dr. Duke Salvia  Subjective   Says that his speech is getting better.  He does not report any recent chest pain.    Inpatient Medications    Scheduled Meds: . aspirin EC  325 mg Oral Daily  . atorvastatin  80 mg Oral q1800  . clopidogrel  75 mg Oral Daily  . Difluprednate  1 drop Both Eyes BID  . dorzolamide-timolol  1 drop Right Eye BID  . enoxaparin (LOVENOX) injection  40 mg Subcutaneous QHS  . folic acid  1 mg Oral Daily  . insulin aspart  0-15 Units Subcutaneous TID WC  . multivitamin with minerals  1 tablet Oral Daily  . thiamine  100 mg Oral Daily   Continuous Infusions:  PRN Meds: acetaminophen **OR** acetaminophen (TYLENOL) oral liquid 160 mg/5 mL **OR** acetaminophen, LORazepam **OR** LORazepam, senna-docusate   Vital Signs    Vitals:   02/17/17 0020 02/17/17 0203 02/17/17 0400 02/17/17 0600  BP: 120/74 (!) 114/59 121/82 122/75  Pulse: 74 72 66 74  Resp: 16 16 14 16   Temp: 98.2 F (36.8 C) 98.2 F (36.8 C) 97.9 F (36.6 C) 97.9 F (36.6 C)  TempSrc: Oral Oral Oral Oral  SpO2: 99% 100% 97% 99%  Weight: 215 lb 4.8 oz (97.7 kg)       Intake/Output Summary (Last 24 hours) at 02/17/17 1200 Last data filed at 02/17/17 0041  Gross per 24 hour  Intake             1000 ml  Output              900 ml  Net              100 ml   Filed Weights   02/17/17 0020  Weight: 215 lb 4.8 oz (97.7 kg)     ECG    NSR, rate 71, QT prolonged, diffuse deep T wave inversion.  No change from yesterday. - Personally Reviewed  Physical Exam   GEN: No acute distress.   Neck: No  JVD Cardiac: RRR, no murmurs, rubs, or gallops.  Respiratory: Clear  to auscultation bilaterally. GI: Soft, nontender, non-distended  MS: No  edema; No deformity. Neuro:  Nonfocal, dysarthria Psych: Normal affect   Labs    Chemistry Recent Labs Lab 02/16/17 1520 02/16/17 1529  NA 132*  134*  K 3.8 3.7  CL 96* 96*  CO2 24  --   GLUCOSE 499* 501*  BUN 13 15  CREATININE 1.13 0.90  CALCIUM 8.8*  --   PROT 6.5  --   ALBUMIN 3.6  --   AST 25  --   ALT 18  --   ALKPHOS 67  --   BILITOT 0.8  --   GFRNONAA >60  --   GFRAA >60  --   ANIONGAP 12  --      Hematology Recent Labs Lab 02/16/17 1520 02/16/17 1529  WBC 6.5  --   RBC 4.13*  --   HGB 13.6 13.9  HCT 39.1 41.0  MCV 94.7  --   MCH 32.9  --   MCHC 34.8  --   RDW 14.6  --   PLT 254  --     Cardiac Enzymes Recent Labs Lab 02/16/17 2315 02/17/17 0448 02/17/17 0956  TROPONINI 0.20* 0.18* 0.16*    Recent Labs Lab 02/16/17 1529  TROPIPOC 0.21*     BNPNo results  for input(s): BNP, PROBNP in the last 168 hours.   DDimer No results for input(s): DDIMER in the last 168 hours.   Radiology    Ct Head Wo Contrast  Result Date: 02/16/2017 CLINICAL DATA:  Altered mental status, hypertension, diabetes mellitus, smoker EXAM: CT HEAD WITHOUT CONTRAST TECHNIQUE: Contiguous axial images were obtained from the base of the skull through the vertex without intravenous contrast. COMPARISON:  06/14/2008 FINDINGS: Brain: Generalized atrophy. Normal ventricular morphology. No midline shift or mass effect. Small vessel chronic ischemic changes of deep cerebral white matter. Probable tiny old basal ganglia lacunar infarcts. No intracranial hemorrhage, mass lesion, evidence of acute infarction, or extra-axial fluid collection. Vascular: Atherosclerotic calcifications at the carotid siphons Skull: Intact Sinuses/Orbits: Clear Other: [name] IMPRESSION: Atrophy with small vessel chronic ischemic changes of deep cerebral white matter. Probable small old lacunar infarcts at the basal ganglia. No acute intracranial abnormalities. Electronically Signed   By: Ulyses Southward M.D.   On: 02/16/2017 16:09   Mr Brain Wo Contrast  Result Date: 02/16/2017 CLINICAL DATA:  Initial evaluation for acute slurred speech, right-sided numbness. EXAM:  MRI HEAD WITHOUT CONTRAST MRA HEAD WITHOUT CONTRAST TECHNIQUE: Multiplanar, multiecho pulse sequences of the brain and surrounding structures were obtained without intravenous contrast. Angiographic images of the head were obtained using MRA technique without contrast. COMPARISON:  Prior CT from earlier the same day. FINDINGS: MRI HEAD FINDINGS Brain: Mildly advanced cerebral atrophy for patient age. Patchy T2/FLAIR hyperintensity within the periventricular and deep white matter both cerebral hemispheres most consistent with chronic microvascular ischemic disease, moderate in nature. Abnormal restricted diffusion involving the cortical gray matter of the left parietal lobe, compatible with acute left MCA territory infarct (series 3, image 41). No associated hemorrhage. No mass effect. No other evidence for acute or subacute ischemia. Subcentimeter focus is susceptibility artifact within the left cerebellar hemisphere noted, compatible with a small chronic microhemorrhage. No other evidence for acute or chronic intracranial hemorrhage. No other areas of remote cortical infarction identified. No mass lesion, midline shift, or mass effect. No hydrocephalus. No extra-axial fluid collection. Major dural sinuses are grossly patent. Pituitary gland within normal limits. Vascular: Major intracranial vascular flow voids maintained. Skull and upper cervical spine: Craniocervical junction within normal limits. Visualized upper cervical spine unremarkable. Bone marrow signal intensity within normal limits. No scalp soft tissue abnormality. Sinuses/Orbits: Globes and orbital soft tissues within normal limits. Patient status post lens extraction on the right. Scattered mucosal thickening throughout the paranasal sinuses. No significant mastoid effusion. Inner ear structures within normal limits. Other: No other significant finding. MRA HEAD FINDINGS ANTERIOR CIRCULATION: Study degraded by motion artifact. Distal cervical segments  of the internal carotid arteries are patent with antegrade flow. Petrous segments patent bilaterally. Cavernous and supraclinoid right ICA widely patent. There is moderate atheromatous narrowing at the cavernous left ICA at the level of the anterior genu (series 14, image 73). ICA termini patent bilaterally. Right A1 segment dominant and widely patent. Left A1 segment hypoplastic, which likely accounts for the slightly diminutive left ICA is compared to the right. Anterior communicating artery grossly normal. Anterior cerebral arteries patent to their distal aspects. M1 segments patent without stenosis or occlusion. Possible proximal severe left M2 stenosis (series 452, image 5). Distal MCA branches well opacified distally and fairly symmetric. POSTERIOR CIRCULATION: Vertebral arteries patent to the vertebrobasilar junction. Posterior inferior cerebral arteries not well evaluated on this exam. Basilar artery patent to its distal aspect. Irregularity about the distal basilar artery on reconstructions, likely  combination of motion and vessel tortuosity, poorly evaluated on this exam. Left PCA appears to be supplied primarily via the basilar and is patent to its distal aspect. Right P1 segment appears hypoplastic with a prominent right posterior communicating artery present. Right PCA also grossly patent to its distal aspect. Multifocal atheromatous irregularity suspected. IMPRESSION: MRI HEAD IMPRESSION: 1. 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 No significant mass effect. 2. Mildly advanced cerebral atrophy for patient age with moderate chronic microvascular ischemic disease. MRA HEAD IMPRESSION: 1. Negative MRA for large or proximal arterial branch occlusion. 2. 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 3. Probable multifocal atheromatous disease involving the PCAs bilaterally, right greater than left. 4.  Irregularity about the distal basilar artery, likely combination of motion and vessel tortuosity, poorly evaluated on this exam. A follow-up study to ensure no underlying aneurysm or vascular abnormality is present is suggested. Electronically Signed   By: Rise Mu M.D.   On: 02/16/2017 22:44   Dg Chest Port 1 View  Result Date: 02/16/2017 CLINICAL DATA:  Altered mental status. EXAM: PORTABLE CHEST 1 VIEW COMPARISON:  04/19/2015. FINDINGS: Mediastinum and hilar structures normal. Cardiomegaly with pulmonary vascular prominence. No focal infiltrate. No pleural effusion or pneumothorax. No acute bony abnormality. IMPRESSION: Cardiomegaly.  No evidence CHF.  No focal pulmonary infiltrate. Electronically Signed   By: [name]  Register   On: 02/16/2017 15:57   Mr Maxine Glenn Head/brain Wo Cm  Result Date: 02/16/2017 CLINICAL DATA:  Initial evaluation for acute slurred speech, right-sided numbness. EXAM: MRI HEAD WITHOUT CONTRAST MRA HEAD WITHOUT CONTRAST TECHNIQUE: Multiplanar, multiecho pulse sequences of the brain and surrounding structures were obtained without intravenous contrast. Angiographic images of the head were obtained using MRA technique without contrast. COMPARISON:  Prior CT from earlier the same day. FINDINGS: MRI HEAD FINDINGS Brain: Mildly advanced cerebral atrophy for patient age. Patchy T2/FLAIR hyperintensity within the periventricular and deep white matter both cerebral hemispheres most consistent with chronic microvascular ischemic disease, moderate in nature. Abnormal restricted diffusion involving the cortical gray matter of the left parietal lobe, compatible with acute left MCA territory infarct (series 3, image 41). No associated hemorrhage. No mass effect. No other evidence for acute or subacute ischemia. Subcentimeter focus is susceptibility artifact within the left cerebellar hemisphere noted, compatible with a small chronic microhemorrhage. No other evidence for acute or chronic  intracranial hemorrhage. No other areas of remote cortical infarction identified. No mass lesion, midline shift, or mass effect. No hydrocephalus. No extra-axial fluid collection. Major dural sinuses are grossly patent. Pituitary gland within normal limits. Vascular: Major intracranial vascular flow voids maintained. Skull and upper cervical spine: Craniocervical junction within normal limits. Visualized upper cervical spine unremarkable. Bone marrow signal intensity within normal limits. No scalp soft tissue abnormality. Sinuses/Orbits: Globes and orbital soft tissues within normal limits. Patient status post lens extraction on the right. Scattered mucosal thickening throughout the paranasal sinuses. No significant mastoid effusion. Inner ear structures within normal limits. Other: No other significant finding. MRA HEAD FINDINGS ANTERIOR CIRCULATION: Study degraded by motion artifact. Distal cervical segments of the internal carotid arteries are patent with antegrade flow. Petrous segments patent bilaterally. Cavernous and supraclinoid right ICA widely patent. There is moderate atheromatous narrowing at the cavernous left ICA at the level of the anterior genu (series 14, image 73). ICA termini patent bilaterally. Right A1 segment dominant and widely patent. Left A1 segment hypoplastic, which likely accounts for the slightly diminutive  left ICA is compared to the right. Anterior communicating artery grossly normal. Anterior cerebral arteries patent to their distal aspects. M1 segments patent without stenosis or occlusion. Possible proximal severe left M2 stenosis (series 452, image 5). Distal MCA branches well opacified distally and fairly symmetric. POSTERIOR CIRCULATION: Vertebral arteries patent to the vertebrobasilar junction. Posterior inferior cerebral arteries not well evaluated on this exam. Basilar artery patent to its distal aspect. Irregularity about the distal basilar artery on reconstructions, likely  combination of motion and vessel tortuosity, poorly evaluated on this exam. Left PCA appears to be supplied primarily via the basilar and is patent to its distal aspect. Right P1 segment appears hypoplastic with a prominent right posterior communicating artery present. Right PCA also grossly patent to its distal aspect. Multifocal atheromatous irregularity suspected. IMPRESSION: MRI HEAD IMPRESSION: 1. 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 No significant mass effect. 2. Mildly advanced cerebral atrophy for patient age with moderate chronic microvascular ischemic disease. MRA HEAD IMPRESSION: 1. Negative MRA for large or proximal arterial branch occlusion. 2. 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 3. Probable multifocal atheromatous disease involving the PCAs bilaterally, right greater than left. 4. Irregularity about the distal basilar artery, likely combination of motion and vessel tortuosity, poorly evaluated on this exam. A follow-up study to ensure no underlying aneurysm or vascular abnormality is present is suggested. Electronically Signed   By: Rise Mu M.D.   On: 02/16/2017 22:44    Cardiac Studies   Echo pending  Patient Profile     56 y.o. male a history of  Hypertension, (Echo 12/2014; chronic systolic heart failure EF 40-45%. Hypokineses of inferolateral myocardium, grade 1 diastolic function. Lexiscan 07/2015; LVEF 30-44%, nuclear EF 40%, no ST segment deviation during stress, defect 1, small defect of mild severity, intermediate risk study, hypercholesterolemia, diabetes, cocaine use (last used a small amount yesterday and smoked a joint), current everyday 0.50 packs/day, drinks 40oz of beer every other day.  He presented with slurred speech without acute findings on head CT.  We were called secondary to elevated troponin.    Assessment & Plan    ELEVATED TROPONIN:   Not  thought initially to be an acute coronary syndrome.  Troponins have trended down (slightly flat) in a non diagnostic pattern.    Echo is pending.  No plan for work up pending completion of any further neurology work up or treatment and improvement in symptoms unless there is marked change in echo.   HTN:    BP is controlled.  No change in meds.   CHRONIC SYSTOLIC HF:   Seems to be euvolemic.    Continue current therapy.     Signed, Rollene Rotunda, MD  02/17/2017, 12:00 PM

## 2017-02-17 NOTE — Progress Notes (Signed)
VASCULAR LAB PRELIMINARY  PRELIMINARY  PRELIMINARY  PRELIMINARY  Bilateral lower extremity venous duplex completed.    Preliminary report:  There is no DVT or SVT noted in the bilateral lower extremities.   Carmon Sahli, RVT 02/17/2017, 8:46 AM

## 2017-02-17 NOTE — Evaluation (Signed)
Physical Therapy Evaluation Patient Details Name: Brady Church MRN: [id number] DOB: November 24, 1961 Today's Date: 02/17/2017   History of Present Illness  Pt is a 56 y.o. male with PMH of T2DM, HTN, HLD, CHF (LVEF 40-45%, G1DD, 12/2014), blindness, tobacco use, cocaine abuse who presented with altered mental status on 3/23. Pt was noted to ahve a staggering gait and slurred speech, along with difficulty "thinking of words and also forming them with his mouth. He denies difficulties with comprehension". Also noted R arm numb. 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  Clinical Impression  Patient seen for evaluation s/p CVA. At this time, patient demonstrates functional deficits as indicated below. Will benefit from comprehensive therapies to address deficits and maximize function. Will see as indiicated and progress as tolerated. OF NOTE: limited evaluation due to transport arrival for testing.      Follow Up Recommendations CIR;Supervision for mobility/OOB    Equipment Recommendations   (TBD)    Recommendations for Other Services Rehab consult     Precautions / Restrictions Precautions Precautions: Fall Restrictions Weight Bearing Restrictions: No      Mobility  Bed Mobility Overal bed mobility: Needs Assistance Bed Mobility: Supine to Sit     Supine to sit: Min guard;HOB elevated     General bed mobility comments: use of bed rails to and vc for sequencing  Transfers Overall transfer level: Needs assistance Equipment used: 2 person hand held assist Transfers: Sit to/from Stand Sit to Stand: Min assist         General transfer comment: Min assist for stability and guidance, patient with poor awareness of RUE during movement   Ambulation/Gait Ambulation/Gait assistance: Min assist Ambulation Distance (Feet): 12 Feet Assistive device: 2 person hand held assist Gait Pattern/deviations:  Step-through pattern Gait velocity: decreased   General Gait Details: Min assist for stability and guidance, patient with poor awareness of RUE during movement. Some modest instability noted  Stairs            Wheelchair Mobility    Modified Rankin (Stroke Patients Only) Modified Rankin (Stroke Patients Only) Pre-Morbid Rankin Score: No symptoms Modified Rankin: Moderately severe disability     Balance Overall balance assessment: Needs assistance Sitting-balance support: Bilateral upper extremity supported;Feet supported Sitting balance-Leahy Scale: Fair Sitting balance - Comments: sitting EOB with no back support   Standing balance support: Bilateral upper extremity supported;During functional activity Standing balance-Leahy Scale: Fair                               Pertinent Vitals/Pain Pain Assessment: No/denies pain    Home Living Family/patient expects to be discharged to:: Private residence Living Arrangements: Alone Available Help at Discharge: Friend(s) Type of Home: Apartment                Prior Function Level of Independence: Independent with assistive device(s)         Comments: Pt uses a cane to navigate environment due to blindness     Hand Dominance   Dominant Hand: Right    Extremity/Trunk Assessment   Upper Extremity Assessment Upper Extremity Assessment: RUE deficits/detail RUE Deficits / Details: hand has no sensation, RUE from wrist to shoulder can sense deep pressure, able to hold arm out straight in forward flexion for 10 seconds without drift, and 4+ overall in strength, able to form grasp with 4/5 strength and unable to perform finger  to thumb task RUE Sensation: decreased proprioception;decreased light touch RUE Coordination: decreased fine motor;decreased gross motor    Lower Extremity Assessment Lower Extremity Assessment: Overall WFL for tasks assessed    Cervical / Trunk Assessment Cervical / Trunk  Assessment: Normal  Communication   Communication: Expressive difficulties (Blind)  Cognition Arousal/Alertness: Awake/alert Behavior During Therapy: WFL for tasks assessed/performed (crying after talking to Dr's about new medical status) Overall Cognitive Status: Impaired/Different from baseline Area of Impairment: Safety/judgement;Awareness                         Safety/Judgement: Decreased awareness of deficits;Decreased awareness of safety Awareness: Emergent   General Comments: Expressive deficits impact his ability to communicate what he knows. Does well when given choices.      General Comments General comments (skin integrity, edema, etc.): Pt had a friend in room who helps him with medical decisions    Exercises     Assessment/Plan    PT Assessment Patient needs continued PT services  PT Problem List Decreased activity tolerance;Decreased balance;Decreased mobility;Impaired sensation       PT Treatment Interventions DME instruction;Gait training;Stair training;Functional mobility training;Therapeutic activities;Therapeutic exercise;Balance training;Neuromuscular re-education;Cognitive remediation;Patient/family education    PT Goals (Current goals can be found in the Care Plan section)  Acute Rehab PT Goals Patient Stated Goal: to go to rehab PT Goal Formulation: With patient Time For Goal Achievement: 03/03/17 Potential to Achieve Goals: Good    Frequency Min 3X/week   Barriers to discharge        Co-evaluation PT/OT/SLP Co-Evaluation/Treatment: Yes Reason for Co-Treatment: Complexity of the patient's impairments (multi-system involvement);For patient/therapist safety;To address functional/ADL transfers PT goals addressed during session: Mobility/safety with mobility OT goals addressed during session: ADL's and self-care       End of Session   Activity Tolerance: Patient tolerated treatment well;Other (comment) (limited due to transport  arrival) Patient left: Other (comment) (with transport) Nurse Communication: Mobility status PT Visit Diagnosis: Hemiplegia and hemiparesis Hemiplegia - Right/Left: Right Hemiplegia - dominant/non-dominant: Dominant Hemiplegia - caused by: Cerebral infarction    Time: 1022-1039 PT Time Calculation (min) (ACUTE ONLY): 17 min   Charges:   PT Evaluation $PT Eval Moderate Complexity: 1 Procedure     PT G Codes:        Charlotte Crumb, PT DPT  [phone]   Fabio Asa 02/17/2017, 11:46 AM

## 2017-02-17 NOTE — Progress Notes (Signed)
Subjective: Brady Church reports no improvement in his speech or right arm numbness today. He is able to answer questions with great difficulty. He has been able to eat and drink without difficulty. He denies chest pain, nausea, or any new symptoms this morning.   Telemetry reviewed, uneventful  Objective: Vital signs in last 24 hours: Vitals:   02/17/17 0020 02/17/17 0203 02/17/17 0400 02/17/17 0600  BP: 120/74 (!) 114/59 121/82 122/75  Pulse: 74 72 66 74  Resp: 16 16 14 16   Temp: 98.2 F (36.8 C) 98.2 F (36.8 C) 97.9 F (36.6 C) 97.9 F (36.6 C)  TempSrc: Oral Oral Oral Oral  SpO2: 99% 100% 97% 99%  Weight: 215 lb 4.8 oz (97.7 kg)       Intake/Output Summary (Last 24 hours) at 02/17/17 0844 Last data filed at 02/17/17 0041  Gross per 24 hour  Intake             1000 ml  Output              900 ml  Net              100 ml    Physical Exam General appearance: Tearful man resting in bed, appears comfortable, dysarthric speech with conversational difficulty HENT: Normocephalic, atraumatic, moist mucous membranes Eyes: Blind bilaterally, injected sclera bilaterally Cardiovascular: Regular rate and rhythm, no murmurs, rubs, gallops Respiratory: Clear to auscultation bilaterally, normal work of breathing Abdomen: Soft, non-tender, mildly distended Extremities: Normal bulk and range of motion, no edema, 2+ peripheral pulses Skin: Warm, dry, intact Neuro: Alert and oriented x4, blind bilaterally, otherwise cranial nerves intact, entire right hand and arm diffusely numb to palpation or pinprick to shoulder, strength intact throughout Psych: Tearful and anxious affect  Labs / Imaging / Procedures: CBC Latest Ref Rng & Units 02/16/2017 02/16/2017 07/05/2016  WBC 4.0 - 10.5 K/uL - 6.5 5.8  Hemoglobin 13.0 - 17.0 g/dL 82.9 56.2 -  Hematocrit 39.0 - 52.0 % 41.0 39.1 40.1  Platelets 150 - 400 K/uL - 254 267   BMP Latest Ref Rng & Units 02/16/2017 02/16/2017 09/20/2016  Glucose 65  - 99 mg/dL 130(QM) 578(I) 696(E)  BUN 6 - 20 mg/dL 15 13 10   Creatinine 0.61 - 1.24 mg/dL 9.52 8.41 3.24  BUN/Creat Ratio 9 - 20 - - 10  Sodium 135 - 145 mmol/L 134(L) 132(L) 134  Potassium 3.5 - 5.1 mmol/L 3.7 3.8 3.8  Chloride 101 - 111 mmol/L 96(L) 96(L) 93(L)  CO2 22 - 32 mmol/L - 24 19  Calcium 8.9 - 10.3 mg/dL - 8.8(L) 9.4    Mr Brain Wo Contrast / Mr Maxine Glenn Head/brain Wo Cm  IMPRESSION: MRI HEAD IMPRESSION: 1. 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 No significant mass effect. 2. Mildly advanced cerebral atrophy for patient age with moderate chronic microvascular ischemic disease.   MRA HEAD IMPRESSION: 1. Negative MRA for large or proximal arterial branch occlusion. 2. 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 3. Probable multifocal atheromatous disease involving the PCAs bilaterally, right greater than left. 4. Irregularity about the distal basilar artery, likely combination of motion and vessel tortuosity, poorly evaluated on this exam. A follow-up study to ensure no underlying aneurysm or vascular abnormality is present is suggested. Electronically Signed   By: Rise Mu M.D.   On: 02/16/2017 22:44    Assessment/Plan: Mr. Ishmael is a 56 yo man with PMH DM, HTN,  HLD, CHF, tobacco and cocaine use admitted for acute stroke.   Principal Problem:   CVA (cerebral vascular accident) (HCC) Active Problems:   Hypertension   Hyperlipidemia LDL goal <100   Diabetes mellitus with diabetic cataract (HCC)   Chronic combined systolic and diastolic congestive heart failure (HCC)   Elevated troponin   Slurred speech   Substance abuse   Prolonged QT interval  Acute ischemic stroke, acute onset combined dysarthria, expressive aphasia, and right arm numbness occurred at 1230 yesterday, although may have been present night prior per family report. Symptoms initially ascribed to  intoxication, and outside TPA window at this time. He reports that he has been taking Aspirin 81 mg daily. Discussed with neurology, felt that his symptoms unlikely due to large vessel occlusion (that would be amenable to thrombectomy) and thus no need for CTA. MRI revealed left posterior frontoparietal cortex infarct. MRA revealed no LVO and diffuse cerebrovascular disease with several high grade stenoses. -- Neurology following, appreciate recs -- CTA brain pending -- Permissive HTN up to SBP 220, DBP 110 for at least 24 hours -- CVA on Asa 81, start Plavix 75 mg today -- PT/OT/SLP - rec CIR, consulted -- LDL at 156 - started Lipitor 80 mg and dc'd home Lovastatin -- Obtain TTE -- Encourage tobacco, cocaine, alcohol cessation  Elevated troponins, 0.21 on admission with new diffuse T wave inversion on EKG, cocaine positive UDS, no chest pain, dyspnea, palpitations. Numerous cardiac risk factors. EKG improving with normalization of some TWIs this morning but still abnormal.  -- Cardiology following, appreciate recs -- AM EKG tomorrow -- Telemetry  Elevated glucose, DM2, CBG over 400 with no anion gap or acidosis, on Metformin at home but not taking this for 2-3 weeks. -- SSI-M and CBGs TID AC HS  Daily alcohol use, reports 40 oz beer daily -- CIWA protocol -- Multivitamin, Thiamine, Folate  FEN/GI: HH CM diet, replete electrolytes as needed  DVT ppx: Lovenox  Dispo: Anticipated discharge in approximately 1-2 day(s).   LOS: 1 day   Althia Forts, MD 02/17/2017, 8:44 AM Pager: [phone]

## 2017-02-17 NOTE — Progress Notes (Signed)
OT Cancellation Note  Patient Details Name: Yugan Frantom MRN: [id number] DOB: 18-May-1961   Cancelled Treatment:    Reason Eval/Treat Not Completed: Patient at procedure or test/ unavailable. Pt at echo. Will check back as schedule allows.   Evern Bio Kijuan Gallicchio 02/17/2017, 8:33 AM  Sherryl Manges OTR/L [phone]

## 2017-02-17 NOTE — Progress Notes (Signed)
Echocardiogram 2D Echocardiogram has been performed.  Brady Church 02/17/2017, 8:36 AM

## 2017-02-17 NOTE — Evaluation (Signed)
Speech Language Pathology Evaluation Patient Details Name: Brady Church MRN: [id number] DOB: Apr 05, 1961 Today's Date: 02/17/2017 Time: 7829-5621 SLP Time Calculation (min) (ACUTE ONLY): 22 min  Problem List:  Patient Active Problem List   Diagnosis Date Noted  . CVA (cerebral vascular accident) (HCC) 02/16/2017  . Substance abuse 02/16/2017  . Prolonged QT interval 02/16/2017  . Elevated troponin   . Slurred speech   . Cocaine use   . Chronic pancreatitis (HCC) 09/20/2016  . Chronic combined systolic and diastolic congestive heart failure (HCC) 05/12/2015  . Stable angina (HCC) 01/07/2015  . Alcohol abuse 11/06/2014  . Tobacco use disorder 10/07/2014  . Preventive measure 01/23/2014  . Glaucoma 05/11/2012  . Hyperlipidemia LDL goal <100 03/22/2012  . GERD (gastroesophageal reflux disease) 01/17/2012  . Hypertension 01/17/2012  . Diabetes mellitus with diabetic cataract (HCC) 03/08/2009   Past Medical History:  Past Medical History:  Diagnosis Date  . 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 Diabetes mellitus   . High cholesterol   . Hypertension   . Substance abuse    cocaine. Stopped in 2010  . Tobacco use disorder 10/07/2014   Past Surgical History:  Past Surgical History:  Procedure Laterality Date  . CATARACT EXTRACTION  01/08/2012   Right eye  . EYE SURGERY     x 4 total    HPI:  Pt is a 56 y.o. male with PMH of T2DM, HTN, HLD, CHF (LVEF 40-45%, G1DD, 12/2014),blindness, tobacco use,cocaine abusewho presentedwith altered mental status on 3/23. Pt was noted to ahve a staggering gait and slurred speech, along with difficulty "thinking of words and also forming them with his mouth. He denies difficulties with comprehension". Also noted R arm numb. 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 Speech language eval ordered as part of stroke workup.   Assessment /  Plan / Recommendation Clinical Impression  Pt currently with a moderate-severe dysarthria and expressive language deficits, along with mild receptive language difficulties in following multistep directions. Dysarthria is impacting articulation; speech intelligibility is significantly reduced even at the single word level. Pt also with frequent word repetitions in conversation along with phonemic paraphasias; phonemic cues were beneficial to increase communication success. No difficulties with confrontational naming. Pt emotional during evaluation and is fully aware of communication difficulty. Connected speech was limited due to frustrations. Pt needs continued speech therapy services at next level of care for speech and language skills to increase functional communication. Will continue to follow acutely as well.    SLP Assessment  SLP Recommendation/Assessment: Patient needs continued Speech Lanaguage Pathology Services SLP Visit Diagnosis: Aphasia (R47.01);Dysarthria and anarthria (R47.1)    Follow Up Recommendations  Inpatient Rehab    Frequency and Duration min 2x/week  2 weeks      SLP Evaluation Cognition  Overall Cognitive Status: Impaired/Different from baseline Arousal/Alertness: Awake/alert Orientation Level: Oriented to person;Oriented to place;Oriented to time;Disoriented to situation Attention: Selective Selective Attention: Appears intact Memory: Appears intact Awareness: Appears intact Safety/Judgment: Appears intact       Comprehension  Auditory Comprehension Overall Auditory Comprehension: Impaired Yes/No Questions: Within Functional Limits Commands: Impaired Two Step Basic Commands: 50-74% accurate EffectiveTechniques: Repetition Visual Recognition/Discrimination Discrimination: Not tested Reading Comprehension Reading Status: Unable to assess (comment)    Expression Expression Primary Mode of Expression: Verbal Verbal Expression Overall Verbal Expression:  Impaired Initiation: No impairment Automatic Speech: Counting;Day of week Level of Generative/Spontaneous Verbalization:  Sentence Repetition: Impaired Level of Impairment: Sentence level Naming: No impairment Interfering Components: Speech intelligibility Effective Techniques: Sentence completion;Phonemic cues   Oral / Motor  Oral Motor/Sensory Function Overall Oral Motor/Sensory Function: Mild impairment Facial ROM: Within Functional Limits Facial Symmetry: Within Functional Limits Facial Strength: Within Functional Limits Lingual ROM: Reduced right;Reduced left Motor Speech Overall Motor Speech: Impaired Respiration: Within functional limits Phonation: Normal Resonance: Within functional limits Articulation: Impaired Level of Impairment: Word Intelligibility: Intelligibility reduced Word: 50-74% accurate Sentence: 25-49% accurate Conversation: 25-49% accurate Motor Planning: Witnin functional limits Motor Speech Errors: Aware Effective Techniques: Slow rate   GO                    Loyalty Brashier Cecille Aver, MA, CCC-SLP 02/17/2017, 9:58 AM [phone]

## 2017-02-17 NOTE — Progress Notes (Signed)
To Echo

## 2017-02-17 NOTE — Progress Notes (Signed)
STROKE TEAM PROGRESS NOTE   HISTORY OF PRESENT ILLNESS (per record) This is a 56 year old right-handed man who presents to the emergency department for evaluation of slurred speech and numbness involving the right hand. History is obtained from the patient who is a somewhat difficult historian. He has friends at the bedside who offer additional information as needed. I also discussed the case with the emergency room attending and I reviewed the available medical records.  Earlier this afternoon, the patient had apparently been requiring a bus and was dropped off at a bus stop. The driver reportedly saw that he had a staggering gait and called 911 before driving away. EMS found the patient at the bus stop and noted that he had slurred speech without any other deficits. 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 He was noted to have blood pressure 130s over 70s with initial blood glucose 460. The patient initially admitted that he had been drinking earlier in the morning and it was thought that his presentation may be related to intoxication the stroke cannot be completely excluded. Neurology consultation was requested for further evaluation.  His neighbor is present at the bedside. She reports that 2 nights ago the patient seemed to have an episode of slurred speech that resolved. Last night she again noticing of slurred speech. This time it persisted. The patient denies any weakness. He has numbness in the right arm but denies any involvement of the right leg or left side of his body. He has not appreciated any acute change in his limited baseline vision.  Last known well: Sometime on the evening of 02/15/17 NHISS score: 3 tPA given?: No, patient is outside of window   SUBJECTIVE (INTERVAL HISTORY) His wife is at the bedside.  In front of his wife, he denies cocaine use. However, when wife stepped out, I questioned him again,  he admitted that he used cocaine yesterday. Later on, wife told me in hallway that he uses cocaine everyday unless he has no money to buy that day.    OBJECTIVE Temp:  [97.9 F (36.6 C)-98.2 F (36.8 C)] 97.9 F (36.6 C) (03/24 0600) Pulse Rate:  [66-87] 74 (03/24 0600) Cardiac Rhythm: Normal sinus rhythm (03/24 0700) Resp:  [11-21] 16 (03/24 0600) BP: (102-178)/(59-107) 122/75 (03/24 0600) SpO2:  [97 %-100 %] 99 % (03/24 0600) Weight:  [97.7 kg (215 lb 4.8 oz)] 97.7 kg (215 lb 4.8 oz) (03/24 0020)  CBC:   Recent Labs Lab 02/16/17 1520 02/16/17 1529  WBC 6.5  --   NEUTROABS 3.8  --   HGB 13.6 13.9  HCT 39.1 41.0  MCV 94.7  --   PLT 254  --     Basic Metabolic Panel:   Recent Labs Lab 02/16/17 1520 02/16/17 1529  NA 132* 134*  K 3.8 3.7  CL 96* 96*  CO2 24  --   GLUCOSE 499* 501*  BUN 13 15  CREATININE 1.13 0.90  CALCIUM 8.8*  --     Lipid Panel:     Component Value Date/Time   CHOL 225 (H) 02/17/2017 0448   TRIG 174 (H) 02/17/2017 0448   HDL 31 (L) 02/17/2017 0448   CHOLHDL 7.3 02/17/2017 0448   VLDL 35 02/17/2017 0448   LDLCALC 159 (H) 02/17/2017 0448   HgbA1c:  Lab Results  Component Value Date   HGBA1C 7.5 09/20/2016   Urine Drug Screen:  Component Value Date/Time   LABOPIA NONE DETECTED 02/16/2017 1531   COCAINSCRNUR POSITIVE (A) 02/16/2017 1531   COCAINSCRNUR NEG 07/21/2009 2043   LABBENZ NONE DETECTED 02/16/2017 1531   LABBENZ NEG 07/21/2009 2043   AMPHETMU NONE DETECTED 02/16/2017 1531   THCU NONE DETECTED 02/16/2017 1531   LABBARB NONE DETECTED 02/16/2017 1531      IMAGING I have personally reviewed the radiological images below and agree with the radiology interpretations.  Ct Head Wo Contrast 02/16/2017 Atrophy with small vessel chronic ischemic changes of deep cerebral white matter. Probable small old lacunar infarcts at the basal ganglia. No acute intracranial abnormalities.   Mr Maxine Church Head/brain Wo Cm 02/16/2017 MRI HEAD   1. 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 No significant mass effect.  2. Mildly advanced cerebral atrophy for patient age with moderate chronic microvascular ischemic disease.  MRA HEAD  1. Negative MRA for large or proximal arterial branch occlusion.  2. 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  3. Probable multifocal atheromatous disease involving the PCAs bilaterally, right greater than left.  4. Irregularity about the distal basilar artery, likely combination of motion and vessel tortuosity, poorly evaluated on this exam. A follow-up study to ensure no underlying aneurysm or vascular abnormality is present is suggested.   Dg Chest Port 1 View 02/16/2017 Cardiomegaly.  No evidence CHF.  No focal pulmonary infiltrate.    CT Angiogram Head and Neck - 02/17/2017 1. Negative for emergent large vessel occlusion or MCA branch occlusion. Positive for moderate to severe irregularity and stenosis of the origin of the posterior left M2 branch (series 507, image 27). 2. Moderate to severe atherosclerosis and stenosis also of the: Left ICA siphon, Right PCA P2 segment, and Left vertebral artery origin. 3. Bilateral CCA and cervical ICA soft and calcified atherosclerosis, and tortuosity, but no significant cervical carotid stenosis. 4. Normal anatomic variation including distal third Basilar artery right side fenestration and innominate branching. 5. Expected evolution of the posterior left MCA infarct. No associated hemorrhage or mass effect. 6. No new acute intracranial abnormality.  TTE - Left ventricle: The cavity size was normal. Wall thickness was   increased in a pattern of moderate LVH. Systolic function was   vigorous. The estimated ejection fraction was in the range of 65%   to 70%. Wall motion was normal; there were no regional wall   motion abnormalities. Doppler parameters  are consistent with   abnormal left ventricular relaxation (grade 1 diastolic   dysfunction). - Aortic valve: Valve area (VTI): 3.57 cm^2. Valve area (Vmax):   3.12 cm^2. Valve area (Vmean): 2.85 cm^2. - Atrial septum: No defect or patent foramen ovale was identified. - Technically adequate study.  LE venous doppler -  There is no DVT or SVT noted in the bilateral lower extremities.   PHYSICAL EXAM  Temp:  [97.9 F (36.6 C)-98.8 F (37.1 C)] 98.8 F (37.1 C) (03/24 1419) Pulse Rate:  [66-87] 79 (03/24 1419) Resp:  [11-21] 18 (03/24 1419) BP: (96-178)/(51-107) 96/51 (03/24 1419) SpO2:  [97 %-100 %] 99 % (03/24 1419) Weight:  [215 lb 4.8 oz (97.7 kg)] 215 lb 4.8 oz (97.7 kg) (03/24 0020)  General - Well nourished, well developed, mildly lethargic.  Ophthalmologic - Fundi not visualized due to noncooperation.  Cardiovascular - Regular rate and rhythm.  Mental Status -  Level of arousal and orientation to place, and person were intact, but not to time Language  exam showed partial expressive aphasia with intermittent wording finding difficulty and word salad. Severe dysarthria. Able to follow simple commands. Fund of Knowledge was assessed and was impaired.  Cranial Nerves II - XII - II - left eye blind, and right eye able to [facility] at lower visual field, not only limited HW at upper visual field. III, IV, VI - Extraocular movements not complete on right gaze, still left gaze preference. V - Facial sensation intact bilaterally. VII - left nasolabial fold flattening. VIII - Hearing & vestibular intact bilaterally. X - Palate elevates symmetrically. XI - Chin turning & shoulder shrug intact bilaterally. XII - Tongue protrusion intact.  Motor Strength - The patient's strength was normal in all extremities and pronator drift was absent.  Bulk was normal and fasciculations were absent.   Motor Tone - Muscle tone was assessed at the neck and appendages and was normal.  Reflexes - The  patient's reflexes were 1+ in all extremities and he had no pathological reflexes.  Sensory - Light touch, temperature/pinprick were assessed and were symmetrical.    Coordination - The patient had normal movements in the hands with no ataxia or dysmetria.  Tremor was absent.  Gait and Station - deferred   ASSESSMENT/PLAN Mr. Khader Burgy is a 56 y.o. male with history of partial blindness, previous strokes by imaging, tobacco use, substance abuse, hypertension, hyperlipidemia, and diabetes mellitus presenting with staggering gait, slurred speech, and right arm numbness. He did not receive IV t-PA due to a presentation.  Stroke:  Posterior left MCA territory - favor large vessel disease due to severe vasculopathy from long standing cocaine use. cardioembolic can not be excluded.  Resultant  Severe dysarthria and partial expressive aphasia  MRI - Acute posterior left MCA territory infarct.  MRA - Moderate atheromatous stenosis at the cavernous left ICA, with possible additional high-grade proximal left M2 stenosis.   CTA H&N - diffuse athero including left M2 branch, Left ICA siphon, Right PCA P2 segment, and Left vertebral artery origin.  2D Echo - EF 65-70%  Bilateral lower extremity venous Dopplers - negative for DVT  Pt deemed not to be a good candidate at this time for heart monitoring due to persistent drug abuse and medical noncompliance. Will hold off 30 day cardiac monitoring or loop recorder.  LDL -  159  HgbA1c - pending  VTE prophylaxis - Lovenox Diet heart healthy/carb modified Room service appropriate? Yes; Fluid consistency: Thin  aspirin 81 mg daily prior to admission, now on aspirin 325 mg daily and Plavix 75 mg daily for 3 months and then plavix alone due to intracranial stenosis.   Patient counseled to be compliant with his antithrombotic medications  Ongoing aggressive stroke risk factor management  Therapy recommendations: pending  Disposition:  Pending  Cocaine abuse and cocaine related vasculopathy  UDS showed positive on cocaine  Pt hx of cocaine use   Pt and wife admitted pt constant use of cocaine  Likely the main cause of severe intracranial stenosis  Diabetes  HgbA1c pending, goal < 7.0  Uncontrolled  Hyperglycemia on CBG monitoring  SSI  Need DM education  Close follow up with PCP  Alcohol abuse  On CIWA protocol  On B1/MVI/FA  Pt was counseled on limited alcohol use  Hypertension  Stable  Permissive hypertension (OK if < 220/120) but gradually normalize in 5-7 days  Long-term BP goal normotensive  Hyperlipidemia  Home meds:  Mevacor 40 mg daily not resumed in hospital  LDL 159, goal <  70  Now on Lipitor 80 mg daily  Continue statin at discharge  Tobacco abuse  Current smoker  Smoking cessation counseling provided  Pt is willing to quit  Other Stroke Risk Factors  Obesity, Body mass index is 32.74 kg/m., recommend weight loss, diet and exercise as appropriate   Hx stroke/TIA - by imaging  Other Active Problems  Elevated troponin - cardiology on board - not feel to be NSTEMI - cycling troponin  High TG  Hospital day # 1  Neurology will sign off. Please call with questions. Pt will follow up with Darrol Angel NP at [facility] in about 6 weeks. Thanks for the consult.  Marvel Plan, MD PhD Stroke Neurology 02/17/2017 3:05 PM   To contact Stroke Continuity provider, please refer to [email or web address]. After hours, contact General Neurology

## 2017-02-17 NOTE — Consult Note (Signed)
Physical Medicine and Rehabilitation Consult Reason for Consult:right sided weakness and balance difficulties Referring Physician: Criselda Peaches   HPI: Brady Church is a 56 y.o. male with a history of DM2, CHF, polysubstance abuse, who presented on 02/16/17 with slurred speech, word finding deficits, right sided sensory changes,  and staggering gait. MRI revealed an acute ischemic non-hemorrhagic posterior left MCA infarct. Cardiology following for elevated troponin and feels that the elevation is not an acute syndrome.  Pt was seen by physical and occupational therapy who found the patient to have substantial functional deficits. PM&R was consulted to assess for rehab needs.    Review of Systems  Constitutional: Negative for fever.  HENT: Negative for hearing loss.   Eyes: Positive for blurred vision.  Respiratory: Negative for cough.   Cardiovascular: Negative for chest pain.  Gastrointestinal: Negative for heartburn.  Genitourinary: Negative for dysuria.  Musculoskeletal: Negative for myalgias.  Skin: Negative for rash.  Neurological: Positive for tingling, sensory change, speech change and focal weakness.  Psychiatric/Behavioral: Negative for suicidal ideas.   Past Medical History:  Diagnosis Date  . 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 Diabetes mellitus   . High cholesterol   . Hypertension   . Substance abuse    cocaine. Stopped in 2010  . Tobacco use disorder 10/07/2014   Past Surgical History:  Procedure Laterality Date  . CATARACT EXTRACTION  01/08/2012   Right eye  . EYE SURGERY     x 4 total    Family History  Problem Relation Age of Onset  . Coronary artery disease Mother 43    died of MI  . Cancer Father 59    Some GI cancer. Not sure if it was Colon Cancer or not.  . Hypertension Father   . Diabetes Maternal Grandmother    Social History:  reports that he has been smoking Cigarettes.  He has been smoking about 0.50 packs per  day. He has never used smokeless tobacco. He reports that he drinks alcohol. He reports that he uses drugs, including Cocaine. Allergies: No Known Allergies Medications Prior to Admission  Medication Sig Dispense Refill  . carvedilol (COREG) 6.25 MG tablet Take 6.25 mg by mouth 2 (two) times daily with a meal.    . dorzolamide-timolol (COSOPT) 22.3-6.8 MG/ML ophthalmic solution Place 1 drop into the right eye 2 (two) times daily. 10 mL 3  . EQ ASPIRIN ADULT LOW DOSE 81 MG EC tablet TAKE ONE TABLET BY MOUTH ONCE DAILY 30 tablet 11  . hydrochlorothiazide (HYDRODIURIL) 25 MG tablet Take 1 tablet (25 mg total) by mouth daily. 30 tablet 11  . lisinopril (PRINIVIL,ZESTRIL) 40 MG tablet Take 1 tablet (40 mg total) by mouth daily. 30 tablet 11  . metFORMIN (GLUCOPHAGE) 1000 MG tablet Take 1 tablet (1,000 mg total) by mouth 2 (two) times daily with a meal. 60 tablet 11  . ACCU-CHEK FASTCLIX LANCETS MISC 1 each by Does not apply route 2 (two) times daily. 102 each 12  . glucose blood (ACCU-CHEK SMARTVIEW) test strip Use as instructed 100 each 12  . lovastatin (MEVACOR) 20 MG tablet Take 2 tablets (40 mg total) by mouth at bedtime. (Patient not taking: Reported on 02/16/2017) 60 tablet 11  . Omeprazole 20 MG TBEC Take 1 tablet (20 mg total) by mouth daily. (Patient not taking: Reported on 02/16/2017) 30 each 11    Home: Home Living Family/patient expects to be discharged to::  Private residence Living Arrangements: Alone Available Help at Discharge: Friend(s) Type of Home: Apartment  Lives With: Alone  Functional History: Prior Function Level of Independence: Independent with assistive device(s) Comments: Pt uses a cane to navigate environment due to blindness Functional Status:  Mobility: Bed Mobility Overal bed mobility: Needs Assistance Bed Mobility: Supine to Sit Supine to sit: Min guard, HOB elevated General bed mobility comments: use of bed rails to and vc for sequencing Transfers Overall  transfer level: Needs assistance Equipment used: 2 person hand held assist Transfers: Sit to/from Stand Sit to Stand: Min assist General transfer comment: Min assist for stability and guidance, patient with poor awareness of RUE during movement  Ambulation/Gait Ambulation/Gait assistance: Min assist Ambulation Distance (Feet): 12 Feet Assistive device: 2 person hand held assist Gait Pattern/deviations: Step-through pattern General Gait Details: Min assist for stability and guidance, patient with poor awareness of RUE during movement. Some modest instability noted Gait velocity: decreased    ADL: ADL Overall ADL's : Needs assistance/impaired Eating/Feeding: Moderate assistance Grooming: Wash/dry face, Set up, Sitting Upper Body Bathing: Minimal assistance Lower Body Bathing: Moderate assistance, Cueing for safety Upper Body Dressing : Moderate assistance Lower Body Dressing: Maximal assistance Toilet Transfer: Ambulation, Comfort height toilet, Cueing for safety, Moderate assistance (hand held assist) Toilet Transfer Details (indicate cue type and reason): simulated Toileting- Clothing Manipulation and Hygiene: Moderate assistance Tub/ Shower Transfer: Moderate assistance Functional mobility during ADLs: Minimal assistance (hand held assist)  Cognition: Cognition Overall Cognitive Status: Impaired/Different from baseline Arousal/Alertness: Awake/alert Orientation Level: Oriented to person, Oriented to place, Oriented to time, Disoriented to situation Attention: Selective Selective Attention: Appears intact Memory: Appears intact Awareness: Appears intact Safety/Judgment: Appears intact Cognition Arousal/Alertness: Awake/alert Behavior During Therapy: WFL for tasks assessed/performed (crying after talking to Dr's about new medical status) Overall Cognitive Status: Impaired/Different from baseline Area of Impairment: Safety/judgement, Awareness Safety/Judgement: Decreased  awareness of deficits, Decreased awareness of safety Awareness: Emergent General Comments: Expressive deficits impact his ability to communicate what he knows. Does well when given choices.  Blood pressure 109/64, pulse 95, temperature 98.2 F (36.8 C), temperature source Oral, resp. rate 18, height 5\' 8"  (1.727 m), weight 97.7 kg (215 lb 4.8 oz), SpO2 98 %. Physical Exam  Constitutional: He appears well-developed.  HENT:  Head: Atraumatic.  Eyes: Right eye exhibits no discharge. Left eye exhibits no discharge. Scleral icterus is present.  Neck: Neck supple. No thyromegaly present.  Cardiovascular: Normal rate.   Respiratory: Effort normal.  GI: Soft.  Musculoskeletal: Normal range of motion.  Neurological:  Dysconjugate gaze. Able to ID simple objects. Has difficulty tracking to left. Fight facial weakness and dysarthria.  RUE and RLE grossly 4/5. Decreased LT in RUE and RLE. LUE and LLE 5/5 prox to distal. Fair standing balance with walking stick.  Psychiatric: He has a normal mood and affect.    Results for orders placed or performed during the hospital encounter of 02/16/17 (from the past 24 hour(s))  CBG monitoring, ED     Status: Abnormal   Collection Time: 02/16/17  8:23 PM  Result Value Ref Range   Glucose-Capillary 300 (H) 65 - 99 mg/dL   Comment 1 Notify RN   CBG monitoring, ED     Status: Abnormal   Collection Time: 02/16/17 10:17 PM  Result Value Ref Range   Glucose-Capillary 254 (H) 65 - 99 mg/dL   Comment 1 Notify RN   Troponin I     Status: Abnormal   Collection Time: 02/16/17 11:15  PM  Result Value Ref Range   Troponin I 0.20 (HH) <0.03 ng/mL  Glucose, capillary     Status: Abnormal   Collection Time: 02/17/17 12:32 AM  Result Value Ref Range   Glucose-Capillary 189 (H) 65 - 99 mg/dL   Comment 1 Notify RN    Comment 2 Document in Chart   Glucose, capillary     Status: Abnormal   Collection Time: 02/17/17  4:08 AM  Result Value Ref Range   Glucose-Capillary  162 (H) 65 - 99 mg/dL   Comment 1 Notify RN    Comment 2 Document in Chart   Lipid panel     Status: Abnormal   Collection Time: 02/17/17  4:48 AM  Result Value Ref Range   Cholesterol 225 (H) 0 - 200 mg/dL   Triglycerides 161 (H) <150 mg/dL   HDL 31 (L) >09 mg/dL   Total CHOL/HDL Ratio 7.3 RATIO   VLDL 35 0 - 40 mg/dL   LDL Cholesterol 604 (H) 0 - 99 mg/dL  Troponin I     Status: Abnormal   Collection Time: 02/17/17  4:48 AM  Result Value Ref Range   Troponin I 0.18 (HH) <0.03 ng/mL  Glucose, capillary     Status: Abnormal   Collection Time: 02/17/17  7:28 AM  Result Value Ref Range   Glucose-Capillary 148 (H) 65 - 99 mg/dL  Troponin I     Status: Abnormal   Collection Time: 02/17/17  9:56 AM  Result Value Ref Range   Troponin I 0.16 (HH) <0.03 ng/mL  Glucose, capillary     Status: Abnormal   Collection Time: 02/17/17 11:38 AM  Result Value Ref Range   Glucose-Capillary 353 (H) 65 - 99 mg/dL  Glucose, capillary     Status: Abnormal   Collection Time: 02/17/17  4:13 PM  Result Value Ref Range   Glucose-Capillary 222 (H) 65 - 99 mg/dL   Ct Angio Head W Or Wo Contrast  Result Date: 02/17/2017 CLINICAL DATA:  56 year old male with posterior left MCA infarct, suspected high-grade left M2 branch stenosis. EXAM: CT ANGIOGRAPHY HEAD AND NECK TECHNIQUE: Multidetector CT imaging of the head and neck was performed using the standard protocol during bolus administration of intravenous contrast. Multiplanar CT image reconstructions and MIPs were obtained to evaluate the vascular anatomy. Carotid stenosis measurements (when applicable) are obtained utilizing NASCET criteria, using the distal internal carotid diameter as the denominator. CONTRAST:  50 mL Isovue 370 COMPARISON:  Brain MRI and intracranial MRA 02/16/2017, noncontrast head CT 02/16/2017. FINDINGS: CT HEAD FINDINGS Brain: Hypodense cytotoxic edema has developed in the left parietal lobe/peri-Rolandic cortex corresponding to the  diffusion abnormality yesterday. No associated hemorrhage. No intracranial mass effect. Elsewhere stable gray-white matter differentiation. No ventriculomegaly. Calvarium and skull base: No acute osseous abnormality identified. Paranasal sinuses: Mild bubbly opacity now on the left sphenoid sinus. Elsewhere stable mild paranasal sinus mucosal thickening. Orbits: Stable orbit and scalp soft tissues. CTA NECK Skeleton: No acute osseous abnormality identified. Mild reversal of cervical lordosis. Upper chest: Mild upper lobe dependent atelectasis. No superior mediastinal lymphadenopathy. Other neck: Thyroid, parapharyngeal, retropharyngeal and sublingual spaces are within normal limits. The pharynx and larynx are partially effaced, probably in part due to mild retained secretions. Negative submandibular glands and parotid glands. No cervical lymphadenopathy. Aortic arch: 3 vessel arch configuration, although the left vertebral artery arises from the very proximal left subclavian. No significant arch atherosclerosis. Right carotid system: Negative right CCA origin. Mildly tortuous proximal right CCA. At  the level of the hypopharynx there is medial and anterior soft plaque without stenosis (series 501, image 68). Mild soft and calcified plaque at the right ICA origin. Tortuous proximal right ICA with less than 50 % stenosis with respect to the distal vessel at the proximal bulb (series 508, image 54). Tortuosity within the bulb. Otherwise negative cervical right ICA. Left carotid system: Tortuous proximal left CCA with a mildly kinked appearance. Anterior soft plaque at the level of the hypopharynx without stenosis (series 501, image 67). Soft plaque at the left carotid bifurcation and left ICA origin without stenosis. Soft and calcified plaque at the bulb which is partially retropharyngeal and mildly ectatic (series 505, image 94). No stenosis. The more distal cervical left ICA is negative. Vertebral arteries: No  proximal right subclavian artery stenosis in the right vertebral artery origin occurs early without stenosis. Tortuous right V1 segment. Late entry of the right vertebral artery into the cervical transverse foramen. Negative right vertebral artery to the skullbase. No proximal left subclavian artery stenosis and the left vertebral artery origin occurs early. There is severe left vertebral origin stenosis related to soft plaque, best seen on series 505, image 84. The left V1 segment is then tortuous. The left vertebral is otherwise negative to the skullbase. CTA HEAD Posterior circulation: Fairly codominant distal vertebral arteries with no V4 stenosis. Patent vertebrobasilar junction. A ICAs appear dominant, the left is duplicated. No basilar stenosis. The right distal basilar artery has an unusual fenestration and branching of an innominate vessel as seen on series 505, image 111. This does not appear to be a vascular malformation. The SCA and left PCA origins are normal. Fetal type right PCA origin. Diminutive left posterior communicating artery. The left PCA branches are within normal limits. There is moderate to severe irregularity and stenosis of the right PCA P2 best seen on series 503, image 51. Anterior circulation: Both ICA siphons are patent. There is moderate to severe cavernous and supraclinoid calcified plaque worse on the left. Moderate stenosis of both the distal cavernous and proximal supraclinoid ICA segments (series 508, image 105). Left ophthalmic and posterior communicating artery origins are normal. On the right side there is no hemodynamically significant siphon stenosis. Right ophthalmic and posterior communicating artery origins are normal. Normal carotid termini, MCA and ACA origins. Dominant right ACA A1 segment. Anterior communicating artery and bilateral ACA branches are normal. Right MCA M1 segment, bifurcation, and right MCA branches are within normal limits. Left MCA M1 segment and  left MCA bifurcation are patent. There is moderate to severe irregularity and stenosis at the origin of the dominant posterior left M2 branch as seen on series 503, image 52 and series 507, image 27. However, no left MCA branch occlusion is identified. Venous sinuses: Grossly normal. Anatomic variants: Proximal origin of both vertebral arteries. Partially retropharyngeal course of the left carotid. Unusual fenestration and right side innominate branching of the distal third basilar artery. Fetal type right PCA origin. Dominant right A1 segment. Delayed phase: No abnormal enhancement identified. Review of the MIP images confirms the above findings IMPRESSION: 1. Negative for emergent large vessel occlusion or MCA branch occlusion. Positive for moderate to severe irregularity and stenosis of the origin of the posterior left M2 branch (series 507, image 27). 2. Moderate to severe atherosclerosis and stenosis also of the: Left ICA siphon, Right PCA P2 segment, and Left vertebral artery origin. 3. Bilateral CCA and cervical ICA soft and calcified atherosclerosis, and tortuosity, but no significant cervical carotid  stenosis. 4. Normal anatomic variation including distal third Basilar artery right side fenestration and innominate branching. 5. Expected evolution of the posterior left MCA infarct. No associated hemorrhage or mass effect. 6. No new acute intracranial abnormality. Electronically Signed   By: Odessa Fleming M.D.   On: 02/17/2017 13:28   Ct Head Wo Contrast  Result Date: 02/16/2017 CLINICAL DATA:  Altered mental status, hypertension, diabetes mellitus, smoker EXAM: CT HEAD WITHOUT CONTRAST TECHNIQUE: Contiguous axial images were obtained from the base of the skull through the vertex without intravenous contrast. COMPARISON:  06/14/2008 FINDINGS: Brain: Generalized atrophy. Normal ventricular morphology. No midline shift or mass effect. Small vessel chronic ischemic changes of deep cerebral white matter. Probable  tiny old basal ganglia lacunar infarcts. No intracranial hemorrhage, mass lesion, evidence of acute infarction, or extra-axial fluid collection. Vascular: Atherosclerotic calcifications at the carotid siphons Skull: Intact Sinuses/Orbits: Clear Other: [name] IMPRESSION: Atrophy with small vessel chronic ischemic changes of deep cerebral white matter. Probable small old lacunar infarcts at the basal ganglia. No acute intracranial abnormalities. Electronically Signed   By: Ulyses Southward M.D.   On: 02/16/2017 16:09   Ct Angio Neck W Or Wo Contrast  Result Date: 02/17/2017 CLINICAL DATA:  56 year old male with posterior left MCA infarct, suspected high-grade left M2 branch stenosis. EXAM: CT ANGIOGRAPHY HEAD AND NECK TECHNIQUE: Multidetector CT imaging of the head and neck was performed using the standard protocol during bolus administration of intravenous contrast. Multiplanar CT image reconstructions and MIPs were obtained to evaluate the vascular anatomy. Carotid stenosis measurements (when applicable) are obtained utilizing NASCET criteria, using the distal internal carotid diameter as the denominator. CONTRAST:  50 mL Isovue 370 COMPARISON:  Brain MRI and intracranial MRA 02/16/2017, noncontrast head CT 02/16/2017. FINDINGS: CT HEAD FINDINGS Brain: Hypodense cytotoxic edema has developed in the left parietal lobe/peri-Rolandic cortex corresponding to the diffusion abnormality yesterday. No associated hemorrhage. No intracranial mass effect. Elsewhere stable gray-white matter differentiation. No ventriculomegaly. Calvarium and skull base: No acute osseous abnormality identified. Paranasal sinuses: Mild bubbly opacity now on the left sphenoid sinus. Elsewhere stable mild paranasal sinus mucosal thickening. Orbits: Stable orbit and scalp soft tissues. CTA NECK Skeleton: No acute osseous abnormality identified. Mild reversal of cervical lordosis. Upper chest: Mild upper lobe dependent atelectasis. No superior  mediastinal lymphadenopathy. Other neck: Thyroid, parapharyngeal, retropharyngeal and sublingual spaces are within normal limits. The pharynx and larynx are partially effaced, probably in part due to mild retained secretions. Negative submandibular glands and parotid glands. No cervical lymphadenopathy. Aortic arch: 3 vessel arch configuration, although the left vertebral artery arises from the very proximal left subclavian. No significant arch atherosclerosis. Right carotid system: Negative right CCA origin. Mildly tortuous proximal right CCA. At the level of the hypopharynx there is medial and anterior soft plaque without stenosis (series 501, image 68). Mild soft and calcified plaque at the right ICA origin. Tortuous proximal right ICA with less than 50 % stenosis with respect to the distal vessel at the proximal bulb (series 508, image 54). Tortuosity within the bulb. Otherwise negative cervical right ICA. Left carotid system: Tortuous proximal left CCA with a mildly kinked appearance. Anterior soft plaque at the level of the hypopharynx without stenosis (series 501, image 67). Soft plaque at the left carotid bifurcation and left ICA origin without stenosis. Soft and calcified plaque at the bulb which is partially retropharyngeal and mildly ectatic (series 505, image 94). No stenosis. The more distal cervical left ICA is negative. Vertebral arteries: No proximal right  subclavian artery stenosis in the right vertebral artery origin occurs early without stenosis. Tortuous right V1 segment. Late entry of the right vertebral artery into the cervical transverse foramen. Negative right vertebral artery to the skullbase. No proximal left subclavian artery stenosis and the left vertebral artery origin occurs early. There is severe left vertebral origin stenosis related to soft plaque, best seen on series 505, image 84. The left V1 segment is then tortuous. The left vertebral is otherwise negative to the skullbase. CTA  HEAD Posterior circulation: Fairly codominant distal vertebral arteries with no V4 stenosis. Patent vertebrobasilar junction. A ICAs appear dominant, the left is duplicated. No basilar stenosis. The right distal basilar artery has an unusual fenestration and branching of an innominate vessel as seen on series 505, image 111. This does not appear to be a vascular malformation. The SCA and left PCA origins are normal. Fetal type right PCA origin. Diminutive left posterior communicating artery. The left PCA branches are within normal limits. There is moderate to severe irregularity and stenosis of the right PCA P2 best seen on series 503, image 51. Anterior circulation: Both ICA siphons are patent. There is moderate to severe cavernous and supraclinoid calcified plaque worse on the left. Moderate stenosis of both the distal cavernous and proximal supraclinoid ICA segments (series 508, image 105). Left ophthalmic and posterior communicating artery origins are normal. On the right side there is no hemodynamically significant siphon stenosis. Right ophthalmic and posterior communicating artery origins are normal. Normal carotid termini, MCA and ACA origins. Dominant right ACA A1 segment. Anterior communicating artery and bilateral ACA branches are normal. Right MCA M1 segment, bifurcation, and right MCA branches are within normal limits. Left MCA M1 segment and left MCA bifurcation are patent. There is moderate to severe irregularity and stenosis at the origin of the dominant posterior left M2 branch as seen on series 503, image 52 and series 507, image 27. However, no left MCA branch occlusion is identified. Venous sinuses: Grossly normal. Anatomic variants: Proximal origin of both vertebral arteries. Partially retropharyngeal course of the left carotid. Unusual fenestration and right side innominate branching of the distal third basilar artery. Fetal type right PCA origin. Dominant right A1 segment. Delayed phase: No  abnormal enhancement identified. Review of the MIP images confirms the above findings IMPRESSION: 1. Negative for emergent large vessel occlusion or MCA branch occlusion. Positive for moderate to severe irregularity and stenosis of the origin of the posterior left M2 branch (series 507, image 27). 2. Moderate to severe atherosclerosis and stenosis also of the: Left ICA siphon, Right PCA P2 segment, and Left vertebral artery origin. 3. Bilateral CCA and cervical ICA soft and calcified atherosclerosis, and tortuosity, but no significant cervical carotid stenosis. 4. Normal anatomic variation including distal third Basilar artery right side fenestration and innominate branching. 5. Expected evolution of the posterior left MCA infarct. No associated hemorrhage or mass effect. 6. No new acute intracranial abnormality. Electronically Signed   By: Odessa Fleming M.D.   On: 02/17/2017 13:28   Mr Brain Wo Contrast  Result Date: 02/16/2017 CLINICAL DATA:  Initial evaluation for acute slurred speech, right-sided numbness. EXAM: MRI HEAD WITHOUT CONTRAST MRA HEAD WITHOUT CONTRAST TECHNIQUE: Multiplanar, multiecho pulse sequences of the brain and surrounding structures were obtained without intravenous contrast. Angiographic images of the head were obtained using MRA technique without contrast. COMPARISON:  Prior CT from earlier the same day. FINDINGS: MRI HEAD FINDINGS Brain: Mildly advanced cerebral atrophy for patient age. Patchy T2/FLAIR hyperintensity within  the periventricular and deep white matter both cerebral hemispheres most consistent with chronic microvascular ischemic disease, moderate in nature. Abnormal restricted diffusion involving the cortical gray matter of the left parietal lobe, compatible with acute left MCA territory infarct (series 3, image 41). No associated hemorrhage. No mass effect. No other evidence for acute or subacute ischemia. Subcentimeter focus is susceptibility artifact within the left cerebellar  hemisphere noted, compatible with a small chronic microhemorrhage. No other evidence for acute or chronic intracranial hemorrhage. No other areas of remote cortical infarction identified. No mass lesion, midline shift, or mass effect. No hydrocephalus. No extra-axial fluid collection. Major dural sinuses are grossly patent. Pituitary gland within normal limits. Vascular: Major intracranial vascular flow voids maintained. Skull and upper cervical spine: Craniocervical junction within normal limits. Visualized upper cervical spine unremarkable. Bone marrow signal intensity within normal limits. No scalp soft tissue abnormality. Sinuses/Orbits: Globes and orbital soft tissues within normal limits. Patient status post lens extraction on the right. Scattered mucosal thickening throughout the paranasal sinuses. No significant mastoid effusion. Inner ear structures within normal limits. Other: No other significant finding. MRA HEAD FINDINGS ANTERIOR CIRCULATION: Study degraded by motion artifact. Distal cervical segments of the internal carotid arteries are patent with antegrade flow. Petrous segments patent bilaterally. Cavernous and supraclinoid right ICA widely patent. There is moderate atheromatous narrowing at the cavernous left ICA at the level of the anterior genu (series 14, image 73). ICA termini patent bilaterally. Right A1 segment dominant and widely patent. Left A1 segment hypoplastic, which likely accounts for the slightly diminutive left ICA is compared to the right. Anterior communicating artery grossly normal. Anterior cerebral arteries patent to their distal aspects. M1 segments patent without stenosis or occlusion. Possible proximal severe left M2 stenosis (series 452, image 5). Distal MCA branches well opacified distally and fairly symmetric. POSTERIOR CIRCULATION: Vertebral arteries patent to the vertebrobasilar junction. Posterior inferior cerebral arteries not well evaluated on this exam. Basilar  artery patent to its distal aspect. Irregularity about the distal basilar artery on reconstructions, likely combination of motion and vessel tortuosity, poorly evaluated on this exam. Left PCA appears to be supplied primarily via the basilar and is patent to its distal aspect. Right P1 segment appears hypoplastic with a prominent right posterior communicating artery present. Right PCA also grossly patent to its distal aspect. Multifocal atheromatous irregularity suspected. IMPRESSION: MRI HEAD IMPRESSION: 1. 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 No significant mass effect. 2. Mildly advanced cerebral atrophy for patient age with moderate chronic microvascular ischemic disease. MRA HEAD IMPRESSION: 1. Negative MRA for large or proximal arterial branch occlusion. 2. 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 3. Probable multifocal atheromatous disease involving the PCAs bilaterally, right greater than left. 4. Irregularity about the distal basilar artery, likely combination of motion and vessel tortuosity, poorly evaluated on this exam. A follow-up study to ensure no underlying aneurysm or vascular abnormality is present is suggested. Electronically Signed   By: Rise Mu M.D.   On: 02/16/2017 22:44   Dg Chest Port 1 View  Result Date: 02/16/2017 CLINICAL DATA:  Altered mental status. EXAM: PORTABLE CHEST 1 VIEW COMPARISON:  04/19/2015. FINDINGS: Mediastinum and hilar structures normal. Cardiomegaly with pulmonary vascular prominence. No focal infiltrate. No pleural effusion or pneumothorax. No acute bony abnormality. IMPRESSION: Cardiomegaly.  No evidence CHF.  No focal pulmonary infiltrate. Electronically Signed   By: [name]  Register   On: 02/16/2017 15:57   Mr  Mra Head/brain Wo Cm  Result Date: 02/16/2017 CLINICAL DATA:  Initial evaluation for acute slurred speech, right-sided numbness.  EXAM: MRI HEAD WITHOUT CONTRAST MRA HEAD WITHOUT CONTRAST TECHNIQUE: Multiplanar, multiecho pulse sequences of the brain and surrounding structures were obtained without intravenous contrast. Angiographic images of the head were obtained using MRA technique without contrast. COMPARISON:  Prior CT from earlier the same day. FINDINGS: MRI HEAD FINDINGS Brain: Mildly advanced cerebral atrophy for patient age. Patchy T2/FLAIR hyperintensity within the periventricular and deep white matter both cerebral hemispheres most consistent with chronic microvascular ischemic disease, moderate in nature. Abnormal restricted diffusion involving the cortical gray matter of the left parietal lobe, compatible with acute left MCA territory infarct (series 3, image 41). No associated hemorrhage. No mass effect. No other evidence for acute or subacute ischemia. Subcentimeter focus is susceptibility artifact within the left cerebellar hemisphere noted, compatible with a small chronic microhemorrhage. No other evidence for acute or chronic intracranial hemorrhage. No other areas of remote cortical infarction identified. No mass lesion, midline shift, or mass effect. No hydrocephalus. No extra-axial fluid collection. Major dural sinuses are grossly patent. Pituitary gland within normal limits. Vascular: Major intracranial vascular flow voids maintained. Skull and upper cervical spine: Craniocervical junction within normal limits. Visualized upper cervical spine unremarkable. Bone marrow signal intensity within normal limits. No scalp soft tissue abnormality. Sinuses/Orbits: Globes and orbital soft tissues within normal limits. Patient status post lens extraction on the right. Scattered mucosal thickening throughout the paranasal sinuses. No significant mastoid effusion. Inner ear structures within normal limits. Other: No other significant finding. MRA HEAD FINDINGS ANTERIOR CIRCULATION: Study degraded by motion artifact. Distal cervical  segments of the internal carotid arteries are patent with antegrade flow. Petrous segments patent bilaterally. Cavernous and supraclinoid right ICA widely patent. There is moderate atheromatous narrowing at the cavernous left ICA at the level of the anterior genu (series 14, image 73). ICA termini patent bilaterally. Right A1 segment dominant and widely patent. Left A1 segment hypoplastic, which likely accounts for the slightly diminutive left ICA is compared to the right. Anterior communicating artery grossly normal. Anterior cerebral arteries patent to their distal aspects. M1 segments patent without stenosis or occlusion. Possible proximal severe left M2 stenosis (series 452, image 5). Distal MCA branches well opacified distally and fairly symmetric. POSTERIOR CIRCULATION: Vertebral arteries patent to the vertebrobasilar junction. Posterior inferior cerebral arteries not well evaluated on this exam. Basilar artery patent to its distal aspect. Irregularity about the distal basilar artery on reconstructions, likely combination of motion and vessel tortuosity, poorly evaluated on this exam. Left PCA appears to be supplied primarily via the basilar and is patent to its distal aspect. Right P1 segment appears hypoplastic with a prominent right posterior communicating artery present. Right PCA also grossly patent to its distal aspect. Multifocal atheromatous irregularity suspected. IMPRESSION: MRI HEAD IMPRESSION: 1. 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 No significant mass effect. 2. Mildly advanced cerebral atrophy for patient age with moderate chronic microvascular ischemic disease. MRA HEAD IMPRESSION: 1. Negative MRA for large or proximal arterial branch occlusion. 2. 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 3. Probable multifocal atheromatous disease involving the PCAs bilaterally, right greater than  left. 4. Irregularity about the distal basilar artery, likely combination of motion and vessel tortuosity, poorly evaluated on this exam. A follow-up study to ensure no underlying aneurysm or vascular abnormality is present is suggested. Electronically Signed   By: Sharlet Salina  Phill Myron M.D.   On: 02/16/2017 22:44    Assessment/Plan: Diagnosis: left MCA infarct 1. Does the need for close, 24 hr/day medical supervision in concert with the patient's rehab needs make it unreasonable for this patient to be served in a less intensive setting? Yes 2. Co-Morbidities requiring supervision/potential complications: HTN, chronic CHF/elevated troponin, visual deficits 3. Due to bladder management, bowel management, safety, skin/wound care, disease management, medication administration, pain management and patient education, does the patient require 24 hr/day rehab nursing? Yes 4. Does the patient require coordinated care of a physician, rehab nurse, PT (1-2 hrs/day, 5 days/week), OT (1-2 hrs/day, 5 days/week) and SLP (1-2 hrs/day, 5 days/week) to address physical and functional deficits in the context of the above medical diagnosis(es)? Yes Addressing deficits in the following areas: balance, endurance, locomotion, strength, transferring, bowel/bladder control, bathing, dressing, feeding, grooming, toileting, cognition, speech, swallowing and psychosocial support 5. Can the patient actively participate in an intensive therapy program of at least 3 hrs of therapy per day at least 5 days per week? Yes 6. The potential for patient to make measurable gains while on inpatient rehab is excellent 7. Anticipated functional outcomes upon discharge from inpatient rehab are modified independent and supervision  with PT, modified independent and supervision with OT, modified independent and supervision with SLP. 8. Estimated rehab length of stay to reach the above functional goals is: 7-10 days 9. Does the patient have  adequate social supports and living environment to accommodate these discharge functional goals? Yes 10. Anticipated D/C setting: Home 11. Anticipated post D/C treatments: HH therapy and Outpatient therapy 12. Overall Rehab/Functional Prognosis: excellent  RECOMMENDATIONS: This patient's condition is appropriate for continued rehabilitative care in the following setting: CIR Patient has agreed to participate in recommended program. Yes Note that insurance prior authorization may be required for reimbursement for recommended care.  Comment: Rehab Admissions Coordinator to follow up.  Thanks,  Ranelle Oyster, MD, [place] [facility]    Ranelle Oyster, MD 02/17/2017

## 2017-02-17 NOTE — Evaluation (Signed)
Occupational Therapy Evaluation Patient Details Name: Brady Church MRN: [id number] DOB: September 25, 1961 Today's Date: 02/17/2017    History of Present Illness Pt is a 56 y.o. male with PMH of T2DM, HTN, HLD, CHF (LVEF 40-45%, G1DD, 12/2014), blindness, tobacco use, cocaine abuse who presented with altered mental status on 3/23. Pt was noted to ahve a staggering gait and slurred speech, along with difficulty "thinking of words and also forming them with his mouth. He denies difficulties with comprehension". Also noted R arm numb. 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   Clinical Impression   Limited evaluation this session as Pt had to go to test off the floor, PTA Pt independent in ADL and mobility with white cane (because Pt is blind), using public transportation and has his own apartment. Pt currently mod A for ADL and min A for ambulation with hand held assist. Pt has new expressive difficulties as well as no sensation in R hand and decreased sensation in RUE. Please see report below for more functional assessment information. Pt will require OT in the acute setting for education in compensatory and safety strategies, and will require CIR level therapy due to co-morbidities and to return to PLOF.    Follow Up Recommendations  CIR    Equipment Recommendations  Other (comment) (defer to next venue of care)    Recommendations for Other Services Rehab consult     Precautions / Restrictions Precautions Precautions: Fall Restrictions Weight Bearing Restrictions: No      Mobility Bed Mobility Overal bed mobility: Needs Assistance Bed Mobility: Supine to Sit     Supine to sit: Min guard;HOB elevated     General bed mobility comments: use of bed rails to and vc for sequencing  Transfers Overall transfer level: Needs assistance Equipment used: 2 person hand held assist Transfers: Sit to/from Stand Sit  to Stand: Min assist              Balance Overall balance assessment: Needs assistance Sitting-balance support: Bilateral upper extremity supported;Feet supported Sitting balance-Leahy Scale: Fair Sitting balance - Comments: sitting EOB with no back support   Standing balance support: Bilateral upper extremity supported;During functional activity Standing balance-Leahy Scale: Fair                             ADL either performed or assessed with clinical judgement   ADL Overall ADL's : Needs assistance/impaired Eating/Feeding: Moderate assistance   Grooming: Wash/dry face;Set up;Sitting   Upper Body Bathing: Minimal assistance   Lower Body Bathing: Moderate assistance;Cueing for safety   Upper Body Dressing : Moderate assistance   Lower Body Dressing: Maximal assistance   Toilet Transfer: Ambulation;Comfort height toilet;Cueing for safety;Moderate assistance (hand held assist) Toilet Transfer Details (indicate cue type and reason): simulated Toileting- Clothing Manipulation and Hygiene: Moderate assistance   Tub/ Shower Transfer: Moderate assistance   Functional mobility during ADLs: Minimal assistance (hand held assist)       Vision Baseline Vision/History: Legally blind Patient Visual Report: No change from baseline Additional Comments: no visual assessment performed     Perception     Praxis      Pertinent Vitals/Pain Pain Assessment: No/denies pain     Hand Dominance Right   Extremity/Trunk Assessment Upper Extremity Assessment Upper Extremity Assessment: RUE deficits/detail RUE Deficits / Details: hand has no sensation, RUE from wrist to shoulder can sense deep pressure, able to hold  arm out straight in forward flexion for 10 seconds without drift, and 4+ overall in strength, able to form grasp with 4/5 strength and unable to perform finger to thumb task RUE Sensation: decreased proprioception;decreased light touch RUE Coordination:  decreased fine motor;decreased gross motor   Lower Extremity Assessment Lower Extremity Assessment: Defer to PT evaluation   Cervical / Trunk Assessment Cervical / Trunk Assessment: Normal   Communication Communication Communication: Expressive difficulties   Cognition Arousal/Alertness: Awake/alert Behavior During Therapy: WFL for tasks assessed/performed (crying after talking to Dr's about new medical status) Overall Cognitive Status: Impaired/Different from baseline Area of Impairment: Safety/judgement;Awareness                         Safety/Judgement: Decreased awareness of deficits;Decreased awareness of safety Awareness: Emergent   General Comments: Expressive deficits impact his ability to communicate what he knows. Does well when given choices.   General Comments  Pt had a friend in room who helps him with medical decisions    Exercises     Shoulder Instructions      Home Living Family/patient expects to be discharged to:: Private residence Living Arrangements: Alone Available Help at Discharge: Friend(s) Type of Home: Apartment                              Lives With: Alone    Prior Functioning/Environment Level of Independence: Independent with assistive device(s)        Comments: Pt uses a cane to navigate environment due to blindness        OT Problem List: Decreased activity tolerance;Impaired balance (sitting and/or standing);Decreased safety awareness;Decreased knowledge of use of DME or AE;Impaired UE functional use;Impaired sensation      OT Treatment/Interventions: Self-care/ADL training;Neuromuscular education;Therapeutic activities;Patient/family education;Balance training    OT Goals(Current goals can be found in the care plan section) Acute Rehab OT Goals Patient Stated Goal: to go to rehab OT Goal Formulation: With patient Time For Goal Achievement: 03/06/17 Potential to Achieve Goals: Good ADL Goals Pt Will  Perform Grooming: with modified independence;standing Pt Will Perform Upper Body Bathing: with modified independence;sitting Pt Will Perform Lower Body Bathing: with modified independence;with adaptive equipment;sit to/from stand Pt Will Transfer to Toilet: with modified independence;ambulating;regular height toilet;grab bars Pt Will Perform Toileting - Clothing Manipulation and hygiene: with modified independence;sit to/from stand Additional ADL Goal #1: Pt will recall 2 safety measures he needs to take due to decreased sensation in R hand with no verbal cues  OT Frequency: Min 3X/week   Barriers to D/C: Decreased caregiver support  Pt lives alone - has support of friends at discharge       Co-evaluation PT/OT/SLP Co-Evaluation/Treatment: Yes Reason for Co-Treatment: Complexity of the patient's impairments (multi-system involvement);For patient/therapist safety;To address functional/ADL transfers   OT goals addressed during session: ADL's and self-care      End of Session Equipment Utilized During Treatment: Gait belt Nurse Communication: Mobility status;Precautions  Activity Tolerance: Patient tolerated treatment well Patient left: in bed;Other (comment) (going to test with transport)  OT Visit Diagnosis: Unsteadiness on feet (R26.81);Other abnormalities of gait and mobility (R26.89);Low vision, both eyes (H54.2);Other symptoms and signs involving the nervous system (R29.898);Hemiplegia and hemiparesis Hemiplegia - Right/Left: Right Hemiplegia - dominant/non-dominant: Dominant Hemiplegia - caused by: Cerebral infarction                Time: 1025-1039 OT Time Calculation (min): 14 min Charges:  OT General Charges $OT Visit: 1 Procedure OT Evaluation $OT Eval Moderate Complexity: 1 Procedure G-Codes:     Sherryl Manges OTR/L [phone]  Evern Bio Emylia Latella 02/17/2017, 11:08 AM

## 2017-02-18 DIAGNOSIS — E1139 Type 2 diabetes mellitus with other diabetic ophthalmic complication: Secondary | ICD-10-CM

## 2017-02-18 DIAGNOSIS — H4089 Other specified glaucoma: Secondary | ICD-10-CM

## 2017-02-18 DIAGNOSIS — I63412 Cerebral infarction due to embolism of left middle cerebral artery: Secondary | ICD-10-CM

## 2017-02-18 DIAGNOSIS — E876 Hypokalemia: Secondary | ICD-10-CM

## 2017-02-18 LAB — BASIC METABOLIC PANEL
ANION GAP: 12 (ref 5–15)
BUN: 9 mg/dL (ref 6–20)
CHLORIDE: 97 mmol/L — AB (ref 101–111)
CO2: 24 mmol/L (ref 22–32)
Calcium: 8.9 mg/dL (ref 8.9–10.3)
Creatinine, Ser: 0.88 mg/dL (ref 0.61–1.24)
GFR calc Af Amer: >60 mL/min (ref >60)
GFR calc non Af Amer: >60 mL/min (ref >60)
GLUCOSE: 290 mg/dL — AB (ref 65–99)
POTASSIUM: 3.3 mmol/L — AB (ref 3.5–5.1)
Sodium: 133 mmol/L — ABNORMAL LOW (ref 135–145)

## 2017-02-18 LAB — GLUCOSE, CAPILLARY
Glucose-Capillary: 287 mg/dL — ABNORMAL HIGH (ref 65–99)
Glucose-Capillary: 370 mg/dL — ABNORMAL HIGH (ref 65–99)
Glucose-Capillary: 286 mg/dL — ABNORMAL HIGH (ref 65–99)
Glucose-Capillary: 309 mg/dL — ABNORMAL HIGH (ref 65–99)

## 2017-02-18 LAB — HIV ANTIBODY (ROUTINE TESTING W REFLEX): HIV SCREEN 4TH GENERATION: NONREACTIVE

## 2017-02-18 MED ORDER — INSULIN ASPART 100 UNIT/ML ~~LOC~~ SOLN
0.0000 [IU] | Freq: Every day | SUBCUTANEOUS | Status: DC
  Administered 2017-02-18: 4 [IU] via SUBCUTANEOUS

## 2017-02-18 MED ORDER — POTASSIUM CHLORIDE CRYS ER 10 MEQ PO TBCR
EXTENDED_RELEASE_TABLET | ORAL | Status: AC
Start: 2017-02-18 — End: 2017-02-18
  Administered 2017-02-18: 10 meq
  Filled 2017-02-18: qty 1

## 2017-02-18 MED ORDER — PROCHLORPERAZINE MALEATE 5 MG PO TABS
5.0000 mg | ORAL_TABLET | Freq: Four times a day (QID) | ORAL | Status: DC | PRN
  Administered 2017-02-18: 5 mg via ORAL
  Filled 2017-02-18 (×2): qty 1

## 2017-02-18 MED ORDER — INSULIN ASPART 100 UNIT/ML ~~LOC~~ SOLN
0.0000 [IU] | Freq: Three times a day (TID) | SUBCUTANEOUS | Status: DC
  Administered 2017-02-18 – 2017-02-19 (×2): 11 [IU] via SUBCUTANEOUS
  Administered 2017-02-19: 7 [IU] via SUBCUTANEOUS

## 2017-02-18 MED ORDER — POTASSIUM CHLORIDE CRYS ER 20 MEQ PO TBCR
40.0000 meq | EXTENDED_RELEASE_TABLET | Freq: Once | ORAL | Status: AC
  Administered 2017-02-18: 40 meq via ORAL
  Filled 2017-02-18: qty 2

## 2017-02-18 NOTE — Progress Notes (Signed)
Pt c/o of headache; irritable. Administered Tylenol 650 mg. Assisted to BR. Tolerated well. Conducted NIH stroke scale; score unchanged from previous.

## 2017-02-18 NOTE — Progress Notes (Signed)
Subjective: Brady Church reports improvement in his speech and right arm numbness today. He is able to answer questions with less difficulty. He has been able to eat and drink without difficulty. No other complaints.   Objective: Vital signs in last 24 hours: Vitals:   02/17/17 1739 02/17/17 2108 02/18/17 0108 02/18/17 0443  BP: 109/64 (!) 148/102 120/72 (!) 160/82  Pulse: 95 85 75 74  Resp: 18  18 18   Temp: 98.2 F (36.8 C) 98.4 F (36.9 C) 98.2 F (36.8 C) 98.2 F (36.8 C)  TempSrc: Oral Oral Oral Oral  SpO2: 98% 98% 98% 98%  Weight:      Height:        Intake/Output Summary (Last 24 hours) at 02/18/17 9937 Last data filed at 02/17/17 2100  Gross per 24 hour  Intake             1160 ml  Output              950 ml  Net              210 ml    Physical Exam General appearance: Sitting up in bed eating breakfast, no acute distress membranes Eyes: Blind bilaterally, injected sclera bilaterally Cardiovascular: Regular rate and rhythm, no murmurs, rubs, gallops Respiratory: Clear to auscultation bilaterally, normal work of breathing Abdomen: Soft, non-tender, mildly distended Extremities: Normal bulk and range of motion, no edema, 2+ peripheral pulses Skin: Warm, dry, intact Neuro: Alert and oriented x4, blind bilaterally, otherwise cranial nerves intact, RUE diffusely numb to palpation or pinprick to shoulder (improved since yesterday), strength intact throughout  Labs / Imaging / Procedures: CBC Latest Ref Rng & Units 02/16/2017 02/16/2017 07/05/2016  WBC 4.0 - 10.5 K/uL - 6.5 5.8  Hemoglobin 13.0 - 17.0 g/dL 16.9 67.8 -  Hematocrit 39.0 - 52.0 % 41.0 39.1 40.1  Platelets 150 - 400 K/uL - 254 267   BMP Latest Ref Rng & Units 02/18/2017 02/16/2017 02/16/2017  Glucose 65 - 99 mg/dL 938(B) 017(PZ) 025(E)  BUN 6 - 20 mg/dL 9 15 13   Creatinine 0.61 - 1.24 mg/dL 5.27 7.82 4.23  BUN/Creat Ratio 9 - 20 - - -  Sodium 135 - 145 mmol/L 133(L) 134(L) 132(L)  Potassium 3.5 - 5.1  mmol/L 3.3(L) 3.7 3.8  Chloride 101 - 111 mmol/L 97(L) 96(L) 96(L)  CO2 22 - 32 mmol/L 24 - 24  Calcium 8.9 - 10.3 mg/dL 8.9 - 8.8(L)    Mr Brain Wo Contrast / Mr Maxine Glenn Head/brain Wo Cm  IMPRESSION: MRI HEAD IMPRESSION: 1. 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 No significant mass effect. 2. Mildly advanced cerebral atrophy for patient age with moderate chronic microvascular ischemic disease.   MRA HEAD IMPRESSION: 1. Negative MRA for large or proximal arterial branch occlusion. 2. 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 3. Probable multifocal atheromatous disease involving the PCAs bilaterally, right greater than left. 4. Irregularity about the distal basilar artery, likely combination of motion and vessel tortuosity, poorly evaluated on this exam. A follow-up study to ensure no underlying aneurysm or vascular abnormality is present is suggested. Electronically Signed   By: Rise Mu M.D.   On: 02/16/2017 22:44   CTA neck: 1. Negative for emergent large vessel occlusion or MCA branch occlusion. Positive for moderate to severe irregularity and stenosis of the origin of the posterior left M2 branch (series 507, image 27). 2. Moderate to severe  atherosclerosis and stenosis also of the: Left ICA siphon, Right PCA P2 segment, and Left vertebral artery origin. 3. Bilateral CCA and cervical ICA soft and calcified atherosclerosis, and tortuosity, but no significant cervical carotid stenosis. 4. Normal anatomic variation including distal third Basilar artery right side fenestration and innominate branching. 5. Expected evolution of the posterior left MCA infarct. No associated hemorrhage or mass effect. 6. No new acute intracranial abnormality.   Assessment/Plan: Brady Church is a 56 yo man with PMH DM, HTN, HLD, CHF, tobacco and cocaine use admitted for acute stroke.    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 His expressive aphasia and RUE numbness seem to be improving. Plan is transfer to inpatient rehab.  -- Appreciate neurology recs -- Permissive HTN up to SBP 220, DBP 110 -- Asa 325 mg QD and Plavix 75 mg QD for 3 months, then Plavix alone -- Lipitor 80 mg QD -- CIR eval pending  -- Encourage tobacco, cocaine, alcohol cessation  Elevated troponins, 0.21 on admission with new diffuse T wave inversion on EKG, cocaine positive UDS, no chest pain, dyspnea, palpitations. Patient seen by cardiology, CP not thought to be 2/2 ACS. Echo with normal systolic function and no regional wall motion abnormalities.  -- Appreciate Cardiology recs -- AM EKG pending  -- Telemetry  DM2, CBG in 200s, on Metformin at home but not taking this for 2-3 weeks. -- SSI-M and CBGs TID AC HS  Hypokalemia, K 3.3 this am -K-Dur 40 meq once  -BMP in am   Daily alcohol use, reports 40 oz beer daily -- CIWA protocol -- Multivitamin, Thiamine, Folate  FEN/GI: HH CM diet, replete electrolytes as needed  DVT ppx: Lovenox  Dispo: Anticipated discharge in approximately 1-2 day(s).   LOS: 2 days   John Giovanni, MD 02/18/2017, 7:28 AM Pager: [phone]

## 2017-02-18 NOTE — Progress Notes (Signed)
Progress Note  Patient Name: Brady Church Date of Encounter: 02/18/2017  Primary Cardiologist: Dr. Duke Salvia  Subjective   Denies chest pain or SOB   Inpatient Medications    Scheduled Meds: . aspirin EC  325 mg Oral Daily  . atorvastatin  80 mg Oral q1800  . clopidogrel  75 mg Oral Daily  . Difluprednate  1 drop Both Eyes BID  . dorzolamide-timolol  1 drop Right Eye BID  . enoxaparin (LOVENOX) injection  40 mg Subcutaneous QHS  . folic acid  1 mg Oral Daily  . insulin aspart  0-15 Units Subcutaneous TID WC  . multivitamin with minerals  1 tablet Oral Daily  . potassium chloride      . thiamine  100 mg Oral Daily   Continuous Infusions:  PRN Meds: acetaminophen **OR** acetaminophen (TYLENOL) oral liquid 160 mg/5 mL **OR** acetaminophen, LORazepam **OR** LORazepam, senna-docusate   Vital Signs    Vitals:   02/17/17 1739 02/17/17 2108 02/18/17 0108 02/18/17 0443  BP: 109/64 (!) 148/102 120/72 (!) 160/82  Pulse: 95 85 75 74  Resp: 18  18 18   Temp: 98.2 F (36.8 C) 98.4 F (36.9 C) 98.2 F (36.8 C) 98.2 F (36.8 C)  TempSrc: Oral Oral Oral Oral  SpO2: 98% 98% 98% 98%  Weight:      Height:        Intake/Output Summary (Last 24 hours) at 02/18/17 1021 Last data filed at 02/17/17 2100  Gross per 24 hour  Intake             1160 ml  Output              950 ml  Net              210 ml   Filed Weights   02/17/17 0020  Weight: 215 lb 4.8 oz (97.7 kg)     TELE    NSR.  Personally reviewed.  02/18/2017  Physical Exam   GEN: No acute distress.  Pleasant Neck: No  JVD Cardiac: RRR, no murmurs, rubs Respiratory: Clear  to auscultation bilaterally. GI: Soft, nontender, non-distended  MS: No  edema; No deformity. Neuro:  Nonfocal, dysarthria improved Psych: Normal affect   Labs    Chemistry  Recent Labs Lab 02/16/17 1520 02/16/17 1529 02/18/17 0425  NA 132* 134* 133*  K 3.8 3.7 3.3*  CL 96* 96* 97*  CO2 24  --  24  GLUCOSE 499* 501* 290*    BUN 13 15 9   CREATININE 1.13 0.90 0.88  CALCIUM 8.8*  --  8.9  PROT 6.5  --   --   ALBUMIN 3.6  --   --   AST 25  --   --   ALT 18  --   --   ALKPHOS 67  --   --   BILITOT 0.8  --   --   GFRNONAA >60  --  >60  GFRAA >60  --  >60  ANIONGAP 12  --  12     Hematology  Recent Labs Lab 02/16/17 1520 02/16/17 1529  WBC 6.5  --   RBC 4.13*  --   HGB 13.6 13.9  HCT 39.1 41.0  MCV 94.7  --   MCH 32.9  --   MCHC 34.8  --   RDW 14.6  --   PLT 254  --     Cardiac Enzymes  Recent Labs Lab 02/16/17 2315 02/17/17 0448 02/17/17 0956  TROPONINI 0.20*  0.18* 0.16*     Recent Labs Lab 02/16/17 1529  TROPIPOC 0.21*     BNPNo results for input(s): BNP, PROBNP in the last 168 hours.   DDimer No results for input(s): DDIMER in the last 168 hours.   Radiology    Ct Angio Head W Or Wo Contrast  Result Date: 02/17/2017 CLINICAL DATA:  56 year old male with posterior left MCA infarct, suspected high-grade left M2 branch stenosis. EXAM: CT ANGIOGRAPHY HEAD AND NECK TECHNIQUE: Multidetector CT imaging of the head and neck was performed using the standard protocol during bolus administration of intravenous contrast. Multiplanar CT image reconstructions and MIPs were obtained to evaluate the vascular anatomy. Carotid stenosis measurements (when applicable) are obtained utilizing NASCET criteria, using the distal internal carotid diameter as the denominator. CONTRAST:  50 mL Isovue 370 COMPARISON:  Brain MRI and intracranial MRA 02/16/2017, noncontrast head CT 02/16/2017. FINDINGS: CT HEAD FINDINGS Brain: Hypodense cytotoxic edema has developed in the left parietal lobe/peri-Rolandic cortex corresponding to the diffusion abnormality yesterday. No associated hemorrhage. No intracranial mass effect. Elsewhere stable gray-white matter differentiation. No ventriculomegaly. Calvarium and skull base: No acute osseous abnormality identified. Paranasal sinuses: Mild bubbly opacity now on the left  sphenoid sinus. Elsewhere stable mild paranasal sinus mucosal thickening. Orbits: Stable orbit and scalp soft tissues. CTA NECK Skeleton: No acute osseous abnormality identified. Mild reversal of cervical lordosis. Upper chest: Mild upper lobe dependent atelectasis. No superior mediastinal lymphadenopathy. Other neck: Thyroid, parapharyngeal, retropharyngeal and sublingual spaces are within normal limits. The pharynx and larynx are partially effaced, probably in part due to mild retained secretions. Negative submandibular glands and parotid glands. No cervical lymphadenopathy. Aortic arch: 3 vessel arch configuration, although the left vertebral artery arises from the very proximal left subclavian. No significant arch atherosclerosis. Right carotid system: Negative right CCA origin. Mildly tortuous proximal right CCA. At the level of the hypopharynx there is medial and anterior soft plaque without stenosis (series 501, image 68). Mild soft and calcified plaque at the right ICA origin. Tortuous proximal right ICA with less than 50 % stenosis with respect to the distal vessel at the proximal bulb (series 508, image 54). Tortuosity within the bulb. Otherwise negative cervical right ICA. Left carotid system: Tortuous proximal left CCA with a mildly kinked appearance. Anterior soft plaque at the level of the hypopharynx without stenosis (series 501, image 67). Soft plaque at the left carotid bifurcation and left ICA origin without stenosis. Soft and calcified plaque at the bulb which is partially retropharyngeal and mildly ectatic (series 505, image 94). No stenosis. The more distal cervical left ICA is negative. Vertebral arteries: No proximal right subclavian artery stenosis in the right vertebral artery origin occurs early without stenosis. Tortuous right V1 segment. Late entry of the right vertebral artery into the cervical transverse foramen. Negative right vertebral artery to the skullbase. No proximal left  subclavian artery stenosis and the left vertebral artery origin occurs early. There is severe left vertebral origin stenosis related to soft plaque, best seen on series 505, image 84. The left V1 segment is then tortuous. The left vertebral is otherwise negative to the skullbase. CTA HEAD Posterior circulation: Fairly codominant distal vertebral arteries with no V4 stenosis. Patent vertebrobasilar junction. A ICAs appear dominant, the left is duplicated. No basilar stenosis. The right distal basilar artery has an unusual fenestration and branching of an innominate vessel as seen on series 505, image 111. This does not appear to be a vascular malformation. The SCA and left PCA  origins are normal. Fetal type right PCA origin. Diminutive left posterior communicating artery. The left PCA branches are within normal limits. There is moderate to severe irregularity and stenosis of the right PCA P2 best seen on series 503, image 51. Anterior circulation: Both ICA siphons are patent. There is moderate to severe cavernous and supraclinoid calcified plaque worse on the left. Moderate stenosis of both the distal cavernous and proximal supraclinoid ICA segments (series 508, image 105). Left ophthalmic and posterior communicating artery origins are normal. On the right side there is no hemodynamically significant siphon stenosis. Right ophthalmic and posterior communicating artery origins are normal. Normal carotid termini, MCA and ACA origins. Dominant right ACA A1 segment. Anterior communicating artery and bilateral ACA branches are normal. Right MCA M1 segment, bifurcation, and right MCA branches are within normal limits. Left MCA M1 segment and left MCA bifurcation are patent. There is moderate to severe irregularity and stenosis at the origin of the dominant posterior left M2 branch as seen on series 503, image 52 and series 507, image 27. However, no left MCA branch occlusion is identified. Venous sinuses: Grossly normal.  Anatomic variants: Proximal origin of both vertebral arteries. Partially retropharyngeal course of the left carotid. Unusual fenestration and right side innominate branching of the distal third basilar artery. Fetal type right PCA origin. Dominant right A1 segment. Delayed phase: No abnormal enhancement identified. Review of the MIP images confirms the above findings IMPRESSION: 1. Negative for emergent large vessel occlusion or MCA branch occlusion. Positive for moderate to severe irregularity and stenosis of the origin of the posterior left M2 branch (series 507, image 27). 2. Moderate to severe atherosclerosis and stenosis also of the: Left ICA siphon, Right PCA P2 segment, and Left vertebral artery origin. 3. Bilateral CCA and cervical ICA soft and calcified atherosclerosis, and tortuosity, but no significant cervical carotid stenosis. 4. Normal anatomic variation including distal third Basilar artery right side fenestration and innominate branching. 5. Expected evolution of the posterior left MCA infarct. No associated hemorrhage or mass effect. 6. No new acute intracranial abnormality. Electronically Signed   By: Odessa Fleming M.D.   On: 02/17/2017 13:28   Ct Head Wo Contrast  Result Date: 02/16/2017 CLINICAL DATA:  Altered mental status, hypertension, diabetes mellitus, smoker EXAM: CT HEAD WITHOUT CONTRAST TECHNIQUE: Contiguous axial images were obtained from the base of the skull through the vertex without intravenous contrast. COMPARISON:  06/14/2008 FINDINGS: Brain: Generalized atrophy. Normal ventricular morphology. No midline shift or mass effect. Small vessel chronic ischemic changes of deep cerebral white matter. Probable tiny old basal ganglia lacunar infarcts. No intracranial hemorrhage, mass lesion, evidence of acute infarction, or extra-axial fluid collection. Vascular: Atherosclerotic calcifications at the carotid siphons Skull: Intact Sinuses/Orbits: Clear Other: [name] IMPRESSION: Atrophy with small  vessel chronic ischemic changes of deep cerebral white matter. Probable small old lacunar infarcts at the basal ganglia. No acute intracranial abnormalities. Electronically Signed   By: Ulyses Southward M.D.   On: 02/16/2017 16:09   Ct Angio Neck W Or Wo Contrast  Result Date: 02/17/2017 CLINICAL DATA:  56 year old male with posterior left MCA infarct, suspected high-grade left M2 branch stenosis. EXAM: CT ANGIOGRAPHY HEAD AND NECK TECHNIQUE: Multidetector CT imaging of the head and neck was performed using the standard protocol during bolus administration of intravenous contrast. Multiplanar CT image reconstructions and MIPs were obtained to evaluate the vascular anatomy. Carotid stenosis measurements (when applicable) are obtained utilizing NASCET criteria, using the distal internal carotid diameter as the denominator.  CONTRAST:  50 mL Isovue 370 COMPARISON:  Brain MRI and intracranial MRA 02/16/2017, noncontrast head CT 02/16/2017. FINDINGS: CT HEAD FINDINGS Brain: Hypodense cytotoxic edema has developed in the left parietal lobe/peri-Rolandic cortex corresponding to the diffusion abnormality yesterday. No associated hemorrhage. No intracranial mass effect. Elsewhere stable gray-white matter differentiation. No ventriculomegaly. Calvarium and skull base: No acute osseous abnormality identified. Paranasal sinuses: Mild bubbly opacity now on the left sphenoid sinus. Elsewhere stable mild paranasal sinus mucosal thickening. Orbits: Stable orbit and scalp soft tissues. CTA NECK Skeleton: No acute osseous abnormality identified. Mild reversal of cervical lordosis. Upper chest: Mild upper lobe dependent atelectasis. No superior mediastinal lymphadenopathy. Other neck: Thyroid, parapharyngeal, retropharyngeal and sublingual spaces are within normal limits. The pharynx and larynx are partially effaced, probably in part due to mild retained secretions. Negative submandibular glands and parotid glands. No cervical  lymphadenopathy. Aortic arch: 3 vessel arch configuration, although the left vertebral artery arises from the very proximal left subclavian. No significant arch atherosclerosis. Right carotid system: Negative right CCA origin. Mildly tortuous proximal right CCA. At the level of the hypopharynx there is medial and anterior soft plaque without stenosis (series 501, image 68). Mild soft and calcified plaque at the right ICA origin. Tortuous proximal right ICA with less than 50 % stenosis with respect to the distal vessel at the proximal bulb (series 508, image 54). Tortuosity within the bulb. Otherwise negative cervical right ICA. Left carotid system: Tortuous proximal left CCA with a mildly kinked appearance. Anterior soft plaque at the level of the hypopharynx without stenosis (series 501, image 67). Soft plaque at the left carotid bifurcation and left ICA origin without stenosis. Soft and calcified plaque at the bulb which is partially retropharyngeal and mildly ectatic (series 505, image 94). No stenosis. The more distal cervical left ICA is negative. Vertebral arteries: No proximal right subclavian artery stenosis in the right vertebral artery origin occurs early without stenosis. Tortuous right V1 segment. Late entry of the right vertebral artery into the cervical transverse foramen. Negative right vertebral artery to the skullbase. No proximal left subclavian artery stenosis and the left vertebral artery origin occurs early. There is severe left vertebral origin stenosis related to soft plaque, best seen on series 505, image 84. The left V1 segment is then tortuous. The left vertebral is otherwise negative to the skullbase. CTA HEAD Posterior circulation: Fairly codominant distal vertebral arteries with no V4 stenosis. Patent vertebrobasilar junction. A ICAs appear dominant, the left is duplicated. No basilar stenosis. The right distal basilar artery has an unusual fenestration and branching of an innominate  vessel as seen on series 505, image 111. This does not appear to be a vascular malformation. The SCA and left PCA origins are normal. Fetal type right PCA origin. Diminutive left posterior communicating artery. The left PCA branches are within normal limits. There is moderate to severe irregularity and stenosis of the right PCA P2 best seen on series 503, image 51. Anterior circulation: Both ICA siphons are patent. There is moderate to severe cavernous and supraclinoid calcified plaque worse on the left. Moderate stenosis of both the distal cavernous and proximal supraclinoid ICA segments (series 508, image 105). Left ophthalmic and posterior communicating artery origins are normal. On the right side there is no hemodynamically significant siphon stenosis. Right ophthalmic and posterior communicating artery origins are normal. Normal carotid termini, MCA and ACA origins. Dominant right ACA A1 segment. Anterior communicating artery and bilateral ACA branches are normal. Right MCA M1 segment, bifurcation, and  right MCA branches are within normal limits. Left MCA M1 segment and left MCA bifurcation are patent. There is moderate to severe irregularity and stenosis at the origin of the dominant posterior left M2 branch as seen on series 503, image 52 and series 507, image 27. However, no left MCA branch occlusion is identified. Venous sinuses: Grossly normal. Anatomic variants: Proximal origin of both vertebral arteries. Partially retropharyngeal course of the left carotid. Unusual fenestration and right side innominate branching of the distal third basilar artery. Fetal type right PCA origin. Dominant right A1 segment. Delayed phase: No abnormal enhancement identified. Review of the MIP images confirms the above findings IMPRESSION: 1. Negative for emergent large vessel occlusion or MCA branch occlusion. Positive for moderate to severe irregularity and stenosis of the origin of the posterior left M2 branch (series 507,  image 27). 2. Moderate to severe atherosclerosis and stenosis also of the: Left ICA siphon, Right PCA P2 segment, and Left vertebral artery origin. 3. Bilateral CCA and cervical ICA soft and calcified atherosclerosis, and tortuosity, but no significant cervical carotid stenosis. 4. Normal anatomic variation including distal third Basilar artery right side fenestration and innominate branching. 5. Expected evolution of the posterior left MCA infarct. No associated hemorrhage or mass effect. 6. No new acute intracranial abnormality. Electronically Signed   By: Odessa Fleming M.D.   On: 02/17/2017 13:28   Mr Brain Wo Contrast  Result Date: 02/16/2017 CLINICAL DATA:  Initial evaluation for acute slurred speech, right-sided numbness. EXAM: MRI HEAD WITHOUT CONTRAST MRA HEAD WITHOUT CONTRAST TECHNIQUE: Multiplanar, multiecho pulse sequences of the brain and surrounding structures were obtained without intravenous contrast. Angiographic images of the head were obtained using MRA technique without contrast. COMPARISON:  Prior CT from earlier the same day. FINDINGS: MRI HEAD FINDINGS Brain: Mildly advanced cerebral atrophy for patient age. Patchy T2/FLAIR hyperintensity within the periventricular and deep white matter both cerebral hemispheres most consistent with chronic microvascular ischemic disease, moderate in nature. Abnormal restricted diffusion involving the cortical gray matter of the left parietal lobe, compatible with acute left MCA territory infarct (series 3, image 41). No associated hemorrhage. No mass effect. No other evidence for acute or subacute ischemia. Subcentimeter focus is susceptibility artifact within the left cerebellar hemisphere noted, compatible with a small chronic microhemorrhage. No other evidence for acute or chronic intracranial hemorrhage. No other areas of remote cortical infarction identified. No mass lesion, midline shift, or mass effect. No hydrocephalus. No extra-axial fluid collection.  Major dural sinuses are grossly patent. Pituitary gland within normal limits. Vascular: Major intracranial vascular flow voids maintained. Skull and upper cervical spine: Craniocervical junction within normal limits. Visualized upper cervical spine unremarkable. Bone marrow signal intensity within normal limits. No scalp soft tissue abnormality. Sinuses/Orbits: Globes and orbital soft tissues within normal limits. Patient status post lens extraction on the right. Scattered mucosal thickening throughout the paranasal sinuses. No significant mastoid effusion. Inner ear structures within normal limits. Other: No other significant finding. MRA HEAD FINDINGS ANTERIOR CIRCULATION: Study degraded by motion artifact. Distal cervical segments of the internal carotid arteries are patent with antegrade flow. Petrous segments patent bilaterally. Cavernous and supraclinoid right ICA widely patent. There is moderate atheromatous narrowing at the cavernous left ICA at the level of the anterior genu (series 14, image 73). ICA termini patent bilaterally. Right A1 segment dominant and widely patent. Left A1 segment hypoplastic, which likely accounts for the slightly diminutive left ICA is compared to the right. Anterior communicating artery grossly normal. Anterior cerebral arteries  patent to their distal aspects. M1 segments patent without stenosis or occlusion. Possible proximal severe left M2 stenosis (series 452, image 5). Distal MCA branches well opacified distally and fairly symmetric. POSTERIOR CIRCULATION: Vertebral arteries patent to the vertebrobasilar junction. Posterior inferior cerebral arteries not well evaluated on this exam. Basilar artery patent to its distal aspect. Irregularity about the distal basilar artery on reconstructions, likely combination of motion and vessel tortuosity, poorly evaluated on this exam. Left PCA appears to be supplied primarily via the basilar and is patent to its distal aspect. Right P1  segment appears hypoplastic with a prominent right posterior communicating artery present. Right PCA also grossly patent to its distal aspect. Multifocal atheromatous irregularity suspected. IMPRESSION: MRI HEAD IMPRESSION: 1. 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 No significant mass effect. 2. Mildly advanced cerebral atrophy for patient age with moderate chronic microvascular ischemic disease. MRA HEAD IMPRESSION: 1. Negative MRA for large or proximal arterial branch occlusion. 2. 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 3. Probable multifocal atheromatous disease involving the PCAs bilaterally, right greater than left. 4. Irregularity about the distal basilar artery, likely combination of motion and vessel tortuosity, poorly evaluated on this exam. A follow-up study to ensure no underlying aneurysm or vascular abnormality is present is suggested. Electronically Signed   By: Rise Mu M.D.   On: 02/16/2017 22:44   Dg Chest Port 1 View  Result Date: 02/16/2017 CLINICAL DATA:  Altered mental status. EXAM: PORTABLE CHEST 1 VIEW COMPARISON:  04/19/2015. FINDINGS: Mediastinum and hilar structures normal. Cardiomegaly with pulmonary vascular prominence. No focal infiltrate. No pleural effusion or pneumothorax. No acute bony abnormality. IMPRESSION: Cardiomegaly.  No evidence CHF.  No focal pulmonary infiltrate. Electronically Signed   By: [name]  Register   On: 02/16/2017 15:57   Mr Maxine Glenn Head/brain Wo Cm  Result Date: 02/16/2017 CLINICAL DATA:  Initial evaluation for acute slurred speech, right-sided numbness. EXAM: MRI HEAD WITHOUT CONTRAST MRA HEAD WITHOUT CONTRAST TECHNIQUE: Multiplanar, multiecho pulse sequences of the brain and surrounding structures were obtained without intravenous contrast. Angiographic images of the head were obtained using MRA technique without contrast. COMPARISON:   Prior CT from earlier the same day. FINDINGS: MRI HEAD FINDINGS Brain: Mildly advanced cerebral atrophy for patient age. Patchy T2/FLAIR hyperintensity within the periventricular and deep white matter both cerebral hemispheres most consistent with chronic microvascular ischemic disease, moderate in nature. Abnormal restricted diffusion involving the cortical gray matter of the left parietal lobe, compatible with acute left MCA territory infarct (series 3, image 41). No associated hemorrhage. No mass effect. No other evidence for acute or subacute ischemia. Subcentimeter focus is susceptibility artifact within the left cerebellar hemisphere noted, compatible with a small chronic microhemorrhage. No other evidence for acute or chronic intracranial hemorrhage. No other areas of remote cortical infarction identified. No mass lesion, midline shift, or mass effect. No hydrocephalus. No extra-axial fluid collection. Major dural sinuses are grossly patent. Pituitary gland within normal limits. Vascular: Major intracranial vascular flow voids maintained. Skull and upper cervical spine: Craniocervical junction within normal limits. Visualized upper cervical spine unremarkable. Bone marrow signal intensity within normal limits. No scalp soft tissue abnormality. Sinuses/Orbits: Globes and orbital soft tissues within normal limits. Patient status post lens extraction on the right. Scattered mucosal thickening throughout the paranasal sinuses. No significant mastoid effusion. Inner ear structures within normal limits. Other: No other significant finding. MRA HEAD FINDINGS ANTERIOR CIRCULATION: Study degraded by motion artifact. Distal  cervical segments of the internal carotid arteries are patent with antegrade flow. Petrous segments patent bilaterally. Cavernous and supraclinoid right ICA widely patent. There is moderate atheromatous narrowing at the cavernous left ICA at the level of the anterior genu (series 14, image 73). ICA  termini patent bilaterally. Right A1 segment dominant and widely patent. Left A1 segment hypoplastic, which likely accounts for the slightly diminutive left ICA is compared to the right. Anterior communicating artery grossly normal. Anterior cerebral arteries patent to their distal aspects. M1 segments patent without stenosis or occlusion. Possible proximal severe left M2 stenosis (series 452, image 5). Distal MCA branches well opacified distally and fairly symmetric. POSTERIOR CIRCULATION: Vertebral arteries patent to the vertebrobasilar junction. Posterior inferior cerebral arteries not well evaluated on this exam. Basilar artery patent to its distal aspect. Irregularity about the distal basilar artery on reconstructions, likely combination of motion and vessel tortuosity, poorly evaluated on this exam. Left PCA appears to be supplied primarily via the basilar and is patent to its distal aspect. Right P1 segment appears hypoplastic with a prominent right posterior communicating artery present. Right PCA also grossly patent to its distal aspect. Multifocal atheromatous irregularity suspected. IMPRESSION: MRI HEAD IMPRESSION: 1. 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 No significant mass effect. 2. Mildly advanced cerebral atrophy for patient age with moderate chronic microvascular ischemic disease. MRA HEAD IMPRESSION: 1. Negative MRA for large or proximal arterial branch occlusion. 2. 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 3. Probable multifocal atheromatous disease involving the PCAs bilaterally, right greater than left. 4. Irregularity about the distal basilar artery, likely combination of motion and vessel tortuosity, poorly evaluated on this exam. A follow-up study to ensure no underlying aneurysm or vascular abnormality is present is suggested. Electronically Signed   By: Rise Mu M.D.    On: 02/16/2017 22:44    Cardiac Studies   Echo 02/17/17  - Left ventricle: The cavity size was normal. Wall thickness was   increased in a pattern of moderate LVH. Systolic function was   vigorous. The estimated ejection fraction was in the range of 65%   to 70%. Wall motion was normal; there were no regional wall   motion abnormalities. Doppler parameters are consistent with   abnormal left ventricular relaxation (grade 1 diastolic   dysfunction). - Aortic valve: Valve area (VTI): 3.57 cm^2. Valve area (Vmax):   3.12 cm^2. Valve area (Vmean): 2.85 cm^2. - Atrial septum: No defect or patent foramen ovale was identified. - Technically adequate study.  Patient Profile     56 y.o. male a history of  Hypertension, (Echo 12/2014; chronic systolic heart failure EF 40-45%. Hypokineses of inferolateral myocardium, grade 1 diastolic function. Lexiscan 07/2015; LVEF 30-44%, nuclear EF 40%, no ST segment deviation during stress, defect 1, small defect of mild severity, intermediate risk study, hypercholesterolemia, diabetes, cocaine use (last used a small amount yesterday and smoked a joint), current everyday 0.50 packs/day, drinks 40oz of beer every other day.  He presented with slurred speech without acute findings on head CT.  We were called secondary to elevated troponin.    Assessment & Plan    ELEVATED TROPONIN:   Not thought initially to be an acute coronary syndrome.  Troponins have trended down (slightly flat) in a non diagnostic pattern.    Echo without new wall motion abnormalities.  No further cardiac work up.  Discontinue tele  HTN:    BP is running high  but neuro prefers some permissive HTN.  No change in meds.    CHRONIC SYSTOLIC HF:   Seems to be euvolemic. No change in meds.       Signed, Rollene Rotunda, MD  02/18/2017, 10:21 AM

## 2017-02-19 ENCOUNTER — Inpatient Hospital Stay (HOSPITAL_COMMUNITY)
Admission: RE | Admit: 2017-02-19 | Discharge: 2017-02-27 | DRG: 057 | Disposition: A | Discharge status: Home Health | Payer: Medicaid Other | Source: Intra-hospital | Attending: Physical Medicine & Rehabilitation | Admitting: Physical Medicine & Rehabilitation

## 2017-02-19 ENCOUNTER — Telehealth: Payer: Self-pay

## 2017-02-19 DIAGNOSIS — Z79899 Other long term (current) drug therapy: Secondary | ICD-10-CM | POA: Diagnosis not present

## 2017-02-19 DIAGNOSIS — E1165 Type 2 diabetes mellitus with hyperglycemia: Secondary | ICD-10-CM

## 2017-02-19 DIAGNOSIS — I110 Hypertensive heart disease with heart failure: Secondary | ICD-10-CM | POA: Diagnosis not present

## 2017-02-19 DIAGNOSIS — I69390 Apraxia following cerebral infarction: Secondary | ICD-10-CM | POA: Diagnosis not present

## 2017-02-19 DIAGNOSIS — F17210 Nicotine dependence, cigarettes, uncomplicated: Secondary | ICD-10-CM | POA: Diagnosis not present

## 2017-02-19 DIAGNOSIS — I4581 Long QT syndrome: Secondary | ICD-10-CM

## 2017-02-19 DIAGNOSIS — I639 Cerebral infarction, unspecified: Secondary | ICD-10-CM

## 2017-02-19 DIAGNOSIS — I69398 Other sequelae of cerebral infarction: Secondary | ICD-10-CM

## 2017-02-19 DIAGNOSIS — K5900 Constipation, unspecified: Secondary | ICD-10-CM

## 2017-02-19 DIAGNOSIS — Z7982 Long term (current) use of aspirin: Secondary | ICD-10-CM | POA: Diagnosis not present

## 2017-02-19 DIAGNOSIS — I69322 Dysarthria following cerebral infarction: Principal | ICD-10-CM

## 2017-02-19 DIAGNOSIS — R260 Ataxic gait: Secondary | ICD-10-CM

## 2017-02-19 DIAGNOSIS — H548 Legal blindness, as defined in USA: Secondary | ICD-10-CM

## 2017-02-19 DIAGNOSIS — F4323 Adjustment disorder with mixed anxiety and depressed mood: Secondary | ICD-10-CM | POA: Diagnosis not present

## 2017-02-19 DIAGNOSIS — E871 Hypo-osmolality and hyponatremia: Secondary | ICD-10-CM

## 2017-02-19 DIAGNOSIS — E1136 Type 2 diabetes mellitus with diabetic cataract: Secondary | ICD-10-CM | POA: Diagnosis not present

## 2017-02-19 DIAGNOSIS — R55 Syncope and collapse: Secondary | ICD-10-CM

## 2017-02-19 DIAGNOSIS — I63512 Cerebral infarction due to unspecified occlusion or stenosis of left middle cerebral artery: Secondary | ICD-10-CM | POA: Diagnosis present

## 2017-02-19 DIAGNOSIS — R9431 Abnormal electrocardiogram [ECG] [EKG]: Secondary | ICD-10-CM

## 2017-02-19 DIAGNOSIS — E7800 Pure hypercholesterolemia, unspecified: Secondary | ICD-10-CM | POA: Diagnosis not present

## 2017-02-19 DIAGNOSIS — I10 Essential (primary) hypertension: Secondary | ICD-10-CM | POA: Diagnosis present

## 2017-02-19 DIAGNOSIS — I69351 Hemiplegia and hemiparesis following cerebral infarction affecting right dominant side: Secondary | ICD-10-CM

## 2017-02-19 DIAGNOSIS — I5022 Chronic systolic (congestive) heart failure: Secondary | ICD-10-CM | POA: Diagnosis not present

## 2017-02-19 DIAGNOSIS — F1410 Cocaine abuse, uncomplicated: Secondary | ICD-10-CM

## 2017-02-19 DIAGNOSIS — I69320 Aphasia following cerebral infarction: Secondary | ICD-10-CM | POA: Diagnosis not present

## 2017-02-19 DIAGNOSIS — K5901 Slow transit constipation: Secondary | ICD-10-CM | POA: Diagnosis not present

## 2017-02-19 DIAGNOSIS — Z7984 Long term (current) use of oral hypoglycemic drugs: Secondary | ICD-10-CM | POA: Diagnosis not present

## 2017-02-19 DIAGNOSIS — T502X5A Adverse effect of carbonic-anhydrase inhibitors, benzothiadiazides and other diuretics, initial encounter: Secondary | ICD-10-CM

## 2017-02-19 DIAGNOSIS — H409 Unspecified glaucoma: Secondary | ICD-10-CM | POA: Diagnosis not present

## 2017-02-19 DIAGNOSIS — T464X5A Adverse effect of angiotensin-converting-enzyme inhibitors, initial encounter: Secondary | ICD-10-CM

## 2017-02-19 DIAGNOSIS — I69920 Aphasia following unspecified cerebrovascular disease: Secondary | ICD-10-CM | POA: Diagnosis not present

## 2017-02-19 DIAGNOSIS — G8191 Hemiplegia, unspecified affecting right dominant side: Secondary | ICD-10-CM | POA: Diagnosis not present

## 2017-02-19 DIAGNOSIS — E785 Hyperlipidemia, unspecified: Secondary | ICD-10-CM | POA: Diagnosis not present

## 2017-02-19 DIAGNOSIS — I669 Occlusion and stenosis of unspecified cerebral artery: Secondary | ICD-10-CM

## 2017-02-19 LAB — HEMOGLOBIN A1C
Hgb A1c MFr Bld: 14.4 % — ABNORMAL HIGH (ref 4.8–5.6)
Mean Plasma Glucose: 367 mg/dL

## 2017-02-19 LAB — GLUCOSE, CAPILLARY
GLUCOSE-CAPILLARY: 292 mg/dL — AB (ref 65–99)
Glucose-Capillary: 255 mg/dL — ABNORMAL HIGH (ref 65–99)
Glucose-Capillary: 228 mg/dL — ABNORMAL HIGH (ref 65–99)
Glucose-Capillary: 307 mg/dL — ABNORMAL HIGH (ref 65–99)

## 2017-02-19 LAB — BASIC METABOLIC PANEL
Anion gap: 11 (ref 5–15)
BUN: 6 mg/dL (ref 6–20)
CO2: 25 mmol/L (ref 22–32)
CREATININE: 0.78 mg/dL (ref 0.61–1.24)
Calcium: 9.2 mg/dL (ref 8.9–10.3)
Chloride: 97 mmol/L — ABNORMAL LOW (ref 101–111)
GFR calc non Af Amer: >60 mL/min (ref >60)
Glucose, Bld: 232 mg/dL — ABNORMAL HIGH (ref 65–99)
Potassium: 3.6 mmol/L (ref 3.5–5.1)
SODIUM: 133 mmol/L — AB (ref 135–145)

## 2017-02-19 LAB — MAGNESIUM: Magnesium: 1.9 mg/dL (ref 1.7–2.4)

## 2017-02-19 MED ORDER — INSULIN ASPART 100 UNIT/ML ~~LOC~~ SOLN
0.0000 [IU] | Freq: Three times a day (TID) | SUBCUTANEOUS | Status: DC
  Administered 2017-02-19: 15 [IU] via SUBCUTANEOUS
  Administered 2017-02-20: 11 [IU] via SUBCUTANEOUS
  Administered 2017-02-20: 15 [IU] via SUBCUTANEOUS
  Administered 2017-02-20: 11 [IU] via SUBCUTANEOUS
  Administered 2017-02-21: 7 [IU] via SUBCUTANEOUS
  Administered 2017-02-21: 11 [IU] via SUBCUTANEOUS
  Administered 2017-02-21: 4 [IU] via SUBCUTANEOUS
  Administered 2017-02-22: 11 [IU] via SUBCUTANEOUS
  Administered 2017-02-22: 15 [IU] via SUBCUTANEOUS
  Administered 2017-02-22: 4 [IU] via SUBCUTANEOUS
  Administered 2017-02-23: 11 [IU] via SUBCUTANEOUS
  Administered 2017-02-23: 7 [IU] via SUBCUTANEOUS
  Administered 2017-02-23: 3 [IU] via SUBCUTANEOUS
  Administered 2017-02-24 (×2): 4 [IU] via SUBCUTANEOUS
  Administered 2017-02-24 – 2017-02-25 (×2): 7 [IU] via SUBCUTANEOUS
  Administered 2017-02-25 – 2017-02-26 (×5): 4 [IU] via SUBCUTANEOUS
  Administered 2017-02-27: 3 [IU] via SUBCUTANEOUS

## 2017-02-19 MED ORDER — INSULIN ASPART 100 UNIT/ML ~~LOC~~ SOLN: 0.0000 [IU] | Freq: Every day | SUBCUTANEOUS | 11 refills | Status: DC

## 2017-02-19 MED ORDER — ASPIRIN 325 MG PO TBEC: 325.0000 mg | DELAYED_RELEASE_TABLET | Freq: Every day | ORAL | 1 refills | Status: DC

## 2017-02-19 MED ORDER — ASPIRIN EC 325 MG PO TBEC
325.0000 mg | DELAYED_RELEASE_TABLET | Freq: Every day | ORAL | Status: DC
  Administered 2017-02-20 – 2017-02-27 (×8): 325 mg via ORAL
  Filled 2017-02-19 (×8): qty 1

## 2017-02-19 MED ORDER — POTASSIUM CHLORIDE CRYS ER 20 MEQ PO TBCR
40.0000 meq | EXTENDED_RELEASE_TABLET | Freq: Once | ORAL | Status: AC
  Administered 2017-02-19: 40 meq via ORAL
  Filled 2017-02-19: qty 2

## 2017-02-19 MED ORDER — ACETAMINOPHEN 325 MG PO TABS: 650.0000 mg | ORAL_TABLET | ORAL | 0 refills | Status: DC | PRN

## 2017-02-19 MED ORDER — ENOXAPARIN SODIUM 40 MG/0.4ML ~~LOC~~ SOLN
40.0000 mg | SUBCUTANEOUS | Status: DC
  Administered 2017-02-19 – 2017-02-26 (×8): 40 mg via SUBCUTANEOUS
  Filled 2017-02-19 (×8): qty 0.4

## 2017-02-19 MED ORDER — BRIMONIDINE TARTRATE 0.2 % OP SOLN
1.0000 [drp] | Freq: Two times a day (BID) | OPHTHALMIC | Status: DC
  Administered 2017-02-19: 1 [drp] via OPHTHALMIC
  Filled 2017-02-19: qty 5

## 2017-02-19 MED ORDER — VITAMIN B-1 100 MG PO TABS
100.0000 mg | ORAL_TABLET | Freq: Every day | ORAL | Status: DC
  Administered 2017-02-20 – 2017-02-27 (×8): 100 mg via ORAL
  Filled 2017-02-19 (×8): qty 1

## 2017-02-19 MED ORDER — HYDROCHLOROTHIAZIDE 25 MG PO TABS
25.0000 mg | ORAL_TABLET | Freq: Every day | ORAL | Status: DC
  Administered 2017-02-20: 25 mg via ORAL
  Filled 2017-02-19: qty 1

## 2017-02-19 MED ORDER — FOLIC ACID 1 MG PO TABS: 1.0000 mg | ORAL_TABLET | Freq: Every day | ORAL | 0 refills | Status: DC

## 2017-02-19 MED ORDER — BRIMONIDINE TARTRATE 0.2 % OP SOLN: 1.0000 [drp] | Freq: Two times a day (BID) | OPHTHALMIC | 12 refills | Status: DC

## 2017-02-19 MED ORDER — PROCHLORPERAZINE MALEATE 5 MG PO TABS: 5.0000 mg | ORAL_TABLET | Freq: Four times a day (QID) | ORAL | Status: DC | PRN

## 2017-02-19 MED ORDER — INSULIN GLARGINE 100 UNIT/ML ~~LOC~~ SOLN
10.0000 [IU] | Freq: Every day | SUBCUTANEOUS | Status: DC
  Administered 2017-02-19: 10 [IU] via SUBCUTANEOUS
  Filled 2017-02-19: qty 0.1

## 2017-02-19 MED ORDER — DORZOLAMIDE HCL-TIMOLOL MAL 2-0.5 % OP SOLN
1.0000 [drp] | Freq: Two times a day (BID) | OPHTHALMIC | Status: DC
  Administered 2017-02-19 – 2017-02-27 (×14): 1 [drp] via OPHTHALMIC
  Filled 2017-02-19 (×3): qty 10

## 2017-02-19 MED ORDER — ADULT MULTIVITAMIN W/MINERALS CH
1.0000 | ORAL_TABLET | Freq: Every day | ORAL | Status: DC
  Administered 2017-02-20 – 2017-02-27 (×8): 1 via ORAL
  Filled 2017-02-19 (×8): qty 1

## 2017-02-19 MED ORDER — LISINOPRIL 10 MG PO TABS
10.0000 mg | ORAL_TABLET | Freq: Every day | ORAL | Status: DC
  Administered 2017-02-20: 10 mg via ORAL
  Filled 2017-02-19: qty 1

## 2017-02-19 MED ORDER — INSULIN ASPART 100 UNIT/ML ~~LOC~~ SOLN: 0.0000 [IU] | Freq: Three times a day (TID) | SUBCUTANEOUS | 11 refills | Status: DC

## 2017-02-19 MED ORDER — DIFLUPREDNATE 0.05 % OP EMUL: 1.0000 [drp] | Freq: Two times a day (BID) | OPHTHALMIC | Status: DC

## 2017-02-19 MED ORDER — THIAMINE HCL 100 MG PO TABS: 100.0000 mg | ORAL_TABLET | Freq: Every day | ORAL | 0 refills | Status: DC

## 2017-02-19 MED ORDER — DIPHENHYDRAMINE HCL 12.5 MG/5ML PO ELIX: 12.5000 mg | ORAL_SOLUTION | Freq: Four times a day (QID) | ORAL | Status: DC | PRN

## 2017-02-19 MED ORDER — BISACODYL 10 MG RE SUPP: 10.0000 mg | Freq: Every day | RECTAL | Status: DC | PRN

## 2017-02-19 MED ORDER — INSULIN GLARGINE 100 UNIT/ML ~~LOC~~ SOLN: 10.0000 [IU] | Freq: Every day | SUBCUTANEOUS | 11 refills | Status: DC

## 2017-02-19 MED ORDER — LISINOPRIL 10 MG PO TABS: 10.0000 mg | ORAL_TABLET | Freq: Every day | ORAL | 0 refills | Status: DC

## 2017-02-19 MED ORDER — FOLIC ACID 1 MG PO TABS
1.0000 mg | ORAL_TABLET | Freq: Every day | ORAL | Status: DC
  Administered 2017-02-20 – 2017-02-27 (×8): 1 mg via ORAL
  Filled 2017-02-19 (×8): qty 1

## 2017-02-19 MED ORDER — ALUM & MAG HYDROXIDE-SIMETH 200-200-20 MG/5ML PO SUSP: 30.0000 mL | ORAL | Status: DC | PRN

## 2017-02-19 MED ORDER — BRIMONIDINE TARTRATE 0.2 % OP SOLN
1.0000 [drp] | Freq: Two times a day (BID) | OPHTHALMIC | Status: DC
  Administered 2017-02-19 – 2017-02-27 (×16): 1 [drp] via OPHTHALMIC
  Filled 2017-02-19 (×2): qty 5

## 2017-02-19 MED ORDER — PROCHLORPERAZINE EDISYLATE 5 MG/ML IJ SOLN: 5.0000 mg | Freq: Four times a day (QID) | INTRAMUSCULAR | Status: DC | PRN

## 2017-02-19 MED ORDER — ATORVASTATIN CALCIUM 80 MG PO TABS
80.0000 mg | ORAL_TABLET | Freq: Every day | ORAL | Status: DC
  Administered 2017-02-19 – 2017-02-26 (×8): 80 mg via ORAL
  Filled 2017-02-19 (×8): qty 1

## 2017-02-19 MED ORDER — SENNOSIDES-DOCUSATE SODIUM 8.6-50 MG PO TABS: 1.0000 | ORAL_TABLET | Freq: Every evening | ORAL | 0 refills | Status: DC | PRN

## 2017-02-19 MED ORDER — INSULIN ASPART 100 UNIT/ML ~~LOC~~ SOLN
0.0000 [IU] | Freq: Every day | SUBCUTANEOUS | Status: DC
  Administered 2017-02-19: 3 [IU] via SUBCUTANEOUS
  Administered 2017-02-21: 2 [IU] via SUBCUTANEOUS
  Administered 2017-02-21: 3 [IU] via SUBCUTANEOUS

## 2017-02-19 MED ORDER — POLYETHYLENE GLYCOL 3350 17 G PO PACK
17.0000 g | PACK | Freq: Every day | ORAL | Status: DC | PRN
  Administered 2017-02-20: 17 g via ORAL
  Filled 2017-02-19 (×2): qty 1

## 2017-02-19 MED ORDER — CLOPIDOGREL BISULFATE 75 MG PO TABS
75.0000 mg | ORAL_TABLET | Freq: Every day | ORAL | Status: DC
  Administered 2017-02-20 – 2017-02-27 (×8): 75 mg via ORAL
  Filled 2017-02-19 (×8): qty 1

## 2017-02-19 MED ORDER — SENNOSIDES-DOCUSATE SODIUM 8.6-50 MG PO TABS
2.0000 | ORAL_TABLET | Freq: Every evening | ORAL | Status: DC | PRN
  Administered 2017-02-22 – 2017-02-25 (×2): 2 via ORAL
  Filled 2017-02-19 (×2): qty 2

## 2017-02-19 MED ORDER — INSULIN GLARGINE 100 UNIT/ML ~~LOC~~ SOLN
10.0000 [IU] | Freq: Every day | SUBCUTANEOUS | Status: DC
  Administered 2017-02-20: 10 [IU] via SUBCUTANEOUS
  Filled 2017-02-19 (×2): qty 0.1

## 2017-02-19 MED ORDER — HYDROCHLOROTHIAZIDE 25 MG PO TABS
25.0000 mg | ORAL_TABLET | Freq: Every day | ORAL | Status: DC
  Administered 2017-02-19: 25 mg via ORAL
  Filled 2017-02-19: qty 1

## 2017-02-19 MED ORDER — ACETAMINOPHEN 325 MG PO TABS
325.0000 mg | ORAL_TABLET | ORAL | Status: DC | PRN
  Administered 2017-02-19: 650 mg via ORAL
  Filled 2017-02-19: qty 2

## 2017-02-19 MED ORDER — LISINOPRIL 10 MG PO TABS
10.0000 mg | ORAL_TABLET | Freq: Every day | ORAL | Status: DC
  Administered 2017-02-19: 10 mg via ORAL
  Filled 2017-02-19: qty 1

## 2017-02-19 MED ORDER — FLEET ENEMA 7-19 GM/118ML RE ENEM: 1.0000 | ENEMA | Freq: Once | RECTAL | Status: DC | PRN

## 2017-02-19 MED ORDER — GUAIFENESIN-DM 100-10 MG/5ML PO SYRP: 5.0000 mL | ORAL_SOLUTION | Freq: Four times a day (QID) | ORAL | Status: DC | PRN

## 2017-02-19 MED ORDER — PROCHLORPERAZINE 25 MG RE SUPP: 12.5000 mg | Freq: Four times a day (QID) | RECTAL | Status: DC | PRN

## 2017-02-19 MED ORDER — TRAZODONE HCL 50 MG PO TABS
25.0000 mg | ORAL_TABLET | Freq: Every evening | ORAL | Status: DC | PRN
  Administered 2017-02-19: 50 mg via ORAL
  Filled 2017-02-19 (×2): qty 1

## 2017-02-19 MED ORDER — CLOPIDOGREL BISULFATE 75 MG PO TABS: 75.0000 mg | ORAL_TABLET | Freq: Every day | ORAL | 0 refills | Status: DC

## 2017-02-19 MED ORDER — ATORVASTATIN CALCIUM 80 MG PO TABS: 80.0000 mg | ORAL_TABLET | Freq: Every day | ORAL | 2 refills | Status: DC

## 2017-02-19 NOTE — H&P (Signed)
Physical Medicine and Rehabilitation Admission H&P    Chief Complaint  Patient presents with  . Right sided numbness, unsteady gait and dysarthria with word finding difficulty    HPI: Brady Church is a 56 y.o. male with a history of T2DM, limited vision, CHF, polysubstance abuse;who presented on 02/16/17 with slurred speech, word finding deficits, right sided sensory changes,  and staggering gait. MRI revealed an acute ischemic non-hemorrhagic posterior left MCA infarct. 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 2 D echo done revealing Ef 65-70% with moderate LVH and grade 1 diastolic dysfunction. UDS positive for cocaine.  Cardiology consulted for input on elevated troponin and felt that elevation with non-diagnostic pattern and patient with chronic systolic HF--euvolemic. No further work up needed. BLE dopplers without DVT.  Dr. Erlinda Hong recommends ASA and plavix for 3 months followed by Plavix alone. He was seen by PT, OT and ST revealing moderate to sever dysparthria with difficulty following multi-step commands, sensory deficits RUE affecting ability to carry out ADL tasks and substantial functional deficits. PM&R was recommended for rehab needs.    Review of Systems  HENT: Negative for hearing loss and tinnitus.   Eyes: Negative for pain.       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   Respiratory: Negative for cough and shortness of breath.   Cardiovascular: Negative for chest pain and palpitations.  Gastrointestinal: Negative for abdominal pain, constipation and heartburn.  Genitourinary: Negative for frequency and urgency.  Musculoskeletal: Negative for back pain, myalgias and neck pain.  Skin: Negative for itching and rash.  Neurological: Positive for sensory change and speech change. Negative for dizziness and headaches.  Psychiatric/Behavioral: Negative for depression. The patient is not nervous/anxious  and does not have insomnia.       Past Medical History:  Diagnosis Date  . 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 Diabetes mellitus   . High cholesterol   . Hypertension   . Substance abuse    cocaine. Stopped in 2010  . Tobacco use disorder 10/07/2014    Past Surgical History:  Procedure Laterality Date  . CATARACT EXTRACTION  01/08/2012   Right eye  . EYE SURGERY     x 4 total     Family History  Problem Relation Age of Onset  . Coronary artery disease Mother 34    died of MI  . Cancer Father 56    Some GI cancer. Not sure if it was Colon Cancer or not.  . Hypertension Father   . Diabetes Maternal Grandmother     Social History:  Lives with girlfriend. Independent with white cane PTA.  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 He  reports that he has been smoking Cigarettes.  He has been smoking about 8 cigarettes per day.  He has never used smokeless tobacco. He reports that he drinks alcohol--40 ounces of beer daily and couple of shots of liquor on the weekend.   He reports that he uses drugs, including Cocaine and THC occasionally.     Allergies: No Known Allergies    Medications Prior to Admission  Medication Sig Dispense Refill  . carvedilol (COREG) 6.25 MG tablet Take 6.25 mg by mouth 2 (two) times daily with a meal.    . dorzolamide-timolol (COSOPT) 22.3-6.8 MG/ML ophthalmic solution Place 1 drop into the right  eye 2 (two) times daily. 10 mL 3  . EQ ASPIRIN ADULT LOW DOSE 81 MG EC tablet TAKE ONE TABLET BY MOUTH ONCE DAILY 30 tablet 11  . hydrochlorothiazide (HYDRODIURIL) 25 MG tablet Take 1 tablet (25 mg total) by mouth daily. 30 tablet 11  . lisinopril (PRINIVIL,ZESTRIL) 40 MG tablet Take 1 tablet (40 mg total) by mouth daily. 30 tablet 11  . metFORMIN (GLUCOPHAGE) 1000 MG tablet Take 1 tablet (1,000 mg total) by mouth 2 (two) times daily with a meal. 60 tablet 11  . ACCU-CHEK FASTCLIX LANCETS MISC 1 each by Does not  apply route 2 (two) times daily. 102 each 12  . glucose blood (ACCU-CHEK SMARTVIEW) test strip Use as instructed 100 each 12  . lovastatin (MEVACOR) 20 MG tablet Take 2 tablets (40 mg total) by mouth at bedtime. (Patient not taking: Reported on 02/16/2017) 60 tablet 11  . Omeprazole 20 MG TBEC Take 1 tablet (20 mg total) by mouth daily. (Patient not taking: Reported on 02/16/2017) 30 each 11    Home: Home Living Family/patient expects to be discharged to:: Private residence Living Arrangements: Alone Available Help at Discharge: Friend(s) Type of Home: Apartment  Lives With: Alone   Functional History: Prior Function Level of Independence: Independent with assistive device(s) Comments: Pt uses a cane to navigate environment due to blindness  Functional Status:  Mobility: Bed Mobility Overal bed mobility: Needs Assistance Bed Mobility: Supine to Sit Supine to sit: Modified independent (Device/Increase time) General bed mobility comments: use of bed rails to and vc for sequencing Transfers Overall transfer level: Needs assistance Equipment used: Straight cane (white cane) Transfers: Sit to/from Stand Sit to Stand: Supervision General transfer comment: for safety with cane in R hand initially Ambulation/Gait Ambulation/Gait assistance: Min guard Ambulation Distance (Feet): 115 Feet (with standing breaks) Assistive device: Straight cane (white cane) Gait Pattern/deviations: Step-through pattern General Gait Details: assist for safety, stability due to limited vision, kept cane in L hand and using appropriately Gait velocity: decreased Stairs: Yes Stairs assistance: Min assist, Mod assist Stair Management: One rail Left, Alternating pattern, Forwards Number of Stairs: 10 General stair comments: assist for safety due to dropping cane out of R hand on descent and increased assist for safety during that moment    ADL: ADL Overall ADL's : Needs  assistance/impaired Eating/Feeding: Moderate assistance Grooming: Wash/dry face, Set up, Sitting Upper Body Bathing: Minimal assistance Lower Body Bathing: Moderate assistance, Cueing for safety Upper Body Dressing : Moderate assistance Lower Body Dressing: Maximal assistance Toilet Transfer: Ambulation, Comfort height toilet, Cueing for safety, Moderate assistance (hand held assist) Toilet Transfer Details (indicate cue type and reason): simulated Toileting- Clothing Manipulation and Hygiene: Moderate assistance Tub/ Shower Transfer: Moderate assistance Functional mobility during ADLs: Minimal assistance (hand held assist)  Cognition: Cognition Overall Cognitive Status: Impaired/Different from baseline Arousal/Alertness: Awake/alert Orientation Level: Oriented to person, Oriented to place, Oriented to situation, Disoriented to person Attention: Selective Selective Attention: Appears intact Memory: Appears intact Awareness: Appears intact Safety/Judgment: Appears intact Cognition Arousal/Alertness: Awake/alert Behavior During Therapy: WFL for tasks assessed/performed Overall Cognitive Status: Impaired/Different from baseline Area of Impairment: Safety/judgement, Awareness Safety/Judgement: Decreased awareness of deficits, Decreased awareness of safety Awareness: Emergent General Comments: able to see results of impairments this session dropping cane down stairs due to R UE numbness   Blood pressure (!) 154/95, pulse 84, temperature 98.2 F (36.8 C), temperature source Oral, resp. rate 20, height '5\' 8"'$  (1.727 m), weight 97.7 kg (215 lb 4.8 oz), SpO2 96 %.  Physical Exam  Nursing note and vitals reviewed. Constitutional: He appears well-developed and well-nourished.  HENT:  Head: Normocephalic and atraumatic.  Eyes: Conjunctivae are normal.  Left pupil opaque and non-reactive. Has difficulty moving eyes to right field   Neck: Normal range of motion. Neck supple.   Cardiovascular: Normal rate and regular rhythm.   Respiratory: No stridor.  GI: Soft. Bowel sounds are normal. He exhibits no distension. There is no tenderness.  Neurological: He is alert.  Flat and dysarthric speech with expressive deficits. Limited vision--appears to have better vision centrally. Gaze dysconjugate. Has difficulties tracking to right.  Oriented to self and place but thought that he was at [facility].  Able is able to follow basic commands. Mild right hemiparesis 4/5 prox to distal upper and lower extremity. 4+ to 5/5 LUE and LLE. Senses pain and light touch in al 4 limbs.   Skin: Skin is warm and dry.  Psychiatric: He has a normal mood and affect. His behavior is normal. Thought content normal.    Results for orders placed or performed during the hospital encounter of 02/16/17 (from the past 48 hour(s))  Glucose, capillary     Status: Abnormal   Collection Time: 02/17/17  4:13 PM  Result Value Ref Range   Glucose-Capillary 222 (H) 65 - 99 mg/dL  Glucose, capillary     Status: Abnormal   Collection Time: 02/17/17  8:19 PM  Result Value Ref Range   Glucose-Capillary 322 (H) 65 - 99 mg/dL   Comment 1 Notify RN    Comment 2 Document in Chart   Basic metabolic panel     Status: Abnormal   Collection Time: 02/18/17  4:25 AM  Result Value Ref Range   Sodium 133 (L) 135 - 145 mmol/L   Potassium 3.3 (L) 3.5 - 5.1 mmol/L   Chloride 97 (L) 101 - 111 mmol/L   CO2 24 22 - 32 mmol/L   Glucose, Bld 290 (H) 65 - 99 mg/dL   BUN 9 6 - 20 mg/dL   Creatinine, Ser 0.88 0.61 - 1.24 mg/dL   Calcium 8.9 8.9 - 10.3 mg/dL   GFR calc non Af Amer >60 >60 mL/min   GFR calc Af Amer >60 >60 mL/min    Comment: (NOTE) The eGFR has been calculated using the CKD EPI equation. This calculation has not been validated in all clinical situations. eGFR's persistently <60 mL/min signify possible Chronic Kidney Disease.    Anion gap 12 5 - 15  Glucose, capillary     Status: Abnormal   Collection Time:  02/18/17  6:33 AM  Result Value Ref Range   Glucose-Capillary 287 (H) 65 - 99 mg/dL   Comment 1 Notify RN    Comment 2 Document in Chart   Glucose, capillary     Status: Abnormal   Collection Time: 02/18/17 12:21 PM  Result Value Ref Range   Glucose-Capillary 370 (H) 65 - 99 mg/dL  Glucose, capillary     Status: Abnormal   Collection Time: 02/18/17  4:02 PM  Result Value Ref Range   Glucose-Capillary 286 (H) 65 - 99 mg/dL  Glucose, capillary     Status: Abnormal   Collection Time: 02/18/17 10:07 PM  Result Value Ref Range   Glucose-Capillary 309 (H) 65 - 99 mg/dL   Comment 1 Notify RN    Comment 2 Document in Chart   Glucose, capillary     Status: Abnormal   Collection Time: 02/19/17  6:08 AM  Result Value Ref  Range   Glucose-Capillary 255 (H) 65 - 99 mg/dL   Comment 1 Notify RN    Comment 2 Document in Chart   Glucose, capillary     Status: Abnormal   Collection Time: 02/19/17 11:08 AM  Result Value Ref Range   Glucose-Capillary 228 (H) 65 - 99 mg/dL   No results found.     Medical Problem List and Plan: 1.  Functional and language defiits  secondary to left MCA infarct  -admit to inpatient rehab 2.  DVT Prophylaxis/Anticoagulation: Pharmaceutical: Lovenox 3. Pain Management: Tylenol prn HA 4. Mood: LCSW to follow for evaluation and support.  5. Neuropsych: This patient is capable of making decisions on his own behalf. 6. Skin/Wound Care: Routine pressure relief measures. Maintain adequate nutritional status 7. Fluids/Electrolytes/Nutrition: Monitor I/O. Check lytes in am.  8. T2DM: Poorly controlled--had stopped taking metformin weeks ago.   Hgb A1C- 14.4. Was on metformin at home and now on lantus--start education on insulin pen use. Will monitor BS ac/hs. Continue lantus daily with SSI for elevated BS. 9. HTN: Monitor BP bid with permissive HTN for 5-7 days. On HCTZ and prinivil--avoid hypotension. 10. Glaucoma: On alphagan and difluperdnate,  11. Hyponatremia:  Question chronic due to diuretic. Recheck in am.   12. Dyslipidemia: Now on lipitor.     Post Admission Physician Evaluation: 1. Functional deficits secondary  to left MCA infarct. 2. Patient is admitted to receive collaborative, interdisciplinary care between the physiatrist, rehab nursing staff, and therapy team. 3. Patient's level of medical complexity and substantial therapy needs in context of that medical necessity cannot be provided at a lesser intensity of care such as a SNF. 4. Patient has experienced substantial functional loss from his/her baseline which was documented above under the "Functional History" and "Functional Status" headings.  Judging by the patient's diagnosis, physical exam, and functional history, the patient has potential for functional progress which will result in measurable gains while on inpatient rehab.  These gains will be of substantial and practical use upon discharge  in facilitating mobility and self-care at the household level. 5. Physiatrist will provide 24 hour management of medical needs as well as oversight of the therapy plan/treatment and provide guidance as appropriate regarding the interaction of the two. 6. The Preadmission Screening has been reviewed and patient status is unchanged unless otherwise stated above. 7. 24 hour rehab nursing will assist with bladder management, bowel management, safety, skin/wound care, disease management, medication administration, pain management and patient education  and help integrate therapy concepts, techniques,education, etc. 8. PT will assess and treat for/with: Lower extremity strength, range of motion, stamina, balance, functional mobility, safety, adaptive techniques and equipment, NMR, visual-perceptual awareness, family/caregiver education .   Goals are: mod I to supervision. 9. OT will assess and treat for/with: ADL's, functional mobility, safety, upper extremity strength, adaptive techniques and equipment,  NMR, visual-spatial awareness, pt and caregiver ed.   Goals are: mod I to supervision. Therapy may proceed with showering this patient. 10. SLP will assess and treat for/with: cognition, language, education.  Goals are: mod I to supervision. 11. Case Management and Social Worker will assess and treat for psychological issues and discharge planning. 12. Team conference will be held weekly to assess progress toward goals and to determine barriers to discharge. 13. Patient will receive at least 3 hours of therapy per day at least 5 days per week. 14. ELOS: 7-8 days       15. Prognosis:  excellent     Alroy Dust  Alen Blew, MD, [facility] 02/19/2017  Bary Leriche, Hershal Coria 02/19/2017

## 2017-02-19 NOTE — Progress Notes (Signed)
Noted yelling from patient's room. Entered room and noted patient yelling and cussing out the NT. Dropping the F bomb multiple times. Per NT patient upset that we do not have the creamer he wants. Pt. Asked for milk and the NT agreed to give him that but pt. Continues to cuss out staff. This Clinical research associate asked pt. To calm down and to quit using that language with staff. Pt. Eventually calmed down and allowed staff to put the bed alarm back on.

## 2017-02-19 NOTE — Progress Notes (Signed)
Rehab admissions - I met with patient and his ex-girlfriend, Lila Evans.  Patient does not want me to contact any of his brothers or sisters.  His parents are deceased and he has no children.  He wants me to talk to Lila.  Lila plans to take patient home after rehab stay.  I gave them rehab booklets and explained rehab.  Bed available and will be admitted to acute inpatient rehab today.  I called resident and have clearance for rehab admit for today.  Call me for questions.  #[phone] 

## 2017-02-19 NOTE — Progress Notes (Signed)
Occupational Therapy Treatment Patient Details Name: Brady Church MRN: [id number] DOB: 02/05/1961 Today's Date: 02/19/2017    History of present illness Pt is a 56 y.o. male with PMH of T2DM, HTN, HLD, CHF (LVEF 40-45%, G1DD, 12/2014), blindness, tobacco use, cocaine abuse who presented with altered mental status on 3/23. Pt was noted to ahve a staggering gait and slurred speech, along with difficulty "thinking of words and also forming them with his mouth. He denies difficulties with comprehension". Also noted R arm numb. 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   OT comments  Pt progressing toward OT goals. He continues to demonstrate decreased safety awareness but was aware of deficits during functional tasks this session. He was able to complete LB dressing tasks with mod assist and continues to demonstrate difficulty with fine motor coordination as related to ADL tasks. Facilitated improved fine motor skills during ADL while pt opening/closing a variety of self-care containers. D/C plan remains appropriate and pt is looking forward to continued rehabilitation. Will continue to follow acutely.   Follow Up Recommendations  CIR    Equipment Recommendations  Other (comment) (defer to next venue of care)    Recommendations for Other Services Rehab consult    Precautions / Restrictions Precautions Precautions: Fall Restrictions Weight Bearing Restrictions: No       Mobility Bed Mobility Overal bed mobility: Needs Assistance Bed Mobility: Supine to Sit     Supine to sit: Modified independent (Device/Increase time)        Transfers Overall transfer level: Needs assistance Equipment used: Straight cane (white cane) Transfers: Sit to/from Stand Sit to Stand: Supervision         General transfer comment: for safety    Balance Overall balance assessment: Needs assistance Sitting-balance  support: Feet supported Sitting balance-Leahy Scale: Good Sitting balance - Comments: sitting EOB with no back support   Standing balance support: No upper extremity supported Standing balance-Leahy Scale: Fair Standing balance comment: initial standing holding cane in air, no LOB, but some wide stance and seems unable to take challenge                 Standardized Balance Assessment Standardized Balance Assessment : Dynamic Gait Index   Dynamic Gait Index Level Surface: Mild Impairment Change in Gait Speed: Moderate Impairment Gait with Horizontal Head Turns: Moderate Impairment Gait with Vertical Head Turns: Mild Impairment Gait and Pivot Turn: Mild Impairment Step Over Obstacle: Mild Impairment Step Around Obstacles: Mild Impairment Steps: Mild Impairment Total Score: 14     ADL either performed or assessed with clinical judgement   ADL Overall ADL's : Needs assistance/impaired     Grooming: Minimal assistance;Oral care;Standing Grooming Details (indicate cue type and reason): Min assist to open containers.             Lower Body Dressing: Moderate assistance;Sit to/from stand Lower Body Dressing Details (indicate cue type and reason): Mod assist to pull pants over hips and to fasten. Toilet Transfer: Ambulation;Min guard;Cueing for safety;Cueing for sequencing;Regular Toilet           Functional mobility during ADLs: Min guard General ADL Comments: Pt with decreased attention to tasks and became easily frustrated by decreased fine motor coordination while attempting to fasten his belt. Decreased awareness of his R hand coordination and sensation deficits during conversation but acknowledges deficits when performing tasks.      Vision   Additional Comments: Pt able to see basic outline  of objects at baseline but no details. He reports no changes from baseline.    Perception     Praxis      Cognition Arousal/Alertness: Awake/alert Behavior During  Therapy: WFL for tasks assessed/performed Overall Cognitive Status: Impaired/Different from baseline Area of Impairment: Safety/judgement;Awareness                         Safety/Judgement: Decreased awareness of deficits;Decreased awareness of safety Awareness: Emergent   General Comments: Expressive difficulties impacting ability to truly assess cognition.         Exercises Other Exercises Other Exercises: Facilitated improved fine motor coordination with opening/closing a variety of self-care item containers.   Shoulder Instructions       General Comments using cane for vision, able to step over and around obstacles with min verbal cues    Pertinent Vitals/ Pain       Pain Assessment: No/denies pain  Home Living       Type of Home: Apartment (apartment is on the 3rd level.) Home Access: Stairs to enter [employer] of Steps: 17 steps to 3rd level apartment   Home Layout: One level                          Prior Functioning/Environment              Frequency  Min 3X/week        Progress Toward Goals  OT Goals(current goals can now be found in the care plan section)  Progress towards OT goals: Progressing toward goals  Acute Rehab OT Goals Patient Stated Goal: to go to rehab OT Goal Formulation: With patient Time For Goal Achievement: 03/06/17 Potential to Achieve Goals: Good ADL Goals Pt Will Perform Grooming: with modified independence;standing Pt Will Perform Upper Body Bathing: with modified independence;sitting Pt Will Perform Lower Body Bathing: with modified independence;with adaptive equipment;sit to/from stand Pt Will Transfer to Toilet: with modified independence;ambulating;regular height toilet;grab bars Pt Will Perform Toileting - Clothing Manipulation and hygiene: with modified independence;sit to/from stand Additional ADL Goal #1: Pt will recall 2 safety measures he needs to take due to decreased sensation in  R hand with no verbal cues  Plan Discharge plan remains appropriate    Co-evaluation                 End of Session Equipment Utilized During Treatment: Gait belt  OT Visit Diagnosis: Unsteadiness on feet (R26.81);Other abnormalities of gait and mobility (R26.89);Low vision, both eyes (H54.2);Other symptoms and signs involving the nervous system (R29.898);Hemiplegia and hemiparesis Hemiplegia - Right/Left: Right Hemiplegia - dominant/non-dominant: Dominant Hemiplegia - caused by: Cerebral infarction   Activity Tolerance Patient tolerated treatment well   Patient Left in bed;with call bell/phone within reach;with bed alarm set   Nurse Communication          Time: 1610-9604 OT Time Calculation (min): 14 min  Charges: OT General Charges $OT Visit: 1 Procedure OT Treatments $Self Care/Home Management : 8-22 mins  Doristine Section, MS OTR/L  Pager: [phone]    Kendel Pesnell A Lewie Deman 02/19/2017, 2:38 PM

## 2017-02-19 NOTE — H&P (Signed)
Physical Medicine and Rehabilitation Admission H&P       Chief Complaint  Patient presents with  . Right sided numbness, unsteady gait and dysarthria with word finding difficulty    HPI: Brady Church a 56 y.o.malewith a history of T2DM, limited vision, CHF, polysubstance abuse;who presented on 02/16/17 with slurred speech, word finding deficits, right sided sensory changes, and staggering gait. MRI revealed an acute ischemic non-hemorrhagic posterior left MCA infarct. 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 2 D echo done revealing Ef 65-70% with moderate LVH and grade 1 diastolic dysfunction. UDS positive for cocaine.  Cardiology consulted for input on elevated troponin and felt that elevation with non-diagnostic pattern and patient with chronic systolic HF--euvolemic. No further work up needed. BLE dopplers without DVT.  Dr. Erlinda Hong recommends ASA and plavix for 3 months followed by Plavix alone. He was seen by PT, OT and ST revealing moderate to sever dysparthria with difficulty following multi-step commands, sensory deficits RUE affecting ability to carry out ADL tasks and substantial functional deficits. PM&R was recommended for rehab needs.    Review of Systems  HENT: Negative for hearing loss and tinnitus.   Eyes: Negative for pain.       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   Respiratory: Negative for cough and shortness of breath.   Cardiovascular: Negative for chest pain and palpitations.  Gastrointestinal: Negative for abdominal pain, constipation and heartburn.  Genitourinary: Negative for frequency and urgency.  Musculoskeletal: Negative for back pain, myalgias and neck pain.  Skin: Negative for itching and rash.  Neurological: Positive for sensory change and speech change. Negative for dizziness and headaches.  Psychiatric/Behavioral: Negative for depression. The patient is not  nervous/anxious and does not have insomnia.           Past Medical History:  Diagnosis Date  . 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 Diabetes mellitus   . High cholesterol   . Hypertension   . Substance abuse    cocaine. Stopped in 2010  . Tobacco use disorder 10/07/2014         Past Surgical History:  Procedure Laterality Date  . CATARACT EXTRACTION  01/08/2012   Right eye  . EYE SURGERY     x 4 total           Family History  Problem Relation Age of Onset  . Coronary artery disease Mother 63    died of MI  . Cancer Father 22    Some GI cancer. Not sure if it was Colon Cancer or not.  . Hypertension Father   . Diabetes Maternal Grandmother     Social History:  Lives with girlfriend. Independent with white cane PTA.  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 He  reports that he has been smoking Cigarettes.  He has been smoking about 8 cigarettes per day.  He has never used smokeless tobacco. He reports that he drinks alcohol--40 ounces of beer daily and couple of shots of liquor on the weekend.   He reports that he uses drugs, including Cocaine and THC occasionally.     Allergies: No Known Allergies          Medications Prior to Admission  Medication Sig Dispense Refill  . carvedilol (COREG) 6.25 MG tablet Take 6.25 mg by mouth 2 (two) times  daily with a meal.    . dorzolamide-timolol (COSOPT) 22.3-6.8 MG/ML ophthalmic solution Place 1 drop into the right eye 2 (two) times daily. 10 mL 3  . EQ ASPIRIN ADULT LOW DOSE 81 MG EC tablet TAKE ONE TABLET BY MOUTH ONCE DAILY 30 tablet 11  . hydrochlorothiazide (HYDRODIURIL) 25 MG tablet Take 1 tablet (25 mg total) by mouth daily. 30 tablet 11  . lisinopril (PRINIVIL,ZESTRIL) 40 MG tablet Take 1 tablet (40 mg total) by mouth daily. 30 tablet 11  . metFORMIN (GLUCOPHAGE) 1000 MG tablet Take 1 tablet (1,000 mg total) by mouth 2 (two) times daily with a  meal. 60 tablet 11  . ACCU-CHEK FASTCLIX LANCETS MISC 1 each by Does not apply route 2 (two) times daily. 102 each 12  . glucose blood (ACCU-CHEK SMARTVIEW) test strip Use as instructed 100 each 12  . lovastatin (MEVACOR) 20 MG tablet Take 2 tablets (40 mg total) by mouth at bedtime. (Patient not taking: Reported on 02/16/2017) 60 tablet 11  . Omeprazole 20 MG TBEC Take 1 tablet (20 mg total) by mouth daily. (Patient not taking: Reported on 02/16/2017) 30 each 11    Home: Home Living Family/patient expects to be discharged to:: Private residence Living Arrangements: Alone Available Help at Discharge: Friend(s) Type of Home: Apartment  Lives With: Alone   Functional History: Prior Function Level of Independence: Independent with assistive device(s) Comments: Pt uses a cane to navigate environment due to blindness  Functional Status:  Mobility: Bed Mobility Overal bed mobility: Needs Assistance Bed Mobility: Supine to Sit Supine to sit: Modified independent (Device/Increase time) General bed mobility comments: use of bed rails to and vc for sequencing Transfers Overall transfer level: Needs assistance Equipment used: Straight cane (white cane) Transfers: Sit to/from Stand Sit to Stand: Supervision General transfer comment: for safety with cane in R hand initially Ambulation/Gait Ambulation/Gait assistance: Min guard Ambulation Distance (Feet): 115 Feet (with standing breaks) Assistive device: Straight cane (white cane) Gait Pattern/deviations: Step-through pattern General Gait Details: assist for safety, stability due to limited vision, kept cane in L hand and using appropriately Gait velocity: decreased Stairs: Yes Stairs assistance: Min assist, Mod assist Stair Management: One rail Left, Alternating pattern, Forwards Number of Stairs: 10 General stair comments: assist for safety due to dropping cane out of R hand on descent and increased assist for safety during that  moment  ADL: ADL Overall ADL's : Needs assistance/impaired Eating/Feeding: Moderate assistance Grooming: Wash/dry face, Set up, Sitting Upper Body Bathing: Minimal assistance Lower Body Bathing: Moderate assistance, Cueing for safety Upper Body Dressing : Moderate assistance Lower Body Dressing: Maximal assistance Toilet Transfer: Ambulation, Comfort height toilet, Cueing for safety, Moderate assistance (hand held assist) Toilet Transfer Details (indicate cue type and reason): simulated Toileting- Clothing Manipulation and Hygiene: Moderate assistance Tub/ Shower Transfer: Moderate assistance Functional mobility during ADLs: Minimal assistance (hand held assist)  Cognition: Cognition Overall Cognitive Status: Impaired/Different from baseline Arousal/Alertness: Awake/alert Orientation Level: Oriented to person, Oriented to place, Oriented to situation, Disoriented to person Attention: Selective Selective Attention: Appears intact Memory: Appears intact Awareness: Appears intact Safety/Judgment: Appears intact Cognition Arousal/Alertness: Awake/alert Behavior During Therapy: WFL for tasks assessed/performed Overall Cognitive Status: Impaired/Different from baseline Area of Impairment: Safety/judgement, Awareness Safety/Judgement: Decreased awareness of deficits, Decreased awareness of safety Awareness: Emergent General Comments: able to see results of impairments this session dropping cane down stairs due to R UE numbness   Blood pressure (!) 154/95, pulse 84, temperature 98.2 F (36.8 C), temperature source  Oral, resp. rate 20, height 5' 8"  (1.727 m), weight 97.7 kg (215 lb 4.8 oz), SpO2 96 %. Physical Exam  Nursing note and vitals reviewed. Constitutional: He appears well-developed and well-nourished.  HENT:  Head: Normocephalic and atraumatic.  Eyes: Conjunctivae are normal.  Left pupil opaque and non-reactive. Has difficulty moving eyes to right field   Neck:  Normal range of motion. Neck supple.  Cardiovascular: Normal rate and regular rhythm.   Respiratory: No stridor.  GI: Soft. Bowel sounds are normal. He exhibits no distension. There is no tenderness.  Neurological: He is alert.  Flat and dysarthric speech with expressive deficits. Limited vision--appears to have better vision centrally. Gaze dysconjugate. Has difficulties tracking to right.  Oriented to self and place but thought that he was at [facility].  Able is able to follow basic commands. Mild right hemiparesis 4/5 prox to distal upper and lower extremity. 4+ to 5/5 LUE and LLE. Senses pain and light touch in al 4 limbs.   Skin: Skin is warm and dry.  Psychiatric: He has a normal mood and affect. His behavior is normal. Thought content normal.    Lab Results Last 48 Hours        Results for orders placed or performed during the hospital encounter of 02/16/17 (from the past 48 hour(s))  Glucose, capillary     Status: Abnormal   Collection Time: 02/17/17  4:13 PM  Result Value Ref Range   Glucose-Capillary 222 (H) 65 - 99 mg/dL  Glucose, capillary     Status: Abnormal   Collection Time: 02/17/17  8:19 PM  Result Value Ref Range   Glucose-Capillary 322 (H) 65 - 99 mg/dL   Comment 1 Notify RN    Comment 2 Document in Chart   Basic metabolic panel     Status: Abnormal   Collection Time: 02/18/17  4:25 AM  Result Value Ref Range   Sodium 133 (L) 135 - 145 mmol/L   Potassium 3.3 (L) 3.5 - 5.1 mmol/L   Chloride 97 (L) 101 - 111 mmol/L   CO2 24 22 - 32 mmol/L   Glucose, Bld 290 (H) 65 - 99 mg/dL   BUN 9 6 - 20 mg/dL   Creatinine, Ser 0.88 0.61 - 1.24 mg/dL   Calcium 8.9 8.9 - 10.3 mg/dL   GFR calc non Af Amer >60 >60 mL/min   GFR calc Af Amer >60 >60 mL/min    Comment: (NOTE) The eGFR has been calculated using the CKD EPI equation. This calculation has not been validated in all clinical situations. eGFR's persistently <60 mL/min signify possible Chronic  Kidney Disease.    Anion gap 12 5 - 15  Glucose, capillary     Status: Abnormal   Collection Time: 02/18/17  6:33 AM  Result Value Ref Range   Glucose-Capillary 287 (H) 65 - 99 mg/dL   Comment 1 Notify RN    Comment 2 Document in Chart   Glucose, capillary     Status: Abnormal   Collection Time: 02/18/17 12:21 PM  Result Value Ref Range   Glucose-Capillary 370 (H) 65 - 99 mg/dL  Glucose, capillary     Status: Abnormal   Collection Time: 02/18/17  4:02 PM  Result Value Ref Range   Glucose-Capillary 286 (H) 65 - 99 mg/dL  Glucose, capillary     Status: Abnormal   Collection Time: 02/18/17 10:07 PM  Result Value Ref Range   Glucose-Capillary 309 (H) 65 - 99 mg/dL   Comment 1 Notify  RN    Comment 2 Document in Chart   Glucose, capillary     Status: Abnormal   Collection Time: 02/19/17  6:08 AM  Result Value Ref Range   Glucose-Capillary 255 (H) 65 - 99 mg/dL   Comment 1 Notify RN    Comment 2 Document in Chart   Glucose, capillary     Status: Abnormal   Collection Time: 02/19/17 11:08 AM  Result Value Ref Range   Glucose-Capillary 228 (H) 65 - 99 mg/dL     Imaging Results (Last 48 hours)  No results found.       Medical Problem List and Plan: 1.  Functional and language defiits  secondary to left MCA infarct             -admit to inpatient rehab 2.  DVT Prophylaxis/Anticoagulation: Pharmaceutical: Lovenox 3. Pain Management: Tylenol prn HA 4. Mood: LCSW to follow for evaluation and support.  5. Neuropsych: This patient is capable of making decisions on his own behalf. 6. Skin/Wound Care: Routine pressure relief measures. Maintain adequate nutritional status 7. Fluids/Electrolytes/Nutrition: Monitor I/O. Check lytes in am.  8. T2DM: Poorly controlled--had stopped taking metformin weeks ago.   Hgb A1C- 14.4. Was on metformin at home and now on lantus--start education on insulin pen use. Will monitor BS ac/hs. Continue lantus daily with SSI  for elevated BS. 9. HTN: Monitor BP bid with permissive HTN for 5-7 days. On HCTZ and prinivil--avoid hypotension. 10. Glaucoma: On alphagan and difluperdnate,  11. Hyponatremia: Question chronic due to diuretic. Recheck in am.   12. Dyslipidemia: Now on lipitor.     Post Admission Physician Evaluation: 1. Functional deficits secondary  to left MCA infarct. 2. Patient is admitted to receive collaborative, interdisciplinary care between the physiatrist, rehab nursing staff, and therapy team. 3. Patient's level of medical complexity and substantial therapy needs in context of that medical necessity cannot be provided at a lesser intensity of care such as a SNF. 4. Patient has experienced substantial functional loss from his/her baseline which was documented above under the "Functional History" and "Functional Status" headings.  Judging by the patient's diagnosis, physical exam, and functional history, the patient has potential for functional progress which will result in measurable gains while on inpatient rehab.  These gains will be of substantial and practical use upon discharge  in facilitating mobility and self-care at the household level. 5. Physiatrist will provide 24 hour management of medical needs as well as oversight of the therapy plan/treatment and provide guidance as appropriate regarding the interaction of the two. 6. The Preadmission Screening has been reviewed and patient status is unchanged unless otherwise stated above. 7. 24 hour rehab nursing will assist with bladder management, bowel management, safety, skin/wound care, disease management, medication administration, pain management and patient education  and help integrate therapy concepts, techniques,education, etc. 8. PT will assess and treat for/with: Lower extremity strength, range of motion, stamina, balance, functional mobility, safety, adaptive techniques and equipment, NMR, visual-perceptual awareness, family/caregiver  education .   Goals are: mod I to supervision. 9. OT will assess and treat for/with: ADL's, functional mobility, safety, upper extremity strength, adaptive techniques and equipment, NMR, visual-spatial awareness, pt and caregiver ed.   Goals are: mod I to supervision. Therapy may proceed with showering this patient. 10. SLP will assess and treat for/with: cognition, language, education.  Goals are: mod I to supervision. 11. Case Management and Social Worker will assess and treat for psychological issues and discharge planning. 12. Team conference  will be held weekly to assess progress toward goals and to determine barriers to discharge. 13. Patient will receive at least 3 hours of therapy per day at least 5 days per week. 14. ELOS: 7-8 days       15. Prognosis:  excellent     Meredith Staggers, MD, [facility] 02/19/2017  Bary Leriche, Hershal Coria 02/19/2017

## 2017-02-19 NOTE — Progress Notes (Signed)
Subjective: Reports some headache overnight that has resolved and that he is having "a bad day." His speech and right hand numbness have improved very slightly but still remain largely impaired. Inquired about his home Alphagan eye drops that we did not order on admission. Per report has been verbally abusive with staff this morning. Discussed with patient that this behavior is unacceptable and will not be tolerated any further and he voiced understanding.  Objective: Vital signs in last 24 hours: Vitals:   02/18/17 1800 02/18/17 2210 02/19/17 0135 02/19/17 0613  BP: (!) 146/78 (!) 150/80 (!) 142/82 (!) 152/92  Pulse: 68 85 75 83  Resp: 18 20 20 20   Temp: 98.9 F (37.2 C) 99 F (37.2 C) 98.7 F (37.1 C) 99.1 F (37.3 C)  TempSrc: Oral Oral Oral Oral  SpO2: 96% 97% 98% 97%  Weight:      Height:       No intake or output data in the 24 hours ending 02/19/17 0945  Physical Exam General appearance: Man resting in bed, no acute distress Eyes: Blind bilaterally, injected sclera bilaterally, left lens opacified, moist mucous membranes Cardiovascular: Regular rate and rhythm, no murmurs, rubs, gallops Respiratory: Clear to auscultation anteriorly, normal work of breathing Abdomen: Soft, non-tender, mildly distended Extremities: Normal bulk and range of motion, no edema, 2+ peripheral pulses Skin: Warm, dry, intact Neuro: Alert and oriented, blind bilaterally, dysarthric speech, right hand and arm numb  Labs / Imaging / Procedures: CBC Latest Ref Rng & Units 02/16/2017 02/16/2017 07/05/2016  WBC 4.0 - 10.5 K/uL - 6.5 5.8  Hemoglobin 13.0 - 17.0 g/dL 58.8 50.2 -  Hematocrit 39.0 - 52.0 % 41.0 39.1 40.1  Platelets 150 - 400 K/uL - 254 267   BMP Latest Ref Rng & Units 02/18/2017 02/16/2017 02/16/2017  Glucose 65 - 99 mg/dL 774(J) 287(OM) 767(M)  BUN 6 - 20 mg/dL 9 15 13   Creatinine 0.61 - 1.24 mg/dL 0.94 7.09 6.28  BUN/Creat Ratio 9 - 20 - - -  Sodium 135 - 145 mmol/L 133(L) 134(L)  132(L)  Potassium 3.5 - 5.1 mmol/L 3.3(L) 3.7 3.8  Chloride 101 - 111 mmol/L 97(L) 96(L) 96(L)  CO2 22 - 32 mmol/L 24 - 24  Calcium 8.9 - 10.3 mg/dL 8.9 - 8.8(L)    Assessment/Plan: Mr. Rimer is a 56 yo man with PMH DM, HTN, HLD, CHF, tobacco and cocaine use admitted for acute stroke.   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 His expressive aphasia and RUE numbness seem to be improving. Plan is transfer to inpatient rehab.  -- Appreciate neurology recs -- End permissive HTN  -- Asa 325 mg QD and Plavix 75 mg QD for 3 months, then Plavix alone -- Lipitor 80 mg QD -- Planned to CIR as soon as available -- Encourage tobacco, cocaine, alcohol cessation  Hypertension, 150s/90s overnight, outside permissive hypertension window, but should avoid hypotension in setting of significant cerebrovascular disease burden -- Resume home HCTZ 25 mg daily -- Resume home lisinopril at 1/4th dose (10 mg daily)  Elevated troponins, 0.21 on admission with new  diffuse T wave inversion on EKG, cocaine positive UDS, no chest pain, dyspnea, palpitations. Patient seen by cardiology, CP not thought to be 2/2 ACS. Echo with normal systolic function and no regional wall motion abnormalities.  -- Appreciate Cardiology recs -- Telemetry  Prolonged QT, new on serial EKG this admission, 500+ms, K and Mg are within normal limits, not receiving high doses of QT prolonging meds -- Avoid medications that can prolong QT  Poorly controlled DM2, presented with CBG 500, and running 200s to 300s+  during hospital stay despite maximal sliding scale. Rx'd Metformin at home but not taking this for 2-3 weeks, family brought in very unhealthy foods over the weekend. HbA1c was 14.4 on 3/24. Required 49 units of short acting insulin yesterday -- SSI-R and CBGs TID AC HS -- Start Lantus 10 units today, follow and titrate up as needed.  Hypokalemia, K 3.3 over weekend - Oral potassium supplementation as needed - Trend BMP  Blindness, glaucoma  -- Continue Cosopt drops, Difluprednate and added Alphagan eye drops today  Daily alcohol use, reports 40 oz beer daily -- dc CIWA -- Multivitamin, Thiamine, Folate  FEN/GI: HH CM diet, replete electrolytes as needed  DVT ppx: Lovenox  Dispo: Anticipated discharge today.   LOS: 3 days   Althia Forts, MD 02/19/2017, 6:55 AM Pager: [phone]

## 2017-02-19 NOTE — Progress Notes (Signed)
Ranelle Oyster, MD Physician Signed Physical Medicine and Rehabilitation  Consult Note Date of Service: 02/17/2017 6:59 PM  Related encounter: ED to Hosp-Admission (Discharged) from 02/16/2017 in [facility]     Expand All Collapse All   [] Hide copied text [] Hover for attribution information      Physical Medicine and Rehabilitation Consult Reason for Consult:right sided weakness and balance difficulties Referring Physician: Criselda Peaches   HPI: Brady Church is a 56 y.o. male with a history of DM2, CHF, polysubstance abuse, who presented on 02/16/17 with slurred speech, word finding deficits, right sided sensory changes,  and staggering gait. MRI revealed an acute ischemic non-hemorrhagic posterior left MCA infarct. Cardiology following for elevated troponin and feels that the elevation is not an acute syndrome.  Pt was seen by physical and occupational therapy who found the patient to have substantial functional deficits. PM&R was consulted to assess for rehab needs.    Review of Systems  Constitutional: Negative for fever.  HENT: Negative for hearing loss.   Eyes: Positive for blurred vision.  Respiratory: Negative for cough.   Cardiovascular: Negative for chest pain.  Gastrointestinal: Negative for heartburn.  Genitourinary: Negative for dysuria.  Musculoskeletal: Negative for myalgias.  Skin: Negative for rash.  Neurological: Positive for tingling, sensory change, speech change and focal weakness.  Psychiatric/Behavioral: Negative for suicidal ideas.       Past Medical History:  Diagnosis Date  . 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 Diabetes mellitus   . High cholesterol   . Hypertension   . Substance abuse    cocaine. Stopped in 2010  . Tobacco use disorder 10/07/2014        Past Surgical History:  Procedure Laterality Date  . CATARACT EXTRACTION  01/08/2012   Right eye  . EYE  SURGERY     x 4 total    Family History  Problem Relation Age of Onset  . Coronary artery disease Mother 5    died of MI  . Cancer Father 65    Some GI cancer. Not sure if it was Colon Cancer or not.  . Hypertension Father   . Diabetes Maternal Grandmother    Social History:  reports that he has been smoking Cigarettes.  He has been smoking about 0.50 packs per day. He has never used smokeless tobacco. He reports that he drinks alcohol. He reports that he uses drugs, including Cocaine. Allergies: No Known Allergies       Medications Prior to Admission  Medication Sig Dispense Refill  . carvedilol (COREG) 6.25 MG tablet Take 6.25 mg by mouth 2 (two) times daily with a meal.    . dorzolamide-timolol (COSOPT) 22.3-6.8 MG/ML ophthalmic solution Place 1 drop into the right eye 2 (two) times daily. 10 mL 3  . EQ ASPIRIN ADULT LOW DOSE 81 MG EC tablet TAKE ONE TABLET BY MOUTH ONCE DAILY 30 tablet 11  . hydrochlorothiazide (HYDRODIURIL) 25 MG tablet Take 1 tablet (25 mg total) by mouth daily. 30 tablet 11  . lisinopril (PRINIVIL,ZESTRIL) 40 MG tablet Take 1 tablet (40 mg total) by mouth daily. 30 tablet 11  . metFORMIN (GLUCOPHAGE) 1000 MG tablet Take 1 tablet (1,000 mg total) by mouth 2 (two) times daily with a meal. 60 tablet 11  . ACCU-CHEK FASTCLIX LANCETS MISC 1 each by Does not apply route 2 (two) times daily. 102 each 12  . glucose blood (ACCU-CHEK SMARTVIEW)  test strip Use as instructed 100 each 12  . lovastatin (MEVACOR) 20 MG tablet Take 2 tablets (40 mg total) by mouth at bedtime. (Patient not taking: Reported on 02/16/2017) 60 tablet 11  . Omeprazole 20 MG TBEC Take 1 tablet (20 mg total) by mouth daily. (Patient not taking: Reported on 02/16/2017) 30 each 11    Home: Home Living Family/patient expects to be discharged to:: Private residence Living Arrangements: Alone Available Help at Discharge: Friend(s) Type of Home: Apartment  Lives With: Alone  Functional  History: Prior Function Level of Independence: Independent with assistive device(s) Comments: Pt uses a cane to navigate environment due to blindness Functional Status:  Mobility: Bed Mobility Overal bed mobility: Needs Assistance Bed Mobility: Supine to Sit Supine to sit: Min guard, HOB elevated General bed mobility comments: use of bed rails to and vc for sequencing Transfers Overall transfer level: Needs assistance Equipment used: 2 person hand held assist Transfers: Sit to/from Stand Sit to Stand: Min assist General transfer comment: Min assist for stability and guidance, patient with poor awareness of RUE during movement  Ambulation/Gait Ambulation/Gait assistance: Min assist Ambulation Distance (Feet): 12 Feet Assistive device: 2 person hand held assist Gait Pattern/deviations: Step-through pattern General Gait Details: Min assist for stability and guidance, patient with poor awareness of RUE during movement. Some modest instability noted Gait velocity: decreased  ADL: ADL Overall ADL's : Needs assistance/impaired Eating/Feeding: Moderate assistance Grooming: Wash/dry face, Set up, Sitting Upper Body Bathing: Minimal assistance Lower Body Bathing: Moderate assistance, Cueing for safety Upper Body Dressing : Moderate assistance Lower Body Dressing: Maximal assistance Toilet Transfer: Ambulation, Comfort height toilet, Cueing for safety, Moderate assistance (hand held assist) Toilet Transfer Details (indicate cue type and reason): simulated Toileting- Clothing Manipulation and Hygiene: Moderate assistance Tub/ Shower Transfer: Moderate assistance Functional mobility during ADLs: Minimal assistance (hand held assist)  Cognition: Cognition Overall Cognitive Status: Impaired/Different from baseline Arousal/Alertness: Awake/alert Orientation Level: Oriented to person, Oriented to place, Oriented to time, Disoriented to situation Attention: Selective Selective  Attention: Appears intact Memory: Appears intact Awareness: Appears intact Safety/Judgment: Appears intact Cognition Arousal/Alertness: Awake/alert Behavior During Therapy: WFL for tasks assessed/performed (crying after talking to Dr's about new medical status) Overall Cognitive Status: Impaired/Different from baseline Area of Impairment: Safety/judgement, Awareness Safety/Judgement: Decreased awareness of deficits, Decreased awareness of safety Awareness: Emergent General Comments: Expressive deficits impact his ability to communicate what he knows. Does well when given choices.  Blood pressure 109/64, pulse 95, temperature 98.2 F (36.8 C), temperature source Oral, resp. rate 18, height 5\' 8"  (1.727 m), weight 97.7 kg (215 lb 4.8 oz), SpO2 98 %. Physical Exam  Constitutional: He appears well-developed.  HENT:  Head: Atraumatic.  Eyes: Right eye exhibits no discharge. Left eye exhibits no discharge. Scleral icterus is present.  Neck: Neck supple. No thyromegaly present.  Cardiovascular: Normal rate.   Respiratory: Effort normal.  GI: Soft.  Musculoskeletal: Normal range of motion.  Neurological:  Dysconjugate gaze. Able to ID simple objects. Has difficulty tracking to left. Fight facial weakness and dysarthria.  RUE and RLE grossly 4/5. Decreased LT in RUE and RLE. LUE and LLE 5/5 prox to distal. Fair standing balance with walking stick.  Psychiatric: He has a normal mood and affect.    Lab Results Last 24 Hours       Results for orders placed or performed during the hospital encounter of 02/16/17 (from the past 24 hour(s))  CBG monitoring, ED     Status: Abnormal   Collection  Time: 02/16/17  8:23 PM  Result Value Ref Range   Glucose-Capillary 300 (H) 65 - 99 mg/dL   Comment 1 Notify RN   CBG monitoring, ED     Status: Abnormal   Collection Time: 02/16/17 10:17 PM  Result Value Ref Range   Glucose-Capillary 254 (H) 65 - 99 mg/dL   Comment 1 Notify RN   Troponin  I     Status: Abnormal   Collection Time: 02/16/17 11:15 PM  Result Value Ref Range   Troponin I 0.20 (HH) <0.03 ng/mL  Glucose, capillary     Status: Abnormal   Collection Time: 02/17/17 12:32 AM  Result Value Ref Range   Glucose-Capillary 189 (H) 65 - 99 mg/dL   Comment 1 Notify RN    Comment 2 Document in Chart   Glucose, capillary     Status: Abnormal   Collection Time: 02/17/17  4:08 AM  Result Value Ref Range   Glucose-Capillary 162 (H) 65 - 99 mg/dL   Comment 1 Notify RN    Comment 2 Document in Chart   Lipid panel     Status: Abnormal   Collection Time: 02/17/17  4:48 AM  Result Value Ref Range   Cholesterol 225 (H) 0 - 200 mg/dL   Triglycerides 191 (H) <150 mg/dL   HDL 31 (L) >47 mg/dL   Total CHOL/HDL Ratio 7.3 RATIO   VLDL 35 0 - 40 mg/dL   LDL Cholesterol 829 (H) 0 - 99 mg/dL  Troponin I     Status: Abnormal   Collection Time: 02/17/17  4:48 AM  Result Value Ref Range   Troponin I 0.18 (HH) <0.03 ng/mL  Glucose, capillary     Status: Abnormal   Collection Time: 02/17/17  7:28 AM  Result Value Ref Range   Glucose-Capillary 148 (H) 65 - 99 mg/dL  Troponin I     Status: Abnormal   Collection Time: 02/17/17  9:56 AM  Result Value Ref Range   Troponin I 0.16 (HH) <0.03 ng/mL  Glucose, capillary     Status: Abnormal   Collection Time: 02/17/17 11:38 AM  Result Value Ref Range   Glucose-Capillary 353 (H) 65 - 99 mg/dL  Glucose, capillary     Status: Abnormal   Collection Time: 02/17/17  4:13 PM  Result Value Ref Range   Glucose-Capillary 222 (H) 65 - 99 mg/dL      Imaging Results (Last 48 hours)  Ct Angio Head W Or Wo Contrast  Result Date: 02/17/2017 CLINICAL DATA:  56 year old male with posterior left MCA infarct, suspected high-grade left M2 branch stenosis. EXAM: CT ANGIOGRAPHY HEAD AND NECK TECHNIQUE: Multidetector CT imaging of the head and neck was performed using the standard protocol during bolus administration of  intravenous contrast. Multiplanar CT image reconstructions and MIPs were obtained to evaluate the vascular anatomy. Carotid stenosis measurements (when applicable) are obtained utilizing NASCET criteria, using the distal internal carotid diameter as the denominator. CONTRAST:  50 mL Isovue 370 COMPARISON:  Brain MRI and intracranial MRA 02/16/2017, noncontrast head CT 02/16/2017. FINDINGS: CT HEAD FINDINGS Brain: Hypodense cytotoxic edema has developed in the left parietal lobe/peri-Rolandic cortex corresponding to the diffusion abnormality yesterday. No associated hemorrhage. No intracranial mass effect. Elsewhere stable gray-white matter differentiation. No ventriculomegaly. Calvarium and skull base: No acute osseous abnormality identified. Paranasal sinuses: Mild bubbly opacity now on the left sphenoid sinus. Elsewhere stable mild paranasal sinus mucosal thickening. Orbits: Stable orbit and scalp soft tissues. CTA NECK Skeleton: No acute osseous abnormality  identified. Mild reversal of cervical lordosis. Upper chest: Mild upper lobe dependent atelectasis. No superior mediastinal lymphadenopathy. Other neck: Thyroid, parapharyngeal, retropharyngeal and sublingual spaces are within normal limits. The pharynx and larynx are partially effaced, probably in part due to mild retained secretions. Negative submandibular glands and parotid glands. No cervical lymphadenopathy. Aortic arch: 3 vessel arch configuration, although the left vertebral artery arises from the very proximal left subclavian. No significant arch atherosclerosis. Right carotid system: Negative right CCA origin. Mildly tortuous proximal right CCA. At the level of the hypopharynx there is medial and anterior soft plaque without stenosis (series 501, image 68). Mild soft and calcified plaque at the right ICA origin. Tortuous proximal right ICA with less than 50 % stenosis with respect to the distal vessel at the proximal bulb (series 508, image 54).  Tortuosity within the bulb. Otherwise negative cervical right ICA. Left carotid system: Tortuous proximal left CCA with a mildly kinked appearance. Anterior soft plaque at the level of the hypopharynx without stenosis (series 501, image 67). Soft plaque at the left carotid bifurcation and left ICA origin without stenosis. Soft and calcified plaque at the bulb which is partially retropharyngeal and mildly ectatic (series 505, image 94). No stenosis. The more distal cervical left ICA is negative. Vertebral arteries: No proximal right subclavian artery stenosis in the right vertebral artery origin occurs early without stenosis. Tortuous right V1 segment. Late entry of the right vertebral artery into the cervical transverse foramen. Negative right vertebral artery to the skullbase. No proximal left subclavian artery stenosis and the left vertebral artery origin occurs early. There is severe left vertebral origin stenosis related to soft plaque, best seen on series 505, image 84. The left V1 segment is then tortuous. The left vertebral is otherwise negative to the skullbase. CTA HEAD Posterior circulation: Fairly codominant distal vertebral arteries with no V4 stenosis. Patent vertebrobasilar junction. A ICAs appear dominant, the left is duplicated. No basilar stenosis. The right distal basilar artery has an unusual fenestration and branching of an innominate vessel as seen on series 505, image 111. This does not appear to be a vascular malformation. The SCA and left PCA origins are normal. Fetal type right PCA origin. Diminutive left posterior communicating artery. The left PCA branches are within normal limits. There is moderate to severe irregularity and stenosis of the right PCA P2 best seen on series 503, image 51. Anterior circulation: Both ICA siphons are patent. There is moderate to severe cavernous and supraclinoid calcified plaque worse on the left. Moderate stenosis of both the distal cavernous and proximal  supraclinoid ICA segments (series 508, image 105). Left ophthalmic and posterior communicating artery origins are normal. On the right side there is no hemodynamically significant siphon stenosis. Right ophthalmic and posterior communicating artery origins are normal. Normal carotid termini, MCA and ACA origins. Dominant right ACA A1 segment. Anterior communicating artery and bilateral ACA branches are normal. Right MCA M1 segment, bifurcation, and right MCA branches are within normal limits. Left MCA M1 segment and left MCA bifurcation are patent. There is moderate to severe irregularity and stenosis at the origin of the dominant posterior left M2 branch as seen on series 503, image 52 and series 507, image 27. However, no left MCA branch occlusion is identified. Venous sinuses: Grossly normal. Anatomic variants: Proximal origin of both vertebral arteries. Partially retropharyngeal course of the left carotid. Unusual fenestration and right side innominate branching of the distal third basilar artery. Fetal type right PCA origin. Dominant right A1  segment. Delayed phase: No abnormal enhancement identified. Review of the MIP images confirms the above findings IMPRESSION: 1. Negative for emergent large vessel occlusion or MCA branch occlusion. Positive for moderate to severe irregularity and stenosis of the origin of the posterior left M2 branch (series 507, image 27). 2. Moderate to severe atherosclerosis and stenosis also of the: Left ICA siphon, Right PCA P2 segment, and Left vertebral artery origin. 3. Bilateral CCA and cervical ICA soft and calcified atherosclerosis, and tortuosity, but no significant cervical carotid stenosis. 4. Normal anatomic variation including distal third Basilar artery right side fenestration and innominate branching. 5. Expected evolution of the posterior left MCA infarct. No associated hemorrhage or mass effect. 6. No new acute intracranial abnormality. Electronically Signed   By: Odessa Fleming M.D.   On: 02/17/2017 13:28   Ct Head Wo Contrast  Result Date: 02/16/2017 CLINICAL DATA:  Altered mental status, hypertension, diabetes mellitus, smoker EXAM: CT HEAD WITHOUT CONTRAST TECHNIQUE: Contiguous axial images were obtained from the base of the skull through the vertex without intravenous contrast. COMPARISON:  06/14/2008 FINDINGS: Brain: Generalized atrophy. Normal ventricular morphology. No midline shift or mass effect. Small vessel chronic ischemic changes of deep cerebral white matter. Probable tiny old basal ganglia lacunar infarcts. No intracranial hemorrhage, mass lesion, evidence of acute infarction, or extra-axial fluid collection. Vascular: Atherosclerotic calcifications at the carotid siphons Skull: Intact Sinuses/Orbits: Clear Other: [name] IMPRESSION: Atrophy with small vessel chronic ischemic changes of deep cerebral white matter. Probable small old lacunar infarcts at the basal ganglia. No acute intracranial abnormalities. Electronically Signed   By: Ulyses Southward M.D.   On: 02/16/2017 16:09   Ct Angio Neck W Or Wo Contrast  Result Date: 02/17/2017 CLINICAL DATA:  56 year old male with posterior left MCA infarct, suspected high-grade left M2 branch stenosis. EXAM: CT ANGIOGRAPHY HEAD AND NECK TECHNIQUE: Multidetector CT imaging of the head and neck was performed using the standard protocol during bolus administration of intravenous contrast. Multiplanar CT image reconstructions and MIPs were obtained to evaluate the vascular anatomy. Carotid stenosis measurements (when applicable) are obtained utilizing NASCET criteria, using the distal internal carotid diameter as the denominator. CONTRAST:  50 mL Isovue 370 COMPARISON:  Brain MRI and intracranial MRA 02/16/2017, noncontrast head CT 02/16/2017. FINDINGS: CT HEAD FINDINGS Brain: Hypodense cytotoxic edema has developed in the left parietal lobe/peri-Rolandic cortex corresponding to the diffusion abnormality yesterday. No  associated hemorrhage. No intracranial mass effect. Elsewhere stable gray-white matter differentiation. No ventriculomegaly. Calvarium and skull base: No acute osseous abnormality identified. Paranasal sinuses: Mild bubbly opacity now on the left sphenoid sinus. Elsewhere stable mild paranasal sinus mucosal thickening. Orbits: Stable orbit and scalp soft tissues. CTA NECK Skeleton: No acute osseous abnormality identified. Mild reversal of cervical lordosis. Upper chest: Mild upper lobe dependent atelectasis. No superior mediastinal lymphadenopathy. Other neck: Thyroid, parapharyngeal, retropharyngeal and sublingual spaces are within normal limits. The pharynx and larynx are partially effaced, probably in part due to mild retained secretions. Negative submandibular glands and parotid glands. No cervical lymphadenopathy. Aortic arch: 3 vessel arch configuration, although the left vertebral artery arises from the very proximal left subclavian. No significant arch atherosclerosis. Right carotid system: Negative right CCA origin. Mildly tortuous proximal right CCA. At the level of the hypopharynx there is medial and anterior soft plaque without stenosis (series 501, image 68). Mild soft and calcified plaque at the right ICA origin. Tortuous proximal right ICA with less than 50 % stenosis with respect to the distal vessel at the  proximal bulb (series 508, image 54). Tortuosity within the bulb. Otherwise negative cervical right ICA. Left carotid system: Tortuous proximal left CCA with a mildly kinked appearance. Anterior soft plaque at the level of the hypopharynx without stenosis (series 501, image 67). Soft plaque at the left carotid bifurcation and left ICA origin without stenosis. Soft and calcified plaque at the bulb which is partially retropharyngeal and mildly ectatic (series 505, image 94). No stenosis. The more distal cervical left ICA is negative. Vertebral arteries: No proximal right subclavian artery stenosis  in the right vertebral artery origin occurs early without stenosis. Tortuous right V1 segment. Late entry of the right vertebral artery into the cervical transverse foramen. Negative right vertebral artery to the skullbase. No proximal left subclavian artery stenosis and the left vertebral artery origin occurs early. There is severe left vertebral origin stenosis related to soft plaque, best seen on series 505, image 84. The left V1 segment is then tortuous. The left vertebral is otherwise negative to the skullbase. CTA HEAD Posterior circulation: Fairly codominant distal vertebral arteries with no V4 stenosis. Patent vertebrobasilar junction. A ICAs appear dominant, the left is duplicated. No basilar stenosis. The right distal basilar artery has an unusual fenestration and branching of an innominate vessel as seen on series 505, image 111. This does not appear to be a vascular malformation. The SCA and left PCA origins are normal. Fetal type right PCA origin. Diminutive left posterior communicating artery. The left PCA branches are within normal limits. There is moderate to severe irregularity and stenosis of the right PCA P2 best seen on series 503, image 51. Anterior circulation: Both ICA siphons are patent. There is moderate to severe cavernous and supraclinoid calcified plaque worse on the left. Moderate stenosis of both the distal cavernous and proximal supraclinoid ICA segments (series 508, image 105). Left ophthalmic and posterior communicating artery origins are normal. On the right side there is no hemodynamically significant siphon stenosis. Right ophthalmic and posterior communicating artery origins are normal. Normal carotid termini, MCA and ACA origins. Dominant right ACA A1 segment. Anterior communicating artery and bilateral ACA branches are normal. Right MCA M1 segment, bifurcation, and right MCA branches are within normal limits. Left MCA M1 segment and left MCA bifurcation are patent. There is  moderate to severe irregularity and stenosis at the origin of the dominant posterior left M2 branch as seen on series 503, image 52 and series 507, image 27. However, no left MCA branch occlusion is identified. Venous sinuses: Grossly normal. Anatomic variants: Proximal origin of both vertebral arteries. Partially retropharyngeal course of the left carotid. Unusual fenestration and right side innominate branching of the distal third basilar artery. Fetal type right PCA origin. Dominant right A1 segment. Delayed phase: No abnormal enhancement identified. Review of the MIP images confirms the above findings IMPRESSION: 1. Negative for emergent large vessel occlusion or MCA branch occlusion. Positive for moderate to severe irregularity and stenosis of the origin of the posterior left M2 branch (series 507, image 27). 2. Moderate to severe atherosclerosis and stenosis also of the: Left ICA siphon, Right PCA P2 segment, and Left vertebral artery origin. 3. Bilateral CCA and cervical ICA soft and calcified atherosclerosis, and tortuosity, but no significant cervical carotid stenosis. 4. Normal anatomic variation including distal third Basilar artery right side fenestration and innominate branching. 5. Expected evolution of the posterior left MCA infarct. No associated hemorrhage or mass effect. 6. No new acute intracranial abnormality. Electronically Signed   By: Althea Grimmer.D.  On: 02/17/2017 13:28   Mr Brain Wo Contrast  Result Date: 02/16/2017 CLINICAL DATA:  Initial evaluation for acute slurred speech, right-sided numbness. EXAM: MRI HEAD WITHOUT CONTRAST MRA HEAD WITHOUT CONTRAST TECHNIQUE: Multiplanar, multiecho pulse sequences of the brain and surrounding structures were obtained without intravenous contrast. Angiographic images of the head were obtained using MRA technique without contrast. COMPARISON:  Prior CT from earlier the same day. FINDINGS: MRI HEAD FINDINGS Brain: Mildly advanced cerebral atrophy for  patient age. Patchy T2/FLAIR hyperintensity within the periventricular and deep white matter both cerebral hemispheres most consistent with chronic microvascular ischemic disease, moderate in nature. Abnormal restricted diffusion involving the cortical gray matter of the left parietal lobe, compatible with acute left MCA territory infarct (series 3, image 41). No associated hemorrhage. No mass effect. No other evidence for acute or subacute ischemia. Subcentimeter focus is susceptibility artifact within the left cerebellar hemisphere noted, compatible with a small chronic microhemorrhage. No other evidence for acute or chronic intracranial hemorrhage. No other areas of remote cortical infarction identified. No mass lesion, midline shift, or mass effect. No hydrocephalus. No extra-axial fluid collection. Major dural sinuses are grossly patent. Pituitary gland within normal limits. Vascular: Major intracranial vascular flow voids maintained. Skull and upper cervical spine: Craniocervical junction within normal limits. Visualized upper cervical spine unremarkable. Bone marrow signal intensity within normal limits. No scalp soft tissue abnormality. Sinuses/Orbits: Globes and orbital soft tissues within normal limits. Patient status post lens extraction on the right. Scattered mucosal thickening throughout the paranasal sinuses. No significant mastoid effusion. Inner ear structures within normal limits. Other: No other significant finding. MRA HEAD FINDINGS ANTERIOR CIRCULATION: Study degraded by motion artifact. Distal cervical segments of the internal carotid arteries are patent with antegrade flow. Petrous segments patent bilaterally. Cavernous and supraclinoid right ICA widely patent. There is moderate atheromatous narrowing at the cavernous left ICA at the level of the anterior genu (series 14, image 73). ICA termini patent bilaterally. Right A1 segment dominant and widely patent. Left A1 segment hypoplastic, which  likely accounts for the slightly diminutive left ICA is compared to the right. Anterior communicating artery grossly normal. Anterior cerebral arteries patent to their distal aspects. M1 segments patent without stenosis or occlusion. Possible proximal severe left M2 stenosis (series 452, image 5). Distal MCA branches well opacified distally and fairly symmetric. POSTERIOR CIRCULATION: Vertebral arteries patent to the vertebrobasilar junction. Posterior inferior cerebral arteries not well evaluated on this exam. Basilar artery patent to its distal aspect. Irregularity about the distal basilar artery on reconstructions, likely combination of motion and vessel tortuosity, poorly evaluated on this exam. Left PCA appears to be supplied primarily via the basilar and is patent to its distal aspect. Right P1 segment appears hypoplastic with a prominent right posterior communicating artery present. Right PCA also grossly patent to its distal aspect. Multifocal atheromatous irregularity suspected. IMPRESSION: MRI HEAD IMPRESSION: 1. 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 No significant mass effect. 2. Mildly advanced cerebral atrophy for patient age with moderate chronic microvascular ischemic disease. MRA HEAD IMPRESSION: 1. Negative MRA for large or proximal arterial branch occlusion. 2. 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 3. Probable multifocal atheromatous disease involving the PCAs bilaterally, right greater than left. 4. Irregularity about the distal basilar artery, likely combination of motion and vessel tortuosity, poorly evaluated on this exam. A follow-up study to ensure no underlying aneurysm or vascular abnormality is present is suggested. Electronically Signed  By: Rise Mu M.D.   On: 02/16/2017 22:44   Dg Chest Port 1 View  Result Date: 02/16/2017 CLINICAL DATA:  Altered mental  status. EXAM: PORTABLE CHEST 1 VIEW COMPARISON:  04/19/2015. FINDINGS: Mediastinum and hilar structures normal. Cardiomegaly with pulmonary vascular prominence. No focal infiltrate. No pleural effusion or pneumothorax. No acute bony abnormality. IMPRESSION: Cardiomegaly.  No evidence CHF.  No focal pulmonary infiltrate. Electronically Signed   By: [name]  Register   On: 02/16/2017 15:57   Mr Maxine Glenn Head/brain Wo Cm  Result Date: 02/16/2017 CLINICAL DATA:  Initial evaluation for acute slurred speech, right-sided numbness. EXAM: MRI HEAD WITHOUT CONTRAST MRA HEAD WITHOUT CONTRAST TECHNIQUE: Multiplanar, multiecho pulse sequences of the brain and surrounding structures were obtained without intravenous contrast. Angiographic images of the head were obtained using MRA technique without contrast. COMPARISON:  Prior CT from earlier the same day. FINDINGS: MRI HEAD FINDINGS Brain: Mildly advanced cerebral atrophy for patient age. Patchy T2/FLAIR hyperintensity within the periventricular and deep white matter both cerebral hemispheres most consistent with chronic microvascular ischemic disease, moderate in nature. Abnormal restricted diffusion involving the cortical gray matter of the left parietal lobe, compatible with acute left MCA territory infarct (series 3, image 41). No associated hemorrhage. No mass effect. No other evidence for acute or subacute ischemia. Subcentimeter focus is susceptibility artifact within the left cerebellar hemisphere noted, compatible with a small chronic microhemorrhage. No other evidence for acute or chronic intracranial hemorrhage. No other areas of remote cortical infarction identified. No mass lesion, midline shift, or mass effect. No hydrocephalus. No extra-axial fluid collection. Major dural sinuses are grossly patent. Pituitary gland within normal limits. Vascular: Major intracranial vascular flow voids maintained. Skull and upper cervical spine: Craniocervical junction within  normal limits. Visualized upper cervical spine unremarkable. Bone marrow signal intensity within normal limits. No scalp soft tissue abnormality. Sinuses/Orbits: Globes and orbital soft tissues within normal limits. Patient status post lens extraction on the right. Scattered mucosal thickening throughout the paranasal sinuses. No significant mastoid effusion. Inner ear structures within normal limits. Other: No other significant finding. MRA HEAD FINDINGS ANTERIOR CIRCULATION: Study degraded by motion artifact. Distal cervical segments of the internal carotid arteries are patent with antegrade flow. Petrous segments patent bilaterally. Cavernous and supraclinoid right ICA widely patent. There is moderate atheromatous narrowing at the cavernous left ICA at the level of the anterior genu (series 14, image 73). ICA termini patent bilaterally. Right A1 segment dominant and widely patent. Left A1 segment hypoplastic, which likely accounts for the slightly diminutive left ICA is compared to the right. Anterior communicating artery grossly normal. Anterior cerebral arteries patent to their distal aspects. M1 segments patent without stenosis or occlusion. Possible proximal severe left M2 stenosis (series 452, image 5). Distal MCA branches well opacified distally and fairly symmetric. POSTERIOR CIRCULATION: Vertebral arteries patent to the vertebrobasilar junction. Posterior inferior cerebral arteries not well evaluated on this exam. Basilar artery patent to its distal aspect. Irregularity about the distal basilar artery on reconstructions, likely combination of motion and vessel tortuosity, poorly evaluated on this exam. Left PCA appears to be supplied primarily via the basilar and is patent to its distal aspect. Right P1 segment appears hypoplastic with a prominent right posterior communicating artery present. Right PCA also grossly patent to its distal aspect. Multifocal atheromatous irregularity suspected. IMPRESSION:  MRI HEAD IMPRESSION: 1. 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 No significant mass effect. 2. Mildly advanced cerebral atrophy for patient age with moderate chronic microvascular ischemic  disease. MRA HEAD IMPRESSION: 1. Negative MRA for large or proximal arterial branch occlusion. 2. 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 3. Probable multifocal atheromatous disease involving the PCAs bilaterally, right greater than left. 4. Irregularity about the distal basilar artery, likely combination of motion and vessel tortuosity, poorly evaluated on this exam. A follow-up study to ensure no underlying aneurysm or vascular abnormality is present is suggested. Electronically Signed   By: Rise Mu M.D.   On: 02/16/2017 22:44     Assessment/Plan: Diagnosis: left MCA infarct 1. Does the need for close, 24 hr/day medical supervision in concert with the patient's rehab needs make it unreasonable for this patient to be served in a less intensive setting? Yes 2. Co-Morbidities requiring supervision/potential complications: HTN, chronic CHF/elevated troponin, visual deficits 3. Due to bladder management, bowel management, safety, skin/wound care, disease management, medication administration, pain management and patient education, does the patient require 24 hr/day rehab nursing? Yes 4. Does the patient require coordinated care of a physician, rehab nurse, PT (1-2 hrs/day, 5 days/week), OT (1-2 hrs/day, 5 days/week) and SLP (1-2 hrs/day, 5 days/week) to address physical and functional deficits in the context of the above medical diagnosis(es)? Yes Addressing deficits in the following areas: balance, endurance, locomotion, strength, transferring, bowel/bladder control, bathing, dressing, feeding, grooming, toileting, cognition, speech, swallowing and psychosocial support 5. Can the patient actively  participate in an intensive therapy program of at least 3 hrs of therapy per day at least 5 days per week? Yes 6. The potential for patient to make measurable gains while on inpatient rehab is excellent 7. Anticipated functional outcomes upon discharge from inpatient rehab are modified independent and supervision  with PT, modified independent and supervision with OT, modified independent and supervision with SLP. 8. Estimated rehab length of stay to reach the above functional goals is: 7-10 days 9. Does the patient have adequate social supports and living environment to accommodate these discharge functional goals? Yes 10. Anticipated D/C setting: Home 11. Anticipated post D/C treatments: HH therapy and Outpatient therapy 12. Overall Rehab/Functional Prognosis: excellent  RECOMMENDATIONS: This patient's condition is appropriate for continued rehabilitative care in the following setting: CIR Patient has agreed to participate in recommended program. Yes Note that insurance prior authorization may be required for reimbursement for recommended care.  Comment: Rehab Admissions Coordinator to follow up.  Thanks,  Ranelle Oyster, MD, [place] [facility]    Ranelle Oyster, MD 02/17/2017     Routing History

## 2017-02-19 NOTE — Progress Notes (Signed)
Received pt. As a transfer from 5 C.Pt has been oriented to the unit routine and protocol.Safety plan was explained,fall prevention plan was explained and signed by RN,pt. Unable to sign due to blindness.

## 2017-02-19 NOTE — Care Management Note (Signed)
Case Management Note  Patient Details  Name: Brady Church MRN: [id number] Date of Birth: January 17, 1961  Subjective/Objective:                    Action/Plan: Pt discharging to CIR. No further needs per CM.   Expected Discharge Date:  02/19/17               Expected Discharge Plan:  IP Rehab Facility  In-House Referral:     Discharge planning Services     Post Acute Care Choice:    Choice offered to:     DME Arranged:    DME Agency:     HH Arranged:    HH Agency:     Status of Service:  Completed, signed off  If discussed at [employer], dates discussed:    Additional Comments:  Kermit Balo, RN 02/19/2017, 12:21 PM

## 2017-02-19 NOTE — PMR Pre-admission (Signed)
PMR Admission Coordinator Pre-Admission Assessment  Patient: Brady Church is an 55 y.o., male MRN: [id number] DOB: 09-Oct-1961 Height: 5\' 8"  (172.7 cm) Weight: 97.7 kg (215 lb 4.8 oz)              Insurance Information HMO: No    PPO:       PCP:       IPA:       80/20:       OTHER:   PRIMARY:  Medicaid  access      Policy#: [id number]      Subscriber:  Brady Church CM Name:        Phone#:       Fax#:   Pre-Cert#:        Employer:  Disabled/Unemployed Benefits:  Phone #: [phone]     Name: Automated Eff. Date: Eligible 02/19/17 with coverage code [id number]     Deduct:        Out of Pocket Max:        Life Max:   CIR:        SNF:   Outpatient:       Co-Pay:   Home Health:        Co-Pay:   DME:       Co-Pay:   Providers:    Medicaid Application Date:        Case Manager:   Disability Application Date:        Case Worker:    Emergency Contact Information Contact Information    Name Relation Home Work Mobile   Evans,Lila Other   [phone]     Current Medical History  Patient Admitting Diagnosis:  L MCA infarct  History of Present Illness: A 56 y.o. male with a history of DM2, CHF, polysubstance abuse, who presented on 02/16/17 with slurred speech, word finding deficits, right sided sensory changes,  and staggering gait. MRI revealed an acute ischemic non-hemorrhagic posterior left MCA infarct. Cardiology following for elevated troponin and feels that the elevation is not an acute syndrome.  Pt was seen by physical and occupational therapy who found the patient to have substantial functional deficits. PM&R was consulted to assess for rehab needs.    Total: 6=NIH  Past Medical History  Past Medical History:  Diagnosis Date  . 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 Diabetes mellitus   . High cholesterol   . Hypertension   . Substance abuse    cocaine. Stopped in 2010  . Tobacco use disorder 10/07/2014    Family History  family history  includes Cancer (age of onset: 37) in his father; Coronary artery disease (age of onset: 31) in his mother; Diabetes in his maternal grandmother; Hypertension in his father.  Prior Rehab/Hospitalizations: No previous rehab  Has the patient had major surgery during 100 days prior to admission? No  Current Medications   Current Facility-Administered Medications:  .  acetaminophen (TYLENOL) tablet 650 mg, 650 mg, Oral, Q4H PRN, 650 mg at 02/18/17 1853 **OR** acetaminophen (TYLENOL) solution 650 mg, 650 mg, Per Tube, Q4H PRN **OR** acetaminophen (TYLENOL) suppository 650 mg, 650 mg, Rectal, Q4H PRN, Gwynn Burly, DO .  aspirin EC tablet 325 mg, 325 mg, Oral, Daily, Marvel Plan, MD, 325 mg at 02/19/17 0920 .  atorvastatin (LIPITOR) tablet 80 mg, 80 mg, Oral, q1800, Gwynn Burly, DO, 80 mg at 02/18/17 1733 .  brimonidine (ALPHAGAN) 0.2 % ophthalmic solution 1 drop, 1 drop, Both Eyes, BID,  Althia Forts, MD, 1 drop at 02/19/17 [phone] .  clopidogrel (PLAVIX) tablet 75 mg, 75 mg, Oral, Daily, Gwynn Burly, DO, 75 mg at 02/19/17 0930 .  Difluprednate 0.05 % EMUL 1 drop, 1 drop, Both Eyes, BID, Gwynn Burly, DO, 1 drop at 02/18/17 2049 .  dorzolamide-timolol (COSOPT) 22.3-6.8 MG/ML ophthalmic solution 1 drop, 1 drop, Right Eye, BID, Gwynn Burly, DO, 1 drop at 02/19/17 0925 .  enoxaparin (LOVENOX) injection 40 mg, 40 mg, Subcutaneous, QHS, Gwynn Burly, DO, 40 mg at 02/18/17 2048 .  folic acid (FOLVITE) tablet 1 mg, 1 mg, Oral, Daily, Gwynn Burly, DO, 1 mg at 02/19/17 0920 .  hydrochlorothiazide (HYDRODIURIL) tablet 25 mg, 25 mg, Oral, Daily, Althia Forts, MD, 25 mg at 02/19/17 0920 .  insulin aspart (novoLOG) injection 0-20 Units, 0-20 Units, Subcutaneous, TID WC, John Giovanni, MD, 7 Units at 02/19/17 1220 .  insulin aspart (novoLOG) injection 0-5 Units, 0-5 Units, Subcutaneous, QHS, John Giovanni, MD, 4 Units at 02/18/17 2238 .  insulin glargine (LANTUS) injection 10 Units, 10 Units,  Subcutaneous, Daily, Althia Forts, MD, 10 Units at 02/19/17 1220 .  lisinopril (PRINIVIL,ZESTRIL) tablet 10 mg, 10 mg, Oral, Daily, Althia Forts, MD, 10 mg at 02/19/17 0930 .  multivitamin with minerals tablet 1 tablet, 1 tablet, Oral, Daily, Gwynn Burly, DO, 1 tablet at 02/19/17 0920 .  prochlorperazine (COMPAZINE) tablet 5 mg, 5 mg, Oral, Q6H PRN, John Giovanni, MD, 5 mg at 02/18/17 2048 .  senna-docusate (Senokot-S) tablet 1 tablet, 1 tablet, Oral, QHS PRN, Gwynn Burly, DO .  thiamine (VITAMIN B-1) tablet 100 mg, 100 mg, Oral, Daily, 100 mg at 02/19/17 0920 **OR** [DISCONTINUED] thiamine (B-1) injection 100 mg, 100 mg, Intravenous, Daily, Gwynn Burly, DO  Patients Current Diet: Diet heart healthy/carb modified Room service appropriate? Yes; Fluid consistency: Thin Diet - low sodium heart healthy  Precautions / Restrictions Precautions Precautions: Fall Restrictions Weight Bearing Restrictions: No   Has the patient had 2 or more falls or a fall with injury in the past year?No  Prior Activity Level Community (5-7x/wk): Went out daily.  Was not driving.  Takes the bus.  Home Assistive Devices / Equipment Home Assistive Devices/Equipment: Cane (specify quad or straight) (straight)  Prior Device Use: Indicate devices/aids used by the patient prior to current illness, exacerbation or injury? Blindness cane  Prior Functional Level Prior Function Level of Independence: Independent with assistive device(s) Comments: Pt uses a cane to navigate environment due to blindness  Self Care: Did the patient need help bathing, dressing, using the toilet or eating?  Independent  Indoor Mobility: Did the patient need assistance with walking from room to room (with or without device)? Independent  Stairs: Did the patient need assistance with internal or external stairs (with or without device)? Independent  Functional Cognition: Did the patient need help planning regular tasks such as  shopping or remembering to take medications? Independent  Current Functional Level Cognition  Arousal/Alertness: Awake/alert Overall Cognitive Status: Impaired/Different from baseline Orientation Level: Oriented to person, Oriented to place, Oriented to situation, Disoriented to person Safety/Judgement: Decreased awareness of deficits, Decreased awareness of safety General Comments: able to see results of impairments this session dropping cane down stairs due to R UE numbness Attention: Selective Selective Attention: Appears intact Memory: Appears intact Awareness: Appears intact Safety/Judgment: Appears intact    Extremity Assessment (includes Sensation/Coordination)  Upper Extremity Assessment: RUE deficits/detail RUE Deficits / Details: hand has no sensation, RUE from wrist to shoulder can sense deep pressure, able to  hold arm out straight in forward flexion for 10 seconds without drift, and 4+ overall in strength, able to form grasp with 4/5 strength and unable to perform finger to thumb task RUE Sensation: decreased proprioception, decreased light touch RUE Coordination: decreased fine motor, decreased gross motor  Lower Extremity Assessment: Overall WFL for tasks assessed    ADLs  Overall ADL's : Needs assistance/impaired Eating/Feeding: Moderate assistance Grooming: Wash/dry face, Set up, Sitting Upper Body Bathing: Minimal assistance Lower Body Bathing: Moderate assistance, Cueing for safety Upper Body Dressing : Moderate assistance Lower Body Dressing: Maximal assistance Toilet Transfer: Ambulation, Comfort height toilet, Cueing for safety, Moderate assistance (hand held assist) Toilet Transfer Details (indicate cue type and reason): simulated Toileting- Clothing Manipulation and Hygiene: Moderate assistance Tub/ Shower Transfer: Moderate assistance Functional mobility during ADLs: Minimal assistance (hand held assist)    Mobility  Overal bed mobility: Needs  Assistance Bed Mobility: Supine to Sit Supine to sit: Modified independent (Device/Increase time) General bed mobility comments: use of bed rails to and vc for sequencing    Transfers  Overall transfer level: Needs assistance Equipment used: Straight cane (white cane) Transfers: Sit to/from Stand Sit to Stand: Supervision General transfer comment: for safety with cane in R hand initially    Ambulation / Gait / Stairs / Wheelchair Mobility  Ambulation/Gait Ambulation/Gait assistance: Min guard Ambulation Distance (Feet): 115 Feet (with standing breaks) Assistive device: Straight cane (white cane) Gait Pattern/deviations: Step-through pattern General Gait Details: assist for safety, stability due to limited vision, kept cane in L hand and using appropriately Gait velocity: decreased Stairs: Yes Stairs assistance: Min assist, Mod assist Stair Management: One rail Left, Alternating pattern, Forwards Number of Stairs: 10 General stair comments: assist for safety due to dropping cane out of R hand on descent and increased assist for safety during that moment    Posture / Balance Dynamic Sitting Balance Sitting balance - Comments: sitting EOB with no back support Balance Overall balance assessment: Needs assistance Sitting-balance support: Feet supported Sitting balance-Leahy Scale: Good Sitting balance - Comments: sitting EOB with no back support Standing balance support: No upper extremity supported Standing balance-Leahy Scale: Fair Standing balance comment: initial standing holding cane in air, no LOB, but some wide stance and seems unable to take challenge Standardized Balance Assessment Standardized Balance Assessment : Dynamic Gait Index Dynamic Gait Index Level Surface: Mild Impairment Change in Gait Speed: Moderate Impairment Gait with Horizontal Head Turns: Moderate Impairment Gait with Vertical Head Turns: Mild Impairment Gait and Pivot Turn: Mild Impairment Step Over  Obstacle: Mild Impairment Step Around Obstacles: Mild Impairment Steps: Mild Impairment Total Score: 14    Special needs/care consideration BiPAP/CPAP No CPM No Continuous Drip IV No Dialysis No      Life Vest No Oxygen No Special Bed No Trach Size No Wound Vac (area) No    Skin No                            Bowel mgmt: Last BM 02/18/17 Bladder mgmt: Voiding in bathroom with assist Diabetic mgmt Yes, on oral medication at home Blind:  Is at least legallly blind and uses a walking cane for the blind.   Previous Home Environment Living Arrangements: Alone  Lives With: Alone Available Help at Discharge: Friend(s) Type of Home: Apartment (apartment is on the 3rd level.) Home Layout: One level Home Access: Stairs to enter [employer] of Steps: 17 steps to 3rd level apartment Home  Care Services: No  Discharge Living Setting Plans for Discharge Living Setting: House, Lives with (comment) (Plans home with ex-girlfriend, [name].) Type of Home at Discharge: House Discharge Home Layout: One level Discharge Home Access: Stairs to enter Entrance Stairs-Number of Steps: 3 Does the patient have any problems obtaining your medications?: No  Social/Family/Support Systems Patient Roles: Other (Comment) (Has ex-girlfriend, friends, sister, brothers.) Contact Information: [name] - ex-girlfriend Anticipated Caregiver: Lila - ex-GF Anticipated Caregiver's Contact Information: Maxie Better [phone] Ability/Limitations of Caregiver: Maxie Better is an ex-girlfriend.  Paitent was with her 8 years.  She can provide supervision after discharge. Caregiver Availability: 24/7 Discharge Plan Discussed with Primary Caregiver: Yes Is Caregiver In Agreement with Plan?: Yes Does Caregiver/Family have Issues with Lodging/Transportation while Pt is in Rehab?: No  Goals/Additional Needs Patient/Family Goal for Rehab: PT/OT/SLP mod I and supervision goals Expected length of stay: 7-10  days Cultural Considerations: Holiness Dietary Needs: Heart healthy, carb mod, thin liquids Equipment Needs: TBD Additional Information: Patient is not close to sister or brothers and did not want me to contact them.  Parents are deceased and he has no children.  He wants his contact person to be [name].  He is under Hippa restriction status so no information to be given without his permission. Pt/Family Agrees to Admission and willing to participate: Yes Program Orientation Provided & Reviewed with Pt/Caregiver Including Roles  & Responsibilities: Yes  Decrease burden of Care through IP rehab admission: [name]  Possible need for SNF placement upon discharge: Not anticipated  Patient Condition: This patient's condition remains as documented in the consult dated 02/17/17, in which the Rehabilitation Physician determined and documented that the patient's condition is appropriate for intensive rehabilitative care in an inpatient rehabilitation facility. Will admit to inpatient rehab today.  Preadmission Screen Completed By:  Trish Mage, 02/19/2017 12:40 PM ______________________________________________________________________   Discussed status with Dr. Riley Kill on 02/19/17 at 1240 and received telephone approval for admission today.  Admission Coordinator:  Trish Mage, time 1240/Date 02/19/17

## 2017-02-19 NOTE — Telephone Encounter (Signed)
Hospital TOC per Dr Laural Benes, discharge 02/19/2017, appt 03/02/2017.

## 2017-02-19 NOTE — Discharge Summary (Signed)
Name: Brady Church MRN: [id number] DOB: 07/02/1961 56 y.o. PCP: Servando Snare, MD  Date of Admission: 02/16/2017  2:57 PM Date of Discharge: 02/19/2017 Attending Physician: Inez Catalina, MD  Discharge Diagnosis: 1. CVA 2. Intracranial vascular stenosis 3. Elevated troponin 4. Prolonged QT 5. Hypertension 6. Type 2 diabetes mellitus 7. Substance abuse  Principal Problem:   CVA (cerebral vascular accident) (HCC) Active Problems:   Hypertension   Hyperlipidemia LDL goal <100   Diabetes mellitus with diabetic cataract (HCC)   Chronic combined systolic and diastolic congestive heart failure (HCC)   Elevated troponin   Slurred speech   Intracranial vascular stenosis   Substance abuse   Prolonged QT interval   Hyperglycemia   Abnormal electrocardiogram (ECG) (EKG)   Discharge Medications: Allergies as of 02/19/2017   No Known Allergies     Medication List    STOP taking these medications   carvedilol 6.25 MG tablet Commonly known as:  COREG   lovastatin 20 MG tablet Commonly known as:  MEVACOR     TAKE these medications   ACCU-CHEK FASTCLIX LANCETS Misc 1 each by Does not apply route 2 (two) times daily.   acetaminophen 325 MG tablet Commonly known as:  TYLENOL Take 2 tablets (650 mg total) by mouth every 4 (four) hours as needed for mild pain (or temp > 37.5 C (99.5 F)).   aspirin 325 MG EC tablet Take 1 tablet (325 mg total) by mouth daily. Start taking on:  02/20/2017 What changed:  See the new instructions.   atorvastatin 80 MG tablet Commonly known as:  LIPITOR Take 1 tablet (80 mg total) by mouth daily at 6 PM.   brimonidine 0.2 % ophthalmic solution Commonly known as:  ALPHAGAN Place 1 drop into both eyes 2 (two) times daily.   clopidogrel 75 MG tablet Commonly known as:  PLAVIX Take 1 tablet (75 mg total) by mouth daily. Start taking on:  02/20/2017   dorzolamide-timolol 22.3-6.8 MG/ML ophthalmic solution Commonly known as:   COSOPT Place 1 drop into the right eye 2 (two) times daily.   folic acid 1 MG tablet Commonly known as:  FOLVITE Take 1 tablet (1 mg total) by mouth daily. Start taking on:  02/20/2017   glucose blood test strip Commonly known as:  ACCU-CHEK SMARTVIEW Use as instructed   hydrochlorothiazide 25 MG tablet Commonly known as:  HYDRODIURIL Take 1 tablet (25 mg total) by mouth daily.   insulin aspart 100 UNIT/ML injection Commonly known as:  novoLOG Inject 0-20 Units into the skin 3 (three) times daily with meals.   insulin aspart 100 UNIT/ML injection Commonly known as:  novoLOG Inject 0-5 Units into the skin at bedtime.   insulin glargine 100 UNIT/ML injection Commonly known as:  LANTUS Inject 0.1 mLs (10 Units total) into the skin daily.   lisinopril 10 MG tablet Commonly known as:  PRINIVIL,ZESTRIL Take 1 tablet (10 mg total) by mouth daily. Start taking on:  02/20/2017 What changed:  medication strength  how much to take   metFORMIN 1000 MG tablet Commonly known as:  GLUCOPHAGE Take 1 tablet (1,000 mg total) by mouth 2 (two) times daily with a meal.   Omeprazole 20 MG Tbec Take 1 tablet (20 mg total) by mouth daily.   senna-docusate 8.6-50 MG tablet Commonly known as:  Senokot-S Take 1 tablet by mouth at bedtime as needed for moderate constipation.   thiamine 100 MG tablet Take 1 tablet (100 mg total) by mouth daily. Start  taking on:  02/20/2017       Disposition and follow-up:   Brady Church was discharged from [facility] in Stable condition.  At the hospital follow up visit please address:  1. CVA - assess for improvement or worsening of his dysarthria, expressive aphasia, and right arm numbness  Intracranial vascular stenosis - assess for new neurologic complaints, any blood pressure-dependent changes in exam or alertness, adherence with Aspirin and Plavix daily, stop Plavix by 6/24  Elevated troponin - assess for any chest pain or  ACS symptoms  Prolonged QT - assess QT length, electrolytes, and avoid any QT prolonging medications  Hypertension - assess blood pressure control  Type 2 diabetes mellitus - assess glycemic control, titrate long-acting insulin dose as necessary  Substance abuse - assess for tobacco, cocaine, alcohol, or other drug use  2.  Labs / imaging needed at time of follow-up: BMP, EKG  3.  Pending labs/ test needing follow-up: None  Follow-up Appointments: Follow-up Information    Nilda Riggs, NP. Schedule an appointment as soon as possible for a visit in 6 week(s).   Specialty:  Family Medicine Contact information: [address] [place] [address] [phone]           Hospital Course by problem list: Principal Problem:   CVA (cerebral vascular accident) [facility]) Active Problems:   Hypertension   Hyperlipidemia LDL goal <100   Diabetes mellitus with diabetic cataract (HCC)   Chronic combined systolic and diastolic congestive heart failure (HCC)   Elevated troponin   Slurred speech   Intracranial vascular stenosis   Substance abuse   Prolonged QT interval   Hyperglycemia   Abnormal electrocardiogram (ECG) (EKG)   1. CVA  Brady Church is a 56 y.o. gentleman with PMH T2DM, HTN, HLD, CHF (LVEF 40-45%, G1DD, 12/2014), blindness, tobacco use, cocaine abuse who presented for altered mental status on 3/23 and was found to significant dysarthria, expressive aphasia, and dense right hand/arm numbness. He received Aspirin 325 mg but did not receive tPA. He reported taking daily Aspirin 81 mg at home. CT head showed no acute findings and follow up MRI revealed posterior L MCA cortical stroke. MRA revealed significant CVD burden. CTA head and neck confirmed numerous intracranial vascular stenoses. Neurology was consulted. Permissive hypertension was allowed for and he was started on Plavix 75 mg and Aspirin increased to 325 mg daily given his severe CVD. Lipitor was  started at 80 mg daily. PT/OT/SLP evaluated the patient and felt he would benefit greatly from inpatient rehabilitation. The patient reported slight improvement in his dysarthria/aphasia and hand numbness in the ensuing days. We arranged for inpatient rehab and he was deemed stable for discharge on 3/26 with PCP and neurology follow up.  Intracranial vascular stenosis - during workup for acute stroke, MRA brain revealed no large vessel occlusions but moderate atheromatous stenosis at the cavernous left ICA, possible high-grade proximal left M2 stenosis, multifocal atheromatous disease involving the PCAs bilaterally, irregularity about the distal basilar artery due to motion and vessel tortuosity. Follow up CT angio of head and neck moderate to severe irregularity and stenosis of the origin of the posterior left M2 branch, moderate to severe atherosclerosis and stenosis also of the left ICA siphon, right PCA P2 segment, and left vertebral artery origin. Additionally CTA found bilateral CCA and cervical ICA soft and calcified atherosclerosis and tortuosity, but no significant cervical stenosis. He was started on Aspirin 325 mg, Plavix 75 mg, Atorvastatin 80 mg daily  given this recent stroke and significant cerebrovascular disease burden.  Elevated troponin - on admission was found to have elevated troponin to 0.21 and new diffuse T wave inversions on EKG, although no chest pain, dyspnea, or palpitations. Cocaine positive UDS. Cardiology consulted and evaluated the patient. Serial troponins trended down and daily EKGs showed gradually improvement and normalization of T waves in some leads, although remains grossly abnormal. No chest pain or dyspnea, troponin and EKG changes could be attributed to acute CVA. Telemetry revealed no significant events. TTE revealed no new wall motion abnormalities.   Prolonged QT - EKG on presentation revealed QT prolongation to 570 ms and QTc 658 ms. All QT prolonging medications  were avoided during hospital stay and serial EKGs showed mild improvement to low 500s.   Hypertension - presented normotensive and reported that he had not taking any of his blood pressure medications for several days until noon on the day of admission. Home blood pressure meds included Lisinopril 40 mg, HCTZ 25 mg, and Carvedilol 6.25 mg BID. Blood pressure medications were held for about 72 hours for permissive hypertension in the setting of acute stroke and significant cerebrovascular disease burden. He remained only mildly hypertensive to 150s/90s. On 3/26 HCTZ 25 mg and reduced-dose Lisinopril 10 mg daily were started, with plan to gradually resume his home antihypertensive regimen over the ensuring days.  Type 2 diabetes mellitus, poorly controlled - found to have significant hyperglycemia to 470-500 on presentation, reported several weeks of noncompliance with home Metformin 1000 mg BID. HbA1c was checked and found to be 14.4! from 7.5 in 08/2016. We initially achieved adequate blood sugar control with moderate sliding scale insulin however he began eating full meals and outside food brought by friends/family and his blood sugars increased to high 200 and 300s on 3/25 to 3/26. We adjusted him to resistant dosing sliding scale insulin and started Lantus 10 units daily on 3/26, which will likely require up-titration.  Substance abuse - reported smoking 0.5 packs per day of cigarettes and drinking 1 40oz Church daily on presentation, UDS also revealed recent cocaine use. Counseled patient on the significant need for cessation of all these substances, as they likely contributed to his stroke. He was placed on CIWA protocol from 3/23 to 3/26 and developed no significant withdrawal symptoms and required no Ativan and this was discontinued on 3/26.   Discharge Vitals:   BP (!) 154/95 (BP Location: Right Arm)   Pulse 84   Temp 98.2 F (36.8 C) (Oral)   Resp 20   Ht 5\' 8"  (1.727 m)   Wt 215 lb 4.8 oz  (97.7 kg)   SpO2 96%   BMI 32.74 kg/m   Pertinent Labs, Studies, and Procedures:  BMP Latest Ref Rng & Units 02/18/2017 02/16/2017 02/16/2017  Glucose 65 - 99 mg/dL 161(W) 960(AV) 409(W)  BUN 6 - 20 mg/dL 9 15 13   Creatinine 0.61 - 1.24 mg/dL 1.19 1.47 8.29  BUN/Creat Ratio 9 - 20 - - -  Sodium 135 - 145 mmol/L 133(L) 134(L) 132(L)  Potassium 3.5 - 5.1 mmol/L 3.3(L) 3.7 3.8  Chloride 101 - 111 mmol/L 97(L) 96(L) 96(L)  CO2 22 - 32 mmol/L 24 - 24  Calcium 8.9 - 10.3 mg/dL 8.9 - 8.8(L)    Recent Labs Lab 02/18/17 1221 02/18/17 1602 02/18/17 2207 02/19/17 0608 02/19/17 1108  GLUCAP 370* 286* 309* 255* 228*   Lipid Panel     Component Value Date/Time   CHOL 225 (H) 02/17/2017 0448  TRIG 174 (H) 02/17/2017 0448   HDL 31 (L) 02/17/2017 0448   CHOLHDL 7.3 02/17/2017 0448   VLDL 35 02/17/2017 0448   LDLCALC 159 (H) 02/17/2017 0448   Drugs of Abuse     Component Value Date/Time   LABOPIA NONE DETECTED 02/16/2017 1531   COCAINSCRNUR POSITIVE (A) 02/16/2017 1531   COCAINSCRNUR NEG 07/21/2009 2043   LABBENZ NONE DETECTED 02/16/2017 1531   LABBENZ NEG 07/21/2009 2043   AMPHETMU NONE DETECTED 02/16/2017 1531   THCU NONE DETECTED 02/16/2017 1531   LABBARB NONE DETECTED 02/16/2017 1531    Results for Brady Church, Brady Church (MRN [id number]) as of 02/19/2017 10:45  Ref. Range 02/17/2017 04:48  Hemoglobin A1C Latest Ref Range: 4.8 - 5.6 % 14.4 (H)      Recent Labs Lab 02/16/17 2315 02/17/17 0448 02/17/17 0956  TROPONINI 0.20* 0.18* 0.16*   02/17/17 TTE Study Conclusions - Left ventricle: The cavity size was normal. Wall thickness was   increased in a pattern of moderate LVH. Systolic function was   vigorous. The estimated ejection fraction was in the range of 65%   to 70%. Wall motion was normal; there were no regional wall   motion abnormalities. Doppler parameters are consistent with   abnormal left ventricular relaxation (grade 1 diastolic   dysfunction). - Aortic valve:  Valve area (VTI): 3.57 cm^2. Valve area (Vmax):   3.12 cm^2. Valve area (Vmean): 2.85 cm^2. - Atrial septum: No defect or patent foramen ovale was identified. - Technically adequate study.  02/17/17 VAS US Duplex Lower Extremities Summary: No evidence of deep vein or superficial thrombosis involving the right lower extremity and left lower extremity.  Ct Angio Head W Or Wo Contrast + Ct Angio Neck W Or Wo Contrast IMPRESSION: 1. Negative for emergent large vessel occlusion or MCA branch occlusion. Positive for moderate to severe irregularity and stenosis of the origin of the posterior left M2 branch (series 507, image 27). 2. Moderate to severe atherosclerosis and stenosis also of the: Left ICA siphon, Right PCA P2 segment, and Left vertebral artery origin. 3. Bilateral CCA and cervical ICA soft and calcified atherosclerosis, and tortuosity, but no significant cervical carotid stenosis. 4. Normal anatomic variation including distal third Basilar artery right side fenestration and innominate branching. 5. Expected evolution of the posterior left MCA infarct. No associated hemorrhage or mass effect. 6. No new acute intracranial abnormality. Electronically Signed   By: Odessa Fleming M.D.   On: 02/17/2017 13:28   Ct Head Wo Contrast IMPRESSION: Atrophy with small vessel chronic ischemic changes of deep cerebral white matter. Probable small old lacunar infarcts at the basal ganglia. No acute intracranial abnormalities. Electronically Signed   By: Ulyses Southward M.D.   On: 02/16/2017 16:09   Mr Brain Wo Contrast + Mr Maxine Glenn Head/brain Wo Cm MRI HEAD IMPRESSION: 1. 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 No significant mass effect. 2. Mildly advanced cerebral atrophy for patient age with moderate chronic microvascular ischemic disease.  MRA HEAD IMPRESSION: 1. Negative MRA for large or proximal arterial branch occlusion. 2. Moderate atheromatous stenosis at the cavernous left ICA, with  possible additional high-grade proximal left M2 stenosis. 3. Probable multifocal atheromatous disease involving the PCAs bilaterally, right greater than left. 4. Irregularity about the distal basilar artery, likely combination of motion and vessel tortuosity, poorly evaluated on this exam. A follow-up study to ensure no underlying aneurysm or vascular abnormality is present is suggested. Electronically Signed   By: Janell Quiet.D.  On: 02/16/2017 22:44   Dg Chest Port 1 View Result Date: 02/16/2017 CLINICAL DATA:  Altered mental status. EXAM: PORTABLE CHEST 1 VIEW COMPARISON:  04/19/2015. FINDINGS: Mediastinum and hilar structures normal. Cardiomegaly with pulmonary vascular prominence. No focal infiltrate. No pleural effusion or pneumothorax. No acute bony abnormality. IMPRESSION: Cardiomegaly.  No evidence CHF.  No focal pulmonary infiltrate. Electronically Signed   By: [name]  Register   On: 02/16/2017 15:57     Discharge Instructions: Discharge Instructions    Diet - low sodium heart healthy    Complete by:  As directed    Discharge instructions    Complete by:  As directed    Unfortunately you have had a stroke on the left side of your brain which has given you persistent difficulty with your speech and sensation in your right arm. This may improve some with physical and speech therapy. Please work with them as much as possible while in inpatient rehab.  It is essential that we get your diabetes, high blood pressure, and cholesterol under control. As well as avoiding smoking, alcohol, and cocaine in the future. You will have another stroke or heart attack, and potentially one that could end your life or leave you paralyzed, if you do not make these changes.  We have prescribed you several new medications to help reduce your risk of stroke in the near future. - Aspirin 325 mg daily, watch out for new signs of bleeding - Plavix 75 mg daily, " " - Atorvastatin 80 mg daily to reduce  cholesterol - Folic acid and Thiamine for nutrition - Lantus insulin 10 units daily, for high blood sugars - You will continue to need sliding scale insulin while in inpatient rehab  - We have restarted your home blood pressure medications gradually, starting with HCTZ 25 mg, and only 10 mg of your Lisinopril, but we will work toward resuming all of your blood pressure medications.   Please follow up with [place] in our IM clinic and with neurology within 6 weeks.   Increase activity slowly    Complete by:  As directed       Signed: Althia Forts, MD 02/19/2017, 11:57 AM   Pager: [phone]

## 2017-02-19 NOTE — Progress Notes (Signed)
Trish Mage, RN Rehab Admission Coordinator Signed Physical Medicine and Rehabilitation  PMR Pre-admission Date of Service: 02/19/2017 12:32 PM  Related encounter: ED to Hosp-Admission (Discharged) from 02/16/2017 in [facility] 5 [facility]       [] Hide copied text PMR Admission Coordinator Pre-Admission Assessment  Patient: Brady Church is an 56 y.o., male MRN: [id number] DOB: [birth year] Height: 5\' 8"  (172.7 cm) Weight: 97.7 kg (215 lb 4.8 oz)                                                                                                                                                  Insurance Information HMO: No    PPO:       PCP:       IPA:       80/20:       OTHER:   PRIMARY:  Medicaid [place] access      Policy#: [id number]      Subscriber:  [name] CM Name:        Phone#:       Fax#:   Pre-Cert#:        Employer:  Disabled/Unemployed Benefits:  Phone #: [phone]     Name: Automated Eff. Date: Eligible 02/19/17 with coverage code [id number]     Deduct:        Out of Pocket Max:        Life Max:   CIR:        SNF:   Outpatient:       Co-Pay:   Home Health:        Co-Pay:   DME:       Co-Pay:   Providers:    Medicaid Application Date:        Case Manager:   Disability Application Date:        Case Worker:    Emergency Contact Information        Contact Information    Name Relation Home Work Mobile   Evans,Lila Other   [phone]     Current Medical History  Patient Admitting Diagnosis:  L MCA infarct  History of Present Illness: A 55 y.o.malewith a history of DM2, CHF, polysubstance abuse, who presented on 02/16/17 with slurred speech, word finding deficits, right sided sensory changes, and staggering gait. MRI revealed an acute ischemic non-hemorrhagic posterior left MCA infarct. Cardiology following for elevated troponin and feels that the elevation is not an acute syndrome. Pt was seen by physical and occupational  therapy who found the patient to have substantial functional deficits. PM&R was consulted to assess for rehab needs.    Total: 6=NIH  Past Medical History      Past Medical History:  Diagnosis Date  . 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 Diabetes mellitus   .  High cholesterol   . Hypertension   . Substance abuse    cocaine. Stopped in 2010  . Tobacco use disorder 10/07/2014    Family History  family history includes Cancer (age of onset: 73) in his father; Coronary artery disease (age of onset: 73) in his mother; Diabetes in his maternal grandmother; Hypertension in his father.  Prior Rehab/Hospitalizations: No previous rehab  Has the patient had major surgery during 100 days prior to admission? No  Current Medications   Current Facility-Administered Medications:  .  acetaminophen (TYLENOL) tablet 650 mg, 650 mg, Oral, Q4H PRN, 650 mg at 02/18/17 1853 **OR** acetaminophen (TYLENOL) solution 650 mg, 650 mg, Per Tube, Q4H PRN **OR** acetaminophen (TYLENOL) suppository 650 mg, 650 mg, Rectal, Q4H PRN, Gwynn Burly, DO .  aspirin EC tablet 325 mg, 325 mg, Oral, Daily, Marvel Plan, MD, 325 mg at 02/19/17 0920 .  atorvastatin (LIPITOR) tablet 80 mg, 80 mg, Oral, q1800, Gwynn Burly, DO, 80 mg at 02/18/17 1733 .  brimonidine (ALPHAGAN) 0.2 % ophthalmic solution 1 drop, 1 drop, Both Eyes, BID, Althia Forts, MD, 1 drop at 02/19/17 [phone] .  clopidogrel (PLAVIX) tablet 75 mg, 75 mg, Oral, Daily, Gwynn Burly, DO, 75 mg at 02/19/17 0930 .  Difluprednate 0.05 % EMUL 1 drop, 1 drop, Both Eyes, BID, Gwynn Burly, DO, 1 drop at 02/18/17 2049 .  dorzolamide-timolol (COSOPT) 22.3-6.8 MG/ML ophthalmic solution 1 drop, 1 drop, Right Eye, BID, Gwynn Burly, DO, 1 drop at 02/19/17 0925 .  enoxaparin (LOVENOX) injection 40 mg, 40 mg, Subcutaneous, QHS, Gwynn Burly, DO, 40 mg at 02/18/17 2048 .  folic acid (FOLVITE) tablet 1 mg, 1 mg, Oral, Daily, Gwynn Burly, DO, 1 mg at 02/19/17 0920 .  hydrochlorothiazide (HYDRODIURIL) tablet 25 mg, 25 mg, Oral, Daily, Althia Forts, MD, 25 mg at 02/19/17 0920 .  insulin aspart (novoLOG) injection 0-20 Units, 0-20 Units, Subcutaneous, TID WC, John Giovanni, MD, 7 Units at 02/19/17 1220 .  insulin aspart (novoLOG) injection 0-5 Units, 0-5 Units, Subcutaneous, QHS, John Giovanni, MD, 4 Units at 02/18/17 2238 .  insulin glargine (LANTUS) injection 10 Units, 10 Units, Subcutaneous, Daily, Althia Forts, MD, 10 Units at 02/19/17 1220 .  lisinopril (PRINIVIL,ZESTRIL) tablet 10 mg, 10 mg, Oral, Daily, Althia Forts, MD, 10 mg at 02/19/17 0930 .  multivitamin with minerals tablet 1 tablet, 1 tablet, Oral, Daily, Gwynn Burly, DO, 1 tablet at 02/19/17 0920 .  prochlorperazine (COMPAZINE) tablet 5 mg, 5 mg, Oral, Q6H PRN, John Giovanni, MD, 5 mg at 02/18/17 2048 .  senna-docusate (Senokot-S) tablet 1 tablet, 1 tablet, Oral, QHS PRN, Gwynn Burly, DO .  thiamine (VITAMIN B-1) tablet 100 mg, 100 mg, Oral, Daily, 100 mg at 02/19/17 0920 **OR** [DISCONTINUED] thiamine (B-1) injection 100 mg, 100 mg, Intravenous, Daily, Gwynn Burly, DO  Patients Current Diet: Diet heart healthy/carb modified Room service appropriate? Yes; Fluid consistency: Thin Diet - low sodium heart healthy  Precautions / Restrictions Precautions Precautions: Fall Restrictions Weight Bearing Restrictions: No   Has the patient had 2 or more falls or a fall with injury in the past year?No  Prior Activity Level Community (5-7x/wk): Went out daily.  Was not driving.  Takes the bus.  Home Assistive Devices / Equipment Home Assistive Devices/Equipment: Cane (specify quad or straight) (straight)  Prior Device Use: Indicate devices/aids used by the patient prior to current illness, exacerbation or injury? Blindness cane  Prior Functional Level Prior Function Level of Independence: Independent with assistive  device(s) Comments: Pt  uses a cane to navigate environment due to blindness  Self Care: Did the patient need help bathing, dressing, using the toilet or eating?  Independent  Indoor Mobility: Did the patient need assistance with walking from room to room (with or without device)? Independent  Stairs: Did the patient need assistance with internal or external stairs (with or without device)? Independent  Functional Cognition: Did the patient need help planning regular tasks such as shopping or remembering to take medications? Independent  Current Functional Level Cognition  Arousal/Alertness: Awake/alert Overall Cognitive Status: Impaired/Different from baseline Orientation Level: Oriented to person, Oriented to place, Oriented to situation, Disoriented to person Safety/Judgement: Decreased awareness of deficits, Decreased awareness of safety General Comments: able to see results of impairments this session dropping cane down stairs due to R UE numbness Attention: Selective Selective Attention: Appears intact Memory: Appears intact Awareness: Appears intact Safety/Judgment: Appears intact    Extremity Assessment (includes Sensation/Coordination)  Upper Extremity Assessment: RUE deficits/detail RUE Deficits / Details: hand has no sensation, RUE from wrist to shoulder can sense deep pressure, able to hold arm out straight in forward flexion for 10 seconds without drift, and 4+ overall in strength, able to form grasp with 4/5 strength and unable to perform finger to thumb task RUE Sensation: decreased proprioception, decreased light touch RUE Coordination: decreased fine motor, decreased gross motor  Lower Extremity Assessment: Overall WFL for tasks assessed    ADLs  Overall ADL's : Needs assistance/impaired Eating/Feeding: Moderate assistance Grooming: Wash/dry face, Set up, Sitting Upper Body Bathing: Minimal assistance Lower Body Bathing: Moderate assistance, Cueing for  safety Upper Body Dressing : Moderate assistance Lower Body Dressing: Maximal assistance Toilet Transfer: Ambulation, Comfort height toilet, Cueing for safety, Moderate assistance (hand held assist) Toilet Transfer Details (indicate cue type and reason): simulated Toileting- Clothing Manipulation and Hygiene: Moderate assistance Tub/ Shower Transfer: Moderate assistance Functional mobility during ADLs: Minimal assistance (hand held assist)    Mobility  Overal bed mobility: Needs Assistance Bed Mobility: Supine to Sit Supine to sit: Modified independent (Device/Increase time) General bed mobility comments: use of bed rails to and vc for sequencing    Transfers  Overall transfer level: Needs assistance Equipment used: Straight cane (white cane) Transfers: Sit to/from Stand Sit to Stand: Supervision General transfer comment: for safety with cane in R hand initially    Ambulation / Gait / Stairs / Wheelchair Mobility  Ambulation/Gait Ambulation/Gait assistance: Min guard Ambulation Distance (Feet): 115 Feet (with standing breaks) Assistive device: Straight cane (white cane) Gait Pattern/deviations: Step-through pattern General Gait Details: assist for safety, stability due to limited vision, kept cane in L hand and using appropriately Gait velocity: decreased Stairs: Yes Stairs assistance: Min assist, Mod assist Stair Management: One rail Left, Alternating pattern, Forwards Number of Stairs: 10 General stair comments: assist for safety due to dropping cane out of R hand on descent and increased assist for safety during that moment    Posture / Balance Dynamic Sitting Balance Sitting balance - Comments: sitting EOB with no back support Balance Overall balance assessment: Needs assistance Sitting-balance support: Feet supported Sitting balance-Leahy Scale: Good Sitting balance - Comments: sitting EOB with no back support Standing balance support: No upper extremity  supported Standing balance-Leahy Scale: Fair Standing balance comment: initial standing holding cane in air, no LOB, but some wide stance and seems unable to take challenge Standardized Balance Assessment Standardized Balance Assessment : Dynamic Gait Index Dynamic Gait Index Level Surface: Mild Impairment Change in Gait Speed: Moderate Impairment Gait  with Horizontal Head Turns: Moderate Impairment Gait with Vertical Head Turns: Mild Impairment Gait and Pivot Turn: Mild Impairment Step Over Obstacle: Mild Impairment Step Around Obstacles: Mild Impairment Steps: Mild Impairment Total Score: 14    Special needs/care consideration BiPAP/CPAP No CPM No Continuous Drip IV No Dialysis No      Life Vest No Oxygen No Special Bed No Trach Size No Wound Vac (area) No    Skin No                            Bowel mgmt: Last BM 02/18/17 Bladder mgmt: Voiding in bathroom with assist Diabetic mgmt Yes, on oral medication at home Blind:  Is at least legallly blind and uses a walking cane for the blind.   Previous Home Environment Living Arrangements: Alone  Lives With: Alone Available Help at Discharge: Friend(s) Type of Home: Apartment (apartment is on the 3rd level.) Home Layout: One level Home Access: Stairs to enter [employer] of Steps: 17 steps to 3rd level apartment Home Care Services: No  Discharge Living Setting Plans for Discharge Living Setting: House, Lives with (comment) (Plans home with ex-girlfriend, [name].) Type of Home at Discharge: House Discharge Home Layout: One level Discharge Home Access: Stairs to enter Entrance Stairs-Number of Steps: 3 Does the patient have any problems obtaining your medications?: No  Social/Family/Support Systems Patient Roles: Other (Comment) (Has ex-girlfriend, friends, sister, brothers.) Contact Information: [name] - ex-girlfriend Anticipated Caregiver: Lila - ex-GF Anticipated Caregiver's Contact Information:  Maxie Better [phone] Ability/Limitations of Caregiver: Maxie Better is an ex-girlfriend.  Paitent was with her 8 years.  She can provide supervision after discharge. Caregiver Availability: 24/7 Discharge Plan Discussed with Primary Caregiver: Yes Is Caregiver In Agreement with Plan?: Yes Does Caregiver/Family have Issues with Lodging/Transportation while Pt is in Rehab?: No  Goals/Additional Needs Patient/Family Goal for Rehab: PT/OT/SLP mod I and supervision goals Expected length of stay: 7-10 days Cultural Considerations: Holiness Dietary Needs: Heart healthy, carb mod, thin liquids Equipment Needs: TBD Additional Information: Patient is not close to sister or brothers and did not want me to contact them.  Parents are deceased and he has no children.  He wants his contact person to be [name].  He is under Hippa restriction status so no information to be given without his permission. Pt/Family Agrees to Admission and willing to participate: Yes Program Orientation Provided & Reviewed with Pt/Caregiver Including Roles  & Responsibilities: Yes  Decrease burden of Care through IP rehab admission: [name]  Possible need for SNF placement upon discharge: Not anticipated  Patient Condition: This patient's condition remains as documented in the consult dated 02/17/17, in which the Rehabilitation Physician determined and documented that the patient's condition is appropriate for intensive rehabilitative care in an inpatient rehabilitation facility. Will admit to inpatient rehab today.  Preadmission Screen Completed By:  Trish Mage, 02/19/2017 12:40 PM ______________________________________________________________________   Discussed status with Dr. Riley Kill on 02/19/17 at 1240 and received telephone approval for admission today.  Admission Coordinator:  Trish Mage, time 1240/Date 02/19/17       Cosigned by: Ranelle Oyster, MD at 02/19/2017 12:46 PM  Revision History

## 2017-02-19 NOTE — Progress Notes (Signed)
Physical Therapy Treatment Patient Details Name: Brady Church MRN: [id number] DOB: 04-20-1961 Today's Date: 02/19/2017    History of Present Illness Pt is a 56 y.o. male with PMH of T2DM, HTN, HLD, CHF (LVEF 40-45%, G1DD, 12/2014), blindness, tobacco use, cocaine abuse who presented with altered mental status on 3/23. Pt was noted to ahve a staggering gait and slurred speech, along with difficulty "thinking of words and also forming them with his mouth. He denies difficulties with comprehension". Also noted R arm numb. 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    PT Comments    Patient progressing with ambulation and stairs this session.  Remains high fall risk per DGI scoring 14/24 (with 19 or lower demonstrating fall risk).  Patient with decreased R UE awareness as well and educated about utilizing strap on cane for safety if holding in R hand.  Continue to recommend CIR level rehab at d/c.  During session pt with phone call and unable to communicate effectively to order lunch.  Communication and safety issues impairing his ability to return to independent.   Follow Up Recommendations  CIR;Supervision for mobility/OOB     Equipment Recommendations  None recommended by PT    Recommendations for Other Services Rehab consult     Precautions / Restrictions Precautions Precautions: Fall    Mobility  Bed Mobility   Bed Mobility: Supine to Sit     Supine to sit: Modified independent (Device/Increase time)        Transfers Overall transfer level: Needs assistance Equipment used: Straight cane (white cane) Transfers: Sit to/from Stand Sit to Stand: Supervision         General transfer comment: for safety with cane in R hand initially  Ambulation/Gait Ambulation/Gait assistance: Min guard Ambulation Distance (Feet): 115 Feet (with standing breaks) Assistive device: Straight cane (white  cane) Gait Pattern/deviations: Step-through pattern Gait velocity: decreased   General Gait Details: assist for safety, stability due to limited vision, kept cane in L hand and using appropriately   Stairs Stairs: Yes   Stair Management: One rail Left;Alternating pattern;Forwards Number of Stairs: 10 General stair comments: assist for safety due to dropping cane out of R hand on descent and increased assist for safety during that moment  Wheelchair Mobility    Modified Rankin (Stroke Patients Only) Modified Rankin (Stroke Patients Only) Pre-Morbid Rankin Score: No significant disability Modified Rankin: Moderately severe disability     Balance Overall balance assessment: Needs assistance Sitting-balance support: Feet supported Sitting balance-Leahy Scale: Good     Standing balance support: No upper extremity supported Standing balance-Leahy Scale: Fair Standing balance comment: initial standing holding cane in air, no LOB, but some wide stance and seems unable to take challenge                 Standardized Balance Assessment Standardized Balance Assessment : Dynamic Gait Index   Dynamic Gait Index Level Surface: Mild Impairment Change in Gait Speed: Moderate Impairment Gait with Horizontal Head Turns: Moderate Impairment Gait with Vertical Head Turns: Mild Impairment Gait and Pivot Turn: Mild Impairment Step Over Obstacle: Mild Impairment Step Around Obstacles: Mild Impairment Steps: Mild Impairment Total Score: 14      Cognition Arousal/Alertness: Awake/alert Behavior During Therapy: WFL for tasks assessed/performed Overall Cognitive Status: Impaired/Different from baseline Area of Impairment: Safety/judgement;Awareness  Safety/Judgement: Decreased awareness of deficits;Decreased awareness of safety Awareness: Emergent   General Comments: able to see results of impairments this session dropping cane down stairs due to  R UE numbness      Exercises      General Comments General comments (skin integrity, edema, etc.): using cane for vision, able to step over and around obstacles with min verbal cues      Pertinent Vitals/Pain Pain Assessment: No/denies pain    Home Living                      Prior Function            PT Goals (current goals can now be found in the care plan section) Progress towards PT goals: Progressing toward goals    Frequency    Min 3X/week      PT Plan Current plan remains appropriate    Co-evaluation             End of Session Equipment Utilized During Treatment: Gait belt Activity Tolerance: Patient tolerated treatment well Patient left: in bed;with call bell/phone within reach;with bed alarm set   PT Visit Diagnosis: Hemiplegia and hemiparesis Hemiplegia - Right/Left: Right Hemiplegia - dominant/non-dominant: Dominant Hemiplegia - caused by: Cerebral infarction     Time: 7341-9379 PT Time Calculation (min) (ACUTE ONLY): 24 min  Charges:  $Gait Training: 23-37 mins                    G CodesSheran Church, [place] [phone] 02/19/2017    Brady Church 02/19/2017, 11:15 AM

## 2017-02-19 NOTE — Progress Notes (Signed)
Pt. Discharged to [phone]. Transported w/belongings via wheelchair.

## 2017-02-20 ENCOUNTER — Inpatient Hospital Stay (HOSPITAL_COMMUNITY): Payer: Medicaid Other | Admitting: Physical Therapy

## 2017-02-20 ENCOUNTER — Inpatient Hospital Stay (HOSPITAL_COMMUNITY): Payer: Medicaid Other | Admitting: *Deleted

## 2017-02-20 ENCOUNTER — Inpatient Hospital Stay (HOSPITAL_COMMUNITY): Payer: Medicaid Other | Admitting: Occupational Therapy

## 2017-02-20 ENCOUNTER — Inpatient Hospital Stay (HOSPITAL_COMMUNITY): Payer: Medicaid Other | Admitting: Speech Pathology

## 2017-02-20 DIAGNOSIS — I69920 Aphasia following unspecified cerebrovascular disease: Secondary | ICD-10-CM

## 2017-02-20 DIAGNOSIS — I69322 Dysarthria following cerebral infarction: Principal | ICD-10-CM

## 2017-02-20 DIAGNOSIS — G8191 Hemiplegia, unspecified affecting right dominant side: Secondary | ICD-10-CM

## 2017-02-20 LAB — COMPREHENSIVE METABOLIC PANEL
ALT: 30 U/L (ref 17–63)
AST: 48 U/L — ABNORMAL HIGH (ref 15–41)
Albumin: 3.6 g/dL (ref 3.5–5.0)
Alkaline Phosphatase: 61 U/L (ref 38–126)
Anion gap: 12 (ref 5–15)
BUN: 13 mg/dL (ref 6–20)
CALCIUM: 9.1 mg/dL (ref 8.9–10.3)
CHLORIDE: 96 mmol/L — AB (ref 101–111)
CO2: 23 mmol/L (ref 22–32)
CREATININE: 0.96 mg/dL (ref 0.61–1.24)
Glucose, Bld: 358 mg/dL — ABNORMAL HIGH (ref 65–99)
Potassium: 3.5 mmol/L (ref 3.5–5.1)
Sodium: 131 mmol/L — ABNORMAL LOW (ref 135–145)
Total Bilirubin: 0.6 mg/dL (ref 0.3–1.2)
Total Protein: 6.9 g/dL (ref 6.5–8.1)

## 2017-02-20 LAB — GLUCOSE, CAPILLARY
GLUCOSE-CAPILLARY: 305 mg/dL — AB (ref 65–99)
GLUCOSE-CAPILLARY: 243 mg/dL — AB (ref 65–99)
Glucose-Capillary: 297 mg/dL — ABNORMAL HIGH (ref 65–99)
Glucose-Capillary: 256 mg/dL — ABNORMAL HIGH (ref 65–99)

## 2017-02-20 LAB — CBC WITH DIFFERENTIAL/PLATELET
Basophils Absolute: 0 10*3/uL (ref 0.0–0.1)
Basophils Relative: 0 %
EOS PCT: 2 %
Eosinophils Absolute: 0.1 10*3/uL (ref 0.0–0.7)
HCT: 40.7 % (ref 39.0–52.0)
Hemoglobin: 14.2 g/dL (ref 13.0–17.0)
LYMPHS ABS: 2.3 10*3/uL (ref 0.7–4.0)
LYMPHS PCT: 38 %
MCH: 33.2 pg (ref 26.0–34.0)
MCHC: 34.9 g/dL (ref 30.0–36.0)
MCV: 95.1 fL (ref 78.0–100.0)
MONO ABS: 0.3 10*3/uL (ref 0.1–1.0)
MONOS PCT: 5 %
NEUTROS ABS: 3.2 10*3/uL (ref 1.7–7.7)
Neutrophils Relative %: 55 %
PLATELETS: 262 10*3/uL (ref 150–400)
RBC: 4.28 MIL/uL (ref 4.22–5.81)
RDW: 14.9 % (ref 11.5–15.5)
WBC: 5.9 10*3/uL (ref 4.0–10.5)

## 2017-02-20 MED ORDER — HYDROCHLOROTHIAZIDE 25 MG PO TABS
12.5000 mg | ORAL_TABLET | Freq: Every day | ORAL | Status: DC
  Administered 2017-02-21 – 2017-02-25 (×4): 12.5 mg via ORAL
  Filled 2017-02-20 (×5): qty 1

## 2017-02-20 MED ORDER — LISINOPRIL 5 MG PO TABS
5.0000 mg | ORAL_TABLET | Freq: Every day | ORAL | Status: DC
  Administered 2017-02-21 – 2017-02-22 (×2): 5 mg via ORAL
  Filled 2017-02-20 (×3): qty 1

## 2017-02-20 MED ORDER — LIVING WELL WITH DIABETES BOOK
Freq: Once | Status: AC
  Administered 2017-02-20: 13:00:00
  Filled 2017-02-20: qty 1

## 2017-02-20 MED ORDER — INSULIN STARTER KIT- PEN NEEDLES (ENGLISH)
1.0000 | Freq: Once | Status: AC
  Administered 2017-02-20: 1
  Filled 2017-02-20: qty 1

## 2017-02-20 NOTE — Progress Notes (Signed)
  Subjective/Complaints: Patient has decreased speech intelligibility, he has difficulty with naming and with fluency.  Review of systems cannot be obtained secondary to a aphasia and dysarthria  Objective: Vital Signs: Blood pressure 103/70, pulse 74, temperature 98.3 F (36.8 C), temperature source Oral, resp. rate 16, height 5\' 8"  (1.727 m), weight 98 kg (216 lb 1.6 oz), SpO2 99 %. No results found. Results for orders placed or performed during the hospital encounter of 02/19/17 (from the past 72 hour(s))  Glucose, capillary     Status: Abnormal   Collection Time: 02/19/17  4:24 PM  Result Value Ref Range   Glucose-Capillary 307 (H) 65 - 99 mg/dL  Glucose, capillary     Status: Abnormal   Collection Time: 02/19/17  9:19 PM  Result Value Ref Range   Glucose-Capillary 292 (H) 65 - 99 mg/dL     HEENT: normal Cardio: RRR and No murmurs Resp: CTA B/L and Unlabored GI: BS positive and Nontender and nondistended Extremity:  No Edema Skin:   Intact Neuro: Alert/Oriented, Normal Sensory, Abnormal Motor 4/5 in the right deltoid, biceps, triceps, grip, right hip flexion, knee extension and dorsiflexion5/5 in the left deltoid, biceps, triceps, grip, hip flexion, knee extension, ankle dorsiflexion, Abnormal FMC Ataxic/ dec FMC, Tone:  Increased flexor tone. Right upper extremity, Dysarthric, Aphasic, Inattention and Other Currently, impaired vision, left eye, moderately impaired vision, right eye Musc/Skel:  Other No pain with upper extremity or lower extremity active range of motion Gen. no acute distress   Assessment/Plan: 1. Functional deficits secondary to MCA distribution infarct with right hemiparesis, aphasia and dysarthria which require 3+ hours per day of interdisciplinary therapy in a comprehensive inpatient rehab setting. Physiatrist is providing close team supervision and 24 hour management of active medical problems listed below. Physiatrist and rehab team continue to assess  barriers to discharge/monitor patient progress toward functional and medical goals. FIM:                                  Medical Problem List and Plan: 1. Functional and language defiits secondary to left MCA infarct -Initiate CIR PT, OT, speech 2. DVT Prophylaxis/Anticoagulation: Pharmaceutical: Lovenox 3. Pain Management: Tylenol prn HA 4. Mood: LCSW to follow for evaluation and support.  5. Neuropsych: This patient iscapable of making decisions on hisown behalf. 6. Skin/Wound Care: Routine pressure relief measures. Maintain adequate nutritional status 7. Fluids/Electrolytes/Nutrition: Monitor I/O and BMET  8. T2DM: Poorly controlled--had stopped taking metformin weeks ago. Hgb A1C- 14.4. Was on metformin at home and now on lantus--start education on insulin pen use. Will monitor BS ac/hs. Continue lantus daily with SSI for elevated BS. 9. HTN: Monitor BP bid with permissive HTN for 5-7 days. On HCTZ and prinivil--avoid hypotension. 10. Glaucoma: On alphagan and difluperdnate,  11. Hyponatremia: Question chronic due to diuretic. Recheck in am.  12. Dyslipidemia: Now on lipitor.   LOS (Days) 1 A FACE TO FACE EVALUATION WAS PERFORMED  KIRSTEINS,ANDREW E 02/20/2017, 6:12 AM

## 2017-02-20 NOTE — Progress Notes (Signed)
Patient's BP trending low--will decrease HCTZ and prinzide and add parameters to avoid hypotension.

## 2017-02-20 NOTE — Progress Notes (Deleted)
Physical Therapy Assessment and Plan  Patient Details  Name: Brady Church MRN: [id number] Date of Birth: Oct 18, 1961  PT Diagnosis: Abnormality of gait, Coordination disorder, Difficulty walking, Hemiparesis dominant, Impaired cognition, Impaired sensation and Muscle weakness Rehab Potential:   ELOS:     Today's Date: 02/20/2017 PT Individual Time: 1100-1200 PT Individual Time Calculation (min): 60 min    Problem List:  Patient Active Problem List   Diagnosis Date Noted  . CVA (cerebral vascular accident) ([place]) 02/19/2017  . Abnormal electrocardiogram (ECG) (EKG) 02/19/2017  . Arterial ischemic stroke, MCA, left, acute ([place]) 02/19/2017  . Hyperglycemia   . Intracranial vascular stenosis 02/16/2017  . Substance abuse 02/16/2017  . Prolonged QT interval 02/16/2017  . Elevated troponin   . Slurred speech   . Cocaine use   . Chronic pancreatitis ([place]) 09/20/2016  . Chronic combined systolic and diastolic congestive heart failure ([place]) 05/12/2015  . Stable angina ([place]) 01/07/2015  . Alcohol abuse 11/06/2014  . Smoker 10/07/2014  . Preventive measure 01/23/2014  . Glaucoma 05/11/2012  . Hyperlipidemia LDL goal <100 03/22/2012  . GERD (gastroesophageal reflux disease) 01/17/2012  . Hypertension 01/17/2012  . Diabetes mellitus with diabetic cataract ([place]) 03/08/2009    Past Medical History:  Past Medical History:  Diagnosis Date  . 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 Diabetes mellitus   . High cholesterol   . Hypertension   . Substance abuse    cocaine. Stopped in 2010  . Tobacco use disorder 10/07/2014   Past Surgical History:  Past Surgical History:  Procedure Laterality Date  . CATARACT EXTRACTION  01/08/2012   Right eye  . EYE SURGERY     x 4 total     Assessment & Plan Clinical Impression:55 y.o.malewith a history of T2DM, limited vision, CHF, polysubstance abuse;who presented on 02/16/17 with slurred speech, word finding deficits,  right sided sensory changes, and staggering gait. MRI revealed an acute ischemic non-hemorrhagic posterior left MCA infarct. 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 2 D echo done revealing Ef 65-70% with moderate LVH and grade 1 diastolic dysfunction. UDS positive for cocaine. Cardiology consulted for input on elevated troponin and felt that elevation with non-diagnostic pattern and patient with chronic systolic HF--euvolemic. No further work up needed. BLE dopplers without DVT. Dr. Erlinda Hong recommends ASA and plavix for 3 months followed by Plavix alone. He was seen by PT, OT and ST revealing moderate to sever dysparthria with difficulty following multi-step commands, sensory deficits RUE affecting ability to carry out ADL tasks and substantial functional deficits. PM&R was recommended for rehab needs. Patient transferred to CIR on 02/19/2017 .   Patient currently requires min with mobility secondary to muscle weakness, impaired timing and sequencing, unbalanced muscle activation, decreased coordination and decreased motor planning, decreased visual acuity and decreased visual perceptual skills, decreased attention to right and decreased motor planning and decreased problem solving, decreased safety awareness, decreased memory and delayed processing.  Prior to hospitalization, patient was modified independent  with mobility and lived with Alone in a House home.  Home access is 3Stairs to enter.  Patient will benefit from skilled PT intervention to maximize safe functional mobility and minimize fall risk for planned discharge home with 24 hour supervision.  Anticipate patient will benefit from follow up [place] at discharge.     Skilled Therapeutic Intervention Pt seen for initial evaluation and treatment. Pt  sitting up in chair upon arrival and agreeable to PT session. Pt able to ambulate from room to small gym with min guard, using white cane.  Pt using cane more for stability than environmental scanning due to blindness in Lt eye. Pt reports that PTA he was modified independent with his mobility (using white cane) and living alone. Pt uses bus system for transportation. Upon D/C the pt is planning to stay with his ex-girlfriend who did confirm this plan. Balance activities performed during session including static and dynamic balance tasks. Cues needed for pt to consistently attend to Rt side. After finishing ambulating down steps pt attempting to walk away with Rt hand caught in rail. When ambulating back to room pt having difficulty problem solving location of room and finding room numbers even when cues provided. At this time, it is anticipated that the pt will require 24 hour supervision for mobility primarily in regard to safety concerns.    PT Evaluation Precautions/Restrictions Restrictions Weight Bearing Restrictions: No General   Vital SignsTherapy Vitals BP: 110/68 Pain Pain Assessment Pain Score: 3  Home Living/Prior Functioning   Vision/Perception     Cognition Orientation Level: Oriented to person;Oriented to place;Oriented to time;Disoriented to time   See Function Navigator for Current Functional Status.   Refer to Care Plan for Long Term Goals  Recommendations for other services: Neuropsych and Therapeutic Recreation  Kitchen group  Discharge Criteria: Patient will be discharged from PT if patient refuses treatment 3 consecutive times without medical reason, if treatment goals not met, if there is a change in medical status, if patient makes no progress towards goals or if patient is discharged from hospital.  The above assessment, treatment plan, treatment alternatives and goals were discussed and mutually agreed upon: by patient  Linard Millers, PT 02/20/2017, 12:42 PM

## 2017-02-20 NOTE — Progress Notes (Signed)
Patient information reviewed and entered into eRehab system by Emiah Pellicano, RN, CRRN, PPS Coordinator.  Information including medical coding and functional independence measure will be reviewed and updated through discharge.    

## 2017-02-20 NOTE — Evaluation (Addendum)
Occupational Therapy Assessment and Plan  Patient Details  Name: Brady Church MRN: [id number] Date of Birth: 23-Dec-1960  OT Diagnosis: abnormal posture, blindness and low vision, cognitive deficits, disturbance of vision, hemiplegia affecting dominant side and muscle weakness (generalized) Rehab Potential: Rehab Potential (ACUTE ONLY): Good ELOS: 7-10 days   Today's Date: 02/20/2017 OT Individual Time: 5003-7048 and 1430-1500 OT Total Time: 60 min and 30 min OT Individual Time Calculation (min): 30 min     Problem List:  Patient Active Problem List   Diagnosis Date Noted  . CVA (cerebral vascular accident) ([place]) 02/19/2017  . Abnormal electrocardiogram (ECG) (EKG) 02/19/2017  . Arterial ischemic stroke, MCA, left, acute ([place]) 02/19/2017  . Hyperglycemia   . Intracranial vascular stenosis 02/16/2017  . Substance abuse 02/16/2017  . Prolonged QT interval 02/16/2017  . Elevated troponin   . Slurred speech   . Cocaine use   . Chronic pancreatitis ([place]) 09/20/2016  . Chronic combined systolic and diastolic congestive heart failure ([place]) 05/12/2015  . Stable angina ([place]) 01/07/2015  . Alcohol abuse 11/06/2014  . Smoker 10/07/2014  . Preventive measure 01/23/2014  . Glaucoma 05/11/2012  . Hyperlipidemia LDL goal <100 03/22/2012  . GERD (gastroesophageal reflux disease) 01/17/2012  . Hypertension 01/17/2012  . Diabetes mellitus with diabetic cataract ([place]) 03/08/2009    Past Medical History:  Past Medical History:  Diagnosis Date  . 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 Diabetes mellitus   . High cholesterol   . Hypertension   . Substance abuse    cocaine. Stopped in 2010  . Tobacco use disorder 10/07/2014   Past Surgical History:  Past Surgical History:  Procedure Laterality Date  . CATARACT EXTRACTION  01/08/2012   Right eye  . EYE SURGERY     x 4 total     Assessment & Plan Clinical Impression: Brady Church a 56 y.o.malewith a  history of T2DM, limited vision, CHF, polysubstance abuse;who presented on 02/16/17 with slurred speech, word finding deficits, right sided sensory changes, and staggering gait. MRI revealed an acute ischemic non-hemorrhagic posterior left MCA infarct. 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 2 D echo done revealing Ef 65-70% with moderate LVH and grade 1 diastolic dysfunction. UDS positive for cocaine. Cardiology consulted for input on elevated troponin and felt that elevation with non-diagnostic pattern and patient with chronic systolic HF--euvolemic. No further work up needed. BLE dopplers without DVT. Dr. Erlinda Hong recommends ASA and plavix for 3 months followed by Plavix alone. He was seen by PT, OT and ST revealing moderate to sever dysparthria with difficulty following multi-step commands, sensory deficits RUE affecting ability to carry out ADL tasks and substantial functional deficits. PM&R was recommended for rehab needs.   Patient transferred to CIR on 02/19/2017 .    Patient currently requires min with basic self-care skills secondary to decreased cardiorespiratoy endurance, decreased coordination, decreased visual acuity, decreased visual perceptual skills and decreased visual motor skills,  and decreased standing balance, decreased postural control and decreased balance strategies.  Prior to hospitalization, patient could complete ADLs/IADLs with modified independent .  Patient will benefit from skilled intervention to decrease level of assist with basic self-care skills, increase independence with basic self-care skills and increase level of independence with iADL prior to discharge home with care partner.  Anticipate patient will require intermittent supervision and follow up outpatient.  OT - End of Session  Activity Tolerance: Tolerates 10 - 20 min activity with multiple rests Endurance Deficit: Yes Endurance Deficit  Description: Required rest breaks throughout bathing/dressing session OT Assessment Rehab Potential (ACUTE ONLY): Good Barriers to Discharge Comments: Pt reports he will live with ex-girlfriend, unable to confirm with reliable source during eval  OT Patient demonstrates impairments in the following area(s): Balance;Cognition;Perception;Endurance;Sensory;Vision;Safety;Other (Comment) (Impaired comunication due to expressive aphasia) OT Basic ADL's Functional Problem(s): Grooming;Bathing;Dressing;Toileting;Eating OT Transfers Functional Problem(s): Toilet;Tub/Shower OT Additional Impairment(s): Fuctional Use of Upper Extremity OT Plan OT Intensity: Minimum of 1-2 x/day, 45 to 90 minutes OT Frequency: 5 out of 7 days OT Duration/Estimated Length of Stay: 7-10 days OT Treatment/Interventions: Balance/vestibular training;Cognitive remediation/compensation;Discharge planning;Community reintegration;DME/adaptive equipment instruction;Functional mobility training;Neuromuscular re-education;Pain management;Psychosocial support;Patient/family education;Self Care/advanced ADL retraining;Therapeutic Activities;Therapeutic Exercise;UE/LE Strength taining/ROM;UE/LE Coordination activities;Visual/perceptual remediation/compensation OT Self Feeding Anticipated Outcome(s): Mod I OT Basic Self-Care Anticipated Outcome(s): Supervision- mod I OT Toileting Anticipated Outcome(s): Mod I OT Bathroom Transfers Anticipated Outcome(s): Supervision- mod I OT Recommendation Patient destination: Home Follow Up Recommendations: Home health OT;24 hour supervision/assistance (24 hr supervision due to communication deficits) Equipment Recommended: Tub/shower bench;To be determined   Skilled Therapeutic Intervention Session One: Pt seen for OT eval and ADL bathing/dressing session. Pt asleep in supine upon arrival, easily awoken and agreeable to tx session. He stated pain medicine given prior to tx session for heachache which  had improved.  He ambulated throughout session with min A, VCs for turning/ sequencing due to impaired vision. He bathed seated on 3-1 BSC, standing to complete aspects of bathing task with steadying assist. He dressed seated on toilet with increased time for clothing management. He attended to all body parts without cuing, however, required VCs to end task as pt perseverating on rinsing various body parts.  Grooming completed standing at sink, assist required for set-up of oral care task due to impaired depth perception and pt attempting to eat toothpaste. He returned to EOB to eat breakfast with assist to open containers- pt attenting to all sides of meal tray. He transferred to recliner at end of session, left seated in recliner with all needs in reach. Reviewed need for pt to call for assist with mobility, pt voiced understanding.   Session Two: Pt seen for OT session focusing on functional ambulation, fine motor coordination, and communication skills. Pt asleep in supine upon arrival and with a little encouragement pt agreeable to tx session. He ambulated throughout unit with min A and VCs for direction and awareness to items on R.  Attempted to completed 9 hole peg test, however, due to impaired vision and impaired sensation/ stereogenosis on R UE, unable to complete on R. Completed with L UE  2 min 37 seconds. Completed gross grasping activity with bean bags using R UE, pt required to retrieve bean bag with R UE and say color and name of food pictures on beanbag. Able to correctly identify all colors, however, required mod-max cuing for correctly naming food due to dyarthria and word finding difficulties. Asked pt hypothetical question of what he would due in case of fire in home, pt stated "run!". With min cues able to identifiy need to call fire department, however, was unable to communicate what he would say when 911 answered. Believe pt will require supervision assist at d/c due to language  deficits and safety risk factors. Pt returned to room at end of session, returned to supine and left with all needs in reach and bed alarm on.   OT Evaluation Precautions/Restrictions  Precautions Precautions:  Fall Precaution Comments: R eye blindness Restrictions Weight Bearing Restrictions: No General Chart Reviewed: Yes Additional Pertinent History: Hx R eye blindness Pain Pain Assessment Pain Assessment: No/denies pain Home Living/Prior Functioning Home Living Family/patient expects to be discharged to:: Private residence Living Arrangements: Alone Available Help at Discharge: Friend(s), Available 24 hours/day Type of Home: Apartment Home Access: Stairs to enter Technical brewer of Steps: 3 Entrance Stairs-Rails: Right Home Layout: One level Additional Comments: Above notes ex-girlfriend's home, no one present at eval to confirm  Lives With: Alone Prior Function Level of Independence: Requires assistive device for independence, Other (comment) (uses white cane for visual deficits)  Able to Take Stairs?: Reciprically Driving: No Vocation: On disability Comments: reports riding the bus system to visit friends or just go new places.  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 No caregiver present for eval to confirm PLOF  Difficulty tracking horizontally; able to track vertically Cognition Overall Cognitive Status: Impaired/Different from baseline Arousal/Alertness: Awake/alert Memory: Appears intact Attention: Selective Selective Attention: Appears intact Awareness: Appears intact Problem Solving: Impaired (Difficult  to assess given aphasia and visual deficits) Sensation Sensation Light Touch: Impaired Detail Light Touch Impaired Details: Impaired RUE Stereognosis: Impaired Detail Stereognosis Impaired Details: Impaired RUE Additional Comments: decreased sensation in Rt UE, intact in Rt LE.  Coordination Gross Motor Movements are Fluid and Coordinated: No Fine Motor Movements are Fluid and Coordinated: No Finger Nose Finger Test: Decreased accuracy B UEs, difficult to assess as pt with difficulty following directions for assessmnent 9 Hole Peg Test: Unable to complete on R due to vision and impaired sensation. L: 2 min 37 sec Motor  Motor Motor: Hemiplegia Motor - Skilled Clinical Observations: R Hemi UE>LE Trunk/Postural Assessment  Cervical Assessment Cervical Assessment: Exceptions to [facility] (Forward head) Thoracic Assessment Thoracic Assessment: Exceptions to [facility] (Kyphotic) Lumbar Assessment Lumbar Assessment: Exceptions to [facility] (Posterior pelvic tilt) Postural Control Postural Control: Within Functional Limits  Balance Balance Balance Assessed: Yes Static Sitting Balance Static Sitting - Balance Support: No upper extremity supported Static Sitting - Level of Assistance: 7: Independent Static Sitting - Comment/# of Minutes: Sitting EOB Dynamic Sitting Balance Dynamic Sitting - Balance Support: No upper extremity supported;Feet supported;During functional activity Dynamic Sitting - Level of Assistance: 6: Modified independent (Device/Increase time) Sitting balance - Comments: sitting EOB with no back support Static Standing Balance Static Standing - Balance Support: No upper extremity supported;During functional activity Static Standing - Level of Assistance: 5: Stand by assistance;4: Min assist Static Standing - Comment/# of Minutes: Standing at sink to complete grooming task Dynamic Standing Balance Dynamic Standing - Balance Support: During functional activity;No upper extremity  supported Dynamic Standing - Level of Assistance: 4: Min assist Dynamic Standing - Comments: Standing to complete bathing/dressing task Extremity/Trunk Assessment RUE Assessment RUE Assessment: Within Functional Limits (4+/5 throughout) LUE Assessment LUE Assessment: Within Functional Limits (4+/5 throughout)   See Function Navigator for Current Functional Status.   Refer to Care Plan for Long Term Goals  Recommendations for other services: None    Discharge Criteria: Patient will be discharged from OT if patient refuses treatment 3 consecutive times without medical reason, if treatment goals not met, if there is a change in medical status, if patient  makes no progress towards goals or if patient is discharged from hospital.  The above assessment, treatment plan, treatment alternatives and goals were discussed and mutually agreed upon: by patient  Ernestina Patches 02/20/2017, 3:18 PM

## 2017-02-20 NOTE — Evaluation (Signed)
Recreational Therapy Assessment and Plan  Patient Details  Name: Brady Church MRN: [id number] Date of Birth: 07/15/1961 Today's Date: 02/20/2017  Rehab Potential: Good ELOS: 2 weeks   Assessment Problem List:      Patient Active Problem List   Diagnosis Date Noted  . CVA (cerebral vascular accident) ([place]) 02/19/2017  . Abnormal electrocardiogram (ECG) (EKG) 02/19/2017  . Arterial ischemic stroke, MCA, left, acute ([place]) 02/19/2017  . Hyperglycemia   . Intracranial vascular stenosis 02/16/2017  . Substance abuse 02/16/2017  . Prolonged QT interval 02/16/2017  . Elevated troponin   . Slurred speech   . Cocaine use   . Chronic pancreatitis ([place]) 09/20/2016  . Chronic combined systolic and diastolic congestive heart failure ([place]) 05/12/2015  . Stable angina ([place]) 01/07/2015  . Alcohol abuse 11/06/2014  . Smoker 10/07/2014  . Preventive measure 01/23/2014  . Glaucoma 05/11/2012  . Hyperlipidemia LDL goal <100 03/22/2012  . GERD (gastroesophageal reflux disease) 01/17/2012  . Hypertension 01/17/2012  . Diabetes mellitus with diabetic cataract ([place]) 03/08/2009    Past Medical History:      Past Medical History:  Diagnosis Date  . 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 Diabetes mellitus   . High cholesterol   . Hypertension   . Substance abuse    cocaine. Stopped in 2010  . Tobacco use disorder 10/07/2014   Past Surgical History:       Past Surgical History:  Procedure Laterality Date  . CATARACT EXTRACTION  01/08/2012   Right eye  . EYE SURGERY     x 4 total     Assessment & Plan Clinical Impression:55 y.o.malewith a history of T2DM, limited vision, CHF, polysubstance abuse;who presented on 02/16/17 with slurred speech, word finding deficits, right sided sensory changes, and staggering gait. MRI revealed an acute ischemic non-hemorrhagic posterior left MCA infarct. CTA head/neck revealed moderate to severe stenosis  of left ICA siphon, right PCA P2 segment and L-VA at origin. 2 D echo done revealing Ef 65-70% with moderate LVH and grade 1 diastolic dysfunction. UDS positive for cocaine. Cardiology consulted for input on elevated troponin and felt that elevation with non-diagnostic pattern and patient with chronic systolic HF--euvolemic. No further work up needed. BLE dopplers without DVT. Dr. Erlinda Hong recommends ASA and plavix for 3 months followed by Plavix alone. He was seen by PT, OT and ST revealing moderate to sever dysparthria with difficulty following multi-step commands, sensory deficits RUE affecting ability to carry out ADL tasks and substantial functional deficits. PM&R was recommended for rehab needs. Patient transferred to CIR on 02/19/2017.  Pt presents with decreased activity tolerance, decreased functional mobility, decreased balance, decreased coordination, decreased vision, right inattention, decreased problem solving, decreased safety awareness, decreased memory Limiting pt's independence with leisure/community pursuits.   Leisure History/Participation Premorbid leisure interest/current participation: Games - Magazine features editor - Equities trader - Grocery store;Community - Doctor, hospital - Travel (Comment) Expression Interests: Music (Comment) Other Leisure Interests: Television;Movies;Cooking/Baking (grilling out) Leisure Participation Style: Alone;With Family/Friends Awareness of Community Resources: Good-identify 3 post discharge leisure resources Psychosocial / Spiritual Patient agreeable to Pet Therapy: Yes Does patient have pets?: No Social interaction - Mood/Behavior: Cooperative Engineer, drilling for Education?: Yes Recreational Therapy Orientation Orientation -Reviewed with patient: Available activity resources Strengths/Weaknesses Patient Strengths/Abilities: Willingness to participate;Active premorbidly Patient weaknesses: Physical limitations;Minimal  Premorbid Leisure Activity TR Patient demonstrates impairments in the following area(s): Endurance;Motor;Safety  Plan Rec Therapy Plan Is patient appropriate for Therapeutic Recreation?:  Yes Rehab Potential: Good Treatment times per week: Min 1 TR group/session >30 minutes during LOS Estimated Length of Stay: 2 weeks TR Treatment/Interventions: Adaptive equipment instruction;1:1 session;Balance/vestibular training;Functional mobility training;Community reintegration;Group participation (Comment);Patient/family education;Therapeutic activities;Recreation/leisure participation;Therapeutic exercise;UE/LE Coordination activities;Visual/perceptual remediation/compensation  Recommendations for other services: None   Discharge Criteria: Patient will be discharged from TR if patient refuses treatment 3 consecutive times without medical reason.  If treatment goals not met, if there is a change in medical status, if patient makes no progress towards goals or if patient is discharged from hospital.  The above assessment, treatment plan, treatment alternatives and goals were discussed and mutually agreed upon: by patient  [place] 02/20/2017, 2:34 PM

## 2017-02-20 NOTE — Evaluation (Signed)
Speech Language Pathology Assessment and Plan  Patient Details  Name: Brady Church MRN: [id number] Date of Birth: 29-Oct-1961  SLP Diagnosis: Aphasia;Speech and Language deficits  Rehab Potential: Good ELOS: 7 to 10 days    Today's Date: 02/20/2017 SLP Individual Time: 1300-1400 SLP Individual Time Calculation (min): 60 min   Problem List:  Patient Active Problem List   Diagnosis Date Noted  . CVA (cerebral vascular accident) ([place]) 02/19/2017  . Abnormal electrocardiogram (ECG) (EKG) 02/19/2017  . Arterial ischemic stroke, MCA, left, acute ([place]) 02/19/2017  . Hyperglycemia   . Intracranial vascular stenosis 02/16/2017  . Substance abuse 02/16/2017  . Prolonged QT interval 02/16/2017  . Elevated troponin   . Slurred speech   . Cocaine use   . Chronic pancreatitis ([place]) 09/20/2016  . Chronic combined systolic and diastolic congestive heart failure ([place]) 05/12/2015  . Stable angina ([place]) 01/07/2015  . Alcohol abuse 11/06/2014  . Smoker 10/07/2014  . Preventive measure 01/23/2014  . Glaucoma 05/11/2012  . Hyperlipidemia LDL goal <100 03/22/2012  . GERD (gastroesophageal reflux disease) 01/17/2012  . Hypertension 01/17/2012  . Diabetes mellitus with diabetic cataract ([place]) 03/08/2009   Past Medical History:  Past Medical History:  Diagnosis Date  . 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 Diabetes mellitus   . High cholesterol   . Hypertension   . Substance abuse    cocaine. Stopped in 2010  . Tobacco use disorder 10/07/2014   Past Surgical History:  Past Surgical History:  Procedure Laterality Date  . CATARACT EXTRACTION  01/08/2012   Right eye  . EYE SURGERY     x 4 total     Assessment / Plan / Recommendation Clinical Impression Brady Church a 56 y.o.malewith a history of T2DM, limited vision, CHF, polysubstance abuse;who presented on 02/16/17 with slurred speech, word finding deficits, right sided sensory changes, and  staggering gait. MRI revealed an acute ischemic non-hemorrhagic posterior left MCA infarct. 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 2 D echo done revealing Ef 65-70% with moderate LVH and grade 1 diastolic dysfunction. UDS positive for cocaine. Cardiology consulted for input on elevated troponin and felt that elevation with non-diagnostic pattern and patient with chronic systolic HF--euvolemic. No further work up needed. BLE dopplers without DVT. Dr. Erlinda Hong recommends ASA and plavix for 3 months followed by Plavix alone. He was seen by PT, OT and ST revealing moderate to sever dysparthria with difficulty following multi-step commands, sensory deficits RUE affecting ability to carry out ADL tasks and substantial functional deficits. PM&R was recommended for rehab needs. Pt admited to CIR on 02/19/17.   Cognitive linguistic evaluation provided. Pt presents with severe expressive deficits along with mild receptive deficits. Expressively, pt is ~ 25% intelligible at the word level d/t phonemic paraphasias. He is unable to express rote information without some form of sound substitutions. Additionally with phrase length utterances, pt presents with initial sound dysfluencies which may be indicative of apraxia. Given pt's expressive deficits to is difficult to determine receptive or cognitive abilities. Skilled ST is required to increase pt's expressive abilities in order ot allow pt to express wants and needs independently. Anticipate that pt will need 24 hour supervision at discharge d/t expressive deficits and follow up ST services.    Skilled Therapeutic Interventions            SLP Assessment  Patient will need skilled  [facility] during CIR admission    Recommendations  Recommendations for Other Services: Neuropsych consult Patient destination: Home Follow up Recommendations: Outpatient SLP;Home Health  SLP Equipment Recommended: To be determined    SLP Frequency 3 to 5 out of 7 days   SLP Duration  SLP Intensity  SLP Treatment/Interventions 7 to 10 days  Minumum of 1-2 x/day, 30 to 90 minutes  Functional tasks;Speech/Language facilitation;Patient/family education;Therapeutic Activities    Pain Pain Assessment Pain Assessment: No/denies pain  Prior Functioning Cognitive/Linguistic Baseline: Within functional limits Type of Home: Apartment  Lives With: Alone Available Help at Discharge: Friend(s);Available 24 hours/day Vocation: On disability  Function:    Cognition Comprehension Comprehension assist level: Follows basic conversation/direction with extra time/assistive device  Expression   Expression assist level: Expresses basic 25 - 49% of the time/requires cueing 50 - 75% of the time. Uses single words/gestures.  Social Interaction Social Interaction assist level: Interacts appropriately 75 - 89% of the time - Needs redirection for appropriate language or to initiate interaction.  Problem Solving Problem solving assist level: Solves basic problems with no assist  Memory Memory assist level: Recognizes or recalls 75 - 89% of the time/requires cueing 10 - 24% of the time   Short Term Goals: Week 1: SLP Short Term Goal 1 (Week 1): Pt will utilize speech intelligibility at the word level with Max A verbal cues to achieve to 50% intelligibility.   Refer to Care Plan for Long Term Goals  Recommendations for other services: Neuropsych  Discharge Criteria: Patient will be discharged from SLP if patient refuses treatment 3 consecutive times without medical reason, if treatment goals not met, if there is a change in medical status, if patient makes no progress towards goals or if patient is discharged from hospital.  The above assessment, treatment plan, treatment alternatives and goals were discussed and mutually agreed upon: by patient   Azadeh Hyder B. Rutherford Nail, M.S.,  [place] 02/20/2017, 5:02 PM

## 2017-02-20 NOTE — Progress Notes (Signed)
Social Work Assessment and Plan Social Work Assessment and Plan  Patient Details  Name: Brady Church MRN: [id number] Date of Birth: 1961/01/13  Today's Date: 02/20/2017  Problem List:  Patient Active Problem List   Diagnosis Date Noted  . CVA (cerebral vascular accident) ([facility]) 02/19/2017  . Abnormal electrocardiogram (ECG) (EKG) 02/19/2017  . Arterial ischemic stroke, MCA, left, acute ([facility]) 02/19/2017  . Hyperglycemia   . Intracranial vascular stenosis 02/16/2017  . Substance abuse 02/16/2017  . Prolonged QT interval 02/16/2017  . Elevated troponin   . Slurred speech   . Cocaine use   . Chronic pancreatitis ([facility]) 09/20/2016  . Chronic combined systolic and diastolic congestive heart failure ([facility]) 05/12/2015  . Stable angina ([facility]) 01/07/2015  . Alcohol abuse 11/06/2014  . Smoker 10/07/2014  . Preventive measure 01/23/2014  . Glaucoma 05/11/2012  . Hyperlipidemia LDL goal <100 03/22/2012  . GERD (gastroesophageal reflux disease) 01/17/2012  . Hypertension 01/17/2012  . Diabetes mellitus with diabetic cataract ([facility]) 03/08/2009   Past Medical History:  Past Medical History:  Diagnosis Date  . 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 Diabetes mellitus   . High cholesterol   . Hypertension   . Substance abuse    cocaine. Stopped in 2010  . Tobacco use disorder 10/07/2014   Past Surgical History:  Past Surgical History:  Procedure Laterality Date  . CATARACT EXTRACTION  01/08/2012   Right eye  . EYE SURGERY     x 4 total    Social History:  reports that he has been smoking Cigarettes.  He has been smoking about 0.50 packs per day. He has never used smokeless tobacco. He reports that he drinks alcohol. He reports that he uses drugs, including Cocaine.  Family / Support Systems Marital Status: Single Patient Roles: Other (Comment) (Siblings & ex-girlfriend) Other Supports: Lila Evans-ex-girlfriend [phone] Anticipated Caregiver:  Lila Ability/Limitations of Caregiver: Lila plans to have pt come home with her and once able to do steps better go back to his apartment Caregiver Availability: 24/7 Family Dynamics: No close with siblings doesn't want them contacted, he relies upon Lila they have always been there for one another and are best friends. Maxie Better is here and attending therapies with pt and see's how well he is doing already.  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    Abuse/Neglect Physical Abuse: Denies Verbal Abuse: Denies Sexual Abuse: Denies Exploitation of patient/patient's resources: Denies Self-Neglect: Denies  Emotional Status Pt's affect, behavior adn adjustment status: Pt is able to explain his stroke and deficits. He is doing better which is encouraging to him. He liles having Maxie Better here makes him feels supported. He feels he has a good understanding of his stroke and has no questions at this time. He has always been able to me indepedent an wants to be so again,. Recent Psychosocial Issues: other health issues-blind in left eye limited sight in right eye Pyschiatric History: No history deferred depression screen due to pt appears to be coping appropriately and able to verbalize his concerns. Would probably benefit from seeing neuro-psych while here.  Will make referral. Substance Abuse History: No issues  Patient / Family Perceptions, Expectations & Goals Pt/Family understanding of illness &  functional limitations: Pt and Lila both have spoken with the MD and feel they have a good understanding of his stroke and treatment plan. He is making good progress which is encouraging  him. Premorbid pt/family roles/activities: friend, sibling, retiree, etc Anticipated changes in roles/activities/participation: resume Pt/family expectations/goals: Pt states: " I want to be able to take care of myself, I don't want to burden anybody."  Lila states: " I will help but not forever, he will go home with me first then back to his home."  [employer]: None Premorbid Home Care/DME Agencies: Other (Comment) (has white cane due to vision) Transportation available at discharge: Lila and siblings Resource referrals recommended: Neuropsychology, Support group (specify)  Discharge Planning Living Arrangements: Alone Support Systems: Friends/neighbors, Other relatives Type of Residence: Private residence Insurance Resources: [employer] (specify county) Surveyor, quantity Resources: [employer] Screen Referred: No Living Expenses: Psychologist, sport and exercise Management: Patient Does the patient have any problems obtaining your medications?: No Home Management: Patient was doing this before stroke Patient/Family Preliminary Plans: Plans to go to Lila's home which has fewer steps then once better going back to his home. He has 17 steps into his apartment and may not be safe doing this at first. This way Maxie Better can be there with him and make sure he has what he needs. Social Work Anticipated Follow Up Needs: HH/OP, Support Group  Clinical Impression Pleasant gentleman who is encouraged by the progress he has made already since his stroke. He is apprecitative that Maxie Better is here and willing to have him stay with her until he is doing better and go back to his home. He is hoping not to be here too long and home soon. Would benefit from neuro-psych seeing will make referral to see him. Work on a safe discharge plan.  Lucy Chris 02/20/2017, 1:23 PM

## 2017-02-20 NOTE — Progress Notes (Addendum)
Inpatient Diabetes Program Recommendations  AACE/ADA: New Consensus Statement on Inpatient Glycemic Control (2015)  Target Ranges:  Prepandial:   less than 140 mg/dL      Peak postprandial:   less than 180 mg/dL (1-2 hours)      Critically ill patients:  140 - 180 mg/dL   Lab Results  Component Value Date   GLUCAP 305 (H) 02/20/2017   HGBA1C 14.4 (H) 02/17/2017    Review of Glycemic Control  Diabetes history: DM2  Outpatient Diabetes medications: Metformin 1000 mg bid  Current orders for Inpatient glycemic control: resistant correction scale Novolog 0-20 units TIDAC and 0-5 units QHS, Lantus 10 units daily  Inpatient Diabetes Program Recommendations:   Insulin - Basal: Please consider increasing to Lantus 20 units (98 Kg * 0.2 units)  Oral DM medications: Please consider starting Amaryl 2-4 mg daily.  NURSES: Please start teaching insulin pen to patient and girlfriend and allow patient to start giving self injections.  Note: Spoke with patient and girlfriend (who also has DM2) at bedside about new home insulin orders.    Education needs addressed: 1.  What an A1C and a CBG are and why important (bedside RN please reinforce). 2.  Basic pathophysiology of DM Type 2 and basic home care 3.  Goals for A1C and CBG's (when to check, how to record) 4.  Signs and symptoms of hyperglycemia and hypoglycemia and treatment 5.  Impact of nutrition, exercise, and medications on diabetes control (handout on plate method given to patient and girlfriend).   Patient's girlfriend verbalized understanding of information discussed and she states that she has no further questions at this time related to diabetes and insulin administration (able to return demonstrate insulin pen administration and insulin storage).   RNs to provide ongoing basic DM education at bedside with this patient and girlfriend and engage patient and girlfriend to actively check blood glucose and give insulin.   Patient  plans to live with girlfriend (will be primary caregiver) until able to be independent with dm home management  Note: Ordered Living Well with Diabetes book (patient unable to read, however girlfriend can read and help patient to understand material), Insulin Starter kit for [place], Consult to [employer], and patient education DM videos.  Thank you,  Windy Carina, RN, MSN Diabetes Coordinator Inpatient Diabetes Program [phone] (Team Pager)

## 2017-02-20 NOTE — Care Management Note (Signed)
Inpatient Rehabilitation Center Individual Statement of Services  Patient Name:  Brady Church  Date:  02/20/2017  Welcome to the Inpatient Rehabilitation Center.  Our goal is to provide you with an individualized program based on your diagnosis and situation, designed to meet your specific needs.  With this comprehensive rehabilitation program, you will be expected to participate in at least 3 hours of rehabilitation therapies Monday-Friday, with modified therapy programming on the weekends.  Your rehabilitation program will include the following services:  Physical Therapy (PT), Occupational Therapy (OT), Speech Therapy (ST), 24 hour per day rehabilitation nursing, Case Management (Social Worker), Rehabilitation Medicine, Nutrition Services and Pharmacy Services  Weekly team conferences will be held on Wednesday to discuss your progress.  Your Social Worker will talk with you frequently to get your input and to update you on team discussions.  Team conferences with you and your family in attendance may also be held.  Expected length of stay: 7-10 days  Overall anticipated outcome: supervision-mod/I level  Depending on your progress and recovery, your program may change. Your Social Worker will coordinate services and will keep you informed of any changes. Your Social Worker's name and contact numbers are listed  below.  The following services may also be recommended but are not provided by the Inpatient Rehabilitation Center:    Home Health Rehabiltiation Services  Outpatient Rehabilitation Services    Arrangements will be made to provide these services after discharge if needed.  Arrangements include referral to agencies that provide these services.  Your insurance has been verified to be:  Medicaid Your primary doctor is:  Karlene Lineman  Pertinent information will be shared with your doctor and your insurance company.  Social Worker:  Dossie Der, SW [phone] or ([phone]  Information discussed with and copy given to patient by: Lucy Chris, 02/20/2017, 9:26 AM

## 2017-02-20 NOTE — Plan of Care (Signed)
Problem: Food- and Nutrition-Related Knowledge Deficit (NB-1.1) Goal: Nutrition education Formal process to instruct or train a patient/client in a skill or to impart knowledge to help patients/clients voluntarily manage or modify food choices and eating behavior to maintain or improve health. Outcome: Completed/Met Date Met: 02/20/17  RD consulted for nutrition education regarding diabetes.   Lab Results  Component Value Date   HGBA1C 14.4 (H) 02/17/2017    RD provided "Carbohydrate Counting for People with Diabetes" as well as "Heart Healthy Nutrition Therapy" handout from the Academy of Nutrition and Dietetics. Discussed different food groups and their effects on blood sugar, emphasizing carbohydrate-containing foods. Provided list of carbohydrates and recommended serving sizes of common foods.  Discussed importance of controlled and consistent carbohydrate intake throughout the day. Provided examples of ways to balance meals/snacks and encouraged intake of high-fiber, whole grain complex carbohydrates. Pt reports he rarely consumes rice, pasta, and breads, but makes sure to always have a protein and vegetables at meals. Pt does report consuming cranberry juice in the mornings. Discussed recommended serving sizes of juice and discussed diabetic friendly drink options. Teach back method used.  Expect good compliance.  Body mass index is 32.86 kg/m. Pt meets criteria for class I obesity based on current BMI.  Current diet order is heart healthy/carbohydrate modified, patient is consuming approximately 85-100% of meals at this time. Labs and medications reviewed. No further nutrition interventions warranted at this time. RD contact information provided. If additional nutrition issues arise, please re-consult RD.  Brady Parker, MS, RD, LDN Pager # [phone] After hours/ weekend pager # [phone]

## 2017-02-21 ENCOUNTER — Encounter: Payer: Medicaid Other | Admitting: Internal Medicine

## 2017-02-21 ENCOUNTER — Inpatient Hospital Stay (HOSPITAL_COMMUNITY): Payer: Medicaid Other | Admitting: Physical Therapy

## 2017-02-21 ENCOUNTER — Inpatient Hospital Stay (HOSPITAL_COMMUNITY): Payer: Medicaid Other | Admitting: Occupational Therapy

## 2017-02-21 ENCOUNTER — Encounter (HOSPITAL_COMMUNITY): Payer: Medicaid Other | Admitting: Psychology

## 2017-02-21 ENCOUNTER — Inpatient Hospital Stay (HOSPITAL_COMMUNITY): Payer: Medicaid Other | Admitting: Speech Pathology

## 2017-02-21 DIAGNOSIS — F4323 Adjustment disorder with mixed anxiety and depressed mood: Secondary | ICD-10-CM

## 2017-02-21 LAB — GLUCOSE, CAPILLARY
GLUCOSE-CAPILLARY: 234 mg/dL — AB (ref 65–99)
GLUCOSE-CAPILLARY: 287 mg/dL — AB (ref 65–99)
Glucose-Capillary: 178 mg/dL — ABNORMAL HIGH (ref 65–99)

## 2017-02-21 MED ORDER — INSULIN GLARGINE 100 UNIT/ML ~~LOC~~ SOLN
15.0000 [IU] | Freq: Every day | SUBCUTANEOUS | Status: DC
  Administered 2017-02-21: 15 [IU] via SUBCUTANEOUS
  Filled 2017-02-21 (×2): qty 0.15

## 2017-02-21 NOTE — Progress Notes (Signed)
Physical Therapy Session Note  Patient Details  Name: Brady Church MRN: [id number] Date of Birth: [birth year]  Today's Date: 02/21/2017 PT Individual Time: 1500-1556 PT Individual Time Calculation (min): 56 min   Short Term Goals: Week 1:    STG =LTG due to ELOS  Skilled Therapeutic Interventions/Progress Updates:   Pt received sitting in recliner and agreeable to PT  Gait in various environments with supervision assist and 2 instances of min assist around obstacles in crowded environment. Gait training performed for 5 bouts of 214f and 741fx 2 throughout treatment. Min cues for direction due to visual deficits.   Up/down 12 steps with supervision assist overall with min assist x 1 with slight posterior LOB. Step over step pattern perform by pt, regardless of cues for step to pattern with descent.    Standing balance on airex pad x 8 minutes while performing cross body reach and BUE fine motor task to pick up bug figurines.  Min assist from PT to prevent mild posterior LOB as well as min assist with coordination of the RUE.   Nustep x 10 minutes for endurance level 4>5 with min cues for proper speed to prevent increased fatigue.      Pt returned to room and performed ambulatory transfer to bed with supervision assist . Sit>supine completed with supervision assist and min cues for safety. Pt left supine in bed with call bell in reach and all needs met.    Therapy Documentation Precautions:  Precautions Precautions: Fall Precaution Comments: R eye blindness Restrictions Weight Bearing Restrictions: No Vital Signs: Therapy Vitals Temp: 98.6 F (37 C) Temp Source: Oral Pulse Rate: 88 Resp: 18 BP: 108/66 Patient Position (if appropriate): Sitting Oxygen Therapy SpO2: 100 % O2 Device: Not Delivered Pain: 0/10   See Function Navigator for Current Functional Status.   Therapy/Group: Individual Therapy  AuLorie Phenix/28/2018, 5:47 PM

## 2017-02-21 NOTE — Progress Notes (Signed)
Subjective/Complaints: Pt feels like he has slept well, ate well  Review of systems cannot be obtained secondary to a aphasia and dysarthria  Objective: Vital Signs: Blood pressure 108/69, pulse 79, temperature 98 F (36.7 C), temperature source Oral, resp. rate 18, height 5' 8"  (1.727 m), weight 98 kg (216 lb 1.6 oz), SpO2 100 %. No results found. Results for orders placed or performed during the hospital encounter of 02/19/17 (from the past 72 hour(s))  Glucose, capillary     Status: Abnormal   Collection Time: 02/19/17  4:24 PM  Result Value Ref Range   Glucose-Capillary 307 (H) 65 - 99 mg/dL  Glucose, capillary     Status: Abnormal   Collection Time: 02/19/17  9:19 PM  Result Value Ref Range   Glucose-Capillary 292 (H) 65 - 99 mg/dL  Glucose, capillary     Status: Abnormal   Collection Time: 02/20/17  6:30 AM  Result Value Ref Range   Glucose-Capillary 305 (H) 65 - 99 mg/dL  CBC WITH DIFFERENTIAL     Status: None   Collection Time: 02/20/17 10:12 AM  Result Value Ref Range   WBC 5.9 4.0 - 10.5 K/uL   RBC 4.28 4.22 - 5.81 MIL/uL   Hemoglobin 14.2 13.0 - 17.0 g/dL   HCT 40.7 39.0 - 52.0 %   MCV 95.1 78.0 - 100.0 fL   MCH 33.2 26.0 - 34.0 pg   MCHC 34.9 30.0 - 36.0 g/dL   RDW 14.9 11.5 - 15.5 %   Platelets 262 150 - 400 K/uL   Neutrophils Relative % 55 %   Neutro Abs 3.2 1.7 - 7.7 K/uL   Lymphocytes Relative 38 %   Lymphs Abs 2.3 0.7 - 4.0 K/uL   Monocytes Relative 5 %   Monocytes Absolute 0.3 0.1 - 1.0 K/uL   Eosinophils Relative 2 %   Eosinophils Absolute 0.1 0.0 - 0.7 K/uL   Basophils Relative 0 %   Basophils Absolute 0.0 0.0 - 0.1 K/uL  Comprehensive metabolic panel     Status: Abnormal   Collection Time: 02/20/17 10:12 AM  Result Value Ref Range   Sodium 131 (L) 135 - 145 mmol/L   Potassium 3.5 3.5 - 5.1 mmol/L   Chloride 96 (L) 101 - 111 mmol/L   CO2 23 22 - 32 mmol/L   Glucose, Bld 358 (H) 65 - 99 mg/dL   BUN 13 6 - 20 mg/dL   Creatinine, Ser 0.96 0.61  - 1.24 mg/dL   Calcium 9.1 8.9 - 10.3 mg/dL   Total Protein 6.9 6.5 - 8.1 g/dL   Albumin 3.6 3.5 - 5.0 g/dL   AST 48 (H) 15 - 41 U/L   ALT 30 17 - 63 U/L   Alkaline Phosphatase 61 38 - 126 U/L   Total Bilirubin 0.6 0.3 - 1.2 mg/dL   GFR calc non Af Amer >60 >60 mL/min   GFR calc Af Amer >60 >60 mL/min    Comment: (NOTE) The eGFR has been calculated using the CKD EPI equation. This calculation has not been validated in all clinical situations. eGFR's persistently <60 mL/min signify possible Chronic Kidney Disease.    Anion gap 12 5 - 15  Glucose, capillary     Status: Abnormal   Collection Time: 02/20/17 12:08 PM  Result Value Ref Range   Glucose-Capillary 297 (H) 65 - 99 mg/dL  Glucose, capillary     Status: Abnormal   Collection Time: 02/20/17  4:55 PM  Result Value Ref Range  Glucose-Capillary 256 (H) 65 - 99 mg/dL  Glucose, capillary     Status: Abnormal   Collection Time: 02/20/17  9:27 PM  Result Value Ref Range   Glucose-Capillary 243 (H) 65 - 99 mg/dL  Glucose, capillary     Status: Abnormal   Collection Time: 02/21/17  6:32 AM  Result Value Ref Range   Glucose-Capillary 234 (H) 65 - 99 mg/dL     HEENT: normal Cardio: RRR and No murmurs Resp: CTA B/L and Unlabored GI: BS positive and Nontender and nondistended Extremity:  No Edema Skin:   Intact Neuro: Alert/Oriented, Normal Sensory, Abnormal Motor 4/5 in the right deltoid, biceps, triceps, grip, right hip flexion, knee extension and dorsiflexion5/5 in the left deltoid, biceps, triceps, grip, hip flexion, knee extension, ankle dorsiflexion, Abnormal FMC Ataxic/ dec FMC, Tone:  Increased flexor tone. Right upper extremity, Dysarthric, Aphasic, Inattention and Other Currently, impaired vision, left eye, moderately impaired vision, right eye Musc/Skel:  Other No pain with upper extremity or lower extremity active range of motion Gen. no acute distress   Assessment/Plan: 1. Functional deficits secondary to MCA  distribution infarct with right hemiparesis, aphasia and dysarthria which require 3+ hours per day of interdisciplinary therapy in a comprehensive inpatient rehab setting. Physiatrist is providing close team supervision and 24 hour management of active medical problems listed below. Physiatrist and rehab team continue to assess barriers to discharge/monitor patient progress toward functional and medical goals. FIM: Function - Bathing Position: Shower Body parts bathed by patient: Right arm, Left lower leg, Left arm, Back, Chest, Abdomen, Front perineal area, Buttocks, Right upper leg, Left upper leg, Right lower leg Assist Level: Touching or steadying assistance(Pt > 75%)  Function- Upper Body Dressing/Undressing What is the patient wearing?: Pull over shirt/dress Pull over shirt/dress - Perfomed by patient: Thread/unthread right sleeve, Thread/unthread left sleeve, Put head through opening, Pull shirt over trunk Assist Level: Touching or steadying assistance(Pt > 75%) Function - Lower Body Dressing/Undressing What is the patient wearing?: Pants, Non-skid slipper socks Position: Wheelchair/chair at sink Pants- Performed by patient: Thread/unthread right pants leg, Thread/unthread left pants leg, Pull pants up/down Non-skid slipper socks- Performed by patient: Don/doff right sock, Don/doff left sock Assist for lower body dressing: Touching or steadying assistance (Pt > 75%)  Function - Toileting Toileting steps completed by patient: Adjust clothing prior to toileting, Performs perineal hygiene, Adjust clothing after toileting Assist level: Touching or steadying assistance (Pt.75%)  Function - Toilet Transfers Assist level to toilet: Touching or steadying assistance (Pt > 75%) Assist level from toilet: Touching or steadying assistance (Pt > 75%)  Function - Chair/bed transfer Chair/bed transfer method: Ambulatory Chair/bed transfer assist level: Touching or steadying assistance (Pt >  75%) Chair/bed transfer assistive device: Cane, Armrests Chair/bed transfer details: Verbal cues for precautions/safety, Verbal cues for sequencing  Function - Locomotion: Wheelchair Will patient use wheelchair at discharge?: No Function - Locomotion: Ambulation Assistive device: Cane-straight Max distance: 250 ft Assist level: Touching or steadying assistance (Pt > 75%) Assist level: Touching or steadying assistance (Pt > 75%) Assist level: Touching or steadying assistance (Pt > 75%) Assist level: Touching or steadying assistance (Pt > 75%) Assist level: Touching or steadying assistance (Pt > 75%)  Function - Comprehension Comprehension: Auditory Comprehension assist level: Follows basic conversation/direction with extra time/assistive device  Function - Expression Expression: Verbal Expression assist level: Expresses basic 25 - 49% of the time/requires cueing 50 - 75% of the time. Uses single words/gestures.  Function - Social Interaction Social Interaction assist  level: Interacts appropriately 75 - 89% of the time - Needs redirection for appropriate language or to initiate interaction.  Function - Problem Solving Problem solving assist level: Solves basic problems with no assist  Function - Memory Memory assist level: Recognizes or recalls 75 - 89% of the time/requires cueing 10 - 24% of the time Patient normally able to recall (first 3 days only): Current season, Location of own room, Staff names and faces, That he or she is in a hospital  Medical Problem List and Plan: 1. Functional and language defiits secondary to left MCA infarct -Initiate CIR PT, OT, speech, Team conference today please see physician documentation under team conference tab, met with team face-to-face to discuss problems,progress, and goals. Formulized individual treatment plan based on medical history, underlying problem and comorbidities. 2. DVT Prophylaxis/Anticoagulation: Pharmaceutical:  Lovenox 3. Pain Management: Tylenol prn HA 4. Mood: LCSW to follow for evaluation and support.  5. Neuropsych: This patient iscapable of making decisions on hisown behalf. 6. Skin/Wound Care: Routine pressure relief measures. Maintain adequate nutritional status 7. Fluids/Electrolytes/Nutrition: Monitor I/O and BMET 85-100% meals 8. T2DM: Poorly controlled--had stopped taking metformin weeks ago. Hgb A1C- 14.4. Was on metformin at home and now on lantus--start education on insulin pen use. Will monitor BS ac/hs.increase lantus to 20u daily with SSI for elevated BS. 9. HTN: Monitor BP bid with permissive HTN for 5-7 days. On HCTZ and prinivil--avoid hypotension. 10. Glaucoma: On alphagan and difluperdnate,  11. Hyponatremia: Question chronic due to diuretic. Recheck in am.  12. Dyslipidemia: Now on lipitor.   LOS (Days) 2 A FACE TO FACE EVALUATION WAS PERFORMED  Jazlynn Nemetz E 02/21/2017, 6:54 AM

## 2017-02-21 NOTE — Progress Notes (Signed)
Social Work Patient ID: Brady Church, male   DOB: 1961-01-09, 56 y.o.   MRN: [id number]  Met with pt and girlfriend to inform team conference goals supervision level overall, for safety and needing cues. Target discharge date of 4/4. He is doing better but limited by his visual-blindness he had prior to admission. He feels in familiar environment he would do better. He is speaking better today and hopes this keeps Getting better.

## 2017-02-21 NOTE — Consult Note (Signed)
Neuropsychological Consultation   Patient:   Brady Church   DOB:   31-Jul-1961  MR Number:  [id number]  Location:  [facility] [address] [id number] [place] [address] Dept: [phone] Loc: [phone]           Date of Service:   02/21/2017  Start Time:   1 PM End Time:   2 PM  Provider/Observer:  Arley Phenix, Psy.D.       Clinical Neuropsychologist       Billing Code/Service: [phone] [id number] Units  Chief Complaint:    Frustration with loss of function on Right arm, impairment of speech and language.  Reason for Service:  The patient is a 56 year old male with presentation to ED with slurred speech, word finding deficits and right sided sensory changes.  Gait disturbance.  MRI revealed an acute non-hemorrhagic ischemic infarct of left MCA.  Below is the H&P from medical records.  Brady Church a 56 y.o.malewith a history of T2DM, limited vision, CHF, polysubstance abuse;who presented on 02/16/17 with slurred speech, word finding deficits, right sided sensory changes, and staggering gait. MRI revealed an acute ischemic non-hemorrhagic posterior left MCA infarct. 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 2 D echo done revealing Ef 65-70% with moderate LVH and grade 1 diastolic dysfunction. UDS positive for cocaine. Cardiology consulted for input on elevated troponin and felt that elevation with non-diagnostic pattern and patient with chronic systolic HF--euvolemic. No further work up needed. BLE dopplers without DVT. Dr. Roda Shutters recommends ASA and plavix for 3 months followed by Plavix alone. He was seen by PT, OT and ST revealing moderate to sever dysparthria with difficulty following multi-step commands, sensory deficits RUE affecting ability to carry out ADL tasks and substantial functional deficits. PM&R was recommended for rehab  needs.   The patient was referred for neuropsychological consultation due to increased frustration and difficulty adjusting to ongoing loss of motor function on right side of his body and impairments in speech.  These speech impairments are related to expressive language (word finding deficits and articulation errors).    Current Status:  The patient reports increased frustration but that he believes he is dealing with it adequately.  He acknowledges that it is very difficult for him to accept the deficits he is now dealing with.  Reliability of Information: Information if derived from review of medical records, consultation with treatment team and 1 hour clinical interview and brief discussion with ex-girlfriend that was with in at time of interview.  Behavioral Observation: Brady Church  presents as a 56 y.o.-year-old Right African American Male who appeared his stated age. his dress was Appropriate and he was Well Groomed and his manners were Appropriate to the situation.  his participation was indicative of Appropriate and Attentive behaviors.  There were  physical disabilities noted.  he displayed an appropriate level of cooperation and motivation.     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  He reports that he can't see out of one eye at all and the other only has about 25% viission.    Speech (Volume):  normal  Speech:   slurred; garbled  Thought Process:  Coherent  Though Content:  WNL; not suicidal  Orientation:   person, place, time/date and situation  Judgment:   Fair  Planning:   Fair  Affect:    Anxious, Tearful and stressed at time during interview, but the periods of strong emotional response were appropriate to issues being  discussed.  Mood:    Anxious and Depressed  Insight:   Fair  Intelligence:   normal  Substance Use:  There is a documented history of polysubstance abuse confirmed by patient and medical records..     Medical History:   Past Medical History:  Diagnosis Date  . 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 Diabetes mellitus   . High cholesterol   . Hypertension   . Substance abuse    cocaine. Stopped in 2010  . Tobacco use disorder 10/07/2014           Psychiatric History:  Prior polysubstance abuse primarily cocaine  Family Med/Psych History:  Family History  Problem Relation Age of Onset  . Coronary artery disease Mother 31    died of MI  . Cancer Father 73    Some GI cancer. Not sure if it was Colon Cancer or not.  . Hypertension Father   . Diabetes Maternal Grandmother     Risk of Suicide/Violence: low Patient denies any SI or HI  Impression/DX:  The patient is a 56 yo male who has been dealing with acute left MCA infarction.  He was essentially legally blind before this stroke.  He appears to be having an appropriate reaction to this acute event.  He does admit to frustration with loss of function but is ready to do all of the rehab and carry on with efforts after discharge.   Diagnosis:    Arterial ischemic stroke, MCA, left, acute     Adjustment Disorder with mix response        Electronically Signed   _______________________ Arley Phenix, Psy.D.

## 2017-02-21 NOTE — Progress Notes (Signed)
Speech Language Pathology Daily Session Note  Patient Details  Name: Brady Church MRN: [id number] Date of Birth: 09/08/1961  Today's Date: 02/21/2017   Treatment session #1 SLP Individual Time: 1430-1500 SLP Individual Time Calculation (min): 30 min   Treatment session #2 SLP Individual Time: 1430-1500 SLP Individual Time Calculation (min): 30  Short Term Goals: Week 1: SLP Short Term Goal 1 (Week 1): Pt will produce CVC words with Mod A verbal cues and 75% intelligibility.  SLP Short Term Goal 2 (Week 1): Pt will self-monitor and self- correct speech intelligibility at the word level with Min A verbal cues. SLP Short Term Goal 3 (Week 1): Pt will use word finding strategies with Min A verbal cues to name 8 items within basic categories.   Skilled Therapeutic Interventions:  Skilled treatment session #1 - focused on communication goals. SLP facilitated session by having pt write his name. Pt's writing was not legible and witting doesn't appear a viable means of communication. Pt required Total A auditory cues to produce simple CVC words with ~50% accuracy. Words were verbally spelled and sounded out. On ~50% of words, pt required individual phoneme repetition to hit target sound. SLP wrote simple CVC words on paper in very large font (covered 8 x 11 paper). Pt unable to read word. Despite expressive deficits, pt able to follow directions and sustain attention very well. Pt left upright in recliner, friend present and all needs within reach. Continue per current plan of care.   Skilled treatment session #2 - focused on communication goals. SLP facilitated session by providing Mod A verbal cues for imitation of simple CVC words presented as word families (it, at, ot, ig) to increase motoric accuracy. Pt with increased ability to repeat list of words over previous session this morning. Pt with 75% intelligibility when presented with verbal model and in word class. Pt also able to intelligibly  name 3 items in simple categories (colors, animals, counting to 10). Pt required Total A for ABCs and for stating days of the week. Pt was returned to room, left in recliner with all needs within reach. Continue per current plan of care.       Function:    Cognition Comprehension Comprehension assist level: Follows basic conversation/direction with extra time/assistive device  Expression   Expression assist level: Expresses basic 50 - 74% of the time/requires cueing 25 - 49% of the time. Needs to repeat parts of sentences.  Social Interaction Social Interaction assist level: Interacts appropriately 75 - 89% of the time - Needs redirection for appropriate language or to initiate interaction.  Problem Solving Problem solving assist level: Solves basic problems with no assist  Memory Memory assist level: Recognizes or recalls 75 - 89% of the time/requires cueing 10 - 24% of the time    Pain    Therapy/Group: Individual Therapy  Brady Church 02/21/2017, 4:03 PM

## 2017-02-21 NOTE — Patient Care Conference (Signed)
Inpatient RehabilitationTeam Conference and Plan of Care Update Date: 02/21/2017   Time: 10:25 AM    Patient Name: Brady Church      Medical Record Number: [id number]  Date of Birth: October 18, 1961 Sex: Male         Room/Bed: 4M02C/4M02C-01 Payor Info: Payor: MEDICAID [place] / Plan: MEDICAID [place] ACCESS / Product Type: *No Product type* /    Admitting Diagnosis: L CVA  Admit Date/Time:  02/19/2017  3:00 PM Admission Comments: No comment available   Primary Diagnosis:  Arterial ischemic stroke, MCA, left, acute (HCC) Principal Problem: Arterial ischemic stroke, MCA, left, acute [facility])  Patient Active Problem List   Diagnosis Date Noted  . Adjustment disorder with mixed anxiety and depressed mood   . CVA (cerebral vascular accident) (HCC) 02/19/2017  . Abnormal electrocardiogram (ECG) (EKG) 02/19/2017  . Arterial ischemic stroke, MCA, left, acute (HCC) 02/19/2017  . Hyperglycemia   . Intracranial vascular stenosis 02/16/2017  . Substance abuse 02/16/2017  . Prolonged QT interval 02/16/2017  . Elevated troponin   . Slurred speech   . Cocaine use   . Chronic pancreatitis (HCC) 09/20/2016  . Chronic combined systolic and diastolic congestive heart failure (HCC) 05/12/2015  . Stable angina (HCC) 01/07/2015  . Alcohol abuse 11/06/2014  . Smoker 10/07/2014  . Preventive measure 01/23/2014  . Glaucoma 05/11/2012  . Hyperlipidemia LDL goal <100 03/22/2012  . GERD (gastroesophageal reflux disease) 01/17/2012  . Hypertension 01/17/2012  . Diabetes mellitus with diabetic cataract (HCC) 03/08/2009    Expected Discharge Date: Expected Discharge Date: 02/28/17  Team Members Present: Physician leading conference: Arley Phenix, PsyD;Dr. Claudette Laws Social Worker Present: Dossie Der, LCSW Nurse Present: Carlean Purl, RN PT Present: Grier Rocher, PT;Rodney Wishart, PT;Rosita Dechalus, PTA OT Present: Johnsie Cancel, OT SLP Present: Reuel Derby, SLP PPS Coordinator present :  Tora Duck, RN, CRRN     Current Status/Progress Goal Weekly Team Focus  Medical   severe aphasia, apraxia, Right hemi, chronic visual impairment  express basic needs  assess communication   Bowel/Bladder   continent of bowel & bladder, LBM 3/25  continue to be continent  continue to monitor   Swallow/Nutrition/ Hydration             ADL's   Supervision- min A overall  Mod I- supervision due to language/ vision deficits  ADL re-training, visual compensation, d/c planning; R UE neuro re-ed   Mobility   Min assist with gait and transfers. Supervision assist with bed mobility.   Supervision assist overall with LRAD   Balance, endurance, coordination, fine motor control.    Communication   Max A for word level expression  Mod A at word level  Use of word finding, speech intelligibility strategies at the word level, rote information   Safety/Cognition/ Behavioral Observations            Pain   prn Tylenol ordered, no c/o pain this shift  pain scale <4  continue to monitor q shift   Skin   no skin issues  no new skin issues  continue to assess q shift      *See Care Plan and progress notes for long and short-term goals.  Barriers to Discharge: see above, visual impaiment complicating rehab    Possible Resolutions to Barriers:  cont rehab program    Discharge Planning/Teaching Needs:  Going to girlfriend's home until can return to his with multiple steps-17. She can provide supervision level      Team Discussion:  Goals  mod/I-supervision level. Cognitive word finding and poor expressive. Needs supervision for safety and cues. Cont B & B. Home with girlfriend who can provide supervision level for a short time  Revisions to Treatment Plan:  DC 4/4   Continued Need for Acute Rehabilitation Level of Care: The patient requires daily medical management by a physician with specialized training in physical medicine and rehabilitation for the following conditions: Daily direction of a  multidisciplinary physical rehabilitation program to ensure safe treatment while eliciting the highest outcome that is of practical value to the patient.: Yes Daily medical management of patient stability for increased activity during participation in an intensive rehabilitation regime.: Yes Daily analysis of laboratory values and/or radiology reports with any subsequent need for medication adjustment of medical intervention for : Neurological problems  Kashae Carstens, Lemar Livings 02/23/2017, 8:55 AM

## 2017-02-21 NOTE — Progress Notes (Signed)
Physical Therapy Session Note  Patient Details  Name: Naftuly Lyga MRN: [id number] Date of Birth: 06-05-1961  Today's Date: 02/21/2017 PT Individual Time: 1403-1430 PT Individual Time Calculation (min): 27 min   Short Term Goals: Week 1:     Skilled Therapeutic Interventions/Progress Updates: Pt presented in recliner with visitor present agreeable to therapy. Ambulated to rehab gym with min guard/close supervision. Performed obstacle course and R scanning activities in hallway. Pt able to clear obstacles (cones) 100% of time. Pt required mod cues for R visual scanning however able to find objects successfully with cues. Performed static reach and place with R hand to basketball hoop on R side to encourage R side scanning. Pt hand off to SLP at end of session.      Therapy Documentation Precautions:  Precautions Precautions: Fall Precaution Comments: R eye blindness Restrictions Weight Bearing Restrictions: No   See Function Navigator for Current Functional Status.   Therapy/Group: Individual Therapy  Shila Kruczek  Karletta Millay, PTA  02/21/2017, 2:54 PM

## 2017-02-21 NOTE — Progress Notes (Signed)
Occupational Therapy Session Note  Patient Details  Name: Brady Church MRN: [id number] Date of Birth: [birth year]  Today's Date: 02/21/2017 OT Individual Time: 1886-7737 OT Individual Time Calculation (min): 60 min    Short Term Goals: Week 1:  OT Short Term Goal 1 (Week 1): STG=LTG due to LOS  Skilled Therapeutic Interventions/Progress Updates:    Pt seen for OT ADL bathing/dressing session. Pt sitting EOB upon arrival eating breakfast, denying pain and agreeable to tx session.  He was self feeding with non-dominant L UE. Encouraged pt to attempt self feeding with dominant R UE. When using R UE, pt displayed decreased perceptual abilities, unable to successfully scoop food onto fork and unaware. He also demonstrated decreased abilitity to maintain functional grasp onto utensil, however, unaware when no longer holding utensil. Attempted use of built up gripper, however, did not appear to make a difference. He ambulated throughout session with close supervision using walking stick. He completed toilet transfer and 3/3 toileting task with supervision. He bathed seate don 3-1 BSC, requiring VCs throughout for sequencing,  Some perseveration noted with bathing task as well.  He dressed seated on toilet with increased time. Grooming completed standing at sink. He ambulated to therapy gym with supervision. Completed clothes pin tree utilizing R and L UE. Pt again displayed decreased perceptual abilities when using R UE to place clothes pin compared to ability to place with L UE. Pt with dymetria to R/L when attempting to use R UE to place clothes pin. Pt returned to room at end of session in same manner as described above, able to navigate to room without cuing. He was left seated in recliner at end of session, all needs in reach.   Therapy Documentation Precautions:  Precautions Precautions: Fall Precaution Comments: R eye blindness Restrictions Weight Bearing Restrictions: No Pain:   No/  denies pain  See Function Navigator for Current Functional Status.   Therapy/Group: Individual Therapy  Lewis, Ikia Cincotta C 02/21/2017, 7:10 AM

## 2017-02-21 NOTE — Evaluation (Addendum)
Physical Therapy Assessment and Plan  Patient Details  Name: Brady Church MRN: [id number] Date of Birth: 1961-10-18  PT Diagnosis: Abnormality of gait, Coordination disorder, Difficulty walking, Hemiparesis dominant, Impaired cognition, Impaired sensation and Muscle weakness Rehab Potential: Excellent ELOS: 7-10 days   Today's Date: 02/20/2017 PT Individual Time: 1100-1200 PT Individual Time Calculation (min): 27 min    Problem List:  Patient Active Problem List   Diagnosis Date Noted  . Adjustment disorder with mixed anxiety and depressed mood   . CVA (cerebral vascular accident) ([place]) 02/19/2017  . Abnormal electrocardiogram (ECG) (EKG) 02/19/2017  . Arterial ischemic stroke, MCA, left, acute ([place]) 02/19/2017  . Hyperglycemia   . Intracranial vascular stenosis 02/16/2017  . Substance abuse 02/16/2017  . Prolonged QT interval 02/16/2017  . Elevated troponin   . Slurred speech   . Cocaine use   . Chronic pancreatitis ([place]) 09/20/2016  . Chronic combined systolic and diastolic congestive heart failure ([place]) 05/12/2015  . Stable angina ([place]) 01/07/2015  . Alcohol abuse 11/06/2014  . Smoker 10/07/2014  . Preventive measure 01/23/2014  . Glaucoma 05/11/2012  . Hyperlipidemia LDL goal <100 03/22/2012  . GERD (gastroesophageal reflux disease) 01/17/2012  . Hypertension 01/17/2012  . Diabetes mellitus with diabetic cataract ([place]) 03/08/2009    Past Medical History:  Past Medical History:  Diagnosis Date  . 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 Diabetes mellitus   . High cholesterol   . Hypertension   . Substance abuse    cocaine. Stopped in 2010  . Tobacco use disorder 10/07/2014   Past Surgical History:  Past Surgical History:  Procedure Laterality Date  . CATARACT EXTRACTION  01/08/2012   Right eye  . EYE SURGERY     x 4 total     Assessment & Plan Clinical Impression: 56 y.o.malewith a history of T2DM, limited vision, CHF,  polysubstance abuse;who presented on 02/16/17 with slurred speech, word finding deficits, right sided sensory changes, and staggering gait. MRI revealed an acute ischemic non-hemorrhagic posterior left MCA infarct. 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 2 D echo done revealing Ef 65-70% with moderate LVH and grade 1 diastolic dysfunction. UDS positive for cocaine. Cardiology consulted for input on elevated troponin and felt that elevation with non-diagnostic pattern and patient with chronic systolic HF--euvolemic. No further work up needed. BLE dopplers without DVT. Dr. Erlinda Hong recommends ASA and plavix for 3 months followed by Plavix alone. He was seen by PT, OT and ST revealing moderate to sever dysparthria with difficulty following multi-step commands, sensory deficits RUE affecting ability to carry out ADL tasks and substantial functional deficits. PM&R was recommended for rehab needs. Patient transferred to CIR on 02/19/2017 . Marland Kitchen  Patient transferred to CIR on 02/19/2017 .   Patient currently requires min with mobility secondary to muscle weakness, impaired timing and sequencing, unbalanced muscle activation, decreased coordination and decreased motor planning, decreased visual acuity and decreased attention, decreased awareness, decreased problem solving and decreased safety awareness.  Prior to hospitalization, patient was modified independent  with mobility and lived with Alone in a House home.  Home access is 3Stairs to enter.  Patient will benefit from skilled PT intervention to maximize safe functional mobility and minimize fall risk for planned discharge home with 24 hour supervision.  Anticipate patient will benefit from follow up [place] at discharge.  PT - End of Session Activity Tolerance:  Tolerates 30+ min activity with multiple rests PT Assessment Rehab Potential (ACUTE/IP ONLY): Excellent PT Patient demonstrates impairments in  the following area(s): Balance;Endurance;Motor;Sensory;Safety;Perception PT Transfers Functional Problem(s): Bed to Chair;Car;Furniture;Floor;Bed Mobility PT Locomotion Functional Problem(s): Ambulation;Stairs PT Plan PT Intensity: Minimum of 1-2 x/day ,45 to 90 minutes PT Frequency: 5 out of 7 days PT Duration Estimated Length of Stay: 7-10 days PT Treatment/Interventions: Ambulation/gait training;Balance/vestibular training;Cognitive remediation/compensation;Community reintegration;Discharge planning;Disease management/prevention;DME/adaptive equipment instruction;Functional mobility training;Neuromuscular re-education;Patient/family education;Psychosocial support;Stair training;Therapeutic Activities;Therapeutic Exercise;UE/LE Strength taining/ROM;UE/LE Coordination activities;Visual/perceptual remediation/compensation PT Transfers Anticipated Outcome(s): supervision PT Locomotion Anticipated Outcome(s):supervision PT Recommendation Recommendations for Other Services: Speech consult;Neuropsych consult;Therapeutic Recreation consult Therapeutic Recreation Interventions: Kitchen group Follow Up Recommendations: Home health PT;24 hour supervision/assistance Patient destination:  (ex-girlfriend's home) Equipment Recommended: None recommended by PT Equipment Details: pt using white cane for vision deficits  Skilled Therapeutic Intervention Pt seen for initial evaluation and treatment. Pt sitting up in chair upon arrival and agreeable to PT session. Pt able to ambulate from room to small gym with min guard, using white cane. Pt using cane more for stability than environmental scanning due to blindness in Lt eye. Pt reports that PTA he was modified independent with his mobility (using white cane) and living alone. Pt uses bus system for transportation. Upon D/C the pt is planning to stay with his ex-girlfriend who did confirm this plan. Balance activities performed during session including static and  dynamic balance tasks. Cues needed for pt to consistently attend to Rt side. After finishing ambulating down steps pt attempting to walk away with Rt hand caught in rail. When ambulating back to room pt having difficulty problem solving location of room and finding room numbers even when cues provided. At this time, it is anticipated that the pt will require 24 hour supervision for mobility primarily in regard to safety concerns.    PT Evaluation Precautions/Restrictions Precautions Precautions: Fall Restrictions Weight Bearing Restrictions: No   Home Living/Prior Functioning Home Living Available Help at Discharge: Friend(s);Available 24 hours/day (will stay with ex-girlfriend) Type of Home: House Home Access: Stairs to enter Entrance Stairs-Number of Steps: 3 Entrance Stairs-Rails: Right Home Layout: One level Additional Comments: Above notes ex-girlfriend's home. She was present and confirmed the above.   Lives With: Alone Prior Function Level of Independence: Requires assistive device for independence;Other (comment) (uses white cane for visual deficits)  Able to Take Stairs?: Reciprically Driving: No Comments: reports riding the bus system to visit friends or just go new places.  Vision/Perception  Vision - History Baseline Vision: Other (comment) (blind in Lt eye) Vision - Assessment Additional Comments: blind Lt eye  Cognition Overall Cognitive Status: Impaired/Different from baseline Arousal/Alertness: Awake/alert Orientation Level: Oriented to person;Oriented to place;Oriented to time;Disoriented to time Attention: Selective Selective Attention: Appears intact Memory: Appears intact Awareness: Appears intact Problem Solving: Impaired Safety/Judgment: Appears intact Sensation Sensation Additional Comments: decreased sensation in Rt UE, intact in Rt LE.  Coordination Gross Motor Movements are Fluid and Coordinated: No (deficits noted in Rt UE. ) Fine Motor  Movements are Fluid and Coordinated: No Heel Shin Test: decreased accuracy with Rt LE, decreased alternating toe tap coordination on Rt.  Motor  Motor Motor:  (decreased strenght/coordination in Rt UE. )      Balance Berg Balance Test Sit to Stand: Able to stand  independently using hands Standing Unsupported: Able to stand 2 minutes with supervision Sitting with Back Unsupported but Feet Supported on Floor or Stool: Able to sit safely and securely 2 minutes Stand to Sit:  Sits safely with minimal use of hands Transfers: Able to transfer safely, minor use of hands Standing Unsupported with Eyes Closed: Able to stand 10 seconds with supervision Standing Ubsupported with Feet Together: Able to place feet together independently and stand for 1 minute with supervision From Standing, Reach Forward with Outstretched Arm: Can reach forward >12 cm safely (5") From Standing Position, Pick up Object from Floor: Able to pick up shoe, needs supervision From Standing Position, Turn to Look Behind Over each Shoulder: Looks behind from both sides and weight shifts well Turn 360 Degrees: Able to turn 360 degrees safely but slowly Standing Unsupported, Alternately Place Feet on Step/Stool: Able to complete >2 steps/needs minimal assist Standing Unsupported, One Foot in Front: Able to take small step independently and hold 30 seconds Standing on One Leg: Tries to lift leg/unable to hold 3 seconds but remains standing independently Total Score: 40 Static Sitting Balance Static Sitting - Balance Support: No upper extremity supported Static Sitting - Level of Assistance: 7: Independent Dynamic Sitting Balance Dynamic Sitting - Balance Support: No upper extremity supported Dynamic Sitting - Level of Assistance: 7: Independent Static Standing Balance Static Standing - Balance Support: No upper extremity supported Static Standing - Level of Assistance: 5: Stand by assistance Static Stance: Eyes Closed:  increased sway, no gross loss of balance Dynamic Standing Balance Dynamic Standing - Balance Support: No upper extremity supported Dynamic Standing - Level of Assistance: 4: Min assist Dynamic Standing - Balance Activities: Reaching for objects Extremity Assessment      RLE Strength RLE Overall Strength Comments: Dorsiflexion, knee extension 5/5, hip flexion 4+/5, hip abduction/adduction 5/5.  LLE Assessment LLE Assessment:  (grossly 5/5 throughout)   See Function Navigator for Current Functional Status.   Refer to Care Plan for Long Term Goals  Recommendations for other services: Neuropsych and Therapeutic Recreation  Kitchen group  Discharge Criteria: Patient will be discharged from PT if patient refuses treatment 3 consecutive times without medical reason, if treatment goals not met, if there is a change in medical status, if patient makes no progress towards goals or if patient is discharged from hospital.  The above assessment, treatment plan, treatment alternatives and goals were discussed and mutually agreed upon: by patient  Linard Millers, PT 02/21/2017, 4:31 PM

## 2017-02-22 ENCOUNTER — Inpatient Hospital Stay (HOSPITAL_COMMUNITY): Payer: Medicaid Other | Admitting: Speech Pathology

## 2017-02-22 ENCOUNTER — Inpatient Hospital Stay (HOSPITAL_COMMUNITY): Payer: Medicaid Other | Admitting: Occupational Therapy

## 2017-02-22 ENCOUNTER — Inpatient Hospital Stay (HOSPITAL_COMMUNITY): Payer: Medicaid Other | Admitting: Physical Therapy

## 2017-02-22 DIAGNOSIS — F4323 Adjustment disorder with mixed anxiety and depressed mood: Secondary | ICD-10-CM

## 2017-02-22 LAB — GLUCOSE, CAPILLARY
GLUCOSE-CAPILLARY: 152 mg/dL — AB (ref 65–99)
GLUCOSE-CAPILLARY: 94 mg/dL (ref 65–99)
Glucose-Capillary: 287 mg/dL — ABNORMAL HIGH (ref 65–99)
Glucose-Capillary: 253 mg/dL — ABNORMAL HIGH (ref 65–99)
Glucose-Capillary: 325 mg/dL — ABNORMAL HIGH (ref 65–99)

## 2017-02-22 MED ORDER — INSULIN GLARGINE 100 UNIT/ML ~~LOC~~ SOLN
25.0000 [IU] | Freq: Every day | SUBCUTANEOUS | Status: DC
  Administered 2017-02-22 – 2017-02-27 (×6): 25 [IU] via SUBCUTANEOUS
  Filled 2017-02-22 (×6): qty 0.25

## 2017-02-22 MED ORDER — INSULIN GLARGINE 100 UNIT/ML ~~LOC~~ SOLN
25.0000 [IU] | Freq: Every day | SUBCUTANEOUS | Status: DC
  Filled 2017-02-22: qty 0.25

## 2017-02-22 NOTE — IPOC Note (Signed)
Overall Plan of Care [facility]) Patient Details Name: Brady Church MRN: [id number] DOB: [birth year]  Admitting Diagnosis: L CVA  Hospital Problems: Principal Problem:   Arterial ischemic stroke, MCA, left, acute (HCC) Active Problems:   Adjustment disorder with mixed anxiety and depressed mood     Functional Problem List: Nursing Endurance, Safety  PT Balance, Endurance, Motor, Sensory, Safety, Perception  OT Balance, Cognition, Perception, Endurance, Sensory, Vision, Safety, Other (Comment) (Impaired comunication due to expressive aphasia)  SLP Linguistic  TR Endurance, Motor, Safety       Basic ADL's: OT Grooming, Bathing, Dressing, Toileting, Eating     Advanced  ADL's: OT       Transfers: PT Bed to Chair, Car, Furniture, [employer], [employer], Tub/Shower     Locomotion: PT Ambulation, Stairs     Additional Impairments: OT Fuctional Use of Upper Extremity  SLP Communication expression    TR      Anticipated Outcomes Item Anticipated Outcome  Self Feeding Mod I  Swallowing      Basic self-care  Supervision- mod I  Toileting  Mod I   Bathroom Transfers Supervision- mod I  Bowel/Bladder  Continent to bowel and bladder with min. assisst.  Transfers  modified independent  Locomotion  modified independent (physical aspect)  Communication  Mod A at the phrase level during structured activities  Cognition     Pain  Less than 3,on 1 to 10  Safety/Judgment  Free from falls during his stay in rehab.   Therapy Plan: PT Intensity: Minimum of 1-2 x/day ,45 to 90 minutes PT Frequency: 5 out of 7 days PT Duration Estimated Length of Stay: 7-10 days OT Intensity: Minimum of 1-2 x/day, 45 to 90 minutes OT Frequency: 5 out of 7 days OT Duration/Estimated Length of Stay: 7-10 days SLP Intensity: Minumum of 1-2 x/day, 30 to 90 minutes SLP Frequency: 3 to 5 out of 7 days SLP Duration/Estimated Length of Stay: 7 to 10 days       Team  Interventions: Nursing Interventions Patient/Family Education, Discharge Planning  PT interventions Ambulation/gait training, Warden/ranger, Cognitive remediation/compensation, Firefighter, Discharge planning, Disease management/prevention, Fish farm manager, Functional mobility training, Neuromuscular re-education, Patient/family education, Psychosocial support, Stair training, Therapeutic Activities, Therapeutic Exercise, UE/LE Strength taining/ROM, UE/LE Coordination activities, Visual/perceptual remediation/compensation  OT Interventions Warden/ranger, Cognitive remediation/compensation, Discharge planning, Community reintegration, DME/adaptive equipment instruction, Functional mobility training, Neuromuscular re-education, Pain management, Psychosocial support, Patient/family education, Self Care/advanced ADL retraining, Therapeutic Activities, Therapeutic Exercise, UE/LE Strength taining/ROM, UE/LE Coordination activities, Visual/perceptual remediation/compensation  SLP Interventions Functional tasks, Speech/Language facilitation, Patient/family education, Therapeutic Activities  TR Interventions Adaptive equipment instruction, 1:1 session, Warden/ranger, Functional mobility training, Community reintegration, Group participation (Comment), Patient/family education, Therapeutic activities, Recreation/leisure participation, Therapeutic exercise, UE/LE Coordination activities, Visual/perceptual remediation/compensation  SW/CM Interventions Discharge Planning, Psychosocial Support, Patient/Family Education    Team Discharge Planning: Destination: PT- (ex-girlfriend's home) ,OT- Home , SLP-Home Projected Follow-up: PT-Home health PT, 24 hour supervision/assistance, OT-  Home health OT, 24 hour supervision/assistance (24 hr supervision due to communication deficits), SLP-Outpatient SLP, Home Health SLP Projected Equipment Needs:  PT-None recommended by PT, OT- Tub/shower bench, To be determined, SLP-To be determined Equipment Details: PT-pt using white cane for vision deficits, OT-  Patient/family involved in discharge planning: PT- Patient,  OT-Patient, SLP-Patient  MD ELOS: 7-8d Medical Rehab Prognosis:  Excellent Assessment:  55 y.o.malewith a history of T2DM, limited vision, CHF, polysubstance abuse;who presented on 02/16/17 with slurred speech, word finding deficits, right sided  sensory changes, and staggering gait. MRI revealed an acute ischemic non-hemorrhagic posterior left MCA infarct. 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 2 D echo done revealing Ef 65-70% with moderate LVH and grade 1 diastolic dysfunction. UDS positive for cocaine. Cardiology consulted for input on elevated troponin and felt that elevation with non-diagnostic pattern and patient with chronic systolic HF--euvolemic. No further work up needed. BLE dopplers without DVT. Dr. Roda Shutters recommends ASA and plavix for 3 months followed by Plavix alone.   Now requiring 24/7 Rehab RN,MD, as well as CIR level PT, OT and SLP.  Treatment team will focus on ADLs and mobility with goals set at Mod I See Team Conference Notes for weekly updates to the plan of care

## 2017-02-22 NOTE — Progress Notes (Signed)
Bag with pt belongings found and had medications from home.  Suszanne Conners (girlfriend) took medications back home.

## 2017-02-22 NOTE — Progress Notes (Signed)
Speech Language Pathology Daily Session Note  Patient Details  Name: Brady Church MRN: [id number] Date of Birth: [birth year]  Today's Date: 02/22/2017   Treatment session #1 SLP Individual Time: 0830-0900 SLP Individual Time Calculation (min): 30 min   Treatment session #2 SLP Individual Time: 2194-7125 SLP Individual Time Calculation (min): 30 min  Short Term Goals: Week 1: SLP Short Term Goal 1 (Week 1): Pt will produce CVC words with Mod A verbal cues and 75% intelligibility.  SLP Short Term Goal 2 (Week 1): Pt will self-monitor and self- correct speech intelligibility at the word level with Min A verbal cues. SLP Short Term Goal 3 (Week 1): Pt will use word finding strategies with Min A verbal cues to name 8 items within basic categories.   Skilled Therapeutic Interventions:  Skilled treatment session #1 focused on expressive communication goals. SLP facilitated session by providing verbal model for list of 20 words (presented within family groups). Pt able to imitate words with 100% accuracy this session. Pt able to nmae 5 objects within 10 groups with Min A verbal cues. Pt with increased expressive abilities this session and able to self-monitor and attempt to self-correct with Min A. Pt was returned to room, left in recliner and all needs within reach.   Skilled treatment session #2 focused on expressive communication goals. SLP facilitated session by providing Min A verbal cues for functional communication at the phrase level when answering questions regarding TV program tha the was watching. Put able to effectively communicate using phrases with >90% ability and Min questions cues for unknown information. Pt will increased expressive abilities today. Pt was left in his recliner with all needs within reach. Continue per current plan of care.      Function:    Cognition Comprehension Comprehension assist level: Follows basic conversation/direction with extra time/assistive  device  Expression   Expression assist level: Expresses basic 50 - 74% of the time/requires cueing 25 - 49% of the time. Needs to repeat parts of sentences.  Social Interaction Social Interaction assist level: Interacts appropriately 90% of the time - Needs monitoring or encouragement for participation or interaction.  Problem Solving Problem solving assist level: Solves basic problems with no assist  Memory Memory assist level: Recognizes or recalls 90% of the time/requires cueing < 10% of the time    Pain    Therapy/Group: Individual Therapy  Kenneshia Rehm 02/22/2017, 3:22 PM

## 2017-02-22 NOTE — Progress Notes (Signed)
Occupational Therapy Session Note  Patient Details  Name: Brady Church MRN: [id number] Date of Birth: 1961/03/14  Today's Date: 02/22/2017 OT Individual Time: 3729-0211 OT Individual Time Calculation (min): 56 min    Short Term Goals: Week 1:  OT Short Term Goal 1 (Week 1): STG=LTG due to LOS  Skilled Therapeutic Interventions/Progress Updates:    Pt seen for OT ADL bathing/dressing session. Pt in supine upon arrival, easily awoken and agreeable to tx session. He ambulated throughout session with supervision occasional VCs for path finding.  He bathed seated on shower chair, standing with use of grab bads to complete pericare/ buttock hygiene. He dressed seated on toilet with increased time. He ambulated to ADL apartment and completed tub/shower transfer utilizing tub transfer bench with supervision and max verbal and tactile cuing for proper technique/ sequencing.  In therapy gym, attempted to have pt complete pipe tree activity, however, due to impaired vision and impaired sensation in R UE, pt unable to successfully place pieces together. Completed steroegenosis activity, pt unable to identfy self care items when placed in right hand and also demonstrated decreased in-hand manipulation abilities when attempted to turn item within hand. Pt stated he could not feel object in hand and would drop items without awareness. Pt ambulated back to room at end of session, left seated in recliner with QRB donned and hand off to SLP.   Therapy Documentation Precautions:  Precautions Precautions: Fall Precaution Comments: R eye blindness Restrictions Weight Bearing Restrictions: No Pain:   No/ denies pain  See Function Navigator for Current Functional Status.   Therapy/Group: Individual Therapy  Lewis, Nakita San[place]re C 02/22/2017, 7:06 AM

## 2017-02-22 NOTE — Progress Notes (Signed)
Subjective/Complaints: Pt without new issues overnite, discussed elevated CBG pt states he took pills at home  Review of systems cannot be obtained secondary to a aphasia and dysarthria  Objective: Vital Signs: Blood pressure 121/75, pulse 79, temperature 97.8 F (36.6 C), temperature source Oral, resp. rate 16, height 5\' 8"  (1.727 m), weight 98 kg (216 lb 1.6 oz), SpO2 99 %. No results found. Results for orders placed or performed during the hospital encounter of 02/19/17 (from the past 72 hour(s))  Glucose, capillary     Status: Abnormal   Collection Time: 02/19/17  4:24 PM  Result Value Ref Range   Glucose-Capillary 307 (H) 65 - 99 mg/dL  Glucose, capillary     Status: Abnormal   Collection Time: 02/19/17  9:19 PM  Result Value Ref Range   Glucose-Capillary 292 (H) 65 - 99 mg/dL  Glucose, capillary     Status: Abnormal   Collection Time: 02/20/17  6:30 AM  Result Value Ref Range   Glucose-Capillary 305 (H) 65 - 99 mg/dL  CBC WITH DIFFERENTIAL     Status: None   Collection Time: 02/20/17 10:12 AM  Result Value Ref Range   WBC 5.9 4.0 - 10.5 K/uL   RBC 4.28 4.22 - 5.81 MIL/uL   Hemoglobin 14.2 13.0 - 17.0 g/dL   HCT 02/22/17 30.5 - 28.1 %   MCV 95.1 78.0 - 100.0 fL   MCH 33.2 26.0 - 34.0 pg   MCHC 34.9 30.0 - 36.0 g/dL   RDW 88.5 77.1 - 10.7 %   Platelets 262 150 - 400 K/uL   Neutrophils Relative % 55 %   Neutro Abs 3.2 1.7 - 7.7 K/uL   Lymphocytes Relative 38 %   Lymphs Abs 2.3 0.7 - 4.0 K/uL   Monocytes Relative 5 %   Monocytes Absolute 0.3 0.1 - 1.0 K/uL   Eosinophils Relative 2 %   Eosinophils Absolute 0.1 0.0 - 0.7 K/uL   Basophils Relative 0 %   Basophils Absolute 0.0 0.0 - 0.1 K/uL  Comprehensive metabolic panel     Status: Abnormal   Collection Time: 02/20/17 10:12 AM  Result Value Ref Range   Sodium 131 (L) 135 - 145 mmol/L   Potassium 3.5 3.5 - 5.1 mmol/L   Chloride 96 (L) 101 - 111 mmol/L   CO2 23 22 - 32 mmol/L   Glucose, Bld 358 (H) 65 - 99 mg/dL   BUN  13 6 - 20 mg/dL   Creatinine, Ser 02/22/17 0.61 - 1.24 mg/dL   Calcium 9.1 8.9 - 7.63 mg/dL   Total Protein 6.9 6.5 - 8.1 g/dL   Albumin 3.6 3.5 - 5.0 g/dL   AST 48 (H) 15 - 41 U/L   ALT 30 17 - 63 U/L   Alkaline Phosphatase 61 38 - 126 U/L   Total Bilirubin 0.6 0.3 - 1.2 mg/dL   GFR calc non Af Amer >60 >60 mL/min   GFR calc Af Amer >60 >60 mL/min    Comment: (NOTE) The eGFR has been calculated using the CKD EPI equation. This calculation has not been validated in all clinical situations. eGFR's persistently <60 mL/min signify possible Chronic Kidney Disease.    Anion gap 12 5 - 15  Glucose, capillary     Status: Abnormal   Collection Time: 02/20/17 12:08 PM  Result Value Ref Range   Glucose-Capillary 297 (H) 65 - 99 mg/dL  Glucose, capillary     Status: Abnormal   Collection Time: 02/20/17  4:55 PM  Result Value Ref Range   Glucose-Capillary 256 (H) 65 - 99 mg/dL  Glucose, capillary     Status: Abnormal   Collection Time: 02/20/17  9:27 PM  Result Value Ref Range   Glucose-Capillary 243 (H) 65 - 99 mg/dL  Glucose, capillary     Status: Abnormal   Collection Time: 02/21/17  6:32 AM  Result Value Ref Range   Glucose-Capillary 234 (H) 65 - 99 mg/dL  Glucose, capillary     Status: Abnormal   Collection Time: 02/21/17 11:35 AM  Result Value Ref Range   Glucose-Capillary 287 (H) 65 - 99 mg/dL  Glucose, capillary     Status: Abnormal   Collection Time: 02/21/17  4:55 PM  Result Value Ref Range   Glucose-Capillary 178 (H) 65 - 99 mg/dL  Glucose, capillary     Status: Abnormal   Collection Time: 02/21/17  8:20 PM  Result Value Ref Range   Glucose-Capillary 287 (H) 65 - 99 mg/dL  Glucose, capillary     Status: Abnormal   Collection Time: 02/22/17  6:58 AM  Result Value Ref Range   Glucose-Capillary 253 (H) 65 - 99 mg/dL     HEENT: normal Cardio: RRR and No murmurs Resp: CTA B/L and Unlabored GI: BS positive and Nontender and nondistended Extremity:  No Edema Skin:    Intact Neuro: Alert/Oriented, Normal Sensory, Abnormal Motor 4/5 in the right deltoid, biceps, triceps, grip, right hip flexion, knee extension and dorsiflexion5/5 in the left deltoid, biceps, triceps, grip, hip flexion, knee extension, ankle dorsiflexion, Abnormal FMC Ataxic/ dec FMC, Tone:  Increased flexor tone. Right upper extremity, Dysarthric, Aphasic, Inattention and Other Currently, impaired vision, left eye, moderately impaired vision, right eye Musc/Skel:  Other No pain with upper extremity or lower extremity active range of motion Gen. no acute distress   Assessment/Plan: 1. Functional deficits secondary to MCA distribution infarct with right hemiparesis, aphasia and dysarthria which require 3+ hours per day of interdisciplinary therapy in a comprehensive inpatient rehab setting. Physiatrist is providing close team supervision and 24 hour management of active medical problems listed below. Physiatrist and rehab team continue to assess barriers to discharge/monitor patient progress toward functional and medical goals. FIM: Function - Bathing Position: Shower Body parts bathed by patient: Right arm, Left lower leg, Left arm, Back, Chest, Abdomen, Front perineal area, Buttocks, Right upper leg, Left upper leg, Right lower leg Assist Level: Supervision or verbal cues  Function- Upper Body Dressing/Undressing What is the patient wearing?: Pull over shirt/dress Pull over shirt/dress - Perfomed by patient: Thread/unthread right sleeve, Thread/unthread left sleeve, Put head through opening, Pull shirt over trunk Assist Level: Set up Set up : To obtain clothing/put away Function - Lower Body Dressing/Undressing What is the patient wearing?: Pants, Non-skid slipper socks Position: Other (comment) (Sitting on toilet) Pants- Performed by patient: Thread/unthread right pants leg, Thread/unthread left pants leg, Pull pants up/down Non-skid slipper socks- Performed by patient: Don/doff right  sock, Don/doff left sock Assist for footwear: Independent Assist for lower body dressing: Supervision or verbal cues  Function - Toileting Toileting steps completed by patient: Adjust clothing prior to toileting, Performs perineal hygiene, Adjust clothing after toileting Assist level: Touching or steadying assistance (Pt.75%)  Function - Toilet Transfers Assist level to toilet: No Help, no cues, assistive device, takes more than a reasonable amount of time Assist level from toilet: No Help, no cues, assistive device, takes more than a reasonable amount of time  Function - Chair/bed transfer Chair/bed transfer method: Ambulatory  Chair/bed transfer assist level: No Help, no cues, assistive device, takes more than a reasonable amount of time Chair/bed transfer assistive device: Cane, Armrests Chair/bed transfer details: Verbal cues for precautions/safety, Verbal cues for sequencing  Function - Locomotion: Wheelchair Will patient use wheelchair at discharge?: No Function - Locomotion: Ambulation Assistive device: Cane-straight Max distance: 250 ft Assist level: Touching or steadying assistance (Pt > 75%) Assist level: Touching or steadying assistance (Pt > 75%) Assist level: Touching or steadying assistance (Pt > 75%) Assist level: Touching or steadying assistance (Pt > 75%) Assist level: Touching or steadying assistance (Pt > 75%)  Function - Comprehension Comprehension: Auditory Comprehension assist level: Follows basic conversation/direction with extra time/assistive device  Function - Expression Expression: Verbal Expression assist level: Expresses basic 50 - 74% of the time/requires cueing 25 - 49% of the time. Needs to repeat parts of sentences.  Function - Social Interaction Social Interaction assist level: Interacts appropriately 75 - 89% of the time - Needs redirection for appropriate language or to initiate interaction.  Function - Problem Solving Problem solving assist  level: Solves basic problems with no assist  Function - Memory Memory assist level: Recognizes or recalls 75 - 89% of the time/requires cueing 10 - 24% of the time Patient normally able to recall (first 3 days only): Current season, Location of own room, That he or she is in a hospital  Medical Problem List and Plan: 1. Functional and language defiits secondary to left MCA infarct -Initiate CIR PT, OT, speech, target d/c 4/4  2. DVT Prophylaxis/Anticoagulation: Pharmaceutical: Lovenox 3. Pain Management: Tylenol prn HA 4. Mood: LCSW to follow for evaluation and support.  5. Neuropsych: This patient iscapable of making decisions on hisown behalf. 6. Skin/Wound Care: Routine pressure relief measures. Maintain adequate nutritional status 7. Fluids/Electrolytes/Nutrition: Monitor I/O and BMET 85-100% meals 8. T2DM: Poorly controlled--had stopped taking metformin weeks ago. Hgb A1C- 14.4. Was on metformin at home and now on lantus--start education on insulin pen use. Will monitor BS ac/hs.increase lantus to 25u daily with SSI for elevated BS. 9. HTN: Monitor BP bid with permissive HTN for 5-7 days. On HCTZ and prinivil--avoid hypotension. 10. Glaucoma: On alphagan and difluperdnate,  11. Hyponatremia: Question chronic due to diuretic. Recheck in am.  12. Dyslipidemia: Now on lipitor.   LOS (Days) 3 A FACE TO FACE EVALUATION WAS PERFORMED  Brady Church E 02/22/2017, 7:05 AM

## 2017-02-22 NOTE — Progress Notes (Signed)
Physical Therapy Session Note  Patient Details  Name: Angello Chien MRN: [id number] Date of Birth: 1961-05-30  Today's Date: 02/22/2017 PT Individual Time: 1301-1400 AND 1520-1545 PT Individual Time Calculation (min): 59 min AND 25 min   Short Term Goals: Week 1:  PT Short Term Goal 1 (Week 1): STG =LTG due to ELOS  Skilled Therapeutic Interventions/Progress Updates:   Pt received sitting in WC and agreeable to PT  gait in room to bathroom with supervision assist from PT. PT instructed pt in Gait training through hospital 561f x 3 with supervision assist with cues for navigation and awareness of obstacles due to visual deficits. Gait training  over uneven surface of cement sidewalk and up/down handicap ramp to parking lot with supervision assist   Standing balance instructed by PT on wedge with lateral and cross body reach usign BLE. Min assist progressing to supervision assist with 1 UE support reduced to no UE support for last 1/2 of reaching activity.    PT instructed pt in DGI; see below for details.   [facility] and all needs met.     Session 2   Pt received sitting in recliner and agreeable to PT  PT instructed pt Gait training through hall with supervision assist x [id number] well as  dynamic gait training completed x 2. To step up 6 inch  Curb, weave around 5 cones, walk over uneven surface. PT required to provide  Min assist with curb management on first attempt, progressing to supervision assist.   Nustep endurance training, level 6 x 10 minutes. Min cues from PT for decreased speed to reduced excess fatigue.   Patient returned too room and left sitting in recliner with call bell in reach and all needs met.        Therapy Documentation Precautions:  Precautions Precautions: Fall Precaution Comments: R eye blindness Restrictions Weight Bearing Restrictions: No    Pain: Pain Assessment Pain Score:  0-No pain     Balance: Standardized Balance Assessment Standardized Balance Assessment: Dynamic Gait Index Dynamic Gait Index Level Surface: Normal Change in Gait Speed: Mild Impairment Gait with Horizontal Head Turns: Mild Impairment Gait with Vertical Head Turns: Mild Impairment Gait and Pivot Turn: Normal Step Over Obstacle: Mild Impairment Step Around Obstacles: Mild Impairment Steps: Mild Impairment Total Score: 18  (<19 indicates increased fall risk)    See Function Navigator for Current Functional Status.   Therapy/Group: Individual Therapy  AuLorie Phenix/29/2018, 2:00 PM

## 2017-02-22 NOTE — Progress Notes (Signed)
HS CBG=278, SSI=3 units. Brady Church

## 2017-02-23 ENCOUNTER — Inpatient Hospital Stay (HOSPITAL_COMMUNITY): Payer: Medicaid Other | Admitting: Physical Therapy

## 2017-02-23 ENCOUNTER — Inpatient Hospital Stay (HOSPITAL_COMMUNITY): Payer: Medicaid Other | Admitting: Occupational Therapy

## 2017-02-23 ENCOUNTER — Inpatient Hospital Stay (HOSPITAL_COMMUNITY): Payer: Medicaid Other | Admitting: Speech Pathology

## 2017-02-23 DIAGNOSIS — K5901 Slow transit constipation: Secondary | ICD-10-CM

## 2017-02-23 LAB — GLUCOSE, CAPILLARY
GLUCOSE-CAPILLARY: 134 mg/dL — AB (ref 65–99)
GLUCOSE-CAPILLARY: 162 mg/dL — AB (ref 65–99)
Glucose-Capillary: 246 mg/dL — ABNORMAL HIGH (ref 65–99)
Glucose-Capillary: 286 mg/dL — ABNORMAL HIGH (ref 65–99)
Glucose-Capillary: 72 mg/dL (ref 65–99)

## 2017-02-23 MED ORDER — POLYETHYLENE GLYCOL 3350 17 G PO PACK
17.0000 g | PACK | Freq: Two times a day (BID) | ORAL | Status: DC
  Administered 2017-02-23 – 2017-02-27 (×8): 17 g via ORAL
  Filled 2017-02-23 (×8): qty 1

## 2017-02-23 MED ORDER — LISINOPRIL 5 MG PO TABS
2.5000 mg | ORAL_TABLET | Freq: Every day | ORAL | Status: DC
  Administered 2017-02-25: 2.5 mg via ORAL
  Filled 2017-02-23 (×2): qty 1

## 2017-02-23 NOTE — Progress Notes (Signed)
Worked on education with pt and partner regarding insulin pen.  Pt able to count units out verbally and turn pen correct direction for correct number of units.  Pt had difficulty getting correct number of units when he did not verbalize the number of units needed.  Pt encouraged to count out each click.  Partner will bring in supply samples for additional teaching.  Pt doesn't have glucometer at home.  Called diabetes educator.  Informed that pt has no glucometer and would possibly need meter that vocalizes blood sugar.  She informed that pt would need a script from the physician to get taking meter.

## 2017-02-23 NOTE — Progress Notes (Signed)
Speech Language Pathology Daily Session Note  Patient Details  Name: Brady Church MRN: [id number] Date of Birth: May 25, 1961  Today's Date: 02/23/2017 SLP Individual Time: 0930-1000 SLP Individual Time Calculation (min): 30 min  Short Term Goals: Week 1: SLP Short Term Goal 1 (Week 1): Pt will produce CVC words with Mod A verbal cues and 75% intelligibility.  SLP Short Term Goal 2 (Week 1): Pt will self-monitor and self- correct speech intelligibility at the word level with Min A verbal cues. SLP Short Term Goal 3 (Week 1): Pt will use word finding strategies with Min A verbal cues to name 8 items within basic categories.   Skilled Therapeutic Interventions: Skilled treatment session focused on expressive communication goals. SLP facilitated session by providing Min A to supervision when pt communicating with nursing staff about medicine. Pt with >75% intelligiiblity with questions and sentence length comments. Pt was independent with self-monitoring for errors and able to self-correct with 75% accuracy. Pt also with >75% intelligibility when ordering meal from dietary staff. Given a picture card, pt able to state phrase length describe and able to imitated sentence length description of picture with >90% intelligibility. Pt was left in recliner and friend present. Continue with current plan of care. Great progress.      Function:  Eating Eating   Modified Consistency Diet: No Eating Assist Level: Set up assist for   Eating Set Up Assist For: Opening containers       Cognition Comprehension Comprehension assist level: Follows basic conversation/direction with extra time/assistive device  Expression   Expression assist level: Expresses basic 75 - 89% of the time/requires cueing 10 - 24% of the time. Needs helper to occlude trach/needs to repeat words.  Social Interaction Social Interaction assist level: Interacts appropriately 90% of the time - Needs monitoring or encouragement for  participation or interaction.  Problem Solving Problem solving assist level: Solves basic problems with no assist  Memory Memory assist level: Recognizes or recalls 90% of the time/requires cueing < 10% of the time    Pain Pain Assessment Pain Score: 0-No pain  Therapy/Group: Individual Therapy   Nazli Penn B. Dreama Saa, M.S., CCC-SLP Speech-Language Pathologist   Ollin Hochmuth 02/23/2017, 12:38 PM

## 2017-02-23 NOTE — Progress Notes (Signed)
.   Occupational Therapy Session Note  Patient Details  Name: Brady Church MRN: [id number] Date of Birth: 04-01-1961  Today's Date: 02/23/2017 OT Individual Time: 0802-0900 OT Individual Time Calculation (min): 58 min    Short Term Goals: Week 1:  OT Short Term Goal 1 (Week 1): STG=LTG due to LOS  Skilled Therapeutic Interventions/Progress Updates:    OT treatment session focused on modified bathing/dressing, functional use of R UE, R fine-motor coordination, and stereognosis. Pt ambulated to bathroom, stood to void and ambulated into walk-in shower with supervision and min cues to locate shower chair to sit safely. Bathing completed sit<>stand with overall set-up A to open soap container and apply to wash cloth. UB and LB dressing w/ supervision for everything but tying shoes. Applied shoe button and educated pt on adapted strategy-pt still needs practice locating shoe button 2/2 visual and sensation deficits. Pt ambulated to therapy gym for stereognosis and grasp activity. Pt able to locate 2/4 larger tems in bean bin with min cues and increased time. Pt returned to room at end of session and left seated in recliner with needs met.   Therapy Documentation Precautions:  Precautions Precautions: Fall Precaution Comments: R eye blindness Restrictions Weight Bearing Restrictions: No Pain:  no pain/denies pain  See Function Navigator for Current Functional Status.   Therapy/Group: Individual Therapy  Valma Cava 02/23/2017, 8:46 AM

## 2017-02-23 NOTE — Progress Notes (Signed)
Physical Therapy Session Note  Patient Details  Name: Brady Church MRN: [id number] Date of Birth: 1961-08-06  Today's Date: 02/23/2017 PT Individual Time: 1045-1200 AND 1515-1545 PT Individual Time Calculation (min): 75 min AND 30 min   Short Term Goals: Week 1:  PT Short Term Goal 1 (Week 1): STG =LTG due to ELOS  Skilled Therapeutic Interventions/Progress Updates:   Pt received sitting in WC and agreeable to PT  Gait through hospital to main entrance with distant supervision assist from PT. Gait over various surfaces including cement sidewalk, grass and up/down handdicap ramp with distant supervision assist from PT. 3 minor LOB with change in surface height, but patient able self correct without additional assistance.  PT instructed pt in stair training with 1 UE support on Rails and 1 UE to hold cane. Performed x 24 step (4x8). Close supervision assist from PT for ascent and descnt of stairs with cues for use of cane in RUE to improve awareness of loaction of steps.   Seated NMR all completes with 5# ankle weights LAQ, reciprocal marching, hip abduciton, isometric hip adduction Supine NMR bridge, sustained bridge with march, sustained bridge with hip abduction x 10 each.  Qped with donkey kick, Qped with alternating shoulder extension. x10 each   Gait back to room with supervision assist from PT for direction through halls. Patient returned too room and left sitting in recliner with call bell in reach and all needs met.     Session 2.   Gait in simulated home enviroment of day room with supervision assist from PT. Pt able to manage through obstacles of chairs and tables with no evidence of R neglect.    Standing balance training: semi tandem stance on ariex 2 x 30 sec BLE, narrow BOS 2 x 1 minute. Ball toss on solid ground progessing to standing and airex, arm shuttle 2x 3 minutes on solid ground progressing to standing on airex. Supervision assist from PT throughout. Good ankle  strategy to prevent LOB throughough  Patient returned too room and left sitting in recliner with call bell in reach and all needs met.        Therapy Documentation Precautions:  Precautions Precautions: Fall Precaution Comments: R eye blindness Restrictions Weight Bearing Restrictions: No Pain: 0/10   See Function Navigator for Current Functional Status.   Therapy/Group: Individual Therapy  Lorie Phenix 02/23/2017, 5:38 PM

## 2017-02-23 NOTE — Progress Notes (Signed)
Social Work Patient ID: Brady Church, male   DOB: [birth year], 56 y.o.   MRN: [id number] Team feels pt will be reach his goals by Tuesday and want to move up discharge by one day. Met with pt and Lila who  Was here to discuss this and schedule family education for Monday for 9:00 am with therapists. Agreeable to tub bench and home health follow up therapies. Pt is pleased with how well he is doing and progressing Still needs to work on his speech but this is getting better also. MD agreeable to this plan and Pam-PA also aware of this plan.

## 2017-02-23 NOTE — Progress Notes (Signed)
Subjective/Complaints:  No issues overnite except constipation, no abd pain  Review of systems cannot be obtained secondary to  aphasia and dysarthria  Objective: Vital Signs: Blood pressure 110/73, pulse 82, temperature 97.7 F (36.5 C), temperature source Oral, resp. rate 19, height '5\' 8"'$  (1.727 m), weight 98 kg (216 lb 1.6 oz), SpO2 96 %. No results found. Results for orders placed or performed during the hospital encounter of 02/19/17 (from the past 72 hour(s))  CBC WITH DIFFERENTIAL     Status: None   Collection Time: 02/20/17 10:12 AM  Result Value Ref Range   WBC 5.9 4.0 - 10.5 K/uL   RBC 4.28 4.22 - 5.81 MIL/uL   Hemoglobin 14.2 13.0 - 17.0 g/dL   HCT 40.7 39.0 - 52.0 %   MCV 95.1 78.0 - 100.0 fL   MCH 33.2 26.0 - 34.0 pg   MCHC 34.9 30.0 - 36.0 g/dL   RDW 14.9 11.5 - 15.5 %   Platelets 262 150 - 400 K/uL   Neutrophils Relative % 55 %   Neutro Abs 3.2 1.7 - 7.7 K/uL   Lymphocytes Relative 38 %   Lymphs Abs 2.3 0.7 - 4.0 K/uL   Monocytes Relative 5 %   Monocytes Absolute 0.3 0.1 - 1.0 K/uL   Eosinophils Relative 2 %   Eosinophils Absolute 0.1 0.0 - 0.7 K/uL   Basophils Relative 0 %   Basophils Absolute 0.0 0.0 - 0.1 K/uL  Comprehensive metabolic panel     Status: Abnormal   Collection Time: 02/20/17 10:12 AM  Result Value Ref Range   Sodium 131 (L) 135 - 145 mmol/L   Potassium 3.5 3.5 - 5.1 mmol/L   Chloride 96 (L) 101 - 111 mmol/L   CO2 23 22 - 32 mmol/L   Glucose, Bld 358 (H) 65 - 99 mg/dL   BUN 13 6 - 20 mg/dL   Creatinine, Ser 0.96 0.61 - 1.24 mg/dL   Calcium 9.1 8.9 - 10.3 mg/dL   Total Protein 6.9 6.5 - 8.1 g/dL   Albumin 3.6 3.5 - 5.0 g/dL   AST 48 (H) 15 - 41 U/L   ALT 30 17 - 63 U/L   Alkaline Phosphatase 61 38 - 126 U/L   Total Bilirubin 0.6 0.3 - 1.2 mg/dL   GFR calc non Af Amer >60 >60 mL/min   GFR calc Af Amer >60 >60 mL/min    Comment: (NOTE) The eGFR has been calculated using the CKD EPI equation. This calculation has not been validated  in all clinical situations. eGFR's persistently <60 mL/min signify possible Chronic Kidney Disease.    Anion gap 12 5 - 15  Glucose, capillary     Status: Abnormal   Collection Time: 02/20/17 12:08 PM  Result Value Ref Range   Glucose-Capillary 297 (H) 65 - 99 mg/dL  Glucose, capillary     Status: Abnormal   Collection Time: 02/20/17  4:55 PM  Result Value Ref Range   Glucose-Capillary 256 (H) 65 - 99 mg/dL  Glucose, capillary     Status: Abnormal   Collection Time: 02/20/17  9:27 PM  Result Value Ref Range   Glucose-Capillary 243 (H) 65 - 99 mg/dL  Glucose, capillary     Status: Abnormal   Collection Time: 02/21/17  6:32 AM  Result Value Ref Range   Glucose-Capillary 234 (H) 65 - 99 mg/dL  Glucose, capillary     Status: Abnormal   Collection Time: 02/21/17 11:35 AM  Result Value Ref Range  Glucose-Capillary 287 (H) 65 - 99 mg/dL  Glucose, capillary     Status: Abnormal   Collection Time: 02/21/17  4:55 PM  Result Value Ref Range   Glucose-Capillary 178 (H) 65 - 99 mg/dL  Glucose, capillary     Status: Abnormal   Collection Time: 02/21/17  8:20 PM  Result Value Ref Range   Glucose-Capillary 287 (H) 65 - 99 mg/dL  Glucose, capillary     Status: Abnormal   Collection Time: 02/22/17  6:58 AM  Result Value Ref Range   Glucose-Capillary 253 (H) 65 - 99 mg/dL  Glucose, capillary     Status: Abnormal   Collection Time: 02/22/17 11:26 AM  Result Value Ref Range   Glucose-Capillary 325 (H) 65 - 99 mg/dL   Comment 1 Notify RN   Glucose, capillary     Status: Abnormal   Collection Time: 02/22/17  5:44 PM  Result Value Ref Range   Glucose-Capillary 152 (H) 65 - 99 mg/dL   Comment 1 Notify RN   Glucose, capillary     Status: None   Collection Time: 02/22/17  9:48 PM  Result Value Ref Range   Glucose-Capillary 94 65 - 99 mg/dL  Glucose, capillary     Status: Abnormal   Collection Time: 02/23/17  6:40 AM  Result Value Ref Range   Glucose-Capillary 134 (H) 65 - 99 mg/dL      HEENT: normal Cardio: RRR and No murmurs Resp: CTA B/L and Unlabored GI: BS positive and Nontender and nondistended Extremity:  No Edema Skin:   Intact Neuro: Alert/Oriented, Normal Sensory, Abnormal Motor 4/5 in the right deltoid, biceps, triceps, grip, right hip flexion, knee extension and dorsiflexion5/5 in the left deltoid, biceps, triceps, grip, hip flexion, knee extension, ankle dorsiflexion, Abnormal FMC Ataxic/ dec FMC, Tone:  Increased flexor tone. Right upper extremity, Dysarthric, Aphasic, Inattention and Other Currently, impaired vision, left eye, moderately impaired vision, right eye Musc/Skel:  Other No pain with upper extremity or lower extremity active range of motion Gen. no acute distress   Assessment/Plan: 1. Functional deficits secondary to MCA distribution infarct with right hemiparesis, aphasia and dysarthria which require 3+ hours per day of interdisciplinary therapy in a comprehensive inpatient rehab setting. Physiatrist is providing close team supervision and 24 hour management of active medical problems listed below. Physiatrist and rehab team continue to assess barriers to discharge/monitor patient progress toward functional and medical goals. FIM: Function - Bathing Position: Shower Body parts bathed by patient: Right arm, Left lower leg, Left arm, Back, Chest, Abdomen, Front perineal area, Buttocks, Right upper leg, Left upper leg, Right lower leg Assist Level: Supervision or verbal cues  Function- Upper Body Dressing/Undressing What is the patient wearing?: Pull over shirt/dress Pull over shirt/dress - Perfomed by patient: Thread/unthread right sleeve, Thread/unthread left sleeve, Put head through opening, Pull shirt over trunk Assist Level: More than reasonable time Set up : To obtain clothing/put away Function - Lower Body Dressing/Undressing What is the patient wearing?: Pants, Non-skid slipper socks Position: Sitting EOB Pants- Performed by patient:  Thread/unthread right pants leg, Thread/unthread left pants leg, Pull pants up/down Non-skid slipper socks- Performed by patient: Don/doff right sock, Don/doff left sock Assist for footwear: Independent Assist for lower body dressing: Supervision or verbal cues  Function - Toileting Toileting steps completed by patient: Adjust clothing prior to toileting, Performs perineal hygiene, Adjust clothing after toileting Assist level: Touching or steadying assistance (Pt.75%)  Function - Toilet Transfers Assist level to toilet: (P) Supervision or verbal  cues Assist level from toilet: (P) Supervision or verbal cues  Function - Chair/bed transfer Chair/bed transfer method: Ambulatory Chair/bed transfer assist level: Supervision or verbal cues Chair/bed transfer assistive device: Cane, Armrests Chair/bed transfer details: Verbal cues for precautions/safety, Verbal cues for sequencing  Function - Locomotion: Wheelchair Will patient use wheelchair at discharge?: No Function - Locomotion: Ambulation Assistive device: Cane-straight Max distance: 573f  Assist level: Supervision or verbal cues Assist level: Supervision or verbal cues Assist level: Supervision or verbal cues Assist level: Supervision or verbal cues Assist level: Supervision or verbal cues  Function - Comprehension Comprehension: Auditory Comprehension assist level: Follows basic conversation/direction with extra time/assistive device  Function - Expression Expression: Verbal Expression assist level: Expresses basic 50 - 74% of the time/requires cueing 25 - 49% of the time. Needs to repeat parts of sentences.  Function - Social Interaction Social Interaction assist level: Interacts appropriately 90% of the time - Needs monitoring or encouragement for participation or interaction.  Function - Problem Solving Problem solving assist level: Solves basic problems with no assist  Function - Memory Memory assist level: Recognizes  or recalls 90% of the time/requires cueing < 10% of the time Patient normally able to recall (first 3 days only): Current season, Location of own room, Staff names and faces, That he or she is in a hospital  Medical Problem List and Plan: 1. Functional and language defiits secondary to left MCA infarct -Initiate CIR PT, OT, speech, target d/c 4/4  2. DVT Prophylaxis/Anticoagulation: Pharmaceutical: Lovenox 3. Pain Management: Tylenol prn HA 4. Mood: LCSW to follow for evaluation and support.  5. Neuropsych: This patient iscapable of making decisions on hisown behalf. 6. Skin/Wound Care: Routine pressure relief measures. Maintain adequate nutritional status 7. Fluids/Electrolytes/Nutrition: Monitor I/O and BMET 85-100% meals 8. T2DM: Poorly controlled--had stopped taking metformin weeks ago. Hgb A1C- 14.4. Was on metformin at home and now on lantus--start education on insulin pen use. Will monitor BS ac/hs.increase lantus to 25u daily with SSI for elevated BS. CBG (last 3)   Recent Labs  02/22/17 1744 02/22/17 2148 02/23/17 0640  GLUCAP 152* 94 134*   9. HTN: Monitor BP bid with permissive  On HCTZ and prinivil--avoid hypotension.reduce zestril to 2.'5mg'$  Vitals:   02/22/17 1500 02/23/17 0449  BP: 122/64 110/73  Pulse: 84 82  Resp: 20 19  Temp: 98.4 F (36.9 C) 97.7 F (36.5 C)  10. Glaucoma: On alphagan and difluperdnate,  11. Hyponatremia: Question chronic due to diuretic. Recheck in am.  12. Dyslipidemia: Now on lipitor.  13.  Constipation miralax BID LOS (Days) 4 A FACE TO FACE EVALUATION WAS PERFORMED  Brady Church E 02/23/2017, 8:09 AM

## 2017-02-24 ENCOUNTER — Inpatient Hospital Stay (HOSPITAL_COMMUNITY): Payer: Medicaid Other | Admitting: *Deleted

## 2017-02-24 ENCOUNTER — Inpatient Hospital Stay (HOSPITAL_COMMUNITY): Payer: Medicaid Other | Admitting: Occupational Therapy

## 2017-02-24 ENCOUNTER — Inpatient Hospital Stay (HOSPITAL_COMMUNITY): Payer: Medicaid Other

## 2017-02-24 LAB — GLUCOSE, CAPILLARY
GLUCOSE-CAPILLARY: 187 mg/dL — AB (ref 65–99)
GLUCOSE-CAPILLARY: 179 mg/dL — AB (ref 65–99)
Glucose-Capillary: 181 mg/dL — ABNORMAL HIGH (ref 65–99)
Glucose-Capillary: 225 mg/dL — ABNORMAL HIGH (ref 65–99)

## 2017-02-24 NOTE — Progress Notes (Signed)
Physical Therapy Session Note  Patient Details  Name: Brady Church MRN: [id number] Date of Birth: [birth year]  Today's Date: 02/24/2017 PT Individual Time: 1015-1100 PT Individual Time Calculation (min): 45 min   Short Term Goals: Week 1:  PT Short Term Goal 1 (Week 1): STG =LTG due to ELOS  Skilled Therapeutic Interventions/Progress Updates:   Pt's caregiver present for PT family education. PT instructed pt in gait in room with distant supervision assist with min cues for safety through door with AD. Gait through hospital and over cement sidewalk at hospital front entrance with distant supervision assist 565f x2 and [id number] provided education to caregiver for safety cues with change in surface and potential obstacles on the in a new environment. Stair training x 40steps at parking deck with supervision assist from PT and from caregiver with cues to inform pt on stop and start of steps at each landing. Car transfer with distant supervision assist with cues to pt and care giver for safe stand pivot technique.  PT instructed patient in modified [employer]alzellxercise program with hand out provided. Pt's care giver verbalized understanding of exercises and safety cues.   Patient returned too room and left sitting in reclienr with call bell in reach and all needs met.         Therapy Documentation Precautions:  Precautions Precautions: Fall Precaution Comments: R eye blindness Restrictions Weight Bearing Restrictions: No    Pain:   0/10   See Function Navigator for Current Functional Status.   Therapy/Group: Individual Therapy  AuLorie Phenix/31/2018, 11:01 AM

## 2017-02-24 NOTE — Progress Notes (Signed)
Subjective/Complaints:  No issues overnite  Review of systems cannot be obtained secondary to  aphasia and dysarthria  Objective: Vital Signs: Blood pressure 104/61, pulse 62, temperature 97.6 F (36.4 C), temperature source Oral, resp. rate 20, height 5\' 8"  (1.727 m), weight 98 kg (216 lb 1.6 oz), SpO2 100 %. No results found. Results for orders placed or performed during the hospital encounter of 02/19/17 (from the past 72 hour(s))  Glucose, capillary     Status: Abnormal   Collection Time: 02/21/17 11:35 AM  Result Value Ref Range   Glucose-Capillary 287 (H) 65 - 99 mg/dL  Glucose, capillary     Status: Abnormal   Collection Time: 02/21/17  4:55 PM  Result Value Ref Range   Glucose-Capillary 178 (H) 65 - 99 mg/dL  Glucose, capillary     Status: Abnormal   Collection Time: 02/21/17  8:20 PM  Result Value Ref Range   Glucose-Capillary 287 (H) 65 - 99 mg/dL  Glucose, capillary     Status: Abnormal   Collection Time: 02/22/17  6:58 AM  Result Value Ref Range   Glucose-Capillary 253 (H) 65 - 99 mg/dL  Glucose, capillary     Status: Abnormal   Collection Time: 02/22/17 11:26 AM  Result Value Ref Range   Glucose-Capillary 325 (H) 65 - 99 mg/dL   Comment 1 Notify RN   Glucose, capillary     Status: Abnormal   Collection Time: 02/22/17  5:44 PM  Result Value Ref Range   Glucose-Capillary 152 (H) 65 - 99 mg/dL   Comment 1 Notify RN   Glucose, capillary     Status: None   Collection Time: 02/22/17  9:48 PM  Result Value Ref Range   Glucose-Capillary 94 65 - 99 mg/dL  Glucose, capillary     Status: Abnormal   Collection Time: 02/23/17  6:40 AM  Result Value Ref Range   Glucose-Capillary 134 (H) 65 - 99 mg/dL  Glucose, capillary     Status: Abnormal   Collection Time: 02/23/17 12:03 PM  Result Value Ref Range   Glucose-Capillary 246 (H) 65 - 99 mg/dL   Comment 1 Notify RN   Glucose, capillary     Status: Abnormal   Collection Time: 02/23/17  4:47 PM  Result Value Ref  Range   Glucose-Capillary 286 (H) 65 - 99 mg/dL   Comment 1 Notify RN   Glucose, capillary     Status: None   Collection Time: 02/23/17  9:27 PM  Result Value Ref Range   Glucose-Capillary 72 65 - 99 mg/dL  Glucose, capillary     Status: Abnormal   Collection Time: 02/23/17 10:29 PM  Result Value Ref Range   Glucose-Capillary 162 (H) 65 - 99 mg/dL  Glucose, capillary     Status: Abnormal   Collection Time: 02/24/17  6:51 AM  Result Value Ref Range   Glucose-Capillary 181 (H) 65 - 99 mg/dL     HEENT: normal Cardio: RRR and No murmurs Resp: CTA B/L and Unlabored GI: BS positive and Nontender and nondistended Extremity:  No Edema Skin:   Intact Neuro: Alert/Oriented, Normal Sensory, Abnormal Motor 4/5 in the right deltoid, biceps, triceps, grip, right hip flexion, knee extension and dorsiflexion5/5 in the left deltoid, biceps, triceps, grip, hip flexion, knee extension, ankle dorsiflexion, Abnormal FMC Ataxic/ dec FMC, Tone:  Increased flexor tone. Right upper extremity, Dysarthric, Aphasic, Inattention and Other Currently, impaired vision, left eye, moderately impaired vision, right eye Musc/Skel:  Other No pain with upper extremity  or lower extremity active range of motion Gen. no acute distress   Assessment/Plan: 1. Functional deficits secondary to MCA distribution infarct with right hemiparesis, aphasia and dysarthria which require 3+ hours per day of interdisciplinary therapy in a comprehensive inpatient rehab setting. Physiatrist is providing close team supervision and 24 hour management of active medical problems listed below. Physiatrist and rehab team continue to assess barriers to discharge/monitor patient progress toward functional and medical goals. FIM: Function - Bathing Position: Shower Body parts bathed by patient: Right arm, Left lower leg, Left arm, Back, Chest, Abdomen, Front perineal area, Buttocks, Right upper leg, Left upper leg, Right lower leg Assist Level:  Supervision or verbal cues  Function- Upper Body Dressing/Undressing What is the patient wearing?: Pull over shirt/dress Pull over shirt/dress - Perfomed by patient: Thread/unthread right sleeve, Thread/unthread left sleeve, Put head through opening, Pull shirt over trunk Assist Level: More than reasonable time Set up : To obtain clothing/put away Function - Lower Body Dressing/Undressing What is the patient wearing?: Pants, Non-skid slipper socks Position: Sitting EOB Pants- Performed by patient: Thread/unthread right pants leg, Thread/unthread left pants leg, Pull pants up/down Non-skid slipper socks- Performed by patient: Don/doff right sock, Don/doff left sock Assist for footwear: Independent Assist for lower body dressing: Supervision or verbal cues  Function - Toileting Toileting steps completed by patient: Adjust clothing prior to toileting, Performs perineal hygiene, Adjust clothing after toileting Toileting Assistive Devices: Grab bar or rail Assist level: (P) Touching or steadying assistance (Pt.75%)  Function - Toilet Transfers Assist level to toilet: Supervision or verbal cues Assist level from toilet: Supervision or verbal cues  Function - Chair/bed transfer Chair/bed transfer method: Ambulatory Chair/bed transfer assist level: Supervision or verbal cues Chair/bed transfer assistive device: Cane Chair/bed transfer details: Verbal cues for precautions/safety, Verbal cues for sequencing  Function - Locomotion: Wheelchair Will patient use wheelchair at discharge?: No Function - Locomotion: Ambulation Assistive device: Cane-straight Max distance: 449ft  Assist level: Supervision or verbal cues Assist level: Supervision or verbal cues Assist level: Supervision or verbal cues Assist level: Supervision or verbal cues Assist level: Supervision or verbal cues  Function - Comprehension Comprehension: Auditory Comprehension assist level: Follows basic  conversation/direction with extra time/assistive device  Function - Expression Expression: Verbal Expression assist level: Expresses basic 75 - 89% of the time/requires cueing 10 - 24% of the time. Needs helper to occlude trach/needs to repeat words.  Function - Social Interaction Social Interaction assist level: Interacts appropriately 90% of the time - Needs monitoring or encouragement for participation or interaction.  Function - Problem Solving Problem solving assist level: Solves basic problems with no assist  Function - Memory Memory assist level: Recognizes or recalls 90% of the time/requires cueing < 10% of the time Patient normally able to recall (first 3 days only): Current season, Location of own room, Staff names and faces, That he or she is in a hospital  Medical Problem List and Plan: 1. Functional and language defiits secondary to left MCA infarct Cont CIR PT, OT, speech, target d/c 4/4  2. DVT Prophylaxis/Anticoagulation: Pharmaceutical: Lovenox 3. Pain Management: Tylenol prn HA 4. Mood: LCSW to follow for evaluation and support.  5. Neuropsych: This patient iscapable of making decisions on hisown behalf. 6. Skin/Wound Care: Routine pressure relief measures. Maintain adequate nutritional status 7. Fluids/Electrolytes/Nutrition: Monitor I/O and BMET 85-100% meals 8. T2DM: Poorly controlled--had stopped taking metformin weeks ago. Hgb A1C- 14.4. Was on metformin at home and now on lantus--start education on insulin pen  use. Will monitor BS ac/hs.continue lantus to 25u daily with SSI for elevated BS. CBG (last 3)   Recent Labs  02/23/17 2127 02/23/17 2229 02/24/17 0651  GLUCAP 72 162* 181*   9. HTN: Monitor BP bid with permissive  On HCTZ and prinivil--avoid hypotension.reduce zestril to 2.5mg  Vitals:   02/23/17 1328 02/24/17 0550  BP: 112/65 104/61  Pulse: 78 62  Resp: 20 20  Temp: 98.3 F (36.8 C) 97.6 F (36.4 C)  10. Glaucoma: On  alphagan and difluperdnate,  11. Hyponatremia: Question chronic due to diuretic. Recheck in am.  12. Dyslipidemia: Now on lipitor.  13.  Constipation miralax BID LOS (Days) 5 A FACE TO FACE EVALUATION WAS PERFORMED  Riggins Cisek E 02/24/2017, 7:42 AM

## 2017-02-24 NOTE — Progress Notes (Signed)
Occupational Therapy Session Note  Patient Details  Name: Brady Church MRN: [id number] Date of Birth: 1961/03/08  Today's Date: 02/24/2017 OT Individual Time: 6433-2951 and 1300-1345 OT Individual Time Calculation (min): 58 min and 45 min   Short Term Goals: Week 1:  OT Short Term Goal 1 (Week 1): STG=LTG due to LOS  Skilled Therapeutic Interventions/Progress Updates:    Visit 1:  No c/o pain Pt seen for ADL retraining with a focus on coordination and balance.  Pt agreeable to shower.  Pt ambulated around room with close S. Stood at sink to brush teeth ( pt placed toothpaste in mouth after having great difficulty placing on toothbrush) and then ambulated to toilet then shower.  Used shower seat when bathing UB and feet but stood rest of time in shower with no A needed.  Ambulated to bed to dress with set up.  He then transferred to recliner to work on UE AROM / coordination exercises of "punching bag" and B movement of arms with overhead and forward claps and increasing speed.  Repeated exercises a second time in standing with S.   Standing balance exercises of squats with S and lateral steps out and in with touching A.  Pt states he feels prepared to go home on Tuesday and did not have any concerns.  Pt resting in recliner in room with friend in room with him.  Visit 2:no c/o pain Pt ambulated to ADL with walking cane and close S. Education with pt and SO on safe tub transfers with tub bench in ADL apt. Pt is able to complete transfer with S.  Discussed safe set up, using tub strips, and donning slippers to ambulate out of bathroom to decrease fall risk.  Pt ambulated to gym and worked on dynamic standing balance of forward stepping/ lunging with no LOB.  Standing and alternating reaches down towards knees. Pt tends to stand with feet together, cues for wider stance.  Pt acknowledges this gives him better support.  UE coordination exercises with using his walking stick for over head reaches,  forward/ back rowing.  [facility] with focus on R thumb ROM and strength. Provided pt with tennis ball for ROM of web space and thumb abduction/rotation; foam block for lateral and O pinch, and med soft therapy for general grasp strength. Explanation and demonstration with repeat demonstration from pt. His SO observed session and will have him follow through on the exercises.  Pt ambulated back to his room with walking stick with S.   Therapy Documentation Precautions:  Precautions Precautions: Fall Precaution Comments: R eye blindness Restrictions Weight Bearing Restrictions: No   Pain: Pain Assessment Pain Assessment: No/denies pain ADL:  See Function Navigator for Current Functional Status.   Therapy/Group: Individual Therapy  Roquel Burgin 02/24/2017, 11:39 AM

## 2017-02-24 NOTE — Progress Notes (Signed)
Occupational Therapy Session Note  Patient Details  Name: Brady Church MRN: [id number] Date of Birth: Aug 30, 1961  Today's Date: 02/24/2017 OT Individual Time: 3825-0539 OT Individual Time Calculation (min): 43 min    Short Term Goals: Week 1:  OT Short Term Goal 1 (Week 1): STG=LTG due to LOS  Skilled Therapeutic Interventions/Progress Updates:    1:1. No complaint of pain. Focus of session on functional FMC, endurance, transfers, and functional use of RUE. Pt ambulates with cane to/from all therapuetic destinations with supervision. No LOB this date. Pt completes transfer onto tub transfer bench with supervision and VC for safety awareness. Pt completes ambulatory couch transfer with supervision. In standing, pt able to copy pipe tree shape from picture with VC from OT to incorporate RUE into activity and use sensation to compensate for decreased vision. Pipe tree set up on R side to increase awareness. Pt stands for >10 min to complete activity. Pt ambulates to coffee maker and with step by step cueing d/t novelty brews coffee, wipes down counter and transports cup to room. Exited session with pt seated in recliner, call light in reach, and friend present.   Therapy Documentation Precautions:  Precautions Precautions: Fall Precaution Comments: R eye blindness Restrictions Weight Bearing Restrictions: No General:   Vital Signs:   Pain: Pain Assessment Pain Assessment: No/denies pain Pain Score: 0-No pain  See Function Navigator for Current Functional Status.   Therapy/Group: Individual Therapy  Shon Hale 02/24/2017, 12:10 PM

## 2017-02-25 ENCOUNTER — Inpatient Hospital Stay (HOSPITAL_COMMUNITY): Payer: Medicaid Other

## 2017-02-25 LAB — GLUCOSE, CAPILLARY
GLUCOSE-CAPILLARY: 143 mg/dL — AB (ref 65–99)
Glucose-Capillary: 181 mg/dL — ABNORMAL HIGH (ref 65–99)
Glucose-Capillary: 209 mg/dL — ABNORMAL HIGH (ref 65–99)
Glucose-Capillary: 178 mg/dL — ABNORMAL HIGH (ref 65–99)
Glucose-Capillary: 166 mg/dL — ABNORMAL HIGH (ref 65–99)

## 2017-02-25 NOTE — Progress Notes (Signed)
Subjective/Complaints:  Pt states he lost insulin pen  Review of systems cannot be obtained secondary to  aphasia and dysarthria  Objective: Vital Signs: Blood pressure 121/77, pulse 71, temperature 97.8 F (36.6 C), temperature source Oral, resp. rate 18, height 5\' 8"  (1.727 m), weight 98 kg (216 lb 1.6 oz), SpO2 95 %. No results found. Results for orders placed or performed during the hospital encounter of 02/19/17 (from the past 72 hour(s))  Glucose, capillary     Status: Abnormal   Collection Time: 02/22/17 11:26 AM  Result Value Ref Range   Glucose-Capillary 325 (H) 65 - 99 mg/dL   Comment 1 Notify RN   Glucose, capillary     Status: Abnormal   Collection Time: 02/22/17  5:44 PM  Result Value Ref Range   Glucose-Capillary 152 (H) 65 - 99 mg/dL   Comment 1 Notify RN   Glucose, capillary     Status: None   Collection Time: 02/22/17  9:48 PM  Result Value Ref Range   Glucose-Capillary 94 65 - 99 mg/dL  Glucose, capillary     Status: Abnormal   Collection Time: 02/23/17  6:40 AM  Result Value Ref Range   Glucose-Capillary 134 (H) 65 - 99 mg/dL  Glucose, capillary     Status: Abnormal   Collection Time: 02/23/17 12:03 PM  Result Value Ref Range   Glucose-Capillary 246 (H) 65 - 99 mg/dL   Comment 1 Notify RN   Glucose, capillary     Status: Abnormal   Collection Time: 02/23/17  4:47 PM  Result Value Ref Range   Glucose-Capillary 286 (H) 65 - 99 mg/dL   Comment 1 Notify RN   Glucose, capillary     Status: None   Collection Time: 02/23/17  9:27 PM  Result Value Ref Range   Glucose-Capillary 72 65 - 99 mg/dL  Glucose, capillary     Status: Abnormal   Collection Time: 02/23/17 10:29 PM  Result Value Ref Range   Glucose-Capillary 162 (H) 65 - 99 mg/dL  Glucose, capillary     Status: Abnormal   Collection Time: 02/24/17  6:51 AM  Result Value Ref Range   Glucose-Capillary 181 (H) 65 - 99 mg/dL  Glucose, capillary     Status: Abnormal   Collection Time: 02/24/17 12:06  PM  Result Value Ref Range   Glucose-Capillary 187 (H) 65 - 99 mg/dL   Comment 1 Notify RN   Glucose, capillary     Status: Abnormal   Collection Time: 02/24/17  4:38 PM  Result Value Ref Range   Glucose-Capillary 225 (H) 65 - 99 mg/dL   Comment 1 Notify RN   Glucose, capillary     Status: Abnormal   Collection Time: 02/24/17  9:06 PM  Result Value Ref Range   Glucose-Capillary 179 (H) 65 - 99 mg/dL  Glucose, capillary     Status: Abnormal   Collection Time: 02/25/17  7:18 AM  Result Value Ref Range   Glucose-Capillary 181 (H) 65 - 99 mg/dL     HEENT: normal Cardio: RRR and No murmurs Resp: CTA B/L and Unlabored GI: BS positive and Nontender and nondistended Extremity:  No Edema Skin:   Intact Neuro: Alert/Oriented, Normal Sensory, Abnormal Motor 4/5 in the right deltoid, biceps, triceps, grip, right hip flexion, knee extension and dorsiflexion5/5 in the left deltoid, biceps, triceps, grip, hip flexion, knee extension, ankle dorsiflexion, Abnormal FMC Ataxic/ dec FMC, Tone:  Increased flexor tone. Right upper extremity, Dysarthric, Aphasic, Inattention and Other Currently,  impaired vision, left eye, moderately impaired vision, right eye Musc/Skel:  Other No pain with upper extremity or lower extremity active range of motion Gen. no acute distress   Assessment/Plan: 1. Functional deficits secondary to MCA distribution infarct with right hemiparesis, aphasia and dysarthria which require 3+ hours per day of interdisciplinary therapy in a comprehensive inpatient rehab setting. Physiatrist is providing close team supervision and 24 hour management of active medical problems listed below. Physiatrist and rehab team continue to assess barriers to discharge/monitor patient progress toward functional and medical goals. FIM: Function - Bathing Position: Shower Body parts bathed by patient: Right arm, Left lower leg, Left arm, Back, Chest, Abdomen, Front perineal area, Buttocks, Right upper  leg, Left upper leg, Right lower leg Assist Level: Set up, Supervision or verbal cues  Function- Upper Body Dressing/Undressing What is the patient wearing?: Pull over shirt/dress Pull over shirt/dress - Perfomed by patient: Thread/unthread right sleeve, Thread/unthread left sleeve, Put head through opening, Pull shirt over trunk Assist Level: More than reasonable time Set up : To obtain clothing/put away Function - Lower Body Dressing/Undressing What is the patient wearing?: Underwear, Pants, Non-skid slipper socks Position: Sitting EOB Underwear - Performed by patient: Thread/unthread right underwear leg, Thread/unthread left underwear leg, Pull underwear up/down Pants- Performed by patient: Thread/unthread right pants leg, Thread/unthread left pants leg, Pull pants up/down Non-skid slipper socks- Performed by patient: Don/doff right sock, Don/doff left sock Assist for footwear: Independent Assist for lower body dressing: Supervision or verbal cues  Function - Toileting Toileting steps completed by patient: Adjust clothing prior to toileting, Performs perineal hygiene, Adjust clothing after toileting Toileting Assistive Devices: Grab bar or rail Assist level: Touching or steadying assistance (Pt.75%)  Function - Archivist transfer assistive device: Grab bar Assist level to toilet: Touching or steadying assistance (Pt > 75%) Assist level from toilet: Touching or steadying assistance (Pt > 75%)  Function - Chair/bed transfer Chair/bed transfer method: Ambulatory Chair/bed transfer assist level: Supervision or verbal cues Chair/bed transfer assistive device: Cane Chair/bed transfer details: Verbal cues for precautions/safety, Verbal cues for sequencing  Function - Locomotion: Wheelchair Will patient use wheelchair at discharge?: No Function - Locomotion: Ambulation Assistive device: Cane-straight Max distance: 432ft  Assist level: Supervision or verbal cues Assist  level: Supervision or verbal cues Assist level: Supervision or verbal cues Assist level: Supervision or verbal cues Assist level: Supervision or verbal cues  Function - Comprehension Comprehension: Auditory Comprehension assist level: Understands complex 90% of the time/cues 10% of the time  Function - Expression Expression: Verbal Expression assist level: Expresses basic 75 - 89% of the time/requires cueing 10 - 24% of the time. Needs helper to occlude trach/needs to repeat words.  Function - Social Interaction Social Interaction assist level: Interacts appropriately 90% of the time - Needs monitoring or encouragement for participation or interaction.  Function - Problem Solving Problem solving assist level: Solves basic problems with no assist  Function - Memory Memory assist level: Recognizes or recalls 90% of the time/requires cueing < 10% of the time Patient normally able to recall (first 3 days only): Current season, Location of own room, Staff names and faces, That he or she is in a hospital  Medical Problem List and Plan: 1. Functional and language defiits secondary to left MCA infarct Cont CIR PT, OT, speech, target d/c 4/4  2. DVT Prophylaxis/Anticoagulation: Pharmaceutical: Lovenox 3. Pain Management: Tylenol prn HA 4. Mood: LCSW to follow for evaluation and support.  5. Neuropsych: This patient iscapable  of making decisions on hisown behalf. 6. Skin/Wound Care: Routine pressure relief measures. Maintain adequate nutritional status 7. Fluids/Electrolytes/Nutrition: Monitor I/O and BMET 85-100% meals 8. T2DM: Poorly controlled--had stopped taking metformin weeks ago. Hgb A1C- 14.4. Was on metformin at home and now on lantus--start education on insulin pen use. Will monitor BS ac/hs.continue lantus to 25u daily with SSI for elevated BS. CBG (last 3)   Recent Labs  02/24/17 1638 02/24/17 2106 02/25/17 0718  GLUCAP 225* 179* 181*   9. HTN: Monitor BP  bid with permissive  On HCTZ and prinivil--avoid hypotension.reduce zestril to 2.5mg  Vitals:   02/24/17 1427 02/25/17 0451  BP: 127/77 121/77  Pulse: 73 71  Resp: 19 18  Temp: 97.9 F (36.6 C) 97.8 F (36.6 C)  10. Glaucoma: On alphagan and difluperdnate,  11. Hyponatremia: Question chronic due to diuretic. Recheck in am.  12. Dyslipidemia: Now on lipitor.  13.  Constipation miralax BID LOS (Days) 6 A FACE TO FACE EVALUATION WAS PERFORMED  Tineshia Becraft E 02/25/2017, 7:34 AM

## 2017-02-25 NOTE — Progress Notes (Signed)
Occupational Therapy Session Note  Patient Details  Name: Brady Church MRN: [id number] Date of Birth: 12-27-1960  Today's Date: 02/25/2017 OT Individual Time: 1306-1400 OT Individual Time Calculation (min): 54 min    Short Term Goals: Week 2:     Skilled Therapeutic Interventions/Progress Updates:    No complaint of pain. Pt seen seated EOB agreeable to bathe and dress session. Focus on ADL retraining, functional mobility and low vision strategies in kitchen. Pt ambulates throughout session with supervision with no LOB noted and VC for pathfinding. Pt voids urine in standing completing clothing management/hygiene with supervision. Pt transfers onto TTB and bathes in seated/standing with set up. Pt gathers clothes from drawer and dresses EOB with set up. Pt applies deodorant and brushes teeth with VC to apply toothpaste directly to mouth when demo difficulty applying toothpate onto toothbrush bristles. In kitchen, Pt educated on adaptive strategies for cooking with low vision, such as using large pront labels for cabinets, moving needed items to counter or near shelves, and using puffy paint to identify most used buttons on appliances/knobs on stove. With MOD VC for sequencing and locating items, pt cooks one small pancake on stove with HOH A to use spatula to flip pancake. Exited session with pt seated in recliner with call light in reach and all needs met. Educated pt on importance of calling for assistance before getting up as pt refused QRB.   Therapy Documentation Precautions:  Precautions Precautions: Fall Precaution Comments: R eye blindness Restrictions Weight Bearing Restrictions: No    Other Treatments:    See Function Navigator for Current Functional Status.   Therapy/Group: Individual Therapy  Tonny Branch 02/25/2017, 3:43 PM

## 2017-02-26 ENCOUNTER — Inpatient Hospital Stay (HOSPITAL_COMMUNITY): Payer: Medicaid Other | Admitting: Occupational Therapy

## 2017-02-26 ENCOUNTER — Ambulatory Visit (HOSPITAL_COMMUNITY): Payer: Medicaid Other | Admitting: Physical Therapy

## 2017-02-26 ENCOUNTER — Inpatient Hospital Stay (HOSPITAL_COMMUNITY): Payer: Medicaid Other | Admitting: Speech Pathology

## 2017-02-26 DIAGNOSIS — R55 Syncope and collapse: Secondary | ICD-10-CM

## 2017-02-26 LAB — BASIC METABOLIC PANEL
Anion gap: 9 (ref 5–15)
BUN: 12 mg/dL (ref 6–20)
CO2: 27 mmol/L (ref 22–32)
CREATININE: 0.81 mg/dL (ref 0.61–1.24)
Calcium: 9.3 mg/dL (ref 8.9–10.3)
Chloride: 101 mmol/L (ref 101–111)
GFR calc Af Amer: >60 mL/min (ref >60)
Glucose, Bld: 164 mg/dL — ABNORMAL HIGH (ref 65–99)
POTASSIUM: 3.5 mmol/L (ref 3.5–5.1)
SODIUM: 137 mmol/L (ref 135–145)

## 2017-02-26 LAB — CBC
HEMATOCRIT: 39.4 % (ref 39.0–52.0)
Hemoglobin: 13.6 g/dL (ref 13.0–17.0)
MCH: 32.6 pg (ref 26.0–34.0)
MCHC: 34.5 g/dL (ref 30.0–36.0)
MCV: 94.5 fL (ref 78.0–100.0)
PLATELETS: 317 10*3/uL (ref 150–400)
RBC: 4.17 MIL/uL — ABNORMAL LOW (ref 4.22–5.81)
RDW: 14.4 % (ref 11.5–15.5)
WBC: 4.8 10*3/uL (ref 4.0–10.5)

## 2017-02-26 LAB — GLUCOSE, CAPILLARY
Glucose-Capillary: 163 mg/dL — ABNORMAL HIGH (ref 65–99)
Glucose-Capillary: 173 mg/dL — ABNORMAL HIGH (ref 65–99)
Glucose-Capillary: 199 mg/dL — ABNORMAL HIGH (ref 65–99)
Glucose-Capillary: 197 mg/dL — ABNORMAL HIGH (ref 65–99)

## 2017-02-26 NOTE — Progress Notes (Signed)
Occupational Therapy Discharge Summary  Patient Details  Name: Brady Church MRN: [id number] Date of Birth: August 09, 1961   Patient has met 10 of 10 long term goals due to improved activity tolerance, improved balance, postural control, functional use of  RIGHT upper extremity, improved awareness and improved coordination.  Patient to discharge at overall Supervision level.  Patient's care partner is independent to provide the necessary physical assistance at discharge.  Pt discharging at overall supervision level due to impaired vision, dysarthria, and  Expressive aphasia. Aphasia has improved since admission, however, pt would be unable to let needs be known if in an emergency. Pt with R eye blindness at baseline, difficult to assess if CVA has further impaired vision. Pt's caregiver has completed family education and has voiced feeling comfortable and confident with providing needed assist at d/c.     Recommendation:  Patient will benefit from ongoing skilled OT services in home health setting to continue to advance functional skills in the area of BADL and iADL.  Equipment: tub transfer bench  Reasons for discharge: treatment goals met and discharge from hospital  Patient/family agrees with progress made and goals achieved: Yes  OT Discharge Precautions/Restrictions  Precautions Precautions: Fall Precaution Comments: R eye blindness Restrictions Weight Bearing Restrictions: No Vision/Perception  Vision- History Baseline Vision/History: Legally blind (R eye only) Patient Visual Report: Other (comment) (No change following CVA per pt report) Vision- Assessment Additional Comments: Pt with difficulty tracking L<>R  Cognition Overall Cognitive Status: Within Functional Limits for tasks assessed (Per caregiver, pt at baseline level) Arousal/Alertness: Awake/alert Orientation Level: Oriented to person;Oriented to place;Oriented to time;Disoriented to time Attention:  Selective Selective Attention: Appears intact Memory: Appears intact Awareness: Appears intact Problem Solving: Appears intact Safety/Judgment: Appears intact Sensation Sensation Light Touch: Impaired Detail Light Touch Impaired Details: Impaired RUE Stereognosis: Impaired Detail Stereognosis Impaired Details: Absent RUE Proprioception: Impaired by gross assessment Coordination Gross Motor Movements are Fluid and Coordinated: No Fine Motor Movements are Fluid and Coordinated: No Finger Nose Finger Test: Decreased accuracy B UEs R>L Motor  Motor Motor: Within Functional Limits Trunk/Postural Assessment  Cervical Assessment Cervical Assessment: Exceptions to [facility] (Forward head) Thoracic Assessment Thoracic Assessment: Exceptions to [facility] (Kyphotic) Lumbar Assessment Lumbar Assessment: Exceptions to [facility] (Posterior pelvic tilt)  Balance Balance Balance Assessed: Yes Static Sitting Balance Static Sitting - Balance Support: No upper extremity supported Static Sitting - Level of Assistance: 7: Independent Dynamic Sitting Balance Dynamic Sitting - Balance Support: No upper extremity supported;During functional activity Dynamic Sitting - Level of Assistance: 7: Independent Static Standing Balance Static Standing - Balance Support: No upper extremity supported;During functional activity Static Standing - Level of Assistance: 6: Modified independent (Device/Increase time) Dynamic Standing Balance Dynamic Standing - Balance Support: No upper extremity supported;During functional activity Dynamic Standing - Level of Assistance: 6: Modified independent (Device/Increase time);5: Stand by assistance Extremity/Trunk Assessment RUE Assessment RUE Assessment: Within Functional Limits (4+/5 throughout) LUE Assessment LUE Assessment: Within Functional Limits (5/5 throughout)   See Function Navigator for Current Functional Status.  Bobby Rumpf, Marajade Lei C 02/26/2017, 3:24 PM

## 2017-02-26 NOTE — Progress Notes (Addendum)
Patient Name: Homer Christopher Date of Encounter: 02/26/2017  Primary Cardiologist: Dr. [facility] - Closed Problem List     Principal Problem:   Arterial ischemic stroke, MCA, left, acute [facility]) Active Problems:   Adjustment disorder with mixed anxiety and depressed mood     Subjective   We were asked to see Mr. Garverick at the request of Dr. Wynn Banker for the evaluation of syncope.   No complaints. Only remembers on episode of syncope after getting off toilet. Felt hot at dizzy  Inpatient Medications    Scheduled Meds: . aspirin EC  325 mg Oral Daily  . atorvastatin  80 mg Oral q1800  . brimonidine  1 drop Both Eyes BID  . clopidogrel  75 mg Oral Daily  . dorzolamide-timolol  1 drop Right Eye BID  . enoxaparin (LOVENOX) injection  40 mg Subcutaneous Q24H  . folic acid  1 mg Oral Daily  . insulin aspart  0-20 Units Subcutaneous TID WC  . insulin aspart  0-5 Units Subcutaneous QHS  . insulin glargine  25 Units Subcutaneous Daily  . multivitamin with minerals  1 tablet Oral Daily  . polyethylene glycol  17 g Oral BID  . thiamine  100 mg Oral Daily   Continuous Infusions:  PRN Meds: acetaminophen, alum & mag hydroxide-simeth, bisacodyl, diphenhydrAMINE, guaiFENesin-dextromethorphan, prochlorperazine **OR** prochlorperazine **OR** prochlorperazine, senna-docusate, sodium phosphate, traZODone   Vital Signs    Vitals:   02/25/17 2305 02/26/17 0102 02/26/17 0455 02/26/17 0904  BP: 109/73 111/70 94/60 111/70  Pulse: (!) 58 61 61 71  Resp: 19 18 17 17   Temp: 98 F (36.7 C) 97.7 F (36.5 C) 98.1 F (36.7 C) 97.9 F (36.6 C)  TempSrc: Oral Oral Oral Oral  SpO2: 98% 100% 98% 100%  Weight:      Height:        Intake/Output Summary (Last 24 hours) at 02/26/17 1145 Last data filed at 02/26/17 0840  Gross per 24 hour  Intake             1260 ml  Output              250 ml  Net             1010 ml   Filed Weights   02/19/17 1501  Weight: 216 lb 1.6 oz (98 kg)     Physical Exam   GEN: Well nourished, well developed, in no acute distress.  HEENT: Grossly normal.  Neck: Supple, no JVD, carotid bruits, or masses. Cardiac: RRR, no murmurs, rubs, or gallops. No clubbing, cyanosis, edema.  Radials/DP/PT 2+ and equal bilaterally.  Respiratory:  Respirations regular and unlabored, clear to auscultation bilaterally. GI: Soft, nontender, nondistended, BS + x 4. MS: no deformity or atrophy. Skin: warm and dry, no rash. Psych: AAOx3.  Normal affect.  Labs    CBC  Recent Labs  02/26/17 0652  WBC 4.8  HGB 13.6  HCT 39.4  MCV 94.5  PLT 317   Basic Metabolic Panel  Recent Labs  02/26/17 0652  NA 137  K 3.5  CL 101  CO2 27  GLUCOSE 164*  BUN 12  CREATININE 0.81  CALCIUM 9.3   Liver Function Tests No results for input(s): AST, ALT, ALKPHOS, BILITOT, PROT, ALBUMIN in the last 72 hours. No results for input(s): LIPASE, AMYLASE in the last 72 hours. Cardiac Enzymes No results for input(s): CKTOTAL, CKMB, CKMBINDEX, TROPONINI in the last 72 hours. BNP Invalid input(s): POCBNP D-Dimer No results for  input(s): DDIMER in the last 72 hours. Hemoglobin A1C No results for input(s): HGBA1C in the last 72 hours. Fasting Lipid Panel No results for input(s): CHOL, HDL, LDLCALC, TRIG, CHOLHDL, LDLDIRECT in the last 72 hours. Thyroid Function Tests No results for input(s): TSH, T4TOTAL, T3FREE, THYROIDAB in the last 72 hours.  Invalid input(s): FREET3  Telemetry    NOT on tele  ECG    Normal sinus rhythm HR 68 ST & Marked T wave abnormality that is improved from previous Prolonged QT  - Personally Reviewed  Radiology    No results found.  Cardiac Studies   Echo 02/17/17 - Left ventricle: The cavity size was normal. Wall thickness was increased in a pattern of moderate LVH. Systolic function was vigorous. The estimated ejection fraction was in the range of 65% to 70%. Wall motion was normal; there were no regional  wall motion abnormalities. Doppler parameters are consistent with abnormal left ventricular relaxation (grade 1 diastolic dysfunction). - Aortic valve: Valve area (VTI): 3.57 cm^2. Valve area (Vmax): 3.12 cm^2. Valve area (Vmean): 2.85 cm^2. - Atrial septum: No defect or patent foramen ovale was identified. - Technically adequate study.  Patient Profile     56 y.o. male a history of HTN, HLD, diabetes, previous LV dysfunction, polysubstance abuse (cocaine use, ETOH, tobacco abuse), who presented to [facility] on 3/23 with altered mental status  and was found to significant dysarthria, expressive aphasia, and dense right hand/arm numbness. He was found to have a L MCA cortical stroke by MRI. We were called secondary to elevated troponin. He was eventually dishcarged to inpatient rehab and cardiology signed off. We were asked to re round today to evaluate a syncopal episode last night.    Assessment & Plan    Elevated troponin: on admission, he was found to have elevated troponin to 0.21 and new diffuse T wave inversions on EKG, although no chest pain, dyspnea, or palpitations. Cocaine positive UDS. Cardiology consulted and evaluated the patient. Serial troponins trended down and daily EKGs showed gradually improvement and normalization of T waves in some leads, although remains grossly abnormal. No chest pain or dyspnea, troponin and EKG changes could be attributed to acute CVA. Telemetry revealed no significant events. TTE revealed no new wall motion abnormalities.   Prolonged QT - EKG on presentation revealed QT prolongation to 570 ms and QTc 658 ms. All QT prolonging medications were avoided during hospital stay and serial EKGs showed mild improvement to low 500s  Substance abuse - reported smoking 0.5 packs per day of cigarettes and drinking 1 40oz beer daily on presentation, UDS also revealed recent cocaine use. Counseled patient on the significant need for cessation of all these  substances, as they likely contributed to his stroke. He was placed on CIWA protocol from 3/23 to 3/26 and developed no significant withdrawal symptoms and required no Ativan and this was discontinued on 3/26.   CVA: still has some aphasia and dysarthria. Feeling stronger with rehab  Syncopal episode: The patient felt hot and dizzy after standing up from using the toilet and then passed out.  Dr. Penni Homans suspected BP related , as well as intracranial microvascular disease and discontinued HCTZ and ACE-I allow BPs to run higher ~120-140. Apparently HR went into 40s by RN report, but not on tele. Could have been vagally mediated given he had just used the bathroom. Would not pursue further work up at this time. Agree with holding HCTZ and ACEi   Signed, Cline Crock, PA-C  02/26/2017,  11:45 AM   Personally seen and examined. Agree with above.  56 yo male with stroke and syncope episode in rehab.   Currently in chair, comfortable, RRR, CTAB, no edema, slightly altered speech.   Syncope - sounds like vasovagal syncope (hot, diaphoretic with decreased BP and pulse).   - Agree with plan --stopping ACE and HCT for now, allowing BP to run in 120-140 range.   - hydration   - Normal EF  - This does not sound like a QT mediated VT event. Personally viewed ECG with deep TWI. Improved from prior ECG. Troponin can also be elevated in stroke setting. Increases overall CV risk.   - Please let us know if we can be of further assistance.   Donato Schultz, MD

## 2017-02-26 NOTE — Progress Notes (Signed)
Physical Therapy Session Note  Patient Details  Name: Brady Church MRN: [id number] Date of Birth: February 10, 1961  Today's Date: 02/26/2017 PT Individual Time:  -      Short Term Goals: Week 1:  PT Short Term Goal 1 (Week 1): STG =LTG due to ELOS  Skilled Therapeutic Interventions/Progress Updates: Pt presented in chair agreeable to therapy. Ambulated around hospital up/down stairs (>24). Pt demonstrated safety neogotiating crowded areas of hospital with good safety and caregiver able to provide appropriate cues for door location and maintaining safety when necessary.  Performed all mobility, transfers, stairs with supervision to distant supervision. Performed obstacle course to reinforce safety with activities with caregiver present which pt was able to perform.  NuStep L4 x 10 min for endurance. BP after activities 124/84 with pt denying significant fatigue. Answered queries from caregiver regarding safety once home, pt and caregiver verbalized understanding. Pt returned to room and remained in recliner to eat lunch with caregiver present.      Therapy Documentation Precautions:  Precautions Precautions: Fall Precaution Comments: R eye blindness Restrictions Weight Bearing Restrictions: No General:   Vital Signs: Therapy Vitals Temp: 97.9 F (36.6 C) Temp Source: Oral Pulse Rate: 71 Resp: 17 BP: 111/70 Patient Position (if appropriate): Lying Oxygen Therapy SpO2: 100 % O2 Device: Not Delivered Pain: Pain Assessment Pain Score: 0-No pain        See Function Navigator for Current Functional Status.   Therapy/Group: Individual Therapy  Lynnea Vandervoort  Davine Coba, PTA  02/26/2017, 11:58 AM

## 2017-02-26 NOTE — Progress Notes (Signed)
Education on [employer] was done by using the insuline pen starter kit.RN explained how to measure the amount of insulin by listen and counting the clips on the pen.Then the RN explained how to attach the needle to the insuline pen.RN was unable to practice how to give the injection due to the fact that the pt. Is not able to visualized and inspect the side.Pt. Is unable to see on his right eye and his vision is impair on the left.Pt's friend was present and she said she will be the one that gives the insuline shots,she also verbalized that she uses insuline herself and demonstrated how to give the shots.Keep monitoring pt's needs closer.Marland Kitchen

## 2017-02-26 NOTE — Progress Notes (Signed)
Speech Language Pathology Discharge Summary  Patient Details  Name: Brady Church MRN: [id number] Date of Birth: 07/27/1961  Today's Date: 02/26/2017 SLP Individual Time: 1015-1100 SLP Individual Time Calculation (min): 45 min   Skilled Therapeutic Interventions:  Skilled treatment session focused on expressive communication goals. SLP facilitated session by providing Mod A phonemic and semantic for > 75% intelligibility at the phrase to sentence level. Pt with great increase in repetitive ability. Pt is self-aware of speech errors with Mod I and is able ot correct errors with 3 to 4 attempts at the phrase to sentence level. Pt with great expressive progress. Pt able to communicate questions about insulin pen with >90% intelligibility at the sentence/question level. Friend present and all education completed.     Patient has met 2 of 2 long term goals.  Patient to discharge at overall Mod level.  Reasons goals not met:     Clinical Impression/Discharge Summary:   Pt has made great progress towards ST goals as a result, pt has met all of his ST LTGs. Pt has surpassed current expressive communication goals and communicates with increased ability at the phrase and sentence level. 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 Pt requires skilled ST follow up to further advance expressive abilities.   Care Partner:  Caregiver Able to Provide Assistance: Yes  Type of Caregiver Assistance: Physical  Recommendation:  Outpatient SLP;Home Health SLP  Rationale for SLP Follow Up: Maximize functional communication;Maximize cognitive function and independence;Reduce caregiver burden   Equipment:     Reasons for discharge: Treatment goals met   Patient/Family Agrees with Progress Made and Goals Achieved: Yes   Function:    Cognition Comprehension Comprehension assist level: Understands complex 90% of the time/cues 10% of the time  Expression    Expression assist level: Expresses basic 75 - 89% of the time/requires cueing 10 - 24% of the time. Needs helper to occlude trach/needs to repeat words.  Social Interaction Social Interaction assist level: Interacts appropriately 90% of the time - Needs monitoring or encouragement for participation or interaction.  Problem Solving Problem solving assist level: Solves basic problems with no assist  Memory Memory assist level: Recognizes or recalls 90% of the time/requires cueing < 10% of the time   Celestine Bougie B. Rutherford Nail, M.S., CCC-SLP Speech-Language Pathologist  Brady Church 02/26/2017, 10:32 AM

## 2017-02-26 NOTE — Progress Notes (Signed)
At 2305, patient called for assistance to bathroom. Stood to void, turned to ambulate back to bed, complained of feeling, "hot", syncope episode, Maralyn Sago, NT assisted to floor. Immediately responsive to staff. Ambulated to sink, complained of feeling "hot" again, assisted to bed. Patient cold and clammy-BP 109/73, HR 58 O2 sat 98%, CBG-143. Denies SOB and CP. Patient reports having "hot spells", then "passing out". Paged Dr. Wynn Banker at [phone]. Warden Fillers, patient's friend and contact person, made aware of above events. Ortho vitals obtained this AM. Spoke with Dr. Wynn Banker this AM, EKG ordered and obtained. Day shift RN made aware. Alfredo Martinez A

## 2017-02-26 NOTE — Progress Notes (Signed)
Physical Therapy Discharge Summary  Patient Details  Name: Brady Church MRN: [id number] Date of Birth: December 26, 1960  Today's Date: 02/26/2017 PT Individual Time: 1100-1200 PT Individual Time Calculation (min): 60 min    Patient has met 7 of 7 long term goals due to improved activity tolerance, improved balance, improved attention, improved awareness and improved coordination.  Patient to discharge at an ambulatory level Supervision.   Patient's care partner is independent to provide the necessary physical and cognitive assistance at discharge.  Reasons goals not met: [name]  Recommendation:  Patient will benefit from ongoing skilled PT services in home health setting to continue to advance safe functional mobility, address ongoing impairments in balance, safety, strength, and minimize fall risk.  Equipment: No equipment provided  Reasons for discharge: treatment goals met  Patient/family agrees with progress made and goals achieved: Yes  PT Discharge Precautions/Restrictions Precautions Precautions: Fall Precaution Comments: R eye blindness Restrictions Weight Bearing Restrictions: No Vital Signs Therapy Vitals Temp: 99.1 F (37.3 C) Temp Source: Oral Pulse Rate: 68 Resp: 18 BP: 120/70 Patient Position (if appropriate): Sitting Oxygen Therapy SpO2: 99 % O2 Device: Not Delivered Pain   Vision/Perception  Vision - Assessment Additional Comments: Pt with difficulty tracking L<>R  Cognition Overall Cognitive Status: Within Functional Limits for tasks assessed (Per caregiver, pt at baseline level) Arousal/Alertness: Awake/alert Orientation Level: Oriented to person;Oriented to place;Oriented to time;Disoriented to time Attention: Selective Selective Attention: Appears intact Memory: Appears intact Awareness: Appears intact Problem Solving: Appears intact Safety/Judgment: Appears intact Sensation Sensation Light Touch: Impaired Detail Light Touch Impaired Details:  Impaired RUE Stereognosis: Impaired Detail Stereognosis Impaired Details: Absent RUE Proprioception: Impaired by gross assessment Coordination Gross Motor Movements are Fluid and Coordinated: No Fine Motor Movements are Fluid and Coordinated: No Finger Nose Finger Test: Decreased accuracy B UEs R>L Motor  Motor Motor: Within Functional Limits  Mobility   Locomotion  Ambulation Ambulation: Yes Ambulation/Gait Assistance: 7: Independent;5: Supervision Ambulation Distance (Feet): 500 Feet Assistive device: Other (Comment) (long cane (visually impaired)) Gait Gait: Yes Gait Pattern: Within Functional Limits Gait Pattern: Within Functional Limits Stairs / Additional Locomotion Stairs: Yes Stairs Assistance: 5: Supervision Stairs Assistance Details (indicate cue type and reason): use of probing cane and verbal cues Stair Management Technique: One rail Left Number of Stairs: 24 Height of Stairs: 6  Trunk/Postural Assessment  Cervical Assessment Cervical Assessment: Exceptions to [facility] (Forward head) Thoracic Assessment Thoracic Assessment: Exceptions to [facility] (Kyphotic) Lumbar Assessment Lumbar Assessment: Exceptions to [facility] (Posterior pelvic tilt)  Balance Balance Balance Assessed: Yes Static Sitting Balance Static Sitting - Balance Support: No upper extremity supported Static Sitting - Level of Assistance: 7: Independent Dynamic Sitting Balance Dynamic Sitting - Balance Support: No upper extremity supported;During functional activity Dynamic Sitting - Level of Assistance: 7: Independent Static Standing Balance Static Standing - Balance Support: No upper extremity supported;During functional activity Static Standing - Level of Assistance: 6: Modified independent (Device/Increase time) Dynamic Standing Balance Dynamic Standing - Balance Support: No upper extremity supported;During functional activity Dynamic Standing - Level of Assistance: 6: Modified independent  (Device/Increase time);5: Stand by assistance Dynamic Standing - Balance Activities: Reaching for objects Extremity Assessment  RUE Assessment RUE Assessment: Within Functional Limits (4+/5 throughout) LUE Assessment LUE Assessment: Within Functional Limits (5/5 throughout) RLE Assessment RLE Assessment: Within Functional Limits LLE Assessment LLE Assessment: Within Functional Limits   See Function Navigator for Current Functional Status.  Rosita DeChalus 02/26/2017, 4:34 PM

## 2017-02-26 NOTE — Progress Notes (Signed)
Occupational Therapy Session Note  Patient Details  Name: Brady Church MRN: [id number] Date of Birth: 1960-12-30  Today's Date: 02/26/2017 OT Individual Time: 0920-1015 and 1330-1400 OT Individual Time Calculation (min): 55 min and 30 min   Short Term Goals: Week 1:  OT Short Term Goal 1 (Week 1): STG=LTG due to LOS  Skilled Therapeutic Interventions/Progress Updates:    Session One: Pt seen for OT ADL bathing/dressing session. Pt sitting up in recliner upon arrival with girlfriend present. She reported having completing family education over the weekend and felt comfortable with assisting with shower transfers and as needed for pt.  Pt ambulated throughout room with supervision, occasional VCs for path finding due to impaired vision. He gathered clothing items and ambulated to ADL apartment. Completed bathing task seated on tub transfer bench, standing with supervision to complete buttock/ pericare hygiene and reached to feet to wash, no LOB episodes. He dressed seated on toilet with increased time. He gathered showering items, reaching to floor to pick up towel with supervision.  In therapy gym, pt completed fine motor activity focusing on in-hand manipulation. Pt displays difficulty with transition/transition for in hand movements. Educated pt and pt's girlfriend regarding functional implications of impaired sensation and ataxia in R UE including skin protection during ADL/IADL tasks. Pt ambulated back to room at end of session, left seated in recliner awaiting handoff to next discipline.  Pt and caregiver voiced feeling comfortable and confident with planned d/c home tomorrow.   Session Two: Pt seen for OT session focusing on ADL re-training and fine motor coordination. Pt sitting up in recliner upon arrival, voicing need for toileting task. He ambulated throughout room with supervision to complete toileting task and hand hygiene standing at the sink.  In therapy gym, pt sat EOM to  complete fine motor activities focusing on in-hand manipulation. Initially began with using poker chips, however due to decreased sensation/ perception and impaired vision pt unable to successfully complete task. However, with larger and more textured items, pt able to transfer item from palm <> finger tips with increased time.  Pt attempted writing name using R UE. He displayed difficulty maintaining grasp on pen and due to impaired vision wouly write past margins on paper and write on table. Educated throughout session regarding was to incorporate therapy into everyday tasks. Pt ambulated back to room at end of session, left seated in w/c with all needs in reach.   Therapy Documentation Precautions:  Precautions Precautions: Fall Precaution Comments: R eye blindness Restrictions Weight Bearing Restrictions: No Pain:   No/ denies pain  See Function Navigator for Current Functional Status.   Therapy/Group: Individual Therapy  Lewis, Jaedynn Bohlken C 02/26/2017, 7:17 AM

## 2017-02-26 NOTE — Progress Notes (Signed)
Subjective/Complaints:  Episodes of feeling hot and then being assisted to floor, no CP or SOB, states this happened at home but never saw MD, no shaking reported Discussed with RN, and pt, had visitor yesterday, no significant orthostatic drops although systolic was on low side ~130mmHg ACE I reduced 3/31  Review of systems cannot be obtained secondary to  aphasia and dysarthria  Objective: Vital Signs: Blood pressure 94/60, pulse 61, temperature 98.1 F (36.7 C), temperature source Oral, resp. rate 17, height 5\' 8"  (1.727 m), weight 98 kg (216 lb 1.6 oz), SpO2 98 %. No results found. Results for orders placed or performed during the hospital encounter of 02/19/17 (from the past 72 hour(s))  Glucose, capillary     Status: Abnormal   Collection Time: 02/23/17 12:03 PM  Result Value Ref Range   Glucose-Capillary 246 (H) 65 - 99 mg/dL   Comment 1 Notify RN   Glucose, capillary     Status: Abnormal   Collection Time: 02/23/17  4:47 PM  Result Value Ref Range   Glucose-Capillary 286 (H) 65 - 99 mg/dL   Comment 1 Notify RN   Glucose, capillary     Status: None   Collection Time: 02/23/17  9:27 PM  Result Value Ref Range   Glucose-Capillary 72 65 - 99 mg/dL  Glucose, capillary     Status: Abnormal   Collection Time: 02/23/17 10:29 PM  Result Value Ref Range   Glucose-Capillary 162 (H) 65 - 99 mg/dL  Glucose, capillary     Status: Abnormal   Collection Time: 02/24/17  6:51 AM  Result Value Ref Range   Glucose-Capillary 181 (H) 65 - 99 mg/dL  Glucose, capillary     Status: Abnormal   Collection Time: 02/24/17 12:06 PM  Result Value Ref Range   Glucose-Capillary 187 (H) 65 - 99 mg/dL   Comment 1 Notify RN   Glucose, capillary     Status: Abnormal   Collection Time: 02/24/17  4:38 PM  Result Value Ref Range   Glucose-Capillary 225 (H) 65 - 99 mg/dL   Comment 1 Notify RN   Glucose, capillary     Status: Abnormal   Collection Time: 02/24/17  9:06 PM  Result Value Ref Range   Glucose-Capillary 179 (H) 65 - 99 mg/dL  Glucose, capillary     Status: Abnormal   Collection Time: 02/25/17  7:18 AM  Result Value Ref Range   Glucose-Capillary 181 (H) 65 - 99 mg/dL  Glucose, capillary     Status: Abnormal   Collection Time: 02/25/17 12:27 PM  Result Value Ref Range   Glucose-Capillary 209 (H) 65 - 99 mg/dL   Comment 1 Document in Chart   Glucose, capillary     Status: Abnormal   Collection Time: 02/25/17  4:50 PM  Result Value Ref Range   Glucose-Capillary 178 (H) 65 - 99 mg/dL  Glucose, capillary     Status: Abnormal   Collection Time: 02/25/17  9:05 PM  Result Value Ref Range   Glucose-Capillary 166 (H) 65 - 99 mg/dL  Glucose, capillary     Status: Abnormal   Collection Time: 02/25/17 11:04 PM  Result Value Ref Range   Glucose-Capillary 143 (H) 65 - 99 mg/dL  Glucose, capillary     Status: Abnormal   Collection Time: 02/26/17  6:46 AM  Result Value Ref Range   Glucose-Capillary 163 (H) 65 - 99 mg/dL     HEENT: normal Cardio: RRR and No murmurs Resp: CTA B/L and Unlabored GI:  BS positive and Nontender and nondistended Extremity:  No Edema Skin:   Intact Neuro: Alert/Oriented, Normal Sensory, Abnormal Motor 4/5 in the right deltoid, biceps, triceps, grip, right hip flexion, knee extension and dorsiflexion5/5 in the left deltoid, biceps, triceps, grip, hip flexion, knee extension, ankle dorsiflexion, Abnormal FMC Ataxic/ dec FMC, Tone:  Increased flexor tone. Right upper extremity, Dysarthric, Aphasic, Inattention and Other Currently, impaired vision, left eye, moderately impaired vision, right eye Musc/Skel:  Other No pain with upper extremity or lower extremity active range of motion Gen. no acute distress   Assessment/Plan: 1. Functional deficits secondary to MCA distribution infarct with right hemiparesis, aphasia and dysarthria which require 3+ hours per day of interdisciplinary therapy in a comprehensive inpatient rehab setting. Physiatrist is  providing close team supervision and 24 hour management of active medical problems listed below. Physiatrist and rehab team continue to assess barriers to discharge/monitor patient progress toward functional and medical goals. FIM: Function - Bathing Position: Shower Body parts bathed by patient: Right arm, Left lower leg, Left arm, Back, Chest, Abdomen, Front perineal area, Buttocks, Right upper leg, Left upper leg, Right lower leg Assist Level: Set up  Function- Upper Body Dressing/Undressing What is the patient wearing?: Pull over shirt/dress Pull over shirt/dress - Perfomed by patient: Thread/unthread right sleeve, Thread/unthread left sleeve, Put head through opening, Pull shirt over trunk Assist Level: More than reasonable time Set up : To obtain clothing/put away Function - Lower Body Dressing/Undressing What is the patient wearing?: Underwear, Pants, Socks, Shoes Position: Sitting EOB Underwear - Performed by patient: Thread/unthread right underwear leg, Thread/unthread left underwear leg, Pull underwear up/down Pants- Performed by patient: Thread/unthread right pants leg, Thread/unthread left pants leg, Pull pants up/down Non-skid slipper socks- Performed by patient: Don/doff right sock, Don/doff left sock Shoes - Performed by patient: Don/doff right shoe, Don/doff left shoe, Fasten right, Fasten left Assist for footwear: Independent Assist for lower body dressing: Set up  Function - Toileting Toileting steps completed by patient: Adjust clothing prior to toileting, Performs perineal hygiene, Adjust clothing after toileting Toileting Assistive Devices: Grab bar or rail Assist level: Supervision or verbal cues  Function Programmer, multimedia transfer assistive device: Grab bar Assist level to toilet: Touching or steadying assistance (Pt > 75%) Assist level from toilet: Touching or steadying assistance (Pt > 75%)  Function - Chair/bed transfer Chair/bed transfer method:  Ambulatory Chair/bed transfer assist level: Supervision or verbal cues Chair/bed transfer assistive device: Cane Chair/bed transfer details: Verbal cues for precautions/safety, Verbal cues for sequencing  Function - Locomotion: Wheelchair Will patient use wheelchair at discharge?: No Function - Locomotion: Ambulation Assistive device: Cane-straight Max distance: 431ft  Assist level: Supervision or verbal cues Assist level: Supervision or verbal cues Assist level: Supervision or verbal cues Assist level: Supervision or verbal cues Assist level: Supervision or verbal cues  Function - Comprehension Comprehension: Auditory Comprehension assist level: Understands complex 90% of the time/cues 10% of the time  Function - Expression Expression: Verbal Expression assist level: Expresses basic 75 - 89% of the time/requires cueing 10 - 24% of the time. Needs helper to occlude trach/needs to repeat words.  Function - Social Interaction Social Interaction assist level: Interacts appropriately 90% of the time - Needs monitoring or encouragement for participation or interaction.  Function - Problem Solving Problem solving assist level: Solves basic problems with no assist  Function - Memory Memory assist level: Recognizes or recalls 90% of the time/requires cueing < 10% of the time Patient normally able to  recall (first 3 days only): Current season, Location of own room, Staff names and faces, That he or she is in a hospital  Medical Problem List and Plan: 1. Functional and language defiits secondary to left MCA infarct Cont CIR PT, OT, speech, target d/c 4/4  2. DVT Prophylaxis/Anticoagulation: Pharmaceutical: Lovenox 3. Pain Management: Tylenol prn HA 4. Mood: LCSW to follow for evaluation and support.  5. Neuropsych: This patient iscapable of making decisions on hisown behalf. 6. Skin/Wound Care: Routine pressure relief measures. Maintain adequate nutritional status 7.  Fluids/Electrolytes/Nutrition: Monitor I/O and BMET 85-100% meals 8. T2DM: Poorly controlled--had stopped taking metformin weeks ago. Hgb A1C- 14.4. Was on metformin at home and now on lantus--start education on insulin pen use. Will monitor BS ac/hs.continue lantus to 25u daily with SSI for elevated BS. CBG (last 3)   Recent Labs  02/25/17 2105 02/25/17 2304 02/26/17 0646  GLUCAP 166* 143* 163*   9. HTN: Monitor BP bid with permissive  D/c  HCTZ and prinivil-- Vitals:   02/26/17 0102 02/26/17 0455  BP: 111/70 94/60  Pulse: 61 61  Resp: 18 17  Temp: 97.7 F (36.5 C) 98.1 F (36.7 C)  10. Glaucoma: On alphagan and difluperdnate,  11. Hyponatremia: Question chronic due to diuretic. Recheck in am.  12. Dyslipidemia: Now on lipitor.  13.  Constipation miralax BID 14.  Syncopal episode suspect BP related , as well as intracranial microvascular disease d/c HCTZ and ACE-I allow BPs to run higher ~120-140, ask for cardiology to re eval given a low HR reported during episode by RN,  ~40BPM LOS (Days) 7 A FACE TO FACE EVALUATION WAS PERFORMED  KIRSTEINS,ANDREW E 02/26/2017, 7:29 AM

## 2017-02-27 DIAGNOSIS — R55 Syncope and collapse: Secondary | ICD-10-CM

## 2017-02-27 LAB — DRUG PROFILE, UR, 9 DRUGS (LABCORP)
Amphetamines, Urine: NEGATIVE ng/mL
Barbiturate, Ur: NEGATIVE ng/mL
Benzodiazepine Quant, Ur: NEGATIVE ng/mL
CANNABINOID QUANT UR: NEGATIVE ng/mL
COCAINE (METAB.): NEGATIVE ng/mL
METHADONE SCREEN, URINE: NEGATIVE ng/mL
OPIATE QUANT UR: NEGATIVE ng/mL
PHENCYCLIDINE, UR: NEGATIVE ng/mL
PROPOXYPHENE, URINE: NEGATIVE ng/mL

## 2017-02-27 LAB — GLUCOSE, CAPILLARY: Glucose-Capillary: 149 mg/dL — ABNORMAL HIGH (ref 65–99)

## 2017-02-27 MED ORDER — SENNOSIDES-DOCUSATE SODIUM 8.6-50 MG PO TABS: 1.0000 | ORAL_TABLET | Freq: Two times a day (BID) | ORAL | 0 refills | Status: DC

## 2017-02-27 MED ORDER — ASPIRIN 325 MG PO TBEC: 325.0000 mg | DELAYED_RELEASE_TABLET | Freq: Every day | ORAL | 0 refills | Status: DC

## 2017-02-27 MED ORDER — ADULT MULTIVITAMIN W/MINERALS CH: 1.0000 | ORAL_TABLET | Freq: Every day | ORAL | 0 refills | Status: DC

## 2017-02-27 MED ORDER — THIAMINE HCL 100 MG PO TABS: 100.0000 mg | ORAL_TABLET | Freq: Every day | ORAL | 0 refills | Status: DC

## 2017-02-27 MED ORDER — INSULIN PEN NEEDLE 32G X 6 MM MISC: 1.0000 "application " | Freq: Every day | 0 refills | Status: DC

## 2017-02-27 MED ORDER — FOLIC ACID 1 MG PO TABS: 1.0000 mg | ORAL_TABLET | Freq: Every day | ORAL | 0 refills | Status: DC

## 2017-02-27 MED ORDER — BLOOD GLUCOSE MONITOR KIT: PACK | 0 refills | Status: DC

## 2017-02-27 MED ORDER — ATORVASTATIN CALCIUM 80 MG PO TABS: 80.0000 mg | ORAL_TABLET | Freq: Every day | ORAL | 0 refills | Status: DC

## 2017-02-27 MED ORDER — INSULIN GLARGINE 100 UNIT/ML SOLOSTAR PEN: 25.0000 [IU] | PEN_INJECTOR | Freq: Every day | SUBCUTANEOUS | 0 refills | Status: DC

## 2017-02-27 MED ORDER — CLOPIDOGREL BISULFATE 75 MG PO TABS: 75.0000 mg | ORAL_TABLET | Freq: Every day | ORAL | 0 refills | Status: DC

## 2017-02-27 NOTE — Discharge Summary (Signed)
Physician Discharge Summary  Patient ID: Brady Church MRN: [id number] DOB/AGE: December 22, 1960 56 y.o.  Admit date: 02/19/2017 Discharge date: 02/27/2017  Discharge Diagnoses:  Principal Problem:   Arterial ischemic stroke, MCA, left, acute ([facility]) Active Problems:   Hypertension   Diabetes mellitus with diabetic cataract (HCC)   Prolonged QT interval   Adjustment disorder with mixed anxiety and depressed mood   Vasovagal syncope   Discharged Condition: stable  Significant Diagnostic Studies:   Labs:  Basic Metabolic Panel: BMP Latest Ref Rng & Units 02/26/2017 02/20/2017 02/19/2017  Glucose 65 - 99 mg/dL 164(H) 358(H) 232(H)  BUN 6 - 20 mg/dL _0 Creatinine 0.61 - 1.24 mg/dL 0.81 0.96 0.78  BUN/Creat Ratio 9 - 20 - - -  Sodium 135 - 145 mmol/L 137 131(L) 133(L)  Potassium 3.5 - 5.1 mmol/L 3.5 3.5 3.6  Chloride 101 - 111 mmol/L 101 96(L) 97(L)  CO2 22 - 32 mmol/L _1 Calcium 8.9 - 10.3 mg/dL 9.3 9.1 9.2    CBC: CBC Latest Ref Rng & Units 02/26/2017 02/20/2017 02/16/2017  WBC 4.0 - 10.5 K/uL 4.8 5.9 -  Hemoglobin 13.0 - 17.0 g/dL 13.6 14.2 13.9  Hematocrit 39.0 - 52.0 % 39.4 40.7 41.0  Platelets 150 - 400 K/uL 317 262 -    CBG:  Recent Labs Lab 02/26/17 0646 02/26/17 1206 02/26/17 1635 02/26/17 2058 02/27/17 0629  GLUCAP 163* 173* 199* 197* 149*     Brief HPI:   Brady Church a 56 y.o.malewith a history of T2DM, limited vision, CHF, polysubstance abuse;who presented on 02/16/17 with slurred speech, word finding deficits, right sided sensory changes, and staggering gait. MRI revealed an acute ischemic non-hemorrhagic posterior left MCA infarct. 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  UDS positive for cocaine. Cardiology consulted for input on elevated troponin and felt that elevation with non-diagnostic pattern and patient with chronic systolic HF--euvolemic. No further work up  needed. Dr. Erlinda Hong recommends ASA and plavix for 3 months followed by Plavix alone. Therapy evaluations revealed moderate to severe dysarthria with difficulty following multi-step commands, sensory deficits RUE affecting ability to carry out ADL tasks and substantial functional deficits. PM&R was recommended for rehab needs.    Hospital Course: Brady Church was admitted to rehab 02/19/2017 for inpatient therapies to consist of PT, ST and OT at least three hours five days a week. Past admission physiatrist, therapy team and rehab RN have worked together to provide customized collaborative inpatient rehab. Blood pressures were monitored on bid basis and have been reasonably controlled. He did have a syncopal episode after voiding on early am of 4/2.  HCTZ and prinivil were discontinued to allow BP to run higher due to intracranial microvascular disease. EKG done showing ST-T wave abnormality that was improved from previous EKG per cardiology. Patient with history of prolonged QT and EKG with mild improvement.   Dr. Etter Sjogren felt that episode could have been vagally mediated and no further work up indicated at this time.    Po intake has been good and he is continent of bowel and bladder. Nursing has worked with patient and his girlfriend on use of insulin pen.  He has also been educated on importance of medication compliance and cessation of alcohol/cocaine use. Girlfriend has been here daily and has involved in family education during his stay. He has made good progress during his rehab stay and has progressed to supervision level.  He will continue to receive follow up [place], [place], [facility], [place], [place] aide and HHSW by [place] after discharge.     Rehab course: During patient's stay in rehab weekly team conferences were held to monitor patient's progress, set goals and discuss barriers to discharge. At admission, patient required min assist with basic self care tasks and mobility.  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 He has had improvement in activity tolerance, balance, postural control, as well as ability to compensate for deficits. He is has had improvement in functional use RUE  as well as improvement in awareness. He is able to complete ADL tasks with supervision.  His safety awareness has improved and he is able to perform transfers and ambulate 500' with supervision and use of [place].  He is able to correct speech errors with 3-4 attempts at phrase level. He requires mod phonemic/semantic assist for > 75% intelligibility with speech.    Disposition: 01-Home or Self Care  Diet: Diabetic/Heart healthy.   Special Instructions: 1. Check blood sugars at least twice a day before meals.  2. Do not take any medications that are not on the list. Your medical doctor will adjust medications as needed.   Discharge Instructions    Ambulatory referral to Physical Medicine Rehab    Complete by:  As directed    1-2 weeks transitional care appt     Allergies as of 02/27/2017   No Known Allergies     Medication List    STOP taking these medications   acetaminophen 325 MG tablet Commonly known as:  TYLENOL   hydrochlorothiazide 25 MG tablet Commonly known as:  HYDRODIURIL   insulin aspart 100 UNIT/ML injection Commonly known as:  novoLOG   insulin glargine 100 UNIT/ML injection Commonly known as:  LANTUS Replaced by:  Insulin Glargine 100 UNIT/ML Solostar Pen   lisinopril 10 MG tablet Commonly known as:  PRINIVIL,ZESTRIL   metFORMIN 1000 MG tablet Commonly known as:  GLUCOPHAGE   Omeprazole 20 MG Tbec     TAKE these medications   ACCU-CHEK FASTCLIX LANCETS Misc 1 each by Does not apply route 2 (two) times daily.   aspirin 325 MG EC tablet Take 1 tablet (325 mg total) by mouth daily.   atorvastatin 80 MG tablet Commonly known as:  LIPITOR Take 1 tablet (80 mg total) by mouth daily at 6 PM.   blood  glucose meter kit and supplies Kit Dispense based on patient and insurance preference. Use up to four times daily as directed. (FOR ICD-9 250.00, 250.01).   brimonidine 0.2 % ophthalmic solution Commonly known as:  ALPHAGAN Place 1 drop into both eyes 2 (two) times daily.   clopidogrel 75 MG tablet Commonly known as:  PLAVIX Take 1 tablet (75 mg total) by mouth daily.   dorzolamide-timolol 22.3-6.8 MG/ML ophthalmic solution Commonly known as:  COSOPT Place 1 drop into the right eye 2 (two) times daily.   folic acid 1 MG tablet Commonly known as:  FOLVITE Take 1 tablet (1 mg total) by mouth daily.   glucose blood test strip Commonly known as:  ACCU-CHEK SMARTVIEW Use as instructed   Insulin Glargine 100 UNIT/ML Solostar Pen Commonly known as:  LANTUS Inject 25 Units into the skin daily with breakfast. Replaces:  insulin glargine 100 UNIT/ML injection   Insulin Pen Needle 32G X 6 MM Misc 1 application by Does not apply route daily with breakfast.   multivitamin  with minerals Tabs tablet Take 1 tablet by mouth daily. Start taking on:  02/28/2017   senna-docusate 8.6-50 MG tablet Commonly known as:  Senokot-S Take 1 tablet by mouth 2 (two) times daily. What changed:  when to take this  reasons to take this Notes to patient:  Can use Senna or Miralax twice a day for constipation--both are available over the counter.    thiamine 100 MG tablet Take 1 tablet (100 mg total) by mouth daily. Notes to patient:  Vitamins due to alcohol use      Follow-up Information    Charlett Blake, MD Follow up.   Specialty:  Physical Medicine and Rehabilitation Why:  office will call you with follow up appointment Contact information: [place] [address] [phone]        [place], MD. Call in 1 day(s).   Specialty:  Neurology Why:  for follow up appointment in 4 weeks Contact information: [address] [place]  [address] [phone]        Florinda Marker, MD Follow up on 03/05/2017.   Specialty:  Internal Medicine Why:  Appointment @ 10:45 Am Contact information: [place] [address] [phone]           Signed: Bary Leriche 02/27/2017, 6:33 PM

## 2017-02-27 NOTE — Discharge Instructions (Signed)
Inpatient Rehab Discharge Instructions  Brady Church Discharge date and time:  02/27/17  Activities/Precautions/ Functional Status: Activity: no lifting, driving, or strenuous exercise for till cleared by MD Diet: diabetic diet and heart healthy diet.  Wound Care: none needed   Functional status:  ___ No restrictions     ___ Walk up steps independently _X__ 24/7 supervision/assistance   ___ Walk up steps with assistance ___ Intermittent supervision/assistance  ___ Bathe/dress independently ___ Walk with walker     _X__ Bathe/dress with assistance ___ Walk Independently    ___ Shower independently ___ Walk with assistance    ___ Shower with assistance _X__ No alcohol/cocaine    ___ Return to work/school ________   Special Instructions: 1. Check blood sugars at least twice a day before meals.  2. Do not take any medications that are not on the list. Your medical doctor will adjust medications as needed.    COMMUNITY REFERRALS UPON DISCHARGE:    Home Health:   PT, OT ,SP, RN  Agency:[facility] Phone:[phone]   Date of last service:02/27/2017  Medical Equipment/Items Ordered:TUB BENCH  Agency/Supplier:[facility]   [phone] Other:DECLINED SUBSTANCE ABUSE SERVICES AT THIS TIME  GENERAL COMMUNITY RESOURCES FOR PATIENT/FAMILY: Support Groups:[facility] SUPPORT GROUP EVERY SECOND Thursday @ 3:00-4:00 PM ON THE REHAB UNIT QUESTIONS CONTACT [place] [phone]  STROKE/TIA DISCHARGE INSTRUCTIONS SMOKING Cigarette smoking nearly doubles your risk of having a stroke & is the single most alterable risk factor  If you smoke or have smoked in the last 12 months, you are advised to quit smoking for your health.  Most of the excess cardiovascular risk related to smoking disappears within a year of stopping.  Ask you doctor about anti-smoking medications  [place] Quit Line: 1-800-QUIT NOW  Free Smoking Cessation Classes [phone]  CHOLESTEROL Know your levels; limit fat  & cholesterol in your diet  Lipid Panel     Component Value Date/Time   CHOL 225 (H) 02/17/2017 0448   TRIG 174 (H) 02/17/2017 0448   HDL 31 (L) 02/17/2017 0448   CHOLHDL 7.3 02/17/2017 0448   VLDL 35 02/17/2017 0448   LDLCALC 159 (H) 02/17/2017 0448      Many patients benefit from treatment even if their cholesterol is at goal.  Goal: Total Cholesterol (CHOL) less than 160  Goal:  Triglycerides (TRIG) less than 150  Goal:  HDL greater than 40  Goal:  LDL (LDLCALC) less than 100   BLOOD PRESSURE American Stroke Association blood pressure target is less that 120/80 mm/Hg  Your discharge blood pressure is:  BP: 121/78  Monitor your blood pressure  Limit your salt and alcohol intake  Many individuals will require more than one medication for high blood pressure  DIABETES (A1c is a blood sugar average for last 3 months) Goal HGBA1c is under 7% (HBGA1c is blood sugar average for last 3 months)  Diabetes:     Lab Results  Component Value Date   HGBA1C 14.4 (H) 02/17/2017     Your HGBA1c can be lowered with medications, healthy diet, and exercise.  Check your blood sugar as directed by your physician  Call your physician if you experience unexplained or low blood sugars.  PHYSICAL ACTIVITY/REHABILITATION Goal is 30 minutes at least 4 days per week  Activity: No driving, Therapies: see above Return to work: [name]  Activity decreases your risk of heart attack and stroke and makes your heart stronger.  It helps control your weight and blood pressure; helps you relax and  can improve your mood.  Participate in a regular exercise program.  Talk with your doctor about the best form of exercise for you (dancing, walking, swimming, cycling).  DIET/WEIGHT Goal is to maintain a healthy weight  Your discharge diet is: Diet heart healthy/carb modified Room service appropriate? Yes; Fluid consistency: Thin liquids Your height is:  Height: 5\' 8"  (172.7 cm) Your current weight is:  Weight: 98 kg (216 lb 1.6 oz) Your Body Mass Index (BMI) is:  BMI (Calculated): 32.9  Following the type of diet specifically designed for you will help prevent another stroke.  Your goal weight is: 164 lbs  Your goal Body Mass Index (BMI) is 19-24.  Healthy food habits can help reduce 3 risk factors for stroke:  High cholesterol, hypertension, and excess weight.  RESOURCES Stroke/Support Group:  Call [phone]   STROKE EDUCATION PROVIDED/REVIEWED AND GIVEN TO PATIENT Stroke warning signs and symptoms How to activate emergency medical system (call 911). Medications prescribed at discharge. Need for follow-up after discharge. Personal risk factors for stroke. Pneumonia vaccine given:  Flu vaccine given:  My questions have been answered, the writing is legible, and I understand these instructions.  I will adhere to these goals & educational materials that have been provided to me after my discharge from the hospital.     My questions have been answered and I understand these instructions. I will adhere to these goals and the provided educational materials after my discharge from the hospital.  Patient/Caregiver Signature _______________________________ Date __________  Clinician Signature _______________________________________ Date __________  Please bring this form and your medication list with you to all your follow-up doctor's appointments.

## 2017-02-27 NOTE — Progress Notes (Signed)
Subjective/Complaints:  Appreciate cardiology note No further spells Asked about insulin pen and glucometer   Review of systems no dizziness no CP or SOB  Objective: Vital Signs: Blood pressure 121/78, pulse 72, temperature 98.7 F (37.1 C), temperature source Oral, resp. rate 18, height _0  (1.727 m), weight 98 kg (216 lb 1.6 oz), SpO2 99 %. No results found. Results for orders placed or performed during the hospital encounter of 02/19/17 (from the past 72 hour(s))  Glucose, capillary     Status: Abnormal   Collection Time: 02/24/17  6:51 AM  Result Value Ref Range   Glucose-Capillary 181 (H) 65 - 99 mg/dL  Glucose, capillary     Status: Abnormal   Collection Time: 02/24/17 12:06 PM  Result Value Ref Range   Glucose-Capillary 187 (H) 65 - 99 mg/dL   Comment 1 Notify RN   Glucose, capillary     Status: Abnormal   Collection Time: 02/24/17  4:38 PM  Result Value Ref Range   Glucose-Capillary 225 (H) 65 - 99 mg/dL   Comment 1 Notify RN   Glucose, capillary     Status: Abnormal   Collection Time: 02/24/17  9:06 PM  Result Value Ref Range   Glucose-Capillary 179 (H) 65 - 99 mg/dL  Glucose, capillary     Status: Abnormal   Collection Time: 02/25/17  7:18 AM  Result Value Ref Range   Glucose-Capillary 181 (H) 65 - 99 mg/dL  Glucose, capillary     Status: Abnormal   Collection Time: 02/25/17 12:27 PM  Result Value Ref Range   Glucose-Capillary 209 (H) 65 - 99 mg/dL   Comment 1 Document in Chart   Glucose, capillary     Status: Abnormal   Collection Time: 02/25/17  4:50 PM  Result Value Ref Range   Glucose-Capillary 178 (H) 65 - 99 mg/dL  Glucose, capillary     Status: Abnormal   Collection Time: 02/25/17  9:05 PM  Result Value Ref Range   Glucose-Capillary 166 (H) 65 - 99 mg/dL  Glucose, capillary     Status: Abnormal   Collection Time: 02/25/17 11:04 PM  Result Value Ref Range   Glucose-Capillary 143 (H) 65 - 99 mg/dL  Glucose, capillary     Status: Abnormal    Collection Time: 02/26/17  6:46 AM  Result Value Ref Range   Glucose-Capillary 163 (H) 65 - 99 mg/dL  Basic metabolic panel     Status: Abnormal   Collection Time: 02/26/17  6:52 AM  Result Value Ref Range   Sodium 137 135 - 145 mmol/L   Potassium 3.5 3.5 - 5.1 mmol/L   Chloride 101 101 - 111 mmol/L   CO2 27 22 - 32 mmol/L   Glucose, Bld 164 (H) 65 - 99 mg/dL   BUN 12 6 - 20 mg/dL   Creatinine, Ser 0.81 0.61 - 1.24 mg/dL   Calcium 9.3 8.9 - 10.3 mg/dL   GFR calc non Af Amer >60 >60 mL/min   GFR calc Af Amer >60 >60 mL/min    Comment: (NOTE) The eGFR has been calculated using the CKD EPI equation. This calculation has not been validated in all clinical situations. eGFR's persistently <60 mL/min signify possible Chronic Kidney Disease.    Anion gap 9 5 - 15  CBC     Status: Abnormal   Collection Time: 02/26/17  6:52 AM  Result Value Ref Range   WBC 4.8 4.0 - 10.5 K/uL   RBC 4.17 (L) 4.22 - 5.81 MIL/uL  Hemoglobin 13.6 13.0 - 17.0 g/dL   HCT 39.4 39.0 - 52.0 %   MCV 94.5 78.0 - 100.0 fL   MCH 32.6 26.0 - 34.0 pg   MCHC 34.5 30.0 - 36.0 g/dL   RDW 14.4 11.5 - 15.5 %   Platelets 317 150 - 400 K/uL  Glucose, capillary     Status: Abnormal   Collection Time: 02/26/17 12:06 PM  Result Value Ref Range   Glucose-Capillary 173 (H) 65 - 99 mg/dL  Glucose, capillary     Status: Abnormal   Collection Time: 02/26/17  4:35 PM  Result Value Ref Range   Glucose-Capillary 199 (H) 65 - 99 mg/dL  Glucose, capillary     Status: Abnormal   Collection Time: 02/26/17  8:58 PM  Result Value Ref Range   Glucose-Capillary 197 (H) 65 - 99 mg/dL  Glucose, capillary     Status: Abnormal   Collection Time: 02/27/17  6:29 AM  Result Value Ref Range   Glucose-Capillary 149 (H) 65 - 99 mg/dL     HEENT: normal Cardio: RRR and No murmurs Resp: CTA B/L and Unlabored GI: BS positive and Nontender and nondistended Extremity:  No Edema Skin:   Intact Neuro: Alert/Oriented, Normal Sensory, Abnormal  Motor 4/5 in the right deltoid, biceps, triceps, grip, right hip flexion, knee extension and dorsiflexion5/5 in the left deltoid, biceps, triceps, grip, hip flexion, knee extension, ankle dorsiflexion, Abnormal FMC Ataxic/ dec FMC, Tone:  Increased flexor tone. Right upper extremity, Dysarthric, Aphasic, Inattention and Other Currently, impaired vision, left eye, moderately impaired vision, right eye Musc/Skel:  Other No pain with upper extremity or lower extremity active range of motion Gen. no acute distress   Assessment/Plan: 1. Functional deficits secondary to MCA distribution infarct with right hemiparesis, aphasia and dysarthria Stable for D/C today F/u PCP in 3-4 weeks F/u PM&R 2 weeks See D/C summary See D/C instructions FIM: Function - Bathing Position: Shower Body parts bathed by patient: Right arm, Left lower leg, Left arm, Back, Chest, Abdomen, Front perineal area, Buttocks, Right upper leg, Left upper leg, Right lower leg Assist Level: Supervision or verbal cues  Function- Upper Body Dressing/Undressing What is the patient wearing?: Pull over shirt/dress Pull over shirt/dress - Perfomed by patient: Thread/unthread right sleeve, Thread/unthread left sleeve, Put head through opening, Pull shirt over trunk Assist Level: More than reasonable time Set up : To obtain clothing/put away Function - Lower Body Dressing/Undressing What is the patient wearing?: Underwear, Pants, Non-skid slipper socks Position: Other (comment) (Sitting on toilet) Underwear - Performed by patient: Thread/unthread right underwear leg, Thread/unthread left underwear leg, Pull underwear up/down Pants- Performed by patient: Thread/unthread right pants leg, Thread/unthread left pants leg, Pull pants up/down Non-skid slipper socks- Performed by patient: Don/doff right sock, Don/doff left sock Shoes - Performed by patient: Don/doff right shoe, Don/doff left shoe, Fasten right, Fasten left Assist for footwear:  Independent Assist for lower body dressing: More than reasonable time  Function - Toileting Toileting steps completed by patient: Adjust clothing prior to toileting, Performs perineal hygiene, Adjust clothing after toileting Toileting Assistive Devices: Grab bar or rail Assist level: More than reasonable time  Function - Air cabin crew transfer assistive device: Grab bar Assist level to toilet: No Help, no cues, assistive device, takes more than a reasonable amount of time Assist level from toilet: No Help, no cues, assistive device, takes more than a reasonable amount of time  Function - Chair/bed transfer Chair/bed transfer method: Ambulatory Chair/bed transfer assist level:  No Help, no cues, assistive device, takes more than a reasonable amount of time Chair/bed transfer assistive device: Cane Chair/bed transfer details: Verbal cues for precautions/safety, Verbal cues for sequencing  Function - Locomotion: Wheelchair Will patient use wheelchair at discharge?: No Function - Locomotion: Ambulation Assistive device: Cane-straight Max distance: 553f Assist level: Supervision or verbal cues Assist level: Supervision or verbal cues Assist level: Supervision or verbal cues Assist level: Supervision or verbal cues Assist level: Supervision or verbal cues  Function - Comprehension Comprehension: Auditory Comprehension assist level: Follows basic conversation/direction with no assist  Function - Expression Expression: Verbal Expression assist level: Expresses basic 50 - 74% of the time/requires cueing 25 - 49% of the time. Needs to repeat parts of sentences.  Function - Social Interaction Social Interaction assist level: Interacts appropriately 75 - 89% of the time - Needs redirection for appropriate language or to initiate interaction.  Function - Problem Solving Problem solving assist level: Solves basic 50 - 74% of the time/requires cueing 25 - 49% of the  time  Function - Memory Memory assist level: Recognizes or recalls 75 - 89% of the time/requires cueing 10 - 24% of the time Patient normally able to recall (first 3 days only): Current season, Location of own room, Staff names and faces, That he or she is in a hospital  Medical Problem List and Plan: 1. Functional and language defiits secondary to left MCA infarct ,stable for D/C2. DVT Prophylaxis/Anticoagulation: Pharmaceutical: Lovenox 3. Pain Management: Tylenol prn HA 4. Mood: LCSW to follow for evaluation and support.  5. Neuropsych: This patient iscapable of making decisions on hisown behalf. 6. Skin/Wound Care: Routine pressure relief measures. Maintain adequate nutritional status 7. Fluids/Electrolytes/Nutrition: Monitor I/O and BMET 85-100% meals 8. T2DM: Poorly controlled--had stopped taking metformin weeks ago. Hgb A1C- 14.4. Was on metformin at home and now on lantus--start education on insulin pen use. Will monitor BS ac/hs.continue lantus to 25u daily with SSI for elevated BS. CBG (last 3)   Recent Labs  02/26/17 1635 02/26/17 2058 02/27/17 0629  GLUCAP 199* 197* 149*   9. HTN: Monitor BP bid with permissive  D/c  HCTZ and prinivil--BPs climbing to desired range Vitals:   02/26/17 2138 02/27/17 0500  BP: 112/62 121/78  Pulse: (!) 56 72  Resp: 18 18  Temp: 98.1 F (36.7 C) 98.7 F (37.1 C)  10. Glaucoma: On alphagan and difluperdnate,  11. Hyponatremia: Question chronic due to diuretic.  12. Dyslipidemia: Now on lipitor.  13.  Constipation miralax BID 14.  Syncopal episode suspect BP related , as well as intracranial microvascular disease d/c HCTZ and ACE-I allow BPs to run higher ~120-140, ask for cardiology to re eval given a low HR reported during episode by RN,  ~40BPM LOS (Days) 8 A FACE TO FACE EVALUATION WAS PERFORMED  Dozier Berkovich E 02/27/2017, 6:46 AM

## 2017-02-27 NOTE — Progress Notes (Signed)
Pt. Got d/c instructions,pt. Ready to go home with his friend.

## 2017-02-27 NOTE — Progress Notes (Signed)
Social Work  Discharge Note  The overall goal for the admission was met for:   Discharge location: [place]  Length of Stay: Yes-8 DAYS  Discharge activity level: Yes-SUPERVISION LEVEL  Home/community participation: Yes  Services provided included: MD, RD, PT, OT, SLP, RN, CM, Pharmacy and SW  Financial Services: Medicaid  Follow-up services arranged: Home Health: [place] CARE-PT,OT,SP,AIDE,SW, DME: [facility] and Patient/Family has no preference for HH/DME agencies  Comments (or additional information):LILA WAS HERE DAILY AND PARTICIPATED IN THERAPIES WITH HIM. SHE WILL PROVIDE 24 HR SUPERVISION AT DISCHARGE UNTIL PT IS ABLE TO RETURN TO HIS OWN HOME.  Patient/Family verbalized understanding of follow-up arrangements: Yes  Individual responsible for coordination of the follow-up plan: SELF & LILA-FRIEND  Confirmed correct DME delivered: Elease Hashimoto 02/27/2017    Elease Hashimoto

## 2017-02-28 ENCOUNTER — Telehealth: Payer: Self-pay

## 2017-02-28 ENCOUNTER — Telehealth: Payer: Self-pay | Admitting: Internal Medicine

## 2017-02-28 ENCOUNTER — Ambulatory Visit: Payer: Medicaid Other | Admitting: Podiatry

## 2017-02-28 NOTE — Telephone Encounter (Signed)
Spoke to sig other, explained medicines and why BP was not reordered at this time, reminded of appt in [facility] and neuro f/u, she will make sure pt comes in 4/9

## 2017-02-28 NOTE — Telephone Encounter (Signed)
Has question for nurse about his med

## 2017-02-28 NOTE — Telephone Encounter (Signed)
Pt's caregiver Called again about medications.  She would like to know what medications he should be taking as he was just in the hospital recently   Please call back as soon as possible.

## 2017-02-28 NOTE — Telephone Encounter (Signed)
Transitional Care call  Patient name: Brady Church     DOB: 12-06-1960  1. Are you/is patient experiencing any problems since coming home? no a. Are there any questions regarding any aspect of care? no 2. Are there any questions regarding medications administration/dosing? no a. Are meds being taken as prescribed? yes b. "Patient should review meds with caller to confirm" 3. Have there been any falls? no 4. Has Home Health been to the house and/or have they contacted you? yes a. If not, have you tried to contact them? na b. Can we help you contact them? na 5. Are bowels and bladder emptying properly? yes a. Are there any unexpected incontinence issues? na b. If applicable, is patient following bowel/bladder programs? na 6. Any fevers, problems with breathing, unexpected pain? no 7. Are there any skin problems or new areas of breakdown? no 8. Has the patient/family member arranged specialty MD follow up (ie cardiology/neurology/renal/surgical/etc.)?  yes a. Can we help arrange? no 9. Does the patient need any other services or support that we can help arrange? no 10. Are caregivers following through as expected in assisting the patient? yes 11. Has the patient quit smoking, drinking alcohol, or using drugs as recommended? na  Appointment time 930am / 03/08/17, arrive time 900am, and who it is with here Dr. Wynn Banker [address]

## 2017-02-28 NOTE — Telephone Encounter (Signed)
Please see other note from today.

## 2017-03-05 ENCOUNTER — Telehealth: Payer: Self-pay

## 2017-03-05 ENCOUNTER — Ambulatory Visit (INDEPENDENT_AMBULATORY_CARE_PROVIDER_SITE_OTHER): Payer: Medicaid Other | Admitting: Internal Medicine

## 2017-03-05 VITALS — BP 135/88 | HR 71 | Temp 98.2°F | Ht 68.0 in | Wt 222.0 lb

## 2017-03-05 DIAGNOSIS — I10 Essential (primary) hypertension: Secondary | ICD-10-CM | POA: Diagnosis not present

## 2017-03-05 DIAGNOSIS — F17210 Nicotine dependence, cigarettes, uncomplicated: Secondary | ICD-10-CM | POA: Diagnosis not present

## 2017-03-05 DIAGNOSIS — Z79899 Other long term (current) drug therapy: Secondary | ICD-10-CM

## 2017-03-05 DIAGNOSIS — Z8673 Personal history of transient ischemic attack (TIA), and cerebral infarction without residual deficits: Secondary | ICD-10-CM

## 2017-03-05 DIAGNOSIS — I69322 Dysarthria following cerebral infarction: Secondary | ICD-10-CM

## 2017-03-05 DIAGNOSIS — Z794 Long term (current) use of insulin: Secondary | ICD-10-CM | POA: Diagnosis not present

## 2017-03-05 DIAGNOSIS — E1136 Type 2 diabetes mellitus with diabetic cataract: Secondary | ICD-10-CM

## 2017-03-05 DIAGNOSIS — R208 Other disturbances of skin sensation: Secondary | ICD-10-CM | POA: Diagnosis not present

## 2017-03-05 DIAGNOSIS — I69398 Other sequelae of cerebral infarction: Secondary | ICD-10-CM | POA: Diagnosis not present

## 2017-03-05 MED ORDER — LISINOPRIL 40 MG PO TABS: 40.0000 mg | ORAL_TABLET | Freq: Every day | ORAL | 2 refills | Status: DC

## 2017-03-05 MED ORDER — METFORMIN HCL 1000 MG PO TABS: 1000.0000 mg | ORAL_TABLET | Freq: Two times a day (BID) | ORAL | 2 refills | Status: DC

## 2017-03-05 NOTE — Assessment & Plan Note (Signed)
His diabetes is severely uncontrolled with last A1c of 14.4. Patient has been off of his metformin since discharge from the hospital as he thought this was discontinued. There is no reason why metformin should be discontinued at this time. Will have patient restart metformin 1000 mg twice daily and continue Lantus 25 units at bedtime. He will test his blood glucose 3 times daily and bring his glucometer to his next visit. If his blood sugars are elevated throughout the day then can consider mealtime NovoLog. He will need more aggressive diabetes control given his recent stroke. He will follow up in 1 month.

## 2017-03-05 NOTE — Assessment & Plan Note (Addendum)
BP remains elevated at 135/88. He has not been on any BP medications since discharge from the hospital. Will have him restart lisinopril 40 mg daily. Will have him follow up in 1 month.

## 2017-03-05 NOTE — Progress Notes (Signed)
   CC: Hospital follow-up for stroke  HPI:  Mr.Brady Church is a 56 y.o. man with past medical history as noted below who presents today for hospital follow-up after having a left MCA cortical stroke.  History of left MCA stroke: He was hospitalized from 3/23-4/3 after presenting with altered mental status and found to have significant dysarthria, expressive aphasia, and a dense right hand/arm numbness. MRI confirmed a posterior left MCA cortical stroke. Patient then went to inpatient rehabilitation. Today, he reports his speech is getting much better. He still has some right hand/arm numbness, but this is also improving. He reports compliance with his aspirin, Plavix, and atorvastatin. He denies any new neurological symptoms, including vision changes, new weakness, or new tingling/numbness.   HTN: BP today is 135/88. He reports he ran out of his blood pressure medication and has not been on any for the past week. He is supposed to be on lisinopril 40 mg daily.  Type 2 diabetes: A1c in the hospital was found to be elevated at 14.4. Patient has not been taking his metformin as he states this was stopped in the hospital. He is taking Lantus 25 units at bedtime. He is not on any mealtime insulin. He brought in his glucometer today and most sugar readings are in the 150s. Patient reports he has difficulty checking his blood sugar as he is legally blind.  Past Medical History:  Diagnosis Date  . 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 Diabetes mellitus   . High cholesterol   . Hypertension   . Substance abuse    cocaine. Stopped in 2010  . Tobacco use disorder 10/07/2014    Review of Systems:   General: Denies fever, chills, night sweats, changes in weight, changes in appetite HEENT: Denies headaches, ear pain, rhinorrhea, sore throat CV: Denies CP, palpitations, SOB, orthopnea Pulm: Denies SOB, cough, wheezing GI: Denies abdominal pain, nausea, vomiting, diarrhea,  constipation, melena, hematochezia GU: Denies dysuria, hematuria, frequency Msk: Denies muscle cramps, joint pains Neuro: See HPI Skin: Denies rashes, bruising Psych: Denies depression, anxiety, hallucinations  Physical Exam:  Vitals:   03/05/17 0955  BP: 135/88  Pulse: 71  Temp: 98.2 F (36.8 C)  TempSrc: Oral  SpO2: 100%  Weight: 222 lb (100.7 kg)  Height: 5\' 8"  (1.727 m)   General: Well-nourished man, NAD HEENT: [place]/AT, EOMI, PERRL, blind bilaterally, left pupil with cataract opacity, mucus membranes moist CV: RRR, no m/g/r Pulm: CTA bilaterally, breaths non-labored Ext: warm, no peripheral edema Neuro: alert and oriented x 3. He has some word-finding difficulties and speech mildly dysarthric.Tongue midline. Shoulder shrug intact. Strength 5/5 in upper and lower extremities bilaterally. Sensation to light touch is decreased on the right arm, hand, and leg compared to the left side. Gait normal.   Assessment & Plan:   See Encounters Tab for problem based charting.  Patient discussed with Dr. Josem Kaufmann

## 2017-03-05 NOTE — Progress Notes (Signed)
Case discussed with Dr. Rivet soon after the resident saw the patient. We reviewed the resident's history and exam and pertinent patient test results. I agree with the assessment, diagnosis, and plan of care documented in the resident's note. 

## 2017-03-05 NOTE — Telephone Encounter (Signed)
Aurea Graff from [facility] requesting VO. Please call pt back.

## 2017-03-05 NOTE — Patient Instructions (Addendum)
General Instructions: - Restart Metformin 1000 mg twice daily - Restart Lisinopril 40 mg daily  - Follow up in 1 month for blood pressure recheck and diabetes follow up  Thank you for bringing your medicines today. This helps [place] keep you safe from mistakes.   Progress Toward Treatment Goals:  Treatment Goal 03/18/2015  Stop smoking smoking the same amount    Self Care Goals & Plans:  Self Care Goal 09/20/2016  Manage my medications take my medicines as prescribed; refill my medications on time; bring my medications to every visit  Monitor my health keep track of my blood glucose; keep track of my blood pressure; bring my glucose meter and log to each visit; check my feet daily  Eat healthy foods eat more vegetables; eat foods that are low in salt; eat baked foods instead of fried foods  Be physically active find an activity I enjoy  Stop smoking cut down the number of cigarettes smoked; set a quit date and stop smoking  Prevent falls -  Meeting treatment goals -    No flowsheet data found.   Care Management & Community Referrals:  Referral 03/18/2015  Referrals made for care management support nutritionist  Referrals made to community resources none

## 2017-03-05 NOTE — Assessment & Plan Note (Addendum)
Patient here for hospital follow-up after having a left MCA infarct. He completed 1 week of inpatient rehab as well. His speech and right hand numbness are improving per patient and his family member that was present. He still has some word-finding difficulties and dysarthria on exam. Advised him to continue ASA, Plavix, and Atorvastatin 80 mg daily.

## 2017-03-06 ENCOUNTER — Telehealth: Payer: Self-pay | Admitting: Internal Medicine

## 2017-03-06 NOTE — Telephone Encounter (Signed)
I attempted to call Mr. Brady Church at his home today to discuss his concerns surrounding his diabetic medications. His significant other reached out to my attending stating they would like to speak with me. Unfortunately, I was unable to get a hold of Mr. Brady Church and there was no voicemail. Patient should take his Metformin 1000 mg BID and Lantus 25 units QHS per recent clinic note by Dr. Beckie Salts on 03/05/17. I will attempt to contact Mr. Brady Church at a later time.   Karlene Lineman, DO PGY-3 Internal Medicine Resident Pager # [phone] 03/06/2017 10:52 AM

## 2017-03-07 ENCOUNTER — Telehealth: Payer: Self-pay | Admitting: *Deleted

## 2017-03-07 NOTE — Telephone Encounter (Signed)
Allen Derry, Speech Pathologist, [facility] called asking for verbal orders 1week3 to addess expresve aphasia, expressive apraxia, and dysarthria.  I contacted speech pathologist and gave verbal orders per office protocol

## 2017-03-07 NOTE — Telephone Encounter (Signed)
Lm for rtc 

## 2017-03-08 ENCOUNTER — Encounter: Payer: Self-pay | Admitting: Physical Medicine & Rehabilitation

## 2017-03-08 ENCOUNTER — Encounter: Payer: Medicaid Other | Attending: Physical Medicine & Rehabilitation

## 2017-03-08 ENCOUNTER — Ambulatory Visit (HOSPITAL_BASED_OUTPATIENT_CLINIC_OR_DEPARTMENT_OTHER): Payer: Medicaid Other | Admitting: Physical Medicine & Rehabilitation

## 2017-03-08 VITALS — BP 123/81 | HR 67 | Resp 14

## 2017-03-08 DIAGNOSIS — I69351 Hemiplegia and hemiparesis following cerebral infarction affecting right dominant side: Secondary | ICD-10-CM | POA: Diagnosis not present

## 2017-03-08 DIAGNOSIS — I69320 Aphasia following cerebral infarction: Secondary | ICD-10-CM | POA: Diagnosis not present

## 2017-03-08 DIAGNOSIS — R269 Unspecified abnormalities of gait and mobility: Secondary | ICD-10-CM | POA: Diagnosis not present

## 2017-03-08 DIAGNOSIS — E785 Hyperlipidemia, unspecified: Secondary | ICD-10-CM | POA: Insufficient documentation

## 2017-03-08 DIAGNOSIS — I10 Essential (primary) hypertension: Secondary | ICD-10-CM | POA: Diagnosis not present

## 2017-03-08 DIAGNOSIS — E119 Type 2 diabetes mellitus without complications: Secondary | ICD-10-CM | POA: Diagnosis not present

## 2017-03-08 DIAGNOSIS — Z5189 Encounter for other specified aftercare: Secondary | ICD-10-CM | POA: Insufficient documentation

## 2017-03-08 DIAGNOSIS — I69398 Other sequelae of cerebral infarction: Secondary | ICD-10-CM

## 2017-03-08 NOTE — Progress Notes (Signed)
Subjective:    Patient ID: Brady Church, male    DOB: 1961-03-12, 56 y.o.   MRN: [id number] TRANSITIONAL CARE FOLLOWING cir ADMISSION Admit date: 02/19/2017 Discharge date: 02/27/2017 Hospitalized for L MCA infarct causing Right hemiparesis and aphasia Phone call 4/4 for Transitional care documented HPI Bathing and dressing ModI DME:  Uses shower chair, walking stick for blind HH therapy has come out Pt feels his biggest problem is speech and tingling in fingers and arm Patient has had some improvement in mobility since discharge, but speech is about the same. He lives with his girlfriend, who provides 24 7 supervision  Pain Inventory Average Pain 5 Pain Right Now 5 My pain is tingling  In the last 24 hours, has pain interfered with the following? General activity 0 Relation with others 0 Enjoyment of life 0 What TIME of day is your pain at its worst? varies Sleep (in general) Good  Pain is worse with: unsure Pain improves with: medication Relief from Meds: 6  Mobility walk with assistance use a cane how many minutes can you walk? 30 ability to climb steps?  yes do you drive?  no  Function disabled: date disabled . I need assistance with the following:  meal prep, household duties and shopping  Neuro/Psych bowel control problems tingling anxiety  Prior Studies hospital follow up  Physicians involved in your care hospital follow up   Family History  Problem Relation Age of Onset  . Coronary artery disease Mother 20    died of MI  . Cancer Father 42    Some GI cancer. Not sure if it was Colon Cancer or not.  . Hypertension Father   . Diabetes Maternal Grandmother    Social History   Social History  . Marital status: Single    Spouse name: [name]  . Number of children: [name]  . Years of education: 40   Social History Main Topics  . Smoking status: Current Every Day Smoker    Packs/day: 0.50    Types: Cigarettes    Last attempt to quit: 01/16/2006    . Smokeless tobacco: Never Used     Comment: About 10 cigs daily. Stopped 2 weeks ago   . Alcohol use 0.0 oz/week     Comment: Beer- 40 oz. every day.  . Drug use: Yes    Types: Cocaine     Comment: Previous cocaine user. Stopped 2 years before.  Marland Kitchen Sexual activity: Not Asked   Other Topics Concern  . None   Social History Narrative   Lives in [place] for last 10 years with "friend"   Is not married and does not have kids.   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   Past Surgical History:  Procedure Laterality Date  . CATARACT EXTRACTION  01/08/2012   Right eye  . EYE SURGERY     x 4 total    Past Medical History:  Diagnosis Date  . 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 Diabetes mellitus   . High cholesterol   . Hypertension   . Substance abuse    cocaine. Stopped in 2010  . Tobacco use disorder 10/07/2014   BP 123/81   Pulse 67   Resp 14   SpO2 96%   Opioid Risk Score:   Fall Risk Score:  `1  Depression screen PHQ 2/9  Depression screen  [facility] 2/9 03/05/2017 10/11/2016 09/20/2016 07/10/2016 07/05/2016 06/05/2016 05/23/2016  Decreased Interest 0 0 0 0 0 0 0  Down, Depressed, Hopeless 0 0 1 0 0 0 0  PHQ - 2 Score 0 0 1 0 0 0 0    Review of Systems  Constitutional: Positive for unexpected weight change.  HENT: Negative.   Eyes: Negative.   Respiratory: Negative.   Cardiovascular: Negative.   Gastrointestinal: Positive for constipation.  Endocrine:       High blood sugar  Genitourinary: Negative.   Musculoskeletal: Negative.   Skin: Positive for rash.  Allergic/Immunologic: Negative.   Neurological: Negative.        Tingling  Hematological: Negative.   Psychiatric/Behavioral: The patient is nervous/anxious.   All other systems reviewed and are negative.      Objective:   Physical Exam  Constitutional: He is oriented to person, place, and time. He appears  well-developed and well-nourished.  HENT:  Head: Normocephalic and atraumatic.  Eyes:  Severe impairment but can ID fingers at 2 foot range  Cardiovascular: Normal rate, regular rhythm and normal heart sounds.   Pulmonary/Chest: Effort normal and breath sounds normal. No respiratory distress. He has no wheezes.  Abdominal: Soft. Bowel sounds are normal. He exhibits no distension. There is no tenderness.  Neurological: He is alert and oriented to person, place, and time. A sensory deficit is present. Gait abnormal.  Uses blind cane  Sensation mildly diminshed in RUE with reduced fine motor skills 4+ RUE 5/5 LUE  5/5 in BLE    Skin: Skin is warm and dry.  Psychiatric: He has a normal mood and affect.          Assessment & Plan:  1.  L MCA infarct primarily with RUE weakness and aphasia,Recommend continued home health, PM&R follow-up in one month at which point, anticipate transfer to outpatient therapy  Follow up with neurology, continue aspirin 325 mg per day and Plavix 75 mg per day  2. Hypertension, currently controlled. Lisinopril 40 mg per day Follow up with primary care physician  3. Diabetes. Continue Lantus 25 units with breakfast, metformin 1000 mg twice a day Follow up with primary care physician  4.  Hyperlipidemia. Continue Lipitor 80 mg per day

## 2017-03-08 NOTE — Patient Instructions (Signed)
See me in 1 month

## 2017-03-16 ENCOUNTER — Ambulatory Visit: Payer: Medicaid Other | Admitting: Podiatry

## 2017-03-19 ENCOUNTER — Telehealth: Payer: Self-pay | Admitting: *Deleted

## 2017-03-19 NOTE — Telephone Encounter (Signed)
Occupational therapist left a message asking for verbal orders to extend OT visits for 4 more visits 2week2.  I contacted the occupational therapist and gave verbal orders per office protocol

## 2017-03-21 ENCOUNTER — Ambulatory Visit: Payer: Medicaid Other | Admitting: Podiatry

## 2017-03-27 ENCOUNTER — Telehealth: Payer: Self-pay | Admitting: *Deleted

## 2017-03-27 NOTE — Telephone Encounter (Signed)
[facility] is calling for orders for 1wk3 .  Orders given.

## 2017-03-28 ENCOUNTER — Telehealth: Payer: Self-pay

## 2017-03-28 ENCOUNTER — Other Ambulatory Visit: Payer: Self-pay | Admitting: Physical Medicine and Rehabilitation

## 2017-03-28 ENCOUNTER — Other Ambulatory Visit: Payer: Self-pay

## 2017-03-28 MED ORDER — THIAMINE HCL 100 MG PO TABS: 100.0000 mg | ORAL_TABLET | Freq: Every day | ORAL | 11 refills | Status: DC

## 2017-03-28 MED ORDER — FOLIC ACID 1 MG PO TABS: 1.0000 mg | ORAL_TABLET | Freq: Every day | ORAL | 11 refills | Status: DC

## 2017-03-28 MED ORDER — CLOPIDOGREL BISULFATE 75 MG PO TABS: 75.0000 mg | ORAL_TABLET | Freq: Every day | ORAL | 11 refills | Status: DC

## 2017-03-28 MED ORDER — ATORVASTATIN CALCIUM 80 MG PO TABS: 80.0000 mg | ORAL_TABLET | Freq: Every day | ORAL | 11 refills | Status: DC

## 2017-03-28 NOTE — Telephone Encounter (Signed)
Patient called to request refill on atorvastatin, folic acid, thiamine, plavix

## 2017-03-28 NOTE — Telephone Encounter (Signed)
4 scripts sent today, spoke w/ pt and called and verified w/ [facility], they are ready for pick up, tried to call pt back got vmail, lm for rtc

## 2017-03-28 NOTE — Telephone Encounter (Signed)
Needs to speak with a nurse about meds.  

## 2017-03-30 ENCOUNTER — Encounter: Payer: Self-pay | Admitting: Podiatry

## 2017-03-30 ENCOUNTER — Ambulatory Visit (INDEPENDENT_AMBULATORY_CARE_PROVIDER_SITE_OTHER): Payer: Medicaid Other | Admitting: Podiatry

## 2017-03-30 DIAGNOSIS — M79676 Pain in unspecified toe(s): Secondary | ICD-10-CM

## 2017-03-30 DIAGNOSIS — E119 Type 2 diabetes mellitus without complications: Secondary | ICD-10-CM

## 2017-03-30 DIAGNOSIS — B351 Tinea unguium: Secondary | ICD-10-CM | POA: Diagnosis not present

## 2017-03-30 NOTE — Progress Notes (Signed)
Complaint:  Visit Type: Patient returns to my office for continued preventative foot care services. Complaint: Patient states" my nails have grown long and thick and become painful to walk and wear shoes" Patient has been diagnosed with DM with no foot complications. The patient presents for preventative foot care services. No changes to ROS  Podiatric Exam: Vascular: dorsalis pedis and posterior tibial pulses are palpable bilateral. Capillary return is immediate. Temperature gradient is WNL. Skin turgor WNL  Sensorium: Normal Semmes Weinstein monofilament test. Normal tactile sensation bilaterally. Nail Exam: Pt has thick disfigured discolored nails with subungual debris noted bilateral entire nail hallux through fifth toenails Ulcer Exam: There is no evidence of ulcer or pre-ulcerative changes or infection. Orthopedic Exam: Muscle tone and strength are WNL. No limitations in general ROM. No crepitus or effusions noted. Foot type and digits show no abnormalities. Bony prominences are unremarkable. Skin: No Porokeratosis. No infection or ulcers  Diagnosis:  Onychomycosis, , Pain in right toe, pain in left toes  Treatment & Plan Procedures and Treatment: Consent by patient was obtained for treatment procedures. The patient understood the discussion of treatment and procedures well. All questions were answered thoroughly reviewed. Debridement of mycotic and hypertrophic toenails, 1 through 5 bilateral and clearing of subungual debris. No ulceration, no infection noted.  Return Visit-Office Procedure: Patient instructed to return to the office for a follow up visit 3 months for continued evaluation and treatment.    Tuleen Mandelbaum DPM 

## 2017-04-02 ENCOUNTER — Other Ambulatory Visit: Payer: Self-pay | Admitting: Dietician

## 2017-04-02 ENCOUNTER — Ambulatory Visit (INDEPENDENT_AMBULATORY_CARE_PROVIDER_SITE_OTHER): Payer: Medicaid Other | Admitting: Dietician

## 2017-04-02 ENCOUNTER — Ambulatory Visit (INDEPENDENT_AMBULATORY_CARE_PROVIDER_SITE_OTHER): Payer: Medicaid Other | Admitting: Internal Medicine

## 2017-04-02 VITALS — BP 130/85 | HR 66 | Temp 98.7°F | Wt 227.3 lb

## 2017-04-02 DIAGNOSIS — Z713 Dietary counseling and surveillance: Secondary | ICD-10-CM

## 2017-04-02 DIAGNOSIS — E1136 Type 2 diabetes mellitus with diabetic cataract: Secondary | ICD-10-CM

## 2017-04-02 DIAGNOSIS — F17211 Nicotine dependence, cigarettes, in remission: Secondary | ICD-10-CM | POA: Diagnosis not present

## 2017-04-02 DIAGNOSIS — Z8673 Personal history of transient ischemic attack (TIA), and cerebral infarction without residual deficits: Secondary | ICD-10-CM | POA: Diagnosis present

## 2017-04-02 DIAGNOSIS — F1011 Alcohol abuse, in remission: Secondary | ICD-10-CM | POA: Diagnosis not present

## 2017-04-02 DIAGNOSIS — F1010 Alcohol abuse, uncomplicated: Secondary | ICD-10-CM

## 2017-04-02 DIAGNOSIS — E118 Type 2 diabetes mellitus with unspecified complications: Secondary | ICD-10-CM | POA: Diagnosis not present

## 2017-04-02 DIAGNOSIS — F17200 Nicotine dependence, unspecified, uncomplicated: Secondary | ICD-10-CM

## 2017-04-02 LAB — GLUCOSE, CAPILLARY: Glucose-Capillary: 95 mg/dL (ref 65–99)

## 2017-04-02 NOTE — Progress Notes (Signed)
   CC: Medication management HPI: Mr. Ahlijah Kobus is a 56 y.o. male with a h/o of hypertension, stable angina, combined chronic heart failure, polysubstance abuse and CVA who presents for medication management.  Review of Systems: He endorses improvement in his strength and speech. He denies chest pain or shortness of breath.  Physical Exam: Vitals:   04/02/17 0938  BP: 130/85  Pulse: 66  Temp: 98.7 F (37.1 C)  TempSrc: Oral  SpO2: 100%  Weight: 227 lb 4.8 oz (103.1 kg)   General appearance: alert and cooperative Head: Normocephalic, without obvious abnormality, atraumatic Lungs: clear to auscultation bilaterally Heart: regular rate and rhythm, S1, S2 normal, no murmur, click, rub or gallop Abdomen: soft, non-tender; bowel sounds normal; no masses,  no organomegaly Extremities: extremities normal, atraumatic, no cyanosis or edema  Assessment & Plan:  See encounters tab for problem based medical decision making. Patient discussed with Dr. Cleda Daub  Signed: Thomasene Lot, MD 04/02/2017, 11:07 AM  Pager: [phone]

## 2017-04-02 NOTE — Telephone Encounter (Signed)
request a meter that patient can use and is covered by [facility] Medicaid.

## 2017-04-02 NOTE — Progress Notes (Signed)
Diabetes Self-Management Education  Visit Type:  Follow-up  Appt. Start Time: 1130 Appt. End Time: 1200  04/02/2017  Mr. Brady Church, identified by name and date of birth, is a 56 y.o. male with a diagnosis of Diabetes:  Marland Kitchen  Type 2 diabetes  ASSESSMENT  Mr. Dacy is here today with a friend. He requested assistance with obtaining a meter that he can use himself ( wants larger strips) and is covered by his insurance company. He has Medicaid of [place]- they cover accu-chek meters. We tried having him use the accu chek guide meter  At today's visit without success. Previously he had been getting help with self monitoring, but now is trying to do this independently. He has a talking meter [facility] that is also difficult for him to get the blood on the strip with his limited vision and is not reimbursed by [facility] Medicaid. However, if this meter works for hime may be able to get an exception/PA. He had no strips at today's visit to be abel to try it. He has a magnifying glass at home. His friend says she will try to help him learn how to use it to be abel to check his own blood sugars.         Diabetes Self-Management Education - 04/02/17 1600      Health Coping   How would you rate your overall health? Good     Psychosocial Assessment   Patient Belief/Attitude about Diabetes Motivated to manage diabetes   Self-care barriers Impaired vision;Lack of transportation;Lack of material resources   Self-management support Doctor's office;Friends;CDE visits   Patient Concerns Monitoring   Special Needs Large print;Verbal instruction   Preferred Learning Style Hands on   Learning Readiness Ready     Pre-Education Assessment   Patient understands monitoring blood glucose, interpreting and using results Needs Review     Complications   Last HgB A1C per patient/outside source 14.4 %     Exercise   Exercise Type ADL's;Light (walking / raking leaves)   How many days per week to you exercise? 6    How many minutes per day do you exercise? 30   Total minutes per week of exercise 180     Patient Education   Previous Diabetes Education Yes (please comment)   Monitoring Taught/evaluated SMBG meter.     Individualized Goals (developed by patient)   Monitoring  test my blood glucose as discussed;Other (comment)     Outcomes   Program Status Not Completed     Subsequent Visit   Since your last visit have you continued or begun to take your medications as prescribed? Yes   Since your last visit have you had your blood pressure checked? Yes   Is your most recent blood pressure lower, unchanged, or higher since your last visit? Unchanged   Since your last visit have you experienced any weight changes? Loss   Weight Loss (lbs) 14   Since your last visit, are you checking your blood glucose at least once a day? No      Learning Objective:  Patient will have a greater understanding of diabetes self-management. Patient education plan is to attend individual and/or group sessions per assessed needs and concerns. My plan to support myself in continuing these changes to care for my diabetes is to attend or contact:   Other ? Add other local support resources -doctor's office, CDE, Dietitian, pharmacist, church     Plan:   Patient Instructions  PICK up  the Accu chek AVIVA plus meter, strips and lancets  from [facility].   Remember to keep you thumb near where you prick your finger.  Use your thumb as a guide to where to put the strip.  See if the strips are easier for you to use.   Bring this meter and the strips with you to your next appointments.   Also bring strips for yor old meter if you have some.     Expected Outcomes:  Demonstrated interest in learning. Expect positive outcomes  Education material provided: yes  If problems or questions, patient to contact team via:  Phone  Future DSME appointment: - 4-6 wks2-4 weeks  Plyler, [name], RD 04/02/2017 4:44 PM.

## 2017-04-02 NOTE — Patient Instructions (Addendum)
PICK up the Accu chek AVIVA plus meter, strips and lancets  from [facility].   Remember to keep you thumb near where you prick your finger.  Use your thumb as a guide to where to put the strip.  See if the strips are easier for you to use.   Bring this meter and the strips with you to your next appointments.   Also bring strips for yor old meter if you have some.

## 2017-04-02 NOTE — Assessment & Plan Note (Signed)
The patient presents for medication questions following left MCA stroke. He is currently on aspirin and clopidogrel. He will continue to dual anti-platelet therapy for 3 months. He has follow-up with neurology this week. He is taking all of his medications appropriately. His rehabilitation is going well. His strength and speech have improved. All questions were answered today. He will continue medical therapy. -- Clopidogrel plus aspirin for 3 months, follow-up with neurology -- Continue management for diabetes and blood pressure

## 2017-04-02 NOTE — Assessment & Plan Note (Signed)
The patient has quit smoking. He has not smoked since his stroke.

## 2017-04-02 NOTE — Assessment & Plan Note (Signed)
The patient has quit drinking. He has not drank since his stroke. He was given prescriptions for thiamine and folate supplementation. He will finish these prescriptions and then we'll start a daily over-the-counter multivitamin.

## 2017-04-02 NOTE — Patient Instructions (Signed)
It was a pleasure seeing you today. Thank you for choosing [facility] for your healthcare needs.   You are doing a great job taking your medications and with your diabetes.  Please continue to take your medications as prescribed.  Please make sure you follow up with neurology.  Please continue to take the over-the-counter multivitamin. You do not need a prescription for thiamine. When you finish the folic acid  prescription you do not need a refill.  It is great that you have stopped smoking and drinking. These are the best things you can do to improve your health.

## 2017-04-03 ENCOUNTER — Telehealth: Payer: Self-pay

## 2017-04-03 DIAGNOSIS — I639 Cerebral infarction, unspecified: Secondary | ICD-10-CM

## 2017-04-03 MED ORDER — GLUCOSE BLOOD VI STRP: ORAL_STRIP | 12 refills | Status: DC

## 2017-04-03 MED ORDER — ACCU-CHEK AVIVA PLUS W/DEVICE KIT: PACK | 0 refills | Status: DC

## 2017-04-03 MED ORDER — ACCU-CHEK FASTCLIX LANCETS MISC: 12 refills | Status: DC

## 2017-04-03 NOTE — Telephone Encounter (Signed)
Brady Church [phone]) SP [facility] Requesting verbal orders for Social Worker Consult to help patient receive certain resources

## 2017-04-03 NOTE — Telephone Encounter (Signed)
Ok for SW consult

## 2017-04-03 NOTE — Telephone Encounter (Signed)
Order placed in Epic.

## 2017-04-03 NOTE — Telephone Encounter (Signed)
Requesting home health aide. Please call pt back.

## 2017-04-04 NOTE — Progress Notes (Signed)
Internal Medicine Clinic Attending  Case discussed with Dr. Taylor at the time of the visit.  We reviewed the resident's history and exam and pertinent patient test results.  I agree with the assessment, diagnosis, and plan of care documented in the resident's note. 

## 2017-04-04 NOTE — Telephone Encounter (Signed)
Contacted Revonda Standard and relayed message

## 2017-04-06 ENCOUNTER — Ambulatory Visit (INDEPENDENT_AMBULATORY_CARE_PROVIDER_SITE_OTHER): Payer: Medicaid Other | Admitting: Nurse Practitioner

## 2017-04-06 ENCOUNTER — Other Ambulatory Visit: Payer: Self-pay | Admitting: *Deleted

## 2017-04-06 ENCOUNTER — Encounter: Payer: Self-pay | Admitting: Nurse Practitioner

## 2017-04-06 VITALS — BP 126/86 | HR 66 | Ht 68.0 in | Wt 226.0 lb

## 2017-04-06 DIAGNOSIS — E785 Hyperlipidemia, unspecified: Secondary | ICD-10-CM | POA: Diagnosis not present

## 2017-04-06 DIAGNOSIS — I639 Cerebral infarction, unspecified: Secondary | ICD-10-CM

## 2017-04-06 DIAGNOSIS — I10 Essential (primary) hypertension: Secondary | ICD-10-CM | POA: Diagnosis not present

## 2017-04-06 DIAGNOSIS — Z8673 Personal history of transient ischemic attack (TIA), and cerebral infarction without residual deficits: Secondary | ICD-10-CM

## 2017-04-06 DIAGNOSIS — R4781 Slurred speech: Secondary | ICD-10-CM | POA: Diagnosis not present

## 2017-04-06 NOTE — Progress Notes (Signed)
I have read the note, and I agree with the clinical assessment and plan.  Nikol Lemar A. Sylena Lotter, MD, PhD Certified in Neurology, Clinical Neurophysiology, Sleep Medicine, Pain Medicine and Neuroimaging  [facility] [address], [address] [place], [place] [address] [phone]  

## 2017-04-06 NOTE — Patient Instructions (Signed)
Stressed the importance of management of risk factors to prevent further stroke Continue Plavix and Aspirin for 3 months then Plavix alone secondary stroke prevention(May 22, 2017) Maintain strict control of hypertension with blood pressure goal below 130/90, today's reading126/86 continue antihypertensive medications Control of diabetes with hemoglobin A1c below 6.5 followed by primary care most recent hemoglobin A1c14.4 continue diabetic medications Cholesterol with LDL cholesterol less than 70, followed by primary care,  most recent 159 continue statin drugs Lipitor Continue in-home therapies then home exercise program eat healthy diet with whole grains,  fresh fruits and vegetables Will make referral for a 30 day event monitor Follow up 6 months

## 2017-04-06 NOTE — Progress Notes (Signed)
[facility]  PATIENT: Brady Church DOB: [birth year]   REASON FOR VISIT: Hospital follow-up for posterior left MCA territory infarct HISTORY FROM:patient and c/g Lila     HISTORY OF PRESENT ILLNESS:Brady Church a 56 y.o.malewith a history of T2DM, limited vision, CHF, polysubstance abuse;who presented on 02/16/17 with slurred speech, word finding deficits, right sided sensory changes, and staggering gait. MRI revealed an acute ischemic non-hemorrhagic posterior left MCA infarct. 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  UDS positive for cocaine. Cardiology consulted for input on elevated troponin and felt that elevation with non-diagnostic pattern and patient with chronic systolic HF--euvolemic. No further work up needed. Dr. Erlinda Hong recommends ASA and plavix for 3 months followed by Plavix alone. Therapy evaluations revealed moderate to severe dysarthria with difficulty following multi-step commands, sensory deficits RUE affecting ability to carry out ADL tasks and substantial functional deficits. PM&R was recommended for rehab needs he returns to the stroke clinic today for hospital follow-up . He is currently on aspirin and Plavix secondary stroke prevention with no bruising and bleeding. After 3 months he can just take Plavix. He has not had further stroke or TIA symptoms since discharge. He is currently on Lipitor for hyperlipidemia without myalgias. LDL 159. His caregiver took his blood sugar this morning it was 90 he continues on insulin. Hemoglobin A1c in the hospital 14.4 He is getting home health speech therapy and physical therapy. He denies any further cocaine abuse. He returns for reevaluation. 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 ACCU-CHEK FASTCLIX LANCETS MISC Check blood sugar 3 times a day 102 each 12  . aspirin 325 MG EC tablet Take 1 tablet (325 mg total) by mouth daily. 100 tablet 0  . atorvastatin (LIPITOR) 80 MG tablet Take 1 tablet (80 mg total) by mouth daily at 6 PM. 30 tablet 11  . blood glucose meter kit and supplies KIT Dispense based on patient and insurance preference. Use up to four times daily as directed. (FOR ICD-9 250.00, 250.01). 1 each 0  . Blood Glucose Monitoring Suppl (ACCU-CHEK AVIVA PLUS) w/Device KIT Check blood sugar 3 times a day 1 kit 0  . brimonidine (ALPHAGAN) 0.2 % ophthalmic solution Place 1 drop into both eyes 2 (two) times daily. 5 mL 12  . clopidogrel (PLAVIX) 75 MG tablet Take 1 tablet (75 mg total) by mouth daily. 30 tablet 11  . dorzolamide-timolol (COSOPT) 22.3-6.8 MG/ML ophthalmic solution Place 1 drop into the right eye 2 (two) times daily. 10 mL 3  . folic acid (FOLVITE) 1 MG tablet Take 1 tablet (1 mg total) by mouth daily. 30 tablet 11  . glucose blood (ACCU-CHEK AVIVA PLUS) test strip Check blood sugar 3 times a day as instructed 100 each 12  . Insulin Glargine (LANTUS) 100 UNIT/ML Solostar Pen Inject 25 Units into the skin daily with breakfast. 15 mL 0  . Insulin Pen Needle 32G X 6 MM MISC 1  application by Does not apply route daily with breakfast. 100 each 0  . lisinopril (PRINIVIL,ZESTRIL) 40 MG tablet Take 1 tablet (40 mg total) by mouth daily. 30 tablet 2  . metFORMIN (GLUCOPHAGE) 1000 MG tablet Take 1 tablet (1,000 mg total) by mouth 2 (two) times daily with a meal.  60 tablet 2  . Multiple Vitamin (MULTIVITAMIN WITH MINERALS) TABS tablet Take 1 tablet by mouth daily. 100 tablet 0  . senna-docusate (SENOKOT-S) 8.6-50 MG tablet Take 1 tablet by mouth 2 (two) times daily. 30 tablet 0  . thiamine 100 MG tablet Take 1 tablet (100 mg total) by mouth daily. 30 tablet 11   No facility-administered medications prior to visit.     PAST MEDICAL HISTORY: Past Medical History:  Diagnosis Date  . 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 Diabetes mellitus   . High cholesterol   . Hypertension   . Substance abuse    cocaine. Stopped in 2010  . Tobacco use disorder 10/07/2014    PAST SURGICAL HISTORY: Past Surgical History:  Procedure Laterality Date  . CATARACT EXTRACTION  01/08/2012   Right eye  . EYE SURGERY     x 4 total     FAMILY HISTORY: Family History  Problem Relation Age of Onset  . Coronary artery disease Mother 52       died of MI  . Cancer Father 44       Some GI cancer. Not sure if it was Colon Cancer or not.  . Hypertension Father   . Diabetes Maternal Grandmother     SOCIAL HISTORY: Social History   Social History  . Marital status: Single    Spouse name: [name]  . Number of children: [name]  . Years of education: 20   Occupational History  . Not on file.   Social History Main Topics  . Smoking status: Former Smoker    Packs/day: 0.50    Types: Cigarettes    Quit date: 02/18/2017  . Smokeless tobacco: Never Used     Comment: About 10 cigs daily. Stopped 2 weeks ago   . Alcohol use No     Comment: Beer- 40 oz. every day.  . Drug use: No     Comment: Previous cocaine user. Stopped 2 years before.  Marland Kitchen Sexual activity: Not on file   Other Topics Concern  . Not on file   Social History Narrative   Lives in [place] for last 10 years with "friend"  [name]      Is not married and does not have kids.   Working sporadically as a Horticulturist, commercial.    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     PHYSICAL EXAM  Vitals:   04/06/17 0940  BP: 126/86  Pulse: 66  Weight: 226 lb (102.5 kg)  Height: '5\' 8"'$  (1.727 m)   Body mass index is 34.36 kg/m.  Generalized: Well developed,Obese male in no acute distress  Head: normocephalic and atraumatic,. Oropharynx benign  Neck: Supple, no carotid bruits  Cardiac: Regular rate rhythm, no murmur  Musculoskeletal: No deformity   Neurological examination   Mentation: Alert  Attention span and concentration appropriate. Language shows partial expressive aphasia with intermittent word finding difficulty severe dysarthria. Able to follow simple commands Cranial nerve II-XII: . extraocular movements not complete right gaze still left gaze preference  visual field were limited on confrontational test. Left eye blind  Facial sensation and strength were normal. hearing was intact to finger rubbing bilaterally. Uvula tongue midline. head turning and shoulder shrug were normal and symmetric.Tongue protrusion into cheek strength was normal. Motor: normal bulk and tone, full strength in the BUE, BLE, fine finger movements normal, no pronator drift. No focal weakness Sensory: normal and symmetric to light touch, pinprick, and  Vibration, in the upper and lower extremities Coordination: finger-nose-finger, heel-to-shin bilaterally, no dysmetria, no tremor Reflexes: 1+ upper lower and symmetric, plantar responses were flexor bilaterally. Gait and Station: Rising up from seated position with stand by assist ambulated 35 feet in hall  mildly unsteady gait  DIAGNOSTIC DATA (LABS, IMAGING, TESTING) - I reviewed patient records, labs, notes, testing and imaging myself where available.  Lab Results  Component Value Date   WBC 4.8 02/26/2017   HGB 13.6 02/26/2017   HCT 39.4 02/26/2017   MCV 94.5 02/26/2017   PLT 317 02/26/2017      Component Value Date/Time   NA 137 02/26/2017 0652   NA 134 09/20/2016 1531   K 3.5 02/26/2017 0652    CL 101 02/26/2017 0652   CO2 27 02/26/2017 0652   GLUCOSE 164 (H) 02/26/2017 0652   BUN 12 02/26/2017 0652   BUN 10 09/20/2016 1531   CREATININE 0.81 02/26/2017 0652   CREATININE 0.93 01/07/2015 1039   CALCIUM 9.3 02/26/2017 0652   PROT 6.9 02/20/2017 1012   PROT 7.3 09/20/2016 1531   ALBUMIN 3.6 02/20/2017 1012   ALBUMIN 4.5 09/20/2016 1531   AST 48 (H) 02/20/2017 1012   ALT 30 02/20/2017 1012   ALKPHOS 61 02/20/2017 1012   BILITOT 0.6 02/20/2017 1012   BILITOT 0.2 09/20/2016 1531   GFRNONAA >60 02/26/2017 0652   GFRNONAA >89 01/07/2015 1039   GFRAA >60 02/26/2017 0652   GFRAA >89 01/07/2015 1039   Lab Results  Component Value Date   CHOL 225 (H) 02/17/2017   HDL 31 (L) 02/17/2017   LDLCALC 159 (H) 02/17/2017   TRIG 174 (H) 02/17/2017   CHOLHDL 7.3 02/17/2017   Lab Results  Component Value Date   HGBA1C 14.4 (H) 02/17/2017    ASSESSMENT AND PLAN  56 y.o. year old male  has a past medical history of Blind; Diabetes mellitus; High cholesterol; Hypertension; Substance abuse; and Tobacco use disorder (10/07/2014). here for hospital follow-up forposterior left MCA territory infarct.The patient is a current patient of Dr. Erlinda Hong who is out of the office today . This note is sent to the work in doctor.     PLAN:Stressed the importance of management of risk factors to prevent further stroke Continue Plavix and Aspirin for 3 months then Plavix alone secondary stroke prevention(May 22, 2017) Maintain strict control of hypertension with blood pressure goal below 130/90, today's reading126/86 continue antihypertensive medications Control of diabetes with hemoglobin A1c below 6.5 followed by primary care most recent hemoglobin A1c14.4 continue diabetic medications Cholesterol with LDL cholesterol less than 70, followed by primary care,  most recent 159 continue statin drugs Lipitor Continue in-home therapies then home exercise program eat healthy diet with whole grains,  fresh fruits  and vegetables Will make referral for a 30 day event monitor to rule out atrial fibrillation Follow up 6 months Discussed risk for recurrent stroke/ TIA and answered additional questions This was a prolonged visit requiring 40 minutes and medical decision making of high complexity with extensive review of history, hospital chart, counseling and answering questions for both the patient and c/g Dennie Bible, [place],  [facility], APRN  [facility] [address], [place], [place] [address] [phone]

## 2017-04-09 ENCOUNTER — Ambulatory Visit (HOSPITAL_BASED_OUTPATIENT_CLINIC_OR_DEPARTMENT_OTHER): Payer: Medicaid Other | Admitting: Physical Medicine & Rehabilitation

## 2017-04-09 ENCOUNTER — Encounter: Payer: Medicaid Other | Attending: Physical Medicine & Rehabilitation

## 2017-04-09 ENCOUNTER — Encounter: Payer: Self-pay | Admitting: Physical Medicine & Rehabilitation

## 2017-04-09 VITALS — BP 128/85 | HR 73 | Resp 14

## 2017-04-09 DIAGNOSIS — Z5189 Encounter for other specified aftercare: Secondary | ICD-10-CM | POA: Diagnosis not present

## 2017-04-09 DIAGNOSIS — I69398 Other sequelae of cerebral infarction: Secondary | ICD-10-CM

## 2017-04-09 DIAGNOSIS — I69320 Aphasia following cerebral infarction: Secondary | ICD-10-CM

## 2017-04-09 DIAGNOSIS — R269 Unspecified abnormalities of gait and mobility: Secondary | ICD-10-CM | POA: Diagnosis not present

## 2017-04-09 DIAGNOSIS — E785 Hyperlipidemia, unspecified: Secondary | ICD-10-CM | POA: Insufficient documentation

## 2017-04-09 DIAGNOSIS — I69351 Hemiplegia and hemiparesis following cerebral infarction affecting right dominant side: Secondary | ICD-10-CM | POA: Insufficient documentation

## 2017-04-09 DIAGNOSIS — I10 Essential (primary) hypertension: Secondary | ICD-10-CM | POA: Diagnosis not present

## 2017-04-09 DIAGNOSIS — E119 Type 2 diabetes mellitus without complications: Secondary | ICD-10-CM | POA: Diagnosis not present

## 2017-04-09 NOTE — Patient Instructions (Signed)
May try hydrocortisone 1% cream for Left forearm skin area

## 2017-04-09 NOTE — Progress Notes (Signed)
Subjective:    Patient ID: Brady Church, male    DOB: May 23, 1961, 56 y.o.   MRN: [id number]  HPI Tingling in fingers and arm , speech doing better HHPT d/ced last week, SLP is finishing this week Pt feels like speech is close to baseline Seen by Dr Ladona Ridgel PCP 5/7 Seen by Neuro  5/11plan for DAPT until 6/26 then clopidigrel alone  Mod I ADL and mobility using blind cane Pain Inventory Average Pain 0 Pain Right Now 0 My pain is no pain  In the last 24 hours, has pain interfered with the following? General activity 0 Relation with others 0 Enjoyment of life 0 What TIME of day is your pain at its worst? no pain Sleep (in general) Fair  Pain is worse with: no pain Pain improves with: no pain Relief from Meds: no pain  Mobility walk with assistance use a cane  Function disabled: date disabled .  Neuro/Psych No problems in this area  Prior Studies Any changes since last visit?  no  Physicians involved in your care Any changes since last visit?  no   Family History  Problem Relation Age of Onset  . Coronary artery disease Mother 27       died of MI  . Cancer Father 54       Some GI cancer. Not sure if it was Colon Cancer or not.  . Hypertension Father   . Diabetes Maternal Grandmother    Social History   Social History  . Marital status: Single    Spouse name: [name]  . Number of children: [name]  . Years of education: 45   Social History Main Topics  . Smoking status: Former Smoker    Packs/day: 0.50    Types: Cigarettes    Quit date: 02/18/2017  . Smokeless tobacco: Never Used     Comment: About 10 cigs daily. Stopped 2 weeks ago   . Alcohol use No     Comment: Beer- 40 oz. every day.  . Drug use: No     Comment: Previous cocaine user. Stopped 2 years before.  Marland Kitchen Sexual activity: Not Asked   Other Topics Concern  . None   Social History Narrative   Lives in [place] for last 10 years with "friend"  [name]      Is not married and does not  have kids.   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   Past Surgical History:  Procedure Laterality Date  . CATARACT EXTRACTION  01/08/2012   Right eye  . EYE SURGERY     x 4 total    Past Medical History:  Diagnosis Date  . 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 Diabetes mellitus   . High cholesterol   . Hypertension   . Substance abuse    cocaine. Stopped in 2010  . Tobacco use disorder 10/07/2014   BP 128/85 (BP Location: Left Arm, Patient Position: Sitting, Cuff Size: Normal)   Pulse 73   Resp 14   SpO2 96%   Opioid Risk Score:   Fall Risk Score:  `1  Depression screen PHQ 2/9  Depression screen [facility] 2/9 04/02/2017 03/05/2017 10/11/2016 09/20/2016 07/10/2016 07/05/2016 06/05/2016  Decreased Interest 0 0 0 0 0 0 0  Down, Depressed, Hopeless 0 0 0 1 0 0 0  PHQ - 2 Score 0 0 0  1 0 0 0    Review of Systems  Constitutional: Negative.   HENT: Negative.   Eyes: Positive for visual disturbance.  Respiratory: Negative.   Cardiovascular: Negative.   Gastrointestinal: Negative.   Endocrine: Negative.   Genitourinary: Negative.   Musculoskeletal: Negative.   Skin: Positive for rash.  Allergic/Immunologic: Negative.   Neurological: Negative.   Hematological: Negative.   Psychiatric/Behavioral: Negative.   All other systems reviewed and are negative.      Objective:   Physical Exam  Constitutional: He is oriented to person, place, and time. He appears well-developed and well-nourished.  HENT:  Head: Normocephalic and atraumatic.  Eyes: Right eye exhibits no discharge. Left eye exhibits no discharge. No scleral icterus.  Able to read large font letters at distance of 6 inches  Neck: Normal range of motion.  Neurological: He is alert and oriented to person, place, and time.  5/5 strength, bilateral deltoid, bicep, tricep, grip, hip flexor, knee extensor, ankle dorsiflexor    Psychiatric: He has a normal mood and affect.  Nursing note and vitals reviewed.  Speech has occasional disfluency but otherwise good intelligibility . Sentence length speech        Assessment & Plan:  1.  L MCA infarct with Right HP aphasia, resolving DAPT until 6/26 Plateauing in therapy pt does not want to pursue OP SLP  We will recheck in 6 weeks. Make sure he is complaining with recommendations. If patient wishes to pursue outpatient speech therapy. We'll can arrange at that time.

## 2017-04-10 ENCOUNTER — Other Ambulatory Visit: Payer: Self-pay | Admitting: Physical Medicine and Rehabilitation

## 2017-04-10 ENCOUNTER — Other Ambulatory Visit: Payer: Self-pay

## 2017-04-10 MED ORDER — INSULIN GLARGINE 100 UNIT/ML SOLOSTAR PEN: 25.0000 [IU] | PEN_INJECTOR | Freq: Every day | SUBCUTANEOUS | 1 refills | Status: DC

## 2017-04-10 NOTE — Telephone Encounter (Signed)
Insulin Glargine (LANTUS) 100 UNIT/ML Solostar Pen, refill request 

## 2017-04-11 ENCOUNTER — Telehealth: Payer: Self-pay

## 2017-04-11 NOTE — Telephone Encounter (Signed)
Patient called very confused with which medications he should be taking went over patient MAR patient states these are not the medications he was given in the hospital. Metformin is still on medication list, but he is on insulin was told by someone he is not supposed be taking both, he states he  was given a medication called metapolism that is supposed to stop him from drinking, he was told by heart doctor that he is supposed to be taking some other vitamin. Advised patient that I will route his concerns to PCP and he will review his medication list will call him back with instructions.

## 2017-04-11 NOTE — Telephone Encounter (Signed)
I called Gobel about his medications, I had a little trouble understanding what he was asking, he put me on with his wife. I went over his current medication list and discussed the medications and dosing, I also addressed that it is ok to take metformin with insulin.

## 2017-04-11 NOTE — Telephone Encounter (Signed)
Patient called back still confused about why he is taking insulin and metformin , and is he supposed to be on a heart pill because he was on before, also asking why he can't get an appointment unil June with his PCP.Marland Kitchen Advised patient that metformin and insulin can be taken together he PCP is aware that he is on both patient states I don't want to be on both.  Appointment scheduled for tomorrow advised patient and wife to bring all medications and to write down specific questions he has regarding his medication.

## 2017-04-12 ENCOUNTER — Ambulatory Visit (INDEPENDENT_AMBULATORY_CARE_PROVIDER_SITE_OTHER): Payer: Medicaid Other | Admitting: Internal Medicine

## 2017-04-12 ENCOUNTER — Encounter: Payer: Self-pay | Admitting: Internal Medicine

## 2017-04-12 VITALS — BP 131/86 | HR 71 | Temp 98.3°F | Ht 68.0 in | Wt 226.0 lb

## 2017-04-12 DIAGNOSIS — Z8673 Personal history of transient ischemic attack (TIA), and cerebral infarction without residual deficits: Secondary | ICD-10-CM

## 2017-04-12 DIAGNOSIS — F17211 Nicotine dependence, cigarettes, in remission: Secondary | ICD-10-CM | POA: Diagnosis not present

## 2017-04-12 MED ORDER — CLOPIDOGREL BISULFATE 75 MG PO TABS: 75.0000 mg | ORAL_TABLET | Freq: Every day | ORAL | 11 refills | Status: DC

## 2017-04-12 MED ORDER — THIAMINE HCL 100 MG PO TABS: 100.0000 mg | ORAL_TABLET | Freq: Every day | ORAL | 11 refills | Status: DC

## 2017-04-12 MED ORDER — INSULIN GLARGINE 100 UNIT/ML SOLOSTAR PEN: 25.0000 [IU] | PEN_INJECTOR | Freq: Every day | SUBCUTANEOUS | 1 refills | Status: DC

## 2017-04-12 NOTE — Progress Notes (Signed)
Case discussed with Dr. Taylor at the time of the visit. We reviewed the resident's history and exam and pertinent patient test results. I agree with the assessment, diagnosis, and plan of care documented in the resident's note. 

## 2017-04-12 NOTE — Assessment & Plan Note (Signed)
The patient presents to clinic today with multiple questions regarding his medical therapy. Since his left MCA stroke  he has started several new medications and he was unclear on how to take these. I spent at least 30 minutes with the patient and his significant other going through each one of his medications. He seems to be tolerating them all well without side effects. I provided refills on his thiamine as he was out of this medication. He is to continue with dual antiplatelet therapy with aspirin and clopidogrel until 05/22/2017. This will be a total of 3 months of dual antiplatelet therapy. I discussed with him that he should continue to take both medications until this time in June. Given that I'm concerned the patient will not know when to stop his clopidogrel I advised him to call the clinic in June and we would provide him again with the exact date. I also encouraged him to reach out to his neurologist in June to be reminded of this stop date. He endorsed understanding of this plan. I answered all of his and his significant other's questions. Review of systems unremarkable. Physical examination unchanged from prior. Overall, I am very encouraged that he is participating in his care and wants to ensure he is on the proper medical therapy. He states he has continued to abstain from alcohol. I encouraged him in this. He will have follow-up with neurology and in clinic as needed. -- Continue current therapy -- Discontinue Plavix as documented above -- Follow-up with neurology as planned

## 2017-04-12 NOTE — Patient Instructions (Signed)
It was a pleasure seeing you today. Thank you for choosing [facility] for your healthcare needs.   You are doing a great job taking your medications.  We had a very lengthy discussion todayabout which medicines to continue. Please take them as we discussed in clinic.  Please ensure you follow up with your neurologist.   Please return to clinic as needed.

## 2017-04-12 NOTE — Progress Notes (Signed)
   CC: Disease and medical management HPI: Mr. Huu Doll is a 56 y.o. male with a past medical history as listed below who presents with multiple questions about his current medication regimen. He states he is confused on what he is to take and when to take it. He is also concerned on why he is taking it.  Past Medical History:  Diagnosis Date  . 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 Diabetes mellitus   . High cholesterol   . Hypertension   . Substance abuse    cocaine. Stopped in 2010  . Tobacco use disorder 10/07/2014     Review of Systems: Denies new symptoms. Does endorse some anxiety and agitation.  Physical Exam: There were no vitals filed for this visit. General appearance: alert and cooperative Head: Normocephalic, without obvious abnormality, atraumatic Lungs: clear to auscultation bilaterally Heart: regular rate and rhythm, S1, S2 normal, no murmur, click, rub or gallop Abdomen: soft, non-tender; bowel sounds normal; no masses,  no organomegaly Extremities: extremities normal, atraumatic, no cyanosis or edema  Assessment & Plan:  See encounters tab for problem based medical decision making. Patient discussed with Dr. Josem Kaufmann  Signed: Thomasene Lot, MD 04/12/2017, 9:32 AM  Pager: [phone]

## 2017-04-24 ENCOUNTER — Ambulatory Visit: Payer: Medicaid Other

## 2017-04-24 ENCOUNTER — Telehealth: Payer: Self-pay | Admitting: Internal Medicine

## 2017-04-24 NOTE — Telephone Encounter (Signed)
I attempted to call Mr. Lucarelli several times at both numbers without answer. I know Mr. Bostwick quite well but have not seen him since November 2017 when he no-showed an appointment in February 2018. After sustaining a CVA in March 2018, I have not seen him as he has been seen in [facility] several times, likely due to my schedule being full. I have never met nor spoken with Mr. Breau significant other. He previously attended all of his appointments alone.   In his appointment tomorrow the following should be addressed:  Recent MCA CVA: Compliance with DAPT. Plavix should be stopped on 05/22/17. He is also to be on atorvastatin 80 mg QD, ASA QD.  HTN: Compliance and control on Lisinopril 40 mg QD. Patient was previously on HCTZ and Carvedilol which were discontinued during hospitalization. These may need to be eventually restarted.  T2DM: Compliance and control on Metformin 1000 mg BID and Lantus 25 units QHS. Per previous notes and telephone calls, it seems that Mr. Osgood does not want to be on both. He may be having issues administering insulin given that he is blind. He was previously only on Metformin. It may help if he sees Butch Penny.  Martyn Malay, DO PGY-3 Internal Medicine Resident Pager # [phone] 04/24/2017 4:02 PM

## 2017-04-25 ENCOUNTER — Ambulatory Visit (INDEPENDENT_AMBULATORY_CARE_PROVIDER_SITE_OTHER): Payer: Medicaid Other | Admitting: Internal Medicine

## 2017-04-25 DIAGNOSIS — R202 Paresthesia of skin: Secondary | ICD-10-CM | POA: Diagnosis not present

## 2017-04-25 NOTE — Assessment & Plan Note (Signed)
The patient presents with a two-week history of paresthesia in his right upper and lower extremities. He describes this pain as a feeling of intermittent numbness, tingling and a "cold" feeling. It is not impairing his current function. His physical examination is unchanged from prior. He is intact to sensation in the upper and lower extremities bilaterally. The exact etiology of this complaint is unclear although the differential diagnosis includes diabetic neuropathy, some sort of post stroke syndrome or vitamin deficiency. He is currently on vitamin supplementation making vitamin deficiency less likely. Diabetic neuropathy less likely given it is unilateral. At this time I favor some sort of post residual stroke deficit. We discussed possibly starting a neuropathic medication such as gabapentin or pregabalin however the patient says it is not as bothersome for him and he does not wish to start medication at this time. I think this is completely appropriate. For now we will continue to monitor and treat if needed in the future. At this time I do not think further workup is necessary. -- Continue to monitor

## 2017-04-25 NOTE — Patient Instructions (Signed)
It was a pleasure seeing you today. Thank you for choosing [facility] for your healthcare needs.   Return to clinic in 1 month or as needed sooner.

## 2017-04-25 NOTE — Progress Notes (Signed)
   CC: Right-sided arm and leg paresthesia HPI: Mr. Brady Church is a 56 y.o. male with a past medical history as listed below who presents with right-sided arm and leg paresthesia  Past Medical History:  Diagnosis Date  . 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 Diabetes mellitus   . High cholesterol   . Hypertension   . Substance abuse    cocaine. Stopped in 2010  . Tobacco use disorder 10/07/2014     Review of Systems: Endorses right-sided arm and leg paresthesia no worsening weakness. No new symptoms. Physical Exam: Vitals:   04/25/17 1512  BP: 119/74  Pulse: 77  Temp: 97.6 F (36.4 C)  TempSrc: Oral  SpO2: 100%  Weight: 227 lb 3.2 oz (103.1 kg)  Height: 5\' 8"  (1.727 m)   BP 119/74 (BP Location: Left Arm, Patient Position: Sitting, Cuff Size: Normal)   Pulse 77   Temp 97.6 F (36.4 C) (Oral)   Ht 5\' 8"  (1.727 m)   Wt 227 lb 3.2 oz (103.1 kg)   SpO2 100%   BMI 34.55 kg/m  General appearance: alert and cooperative Head: Normocephalic, without obvious abnormality, atraumatic Lungs: clear to auscultation bilaterally Heart: regular rate and rhythm, S1, S2 normal, no murmur, click, rub or gallop Abdomen: soft, non-tender; bowel sounds normal; no masses,  no organomegaly Extremities: Multiple scattered areas of the cycles of CHOP and erythematous base on the left forearm consistent with contact dermatitis  Assessment & Plan:  See encounters tab for problem based medical decision making. Patient discussed with Dr. Cleda Daub  Signed: Thomasene Lot, MD 04/25/2017, 3:29 PM  Pager: [phone]

## 2017-04-27 ENCOUNTER — Ambulatory Visit (INDEPENDENT_AMBULATORY_CARE_PROVIDER_SITE_OTHER): Payer: Medicaid Other

## 2017-04-27 ENCOUNTER — Other Ambulatory Visit: Payer: Self-pay | Admitting: Nurse Practitioner

## 2017-04-27 DIAGNOSIS — I639 Cerebral infarction, unspecified: Secondary | ICD-10-CM | POA: Diagnosis not present

## 2017-04-27 DIAGNOSIS — I4891 Unspecified atrial fibrillation: Secondary | ICD-10-CM

## 2017-04-30 NOTE — Addendum Note (Signed)
Addended by: Carlynn Purl C on: 04/30/2017 08:55 AM   Modules accepted: Level of Service

## 2017-04-30 NOTE — Progress Notes (Signed)
Internal Medicine Clinic Attending  Case discussed with Dr. Taylor at the time of the visit.  We reviewed the resident's history and exam and pertinent patient test results.  I agree with the assessment, diagnosis, and plan of care documented in the resident's note. 

## 2017-05-01 ENCOUNTER — Ambulatory Visit (INDEPENDENT_AMBULATORY_CARE_PROVIDER_SITE_OTHER): Payer: Medicaid Other | Admitting: Dietician

## 2017-05-01 VITALS — Wt 225.4 lb

## 2017-05-01 DIAGNOSIS — Z713 Dietary counseling and surveillance: Secondary | ICD-10-CM | POA: Diagnosis not present

## 2017-05-01 DIAGNOSIS — E119 Type 2 diabetes mellitus without complications: Secondary | ICD-10-CM | POA: Diagnosis not present

## 2017-05-01 DIAGNOSIS — E1136 Type 2 diabetes mellitus with diabetic cataract: Secondary | ICD-10-CM

## 2017-05-01 DIAGNOSIS — Z6834 Body mass index (BMI) 34.0-34.9, adult: Secondary | ICD-10-CM

## 2017-05-01 NOTE — Progress Notes (Addendum)
Diabetes Self-Management Education  Visit Type:  Follow-up  Appt. Start Time: 920 Appt. End Time: 1015  05/01/2017  Mr. Brady Church, identified by name and date of birth, is a 56 y.o. male with a diagnosis of Diabetes:  .   ASSESSMENT  Weight 225 lb 6.4 oz (102.2 kg). Body mass index is 34.27 kg/m.   Brady Church and assistance to become more self confident in self testing. He said he felt like he could do it by himself at the end of our visit.         Diabetes Self-Management Education - 05/01/17 1500      Health Coping   How would you rate your overall health? Good     Psychosocial Assessment   Patient Belief/Attitude about Diabetes Motivated to manage diabetes   Self-care barriers Impaired vision;Lack of transportation   Self-management support Doctor's office;Friends;CDE visits   Patient Concerns Monitoring   Special Needs Large print   Preferred Learning Style Hands on   Learning Readiness Ready     Pre-Education Assessment   Patient understands monitoring blood glucose, interpreting and using results Needs Church     Complications   How often do you check your blood sugar? 1-2 times/day   Fasting Blood glucose range (mg/dL) 52-778   Postprandial Blood glucose range (mg/dL) 24-235;361-443   Number of hypoglycemic episodes per month 0   Number of hyperglycemic episodes per week 0   Are you checking your feet? Yes   How many days per week are you checking your feet? 7     Subsequent Visit   Since your last visit have you continued or begun to take your medications as prescribed? Yes   Since your last visit have you had your blood pressure checked? Yes   Is your most recent blood pressure lower, unchanged, or higher since your last visit? Unchanged   Since your last visit have you experienced any weight changes? Loss   Weight Loss (lbs) 2   Since your last visit, are you checking your blood glucose at least once a day? Yes       Learning Objective:  Patient will have a greater understanding of diabetes self-management. Patient education plan is to attend individual and/or group sessions per assessed needs and concerns. My plan to support myself in continuing these changes to care for my diabetes is to attend or contact:   Urban ministry,doctor's office, CDE, Dietitian, pharmacist, .  Plan:   Patient Instructions  This months goal: getting the blood on the strip to check your own blood sugar more often  Next appointment is July 9th at 1 PM    Expected Outcomes:    good, consider AA for support prn if not already attending Education material provided: Support group flyer  If problems or questions, patient to contact team via:  Phone  Future DSME appointment: -  4 weeks per pt request Ashtian Villacis, [name], RD 05/01/2017 3:44 PM.

## 2017-05-01 NOTE — Patient Instructions (Addendum)
This months goal: getting the blood on the strip to check your own blood sugar more often  Next appointment is July 9th at 1 PM

## 2017-05-02 ENCOUNTER — Encounter: Payer: Self-pay | Admitting: *Deleted

## 2017-05-21 ENCOUNTER — Ambulatory Visit (HOSPITAL_BASED_OUTPATIENT_CLINIC_OR_DEPARTMENT_OTHER): Payer: Medicaid Other | Admitting: Physical Medicine & Rehabilitation

## 2017-05-21 ENCOUNTER — Encounter: Payer: Self-pay | Admitting: Physical Medicine & Rehabilitation

## 2017-05-21 ENCOUNTER — Encounter: Payer: Medicaid Other | Attending: Physical Medicine & Rehabilitation

## 2017-05-21 VITALS — BP 97/65 | HR 85 | Resp 14

## 2017-05-21 DIAGNOSIS — I69351 Hemiplegia and hemiparesis following cerebral infarction affecting right dominant side: Secondary | ICD-10-CM | POA: Diagnosis not present

## 2017-05-21 DIAGNOSIS — E119 Type 2 diabetes mellitus without complications: Secondary | ICD-10-CM | POA: Insufficient documentation

## 2017-05-21 DIAGNOSIS — E785 Hyperlipidemia, unspecified: Secondary | ICD-10-CM | POA: Diagnosis not present

## 2017-05-21 DIAGNOSIS — Z5189 Encounter for other specified aftercare: Secondary | ICD-10-CM | POA: Diagnosis not present

## 2017-05-21 DIAGNOSIS — I10 Essential (primary) hypertension: Secondary | ICD-10-CM | POA: Diagnosis not present

## 2017-05-21 DIAGNOSIS — I69320 Aphasia following cerebral infarction: Secondary | ICD-10-CM | POA: Diagnosis present

## 2017-05-21 DIAGNOSIS — Z8673 Personal history of transient ischemic attack (TIA), and cerebral infarction without residual deficits: Secondary | ICD-10-CM | POA: Diagnosis not present

## 2017-05-21 NOTE — Patient Instructions (Signed)
Call me for outpatient speech therapy if you want to pursue this
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
## 2017-05-29 ENCOUNTER — Encounter (HOSPITAL_COMMUNITY): Payer: Self-pay

## 2017-05-29 ENCOUNTER — Emergency Department (HOSPITAL_COMMUNITY)
Admission: EM | Admit: 2017-05-29 | Discharge: 2017-05-30 | Disposition: A | Discharge status: Home / Self Care | Payer: Medicaid Other | Attending: Emergency Medicine | Admitting: Emergency Medicine

## 2017-05-29 ENCOUNTER — Emergency Department (HOSPITAL_COMMUNITY): Payer: Medicaid Other

## 2017-05-29 DIAGNOSIS — I110 Hypertensive heart disease with heart failure: Secondary | ICD-10-CM | POA: Insufficient documentation

## 2017-05-29 DIAGNOSIS — T07XXXA Unspecified multiple injuries, initial encounter: Secondary | ICD-10-CM

## 2017-05-29 DIAGNOSIS — Z79899 Other long term (current) drug therapy: Secondary | ICD-10-CM | POA: Insufficient documentation

## 2017-05-29 DIAGNOSIS — Y998 Other external cause status: Secondary | ICD-10-CM | POA: Insufficient documentation

## 2017-05-29 DIAGNOSIS — I5042 Chronic combined systolic (congestive) and diastolic (congestive) heart failure: Secondary | ICD-10-CM | POA: Insufficient documentation

## 2017-05-29 DIAGNOSIS — S39012A Strain of muscle, fascia and tendon of lower back, initial encounter: Secondary | ICD-10-CM | POA: Diagnosis not present

## 2017-05-29 DIAGNOSIS — Z7982 Long term (current) use of aspirin: Secondary | ICD-10-CM | POA: Diagnosis not present

## 2017-05-29 DIAGNOSIS — Y9389 Activity, other specified: Secondary | ICD-10-CM | POA: Diagnosis not present

## 2017-05-29 DIAGNOSIS — Z7902 Long term (current) use of antithrombotics/antiplatelets: Secondary | ICD-10-CM | POA: Insufficient documentation

## 2017-05-29 DIAGNOSIS — Z87891 Personal history of nicotine dependence: Secondary | ICD-10-CM | POA: Diagnosis not present

## 2017-05-29 DIAGNOSIS — S3992XA Unspecified injury of lower back, initial encounter: Secondary | ICD-10-CM | POA: Diagnosis present

## 2017-05-29 DIAGNOSIS — Z794 Long term (current) use of insulin: Secondary | ICD-10-CM | POA: Insufficient documentation

## 2017-05-29 DIAGNOSIS — E119 Type 2 diabetes mellitus without complications: Secondary | ICD-10-CM | POA: Insufficient documentation

## 2017-05-29 DIAGNOSIS — F1410 Cocaine abuse, uncomplicated: Secondary | ICD-10-CM | POA: Insufficient documentation

## 2017-05-29 DIAGNOSIS — Y92410 Unspecified street and highway as the place of occurrence of the external cause: Secondary | ICD-10-CM | POA: Insufficient documentation

## 2017-05-29 DIAGNOSIS — H548 Legal blindness, as defined in USA: Secondary | ICD-10-CM | POA: Insufficient documentation

## 2017-05-29 MED ORDER — METHOCARBAMOL 500 MG PO TABS: 500.0000 mg | ORAL_TABLET | Freq: Three times a day (TID) | ORAL | 0 refills | Status: DC

## 2017-05-29 MED ORDER — METHOCARBAMOL 500 MG PO TABS
500.0000 mg | ORAL_TABLET | Freq: Once | ORAL | Status: AC
  Administered 2017-05-30: 500 mg via ORAL
  Filled 2017-05-29: qty 1

## 2017-05-29 MED ORDER — ACETAMINOPHEN 500 MG PO TABS
1000.0000 mg | ORAL_TABLET | Freq: Once | ORAL | Status: AC
  Administered 2017-05-30: 1000 mg via ORAL
  Filled 2017-05-29: qty 2
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
## 2017-05-29 NOTE — ED Notes (Signed)
Name called for room assignment - no answer 
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
## 2017-05-29 NOTE — ED Notes (Signed)
Patient transported to X-ray 
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
## 2017-06-01 ENCOUNTER — Encounter: Payer: Self-pay | Admitting: Dietician

## 2017-06-01 ENCOUNTER — Telehealth: Payer: Self-pay | Admitting: Dietician

## 2017-06-01 NOTE — Telephone Encounter (Addendum)
Says [facility] is telling him his test strips will cost him over $80.00.  Called [facility], The prescription went through at his usual [facility] and will be ready for pick up today. Patient called and informed.

## 2017-06-04 ENCOUNTER — Ambulatory Visit (INDEPENDENT_AMBULATORY_CARE_PROVIDER_SITE_OTHER): Payer: Medicaid Other | Admitting: Internal Medicine

## 2017-06-04 ENCOUNTER — Ambulatory Visit (INDEPENDENT_AMBULATORY_CARE_PROVIDER_SITE_OTHER): Payer: Medicaid Other | Admitting: Dietician

## 2017-06-04 VITALS — BP 128/73 | HR 71 | Temp 97.6°F | Ht 68.0 in | Wt 221.0 lb

## 2017-06-04 DIAGNOSIS — Z794 Long term (current) use of insulin: Secondary | ICD-10-CM | POA: Diagnosis not present

## 2017-06-04 DIAGNOSIS — F1011 Alcohol abuse, in remission: Secondary | ICD-10-CM

## 2017-06-04 DIAGNOSIS — Z7982 Long term (current) use of aspirin: Secondary | ICD-10-CM | POA: Diagnosis not present

## 2017-06-04 DIAGNOSIS — E119 Type 2 diabetes mellitus without complications: Secondary | ICD-10-CM | POA: Diagnosis not present

## 2017-06-04 DIAGNOSIS — Z6833 Body mass index (BMI) 33.0-33.9, adult: Secondary | ICD-10-CM | POA: Diagnosis not present

## 2017-06-04 DIAGNOSIS — Z8673 Personal history of transient ischemic attack (TIA), and cerebral infarction without residual deficits: Secondary | ICD-10-CM

## 2017-06-04 DIAGNOSIS — I69351 Hemiplegia and hemiparesis following cerebral infarction affecting right dominant side: Secondary | ICD-10-CM | POA: Diagnosis not present

## 2017-06-04 DIAGNOSIS — E1136 Type 2 diabetes mellitus with diabetic cataract: Secondary | ICD-10-CM

## 2017-06-04 DIAGNOSIS — Z23 Encounter for immunization: Secondary | ICD-10-CM

## 2017-06-04 DIAGNOSIS — F1411 Cocaine abuse, in remission: Secondary | ICD-10-CM | POA: Diagnosis not present

## 2017-06-04 DIAGNOSIS — Z713 Dietary counseling and surveillance: Secondary | ICD-10-CM

## 2017-06-04 DIAGNOSIS — H548 Legal blindness, as defined in USA: Secondary | ICD-10-CM | POA: Diagnosis not present

## 2017-06-04 DIAGNOSIS — I69322 Dysarthria following cerebral infarction: Secondary | ICD-10-CM | POA: Diagnosis not present

## 2017-06-04 DIAGNOSIS — F17211 Nicotine dependence, cigarettes, in remission: Secondary | ICD-10-CM | POA: Diagnosis not present

## 2017-06-04 DIAGNOSIS — Z0000 Encounter for general adult medical examination without abnormal findings: Secondary | ICD-10-CM

## 2017-06-04 DIAGNOSIS — I10 Essential (primary) hypertension: Secondary | ICD-10-CM | POA: Diagnosis present

## 2017-06-04 DIAGNOSIS — F1911 Other psychoactive substance abuse, in remission: Secondary | ICD-10-CM

## 2017-06-04 LAB — GLUCOSE, CAPILLARY: Glucose-Capillary: 98 mg/dL (ref 65–99)

## 2017-06-04 LAB — POCT GLYCOSYLATED HEMOGLOBIN (HGB A1C): Hemoglobin A1C: 6

## 2017-06-04 NOTE — Assessment & Plan Note (Addendum)
Patient reports compliance with his metformin 1000 mg twice a day and Lantus 25 mg daily (takes in AM). Patient is legally blind but has a caregiver who helps administer his Lantus. He denies any episodes or symptoms of hypoglycemia. A1c today has significantly improved, 14.4 -> 6.0. Review of his glucometer today shows that 100% of his readings have been in target range. He reports that he has been actively working to limit carbs and has also been meeting with our clinic dietitian regularly. He has lost 4lbs since his last appointment. I applauded patient and encouraged him to keep it up. If he continues to loose weight and A1c continues to downtrend, I informed him that we may be able to taper off Lantus in the near future.  -- Continue metformin 1000 mg BID -- Continue Lantus 25 mg daily  -- Follow up 3 months with glucometer  -- Consider tapering off lantus if A1c continues to downtrend

## 2017-06-04 NOTE — Patient Instructions (Signed)
Mr. Harlin Heys,   It was a pleasure to see you. Please stop taking your Plavix (aka clopidogrel). Please continue to take your aspirin and other medications as previously prescribed. Please keep up the good work with your diabetes! If your numbers continue to improve, we may be able to stop the insulin in the future. I will call you with the results of your blood work today. Please follow up in 3 months with your glucometer. If you have any questions or concerns, call our clinic at [phone] or after hours call [phone] and ask for the internal medicine resident on call. Thank you!  - Dr. Antony Contras

## 2017-06-04 NOTE — Assessment & Plan Note (Signed)
Patient has a history of left MCA stroke in March of 2018 now with residual dysarthria and right upper extremity paresthesia. He has been on dual antiplatelet therapy with aspirin and Plavix with a planned stop date that was set on 05/22/17. Unfortunately patient could not remember when he was suppose to stop Plavix and has continued to take both. I instructed patient to discontinue Plavix today, and continue aspirin alone. He reports compliance with atorvastatin. He does not have his medications with him today. He is legally blind but has a caretaker who helps administer his medications. I provided written instructions for him to give to his caretaker.  -- Stop plavix  -- Continue ASA 325 daily  -- Continue atorvastatin 80 mg daily

## 2017-06-04 NOTE — Progress Notes (Addendum)
Diabetes Self-Management Education  Visit Type:  Follow-up  Appt. Start Time: 1320 Appt. End Time: 1350  06/04/2017  Mr. [name], identified by name and date of birth, is a 56 y.o. male with a diagnosis of Diabetes:  Marland Kitchen  Type 2 diabetes ASSESSMENT  Jermey is doing well. Speech is better, blood sugars are excellent. His weight is decreasing gradually. He is appropriately concerned about his feet and foot care.   weight-221# BMI-33.6, encouraged continued weight loss to ~ 210#  Meter readings: 118,90,110, 99, 104, average 7 days-109, average 14 days 104, average 30 days 101. average 90 days 115       Diabetes Self-Management Education - 06/04/17 1400      Health Coping   How would you rate your overall health? Good     Psychosocial Assessment   Patient Belief/Attitude about Diabetes Motivated to manage diabetes   Self-care barriers Impaired vision;Lack of transportation;Lack of material resources   Self-management support Doctor's office;Friends;CDE visits   Patient Concerns Monitoring;Glycemic Control   Special Needs Large print   Preferred Learning Style Hands on   Learning Readiness Ready     Complications   Last HgB A1C per patient/outside source 6 %  decreased from 14.4%   How often do you check your blood sugar? 1-2 times/day   Fasting Blood glucose range (mg/dL) 17-510   Number of hypoglycemic episodes per month 0   Number of hyperglycemic episodes per week 0   Have you had a dilated eye exam in the past 12 months? Yes  Dr. Ernesto Rutherford   Are you checking your feet? Yes  Reports has a podiatry appointment on July 11   How many days per week are you checking your feet? 7     Dietary Intake   Breakfast same     Exercise   Exercise Type ADL's;Light (walking / raking leaves)   How many days per week to you exercise? 7   How many minutes per day do you exercise? 30  has 34 stairs to his apartment   Total minutes per week of exercise 210     Patient  Self-Evaluation of Goals - Patient rates self as meeting previously set goals (% of time)   Monitoring >75%  reports no problems with using meter     Outcomes   Program Status Completed     Subsequent Visit   Since your last visit have you continued or begun to take your medications as prescribed? Yes   Since your last visit have you had your blood pressure checked? No   Is your most recent blood pressure lower, unchanged, or higher since your last visit? Lower   Since your last visit have you experienced any weight changes? Loss   Weight Loss (lbs) 5   Since your last visit, are you checking your blood glucose at least once a day? Yes      Learning Objective:  Patient will have a greater understanding of diabetes self-management. Patient education plan is to attend individual and/or group sessions per assessed needs and concerns. My plan to support myself in continuing these changes to care for my diabetes is to attend or contact:   Diabetes Support Groups  Type 2 diabetes support group : 2nd Monday of every month from 6-7 PM at [address] [facility] conference room [phone]  local support resources -doctor's office, CDE, Dietitian, pharmacist, church  Plan:   There are no Patient Instructions on file for this visit.  Expected Outcomes:  Demonstrated interest in learning. Expect positive outcomes  Education material provided: Support group flyer  If problems or questions, patient to contact team via:  Phone  Future DSME appointment: - 3-4 months  Tyrah Broers, [name], RD 06/04/2017 2:28 PM.

## 2017-06-04 NOTE — Assessment & Plan Note (Addendum)
Received pneumovax 23 today. Follow up screening Hep C antibody.   ADDENDUM: Hep C ab negative.

## 2017-06-04 NOTE — Patient Instructions (Addendum)
Standley Brooking Job taking care of yourself!  Your weight is now 221#, suggest goal of 210# from eating healthier choices like fruits and vegetables, nuts, beans and whole grains and getting plenty of activity.   Your A1C is 6% that means your average blood sugar is about 120. That ia excellent control and much better than it was.   Please make a follow up appointment with me for diabetes education follow up in October 2018. (3 months)

## 2017-06-04 NOTE — Progress Notes (Signed)
   CC: DM Follow up  HPI:  Mr.Brady Church is a 56 y.o. male with past medical history outlined below here for diabetes and HTN follow up. For the details of today's visit, please refer to the assessment and plan.  Past Medical History:  Diagnosis Date  . 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 Diabetes mellitus   . High cholesterol   . Hypertension   . Substance abuse    cocaine. Stopped in 2010  . Tobacco use disorder 10/07/2014    Review of Systems:  Denies dizziness, lightheadedness, and episodes of hypoglycemia.   Physical Exam:  Vitals:   06/04/17 1349  BP: 128/73  Pulse: 71  Temp: 97.6 F (36.4 C)  TempSrc: Oral  SpO2: 100%  Weight: 221 lb (100.2 kg)  Height: 5\' 8"  (1.727 m)    Constitutional: NAD, appears comfortable HEENT: Right pupil is asymmetric but reactive to light, left eye with cataract, non reactive. Right eye with light perception, can count fingers Cardiovascular: RRR, no murmurs, rubs, or gallops.  Pulmonary/Chest: CTAB, no wheezes, rales, or rhonchi.  Abdominal: Soft, non tender, non distended. +BS.  Extremities: Warm and well perfused. No edema.  Neurological: A&Ox3, CN II - XII grossly intact. +Dysarthria  Skin: No rashes or erythema  Psychiatric: Normal mood and affect  Assessment & Plan:   See Encounters Tab for problem based charting.  Patient discussed with Dr. Oswaldo Done

## 2017-06-04 NOTE — Assessment & Plan Note (Addendum)
Patient reports compliance with his lisinopril 40 mg daily. HCTZ was discontinued during his recent hospitalization in March 2018. However patient reports he is still taking it, 12.5 mg daily. Blood pressure today is well controlled 128/73. Will continue current regimen for now. Follow-up BMP. -- Continue lisinopril 40 mg daily  -- Continue HCTZ 12.5 mg daily   -- F/u BMP   ADDENDUM: BMP within normal range. Continue above regimen.

## 2017-06-04 NOTE — Assessment & Plan Note (Signed)
History of polysubstance abuse (cocaine, alcohol) but reports he quit using after his stroke in March of 2018. I provided support and encouraged patient to continue with abstinence.

## 2017-06-05 ENCOUNTER — Telehealth: Payer: Self-pay | Admitting: *Deleted

## 2017-06-05 LAB — BMP8+ANION GAP
Anion Gap: 17 mmol/L (ref 10.0–18.0)
BUN / CREAT RATIO: 15 (ref 9–20)
BUN: 16 mg/dL (ref 6–24)
CO2: 24 mmol/L (ref 20–29)
CREATININE: 1.07 mg/dL (ref 0.76–1.27)
Calcium: 9.9 mg/dL (ref 8.7–10.2)
Chloride: 98 mmol/L (ref 96–106)
GFR calc Af Amer: 90 mL/min/{1.73_m2} (ref >59)
GFR calc non Af Amer: 78 mL/min/{1.73_m2} (ref >59)
Glucose: 78 mg/dL (ref 65–99)
Potassium: 3.8 mmol/L (ref 3.5–5.2)
Sodium: 139 mmol/L (ref 134–144)

## 2017-06-05 LAB — HEPATITIS C ANTIBODY

## 2017-06-05 NOTE — Telephone Encounter (Signed)
LVM informing patient that the Cardiac event monitor showed normal sinus rhythm or normal heart rhythm. Left number for any questions.

## 2017-06-05 NOTE — Progress Notes (Signed)
Internal Medicine Clinic Attending  Case discussed with Dr. Guilloud at the time of the visit.  We reviewed the resident's history and exam and pertinent patient test results.  I agree with the assessment, diagnosis, and plan of care documented in the resident's note.  

## 2017-06-06 ENCOUNTER — Ambulatory Visit (INDEPENDENT_AMBULATORY_CARE_PROVIDER_SITE_OTHER): Payer: Medicaid Other | Admitting: Podiatry

## 2017-06-06 ENCOUNTER — Encounter: Payer: Self-pay | Admitting: Podiatry

## 2017-06-06 DIAGNOSIS — B351 Tinea unguium: Secondary | ICD-10-CM | POA: Diagnosis not present

## 2017-06-06 DIAGNOSIS — M79676 Pain in unspecified toe(s): Secondary | ICD-10-CM

## 2017-06-06 DIAGNOSIS — E119 Type 2 diabetes mellitus without complications: Secondary | ICD-10-CM

## 2017-06-06 NOTE — Patient Instructions (Addendum)
Wear athletic walking or running style shoes with a crack with  Try shoe store DSW at the shop [employer]  Diabetes and Foot Care Diabetes may cause you to have problems because of poor blood supply (circulation) to your feet and legs. This may cause the skin on your feet to become thinner, break easier, and heal more slowly. Your skin may become dry, and the skin may peel and crack. You may also have nerve damage in your legs and feet causing decreased feeling in them. You may not notice minor injuries to your feet that could lead to infections or more serious problems. Taking care of your feet is one of the most important things you can do for yourself. Follow these instructions at home:  Wear shoes at all times, even in the house. Do not go barefoot. Bare feet are easily injured.  Check your feet daily for blisters, cuts, and redness. If you cannot see the bottom of your feet, use a mirror or ask someone for help.  Wash your feet with warm water (do not use hot water) and mild soap. Then pat your feet and the areas between your toes until they are completely dry. Do not soak your feet as this can dry your skin.  Apply a moisturizing lotion or petroleum jelly (that does not contain alcohol and is unscented) to the skin on your feet and to dry, brittle toenails. Do not apply lotion between your toes.  Trim your toenails straight across. Do not dig under them or around the cuticle. File the edges of your nails with an emery board or nail file.  Do not cut corns or calluses or try to remove them with medicine.  Wear clean socks or stockings every day. Make sure they are not too tight. Do not wear knee-high stockings since they may decrease blood flow to your legs.  Wear shoes that fit properly and have enough cushioning. To break in new shoes, wear them for just a few hours a day. This prevents you from injuring your feet. Always look in your shoes before you put them on to be sure  there are no objects inside.  Do not cross your legs. This may decrease the blood flow to your feet.  If you find a minor scrape, cut, or break in the skin on your feet, keep it and the skin around it clean and dry. These areas may be cleansed with mild soap and water. Do not cleanse the area with peroxide, alcohol, or iodine.  When you remove an adhesive bandage, be sure not to damage the skin around it.  If you have a wound, look at it several times a day to make sure it is healing.  Do not use heating pads or hot water bottles. They may burn your skin. If you have lost feeling in your feet or legs, you may not know it is happening until it is too late.  Make sure your health care provider performs a complete foot exam at least annually or more often if you have foot problems. Report any cuts, sores, or bruises to your health care provider immediately. Contact a health care provider if:  You have an injury that is not healing.  You have cuts or breaks in the skin.  You have an ingrown nail.  You notice redness on your legs or feet.  You feel burning or tingling in your legs or feet.  You have pain or cramps in your legs and  feet.  Your legs or feet are numb.  Your feet always feel cold. Get help right away if:  There is increasing redness, swelling, or pain in or around a wound.  There is a red line that goes up your leg.  Pus is coming from a wound.  You develop a fever or as directed by your health care provider.  You notice a bad smell coming from an ulcer or wound. This information is not intended to replace advice given to you by your health care provider. Make sure you discuss any questions you have with your health care provider. Document Released: 11/10/2000 Document Revised: 04/20/2016 Document Reviewed: 04/22/2013 Elsevier Interactive Patient Education  2017 [employer].

## 2017-06-07 NOTE — Progress Notes (Signed)
Patient ID: Brady Church, male   DOB: [birth year], 56 y.o.   MRN: [id number]    Subjective: Today this patient presents for a scheduled visit complaining of painful, elongated and thickened toenails and requests toenail debridement History of CVA 2018  Objective:  Vascular: No peripheral edema noted bilaterally DP and PT pulses 2/4 bilaterally Capillary reflex immediate bilaterally  Neurological: Sensation to 10 g monofilament wire intact 5/5 bilaterally Vibratory sensation reactive bilaterally Ankle reflex equal and reactive bilaterally  Dermatological: Texture and turgor within normal limits bilaterally No skin lesions noted bilaterally The toenails are incurvated, elongated, discolored with texture and color changes in palpable tenderness 6-10  Musculoskeletal: Patient walks slowly with assistance of cane for visual impairment Hammertoe deformity second bilaterally There is no restriction ankle, subtalar, midtarsal joints bilaterally HAV left  Assessment: Symptomatic onychomycoses 6-10 Diabetic without complications  Plan: Debridement toenails 6-10 mechanical and electrically without any bleeding  Reappoint 3 months

## 2017-06-11 ENCOUNTER — Telehealth: Payer: Self-pay

## 2017-06-11 NOTE — Telephone Encounter (Signed)
Requesting lab results. Please call back.  

## 2017-06-12 ENCOUNTER — Telehealth: Payer: Self-pay | Admitting: Internal Medicine

## 2017-06-12 NOTE — Telephone Encounter (Signed)
Called patient and updated him with normal BMP results.

## 2017-06-12 NOTE — Telephone Encounter (Signed)
WANTS RESULTS OF LAB WORK

## 2017-06-18 ENCOUNTER — Ambulatory Visit (INDEPENDENT_AMBULATORY_CARE_PROVIDER_SITE_OTHER): Payer: Self-pay | Admitting: Internal Medicine

## 2017-06-18 ENCOUNTER — Encounter: Payer: Self-pay | Admitting: Internal Medicine

## 2017-06-18 DIAGNOSIS — R52 Pain, unspecified: Secondary | ICD-10-CM

## 2017-06-18 DIAGNOSIS — M79601 Pain in right arm: Secondary | ICD-10-CM

## 2017-06-18 DIAGNOSIS — M79604 Pain in right leg: Secondary | ICD-10-CM

## 2017-06-18 DIAGNOSIS — M545 Low back pain: Secondary | ICD-10-CM

## 2017-06-18 DIAGNOSIS — Z79899 Other long term (current) drug therapy: Secondary | ICD-10-CM

## 2017-06-18 DIAGNOSIS — Z87891 Personal history of nicotine dependence: Secondary | ICD-10-CM

## 2017-06-18 DIAGNOSIS — M542 Cervicalgia: Secondary | ICD-10-CM

## 2017-06-18 LAB — GLUCOSE, CAPILLARY: Glucose-Capillary: 79 mg/dL (ref 65–99)

## 2017-06-18 MED ORDER — CYCLOBENZAPRINE HCL 5 MG PO TABS: 5.0000 mg | ORAL_TABLET | Freq: Three times a day (TID) | ORAL | 0 refills | Status: DC | PRN

## 2017-06-18 MED ORDER — IBUPROFEN 600 MG PO TABS: 600.0000 mg | ORAL_TABLET | Freq: Three times a day (TID) | ORAL | 0 refills | Status: DC | PRN
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
## 2017-06-18 NOTE — Patient Instructions (Signed)
Mr. Nankervis,  It was a pleasure meeting you today. Please take ibuprofen 600 mg every 8 hours as needed for your pain. Please take Flexeril as well every 8 hours as needed. Please follow up with your primary care provider in 3 weeks.

## 2017-06-19 DIAGNOSIS — R52 Pain, unspecified: Secondary | ICD-10-CM | POA: Insufficient documentation
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
## 2017-06-20 NOTE — Progress Notes (Signed)
Internal Medicine Clinic Attending  Case discussed with Dr. Hoffman at the time of the visit.  We reviewed the resident's history and exam and pertinent patient test results.  I agree with the assessment, diagnosis, and plan of care documented in the resident's note.  

## 2017-06-21 DIAGNOSIS — H2589 Other age-related cataract: Secondary | ICD-10-CM | POA: Diagnosis not present

## 2017-06-21 DIAGNOSIS — H401123 Primary open-angle glaucoma, left eye, severe stage: Secondary | ICD-10-CM | POA: Diagnosis not present

## 2017-06-21 DIAGNOSIS — H2512 Age-related nuclear cataract, left eye: Secondary | ICD-10-CM | POA: Diagnosis not present

## 2017-06-26 ENCOUNTER — Telehealth: Payer: Self-pay | Admitting: *Deleted

## 2017-06-26 DIAGNOSIS — I10 Essential (primary) hypertension: Secondary | ICD-10-CM

## 2017-06-26 NOTE — Telephone Encounter (Signed)
Pt's sig other/ caregiver called and wanted to go over pt's medlist, he picked up meds from pharmacy today and had HCTZ in what he picked up, this was discontinued when disch from hospital, could we remove the HCTZ from his medlist. Also it is possible that he has until today been taking plavix still but caregiver states she will discard the bottle so he wont take anymore. Also I called the pharm discont the plavix and HCTZ there so he will not get them next month

## 2017-06-27 MED ORDER — HYDROCHLOROTHIAZIDE 12.5 MG PO CAPS: 12.5000 mg | ORAL_CAPSULE | Freq: Every day | ORAL | 2 refills | Status: DC

## 2017-06-27 NOTE — Telephone Encounter (Signed)
New prescription for HCTZ 12.5 mg daily sent to [facility] on [address].

## 2017-06-27 NOTE — Telephone Encounter (Signed)
Please send in a new script for the HCTZ

## 2017-06-27 NOTE — Addendum Note (Signed)
Addended by: Burnell Blanks on: 06/27/2017 04:40 PM   Modules accepted: Orders

## 2017-06-27 NOTE — Telephone Encounter (Signed)
Hi Helen,  Thank you for your help, I actually continued his HCTZ at this last visit. He should be on 12.5 mg daily. She should have stopped the plavix. Do I need to call the caregiver?

## 2017-07-04 ENCOUNTER — Ambulatory Visit: Payer: Medicaid Other | Admitting: Podiatry

## 2017-07-09 ENCOUNTER — Encounter: Payer: Medicaid Other | Admitting: Internal Medicine

## 2017-07-09 NOTE — Progress Notes (Deleted)
   CC: ***  HPI:  Mr.Brady Church is a 56 y.o. male with past medical history outlined below here for ***. For the details of today's visit, please refer to the assessment and plan.  Past Medical History:  Diagnosis Date  . 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 Diabetes mellitus   . High cholesterol   . Hypertension   . Substance abuse    cocaine. Stopped in 2010  . Tobacco use disorder 10/07/2014    Review of Systems:  ***   Family History: ***   Social History: ***   Physical Exam:  There were no vitals filed for this visit.  Constitutional: NAD, appears comfortable HEENT: Atraumatic, normocephalic. PERRL, anicteric sclera.  Neck: Supple, trachea midline.  Cardiovascular: RRR, no murmurs, rubs, or gallops.  Pulmonary/Chest: CTAB, no wheezes, rales, or rhonchi. No chest wall abnormalities.  Abdominal: Soft, non tender, non distended. +BS.  GU: ***  Extremities: Warm and well perfused. Distal pulses intact. No edema.  Neurological: A&Ox3, CN II - XII grossly intact.  Skin: No rashes or erythema  Psychiatric: Normal mood and affect  Assessment & Plan:   See Encounters Tab for problem based charting.  Patient {GC/GE:[id number]::"discussed with","seen with"} Dr. {NAMES:[id number]::"Butcher","Granfortuna","E. Hoffman","Klima","Mullen","Narendra","Vincent"}

## 2017-07-19 ENCOUNTER — Telehealth: Payer: Self-pay | Admitting: *Deleted

## 2017-07-19 NOTE — Telephone Encounter (Signed)
WALK-IN Pt walked in to go over his meds and when to take them, made him a list in large letters/ numbers of meds and times, he was very appreciative

## 2017-07-20 ENCOUNTER — Other Ambulatory Visit: Payer: Self-pay | Admitting: *Deleted

## 2017-07-20 MED ORDER — ASPIRIN 325 MG PO TBEC: 325.0000 mg | DELAYED_RELEASE_TABLET | Freq: Every day | ORAL | 0 refills | Status: DC

## 2017-07-20 MED ORDER — METFORMIN HCL 1000 MG PO TABS: 1000.0000 mg | ORAL_TABLET | Freq: Two times a day (BID) | ORAL | 2 refills | Status: DC

## 2017-07-20 MED ORDER — LISINOPRIL 40 MG PO TABS: 40.0000 mg | ORAL_TABLET | Freq: Every day | ORAL | 2 refills | Status: DC

## 2017-07-20 NOTE — Telephone Encounter (Signed)
Patient has called for med refill x2, Informed that lisinopril,metformin, ASA has been approved. HCTZ ,  Lipitor, vit B has plenty of refills. Verbalized understanding.

## 2017-08-06 ENCOUNTER — Encounter: Payer: Self-pay | Admitting: Dietician

## 2017-08-06 ENCOUNTER — Ambulatory Visit (INDEPENDENT_AMBULATORY_CARE_PROVIDER_SITE_OTHER): Payer: Medicaid Other | Admitting: Dietician

## 2017-08-06 ENCOUNTER — Encounter: Payer: Self-pay | Admitting: Internal Medicine

## 2017-08-06 ENCOUNTER — Ambulatory Visit (INDEPENDENT_AMBULATORY_CARE_PROVIDER_SITE_OTHER): Payer: Medicaid Other | Admitting: Internal Medicine

## 2017-08-06 DIAGNOSIS — G629 Polyneuropathy, unspecified: Secondary | ICD-10-CM | POA: Insufficient documentation

## 2017-08-06 DIAGNOSIS — G6289 Other specified polyneuropathies: Secondary | ICD-10-CM

## 2017-08-06 DIAGNOSIS — E1136 Type 2 diabetes mellitus with diabetic cataract: Secondary | ICD-10-CM

## 2017-08-06 DIAGNOSIS — E1142 Type 2 diabetes mellitus with diabetic polyneuropathy: Secondary | ICD-10-CM | POA: Diagnosis not present

## 2017-08-06 DIAGNOSIS — E119 Type 2 diabetes mellitus without complications: Secondary | ICD-10-CM

## 2017-08-06 LAB — GLUCOSE, CAPILLARY: Glucose-Capillary: 155 mg/dL — ABNORMAL HIGH (ref 65–99)

## 2017-08-06 NOTE — Patient Instructions (Addendum)
Dr. Memory Dance phone number is [phone]  See you on Thursday September 20 at 10:15 for group diabetes meeting

## 2017-08-06 NOTE — Patient Instructions (Signed)
Mr. Scarpulla,  It was a pleasure to see you today. Please continue to take all of your medications as previously prescribed. I will call you with the results of your blood work. Please follow up with me in 1 month for your diabetes. Please bring your glucometer with you to your appointment. If you have any questions or concerns, call our clinic at [phone] or after hours call [phone] and ask for the internal medicine resident on call. Thank you!  - Dr. Antony Contras

## 2017-08-06 NOTE — Progress Notes (Signed)
   CC: Numbness and tingling   HPI:  Mr.Brady Church is a 56 y.o. male with past medical history outlined below here with complaint of numbness and tingling. For the details of today's visit, please refer to the assessment and plan.  Past Medical History:  Diagnosis Date  . 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 Diabetes mellitus   . High cholesterol   . Hypertension   . Substance abuse    cocaine. Stopped in 2010  . Tobacco use disorder 10/07/2014    Review of Systems:  Endorses numbness and tingling. Denies weakness or gain instability.   Physical Exam:  Vitals:   08/06/17 1540  BP: (!) 112/54  Pulse: 80  Temp: 98.4 F (36.9 C)  TempSrc: Oral  SpO2: 100%  Weight: 215 lb 11.2 oz (97.8 kg)  Height: 5\' 8"  (1.727 m)    Constitutional: NAD, appears comfortable Cardiovascular: RRR Pulmonary/Chest: CTAB Extremities: Warm and well perfused. No edema.  Neurological: Bilateral upper and lower extremity strength intact. Sensation to light touch intact.  Psychiatric: Normal mood and affect  Assessment & Plan:   See Encounters Tab for problem based charting.  Patient discussed with Dr. Heide Spark

## 2017-08-06 NOTE — Progress Notes (Signed)
Diabetes Self-Management Education  Visit Type:  Follow-up  Appt. Start Time: 1430 Appt. End Time: 1530  08/06/2017  Brady Church, identified by name and date of birth, is a 56 y.o. male with a diagnosis of Diabetes:  .Type 2   ASSESSMENT  Blood pressure (!) 112/54, pulse 80, temperature 98.4 F (36.9 C), temperature source Oral, height 5\' 8"  (1.727 m), weight 215 lb 11.2 oz (97.8 kg), SpO2 100 %. Body mass index is 32.8 kg/m.  Lab Results  Component Value Date   HGBA1C 6.0 06/04/2017                  Diabetes Self-Management Education - 08/06/17 1400      Health Coping   How would you rate your overall health? Excellent     Psychosocial Assessment   Patient Belief/Attitude about Diabetes Motivated to manage diabetes   Self-care barriers Lack of transportation;Lack of material resources   Self-management support Doctor's office;Friends;CDE visits   Patient Concerns Healthy Lifestyle;Glycemic Control   Special Needs Large print;Verbal instruction   Preferred Learning Style Auditory;Hands on   Learning Readiness Ready     Complications   Have you had a dilated eye exam in the past 12 months? Yes   Have you had a dental exam in the past 12 months? No  has an appointment   Are you checking your feet? Yes     Dietary Intake   Breakfast 3 cups coffee, flavored oatmeal or cheerios, milk,    Lunch cracker or pear, bacon egg and cheese sandwich  water   Dinner small piece of steak, baked beans, greens, water, bread x 2    Snack (evening)  pear or an orange   Beverage(s) coffee, water,      Exercise   Exercise Type ADL's;Light (walking / raking leaves)   How many days per week to you exercise? 7   How many minutes per day do you exercise? 50   Total minutes per week of exercise 350     Patient Education   Previous Diabetes Education Yes (please comment)     Subsequent Visit   Since your last visit have you continued or begun to take your medications as  prescribed? Yes   Since your last visit have you had your blood pressure checked? Yes   Is your most recent blood pressure lower, unchanged, or higher since your last visit? Lower   Since your last visit have you experienced any weight changes? Loss   Weight Loss (lbs) 6   Since your last visit, are you checking your blood glucose at least once a day? Yes      Learning Objective:  Patient will have a greater understanding of diabetes self-management. Patient education plan is to attend individual and/or group sessions per assessed needs and concerns. My plan to support myself in continuing these changes to care for my diabetes is to attend or contact:   Type 2 diabetes support group : 3rd thursday of every month from 10:15 a 11:15 AM Stress Relief ? Add local Yoga classes Other  local support resources -doctor's office, CDE, Dietitian, pharmacist, church  Plan:   Patient Instructions  Dr. Memory Dance phone number is [phone]  See you on Thursday September 20 at 10:15 for group diabetes meeting    Expected Outcomes:   good with continued support  Education material provided: Support group flyer  If problems or questions, patient to contact team via:  Phone  Future DSME  appointment: -    Yearly Plyler, Brady Church, RD 08/06/2017 3:49 PM.

## 2017-08-06 NOTE — Assessment & Plan Note (Addendum)
Patient is here today with complaints of peripheral neuropathy. He describes symptoms of numbness and tingling in his bilateral toes and fingers. I discussed with him the likely diagnosis of diabetic neuropathy as his description and location of symptoms are consistent (stocking-glove distribution). I counseled patient that there is no "fix" for this issue other than tight glycemic control which we have achieved on his current regimen. I encouraged him to continue the good work with his weight loss and compliance with medications. I offered a prescription for gabapentin however patient has declined. He would like to avoid additional medications if possible. To be thorough, we will check TSH, RPR, vitamin B12, and HIV to rule out other causes of neuropathy. Instructed patient to return in 1 month for diabetes follow-up and A1c recheck. -- F/u TSH, RPR, vitamin B12, and HIV -- F/u 1 month for DM and A1c recheck   ADDENDUM: TSH, RPR, vit B12, and HIV all negative/normal. Neuropathy is likely diabetic in nature as I suspected. Called patient with results. Instructed patient to let me know if symptoms progress to the point where he would like to start a medication. He expressed understanding.

## 2017-08-07 LAB — VITAMIN B12

## 2017-08-07 LAB — HIV ANTIBODY (ROUTINE TESTING W REFLEX): HIV SCREEN 4TH GENERATION: NONREACTIVE

## 2017-08-07 LAB — TSH: TSH: 1.81 u[IU]/mL (ref 0.450–4.500)

## 2017-08-07 LAB — RPR: RPR Ser Ql: NONREACTIVE

## 2017-08-08 NOTE — Progress Notes (Signed)
Internal Medicine Clinic Attending  Case discussed with Dr. Guilloud at the time of the visit.  We reviewed the resident's history and exam and pertinent patient test results.  I agree with the assessment, diagnosis, and plan of care documented in the resident's note.  

## 2017-08-16 ENCOUNTER — Ambulatory Visit: Payer: Medicaid Other | Admitting: Dietician

## 2017-08-17 ENCOUNTER — Telehealth: Payer: Self-pay | Admitting: Dietician

## 2017-08-17 NOTE — Telephone Encounter (Signed)
ReScheduled for group diabetes in october

## 2017-08-20 ENCOUNTER — Encounter: Payer: Self-pay | Admitting: Podiatry

## 2017-08-20 ENCOUNTER — Ambulatory Visit (INDEPENDENT_AMBULATORY_CARE_PROVIDER_SITE_OTHER): Payer: Medicaid Other | Admitting: Podiatry

## 2017-08-20 DIAGNOSIS — M79676 Pain in unspecified toe(s): Secondary | ICD-10-CM | POA: Diagnosis not present

## 2017-08-20 DIAGNOSIS — E119 Type 2 diabetes mellitus without complications: Secondary | ICD-10-CM

## 2017-08-20 DIAGNOSIS — B351 Tinea unguium: Secondary | ICD-10-CM | POA: Diagnosis not present

## 2017-08-20 NOTE — Progress Notes (Signed)
Patient ID: Brady Church, male   DOB: 05/03/1961, 55 y.o.   MRN: [id number]    Subjective: Today this patient presents for a scheduled visit complaining of painful, elongated and thickened toenails and requests toenail debridement History of CVA 2018  Objective:  Vascular: No peripheral edema noted bilaterally DP and PT pulses 2/4 bilaterally Capillary reflex immediate bilaterally  Neurological: Sensation to 10 g monofilament wire intact 5/5 bilaterally Vibratory sensation reactive bilaterally Ankle reflex equal and reactive bilaterally  Dermatological: Texture and turgor within normal limits bilaterally No skin lesions noted bilaterally The toenails are incurvated, elongated, discolored with texture and color changes in palpable tenderness 6-10  Musculoskeletal: Patient walks slowly with assistance of cane for visual impairment Hammertoe deformity second bilaterally There is no restriction ankle, subtalar, midtarsal joints bilaterally HAV left  Assessment: Symptomatic onychomycoses 6-10 Diabetic without complications  Plan: Debridement toenails 6-10 mechanical and electrically without any bleeding  Reappoint 3 months 

## 2017-08-20 NOTE — Patient Instructions (Signed)

## 2017-08-23 ENCOUNTER — Telehealth: Payer: Self-pay | Admitting: Internal Medicine

## 2017-08-23 NOTE — Telephone Encounter (Signed)
Called pt, went over his medlist

## 2017-08-23 NOTE — Telephone Encounter (Signed)
Pls call patient, pt got medicine mixed

## 2017-08-30 ENCOUNTER — Other Ambulatory Visit: Payer: Self-pay | Admitting: *Deleted

## 2017-08-30 DIAGNOSIS — E1136 Type 2 diabetes mellitus with diabetic cataract: Secondary | ICD-10-CM

## 2017-08-30 MED ORDER — GLUCOSE BLOOD VI STRP: ORAL_STRIP | 1 refills | Status: DC

## 2017-09-03 ENCOUNTER — Encounter: Payer: Self-pay | Admitting: Internal Medicine

## 2017-09-03 ENCOUNTER — Encounter (INDEPENDENT_AMBULATORY_CARE_PROVIDER_SITE_OTHER): Payer: Self-pay

## 2017-09-03 ENCOUNTER — Ambulatory Visit (INDEPENDENT_AMBULATORY_CARE_PROVIDER_SITE_OTHER): Payer: Medicaid Other | Admitting: Internal Medicine

## 2017-09-03 VITALS — BP 110/62 | HR 85 | Temp 98.1°F | Wt 214.0 lb

## 2017-09-03 DIAGNOSIS — Z794 Long term (current) use of insulin: Secondary | ICD-10-CM

## 2017-09-03 DIAGNOSIS — E1136 Type 2 diabetes mellitus with diabetic cataract: Secondary | ICD-10-CM

## 2017-09-03 DIAGNOSIS — E7800 Pure hypercholesterolemia, unspecified: Secondary | ICD-10-CM | POA: Diagnosis not present

## 2017-09-03 DIAGNOSIS — G6289 Other specified polyneuropathies: Secondary | ICD-10-CM

## 2017-09-03 LAB — GLUCOSE, CAPILLARY: GLUCOSE-CAPILLARY: 111 mg/dL — AB (ref 65–99)

## 2017-09-03 LAB — POCT GLYCOSYLATED HEMOGLOBIN (HGB A1C): Hemoglobin A1C: 5.7

## 2017-09-03 MED ORDER — GABAPENTIN 300 MG PO CAPS: 300.0000 mg | ORAL_CAPSULE | Freq: Three times a day (TID) | ORAL | 2 refills | Status: DC

## 2017-09-03 MED ORDER — INSULIN GLARGINE 100 UNIT/ML SOLOSTAR PEN: 20.0000 [IU] | PEN_INJECTOR | Freq: Every day | SUBCUTANEOUS | 0 refills | Status: DC

## 2017-09-03 NOTE — Patient Instructions (Signed)
Mr. Reedus,   It was a pleasure to see you today. You are doing an excellent job controlling your diabetes with your medication and diet. Keep up the good work! Because you are doing such a good job, today we are going to decrease your Lantus insulin to 20 units daily. Please continue to check your blood sugar daily. Your morning fasting blood sugar goal is 80-120. Please follow up with me in 2 weeks to see how you are doing. Please bring your glucometer with you to your appointment. For your nerve pain, I have sent a prescription for gabapentin to your pharmacy. Please start by taking just 1 tablet a day. After a few days, you may increase this to twice a day. After a few more days you may go up to three times a day. This titration will help reduce the side effect of sedation. If you have any questions or concerns, call our clinic at [phone] or after hours call [phone] and ask for the internal medicine resident on call. Thank you!  - Dr. Jules Husbands

## 2017-09-03 NOTE — Progress Notes (Signed)
   CC: DM follow up  HPI:  Mr.Brady Church is a 56 y.o. male with past medical history outlined below here for DM follow up. For the details of today's visit, please refer to the assessment and plan.  Past Medical History:  Diagnosis Date  . 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 Diabetes mellitus   . High cholesterol   . Hypertension   . Substance abuse (HCC)    cocaine. Stopped in 2010  . Tobacco use disorder 10/07/2014    Review of Systems  Constitutional: Negative for diaphoresis.  Neurological: Negative for dizziness and weakness.    Physical Exam:  Vitals:   09/03/17 1328  BP: 110/62  Pulse: 85  Temp: 98.1 F (36.7 C)  TempSrc: Oral  SpO2: 100%  Weight: 214 lb (97.1 kg)    Constitutional: NAD, appears comfortable Cardiovascular: RRR, no murmurs, rubs, or gallops.  Pulmonary/Chest: CTAB, no wheezes, rales, or rhonchi.  Extremities: Warm and well perfused. No edema.  Psychiatric: Normal mood and affect  Assessment & Plan:   See Encounters Tab for problem based charting.  Patient discussed with Dr. Cleda Daub

## 2017-09-04 NOTE — Assessment & Plan Note (Signed)
Patient is complaining of numbness and tingling in his bilateral toes and fingers ([place] glove distribution) most likely consistent with diabetic neuropathy. Patient was seen previously for this complaint and workup with TSH, RPR, vitamin B12, and HIV were negative/normal. At that time patient declined medication for her symptoms. Today his symptoms have become more bothersome and he is now requesting to start gabapentin. -- Gabapentin 300 mg TID, uptitrate to efficacy

## 2017-09-04 NOTE — Progress Notes (Signed)
Internal Medicine Clinic Attending  Case discussed with Dr. Guilloud at the time of the visit.  We reviewed the resident's history and exam and pertinent patient test results.  I agree with the assessment, diagnosis, and plan of care documented in the resident's note.  

## 2017-09-04 NOTE — Assessment & Plan Note (Signed)
Patient is here for diabetes follow-up. He reports compliance with his metformin 1000 mg twice a day and Lantus 25 mg daily (takes in AM). Patient is legally blind but has a caregiver who helps administer his Lantus. He denies episodes of hypoglycemia. At the time of diagnosis, A1c was 14.4. Since that time he has lost 30+ pounds and A1c has decreased with medications now to 5.7 today. Patient does not have his glucometer with him today. I congratulated patient on his weight loss and encouraged him to keep up the good work. Today we discussed decreasing his Lantus to 20 units a day. Plan to have patient returned with glucometer in 2 weeks. -- Decrease Lantus to 20 units daily -- Continue metformin 1000 mg BID -- Follow-up 2 weeks with glucometer

## 2017-09-10 ENCOUNTER — Encounter: Payer: Medicaid Other | Admitting: Internal Medicine

## 2017-09-11 ENCOUNTER — Ambulatory Visit: Payer: Medicaid Other | Admitting: Podiatry

## 2017-09-13 ENCOUNTER — Ambulatory Visit: Payer: Medicaid Other | Admitting: Dietician

## 2017-09-13 DIAGNOSIS — E1136 Type 2 diabetes mellitus with diabetic cataract: Secondary | ICD-10-CM

## 2017-09-13 NOTE — Progress Notes (Signed)
Diabetes Self Management Education and Support: Start time: 10:30 am  end time: 11:15 am  Patient attended diabetes education group visit today for 60 minutes. The program included successes and challenges over the past few weeks, goal setting and evaluation, how to read a meter download report, what is fiber and why is it important and a cooking demo of chocolate chia pudding.     Blood sugars: meter download shows average of 123 for 25 tests in past month, range is 97-160.  Health maintenance: up to date except ophthalmology and patient has gone, we need to obtain report, report  Weight- not done today  Plan: patient plans to attend group diabetes meeting in 1 month. Plyler, [name], RD 09/13/2017 1:53 PM.

## 2017-09-13 NOTE — Patient Instructions (Addendum)
Vasiliy,  It was a pleasure having you in group today!!  We look forward to having your join [place] again on October 11, 2017.   Happy Halloween!  [name] [phone]

## 2017-09-17 ENCOUNTER — Encounter: Payer: Self-pay | Admitting: Internal Medicine

## 2017-09-17 ENCOUNTER — Ambulatory Visit (INDEPENDENT_AMBULATORY_CARE_PROVIDER_SITE_OTHER): Payer: Medicaid Other | Admitting: Internal Medicine

## 2017-09-17 VITALS — BP 122/65 | HR 79 | Temp 97.9°F | Wt 212.5 lb

## 2017-09-17 DIAGNOSIS — E1142 Type 2 diabetes mellitus with diabetic polyneuropathy: Secondary | ICD-10-CM

## 2017-09-17 DIAGNOSIS — E1136 Type 2 diabetes mellitus with diabetic cataract: Secondary | ICD-10-CM | POA: Diagnosis not present

## 2017-09-17 DIAGNOSIS — Z79899 Other long term (current) drug therapy: Secondary | ICD-10-CM | POA: Diagnosis not present

## 2017-09-17 DIAGNOSIS — I10 Essential (primary) hypertension: Secondary | ICD-10-CM | POA: Diagnosis not present

## 2017-09-17 DIAGNOSIS — Z794 Long term (current) use of insulin: Secondary | ICD-10-CM | POA: Diagnosis not present

## 2017-09-17 DIAGNOSIS — F17211 Nicotine dependence, cigarettes, in remission: Secondary | ICD-10-CM | POA: Diagnosis not present

## 2017-09-17 DIAGNOSIS — H548 Legal blindness, as defined in USA: Secondary | ICD-10-CM | POA: Diagnosis not present

## 2017-09-17 DIAGNOSIS — G6289 Other specified polyneuropathies: Secondary | ICD-10-CM

## 2017-09-17 MED ORDER — GABAPENTIN 600 MG PO TABS: 600.0000 mg | ORAL_TABLET | Freq: Three times a day (TID) | ORAL | 2 refills | Status: DC

## 2017-09-17 NOTE — Addendum Note (Signed)
Addended by: Maura Crandall on: 09/17/2017 04:02 PM   Modules accepted: Orders

## 2017-09-17 NOTE — Assessment & Plan Note (Signed)
Well-controlled today, 122/65. Continue current regimen. -- Lisinopril 40 mg daily -- Continue HCTZ 12.5 mg daily

## 2017-09-17 NOTE — Progress Notes (Signed)
   CC: DM follow up  HPI:  Mr.Brady Church is a 56 y.o. male with past medical history outlined below here for DM follow up. For the details of today's visit, please refer to the assessment and plan.  Past Medical History:  Diagnosis Date  . 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 Diabetes mellitus   . High cholesterol   . Hypertension   . Substance abuse (HCC)    cocaine. Stopped in 2010  . Tobacco use disorder 10/07/2014    Review of Systems  Neurological: Negative for dizziness and weakness.     Physical Exam:  Vitals:   09/17/17 1426  BP: 122/65  Pulse: 79  Temp: 97.9 F (36.6 C)  TempSrc: Oral  SpO2: 99%  Weight: 212 lb 8 oz (96.4 kg)    Constitutional: NAD, appears comfortable Cardiovascular: RRR, no murmurs, rubs, or gallops.  Pulmonary/Chest: CTAB, no wheezes, rales, or rhonchi.  Extremities: Warm and well perfused. No edema.  Psychiatric: Normal mood and affect  Assessment & Plan:   See Encounters Tab for problem based charting.  Patient discussed with Dr. Criselda Peaches

## 2017-09-17 NOTE — Patient Instructions (Signed)
Mr. Flippen,  It was a pleasure to see you today. I have increased the dose of your gabapentin to 600 mg three times daily. You may take two pills of your current dose three times a day until you run out and then start the higher dose prescription. Please decrease you insulin to 10 units daily. Follow up with me in 2 weeks with your meter. If you have any questions or concerns, call our clinic at [phone] or after hours call [phone] and ask for the internal medicine resident on call. Thank you!  - Dr. Antony Contras

## 2017-09-17 NOTE — Assessment & Plan Note (Signed)
Patient reports his numbness and tingling have improved on gabapentin, but are still present. Requesting an increase in his gabapentin. -- Increase gabapentin to 600 mg TID

## 2017-09-17 NOTE — Assessment & Plan Note (Signed)
Patient is here for diabetes follow-up. He is well-controlled on Lantus and metformin. Last A1c 2 weeks ago was 5.7. We decreased his Lantus from 25 to 20 units daily. He reports he has tolerated this dose reduction well. He denies hypo-and hyperglycemic episodes. In fact, patient reports that he ran out of his insulin last week for a few days. He continued to check fasting CBG during this time and reports he remained in the 120-130 range. Unfortunately he did not remember to bring his meter in today. We will decreased to 10 units daily today. Patient is legally blind requiring a caregiver to administer his Lantus. I am hopeful that we will be able to stop insulin in the near future. If A1c up trends, will consider starting other oral antidiabetic agents rather than restarting insulin. Follow-up 2 weeks with meter.  -- Decrease lantus to 10 units daily -- Continue metformin 1,000 mg BID -- F/u 2 weeks with meter

## 2017-09-18 NOTE — Progress Notes (Signed)
Internal Medicine Clinic Attending  Case discussed with Dr. Guilloud at the time of the visit.  We reviewed the resident's history and exam and pertinent patient test results.  I agree with the assessment, diagnosis, and plan of care documented in the resident's note.  

## 2017-09-19 ENCOUNTER — Telehealth: Payer: Self-pay | Admitting: *Deleted

## 2017-09-19 DIAGNOSIS — G6289 Other specified polyneuropathies: Secondary | ICD-10-CM

## 2017-09-19 NOTE — Telephone Encounter (Signed)
Call to [facility] for PA for Gabapentin 600 mg tablets.  Information was given.  To be reviewed by Pharmacy.  Determination within 24 hours.  PA  [phone].  Angelina Ok, RN 09/19/2017 10:23 AM.
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
## 2017-09-21 MED ORDER — GABAPENTIN 300 MG PO CAPS: 600.0000 mg | ORAL_CAPSULE | Freq: Three times a day (TID) | ORAL | 2 refills | Status: DC

## 2017-09-21 NOTE — Progress Notes (Signed)
Interchanged from tablet to capsule per Medicaid formulary

## 2017-09-26 ENCOUNTER — Telehealth: Payer: Self-pay

## 2017-09-26 NOTE — Telephone Encounter (Signed)
Rtc, no answer, lm for rtc 

## 2017-09-26 NOTE — Telephone Encounter (Signed)
Patient sugar running high, pls call

## 2017-09-26 NOTE — Telephone Encounter (Signed)
Needs to speak with a nurse about high blood sugar. Please call back.

## 2017-09-26 NOTE — Telephone Encounter (Signed)
Pt calls and states his sugars are a little higher than normal since cutting insulin back, this am fasting was 155, he states no symptoms and feels good in general. He has f/u appt 11/5 w/ dr Antony Contras, he is reminded to bring meter and meds to appt and states he will do so. He is ask to call for any symptoms or extreme blood sugars, he is agreeable

## 2017-09-27 ENCOUNTER — Other Ambulatory Visit: Payer: Self-pay | Admitting: Internal Medicine

## 2017-09-27 NOTE — Progress Notes (Signed)
[facility]  PATIENT: Brady Church DOB: 01/25/1961   REASON FOR VISIT:  follow-up for posterior left MCA territory infarct HISTORY FROM:patient and c/g Brady Church     HISTORY OF PRESENT ILLNESS:UPDATE 11/5/2018CM Brady Church, 56 year old male returns for follow-up with history of stroke event in March 2018.  He has not had further stroke or TIA symptoms.  He is currently on aspirin 325 daily with minimal bruising and no bleeding.  He was told by his primary care to to continue the aspirin and stop the Plavix in June.  Blood pressure in the office today 107/62.  He has been seeing a nutritionist and is eating healthy.  Hemoglobin A1c down to 5.7 and he continues to have adjustments in his insulin dosage.  He is walking for exercise at least an hour every day.  Cardiac event monitor which did not show any atrial fibrillation but normal sinus rhythm.  He remains on Lipitor without myalgias, he denies any further cocaine abuse.  He returns for reevaluation   HISTORY 04/06/17 CM Brady Church a 56 y.o.malewith a history of T2DM, limited vision, CHF, polysubstance abuse;who presented on 02/16/17 with slurred speech, word finding deficits, right sided sensory changes, and staggering gait. MRI revealed an acute ischemic non-hemorrhagic posterior left MCA infarct. 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  UDS positive for cocaine. Cardiology consulted for input on elevated troponin and felt that elevation with non-diagnostic pattern and patient with chronic systolic HF--euvolemic. No further work up needed. Dr. Erlinda Church recommends ASA and plavix for 3 months followed by Plavix alone. Therapy evaluations revealed moderate to severe dysarthria with difficulty following multi-step commands, sensory deficits RUE affecting ability to carry out ADL tasks and substantial functional deficits. PM&R was recommended for rehab  needs he returns to the stroke clinic today for hospital follow-up . He is currently on aspirin and Plavix secondary stroke prevention with no bruising and bleeding. After 3 months he can just take Plavix. He has not had further stroke or TIA symptoms since discharge. He is currently on Lipitor for hyperlipidemia without myalgias. LDL 159. His caregiver took his blood sugar this morning it was 90 he continues on insulin. Hemoglobin A1c in the hospital 14.4 He is getting home health speech therapy and physical therapy. He denies any further cocaine abuse. He returns for reevaluation. 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 ACCU-CHEK FASTCLIX LANCETS MISC Check blood sugar 3 times a day 102 each 12  . aspirin 325 MG EC tablet Take 1 tablet (325 mg total) by mouth daily. 100 tablet 0  . atorvastatin (LIPITOR) 80 MG tablet Take 1 tablet (80 mg total) by mouth daily at 6 PM. 30 tablet 11  . blood glucose meter kit and supplies KIT Dispense based on patient and insurance preference. Use up to four times daily as directed. (FOR ICD-9 250.00, 250.01). 1 each 0  . Blood Glucose Monitoring Suppl (ACCU-CHEK AVIVA PLUS) w/Device KIT Check blood  sugar 3 times a day 1 kit 0  . brimonidine (ALPHAGAN) 0.2 % ophthalmic solution Place 1 drop into both eyes 2 (two) times daily. 5 mL 12  . cyclobenzaprine (FLEXERIL) 5 MG tablet Take 1 tablet (5 mg total) by mouth 3 (three) times daily as  needed for muscle spasms. 21 tablet 0  . dorzolamide-timolol (COSOPT) 22.3-6.8 MG/ML ophthalmic solution Place 1 drop into the right eye 2 (two) times daily. 10 mL 3  . folic acid (FOLVITE) 1 MG tablet Take 1 tablet (1 mg total) by mouth daily. 30 tablet 11  . gabapentin (NEURONTIN) 300 MG capsule Take 2 capsules (600 mg total) by mouth 3 (three) times daily. (Patient taking differently: Take 900 mg daily by mouth. ) 180 capsule 2  . glucose blood (ACCU-CHEK AVIVA PLUS) test strip Check blood sugar 3 times a day as instructed. diag code E11.36. Insulin dependent. 300 each 1  . hydrochlorothiazide (MICROZIDE) 12.5 MG capsule Take 1 capsule (12.5 mg total) by mouth daily. 30 capsule 2  . ibuprofen (ADVIL,MOTRIN) 600 MG tablet Take 1 tablet (600 mg total) by mouth every 8 (eight) hours as needed. 60 tablet 0  . Insulin Glargine (LANTUS) 100 UNIT/ML Solostar Pen Inject 20 Units into the skin daily with breakfast. 3 mL 0  . Insulin Pen Needle 32G X 6 MM MISC 1 application by Does not apply route daily with breakfast. 100 each 0  . lisinopril (PRINIVIL,ZESTRIL) 40 MG tablet Take 1 tablet (40 mg total) by mouth daily. 30 tablet 2  . metFORMIN (GLUCOPHAGE) 1000 MG tablet Take 1 tablet (1,000 mg total) by mouth 2 (two) times daily with a meal. 60 tablet 2  . Multiple Vitamin (MULTIVITAMIN WITH MINERALS) TABS tablet Take 1 tablet by mouth daily. 100 tablet 0  . senna-docusate (SENOKOT-S) 8.6-50 MG tablet Take 1 tablet by mouth 2 (two) times daily. 30 tablet 0  . thiamine 100 MG tablet Take 1 tablet (100 mg total) by mouth daily. 30 tablet 11  . gabapentin (NEURONTIN) 600 MG tablet TAKE 1 TABLET BY MOUTH THREE TIMES DAILY (Patient not taking: Reported on 10/01/2017) 90 tablet 5   No facility-administered medications prior to visit.     PAST MEDICAL HISTORY: Past Medical History:  Diagnosis Date  . 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 Diabetes mellitus   . High cholesterol   .  Hypertension   . Substance abuse ([place])    cocaine. Stopped in 2010  . Tobacco use disorder 10/07/2014    PAST SURGICAL HISTORY: Past Surgical History:  Procedure Laterality Date  . CATARACT EXTRACTION  01/08/2012   Right eye  . EYE SURGERY     x 4 total     FAMILY HISTORY: Family History  Problem Relation Age of Onset  . Coronary artery disease Mother 2       died of MI  . Cancer Father 31       Some GI cancer. Not sure if it was Colon Cancer or not.  . Hypertension Father   . Diabetes Maternal Grandmother     SOCIAL HISTORY: Social History   Socioeconomic History  . Marital status: Single    Spouse name: Not on file  . Number of children: Not on file  . Years of education: 70  . Highest education level: Not on file  Social Needs  . Financial resource strain: Not on file  . Food insecurity - worry: Not on  file  . Food insecurity - inability: Not on file  . Transportation needs - medical: Not on file  . Transportation needs - non-medical: Not on file  Occupational History  . Not on file  Tobacco Use  . Smoking status: Former Smoker    Packs/day: 0.50    Types: Cigarettes    Last attempt to quit: 02/18/2017    Years since quitting: 0.6  . Smokeless tobacco: Never Used  . Tobacco comment: About 10 cigs daily. Stopped 2 weeks ago   Substance and Sexual Activity  . Alcohol use: No    Alcohol/week: 0.0 oz    Comment: Beer- 40 oz. every day.  . Drug use: No    Comment: Previous cocaine user. Stopped 2 years before.  Marland Kitchen Sexual activity: Not on file  Other Topics Concern  . Not on file  Social History Narrative   Lives in [place] for last 10 years with "friend"  [name]      Is not married and does not have kids.   Working sporadically as a Horticulturist, commercial.    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     PHYSICAL EXAM  Vitals:   10/01/17 0840  BP: 107/62  Pulse: 68  Weight: 217 lb (98.4 kg)  Height: _0  (1.727 m)   Body  mass index is 32.99 kg/m.  Generalized: Well developed,Obese male in no acute distress  Head: normocephalic and atraumatic,. Oropharynx benign  Neck: Supple, no carotid bruits  Cardiac: Regular rate rhythm, no murmur  Musculoskeletal: No deformity   Neurological examination   Mentation: Alert  Attention span and concentration appropriate.  Follows all commands speech and language fluent.   Cranial nerve II-XII: . extraocular movements not complete right gaze still left gaze preference  visual field were limited on confrontational test. Left eye blind Facial sensation and strength were normal. hearing was intact to finger rubbing bilaterally. Uvula tongue midline. head turning and shoulder shrug were normal and symmetric.Tongue protrusion into cheek strength was normal. Motor: normal bulk and tone, full strength in the BUE, BLE, fine finger movements normal, no pronator drift. No focal weakness Sensory: normal and symmetric to light touch, pinprick, and  Vibration, in the upper and lower extremities Coordination: finger-nose-finger, heel-to-shin bilaterally, no dysmetria, no tremor Reflexes: 1+ upper lower and symmetric, plantar responses were flexor bilaterally. Gait and Station: Rising up from seated position with stand by assist ambulated 35 feet in hall  steady gait  DIAGNOSTIC DATA (LABS, IMAGING, TESTING) - I reviewed patient records, labs, notes, testing and imaging myself where available.  Lab Results  Component Value Date   WBC 4.8 02/26/2017   HGB 13.6 02/26/2017   HCT 39.4 02/26/2017   MCV 94.5 02/26/2017   PLT 317 02/26/2017      Component Value Date/Time   NA 139 06/04/2017 1503   K 3.8 06/04/2017 1503   CL 98 06/04/2017 1503   CO2 24 06/04/2017 1503   GLUCOSE 78 06/04/2017 1503   GLUCOSE 164 (H) 02/26/2017 0652   BUN 16 06/04/2017 1503   CREATININE 1.07 06/04/2017 1503   CREATININE 0.93 01/07/2015 1039   CALCIUM 9.9 06/04/2017 1503   PROT 6.9 02/20/2017 1012    PROT 7.3 09/20/2016 1531   ALBUMIN 3.6 02/20/2017 1012   ALBUMIN 4.5 09/20/2016 1531   AST 48 (H) 02/20/2017 1012   ALT 30 02/20/2017 1012   ALKPHOS 61 02/20/2017 1012   BILITOT 0.6 02/20/2017 1012  BILITOT 0.2 09/20/2016 1531   GFRNONAA 78 06/04/2017 1503   GFRNONAA >89 01/07/2015 1039   GFRAA 90 06/04/2017 1503   GFRAA >89 01/07/2015 1039   Lab Results  Component Value Date   CHOL 225 (H) 02/17/2017   HDL 31 (L) 02/17/2017   LDLCALC 159 (H) 02/17/2017   TRIG 174 (H) 02/17/2017   CHOLHDL 7.3 02/17/2017   Lab Results  Component Value Date   HGBA1C 5.7 09/03/2017    ASSESSMENT AND PLAN  56 y.o. year old male  has a past medical history of Blind; Diabetes mellitus; High cholesterol; Hypertension; Substance abuse; and Tobacco use disorder (10/07/2014). here for follow-up for posterior left MCA territory infarct.The patient is a current patient of Dr. Erlinda Church who is out of the office today . This note is sent to the work in doctor.     PLAN:Stressed the importance of management of risk factors to prevent further stroke Continue Aspirin 311m for  secondary stroke prevention Maintain strict control of hypertension with blood pressure goal below 130/90, today's reading107,62 continue antihypertensive medications Control of diabetes with hemoglobin A1c below 6.5 followed by primary care most recent hemoglobin A1c5.7continue diabetic medications Cholesterol with LDL cholesterol less than 70, followed by primary care,  continue statin drugs Lipitor Continue walking for exercise for 30 min eat healthy diet with whole grains,  fresh fruits and vegetables sees diettician Cardiac event monitor shows normal sinus rhythm  Discharge from stroke clinic and continue followup with PCP for stroke risk management I spent 25 minutes in total face to face time with the patient more than 50% of which was spent counseling and coordination of care, reviewing test results reviewing medications and discussing  and reviewing the diagnosis of stroke and management of risk factors.  Given written information , NDennie Bible G[facility] APRN  [facility] [address] SSan Juan CapistranoGArmorel Conner [phone]

## 2017-09-27 NOTE — Telephone Encounter (Signed)
Thank you helen.  

## 2017-10-01 ENCOUNTER — Ambulatory Visit (INDEPENDENT_AMBULATORY_CARE_PROVIDER_SITE_OTHER): Payer: Medicaid Other | Admitting: Internal Medicine

## 2017-10-01 ENCOUNTER — Encounter: Payer: Self-pay | Admitting: Nurse Practitioner

## 2017-10-01 ENCOUNTER — Encounter: Payer: Self-pay | Admitting: Internal Medicine

## 2017-10-01 ENCOUNTER — Ambulatory Visit: Payer: Medicaid Other | Admitting: Nurse Practitioner

## 2017-10-01 VITALS — BP 107/62 | HR 68 | Ht 68.0 in | Wt 217.0 lb

## 2017-10-01 VITALS — BP 107/60 | HR 76 | Temp 98.4°F | Wt 217.8 lb

## 2017-10-01 DIAGNOSIS — H548 Legal blindness, as defined in USA: Secondary | ICD-10-CM | POA: Diagnosis not present

## 2017-10-01 DIAGNOSIS — E1142 Type 2 diabetes mellitus with diabetic polyneuropathy: Secondary | ICD-10-CM | POA: Diagnosis not present

## 2017-10-01 DIAGNOSIS — E1136 Type 2 diabetes mellitus with diabetic cataract: Secondary | ICD-10-CM | POA: Diagnosis not present

## 2017-10-01 DIAGNOSIS — Z7984 Long term (current) use of oral hypoglycemic drugs: Secondary | ICD-10-CM

## 2017-10-01 DIAGNOSIS — E785 Hyperlipidemia, unspecified: Secondary | ICD-10-CM

## 2017-10-01 DIAGNOSIS — Z79899 Other long term (current) drug therapy: Secondary | ICD-10-CM | POA: Diagnosis not present

## 2017-10-01 DIAGNOSIS — Z8673 Personal history of transient ischemic attack (TIA), and cerebral infarction without residual deficits: Secondary | ICD-10-CM | POA: Diagnosis not present

## 2017-10-01 DIAGNOSIS — I10 Essential (primary) hypertension: Secondary | ICD-10-CM

## 2017-10-01 DIAGNOSIS — R0982 Postnasal drip: Secondary | ICD-10-CM | POA: Diagnosis not present

## 2017-10-01 DIAGNOSIS — F17211 Nicotine dependence, cigarettes, in remission: Secondary | ICD-10-CM | POA: Diagnosis not present

## 2017-10-01 DIAGNOSIS — R05 Cough: Secondary | ICD-10-CM | POA: Diagnosis not present

## 2017-10-01 DIAGNOSIS — G6289 Other specified polyneuropathies: Secondary | ICD-10-CM

## 2017-10-01 MED ORDER — GABAPENTIN 300 MG PO CAPS: 600.0000 mg | ORAL_CAPSULE | Freq: Two times a day (BID) | ORAL | 2 refills | Status: DC

## 2017-10-01 MED ORDER — FLUTICASONE PROPIONATE 50 MCG/ACT NA SUSP: 2.0000 | Freq: Every day | NASAL | 2 refills | Status: DC

## 2017-10-01 NOTE — Assessment & Plan Note (Signed)
Patient has symptoms of numbness and tingling in a stocking glove distribution consistent with diabetic neuropathy. He was previously started on gabapentin with improvement in his symptoms but recently has been confused about the correct dosing. Previously prescribed 600 mg TID, reports he as been taking 900 mg (300 mg tab x 3) once daily in the morning. Reports improvement in his symptoms, but that its effects wear off in the evening. We will change to 600 mg BID today. Sent new prescription to the pharmacy. Patient verbalized understanding of new dosing regiment.  -- Gabapentin 600 mg BID

## 2017-10-01 NOTE — Assessment & Plan Note (Signed)
Well controlled today, 107/60. Continue current regimen.  -- Lisinopril 40 mg daily -- HCTZ 12.5 mg daily

## 2017-10-01 NOTE — Assessment & Plan Note (Addendum)
Patient endorses cough with post nasal drip. Will do flonase 2 sprays each nostril daily. Prescription sent to pharmacy, f/u as needed.

## 2017-10-01 NOTE — Progress Notes (Signed)
   CC: DM follow up  HPI:  Mr.Brady Church is a 56 y.o. male with past medical history outlined below here for DM follow up. For the details of today's visit, please refer to the assessment and plan.  Past Medical History:  Diagnosis Date  . 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 Diabetes mellitus   . High cholesterol   . Hypertension   . Substance abuse ([facility])    cocaine. Stopped in 2010  . Tobacco use disorder 10/07/2014    Review of Systems  Respiratory: Negative for shortness of breath.   Cardiovascular: Negative for chest pain.    Physical Exam:  Vitals:   10/01/17 1401  BP: 107/60  Pulse: 76  Temp: 98.4 F (36.9 C)  TempSrc: Oral  SpO2: 98%  Weight: 217 lb 12.8 oz (98.8 kg)    Constitutional: NAD, appears comfortable Cardiovascular: RRR, no murmurs, rubs, or gallops.  Pulmonary/Chest: CTAB, no wheezes, rales, or rhonchi.  Extremities: Warm and well perfused. No edema.  Psychiatric: Normal mood and affect    Assessment & Plan:   See Encounters Tab for problem based charting.  Patient discussed with Dr. Josem Kaufmann

## 2017-10-01 NOTE — Patient Instructions (Signed)
Brady Church,  It was a pleasure to see you today.   For your diabetes, you may stop your insulin, congratulations! You have done an excellent job managing your blood sugar. Please continue to take metformin 1,000 mg twice a day.  For your neuropathy pain, please take gabapentin 600 mg (2 tablets) twice a day. I have sent a new prescription to your pharmacy.   For your cough and sinus congestion, please use flonase, 2 sprays each nostril daily.   Follow up with me in 3 months for your diabetes. If you have any questions or concerns, call our clinic at [phone] or after hours call [phone] and ask for the internal medicine resident on call. Thank you!  - Dr. Antony Contras

## 2017-10-01 NOTE — Assessment & Plan Note (Addendum)
Patient has a history of type II DM, previously uncontrolled with A1c 14.4 earlier this year. He has been actively dieting, losing weight, and walking and A1c has decreased to 5.7, last checked 1 month ago. We have been tapering his lantus. Previously on 25 units, currently using 10 units QHS. Review of his glucometer today shows 100% of readings are within target range. Patient is legally blind and requires a caretaker to help administer his lantus. I congratulated patient today on his progressed and encouraged him to keep up the hard work. We will stop insulin today. Informed patient that he may stop CBG checks unless he becomes symptomatic. Will continue with metformin 1,000 mg BID, recheck in 3 months.  -- Stop lantus -- Continue metformin 1,000 mg BID -- F/u 3 months

## 2017-10-01 NOTE — Patient Instructions (Addendum)
Stressed the importance of management of risk factors to prevent further stroke Continue Aspirin 325mg  for  secondary stroke prevention Maintain strict control of hypertension with blood pressure goal below 130/90, today's reading107,62 continue antihypertensive medications Control of diabetes with hemoglobin A1c below 6.5 followed by primary care most recent hemoglobin A1c5.7continue diabetic medications Cholesterol with LDL cholesterol less than 70, followed by primary care,  continue statin drugs Lipitor Continue walking for exercise for 30 min eat healthy diet with whole grains,  fresh fruits and vegetables sees diettician Cardiac event monitor shows normal sinus rhythm  Discharge from stroke clinic and continue followup with PCP for stroke risk management  Stroke Prevention Some health problems and behaviors may make it more likely for you to have a stroke. Below are ways to lessen your risk of having a stroke.  Be active for at least 30 minutes on most or all days.  Do not smoke. Try not to be around others who smoke.  Do not drink too much alcohol. ? Do not have more than 2 drinks a day if you are a man. ? Do not have more than 1 drink a day if you are a woman and are not pregnant.  Eat healthy foods, such as fruits and vegetables. If you were put on a specific diet, follow the diet as told.  Keep your cholesterol levels under control through diet and medicines. Look for foods that are low in saturated fat, trans fat, cholesterol, and are high in fiber.  If you have diabetes, follow all diet plans and take your medicine as told.  Ask your doctor if you need treatment to lower your blood pressure. If you have high blood pressure (hypertension), follow all diet plans and take your medicine as told by your doctor.  If you are 1918-55 years old, have your blood pressure checked every 3-5 years. If you are age 55 or older, have your blood pressure checked every year.  Keep a healthy  weight. Eat foods that are low in calories, salt, saturated fat, trans fat, and cholesterol.  Do not take drugs.  Avoid birth control pills, if this applies. Talk to your doctor about the risks of taking birth control pills.  Talk to your doctor if you have sleep problems (sleep apnea).  Take all medicine as told by your doctor. ? You may be told to take aspirin or blood thinner medicine. Take this medicine as told by your doctor. ? Understand your medicine instructions.  Make sure any other conditions you have are being taken care of.  Get help right away if:  You suddenly lose feeling (you feel numb) or have weakness in your face, arm, or leg.  Your face or eyelid hangs down to one side.  You suddenly feel confused.  You have trouble talking (aphasia) or understanding what people are saying.  You suddenly have trouble seeing in one or both eyes.  You suddenly have trouble walking.  You are dizzy.  You lose your balance or your movements are clumsy (uncoordinated).  You suddenly have a very bad headache and you do not know the cause.  You have new chest pain.  Your heart feels like it is fluttering or skipping a beat (irregular heartbeat). Do not wait to see if the symptoms above go away. Get help right away. Call your local emergency services (911 in [place] Do not drive yourself to the hospital. This information is not intended to replace advice given to you by your health care  provider. Make sure you discuss any questions you have with your health care provider. Document Released: 05/14/2012 Document Revised: 04/20/2016 Document Reviewed: 05/16/2013 Elsevier Interactive Patient Education  [employer].

## 2017-10-01 NOTE — Progress Notes (Signed)
I agree with the above plan 

## 2017-10-05 NOTE — Progress Notes (Signed)
Case discussed with Dr. Guilloud at the time of the visit.  We reviewed the resident's history and exam and pertinent patient test results.  I agree with the assessment, diagnosis and plan of care documented in the resident's note. 

## 2017-10-09 ENCOUNTER — Ambulatory Visit: Payer: Medicaid Other | Admitting: Nurse Practitioner

## 2017-10-11 ENCOUNTER — Encounter: Payer: Self-pay | Admitting: Dietician

## 2017-10-11 ENCOUNTER — Ambulatory Visit: Payer: Medicaid Other | Admitting: Dietician

## 2017-10-11 DIAGNOSIS — E1136 Type 2 diabetes mellitus with diabetic cataract: Secondary | ICD-10-CM

## 2017-10-11 NOTE — Patient Instructions (Signed)
Good job taking care of your self Waddell!  Keep walking and making healthy food choices!  See you December 20th.

## 2017-10-11 NOTE — Progress Notes (Addendum)
Diabetes Self Management Education and Support: Start time: 10:15 am  end time: 11:15 am  Patient attended diabetes education group visit today for 60 minutes. The program included successes and challenges over the past few weeks, goal setting and evaluation, and how to stay on track over the holidays.     Blood sugars: no meter today- he reports his morning blood sugars are 130-150 off all insulin.  Health maintenance: up to date except ophthalmology and patient went in the past few months. Will request report  Weight- stable at 217# Plan: patient plans to attend group diabetes meeting in 1 month. Sharda Keddy, [name], RD, CDE 10/11/2017 5:53 PM.

## 2017-10-26 ENCOUNTER — Other Ambulatory Visit: Payer: Self-pay | Admitting: Internal Medicine

## 2017-10-26 ENCOUNTER — Other Ambulatory Visit: Payer: Self-pay | Admitting: Oncology

## 2017-10-30 NOTE — Telephone Encounter (Signed)
Got one yr supply folate in May. I filled the other 2

## 2017-10-30 NOTE — Telephone Encounter (Signed)
Pt states he needs this meds by today. Per patient he had contacted the pharmacy since 10/26/2017, and still not hear anything back from the office. Please call pt back.

## 2017-10-30 NOTE — Telephone Encounter (Signed)
folic acid (FOLVITE) 1 MG tablet,  hydrochlorothiazide (MICROZIDE) 12.5 MG capsule,  lisinopril (PRINIVIL,ZESTRIL) 40 MG tablet, refill request.

## 2017-11-07 ENCOUNTER — Telehealth: Payer: Self-pay | Admitting: Internal Medicine

## 2017-11-07 NOTE — Telephone Encounter (Signed)
Pt has questions about his Eye Medications.

## 2017-11-08 ENCOUNTER — Telehealth: Payer: Self-pay | Admitting: Internal Medicine

## 2017-11-08 NOTE — Telephone Encounter (Signed)
Spoke w/ pt, he realized that he needed to call dr Dione Booze not his pcp

## 2017-11-08 NOTE — Telephone Encounter (Signed)
Called pt, he states he got mixed up, states he should have called dr groat's office and will do that now

## 2017-11-08 NOTE — Telephone Encounter (Signed)
Patient missing one of his medicine

## 2017-11-13 ENCOUNTER — Ambulatory Visit: Payer: Medicaid Other | Admitting: Podiatry

## 2017-11-13 ENCOUNTER — Encounter: Payer: Self-pay | Admitting: Podiatry

## 2017-11-13 DIAGNOSIS — B351 Tinea unguium: Secondary | ICD-10-CM

## 2017-11-13 DIAGNOSIS — M79676 Pain in unspecified toe(s): Secondary | ICD-10-CM | POA: Diagnosis not present

## 2017-11-13 DIAGNOSIS — M2010 Hallux valgus (acquired), unspecified foot: Secondary | ICD-10-CM

## 2017-11-13 DIAGNOSIS — E1142 Type 2 diabetes mellitus with diabetic polyneuropathy: Secondary | ICD-10-CM

## 2017-11-13 NOTE — Progress Notes (Signed)
Complaint:  Visit Type: Patient returns to my office for continued preventative foot care services. Complaint: Patient states" my nails have grown long and thick and become painful to walk and wear shoes" Patient has been diagnosed with DM with neuropathy. The patient presents for preventative foot care services. No changes to ROS  Podiatric Exam: Vascular: dorsalis pedis and posterior tibial pulses are palpable bilateral. Capillary return is immediate. Temperature gradient is WNL. Skin turgor WNL  Sensorium: Diminished  Semmes Weinstein monofilament test. Normal tactile sensation bilaterally. Nail Exam: Pt has thick disfigured discolored nails with subungual debris noted bilateral entire nail hallux through fifth toenails Ulcer Exam: There is no evidence of ulcer or pre-ulcerative changes or infection. Orthopedic Exam: Muscle tone and strength are WNL. No limitations in general ROM. No crepitus or effusions noted. Hammer second B/L.  HAV  Left. Skin: No Porokeratosis. No infection or ulcers  Diagnosis:  Onychomycosis, , Pain in right toe, pain in left toes  Treatment & Plan Procedures and Treatment: Consent by patient was obtained for treatment procedures. The patient understood the discussion of treatment and procedures well. All questions were answered thoroughly reviewed. Debridement of mycotic and hypertrophic toenails, 1 through 5 bilateral and clearing of subungual debris. No ulceration, no infection noted.  Return Visit-Office Procedure: Patient instructed to return to the office for a follow up visit 3 months for continued evaluation and treatment.    Helane Gunther DPM

## 2017-11-15 ENCOUNTER — Ambulatory Visit: Payer: Medicaid Other | Admitting: Dietician

## 2017-11-15 ENCOUNTER — Other Ambulatory Visit: Payer: Self-pay | Admitting: Oncology

## 2017-11-26 ENCOUNTER — Ambulatory Visit: Payer: Medicaid Other | Admitting: Podiatry

## 2017-11-29 ENCOUNTER — Other Ambulatory Visit: Payer: Self-pay | Admitting: Oncology

## 2017-12-13 ENCOUNTER — Encounter: Payer: Self-pay | Admitting: Dietician

## 2017-12-13 ENCOUNTER — Ambulatory Visit (INDEPENDENT_AMBULATORY_CARE_PROVIDER_SITE_OTHER): Payer: Medicaid Other | Admitting: Dietician

## 2017-12-13 DIAGNOSIS — E1136 Type 2 diabetes mellitus with diabetic cataract: Secondary | ICD-10-CM | POA: Diagnosis not present

## 2017-12-14 NOTE — Patient Instructions (Addendum)
You are taking great care of your diabetes!  See you January 17, 2018.  [name] [phone]

## 2017-12-14 NOTE — Progress Notes (Signed)
Diabetes Self-Management Education  Visit Type: Follow-up  Appt. Start Time: 1015 Appt. End Time: 1120  12/14/2017  Mr. Brady Church, identified by name and date of birth, is a 57 y.o. male with a diagnosis of Diabetes:  Marland Kitchen  Type 2 diabetes  ASSESSMENT   Patient attended diabetes education group visit today for 60 minutes. The program included successes and challenges over the past few weeks, goal setting and evaluation, and what to eat food activity. .     Blood sugars: brought meter this month were: 105/104/124/97/112/96/98/104/107/110  Health maintenance: up to date except ophthalmology and patient went in the past few months. Will request report  Weight- stable   Weight 216 lb (98 kg). Body mass index is 32.84 kg/m.  Diabetes Self-Management Education - 12/14/17 1300      Visit Information   Visit Type  Follow-up      Health Coping   How would you rate your overall health?  Good      Psychosocial Assessment   Patient Belief/Attitude about Diabetes  Motivated to manage diabetes    Self-care barriers  Impaired vision;Lack of transportation;Lack of material resources    Self-management support  Doctor's office;Friends;CDE visits    Patient Concerns  Healthy Lifestyle;Glycemic Control    Special Needs  Large print;Verbal instruction    Preferred Learning Style  Auditory;Hands on    Learning Readiness  Ready      Pre-Education Assessment   Patient understands incorporating nutritional management into lifestyle.  Needs Review      Complications   How often do you check your blood sugar?  1-2 times/day    Fasting Blood glucose range (mg/dL)  14-782    Number of hypoglycemic episodes per month  0    Number of hyperglycemic episodes per week  0    Have you had a dilated eye exam in the past 12 months?  Yes groat- need report    Are you checking your feet?  Yes he has a friend do this    How many days per week are you checking your feet?  5      Exercise   Exercise  Type  ADL's;Light (walking / raking leaves)    How many days per week to you exercise?  7    How many minutes per day do you exercise?  30    Total minutes per week of exercise  210      Patient Education   Nutrition management   Role of diet in the treatment of diabetes and the relationship between the three main macronutrients and blood glucose level      Individualized Goals (developed by patient)   Nutrition  Follow meal plan discussed      Patient Self-Evaluation of Goals - Patient rates self as meeting previously set goals (% of time)   Nutrition  >75%      Post-Education Assessment   Patient understands incorporating nutritional management into lifestyle.  Demonstrates understanding / competency      Outcomes   Expected Outcomes  Demonstrated interest in learning. Expect positive outcomes    Future DMSE  4-6 wks    Program Status  Completed      Subsequent Visit   Since your last visit have you continued or begun to take your medications as prescribed?  Yes    Since your last visit have you had your blood pressure checked?  Yes    Is your most recent blood pressure lower, unchanged, or  higher since your last visit?  Unchanged    Since your last visit have you experienced any weight changes?  No change    Since your last visit, are you checking your blood glucose at least once a day?  Yes       Individualized Plan for Diabetes Self-Management Training:   Learning Objective:  Patient will have a greater understanding of diabetes self-management. Patient education plan is to attend individual and/or group sessions per assessed needs and concerns.   Plan:   There are no Patient Instructions on file for this visit.  Expected Outcomes:  Demonstrated interest in learning. Expect positive outcomes  Education material provided: My Plate  If problems or questions, patient to contact team via:  Phone  Future DSME appointment: 4-6 wks Norm Parcel, RD, CDE 12/14/2017 1:53  PM.

## 2018-01-04 ENCOUNTER — Other Ambulatory Visit: Payer: Self-pay | Admitting: *Deleted

## 2018-01-04 MED ORDER — GABAPENTIN 300 MG PO CAPS: 600.0000 mg | ORAL_CAPSULE | Freq: Two times a day (BID) | ORAL | 2 refills | Status: DC

## 2018-01-07 ENCOUNTER — Encounter: Payer: Self-pay | Admitting: Internal Medicine

## 2018-01-07 ENCOUNTER — Ambulatory Visit (INDEPENDENT_AMBULATORY_CARE_PROVIDER_SITE_OTHER): Payer: Medicaid Other | Admitting: Internal Medicine

## 2018-01-07 VITALS — BP 108/64 | HR 83 | Temp 98.0°F | Wt 217.4 lb

## 2018-01-07 DIAGNOSIS — E1142 Type 2 diabetes mellitus with diabetic polyneuropathy: Secondary | ICD-10-CM | POA: Diagnosis present

## 2018-01-07 DIAGNOSIS — Z23 Encounter for immunization: Secondary | ICD-10-CM | POA: Diagnosis not present

## 2018-01-07 DIAGNOSIS — E1136 Type 2 diabetes mellitus with diabetic cataract: Secondary | ICD-10-CM

## 2018-01-07 DIAGNOSIS — F17211 Nicotine dependence, cigarettes, in remission: Secondary | ICD-10-CM

## 2018-01-07 DIAGNOSIS — Z0000 Encounter for general adult medical examination without abnormal findings: Secondary | ICD-10-CM

## 2018-01-07 DIAGNOSIS — I10 Essential (primary) hypertension: Secondary | ICD-10-CM | POA: Diagnosis not present

## 2018-01-07 DIAGNOSIS — Z79899 Other long term (current) drug therapy: Secondary | ICD-10-CM | POA: Diagnosis not present

## 2018-01-07 DIAGNOSIS — Z7984 Long term (current) use of oral hypoglycemic drugs: Secondary | ICD-10-CM

## 2018-01-07 DIAGNOSIS — G6289 Other specified polyneuropathies: Secondary | ICD-10-CM

## 2018-01-07 LAB — GLUCOSE, CAPILLARY: GLUCOSE-CAPILLARY: 137 mg/dL — AB (ref 65–99)

## 2018-01-07 LAB — POCT GLYCOSYLATED HEMOGLOBIN (HGB A1C): HEMOGLOBIN A1C: 5.8

## 2018-01-07 MED ORDER — GABAPENTIN 300 MG PO CAPS: 600.0000 mg | ORAL_CAPSULE | Freq: Three times a day (TID) | ORAL | 5 refills | Status: DC

## 2018-01-07 NOTE — Progress Notes (Signed)
   CC: HTN, DM follow up  HPI:  Mr.Brady Church is a 57 y.o. male with past medical history outlined below here for HTN and DM follow up. For the details of today's visit, please refer to the assessment and plan.  Past Medical History:  Diagnosis Date  . 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 Diabetes mellitus   . High cholesterol   . Hypertension   . Substance abuse (HCC)    cocaine. Stopped in 2010  . Tobacco use disorder 10/07/2014    Review of Systems  Respiratory: Negative for shortness of breath.   Cardiovascular: Negative for chest pain.    Physical Exam:  Vitals:   01/07/18 1514  BP: 108/64  Pulse: 83  Temp: 98 F (36.7 C)  TempSrc: Oral  SpO2: 100%  Weight: 217 lb 6.4 oz (98.6 kg)    Constitutional: NAD, appears comfortable Cardiovascular:RRR, no murmurs, rubs, or gallops.  Pulmonary/Chest:CTAB, no wheezes, rales, or rhonchi.  Extremities: Warm and well perfused. No edema.  Psychiatric:Normal mood and affect    Assessment & Plan:   See Encounters Tab for problem based charting.  Patient discussed with Dr. Heide Spark

## 2018-01-07 NOTE — Patient Instructions (Signed)
Brady Church,   It was a pleasure to see you. You are doing a great job with your diabetes! Keep up the good work. Please continue to take metformin, lisinopril, and hydrochlorothiazide as previously prescribed.   For your neuropathy (hand and feet pain), I have increased your gabapentin to 2 pills, three times a day.   You may follow up with me again in 6 months or sooner if needed. If you have any questions or concerns, call our clinic at [phone] or after hours call [phone] and ask for the internal medicine resident on call. Thank you!  - Dr. Antony Contras

## 2018-01-08 ENCOUNTER — Encounter: Payer: Self-pay | Admitting: Internal Medicine

## 2018-01-08 NOTE — Assessment & Plan Note (Signed)
Well controlled today, 108/64. Continue current regimen. -- Lisinopril 40 -- HCTZ 25

## 2018-01-08 NOTE — Assessment & Plan Note (Signed)
Lantus stopped at last visit in November, A1c today unchanged 5.7. Encouraged patient to keep up the good work with his diet and exercise. Will continue metformin for now, consider decreasing dose at next visit.  -- Metformin 1,000 mg BID -- Follow up 6 months

## 2018-01-08 NOTE — Assessment & Plan Note (Signed)
Received flu shot today, declined Tdap. Has an appointment with his ophthalmologist on March 1st for his diabetic eye exam. A1c also done today. Otherwise up to date with healthcare maintenance.

## 2018-01-08 NOTE — Assessment & Plan Note (Signed)
Patient has diabetic neuropathy in his bilateral feet and hands. We have been up-titrating his gabapentin, currently taking 600 mg BID. Reports he does get relief from the gabapentin but that it wears off after a few hours. Agreeable to increase to three times daily.  -- Increase Gabapentin 600 mg TID

## 2018-01-09 NOTE — Progress Notes (Signed)
Internal Medicine Clinic Attending  Case discussed with Dr. Guilloud at the time of the visit.  We reviewed the resident's history and exam and pertinent patient test results.  I agree with the assessment, diagnosis, and plan of care documented in the resident's note.  

## 2018-01-17 ENCOUNTER — Ambulatory Visit (INDEPENDENT_AMBULATORY_CARE_PROVIDER_SITE_OTHER): Payer: Medicaid Other | Admitting: Dietician

## 2018-01-17 DIAGNOSIS — E1136 Type 2 diabetes mellitus with diabetic cataract: Secondary | ICD-10-CM

## 2018-01-17 DIAGNOSIS — Z713 Dietary counseling and surveillance: Secondary | ICD-10-CM | POA: Diagnosis not present

## 2018-01-17 DIAGNOSIS — E119 Type 2 diabetes mellitus without complications: Secondary | ICD-10-CM | POA: Diagnosis not present

## 2018-01-18 ENCOUNTER — Encounter: Payer: Self-pay | Admitting: Dietician

## 2018-01-18 NOTE — Progress Notes (Signed)
Diabetes Self-Management Education and Support follow Up  Visit Type:  Follow up  Appt. Start Time: 1015 Appt. End Time: 1120  01/18/2018  Mr. Brady Church, identified by name and date of birth, is a 57 y.o. male with a diagnosis of Diabetes:  Marland Kitchen  Type 2 diabetes  ASSESSMENT  Patient attended diabetes education group visit today for 60 minutes. The program included successes and challenges over the past few weeks, and how to prevent and care for long tern complications.     Blood sugars:  Lab Results  Component Value Date   HGBA1C 5.8 01/07/2018   Current Outpatient Medications on File Prior to Visit  Medication Sig Dispense Refill  . aspirin 325 MG EC tablet Take 1 tablet (325 mg total) by mouth daily. 100 tablet 0  . atorvastatin (LIPITOR) 80 MG tablet Take 1 tablet (80 mg total) by mouth daily at 6 PM. 30 tablet 11  . brimonidine (ALPHAGAN) 0.2 % ophthalmic solution Place 1 drop into both eyes 2 (two) times daily. 5 mL 12  . dorzolamide-timolol (COSOPT) 22.3-6.8 MG/ML ophthalmic solution Place 1 drop into the right eye 2 (two) times daily. 10 mL 3  . fluticasone (FLONASE) 50 MCG/ACT nasal spray Place 2 sprays daily into both nostrils. 16 g 2  . folic acid (FOLVITE) 1 MG tablet Take 1 tablet (1 mg total) by mouth daily. 30 tablet 11  . gabapentin (NEURONTIN) 300 MG capsule Take 2 capsules (600 mg total) by mouth 3 (three) times daily. 180 capsule 5  . hydrochlorothiazide (HYDRODIURIL) 25 MG tablet TAKE 1 TABLET BY MOUTH ONCE DAILY 30 tablet 11  . ibuprofen (ADVIL,MOTRIN) 600 MG tablet Take 1 tablet (600 mg total) by mouth every 8 (eight) hours as needed. 60 tablet 0  . lisinopril (PRINIVIL,ZESTRIL) 40 MG tablet TAKE 1 TABLET BY MOUTH ONCE DAILY 30 tablet 11  . metFORMIN (GLUCOPHAGE) 1000 MG tablet TAKE 1 TABLET BY MOUTH TWICE DAILY WITH MEALS 180 tablet 0  . Multiple Vitamin (MULTIVITAMIN WITH MINERALS) TABS tablet Take 1 tablet by mouth daily. 100 tablet 0  . senna-docusate  (SENOKOT-S) 8.6-50 MG tablet Take 1 tablet by mouth 2 (two) times daily. 30 tablet 0  . thiamine 100 MG tablet Take 1 tablet (100 mg total) by mouth daily. 30 tablet 11   No current facility-administered medications on file prior to visit.    Health maintenance: up to date except ophthalmology- report in medical records Weight- stable   Individualized Plan for Diabetes Self-Management Training:   Learning Objective:  Patient will have a greater understanding of diabetes self-management. Patient education plan is to attend individual and/or group sessions per assessed needs and concerns.   Plan:   There are no Patient Instructions on file for this visit.  Expected Outcomes:     Education material provided: [facility] Flyer  If problems or questions, patient to contact team via:  Phone  Future DSME appointment:   1 month Norm Parcel, RD, CDE 01/18/2018 3:48 PM.

## 2018-01-21 NOTE — Addendum Note (Signed)
Addended by: Burnell Blanks on: 01/21/2018 01:17 PM   Modules accepted: Orders

## 2018-01-31 ENCOUNTER — Other Ambulatory Visit: Payer: Self-pay | Admitting: Physical Medicine and Rehabilitation

## 2018-02-01 ENCOUNTER — Ambulatory Visit: Payer: Medicaid Other | Admitting: Podiatry

## 2018-02-01 ENCOUNTER — Encounter: Payer: Self-pay | Admitting: Podiatry

## 2018-02-01 DIAGNOSIS — M79676 Pain in unspecified toe(s): Secondary | ICD-10-CM | POA: Diagnosis not present

## 2018-02-01 DIAGNOSIS — M2041 Other hammer toe(s) (acquired), right foot: Secondary | ICD-10-CM

## 2018-02-01 DIAGNOSIS — B351 Tinea unguium: Secondary | ICD-10-CM

## 2018-02-01 DIAGNOSIS — E1142 Type 2 diabetes mellitus with diabetic polyneuropathy: Secondary | ICD-10-CM

## 2018-02-01 DIAGNOSIS — M2010 Hallux valgus (acquired), unspecified foot: Secondary | ICD-10-CM

## 2018-02-01 DIAGNOSIS — M2042 Other hammer toe(s) (acquired), left foot: Secondary | ICD-10-CM

## 2018-02-01 NOTE — Progress Notes (Signed)
Complaint:  Visit Type: Patient returns to my office for continued preventative foot care services. Complaint: Patient states" my nails have grown long and thick and become painful to walk and wear shoes" Patient has been diagnosed with DM with neuropathy. The patient presents for preventative foot care services. No changes to ROS  Podiatric Exam: Vascular: dorsalis pedis and posterior tibial pulses are palpable bilateral. Capillary return is immediate. Temperature gradient is WNL. Skin turgor WNL  Sensorium: Diminished  Semmes Weinstein monofilament test. Normal tactile sensation bilaterally. Nail Exam: Pt has thick disfigured discolored nails with subungual debris noted bilateral entire nail hallux through fifth toenails Ulcer Exam: There is no evidence of ulcer or pre-ulcerative changes or infection. Orthopedic Exam: Muscle tone and strength are WNL. No limitations in general ROM. No crepitus or effusions noted. Hammer second B/L.  HAV  Left. Skin: No Porokeratosis. No infection or ulcers  Diagnosis:  Onychomycosis, , Pain in right toe, pain in left toes  Treatment & Plan Procedures and Treatment: Consent by patient was obtained for treatment procedures. The patient understood the discussion of treatment and procedures well. All questions were answered thoroughly reviewed. Debridement of mycotic and hypertrophic toenails, 1 through 5 bilateral and clearing of subungual debris. No ulceration, no infection noted. 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 To be scheduled with Betha. Return Visit-Office Procedure: Patient instructed to return to the office for a follow up visit 3 months for continued evaluation and treatment.    Helane Gunther DPM

## 2018-02-07 ENCOUNTER — Telehealth: Payer: Self-pay | Admitting: *Deleted

## 2018-02-07 NOTE — Telephone Encounter (Signed)
Received faxed request from [facility] for Accu-Chek Aviva plus kit to use as directed three times daily. L. Ducatte, RN, BSN   

## 2018-02-07 NOTE — Telephone Encounter (Signed)
Most recent Hgb A1C after discontinuance of insulin was 5.8 on 01/07/2018.  Currently only on metformin.  No indication for checking blood glucose at this time as at this level; frequent blood glucose checks do not improve glycemic control, but increase costs without benefit.  I will therefore not approve this request.  Thank You.

## 2018-02-11 ENCOUNTER — Ambulatory Visit: Payer: Medicaid Other | Admitting: Dietician

## 2018-02-12 ENCOUNTER — Ambulatory Visit: Payer: Medicaid Other | Admitting: Podiatry

## 2018-02-14 ENCOUNTER — Ambulatory Visit: Payer: Medicaid Other | Admitting: Dietician

## 2018-02-26 ENCOUNTER — Other Ambulatory Visit: Payer: Medicaid Other

## 2018-03-05 ENCOUNTER — Other Ambulatory Visit: Payer: Medicaid Other

## 2018-03-07 ENCOUNTER — Telehealth: Payer: Self-pay | Admitting: *Deleted

## 2018-03-07 DIAGNOSIS — K219 Gastro-esophageal reflux disease without esophagitis: Secondary | ICD-10-CM

## 2018-03-07 NOTE — Telephone Encounter (Signed)
Fax from [facility] pharmacy for refill on Omeprazole 20 mg Take 1 tab by mouth once daily. Not on current me list - d/c on  02/27/18 by a different provider. Thanks

## 2018-03-08 MED ORDER — OMEPRAZOLE 20 MG PO CPDR: 20.0000 mg | DELAYED_RELEASE_CAPSULE | Freq: Every day | ORAL | 2 refills | Status: DC

## 2018-03-08 NOTE — Telephone Encounter (Signed)
Looks like it was discontinued because he reported he wasn't taking it. I have sent a refill to his pharmacy.

## 2018-04-04 ENCOUNTER — Ambulatory Visit: Payer: Medicaid Other

## 2018-04-05 ENCOUNTER — Ambulatory Visit: Payer: Medicaid Other | Admitting: Podiatry

## 2018-04-15 ENCOUNTER — Other Ambulatory Visit: Payer: Self-pay | Admitting: *Deleted

## 2018-04-16 MED ORDER — FOLIC ACID 1 MG PO TABS: 1.0000 mg | ORAL_TABLET | Freq: Every day | ORAL | 11 refills | Status: DC

## 2018-05-06 ENCOUNTER — Ambulatory Visit (INDEPENDENT_AMBULATORY_CARE_PROVIDER_SITE_OTHER): Payer: Medicaid Other | Admitting: Internal Medicine

## 2018-05-06 ENCOUNTER — Other Ambulatory Visit: Payer: Self-pay

## 2018-05-06 ENCOUNTER — Encounter: Payer: Self-pay | Admitting: Internal Medicine

## 2018-05-06 ENCOUNTER — Encounter (INDEPENDENT_AMBULATORY_CARE_PROVIDER_SITE_OTHER): Payer: Self-pay

## 2018-05-06 VITALS — BP 130/71 | HR 80 | Temp 98.4°F | Ht 68.0 in | Wt 209.1 lb

## 2018-05-06 DIAGNOSIS — G6289 Other specified polyneuropathies: Secondary | ICD-10-CM

## 2018-05-06 DIAGNOSIS — E1140 Type 2 diabetes mellitus with diabetic neuropathy, unspecified: Secondary | ICD-10-CM

## 2018-05-06 DIAGNOSIS — Z79899 Other long term (current) drug therapy: Secondary | ICD-10-CM | POA: Diagnosis not present

## 2018-05-06 DIAGNOSIS — N529 Male erectile dysfunction, unspecified: Secondary | ICD-10-CM | POA: Diagnosis not present

## 2018-05-06 DIAGNOSIS — E785 Hyperlipidemia, unspecified: Secondary | ICD-10-CM

## 2018-05-06 DIAGNOSIS — Z7984 Long term (current) use of oral hypoglycemic drugs: Secondary | ICD-10-CM

## 2018-05-06 DIAGNOSIS — E1136 Type 2 diabetes mellitus with diabetic cataract: Secondary | ICD-10-CM

## 2018-05-06 DIAGNOSIS — I10 Essential (primary) hypertension: Secondary | ICD-10-CM | POA: Diagnosis not present

## 2018-05-06 LAB — POCT GLYCOSYLATED HEMOGLOBIN (HGB A1C): HEMOGLOBIN A1C: 5.6 % (ref 4.0–5.6)

## 2018-05-06 LAB — GLUCOSE, CAPILLARY: Glucose-Capillary: 97 mg/dL (ref 65–99)

## 2018-05-06 MED ORDER — HYDROCHLOROTHIAZIDE 25 MG PO TABS: 25.0000 mg | ORAL_TABLET | Freq: Every day | ORAL | 11 refills | Status: DC

## 2018-05-06 MED ORDER — ATORVASTATIN CALCIUM 80 MG PO TABS: 80.0000 mg | ORAL_TABLET | Freq: Every day | ORAL | 11 refills | Status: DC

## 2018-05-06 MED ORDER — LISINOPRIL 40 MG PO TABS: 40.0000 mg | ORAL_TABLET | Freq: Every day | ORAL | 11 refills | Status: DC

## 2018-05-06 MED ORDER — METFORMIN HCL 1000 MG PO TABS: 1000.0000 mg | ORAL_TABLET | Freq: Two times a day (BID) | ORAL | 3 refills | Status: DC

## 2018-05-06 NOTE — Assessment & Plan Note (Addendum)
Well controlled, A1c today 5.6. Patient has done a great job managing his diabetes with diet and exercise. He walks 3 miles every morning and does push ups and sit ups daily. We were able to successfully stop his insulin this past November. He is compliant with metformin.  -- Continue metformin 1,000 mg BID -- F/u BMP   ADDENDUM: BMP within normal limits

## 2018-05-06 NOTE — Progress Notes (Addendum)
   CC: DM & HTN follow up  HPI:  Mr.Chez Lindau is a 57 y.o. male with past medical history outlined below here for follow up of HTN and DM. For the details of today's visit, please refer to the assessment and plan.  Past Medical History:  Diagnosis Date  . 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 Diabetes mellitus   . High cholesterol   . Hypertension   . Substance abuse (HCC)    cocaine. Stopped in 2010  . Tobacco use disorder 10/07/2014    Review of Systems  Respiratory: Negative for shortness of breath.   Cardiovascular: Negative for chest pain.    Physical Exam:  Vitals:   05/06/18 1315  BP: 130/71  Pulse: 80  Temp: 98.4 F (36.9 C)  TempSrc: Oral  SpO2: 99%  Weight: 209 lb 1.6 oz (94.8 kg)  Height: 5\' 8"  (1.727 m)    Constitutional: NAD, appears comfortable Cardiovascular:RRR, no murmurs, rubs, or gallops.  Pulmonary/Chest:CTAB, no wheezes, rales, or rhonchi.  Extremities: Warm and well perfused. No edema.  Psychiatric:Normal mood and affect   Assessment & Plan:   See Encounters Tab for problem based charting.  Patient discussed with Dr. Oswaldo Done

## 2018-05-06 NOTE — Assessment & Plan Note (Addendum)
Well controlled. BP today 130/71.  -- Continue HCTZ 25 mg daily -- Continue lisinopril 40 mg daily  -- BMP today   ADDENDUM:  BMP within normal limits

## 2018-05-06 NOTE — Assessment & Plan Note (Signed)
Previously on gabapentin. Patient has stopped this as he feels it is ineffective. Symptoms are not bothersome enough at this time for him to try another medications. Diabetes is well controlled.  -- Will monitor

## 2018-05-06 NOTE — Assessment & Plan Note (Signed)
Patient is complaining of ED today and requesting medication. He reports difficulty both achieving and maintaining erections, but this problem is intermittent. He declines symptoms of claudication. His diabetes is well controlled. He has declined genital exam today, prefers to schedule a separate appointment for this. He will need a full assessment for potential etiologies including hypogonadism, vascular, and psychiatric.  -- Follow up as needed

## 2018-05-06 NOTE — Patient Instructions (Signed)
FOLLOW-UP INSTRUCTIONS When: 3 months DM; next available for erectile dysfunction ([facility] or PCP) For: above  What to bring: medications   Brady Church,  It was a pleasure to see you again. You are doing a great job with your diabetes and blood pressure. Keep up the good work! No changes to your medicines today, please continue to take everything as previously prescribed. Follow up with me again in 3 months. Make another appointment at your convenience to follow up on erectile dysfunction. If you have any questions or concerns, call our clinic at [phone] or after hours call [phone] and ask for the internal medicine resident on call.  Thank you!  - Dr. Antony Contras

## 2018-05-06 NOTE — Assessment & Plan Note (Signed)
Lipid Panel     Component Value Date/Time   CHOL 225 (H) 02/17/2017 0448   TRIG 174 (H) 02/17/2017 0448   HDL 31 (L) 02/17/2017 0448   CHOLHDL 7.3 02/17/2017 0448   VLDL 35 02/17/2017 0448   LDLCALC 159 (H) 02/17/2017 0448    Last lipid panel March of 2018 with LDL 159. He is on atorvastatin 80 mg for secondary prevention of CVA, reports compliance.  -- Refilled atorvastatin 80 mg

## 2018-05-07 LAB — BMP8+ANION GAP
Anion Gap: 21 mmol/L — ABNORMAL HIGH (ref 10.0–18.0)
BUN/Creatinine Ratio: 19 (ref 9–20)
BUN: 18 mg/dL (ref 6–24)
CHLORIDE: 97 mmol/L (ref 96–106)
CO2: 22 mmol/L (ref 20–29)
CREATININE: 0.95 mg/dL (ref 0.76–1.27)
Calcium: 9.2 mg/dL (ref 8.7–10.2)
GFR calc Af Amer: 104 mL/min/{1.73_m2} (ref >59)
GFR calc non Af Amer: 90 mL/min/{1.73_m2} (ref >59)
GLUCOSE: 75 mg/dL (ref 65–99)
POTASSIUM: 3.9 mmol/L (ref 3.5–5.2)
SODIUM: 140 mmol/L (ref 134–144)

## 2018-05-07 NOTE — Progress Notes (Signed)
Internal Medicine Clinic Attending  Case discussed with Dr. Guilloud at the time of the visit.  We reviewed the resident's history and exam and pertinent patient test results.  I agree with the assessment, diagnosis, and plan of care documented in the resident's note.  

## 2018-05-08 ENCOUNTER — Encounter: Payer: Self-pay | Admitting: Podiatry

## 2018-05-08 ENCOUNTER — Ambulatory Visit (INDEPENDENT_AMBULATORY_CARE_PROVIDER_SITE_OTHER): Payer: Medicaid Other | Admitting: Podiatry

## 2018-05-08 DIAGNOSIS — M79676 Pain in unspecified toe(s): Secondary | ICD-10-CM

## 2018-05-08 DIAGNOSIS — B351 Tinea unguium: Secondary | ICD-10-CM | POA: Diagnosis not present

## 2018-05-08 DIAGNOSIS — E1142 Type 2 diabetes mellitus with diabetic polyneuropathy: Secondary | ICD-10-CM

## 2018-05-08 NOTE — Progress Notes (Signed)
Complaint:  Visit Type: Patient returns to my office for continued preventative foot care services. Complaint: Patient states" my nails have grown long and thick and become painful to walk and wear shoes" Patient has been diagnosed with DM with neuropathy. The patient presents for preventative foot care services. No changes to ROS  Podiatric Exam: Vascular: dorsalis pedis and posterior tibial pulses are palpable bilateral. Capillary return is immediate. Temperature gradient is WNL. Skin turgor WNL  Sensorium: Diminished  Semmes Weinstein monofilament test. Normal tactile sensation bilaterally. Nail Exam: Pt has thick disfigured discolored nails with subungual debris noted bilateral entire nail hallux through fifth toenails Ulcer Exam: There is no evidence of ulcer or pre-ulcerative changes or infection. Orthopedic Exam: Muscle tone and strength are WNL. No limitations in general ROM. No crepitus or effusions noted. Hammer second B/L.  HAV  Left. Skin: No Porokeratosis. No infection or ulcers  Diagnosis:  Onychomycosis, , Pain in right toe, pain in left toes  Treatment & Plan Procedures and Treatment: Consent by patient was obtained for treatment procedures. The patient understood the discussion of treatment and procedures well. All questions were answered thoroughly reviewed. Debridement of mycotic and hypertrophic toenails, 1 through 5 bilateral and clearing of subungual debris. No ulceration, no infection noted.  Return Visit-Office Procedure: Patient instructed to return to the office for a follow up visit 3 months for continued evaluation and treatment.    Mili Piltz DPM 

## 2018-05-27 DIAGNOSIS — I639 Cerebral infarction, unspecified: Secondary | ICD-10-CM | POA: Diagnosis not present

## 2018-05-27 DIAGNOSIS — H548 Legal blindness, as defined in USA: Secondary | ICD-10-CM | POA: Diagnosis not present

## 2018-05-27 DIAGNOSIS — E119 Type 2 diabetes mellitus without complications: Secondary | ICD-10-CM | POA: Diagnosis not present

## 2018-05-29 DIAGNOSIS — I639 Cerebral infarction, unspecified: Secondary | ICD-10-CM | POA: Diagnosis not present

## 2018-05-29 DIAGNOSIS — H548 Legal blindness, as defined in USA: Secondary | ICD-10-CM | POA: Diagnosis not present

## 2018-05-29 DIAGNOSIS — E119 Type 2 diabetes mellitus without complications: Secondary | ICD-10-CM | POA: Diagnosis not present

## 2018-05-30 DIAGNOSIS — E119 Type 2 diabetes mellitus without complications: Secondary | ICD-10-CM | POA: Diagnosis not present

## 2018-05-30 DIAGNOSIS — I639 Cerebral infarction, unspecified: Secondary | ICD-10-CM | POA: Diagnosis not present

## 2018-05-30 DIAGNOSIS — H548 Legal blindness, as defined in USA: Secondary | ICD-10-CM | POA: Diagnosis not present

## 2018-05-31 DIAGNOSIS — I639 Cerebral infarction, unspecified: Secondary | ICD-10-CM | POA: Diagnosis not present

## 2018-05-31 DIAGNOSIS — H548 Legal blindness, as defined in USA: Secondary | ICD-10-CM | POA: Diagnosis not present

## 2018-05-31 DIAGNOSIS — E119 Type 2 diabetes mellitus without complications: Secondary | ICD-10-CM | POA: Diagnosis not present

## 2018-06-03 DIAGNOSIS — H548 Legal blindness, as defined in USA: Secondary | ICD-10-CM | POA: Diagnosis not present

## 2018-06-03 DIAGNOSIS — I639 Cerebral infarction, unspecified: Secondary | ICD-10-CM | POA: Diagnosis not present

## 2018-06-03 DIAGNOSIS — E119 Type 2 diabetes mellitus without complications: Secondary | ICD-10-CM | POA: Diagnosis not present

## 2018-06-05 DIAGNOSIS — E119 Type 2 diabetes mellitus without complications: Secondary | ICD-10-CM | POA: Diagnosis not present

## 2018-06-05 DIAGNOSIS — H548 Legal blindness, as defined in USA: Secondary | ICD-10-CM | POA: Diagnosis not present

## 2018-06-05 DIAGNOSIS — I639 Cerebral infarction, unspecified: Secondary | ICD-10-CM | POA: Diagnosis not present

## 2018-06-06 DIAGNOSIS — H548 Legal blindness, as defined in USA: Secondary | ICD-10-CM | POA: Diagnosis not present

## 2018-06-06 DIAGNOSIS — E119 Type 2 diabetes mellitus without complications: Secondary | ICD-10-CM | POA: Diagnosis not present

## 2018-06-06 DIAGNOSIS — I639 Cerebral infarction, unspecified: Secondary | ICD-10-CM | POA: Diagnosis not present

## 2018-06-07 ENCOUNTER — Telehealth: Payer: Self-pay | Admitting: Internal Medicine

## 2018-06-07 DIAGNOSIS — H401133 Primary open-angle glaucoma, bilateral, severe stage: Secondary | ICD-10-CM | POA: Diagnosis not present

## 2018-06-07 DIAGNOSIS — E119 Type 2 diabetes mellitus without complications: Secondary | ICD-10-CM | POA: Diagnosis not present

## 2018-06-07 DIAGNOSIS — H25812 Combined forms of age-related cataract, left eye: Secondary | ICD-10-CM | POA: Diagnosis not present

## 2018-06-07 LAB — HM DIABETES EYE EXAM

## 2018-06-10 DIAGNOSIS — H548 Legal blindness, as defined in USA: Secondary | ICD-10-CM | POA: Diagnosis not present

## 2018-06-10 DIAGNOSIS — E119 Type 2 diabetes mellitus without complications: Secondary | ICD-10-CM | POA: Diagnosis not present

## 2018-06-10 DIAGNOSIS — I639 Cerebral infarction, unspecified: Secondary | ICD-10-CM | POA: Diagnosis not present

## 2018-06-12 DIAGNOSIS — H548 Legal blindness, as defined in USA: Secondary | ICD-10-CM | POA: Diagnosis not present

## 2018-06-12 DIAGNOSIS — E119 Type 2 diabetes mellitus without complications: Secondary | ICD-10-CM | POA: Diagnosis not present

## 2018-06-12 DIAGNOSIS — I639 Cerebral infarction, unspecified: Secondary | ICD-10-CM | POA: Diagnosis not present

## 2018-06-13 DIAGNOSIS — I639 Cerebral infarction, unspecified: Secondary | ICD-10-CM | POA: Diagnosis not present

## 2018-06-13 DIAGNOSIS — H548 Legal blindness, as defined in USA: Secondary | ICD-10-CM | POA: Diagnosis not present

## 2018-06-13 DIAGNOSIS — E119 Type 2 diabetes mellitus without complications: Secondary | ICD-10-CM | POA: Diagnosis not present

## 2018-06-14 DIAGNOSIS — I639 Cerebral infarction, unspecified: Secondary | ICD-10-CM | POA: Diagnosis not present

## 2018-06-14 DIAGNOSIS — E119 Type 2 diabetes mellitus without complications: Secondary | ICD-10-CM | POA: Diagnosis not present

## 2018-06-14 DIAGNOSIS — H548 Legal blindness, as defined in USA: Secondary | ICD-10-CM | POA: Diagnosis not present

## 2018-06-17 ENCOUNTER — Encounter: Payer: Self-pay | Admitting: *Deleted

## 2018-06-17 DIAGNOSIS — E119 Type 2 diabetes mellitus without complications: Secondary | ICD-10-CM | POA: Diagnosis not present

## 2018-06-17 DIAGNOSIS — I639 Cerebral infarction, unspecified: Secondary | ICD-10-CM | POA: Diagnosis not present

## 2018-06-17 DIAGNOSIS — H548 Legal blindness, as defined in USA: Secondary | ICD-10-CM | POA: Diagnosis not present

## 2018-06-19 DIAGNOSIS — I639 Cerebral infarction, unspecified: Secondary | ICD-10-CM | POA: Diagnosis not present

## 2018-06-19 DIAGNOSIS — H548 Legal blindness, as defined in USA: Secondary | ICD-10-CM | POA: Diagnosis not present

## 2018-06-19 DIAGNOSIS — E119 Type 2 diabetes mellitus without complications: Secondary | ICD-10-CM | POA: Diagnosis not present

## 2018-06-20 DIAGNOSIS — E119 Type 2 diabetes mellitus without complications: Secondary | ICD-10-CM | POA: Diagnosis not present

## 2018-06-20 DIAGNOSIS — H548 Legal blindness, as defined in USA: Secondary | ICD-10-CM | POA: Diagnosis not present

## 2018-06-20 DIAGNOSIS — I639 Cerebral infarction, unspecified: Secondary | ICD-10-CM | POA: Diagnosis not present

## 2018-06-21 DIAGNOSIS — H548 Legal blindness, as defined in USA: Secondary | ICD-10-CM | POA: Diagnosis not present

## 2018-06-21 DIAGNOSIS — E119 Type 2 diabetes mellitus without complications: Secondary | ICD-10-CM | POA: Diagnosis not present

## 2018-06-21 DIAGNOSIS — I639 Cerebral infarction, unspecified: Secondary | ICD-10-CM | POA: Diagnosis not present

## 2018-06-24 DIAGNOSIS — I639 Cerebral infarction, unspecified: Secondary | ICD-10-CM | POA: Diagnosis not present

## 2018-06-24 DIAGNOSIS — E119 Type 2 diabetes mellitus without complications: Secondary | ICD-10-CM | POA: Diagnosis not present

## 2018-06-24 DIAGNOSIS — H548 Legal blindness, as defined in USA: Secondary | ICD-10-CM | POA: Diagnosis not present

## 2018-06-25 DIAGNOSIS — E119 Type 2 diabetes mellitus without complications: Secondary | ICD-10-CM | POA: Diagnosis not present

## 2018-06-25 DIAGNOSIS — I639 Cerebral infarction, unspecified: Secondary | ICD-10-CM | POA: Diagnosis not present

## 2018-06-25 DIAGNOSIS — H548 Legal blindness, as defined in USA: Secondary | ICD-10-CM | POA: Diagnosis not present

## 2018-06-26 DIAGNOSIS — E119 Type 2 diabetes mellitus without complications: Secondary | ICD-10-CM | POA: Diagnosis not present

## 2018-06-26 DIAGNOSIS — I639 Cerebral infarction, unspecified: Secondary | ICD-10-CM | POA: Diagnosis not present

## 2018-06-26 DIAGNOSIS — H548 Legal blindness, as defined in USA: Secondary | ICD-10-CM | POA: Diagnosis not present

## 2018-07-03 DIAGNOSIS — E119 Type 2 diabetes mellitus without complications: Secondary | ICD-10-CM | POA: Diagnosis not present

## 2018-07-03 DIAGNOSIS — H548 Legal blindness, as defined in USA: Secondary | ICD-10-CM | POA: Diagnosis not present

## 2018-07-03 DIAGNOSIS — I639 Cerebral infarction, unspecified: Secondary | ICD-10-CM | POA: Diagnosis not present

## 2018-07-04 DIAGNOSIS — I639 Cerebral infarction, unspecified: Secondary | ICD-10-CM | POA: Diagnosis not present

## 2018-07-04 DIAGNOSIS — H548 Legal blindness, as defined in USA: Secondary | ICD-10-CM | POA: Diagnosis not present

## 2018-07-04 DIAGNOSIS — E119 Type 2 diabetes mellitus without complications: Secondary | ICD-10-CM | POA: Diagnosis not present

## 2018-07-05 DIAGNOSIS — E119 Type 2 diabetes mellitus without complications: Secondary | ICD-10-CM | POA: Diagnosis not present

## 2018-07-05 DIAGNOSIS — I639 Cerebral infarction, unspecified: Secondary | ICD-10-CM | POA: Diagnosis not present

## 2018-07-05 DIAGNOSIS — H548 Legal blindness, as defined in USA: Secondary | ICD-10-CM | POA: Diagnosis not present

## 2018-07-08 DIAGNOSIS — H548 Legal blindness, as defined in USA: Secondary | ICD-10-CM | POA: Diagnosis not present

## 2018-07-08 DIAGNOSIS — I639 Cerebral infarction, unspecified: Secondary | ICD-10-CM | POA: Diagnosis not present

## 2018-07-08 DIAGNOSIS — E119 Type 2 diabetes mellitus without complications: Secondary | ICD-10-CM | POA: Diagnosis not present

## 2018-07-09 DIAGNOSIS — I639 Cerebral infarction, unspecified: Secondary | ICD-10-CM | POA: Diagnosis not present

## 2018-07-09 DIAGNOSIS — E119 Type 2 diabetes mellitus without complications: Secondary | ICD-10-CM | POA: Diagnosis not present

## 2018-07-09 DIAGNOSIS — H548 Legal blindness, as defined in USA: Secondary | ICD-10-CM | POA: Diagnosis not present

## 2018-07-10 DIAGNOSIS — E119 Type 2 diabetes mellitus without complications: Secondary | ICD-10-CM | POA: Diagnosis not present

## 2018-07-10 DIAGNOSIS — H548 Legal blindness, as defined in USA: Secondary | ICD-10-CM | POA: Diagnosis not present

## 2018-07-10 DIAGNOSIS — I639 Cerebral infarction, unspecified: Secondary | ICD-10-CM | POA: Diagnosis not present

## 2018-07-11 ENCOUNTER — Telehealth: Payer: Self-pay | Admitting: Podiatry

## 2018-07-11 DIAGNOSIS — E119 Type 2 diabetes mellitus without complications: Secondary | ICD-10-CM | POA: Diagnosis not present

## 2018-07-11 DIAGNOSIS — I639 Cerebral infarction, unspecified: Secondary | ICD-10-CM | POA: Diagnosis not present

## 2018-07-11 DIAGNOSIS — H548 Legal blindness, as defined in USA: Secondary | ICD-10-CM | POA: Diagnosis not present

## 2018-07-11 NOTE — Telephone Encounter (Signed)
Pt left message but it was hard to understand name checking on status of diabetic shoes.   I returned call and pt has medicaid only and we are still having a issue billing for that so as of now we are still not able to order shoes/inserts. Pt then asked the cost of shoes and I told him for 1 pair of shoes and inserts would be 250.00

## 2018-07-12 DIAGNOSIS — E119 Type 2 diabetes mellitus without complications: Secondary | ICD-10-CM | POA: Diagnosis not present

## 2018-07-12 DIAGNOSIS — I639 Cerebral infarction, unspecified: Secondary | ICD-10-CM | POA: Diagnosis not present

## 2018-07-12 DIAGNOSIS — H548 Legal blindness, as defined in USA: Secondary | ICD-10-CM | POA: Diagnosis not present

## 2018-07-15 DIAGNOSIS — E119 Type 2 diabetes mellitus without complications: Secondary | ICD-10-CM | POA: Diagnosis not present

## 2018-07-15 DIAGNOSIS — I639 Cerebral infarction, unspecified: Secondary | ICD-10-CM | POA: Diagnosis not present

## 2018-07-15 DIAGNOSIS — H548 Legal blindness, as defined in USA: Secondary | ICD-10-CM | POA: Diagnosis not present

## 2018-07-16 DIAGNOSIS — H548 Legal blindness, as defined in USA: Secondary | ICD-10-CM | POA: Diagnosis not present

## 2018-07-16 DIAGNOSIS — E119 Type 2 diabetes mellitus without complications: Secondary | ICD-10-CM | POA: Diagnosis not present

## 2018-07-16 DIAGNOSIS — I639 Cerebral infarction, unspecified: Secondary | ICD-10-CM | POA: Diagnosis not present

## 2018-07-17 ENCOUNTER — Other Ambulatory Visit: Payer: Self-pay

## 2018-07-17 ENCOUNTER — Encounter: Payer: Medicaid Other | Admitting: Internal Medicine

## 2018-07-17 DIAGNOSIS — H548 Legal blindness, as defined in USA: Secondary | ICD-10-CM | POA: Diagnosis not present

## 2018-07-17 DIAGNOSIS — I639 Cerebral infarction, unspecified: Secondary | ICD-10-CM | POA: Diagnosis not present

## 2018-07-17 DIAGNOSIS — E119 Type 2 diabetes mellitus without complications: Secondary | ICD-10-CM | POA: Diagnosis not present

## 2018-07-18 DIAGNOSIS — H548 Legal blindness, as defined in USA: Secondary | ICD-10-CM | POA: Diagnosis not present

## 2018-07-18 DIAGNOSIS — I639 Cerebral infarction, unspecified: Secondary | ICD-10-CM | POA: Diagnosis not present

## 2018-07-18 DIAGNOSIS — E119 Type 2 diabetes mellitus without complications: Secondary | ICD-10-CM | POA: Diagnosis not present

## 2018-07-19 DIAGNOSIS — E119 Type 2 diabetes mellitus without complications: Secondary | ICD-10-CM | POA: Diagnosis not present

## 2018-07-19 DIAGNOSIS — I639 Cerebral infarction, unspecified: Secondary | ICD-10-CM | POA: Diagnosis not present

## 2018-07-19 DIAGNOSIS — H548 Legal blindness, as defined in USA: Secondary | ICD-10-CM | POA: Diagnosis not present

## 2018-07-22 DIAGNOSIS — H548 Legal blindness, as defined in USA: Secondary | ICD-10-CM | POA: Diagnosis not present

## 2018-07-22 DIAGNOSIS — E119 Type 2 diabetes mellitus without complications: Secondary | ICD-10-CM | POA: Diagnosis not present

## 2018-07-22 DIAGNOSIS — I639 Cerebral infarction, unspecified: Secondary | ICD-10-CM | POA: Diagnosis not present

## 2018-07-23 DIAGNOSIS — H548 Legal blindness, as defined in USA: Secondary | ICD-10-CM | POA: Diagnosis not present

## 2018-07-23 DIAGNOSIS — I639 Cerebral infarction, unspecified: Secondary | ICD-10-CM | POA: Diagnosis not present

## 2018-07-23 DIAGNOSIS — E119 Type 2 diabetes mellitus without complications: Secondary | ICD-10-CM | POA: Diagnosis not present

## 2018-07-24 DIAGNOSIS — I639 Cerebral infarction, unspecified: Secondary | ICD-10-CM | POA: Diagnosis not present

## 2018-07-24 DIAGNOSIS — E119 Type 2 diabetes mellitus without complications: Secondary | ICD-10-CM | POA: Diagnosis not present

## 2018-07-24 DIAGNOSIS — H548 Legal blindness, as defined in USA: Secondary | ICD-10-CM | POA: Diagnosis not present

## 2018-07-25 NOTE — Progress Notes (Signed)
This encounter was created in error - please disregard.

## 2018-07-26 DIAGNOSIS — I639 Cerebral infarction, unspecified: Secondary | ICD-10-CM | POA: Diagnosis not present

## 2018-07-26 DIAGNOSIS — E119 Type 2 diabetes mellitus without complications: Secondary | ICD-10-CM | POA: Diagnosis not present

## 2018-07-26 DIAGNOSIS — H548 Legal blindness, as defined in USA: Secondary | ICD-10-CM | POA: Diagnosis not present

## 2018-07-29 DIAGNOSIS — H548 Legal blindness, as defined in USA: Secondary | ICD-10-CM | POA: Diagnosis not present

## 2018-07-29 DIAGNOSIS — E119 Type 2 diabetes mellitus without complications: Secondary | ICD-10-CM | POA: Diagnosis not present

## 2018-07-29 DIAGNOSIS — I639 Cerebral infarction, unspecified: Secondary | ICD-10-CM | POA: Diagnosis not present

## 2018-07-30 DIAGNOSIS — E119 Type 2 diabetes mellitus without complications: Secondary | ICD-10-CM | POA: Diagnosis not present

## 2018-07-30 DIAGNOSIS — I639 Cerebral infarction, unspecified: Secondary | ICD-10-CM | POA: Diagnosis not present

## 2018-07-30 DIAGNOSIS — H548 Legal blindness, as defined in USA: Secondary | ICD-10-CM | POA: Diagnosis not present

## 2018-07-31 DIAGNOSIS — H548 Legal blindness, as defined in USA: Secondary | ICD-10-CM | POA: Diagnosis not present

## 2018-07-31 DIAGNOSIS — E119 Type 2 diabetes mellitus without complications: Secondary | ICD-10-CM | POA: Diagnosis not present

## 2018-07-31 DIAGNOSIS — I639 Cerebral infarction, unspecified: Secondary | ICD-10-CM | POA: Diagnosis not present

## 2018-08-01 DIAGNOSIS — E119 Type 2 diabetes mellitus without complications: Secondary | ICD-10-CM | POA: Diagnosis not present

## 2018-08-01 DIAGNOSIS — H548 Legal blindness, as defined in USA: Secondary | ICD-10-CM | POA: Diagnosis not present

## 2018-08-01 DIAGNOSIS — I639 Cerebral infarction, unspecified: Secondary | ICD-10-CM | POA: Diagnosis not present

## 2018-08-02 DIAGNOSIS — H548 Legal blindness, as defined in USA: Secondary | ICD-10-CM | POA: Diagnosis not present

## 2018-08-02 DIAGNOSIS — I639 Cerebral infarction, unspecified: Secondary | ICD-10-CM | POA: Diagnosis not present

## 2018-08-02 DIAGNOSIS — E119 Type 2 diabetes mellitus without complications: Secondary | ICD-10-CM | POA: Diagnosis not present

## 2018-08-05 DIAGNOSIS — I639 Cerebral infarction, unspecified: Secondary | ICD-10-CM | POA: Diagnosis not present

## 2018-08-05 DIAGNOSIS — E119 Type 2 diabetes mellitus without complications: Secondary | ICD-10-CM | POA: Diagnosis not present

## 2018-08-05 DIAGNOSIS — H548 Legal blindness, as defined in USA: Secondary | ICD-10-CM | POA: Diagnosis not present

## 2018-08-06 DIAGNOSIS — I639 Cerebral infarction, unspecified: Secondary | ICD-10-CM | POA: Diagnosis not present

## 2018-08-06 DIAGNOSIS — H548 Legal blindness, as defined in USA: Secondary | ICD-10-CM | POA: Diagnosis not present

## 2018-08-06 DIAGNOSIS — E119 Type 2 diabetes mellitus without complications: Secondary | ICD-10-CM | POA: Diagnosis not present

## 2018-08-07 ENCOUNTER — Ambulatory Visit: Payer: Medicaid Other | Admitting: Podiatry

## 2018-08-07 DIAGNOSIS — H548 Legal blindness, as defined in USA: Secondary | ICD-10-CM | POA: Diagnosis not present

## 2018-08-07 DIAGNOSIS — E119 Type 2 diabetes mellitus without complications: Secondary | ICD-10-CM | POA: Diagnosis not present

## 2018-08-07 DIAGNOSIS — I639 Cerebral infarction, unspecified: Secondary | ICD-10-CM | POA: Diagnosis not present

## 2018-08-08 DIAGNOSIS — E119 Type 2 diabetes mellitus without complications: Secondary | ICD-10-CM | POA: Diagnosis not present

## 2018-08-08 DIAGNOSIS — I639 Cerebral infarction, unspecified: Secondary | ICD-10-CM | POA: Diagnosis not present

## 2018-08-08 DIAGNOSIS — H548 Legal blindness, as defined in USA: Secondary | ICD-10-CM | POA: Diagnosis not present

## 2018-08-09 DIAGNOSIS — I639 Cerebral infarction, unspecified: Secondary | ICD-10-CM | POA: Diagnosis not present

## 2018-08-09 DIAGNOSIS — E119 Type 2 diabetes mellitus without complications: Secondary | ICD-10-CM | POA: Diagnosis not present

## 2018-08-09 DIAGNOSIS — H548 Legal blindness, as defined in USA: Secondary | ICD-10-CM | POA: Diagnosis not present

## 2018-08-12 DIAGNOSIS — E119 Type 2 diabetes mellitus without complications: Secondary | ICD-10-CM | POA: Diagnosis not present

## 2018-08-12 DIAGNOSIS — H548 Legal blindness, as defined in USA: Secondary | ICD-10-CM | POA: Diagnosis not present

## 2018-08-12 DIAGNOSIS — I639 Cerebral infarction, unspecified: Secondary | ICD-10-CM | POA: Diagnosis not present

## 2018-08-13 DIAGNOSIS — I639 Cerebral infarction, unspecified: Secondary | ICD-10-CM | POA: Diagnosis not present

## 2018-08-13 DIAGNOSIS — E119 Type 2 diabetes mellitus without complications: Secondary | ICD-10-CM | POA: Diagnosis not present

## 2018-08-13 DIAGNOSIS — H548 Legal blindness, as defined in USA: Secondary | ICD-10-CM | POA: Diagnosis not present

## 2018-08-14 DIAGNOSIS — H548 Legal blindness, as defined in USA: Secondary | ICD-10-CM | POA: Diagnosis not present

## 2018-08-14 DIAGNOSIS — E119 Type 2 diabetes mellitus without complications: Secondary | ICD-10-CM | POA: Diagnosis not present

## 2018-08-14 DIAGNOSIS — I639 Cerebral infarction, unspecified: Secondary | ICD-10-CM | POA: Diagnosis not present

## 2018-08-15 DIAGNOSIS — I639 Cerebral infarction, unspecified: Secondary | ICD-10-CM | POA: Diagnosis not present

## 2018-08-15 DIAGNOSIS — E119 Type 2 diabetes mellitus without complications: Secondary | ICD-10-CM | POA: Diagnosis not present

## 2018-08-15 DIAGNOSIS — H548 Legal blindness, as defined in USA: Secondary | ICD-10-CM | POA: Diagnosis not present

## 2018-08-16 DIAGNOSIS — E119 Type 2 diabetes mellitus without complications: Secondary | ICD-10-CM | POA: Diagnosis not present

## 2018-08-16 DIAGNOSIS — I639 Cerebral infarction, unspecified: Secondary | ICD-10-CM | POA: Diagnosis not present

## 2018-08-16 DIAGNOSIS — H548 Legal blindness, as defined in USA: Secondary | ICD-10-CM | POA: Diagnosis not present

## 2018-08-19 DIAGNOSIS — E119 Type 2 diabetes mellitus without complications: Secondary | ICD-10-CM | POA: Diagnosis not present

## 2018-08-19 DIAGNOSIS — H548 Legal blindness, as defined in USA: Secondary | ICD-10-CM | POA: Diagnosis not present

## 2018-08-19 DIAGNOSIS — I639 Cerebral infarction, unspecified: Secondary | ICD-10-CM | POA: Diagnosis not present

## 2018-08-20 ENCOUNTER — Encounter: Payer: Self-pay | Admitting: Podiatry

## 2018-08-20 ENCOUNTER — Ambulatory Visit: Payer: Medicaid Other | Admitting: Podiatry

## 2018-08-20 DIAGNOSIS — B351 Tinea unguium: Secondary | ICD-10-CM | POA: Diagnosis not present

## 2018-08-20 DIAGNOSIS — M2010 Hallux valgus (acquired), unspecified foot: Secondary | ICD-10-CM

## 2018-08-20 DIAGNOSIS — M79676 Pain in unspecified toe(s): Secondary | ICD-10-CM

## 2018-08-20 DIAGNOSIS — E1142 Type 2 diabetes mellitus with diabetic polyneuropathy: Secondary | ICD-10-CM

## 2018-08-20 DIAGNOSIS — E119 Type 2 diabetes mellitus without complications: Secondary | ICD-10-CM | POA: Diagnosis not present

## 2018-08-20 DIAGNOSIS — H548 Legal blindness, as defined in USA: Secondary | ICD-10-CM | POA: Diagnosis not present

## 2018-08-20 DIAGNOSIS — I639 Cerebral infarction, unspecified: Secondary | ICD-10-CM | POA: Diagnosis not present

## 2018-08-20 NOTE — Progress Notes (Signed)
Complaint:  Visit Type: Patient returns to my office for continued preventative foot care services. Complaint: Patient states" my nails have grown long and thick and become painful to walk and wear shoes" Patient has been diagnosed with DM with neuropathy. The patient presents for preventative foot care services. No changes to ROS  Podiatric Exam: Vascular: dorsalis pedis and posterior tibial pulses are palpable bilateral. Capillary return is immediate. Temperature gradient is WNL. Skin turgor WNL  Sensorium: Diminished  Semmes Weinstein monofilament test. Normal tactile sensation bilaterally. Nail Exam: Pt has thick disfigured discolored nails with subungual debris noted bilateral entire nail hallux through fifth toenails Ulcer Exam: There is no evidence of ulcer or pre-ulcerative changes or infection. Orthopedic Exam: Muscle tone and strength are WNL. No limitations in general ROM. No crepitus or effusions noted. Hammer second B/L.  HAV  Left. Skin: No Porokeratosis. No infection or ulcers  Diagnosis:  Onychomycosis, , Pain in right toe, pain in left toes  Treatment & Plan Procedures and Treatment: Consent by patient was obtained for treatment procedures. The patient understood the discussion of treatment and procedures well. All questions were answered thoroughly reviewed. Debridement of mycotic and hypertrophic toenails, 1 through 5 bilateral and clearing of subungual debris. No ulceration, no infection noted.  Return Visit-Office Procedure: Patient instructed to return to the office for a follow up visit 3 months for continued evaluation and treatment.    Dianelly Ferran DPM 

## 2018-08-21 DIAGNOSIS — H548 Legal blindness, as defined in USA: Secondary | ICD-10-CM | POA: Diagnosis not present

## 2018-08-21 DIAGNOSIS — E119 Type 2 diabetes mellitus without complications: Secondary | ICD-10-CM | POA: Diagnosis not present

## 2018-08-21 DIAGNOSIS — I639 Cerebral infarction, unspecified: Secondary | ICD-10-CM | POA: Diagnosis not present

## 2018-08-22 DIAGNOSIS — I639 Cerebral infarction, unspecified: Secondary | ICD-10-CM | POA: Diagnosis not present

## 2018-08-22 DIAGNOSIS — H548 Legal blindness, as defined in USA: Secondary | ICD-10-CM | POA: Diagnosis not present

## 2018-08-22 DIAGNOSIS — E119 Type 2 diabetes mellitus without complications: Secondary | ICD-10-CM | POA: Diagnosis not present

## 2018-08-23 DIAGNOSIS — I639 Cerebral infarction, unspecified: Secondary | ICD-10-CM | POA: Diagnosis not present

## 2018-08-23 DIAGNOSIS — E119 Type 2 diabetes mellitus without complications: Secondary | ICD-10-CM | POA: Diagnosis not present

## 2018-08-23 DIAGNOSIS — H548 Legal blindness, as defined in USA: Secondary | ICD-10-CM | POA: Diagnosis not present

## 2018-08-26 DIAGNOSIS — I639 Cerebral infarction, unspecified: Secondary | ICD-10-CM | POA: Diagnosis not present

## 2018-08-26 DIAGNOSIS — H548 Legal blindness, as defined in USA: Secondary | ICD-10-CM | POA: Diagnosis not present

## 2018-08-26 DIAGNOSIS — E119 Type 2 diabetes mellitus without complications: Secondary | ICD-10-CM | POA: Diagnosis not present

## 2018-08-27 DIAGNOSIS — I639 Cerebral infarction, unspecified: Secondary | ICD-10-CM | POA: Diagnosis not present

## 2018-08-27 DIAGNOSIS — E119 Type 2 diabetes mellitus without complications: Secondary | ICD-10-CM | POA: Diagnosis not present

## 2018-08-27 DIAGNOSIS — H548 Legal blindness, as defined in USA: Secondary | ICD-10-CM | POA: Diagnosis not present

## 2018-08-28 DIAGNOSIS — I639 Cerebral infarction, unspecified: Secondary | ICD-10-CM | POA: Diagnosis not present

## 2018-08-28 DIAGNOSIS — E119 Type 2 diabetes mellitus without complications: Secondary | ICD-10-CM | POA: Diagnosis not present

## 2018-08-28 DIAGNOSIS — H548 Legal blindness, as defined in USA: Secondary | ICD-10-CM | POA: Diagnosis not present

## 2018-08-29 DIAGNOSIS — H548 Legal blindness, as defined in USA: Secondary | ICD-10-CM | POA: Diagnosis not present

## 2018-08-29 DIAGNOSIS — I639 Cerebral infarction, unspecified: Secondary | ICD-10-CM | POA: Diagnosis not present

## 2018-08-29 DIAGNOSIS — E119 Type 2 diabetes mellitus without complications: Secondary | ICD-10-CM | POA: Diagnosis not present

## 2018-08-30 DIAGNOSIS — E119 Type 2 diabetes mellitus without complications: Secondary | ICD-10-CM | POA: Diagnosis not present

## 2018-08-30 DIAGNOSIS — H548 Legal blindness, as defined in USA: Secondary | ICD-10-CM | POA: Diagnosis not present

## 2018-08-30 DIAGNOSIS — I639 Cerebral infarction, unspecified: Secondary | ICD-10-CM | POA: Diagnosis not present

## 2018-09-02 DIAGNOSIS — I639 Cerebral infarction, unspecified: Secondary | ICD-10-CM | POA: Diagnosis not present

## 2018-09-02 DIAGNOSIS — H548 Legal blindness, as defined in USA: Secondary | ICD-10-CM | POA: Diagnosis not present

## 2018-09-02 DIAGNOSIS — E119 Type 2 diabetes mellitus without complications: Secondary | ICD-10-CM | POA: Diagnosis not present

## 2018-09-03 DIAGNOSIS — E119 Type 2 diabetes mellitus without complications: Secondary | ICD-10-CM | POA: Diagnosis not present

## 2018-09-03 DIAGNOSIS — H548 Legal blindness, as defined in USA: Secondary | ICD-10-CM | POA: Diagnosis not present

## 2018-09-03 DIAGNOSIS — I639 Cerebral infarction, unspecified: Secondary | ICD-10-CM | POA: Diagnosis not present

## 2018-09-04 DIAGNOSIS — I639 Cerebral infarction, unspecified: Secondary | ICD-10-CM | POA: Diagnosis not present

## 2018-09-04 DIAGNOSIS — H548 Legal blindness, as defined in USA: Secondary | ICD-10-CM | POA: Diagnosis not present

## 2018-09-04 DIAGNOSIS — E119 Type 2 diabetes mellitus without complications: Secondary | ICD-10-CM | POA: Diagnosis not present

## 2018-09-05 DIAGNOSIS — E119 Type 2 diabetes mellitus without complications: Secondary | ICD-10-CM | POA: Diagnosis not present

## 2018-09-05 DIAGNOSIS — H548 Legal blindness, as defined in USA: Secondary | ICD-10-CM | POA: Diagnosis not present

## 2018-09-05 DIAGNOSIS — I639 Cerebral infarction, unspecified: Secondary | ICD-10-CM | POA: Diagnosis not present

## 2018-09-06 DIAGNOSIS — I639 Cerebral infarction, unspecified: Secondary | ICD-10-CM | POA: Diagnosis not present

## 2018-09-06 DIAGNOSIS — H548 Legal blindness, as defined in USA: Secondary | ICD-10-CM | POA: Diagnosis not present

## 2018-09-06 DIAGNOSIS — E119 Type 2 diabetes mellitus without complications: Secondary | ICD-10-CM | POA: Diagnosis not present

## 2018-09-09 DIAGNOSIS — I639 Cerebral infarction, unspecified: Secondary | ICD-10-CM | POA: Diagnosis not present

## 2018-09-09 DIAGNOSIS — H548 Legal blindness, as defined in USA: Secondary | ICD-10-CM | POA: Diagnosis not present

## 2018-09-09 DIAGNOSIS — E119 Type 2 diabetes mellitus without complications: Secondary | ICD-10-CM | POA: Diagnosis not present

## 2018-09-10 DIAGNOSIS — E119 Type 2 diabetes mellitus without complications: Secondary | ICD-10-CM | POA: Diagnosis not present

## 2018-09-10 DIAGNOSIS — I639 Cerebral infarction, unspecified: Secondary | ICD-10-CM | POA: Diagnosis not present

## 2018-09-10 DIAGNOSIS — H548 Legal blindness, as defined in USA: Secondary | ICD-10-CM | POA: Diagnosis not present

## 2018-09-11 DIAGNOSIS — E119 Type 2 diabetes mellitus without complications: Secondary | ICD-10-CM | POA: Diagnosis not present

## 2018-09-11 DIAGNOSIS — H548 Legal blindness, as defined in USA: Secondary | ICD-10-CM | POA: Diagnosis not present

## 2018-09-11 DIAGNOSIS — I639 Cerebral infarction, unspecified: Secondary | ICD-10-CM | POA: Diagnosis not present

## 2018-09-16 ENCOUNTER — Encounter: Payer: Medicaid Other | Admitting: Internal Medicine

## 2018-09-16 ENCOUNTER — Encounter: Payer: Self-pay | Admitting: Internal Medicine

## 2018-09-16 DIAGNOSIS — E119 Type 2 diabetes mellitus without complications: Secondary | ICD-10-CM | POA: Diagnosis not present

## 2018-09-16 DIAGNOSIS — I639 Cerebral infarction, unspecified: Secondary | ICD-10-CM | POA: Diagnosis not present

## 2018-09-16 DIAGNOSIS — H548 Legal blindness, as defined in USA: Secondary | ICD-10-CM | POA: Diagnosis not present

## 2018-09-17 DIAGNOSIS — I639 Cerebral infarction, unspecified: Secondary | ICD-10-CM | POA: Diagnosis not present

## 2018-09-17 DIAGNOSIS — E119 Type 2 diabetes mellitus without complications: Secondary | ICD-10-CM | POA: Diagnosis not present

## 2018-09-17 DIAGNOSIS — H548 Legal blindness, as defined in USA: Secondary | ICD-10-CM | POA: Diagnosis not present

## 2018-09-18 DIAGNOSIS — I639 Cerebral infarction, unspecified: Secondary | ICD-10-CM | POA: Diagnosis not present

## 2018-09-18 DIAGNOSIS — H548 Legal blindness, as defined in USA: Secondary | ICD-10-CM | POA: Diagnosis not present

## 2018-09-18 DIAGNOSIS — E119 Type 2 diabetes mellitus without complications: Secondary | ICD-10-CM | POA: Diagnosis not present

## 2018-09-19 DIAGNOSIS — I639 Cerebral infarction, unspecified: Secondary | ICD-10-CM | POA: Diagnosis not present

## 2018-09-19 DIAGNOSIS — H548 Legal blindness, as defined in USA: Secondary | ICD-10-CM | POA: Diagnosis not present

## 2018-09-19 DIAGNOSIS — E119 Type 2 diabetes mellitus without complications: Secondary | ICD-10-CM | POA: Diagnosis not present

## 2018-09-20 ENCOUNTER — Ambulatory Visit: Payer: Medicaid Other | Admitting: Podiatry

## 2018-09-23 DIAGNOSIS — I639 Cerebral infarction, unspecified: Secondary | ICD-10-CM | POA: Diagnosis not present

## 2018-09-23 DIAGNOSIS — E119 Type 2 diabetes mellitus without complications: Secondary | ICD-10-CM | POA: Diagnosis not present

## 2018-09-23 DIAGNOSIS — H548 Legal blindness, as defined in USA: Secondary | ICD-10-CM | POA: Diagnosis not present

## 2018-09-24 DIAGNOSIS — H548 Legal blindness, as defined in USA: Secondary | ICD-10-CM | POA: Diagnosis not present

## 2018-09-24 DIAGNOSIS — E119 Type 2 diabetes mellitus without complications: Secondary | ICD-10-CM | POA: Diagnosis not present

## 2018-09-24 DIAGNOSIS — I639 Cerebral infarction, unspecified: Secondary | ICD-10-CM | POA: Diagnosis not present

## 2018-09-25 DIAGNOSIS — I639 Cerebral infarction, unspecified: Secondary | ICD-10-CM | POA: Diagnosis not present

## 2018-09-25 DIAGNOSIS — E119 Type 2 diabetes mellitus without complications: Secondary | ICD-10-CM | POA: Diagnosis not present

## 2018-09-25 DIAGNOSIS — H548 Legal blindness, as defined in USA: Secondary | ICD-10-CM | POA: Diagnosis not present

## 2018-09-26 DIAGNOSIS — H548 Legal blindness, as defined in USA: Secondary | ICD-10-CM | POA: Diagnosis not present

## 2018-09-26 DIAGNOSIS — I639 Cerebral infarction, unspecified: Secondary | ICD-10-CM | POA: Diagnosis not present

## 2018-09-26 DIAGNOSIS — E119 Type 2 diabetes mellitus without complications: Secondary | ICD-10-CM | POA: Diagnosis not present

## 2018-09-27 DIAGNOSIS — E119 Type 2 diabetes mellitus without complications: Secondary | ICD-10-CM | POA: Diagnosis not present

## 2018-09-27 DIAGNOSIS — H548 Legal blindness, as defined in USA: Secondary | ICD-10-CM | POA: Diagnosis not present

## 2018-09-27 DIAGNOSIS — I639 Cerebral infarction, unspecified: Secondary | ICD-10-CM | POA: Diagnosis not present

## 2018-10-01 DIAGNOSIS — E119 Type 2 diabetes mellitus without complications: Secondary | ICD-10-CM | POA: Diagnosis not present

## 2018-10-01 DIAGNOSIS — I639 Cerebral infarction, unspecified: Secondary | ICD-10-CM | POA: Diagnosis not present

## 2018-10-01 DIAGNOSIS — H548 Legal blindness, as defined in USA: Secondary | ICD-10-CM | POA: Diagnosis not present

## 2018-10-08 DIAGNOSIS — E119 Type 2 diabetes mellitus without complications: Secondary | ICD-10-CM | POA: Diagnosis not present

## 2018-10-08 DIAGNOSIS — H401133 Primary open-angle glaucoma, bilateral, severe stage: Secondary | ICD-10-CM | POA: Diagnosis not present

## 2018-10-08 DIAGNOSIS — H25812 Combined forms of age-related cataract, left eye: Secondary | ICD-10-CM | POA: Diagnosis not present

## 2018-10-15 DIAGNOSIS — I639 Cerebral infarction, unspecified: Secondary | ICD-10-CM | POA: Diagnosis not present

## 2018-10-15 DIAGNOSIS — H548 Legal blindness, as defined in USA: Secondary | ICD-10-CM | POA: Diagnosis not present

## 2018-10-15 DIAGNOSIS — E119 Type 2 diabetes mellitus without complications: Secondary | ICD-10-CM | POA: Diagnosis not present

## 2018-10-16 DIAGNOSIS — H548 Legal blindness, as defined in USA: Secondary | ICD-10-CM | POA: Diagnosis not present

## 2018-10-16 DIAGNOSIS — I639 Cerebral infarction, unspecified: Secondary | ICD-10-CM | POA: Diagnosis not present

## 2018-10-16 DIAGNOSIS — E119 Type 2 diabetes mellitus without complications: Secondary | ICD-10-CM | POA: Diagnosis not present

## 2018-10-17 DIAGNOSIS — E119 Type 2 diabetes mellitus without complications: Secondary | ICD-10-CM | POA: Diagnosis not present

## 2018-10-17 DIAGNOSIS — I639 Cerebral infarction, unspecified: Secondary | ICD-10-CM | POA: Diagnosis not present

## 2018-10-17 DIAGNOSIS — H548 Legal blindness, as defined in USA: Secondary | ICD-10-CM | POA: Diagnosis not present

## 2018-10-18 DIAGNOSIS — I639 Cerebral infarction, unspecified: Secondary | ICD-10-CM | POA: Diagnosis not present

## 2018-10-18 DIAGNOSIS — H548 Legal blindness, as defined in USA: Secondary | ICD-10-CM | POA: Diagnosis not present

## 2018-10-18 DIAGNOSIS — E119 Type 2 diabetes mellitus without complications: Secondary | ICD-10-CM | POA: Diagnosis not present

## 2018-10-21 ENCOUNTER — Encounter: Payer: Medicaid Other | Admitting: Internal Medicine

## 2018-10-21 DIAGNOSIS — H548 Legal blindness, as defined in USA: Secondary | ICD-10-CM | POA: Diagnosis not present

## 2018-10-21 DIAGNOSIS — E119 Type 2 diabetes mellitus without complications: Secondary | ICD-10-CM | POA: Diagnosis not present

## 2018-10-21 DIAGNOSIS — I639 Cerebral infarction, unspecified: Secondary | ICD-10-CM | POA: Diagnosis not present

## 2018-10-22 DIAGNOSIS — I639 Cerebral infarction, unspecified: Secondary | ICD-10-CM | POA: Diagnosis not present

## 2018-10-22 DIAGNOSIS — H548 Legal blindness, as defined in USA: Secondary | ICD-10-CM | POA: Diagnosis not present

## 2018-10-22 DIAGNOSIS — E119 Type 2 diabetes mellitus without complications: Secondary | ICD-10-CM | POA: Diagnosis not present

## 2018-10-23 DIAGNOSIS — E119 Type 2 diabetes mellitus without complications: Secondary | ICD-10-CM | POA: Diagnosis not present

## 2018-10-23 DIAGNOSIS — I639 Cerebral infarction, unspecified: Secondary | ICD-10-CM | POA: Diagnosis not present

## 2018-10-23 DIAGNOSIS — H548 Legal blindness, as defined in USA: Secondary | ICD-10-CM | POA: Diagnosis not present

## 2018-10-24 DIAGNOSIS — E119 Type 2 diabetes mellitus without complications: Secondary | ICD-10-CM | POA: Diagnosis not present

## 2018-10-24 DIAGNOSIS — H548 Legal blindness, as defined in USA: Secondary | ICD-10-CM | POA: Diagnosis not present

## 2018-10-24 DIAGNOSIS — I639 Cerebral infarction, unspecified: Secondary | ICD-10-CM | POA: Diagnosis not present

## 2018-10-25 DIAGNOSIS — H548 Legal blindness, as defined in USA: Secondary | ICD-10-CM | POA: Diagnosis not present

## 2018-10-25 DIAGNOSIS — I639 Cerebral infarction, unspecified: Secondary | ICD-10-CM | POA: Diagnosis not present

## 2018-10-25 DIAGNOSIS — E119 Type 2 diabetes mellitus without complications: Secondary | ICD-10-CM | POA: Diagnosis not present

## 2018-10-28 DIAGNOSIS — H548 Legal blindness, as defined in USA: Secondary | ICD-10-CM | POA: Diagnosis not present

## 2018-10-28 DIAGNOSIS — E119 Type 2 diabetes mellitus without complications: Secondary | ICD-10-CM | POA: Diagnosis not present

## 2018-10-28 DIAGNOSIS — I639 Cerebral infarction, unspecified: Secondary | ICD-10-CM | POA: Diagnosis not present

## 2018-10-29 DIAGNOSIS — H548 Legal blindness, as defined in USA: Secondary | ICD-10-CM | POA: Diagnosis not present

## 2018-10-29 DIAGNOSIS — E119 Type 2 diabetes mellitus without complications: Secondary | ICD-10-CM | POA: Diagnosis not present

## 2018-10-29 DIAGNOSIS — I639 Cerebral infarction, unspecified: Secondary | ICD-10-CM | POA: Diagnosis not present

## 2018-10-30 DIAGNOSIS — H548 Legal blindness, as defined in USA: Secondary | ICD-10-CM | POA: Diagnosis not present

## 2018-10-30 DIAGNOSIS — E119 Type 2 diabetes mellitus without complications: Secondary | ICD-10-CM | POA: Diagnosis not present

## 2018-10-30 DIAGNOSIS — I639 Cerebral infarction, unspecified: Secondary | ICD-10-CM | POA: Diagnosis not present

## 2018-10-31 DIAGNOSIS — I639 Cerebral infarction, unspecified: Secondary | ICD-10-CM | POA: Diagnosis not present

## 2018-10-31 DIAGNOSIS — H548 Legal blindness, as defined in USA: Secondary | ICD-10-CM | POA: Diagnosis not present

## 2018-10-31 DIAGNOSIS — E119 Type 2 diabetes mellitus without complications: Secondary | ICD-10-CM | POA: Diagnosis not present

## 2018-11-04 DIAGNOSIS — E119 Type 2 diabetes mellitus without complications: Secondary | ICD-10-CM | POA: Diagnosis not present

## 2018-11-04 DIAGNOSIS — H548 Legal blindness, as defined in USA: Secondary | ICD-10-CM | POA: Diagnosis not present

## 2018-11-04 DIAGNOSIS — I639 Cerebral infarction, unspecified: Secondary | ICD-10-CM | POA: Diagnosis not present

## 2018-11-05 DIAGNOSIS — I639 Cerebral infarction, unspecified: Secondary | ICD-10-CM | POA: Diagnosis not present

## 2018-11-05 DIAGNOSIS — H548 Legal blindness, as defined in USA: Secondary | ICD-10-CM | POA: Diagnosis not present

## 2018-11-05 DIAGNOSIS — E119 Type 2 diabetes mellitus without complications: Secondary | ICD-10-CM | POA: Diagnosis not present

## 2018-11-06 DIAGNOSIS — I639 Cerebral infarction, unspecified: Secondary | ICD-10-CM | POA: Diagnosis not present

## 2018-11-06 DIAGNOSIS — E119 Type 2 diabetes mellitus without complications: Secondary | ICD-10-CM | POA: Diagnosis not present

## 2018-11-06 DIAGNOSIS — H548 Legal blindness, as defined in USA: Secondary | ICD-10-CM | POA: Diagnosis not present

## 2018-11-07 DIAGNOSIS — E119 Type 2 diabetes mellitus without complications: Secondary | ICD-10-CM | POA: Diagnosis not present

## 2018-11-07 DIAGNOSIS — H548 Legal blindness, as defined in USA: Secondary | ICD-10-CM | POA: Diagnosis not present

## 2018-11-07 DIAGNOSIS — I639 Cerebral infarction, unspecified: Secondary | ICD-10-CM | POA: Diagnosis not present

## 2018-11-08 DIAGNOSIS — H548 Legal blindness, as defined in USA: Secondary | ICD-10-CM | POA: Diagnosis not present

## 2018-11-08 DIAGNOSIS — I639 Cerebral infarction, unspecified: Secondary | ICD-10-CM | POA: Diagnosis not present

## 2018-11-08 DIAGNOSIS — E119 Type 2 diabetes mellitus without complications: Secondary | ICD-10-CM | POA: Diagnosis not present

## 2018-11-11 ENCOUNTER — Other Ambulatory Visit: Payer: Self-pay

## 2018-11-11 ENCOUNTER — Encounter: Payer: Self-pay | Admitting: Internal Medicine

## 2018-11-11 ENCOUNTER — Ambulatory Visit (INDEPENDENT_AMBULATORY_CARE_PROVIDER_SITE_OTHER): Payer: Medicaid Other | Admitting: Internal Medicine

## 2018-11-11 VITALS — BP 108/61 | HR 80 | Temp 98.4°F | Ht 68.0 in | Wt 211.9 lb

## 2018-11-11 DIAGNOSIS — E1142 Type 2 diabetes mellitus with diabetic polyneuropathy: Secondary | ICD-10-CM

## 2018-11-11 DIAGNOSIS — N269 Renal sclerosis, unspecified: Secondary | ICD-10-CM | POA: Diagnosis not present

## 2018-11-11 DIAGNOSIS — N529 Male erectile dysfunction, unspecified: Secondary | ICD-10-CM | POA: Diagnosis not present

## 2018-11-11 DIAGNOSIS — E1136 Type 2 diabetes mellitus with diabetic cataract: Secondary | ICD-10-CM | POA: Diagnosis not present

## 2018-11-11 DIAGNOSIS — I69320 Aphasia following cerebral infarction: Secondary | ICD-10-CM

## 2018-11-11 DIAGNOSIS — I10 Essential (primary) hypertension: Secondary | ICD-10-CM | POA: Diagnosis not present

## 2018-11-11 DIAGNOSIS — E119 Type 2 diabetes mellitus without complications: Secondary | ICD-10-CM | POA: Diagnosis not present

## 2018-11-11 DIAGNOSIS — E785 Hyperlipidemia, unspecified: Secondary | ICD-10-CM | POA: Diagnosis not present

## 2018-11-11 DIAGNOSIS — I63512 Cerebral infarction due to unspecified occlusion or stenosis of left middle cerebral artery: Secondary | ICD-10-CM

## 2018-11-11 DIAGNOSIS — H548 Legal blindness, as defined in USA: Secondary | ICD-10-CM | POA: Diagnosis not present

## 2018-11-11 DIAGNOSIS — Z79899 Other long term (current) drug therapy: Secondary | ICD-10-CM

## 2018-11-11 DIAGNOSIS — I639 Cerebral infarction, unspecified: Secondary | ICD-10-CM | POA: Diagnosis not present

## 2018-11-11 LAB — POCT GLYCOSYLATED HEMOGLOBIN (HGB A1C): Hemoglobin A1C: 5.6 % (ref 4.0–5.6)

## 2018-11-11 LAB — GLUCOSE, CAPILLARY: Glucose-Capillary: 110 mg/dL — ABNORMAL HIGH (ref 70–99)

## 2018-11-11 MED ORDER — SILDENAFIL CITRATE 20 MG PO TABS: 20.0000 mg | ORAL_TABLET | ORAL | 0 refills | Status: DC

## 2018-11-11 MED ORDER — LISINOPRIL 20 MG PO TABS: 20.0000 mg | ORAL_TABLET | Freq: Every day | ORAL | 1 refills | Status: DC

## 2018-11-11 MED ORDER — METFORMIN HCL 1000 MG PO TABS: 1000.0000 mg | ORAL_TABLET | Freq: Two times a day (BID) | ORAL | 3 refills | Status: DC

## 2018-11-11 NOTE — Progress Notes (Signed)
   CC: DM follow up  HPI:  Mr.Brady Church is a 57 y.o. male with past medical history outlined below here for DM follow up. For the details of today's visit, please refer to the assessment and plan.  Past Medical History:  Diagnosis Date  . 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 Diabetes mellitus   . High cholesterol   . Hypertension   . Substance abuse (HCC)    cocaine. Stopped in 2010  . Tobacco use disorder 10/07/2014    Review of Systems  Respiratory: Negative for shortness of breath.   Cardiovascular: Negative for chest pain.    Physical Exam:  Vitals:   11/11/18 1331  BP: 108/61  Pulse: 80  Temp: 98.4 F (36.9 C)  TempSrc: Oral  SpO2: 99%  Weight: 211 lb 14.4 oz (96.1 kg)  Height: 5\' 8"  (1.727 m)    Constitutional: NAD, appears comfortable.  Cardiovascular: RRR, no murmurs, rubs, or gallops.  Pulmonary/Chest: CTAB, no wheezes, rales, or rhonchi.  Extremities: Warm and well perfused. No edema.  Psychiatric: Normal mood and affect  Assessment & Plan:   See Encounters Tab for problem based charting.  Patient discussed with Dr. Josem Kaufmann

## 2018-11-11 NOTE — Patient Instructions (Signed)
Brady Church,  It was a pleasure to see you. Today we discussed the following:  For your diabetes, please stop your metformin. Continue with your positive dietary changes and follow up with me again in 3 months for an A1c recheck.  For your blood pressure, please cut your lisinopril in half. Take 20 mg daily instead of 40 mg. Continue your hydrochlorothiazide.   For your erectile dysfunction, you may take 2-5 tablets of sildenafil (20 mg tabs) 30 minutes prior to sexual activity. If you develop any side effects of lightheadedness or dizziness, please give [place] a call or present to the ER.   If you have any questions or concerns, call our clinic at [phone] or after hours call [phone] and ask for the internal medicine resident on call. Thank you!  Dr. Antony Contras

## 2018-11-11 NOTE — Assessment & Plan Note (Signed)
Patient is complaining of persistent erectile dysfunction that is likely secondary to vascular disease. 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 He has HLD on atorvastatin 80 mg daily. TSH checked 1 year ago WNL. He is otherwise asymptomatic. He is able to achieve an erection but cannot maintain it. He still has morning erections. He denies depression / anxiety. Medication list has been reviewed; does not appear he is on any medications that could contribute. His blood pressure today is 108/61. Plan is to start sildenafil prn but will cut back on his lisinopril from 40 mg to 20 mg daily. He has been counseled on potential side effect of hypotension. -- Decrease lisinopril 40 -> 20 mg daily. Continue HCTZ 25 mg daily -- Start sildenafil 20 mg tabs; take 2-5 tablets 30 minutes prior to sexual activity (#50) - spoke with pharmacy; $20 with [facility]

## 2018-11-11 NOTE — Progress Notes (Signed)
Case discussed with Dr. Guilloud at the time of the visit. We reviewed the resident's history and exam and pertinent patient test results. I agree with the assessment, diagnosis, and plan of care documented in the resident's note. 

## 2018-11-11 NOTE — Assessment & Plan Note (Signed)
Patient has a history of left MCA stroke and HLD with goal LDL < 100. Last LDL checked 2018 was 159. He is compliant with atorvastatin 80 mg daily. Plan was to repeat lipid panel today, unfortunately patient left prior to lab draw. Will repeat lipid panel at next visit. If still above goal, would consider addition of zetia or changing to crestor.

## 2018-11-11 NOTE — Assessment & Plan Note (Signed)
Patient has done an excellent job controlling his diabetes. Hemoglobin was > 14 on diagnosis, down to 5.6 today. He has been stable off insulin now for 1 year. He ran out of his metformin 2 weeks ago. Given his very low hemoglobin A1c we will discontinue altogether. Follow up 3 months for repeat hemoglobin A1c.

## 2018-11-11 NOTE — Assessment & Plan Note (Signed)
Well controlled, 108/61. He denies symptoms of hypotension. Currently taking lisinopril 40 mg and HCTZ 25 mg daily. Plan today is to start PRN sildenafil for erectile dysfunction. Will decrease lisinopril to 20 mg daily in hopes of avoiding hypotension. Patient has been counseled on that potential side effect. -- Continue HCTZ 25 mg daily -- Decrease lisinopril 40 -> 20 mg daily

## 2018-11-12 DIAGNOSIS — I639 Cerebral infarction, unspecified: Secondary | ICD-10-CM | POA: Diagnosis not present

## 2018-11-12 DIAGNOSIS — E119 Type 2 diabetes mellitus without complications: Secondary | ICD-10-CM | POA: Diagnosis not present

## 2018-11-12 DIAGNOSIS — H548 Legal blindness, as defined in USA: Secondary | ICD-10-CM | POA: Diagnosis not present

## 2018-11-13 ENCOUNTER — Encounter: Payer: Self-pay | Admitting: Podiatry

## 2018-11-13 ENCOUNTER — Ambulatory Visit: Payer: Medicaid Other | Admitting: Podiatry

## 2018-11-13 DIAGNOSIS — H548 Legal blindness, as defined in USA: Secondary | ICD-10-CM | POA: Diagnosis not present

## 2018-11-13 DIAGNOSIS — I639 Cerebral infarction, unspecified: Secondary | ICD-10-CM | POA: Diagnosis not present

## 2018-11-13 DIAGNOSIS — M2010 Hallux valgus (acquired), unspecified foot: Secondary | ICD-10-CM

## 2018-11-13 DIAGNOSIS — E119 Type 2 diabetes mellitus without complications: Secondary | ICD-10-CM | POA: Diagnosis not present

## 2018-11-13 DIAGNOSIS — B351 Tinea unguium: Secondary | ICD-10-CM | POA: Diagnosis not present

## 2018-11-13 DIAGNOSIS — M79676 Pain in unspecified toe(s): Secondary | ICD-10-CM | POA: Diagnosis not present

## 2018-11-13 DIAGNOSIS — E1142 Type 2 diabetes mellitus with diabetic polyneuropathy: Secondary | ICD-10-CM

## 2018-11-13 NOTE — Progress Notes (Signed)
Complaint:  Visit Type: Patient returns to my office for continued preventative foot care services. Complaint: Patient states" my nails have grown long and thick and become painful to walk and wear shoes" Patient has been diagnosed with DM with neuropathy. The patient presents for preventative foot care services. No changes to ROS  Podiatric Exam: Vascular: dorsalis pedis and posterior tibial pulses are palpable bilateral. Capillary return is immediate. Temperature gradient is WNL. Skin turgor WNL  Sensorium: Diminished  Semmes Weinstein monofilament test. Normal tactile sensation bilaterally. Nail Exam: Pt has thick disfigured discolored nails with subungual debris noted bilateral entire nail hallux through fifth toenails Ulcer Exam: There is no evidence of ulcer or pre-ulcerative changes or infection. Orthopedic Exam: Muscle tone and strength are WNL. No limitations in general ROM. No crepitus or effusions noted. Hammer second B/L.  HAV  B/L. Skin: No Porokeratosis. No infection or ulcers  Diagnosis:  Onychomycosis, , Pain in right toe, pain in left toes  Treatment & Plan Procedures and Treatment: Consent by patient was obtained for treatment procedures. The patient understood the discussion of treatment and procedures well. All questions were answered thoroughly reviewed. Debridement of mycotic and hypertrophic toenails, 1 through 5 bilateral and clearing of subungual debris. No ulceration, no infection noted.  Return Visit-Office Procedure: Patient instructed to return to the office for a follow up visit 3 months for continued evaluation and treatment.    Helane Gunther DPM

## 2018-11-14 DIAGNOSIS — E119 Type 2 diabetes mellitus without complications: Secondary | ICD-10-CM | POA: Diagnosis not present

## 2018-11-14 DIAGNOSIS — I639 Cerebral infarction, unspecified: Secondary | ICD-10-CM | POA: Diagnosis not present

## 2018-11-14 DIAGNOSIS — H548 Legal blindness, as defined in USA: Secondary | ICD-10-CM | POA: Diagnosis not present

## 2018-11-15 DIAGNOSIS — H548 Legal blindness, as defined in USA: Secondary | ICD-10-CM | POA: Diagnosis not present

## 2018-11-15 DIAGNOSIS — I639 Cerebral infarction, unspecified: Secondary | ICD-10-CM | POA: Diagnosis not present

## 2018-11-15 DIAGNOSIS — E119 Type 2 diabetes mellitus without complications: Secondary | ICD-10-CM | POA: Diagnosis not present

## 2018-11-18 DIAGNOSIS — I639 Cerebral infarction, unspecified: Secondary | ICD-10-CM | POA: Diagnosis not present

## 2018-11-18 DIAGNOSIS — E119 Type 2 diabetes mellitus without complications: Secondary | ICD-10-CM | POA: Diagnosis not present

## 2018-11-18 DIAGNOSIS — H548 Legal blindness, as defined in USA: Secondary | ICD-10-CM | POA: Diagnosis not present

## 2018-11-19 DIAGNOSIS — E119 Type 2 diabetes mellitus without complications: Secondary | ICD-10-CM | POA: Diagnosis not present

## 2018-11-19 DIAGNOSIS — H548 Legal blindness, as defined in USA: Secondary | ICD-10-CM | POA: Diagnosis not present

## 2018-11-19 DIAGNOSIS — I639 Cerebral infarction, unspecified: Secondary | ICD-10-CM | POA: Diagnosis not present

## 2018-11-20 DIAGNOSIS — H548 Legal blindness, as defined in USA: Secondary | ICD-10-CM | POA: Diagnosis not present

## 2018-11-20 DIAGNOSIS — E119 Type 2 diabetes mellitus without complications: Secondary | ICD-10-CM | POA: Diagnosis not present

## 2018-11-20 DIAGNOSIS — I639 Cerebral infarction, unspecified: Secondary | ICD-10-CM | POA: Diagnosis not present

## 2018-11-21 DIAGNOSIS — H548 Legal blindness, as defined in USA: Secondary | ICD-10-CM | POA: Diagnosis not present

## 2018-11-21 DIAGNOSIS — I639 Cerebral infarction, unspecified: Secondary | ICD-10-CM | POA: Diagnosis not present

## 2018-11-21 DIAGNOSIS — E119 Type 2 diabetes mellitus without complications: Secondary | ICD-10-CM | POA: Diagnosis not present

## 2018-11-22 DIAGNOSIS — I639 Cerebral infarction, unspecified: Secondary | ICD-10-CM | POA: Diagnosis not present

## 2018-11-22 DIAGNOSIS — H548 Legal blindness, as defined in USA: Secondary | ICD-10-CM | POA: Diagnosis not present

## 2018-11-22 DIAGNOSIS — E119 Type 2 diabetes mellitus without complications: Secondary | ICD-10-CM | POA: Diagnosis not present

## 2018-11-26 DIAGNOSIS — H548 Legal blindness, as defined in USA: Secondary | ICD-10-CM | POA: Diagnosis not present

## 2018-11-26 DIAGNOSIS — E119 Type 2 diabetes mellitus without complications: Secondary | ICD-10-CM | POA: Diagnosis not present

## 2018-11-26 DIAGNOSIS — I639 Cerebral infarction, unspecified: Secondary | ICD-10-CM | POA: Diagnosis not present

## 2018-11-27 DIAGNOSIS — E119 Type 2 diabetes mellitus without complications: Secondary | ICD-10-CM | POA: Diagnosis not present

## 2018-11-27 DIAGNOSIS — H548 Legal blindness, as defined in USA: Secondary | ICD-10-CM | POA: Diagnosis not present

## 2018-11-27 DIAGNOSIS — I639 Cerebral infarction, unspecified: Secondary | ICD-10-CM | POA: Diagnosis not present

## 2018-11-28 DIAGNOSIS — E119 Type 2 diabetes mellitus without complications: Secondary | ICD-10-CM | POA: Diagnosis not present

## 2018-11-28 DIAGNOSIS — I639 Cerebral infarction, unspecified: Secondary | ICD-10-CM | POA: Diagnosis not present

## 2018-11-28 DIAGNOSIS — H548 Legal blindness, as defined in USA: Secondary | ICD-10-CM | POA: Diagnosis not present

## 2018-11-29 DIAGNOSIS — H548 Legal blindness, as defined in USA: Secondary | ICD-10-CM | POA: Diagnosis not present

## 2018-11-29 DIAGNOSIS — E119 Type 2 diabetes mellitus without complications: Secondary | ICD-10-CM | POA: Diagnosis not present

## 2018-11-29 DIAGNOSIS — I639 Cerebral infarction, unspecified: Secondary | ICD-10-CM | POA: Diagnosis not present

## 2018-12-02 DIAGNOSIS — I639 Cerebral infarction, unspecified: Secondary | ICD-10-CM | POA: Diagnosis not present

## 2018-12-02 DIAGNOSIS — H548 Legal blindness, as defined in USA: Secondary | ICD-10-CM | POA: Diagnosis not present

## 2018-12-02 DIAGNOSIS — E119 Type 2 diabetes mellitus without complications: Secondary | ICD-10-CM | POA: Diagnosis not present

## 2018-12-03 DIAGNOSIS — I639 Cerebral infarction, unspecified: Secondary | ICD-10-CM | POA: Diagnosis not present

## 2018-12-03 DIAGNOSIS — E119 Type 2 diabetes mellitus without complications: Secondary | ICD-10-CM | POA: Diagnosis not present

## 2018-12-03 DIAGNOSIS — H548 Legal blindness, as defined in USA: Secondary | ICD-10-CM | POA: Diagnosis not present

## 2018-12-04 DIAGNOSIS — H548 Legal blindness, as defined in USA: Secondary | ICD-10-CM | POA: Diagnosis not present

## 2018-12-04 DIAGNOSIS — E119 Type 2 diabetes mellitus without complications: Secondary | ICD-10-CM | POA: Diagnosis not present

## 2018-12-04 DIAGNOSIS — I639 Cerebral infarction, unspecified: Secondary | ICD-10-CM | POA: Diagnosis not present

## 2018-12-05 DIAGNOSIS — H548 Legal blindness, as defined in USA: Secondary | ICD-10-CM | POA: Diagnosis not present

## 2018-12-05 DIAGNOSIS — E119 Type 2 diabetes mellitus without complications: Secondary | ICD-10-CM | POA: Diagnosis not present

## 2018-12-05 DIAGNOSIS — I639 Cerebral infarction, unspecified: Secondary | ICD-10-CM | POA: Diagnosis not present

## 2018-12-06 DIAGNOSIS — I639 Cerebral infarction, unspecified: Secondary | ICD-10-CM | POA: Diagnosis not present

## 2018-12-06 DIAGNOSIS — E119 Type 2 diabetes mellitus without complications: Secondary | ICD-10-CM | POA: Diagnosis not present

## 2018-12-06 DIAGNOSIS — H548 Legal blindness, as defined in USA: Secondary | ICD-10-CM | POA: Diagnosis not present

## 2018-12-09 DIAGNOSIS — I639 Cerebral infarction, unspecified: Secondary | ICD-10-CM | POA: Diagnosis not present

## 2018-12-09 DIAGNOSIS — E119 Type 2 diabetes mellitus without complications: Secondary | ICD-10-CM | POA: Diagnosis not present

## 2018-12-09 DIAGNOSIS — H548 Legal blindness, as defined in USA: Secondary | ICD-10-CM | POA: Diagnosis not present

## 2018-12-10 DIAGNOSIS — E119 Type 2 diabetes mellitus without complications: Secondary | ICD-10-CM | POA: Diagnosis not present

## 2018-12-10 DIAGNOSIS — H548 Legal blindness, as defined in USA: Secondary | ICD-10-CM | POA: Diagnosis not present

## 2018-12-10 DIAGNOSIS — I639 Cerebral infarction, unspecified: Secondary | ICD-10-CM | POA: Diagnosis not present

## 2018-12-11 DIAGNOSIS — H548 Legal blindness, as defined in USA: Secondary | ICD-10-CM | POA: Diagnosis not present

## 2018-12-11 DIAGNOSIS — I639 Cerebral infarction, unspecified: Secondary | ICD-10-CM | POA: Diagnosis not present

## 2018-12-11 DIAGNOSIS — E119 Type 2 diabetes mellitus without complications: Secondary | ICD-10-CM | POA: Diagnosis not present

## 2018-12-12 DIAGNOSIS — I639 Cerebral infarction, unspecified: Secondary | ICD-10-CM | POA: Diagnosis not present

## 2018-12-12 DIAGNOSIS — E119 Type 2 diabetes mellitus without complications: Secondary | ICD-10-CM | POA: Diagnosis not present

## 2018-12-12 DIAGNOSIS — H548 Legal blindness, as defined in USA: Secondary | ICD-10-CM | POA: Diagnosis not present

## 2018-12-13 DIAGNOSIS — E119 Type 2 diabetes mellitus without complications: Secondary | ICD-10-CM | POA: Diagnosis not present

## 2018-12-13 DIAGNOSIS — H548 Legal blindness, as defined in USA: Secondary | ICD-10-CM | POA: Diagnosis not present

## 2018-12-13 DIAGNOSIS — I639 Cerebral infarction, unspecified: Secondary | ICD-10-CM | POA: Diagnosis not present

## 2018-12-16 DIAGNOSIS — E119 Type 2 diabetes mellitus without complications: Secondary | ICD-10-CM | POA: Diagnosis not present

## 2018-12-16 DIAGNOSIS — I639 Cerebral infarction, unspecified: Secondary | ICD-10-CM | POA: Diagnosis not present

## 2018-12-16 DIAGNOSIS — H548 Legal blindness, as defined in USA: Secondary | ICD-10-CM | POA: Diagnosis not present

## 2018-12-17 DIAGNOSIS — E119 Type 2 diabetes mellitus without complications: Secondary | ICD-10-CM | POA: Diagnosis not present

## 2018-12-17 DIAGNOSIS — I639 Cerebral infarction, unspecified: Secondary | ICD-10-CM | POA: Diagnosis not present

## 2018-12-17 DIAGNOSIS — H548 Legal blindness, as defined in USA: Secondary | ICD-10-CM | POA: Diagnosis not present

## 2018-12-18 DIAGNOSIS — I639 Cerebral infarction, unspecified: Secondary | ICD-10-CM | POA: Diagnosis not present

## 2018-12-18 DIAGNOSIS — H548 Legal blindness, as defined in USA: Secondary | ICD-10-CM | POA: Diagnosis not present

## 2018-12-18 DIAGNOSIS — E119 Type 2 diabetes mellitus without complications: Secondary | ICD-10-CM | POA: Diagnosis not present

## 2018-12-19 DIAGNOSIS — H548 Legal blindness, as defined in USA: Secondary | ICD-10-CM | POA: Diagnosis not present

## 2018-12-19 DIAGNOSIS — I639 Cerebral infarction, unspecified: Secondary | ICD-10-CM | POA: Diagnosis not present

## 2018-12-19 DIAGNOSIS — E119 Type 2 diabetes mellitus without complications: Secondary | ICD-10-CM | POA: Diagnosis not present

## 2018-12-20 DIAGNOSIS — I639 Cerebral infarction, unspecified: Secondary | ICD-10-CM | POA: Diagnosis not present

## 2018-12-20 DIAGNOSIS — H548 Legal blindness, as defined in USA: Secondary | ICD-10-CM | POA: Diagnosis not present

## 2018-12-20 DIAGNOSIS — E119 Type 2 diabetes mellitus without complications: Secondary | ICD-10-CM | POA: Diagnosis not present

## 2018-12-23 DIAGNOSIS — H548 Legal blindness, as defined in USA: Secondary | ICD-10-CM | POA: Diagnosis not present

## 2018-12-23 DIAGNOSIS — E119 Type 2 diabetes mellitus without complications: Secondary | ICD-10-CM | POA: Diagnosis not present

## 2018-12-23 DIAGNOSIS — I639 Cerebral infarction, unspecified: Secondary | ICD-10-CM | POA: Diagnosis not present

## 2018-12-24 DIAGNOSIS — H548 Legal blindness, as defined in USA: Secondary | ICD-10-CM | POA: Diagnosis not present

## 2018-12-24 DIAGNOSIS — I639 Cerebral infarction, unspecified: Secondary | ICD-10-CM | POA: Diagnosis not present

## 2018-12-24 DIAGNOSIS — E119 Type 2 diabetes mellitus without complications: Secondary | ICD-10-CM | POA: Diagnosis not present

## 2018-12-25 DIAGNOSIS — I639 Cerebral infarction, unspecified: Secondary | ICD-10-CM | POA: Diagnosis not present

## 2018-12-25 DIAGNOSIS — H548 Legal blindness, as defined in USA: Secondary | ICD-10-CM | POA: Diagnosis not present

## 2018-12-25 DIAGNOSIS — E119 Type 2 diabetes mellitus without complications: Secondary | ICD-10-CM | POA: Diagnosis not present

## 2018-12-26 DIAGNOSIS — E119 Type 2 diabetes mellitus without complications: Secondary | ICD-10-CM | POA: Diagnosis not present

## 2018-12-26 DIAGNOSIS — H548 Legal blindness, as defined in USA: Secondary | ICD-10-CM | POA: Diagnosis not present

## 2018-12-26 DIAGNOSIS — I639 Cerebral infarction, unspecified: Secondary | ICD-10-CM | POA: Diagnosis not present

## 2018-12-27 DIAGNOSIS — I639 Cerebral infarction, unspecified: Secondary | ICD-10-CM | POA: Diagnosis not present

## 2018-12-27 DIAGNOSIS — H548 Legal blindness, as defined in USA: Secondary | ICD-10-CM | POA: Diagnosis not present

## 2018-12-27 DIAGNOSIS — E119 Type 2 diabetes mellitus without complications: Secondary | ICD-10-CM | POA: Diagnosis not present

## 2018-12-30 DIAGNOSIS — H548 Legal blindness, as defined in USA: Secondary | ICD-10-CM | POA: Diagnosis not present

## 2018-12-30 DIAGNOSIS — E119 Type 2 diabetes mellitus without complications: Secondary | ICD-10-CM | POA: Diagnosis not present

## 2018-12-30 DIAGNOSIS — I639 Cerebral infarction, unspecified: Secondary | ICD-10-CM | POA: Diagnosis not present

## 2018-12-31 DIAGNOSIS — E119 Type 2 diabetes mellitus without complications: Secondary | ICD-10-CM | POA: Diagnosis not present

## 2018-12-31 DIAGNOSIS — H548 Legal blindness, as defined in USA: Secondary | ICD-10-CM | POA: Diagnosis not present

## 2018-12-31 DIAGNOSIS — I639 Cerebral infarction, unspecified: Secondary | ICD-10-CM | POA: Diagnosis not present

## 2019-01-01 DIAGNOSIS — H548 Legal blindness, as defined in USA: Secondary | ICD-10-CM | POA: Diagnosis not present

## 2019-01-01 DIAGNOSIS — I639 Cerebral infarction, unspecified: Secondary | ICD-10-CM | POA: Diagnosis not present

## 2019-01-01 DIAGNOSIS — E119 Type 2 diabetes mellitus without complications: Secondary | ICD-10-CM | POA: Diagnosis not present

## 2019-01-02 DIAGNOSIS — I639 Cerebral infarction, unspecified: Secondary | ICD-10-CM | POA: Diagnosis not present

## 2019-01-02 DIAGNOSIS — H548 Legal blindness, as defined in USA: Secondary | ICD-10-CM | POA: Diagnosis not present

## 2019-01-02 DIAGNOSIS — E119 Type 2 diabetes mellitus without complications: Secondary | ICD-10-CM | POA: Diagnosis not present

## 2019-01-03 DIAGNOSIS — I639 Cerebral infarction, unspecified: Secondary | ICD-10-CM | POA: Diagnosis not present

## 2019-01-03 DIAGNOSIS — E119 Type 2 diabetes mellitus without complications: Secondary | ICD-10-CM | POA: Diagnosis not present

## 2019-01-03 DIAGNOSIS — H548 Legal blindness, as defined in USA: Secondary | ICD-10-CM | POA: Diagnosis not present

## 2019-01-06 DIAGNOSIS — I639 Cerebral infarction, unspecified: Secondary | ICD-10-CM | POA: Diagnosis not present

## 2019-01-06 DIAGNOSIS — E119 Type 2 diabetes mellitus without complications: Secondary | ICD-10-CM | POA: Diagnosis not present

## 2019-01-06 DIAGNOSIS — H548 Legal blindness, as defined in USA: Secondary | ICD-10-CM | POA: Diagnosis not present

## 2019-01-07 DIAGNOSIS — H548 Legal blindness, as defined in USA: Secondary | ICD-10-CM | POA: Diagnosis not present

## 2019-01-07 DIAGNOSIS — I639 Cerebral infarction, unspecified: Secondary | ICD-10-CM | POA: Diagnosis not present

## 2019-01-07 DIAGNOSIS — E119 Type 2 diabetes mellitus without complications: Secondary | ICD-10-CM | POA: Diagnosis not present

## 2019-01-08 DIAGNOSIS — E119 Type 2 diabetes mellitus without complications: Secondary | ICD-10-CM | POA: Diagnosis not present

## 2019-01-08 DIAGNOSIS — H548 Legal blindness, as defined in USA: Secondary | ICD-10-CM | POA: Diagnosis not present

## 2019-01-08 DIAGNOSIS — I639 Cerebral infarction, unspecified: Secondary | ICD-10-CM | POA: Diagnosis not present

## 2019-01-09 DIAGNOSIS — H548 Legal blindness, as defined in USA: Secondary | ICD-10-CM | POA: Diagnosis not present

## 2019-01-09 DIAGNOSIS — I639 Cerebral infarction, unspecified: Secondary | ICD-10-CM | POA: Diagnosis not present

## 2019-01-09 DIAGNOSIS — E119 Type 2 diabetes mellitus without complications: Secondary | ICD-10-CM | POA: Diagnosis not present

## 2019-01-10 DIAGNOSIS — H548 Legal blindness, as defined in USA: Secondary | ICD-10-CM | POA: Diagnosis not present

## 2019-01-10 DIAGNOSIS — I639 Cerebral infarction, unspecified: Secondary | ICD-10-CM | POA: Diagnosis not present

## 2019-01-10 DIAGNOSIS — E119 Type 2 diabetes mellitus without complications: Secondary | ICD-10-CM | POA: Diagnosis not present

## 2019-01-13 DIAGNOSIS — H548 Legal blindness, as defined in USA: Secondary | ICD-10-CM | POA: Diagnosis not present

## 2019-01-13 DIAGNOSIS — E119 Type 2 diabetes mellitus without complications: Secondary | ICD-10-CM | POA: Diagnosis not present

## 2019-01-13 DIAGNOSIS — I639 Cerebral infarction, unspecified: Secondary | ICD-10-CM | POA: Diagnosis not present

## 2019-01-14 DIAGNOSIS — I639 Cerebral infarction, unspecified: Secondary | ICD-10-CM | POA: Diagnosis not present

## 2019-01-14 DIAGNOSIS — H548 Legal blindness, as defined in USA: Secondary | ICD-10-CM | POA: Diagnosis not present

## 2019-01-14 DIAGNOSIS — E119 Type 2 diabetes mellitus without complications: Secondary | ICD-10-CM | POA: Diagnosis not present

## 2019-01-15 DIAGNOSIS — I639 Cerebral infarction, unspecified: Secondary | ICD-10-CM | POA: Diagnosis not present

## 2019-01-15 DIAGNOSIS — H548 Legal blindness, as defined in USA: Secondary | ICD-10-CM | POA: Diagnosis not present

## 2019-01-15 DIAGNOSIS — E119 Type 2 diabetes mellitus without complications: Secondary | ICD-10-CM | POA: Diagnosis not present

## 2019-01-16 DIAGNOSIS — H548 Legal blindness, as defined in USA: Secondary | ICD-10-CM | POA: Diagnosis not present

## 2019-01-16 DIAGNOSIS — E119 Type 2 diabetes mellitus without complications: Secondary | ICD-10-CM | POA: Diagnosis not present

## 2019-01-16 DIAGNOSIS — I639 Cerebral infarction, unspecified: Secondary | ICD-10-CM | POA: Diagnosis not present

## 2019-01-17 DIAGNOSIS — H548 Legal blindness, as defined in USA: Secondary | ICD-10-CM | POA: Diagnosis not present

## 2019-01-17 DIAGNOSIS — I639 Cerebral infarction, unspecified: Secondary | ICD-10-CM | POA: Diagnosis not present

## 2019-01-17 DIAGNOSIS — E119 Type 2 diabetes mellitus without complications: Secondary | ICD-10-CM | POA: Diagnosis not present

## 2019-01-20 DIAGNOSIS — I639 Cerebral infarction, unspecified: Secondary | ICD-10-CM | POA: Diagnosis not present

## 2019-01-20 DIAGNOSIS — H548 Legal blindness, as defined in USA: Secondary | ICD-10-CM | POA: Diagnosis not present

## 2019-01-20 DIAGNOSIS — E119 Type 2 diabetes mellitus without complications: Secondary | ICD-10-CM | POA: Diagnosis not present

## 2019-01-21 DIAGNOSIS — E119 Type 2 diabetes mellitus without complications: Secondary | ICD-10-CM | POA: Diagnosis not present

## 2019-01-21 DIAGNOSIS — I639 Cerebral infarction, unspecified: Secondary | ICD-10-CM | POA: Diagnosis not present

## 2019-01-21 DIAGNOSIS — H548 Legal blindness, as defined in USA: Secondary | ICD-10-CM | POA: Diagnosis not present

## 2019-02-07 DIAGNOSIS — H401133 Primary open-angle glaucoma, bilateral, severe stage: Secondary | ICD-10-CM | POA: Diagnosis not present

## 2019-02-07 DIAGNOSIS — E119 Type 2 diabetes mellitus without complications: Secondary | ICD-10-CM | POA: Diagnosis not present

## 2019-02-07 DIAGNOSIS — Z961 Presence of intraocular lens: Secondary | ICD-10-CM | POA: Diagnosis not present

## 2019-02-07 DIAGNOSIS — H1131 Conjunctival hemorrhage, right eye: Secondary | ICD-10-CM | POA: Diagnosis not present

## 2019-02-07 DIAGNOSIS — H04123 Dry eye syndrome of bilateral lacrimal glands: Secondary | ICD-10-CM | POA: Diagnosis not present

## 2019-02-10 ENCOUNTER — Encounter: Payer: Medicaid Other | Admitting: Internal Medicine

## 2019-02-12 ENCOUNTER — Ambulatory Visit: Payer: Medicaid Other | Admitting: Podiatry

## 2019-02-24 ENCOUNTER — Telehealth: Payer: Self-pay | Admitting: Internal Medicine

## 2019-02-24 ENCOUNTER — Encounter: Payer: Medicaid Other | Admitting: Internal Medicine

## 2019-02-24 ENCOUNTER — Other Ambulatory Visit: Payer: Self-pay

## 2019-02-24 NOTE — Telephone Encounter (Signed)
Attempted to call patient for scheduled telehealth appointment, no answer. Unable to leave VM.

## 2019-02-26 ENCOUNTER — Other Ambulatory Visit: Payer: Self-pay

## 2019-02-26 ENCOUNTER — Ambulatory Visit: Payer: Medicaid Other | Admitting: Podiatry

## 2019-02-26 ENCOUNTER — Encounter: Payer: Self-pay | Admitting: Podiatry

## 2019-02-26 VITALS — Temp 96.8°F

## 2019-02-26 DIAGNOSIS — M79676 Pain in unspecified toe(s): Secondary | ICD-10-CM | POA: Diagnosis not present

## 2019-02-26 DIAGNOSIS — E1142 Type 2 diabetes mellitus with diabetic polyneuropathy: Secondary | ICD-10-CM | POA: Diagnosis not present

## 2019-02-26 DIAGNOSIS — B351 Tinea unguium: Secondary | ICD-10-CM | POA: Diagnosis not present

## 2019-03-02 ENCOUNTER — Encounter: Payer: Self-pay | Admitting: Podiatry

## 2019-03-02 NOTE — Progress Notes (Signed)
Subjective: Brady Church presents with diabetes, diabetic neuropathy and cc of painful, discolored, thick toenails and painful callus/corn which interfere with activities of daily living. Pain is aggravated when wearing enclosed shoe gear. Pain is relieved with periodic professional debridement.  [name], MD is her PCP and last visit ws 11/11/2018.   Current Outpatient Medications:  .  ALPHAGAN P 0.1 % SOLN, INSTILL 1 DROP INTO EACH EYE TWICE DAILY, Disp: , Rfl: 3 .  aspirin 325 MG EC tablet, Take 1 tablet (325 mg total) by mouth daily., Disp: 100 tablet, Rfl: 0 .  atorvastatin (LIPITOR) 80 MG tablet, Take 1 tablet (80 mg total) by mouth daily at 6 PM., Disp: 30 tablet, Rfl: 11 .  Cyanocobalamin (VITAMIN B 12 PO), Take by mouth., Disp: , Rfl:  .  dorzolamide-timolol (COSOPT) 22.3-6.8 MG/ML ophthalmic solution, Place 1 drop into the right eye 2 (two) times daily., Disp: 10 mL, Rfl: 3 .  fluorometholone (FML) 0.1 % ophthalmic suspension, INSTILL 1 DROP INTO EACH EYE ONCE DAILY, Disp: , Rfl: 0 .  fluticasone (FLONASE) 50 MCG/ACT nasal spray, Place 2 sprays daily into both nostrils., Disp: 16 g, Rfl: 2 .  folic acid (FOLVITE) 1 MG tablet, Take 1 tablet (1 mg total) by mouth daily., Disp: 30 tablet, Rfl: 11 .  hydrochlorothiazide (HYDRODIURIL) 25 MG tablet, Take 1 tablet (25 mg total) by mouth daily., Disp: 30 tablet, Rfl: 11 .  lisinopril (PRINIVIL,ZESTRIL) 20 MG tablet, Take 1 tablet (20 mg total) by mouth daily., Disp: 90 tablet, Rfl: 1 .  Multiple Vitamin (MULTIVITAMIN WITH MINERALS) TABS tablet, Take 1 tablet by mouth daily., Disp: 100 tablet, Rfl: 0 .  sildenafil (REVATIO) 20 MG tablet, Take 1 tablet (20 mg total) by mouth See admin instructions. Take 2-5 tablets as needed 30 minutes prior to sexual activity, Disp: 50 tablet, Rfl: 0  No Known Allergies  Vascular Examination: Capillary refill time immediate x 10 digits.  Dorsalis pedis pulses palpable b/l.  Posterior tibial  pulses palpable b/l.  Digital hair sparse x 10 digits.  Skin temperature gradient WNL b/l.  Dermatological Examination: Skin with normal turgor, texture and tone b/l.  Toenails 1-5 b/l discolored, thick, dystrophic with subungual debris and pain with palpation to nailbeds due to thickness of nails.  No open wounds b/l.  No interdigital macerations b/l.  Musculoskeletal: Muscle strength 5/5 to all LE muscle groups  Neurological: Sensation diminished with 10 gram monofilament.  Assessment: 1. Painful onychomycosis toenails 1-5 b/l 2. NIDDM with neuropathy  Plan: 1. Continue diabetic foot care principles. Literature dispensed on today. 2. Toenails 1-5 b/l were debrided in length and girth without iatrogenic bleeding. 3. Patient to continue soft, supportive shoe gear 4. Patient to report any pedal injuries to medical professional  5. Follow up 3 months.  6. Patient/POA to call should there be a concern in the interim.

## 2019-05-28 ENCOUNTER — Encounter: Payer: Self-pay | Admitting: Podiatry

## 2019-05-28 ENCOUNTER — Other Ambulatory Visit: Payer: Self-pay

## 2019-05-28 ENCOUNTER — Ambulatory Visit: Payer: Medicaid Other | Admitting: Podiatry

## 2019-05-28 VITALS — Temp 98.0°F

## 2019-05-28 DIAGNOSIS — B351 Tinea unguium: Secondary | ICD-10-CM

## 2019-05-28 DIAGNOSIS — M79676 Pain in unspecified toe(s): Secondary | ICD-10-CM

## 2019-05-28 DIAGNOSIS — E1142 Type 2 diabetes mellitus with diabetic polyneuropathy: Secondary | ICD-10-CM

## 2019-05-28 NOTE — Patient Instructions (Signed)
Diabetes Mellitus and Foot Care Foot care is an important part of your health, especially when you have diabetes. Diabetes may cause you to have problems because of poor blood flow (circulation) to your feet and legs, which can cause your skin to:  Become thinner and drier.  Break more easily.  Heal more slowly.  Peel and crack. You may also have nerve damage (neuropathy) in your legs and feet, causing decreased feeling in them. This means that you may not notice minor injuries to your feet that could lead to more serious problems. Noticing and addressing any potential problems early is the best way to prevent future foot problems. How to care for your feet Foot hygiene  Wash your feet daily with warm water and mild soap. Do not use hot water. Then, pat your feet and the areas between your toes until they are completely dry. Do not soak your feet as this can dry your skin.  Trim your toenails straight across. Do not dig under them or around the cuticle. File the edges of your nails with an emery board or nail file.  Apply a moisturizing lotion or petroleum jelly to the skin on your feet and to dry, brittle toenails. Use lotion that does not contain alcohol and is unscented. Do not apply lotion between your toes. Shoes and socks  Wear clean socks or stockings every day. Make sure they are not too tight. Do not wear knee-high stockings since they may decrease blood flow to your legs.  Wear shoes that fit properly and have enough cushioning. Always look in your shoes before you put them on to be sure there are no objects inside.  To break in new shoes, wear them for just a few hours a day. This prevents injuries on your feet. Wounds, scrapes, corns, and calluses  Check your feet daily for blisters, cuts, bruises, sores, and redness. If you cannot see the bottom of your feet, use a mirror or ask someone for help.  Do not cut corns or calluses or try to remove them with medicine.  If you  find a minor scrape, cut, or break in the skin on your feet, keep it and the skin around it clean and dry. You may clean these areas with mild soap and water. Do not clean the area with peroxide, alcohol, or iodine.  If you have a wound, scrape, corn, or callus on your foot, look at it several times a day to make sure it is healing and not infected. Check for: ? Redness, swelling, or pain. ? Fluid or blood. ? Warmth. ? Pus or a bad smell. General instructions  Do not cross your legs. This may decrease blood flow to your feet.  Do not use heating pads or hot water bottles on your feet. They may burn your skin. If you have lost feeling in your feet or legs, you may not know this is happening until it is too late.  Protect your feet from hot and cold by wearing shoes, such as at the beach or on hot pavement.  Schedule a complete foot exam at least once a year (annually) or more often if you have foot problems. If you have foot problems, report any cuts, sores, or bruises to your health care provider immediately. Contact a health care provider if:  You have a medical condition that increases your risk of infection and you have any cuts, sores, or bruises on your feet.  You have an injury that is not   healing.  You have redness on your legs or feet.  You feel burning or tingling in your legs or feet.  You have pain or cramps in your legs and feet.  Your legs or feet are numb.  Your feet always feel cold.  You have pain around a toenail. Get help right away if:  You have a wound, scrape, corn, or callus on your foot and: ? You have pain, swelling, or redness that gets worse. ? You have fluid or blood coming from the wound, scrape, corn, or callus. ? Your wound, scrape, corn, or callus feels warm to the touch. ? You have pus or a bad smell coming from the wound, scrape, corn, or callus. ? You have a fever. ? You have a red line going up your leg. Summary  Check your feet every day  for cuts, sores, red spots, swelling, and blisters.  Moisturize feet and legs daily.  Wear shoes that fit properly and have enough cushioning.  If you have foot problems, report any cuts, sores, or bruises to your health care provider immediately.  Schedule a complete foot exam at least once a year (annually) or more often if you have foot problems. This information is not intended to replace advice given to you by your health care provider. Make sure you discuss any questions you have with your health care provider. Document Released: 11/10/2000 Document Revised: 12/26/2017 Document Reviewed: 12/15/2016 Elsevier Patient Education  2020 [employer]  

## 2019-06-01 NOTE — Progress Notes (Signed)
Subjective: Brady Church presents today for preventative diabetic foot care with history of neuropathy. Patient seen for follow up of chronic, painful mycotic toenails which interfere with daily activities and routine tasks.  Pain is aggravated when wearing enclosed shoe gear. Pain is getting progressively worse and relieved with periodic professional debridement.   [name], MD is his PCP.    Current Outpatient Medications:  .  ALPHAGAN P 0.1 % SOLN, INSTILL 1 DROP INTO EACH EYE TWICE DAILY, Disp: , Rfl: 3 .  aspirin 325 MG EC tablet, Take 1 tablet (325 mg total) by mouth daily., Disp: 100 tablet, Rfl: 0 .  atorvastatin (LIPITOR) 80 MG tablet, Take 1 tablet (80 mg total) by mouth daily at 6 PM., Disp: 30 tablet, Rfl: 11 .  Cyanocobalamin (VITAMIN B 12 PO), Take by mouth., Disp: , Rfl:  .  dorzolamide-timolol (COSOPT) 22.3-6.8 MG/ML ophthalmic solution, Place 1 drop into the right eye 2 (two) times daily., Disp: 10 mL, Rfl: 3 .  fluorometholone (FML) 0.1 % ophthalmic suspension, INSTILL 1 DROP INTO EACH EYE ONCE DAILY, Disp: , Rfl: 0 .  folic acid (FOLVITE) 1 MG tablet, Take 1 tablet (1 mg total) by mouth daily., Disp: 30 tablet, Rfl: 11 .  hydrochlorothiazide (HYDRODIURIL) 25 MG tablet, Take 1 tablet (25 mg total) by mouth daily., Disp: 30 tablet, Rfl: 11 .  lisinopril (PRINIVIL,ZESTRIL) 20 MG tablet, Take 1 tablet (20 mg total) by mouth daily., Disp: 90 tablet, Rfl: 1 .  lisinopril (ZESTRIL) 40 MG tablet, Take 40 mg by mouth daily., Disp: , Rfl:  .  Multiple Vitamin (MULTIVITAMIN WITH MINERALS) TABS tablet, Take 1 tablet by mouth daily., Disp: 100 tablet, Rfl: 0 .  sildenafil (REVATIO) 20 MG tablet, Take 1 tablet (20 mg total) by mouth See admin instructions. Take 2-5 tablets as needed 30 minutes prior to sexual activity, Disp: 50 tablet, Rfl: 0 .  fluticasone (FLONASE) 50 MCG/ACT nasal spray, Place 2 sprays daily into both nostrils., Disp: 16 g, Rfl: 2  No Known  Allergies  Objective: Vitals:   05/28/19 1040  Temp: 98 F (36.7 C)    Vascular Examination: Capillary refill time immediate x 10 digits.  Dorsalis pedis pulses present b/l.  Posterior tibial pulses present b/l.  Sparse digital hair x 10 digits.  Skin temperature WNL b/l.  Dermatological Examination: Skin with normal turgor, texture and tone b/l.  Toenails 1-5 b/l discolored, thick, dystrophic with subungual debris and pain with palpation to nailbeds due to thickness of nails.  No open wounds b/l.  No interdigital macerations noted b/l.  Musculoskeletal: Muscle strength 5/5 to all LE muscle groups.  Neurological: Sensation diminished with 10 gram monofilament.  Assessment: 1. Painful onychomycosis toenails 1-5 b/l 2. NIDDM with neuropathy  Plan: 1. Toenails 1-5 b/l were debrided in length and girth without iatrogenic bleeding. 2. Patient to continue soft, supportive shoe gear daily. 3. Patient to report any pedal injuries to medical professional immediately. 4. Follow up 3 months.  5. Patient/POA to call should there be a concern in the interim.

## 2019-06-10 DIAGNOSIS — H401133 Primary open-angle glaucoma, bilateral, severe stage: Secondary | ICD-10-CM | POA: Diagnosis not present

## 2019-06-10 DIAGNOSIS — Z961 Presence of intraocular lens: Secondary | ICD-10-CM | POA: Diagnosis not present

## 2019-06-10 DIAGNOSIS — H04123 Dry eye syndrome of bilateral lacrimal glands: Secondary | ICD-10-CM | POA: Diagnosis not present

## 2019-06-10 DIAGNOSIS — H1131 Conjunctival hemorrhage, right eye: Secondary | ICD-10-CM | POA: Diagnosis not present

## 2019-06-10 DIAGNOSIS — E119 Type 2 diabetes mellitus without complications: Secondary | ICD-10-CM | POA: Diagnosis not present

## 2019-06-10 LAB — HM DIABETES EYE EXAM

## 2019-07-16 ENCOUNTER — Other Ambulatory Visit: Payer: Self-pay | Admitting: Internal Medicine

## 2019-07-16 DIAGNOSIS — E785 Hyperlipidemia, unspecified: Secondary | ICD-10-CM

## 2019-07-16 DIAGNOSIS — I10 Essential (primary) hypertension: Secondary | ICD-10-CM

## 2019-07-16 NOTE — Telephone Encounter (Signed)
Pls sch PCP appt DM FU. Next 90 days

## 2019-07-16 NOTE — Telephone Encounter (Signed)
LM for patient to call back and sch an appt.

## 2019-07-23 ENCOUNTER — Encounter: Payer: Medicaid Other | Admitting: Internal Medicine

## 2019-07-23 ENCOUNTER — Ambulatory Visit: Payer: Medicaid Other | Admitting: Dietician

## 2019-08-06 ENCOUNTER — Ambulatory Visit (INDEPENDENT_AMBULATORY_CARE_PROVIDER_SITE_OTHER): Payer: Medicaid Other | Admitting: Internal Medicine

## 2019-08-06 ENCOUNTER — Ambulatory Visit (INDEPENDENT_AMBULATORY_CARE_PROVIDER_SITE_OTHER): Payer: Medicaid Other | Admitting: Dietician

## 2019-08-06 ENCOUNTER — Other Ambulatory Visit: Payer: Self-pay

## 2019-08-06 ENCOUNTER — Encounter: Payer: Self-pay | Admitting: Internal Medicine

## 2019-08-06 VITALS — BP 132/85 | HR 78 | Temp 98.5°F | Ht 68.0 in | Wt 227.7 lb

## 2019-08-06 DIAGNOSIS — E538 Deficiency of other specified B group vitamins: Secondary | ICD-10-CM | POA: Insufficient documentation

## 2019-08-06 DIAGNOSIS — I10 Essential (primary) hypertension: Secondary | ICD-10-CM

## 2019-08-06 DIAGNOSIS — E1136 Type 2 diabetes mellitus with diabetic cataract: Secondary | ICD-10-CM | POA: Diagnosis not present

## 2019-08-06 DIAGNOSIS — Z79899 Other long term (current) drug therapy: Secondary | ICD-10-CM

## 2019-08-06 DIAGNOSIS — F1010 Alcohol abuse, uncomplicated: Secondary | ICD-10-CM | POA: Diagnosis not present

## 2019-08-06 DIAGNOSIS — F17211 Nicotine dependence, cigarettes, in remission: Secondary | ICD-10-CM

## 2019-08-06 DIAGNOSIS — H269 Unspecified cataract: Secondary | ICD-10-CM

## 2019-08-06 DIAGNOSIS — Z713 Dietary counseling and surveillance: Secondary | ICD-10-CM | POA: Diagnosis not present

## 2019-08-06 LAB — POCT GLYCOSYLATED HEMOGLOBIN (HGB A1C): Hemoglobin A1C: 5.6 % (ref 4.0–5.6)

## 2019-08-06 LAB — GLUCOSE, CAPILLARY: Glucose-Capillary: 115 mg/dL — ABNORMAL HIGH (ref 70–99)

## 2019-08-06 MED ORDER — ADULT MULTIVITAMIN W/MINERALS CH: 1.0000 | ORAL_TABLET | Freq: Every day | ORAL | 5 refills | Status: DC

## 2019-08-06 MED ORDER — FOLIC ACID 1 MG PO TABS: 1.0000 mg | ORAL_TABLET | Freq: Every day | ORAL | 5 refills | Status: DC

## 2019-08-06 MED ORDER — VITAMIN B-1 100 MG PO TABS: 250.0000 mg | ORAL_TABLET | Freq: Every day | ORAL | 5 refills | Status: DC

## 2019-08-06 NOTE — Assessment & Plan Note (Addendum)
Patient has started drinking again, one 40 ounce beer a day. He denies every experiencing withdrawal symptoms. Does not think drinking is a problem for him. Advised cessation, patient says he does not want to quit. Given ongoing alcohol use, will resume his daily folate, thiamine, and multivitamin.  -- Restart folate, thiamine, and multivitamin  -- Checking LFTs  ADDENDUM: Mild elevation of AST, normal ALT. Likely due to his alcohol use. Attempted to call patient with results, no answer. Left message to call back.

## 2019-08-06 NOTE — Assessment & Plan Note (Signed)
Patient has a history of DM diagnosed in 2018 with a Hgb A1c of 14.4. Since then he modified his diet, began to exercise, and was able to be weaned off insulin. Hgb A1c at his last visit was 5.6. Over the past year, we have weaned off his metformin which was discontinued at his last visit nine months ago. Hgb A1c today is unchanged, 5.6. I do worry about his recent relapse in alcohol use and 16 lbs weight gain over the past year. Advised cessation, will continue to monitor.  -- Follow up 6 months

## 2019-08-06 NOTE — Progress Notes (Signed)
Diabetes Self-Management Education  Visit Type: Follow-up  Appt. Start Time: 1030 Appt. End Time: 1100  08/06/2019  Mr. [name], identified by name and date of birth, is a 58 y.o. male with a diagnosis of Diabetes:  Marland Kitchen Type 2  ASSESSMENT  Wt Readings from Last 5 Encounters:  08/06/19 227 lb 11.2 oz (103.3 kg)  11/11/18 211 lb 14.4 oz (96.1 kg)  07/17/18 210 lb 4.8 oz (95.4 kg)  05/06/18 209 lb 1.6 oz (94.8 kg)  01/07/18 217 lb 6.4 oz (98.6 kg)    Lab Results  Component Value Date   HGBA1C 5.6 08/06/2019   HGBA1C 5.6 11/11/2018   HGBA1C 5.6 05/06/2018   HGBA1C 5.8 01/07/2018   HGBA1C 5.7 09/03/2017      Diabetes Self-Management Education - 08/06/19 1300      Visit Information   Visit Type  Follow-up      Health Coping   How would you rate your overall health?  Good      Psychosocial Assessment   Patient Belief/Attitude about Diabetes  Motivated to manage diabetes    Self-care barriers  Impaired vision    Self-management support  Doctor's office;Friends;CDE visits    Patient Concerns  Monitoring    Special Needs  Verbal instruction    Preferred Learning Style  Auditory;Hands on    Learning Readiness  Ready    How often do you need to have someone help you when you read instructions, pamphlets, or other written materials from your doctor or pharmacy?  4 - Often      Pre-Education Assessment   Patient understands monitoring blood glucose, interpreting and using results  Needs Review   sample Accu Chek Guide Me meter provided     Complications   Last HgB A1C per patient/outside source  5.8 %    How often do you check your blood sugar?  0 times/day (not testing)    Have you had a dilated eye exam in the past 12 months?  Yes    Are you checking your feet?  No   sees podiatry every 3 months     Patient Education   Monitoring  Taught/evaluated SMBG meter.      Individualized Goals (developed by patient)   Monitoring   test my blood glucose as discussed       Outcomes   Expected Outcomes  Demonstrated interest in learning. Expect positive outcomes    Future DMSE  3-4 months    Program Status  Completed      Subsequent Visit   Since your last visit have you continued or begun to take your medications as prescribed?  Yes    Since your last visit have you had your blood pressure checked?  Yes    Is your most recent blood pressure lower, unchanged, or higher since your last visit?  Unchanged    Since your last visit have you experienced any weight changes?  Gain    Weight Gain (lbs)  16    Since your last visit, are you checking your blood glucose at least once a day?  No   lost meter      Individualized Plan for Diabetes Self-Management Training:   Learning Objective:  Patient will have a greater understanding of diabetes self-management. Patient education plan is to attend individual and/or group sessions per assessed needs and concerns.   Plan:   There are no Patient Instructions on file for this visit.  Expected Outcomes:  Demonstrated interest in learning.  Expect positive outcomes  Education material provided: Diabetes Resources  If problems or questions, patient to contact team via:  Phone  Future DSME appointment: 3-4 months  Norm Parcel, RD 08/06/2019 2:03 PM.

## 2019-08-06 NOTE — Assessment & Plan Note (Signed)
BP is well controlled, 132/85. Continue lisinopril 40 mg and HCTZ 25 mg daily.  -- Checking renal function today

## 2019-08-06 NOTE — Progress Notes (Signed)
Subjective:   Patient ID: Brady Church male   DOB: [birth year] 58 y.o.   MRN: [id number]  HPI: Mr.Brady Church is a 58 y.o. male with past medical history outlined below here for HTN and DM follow up. For the details of today's visit, please refer to the assessment and plan.   Past Medical History:  Diagnosis Date  . 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 Diabetes mellitus   . High cholesterol   . Hypertension   . Substance abuse (HCC)    cocaine. Stopped in 2010  . Tobacco use disorder 10/07/2014   Current Outpatient Medications  Medication Sig Dispense Refill  . ALPHAGAN P 0.1 % SOLN INSTILL 1 DROP INTO EACH EYE TWICE DAILY  3  . aspirin 325 MG EC tablet Take 1 tablet (325 mg total) by mouth daily. 100 tablet 0  . atorvastatin (LIPITOR) 80 MG tablet TAKE 1 TABLET BY MOUTH ONCE DAILY AT  6  PM 90 tablet 0  . Cyanocobalamin (VITAMIN B 12 PO) Take by mouth.    . dorzolamide-timolol (COSOPT) 22.3-6.8 MG/ML ophthalmic solution Place 1 drop into the right eye 2 (two) times daily. 10 mL 3  . fluorometholone (FML) 0.1 % ophthalmic suspension INSTILL 1 DROP INTO EACH EYE ONCE DAILY  0  . fluticasone (FLONASE) 50 MCG/ACT nasal spray Place 2 sprays daily into both nostrils. 16 g 2  . folic acid (FOLVITE) 1 MG tablet Take 1 tablet (1 mg total) by mouth daily. 30 tablet 5  . hydrochlorothiazide (HYDRODIURIL) 25 MG tablet Take 1 tablet by mouth once daily 90 tablet 0  . lisinopril (ZESTRIL) 40 MG tablet Take 40 mg by mouth daily.    . Multiple Vitamin (MULTIVITAMIN WITH MINERALS) TABS tablet Take 1 tablet by mouth daily. 30 tablet 5  . sildenafil (REVATIO) 20 MG tablet Take 1 tablet (20 mg total) by mouth See admin instructions. Take 2-5 tablets as needed 30 minutes prior to sexual activity 50 tablet 0  . thiamine (VITAMIN B-1) 100 MG tablet Take 2.5 tablets (250 mg total) by mouth daily. 30 tablet 5   No current facility-administered medications for this visit.     Family History  Problem Relation Age of Onset  . Coronary artery disease Mother 63       died of MI  . Cancer Father 26       Some GI cancer. Not sure if it was Colon Cancer or not.  . Hypertension Father   . Diabetes Maternal Grandmother    Social History   Socioeconomic History  . Marital status: Single    Spouse name: Not on file  . Number of children: Not on file  . Years of education: 20  . Highest education level: Not on file  Occupational History  . Not on file  Social Needs  . Financial resource strain: Not on file  . Food insecurity    Worry: Not on file    Inability: Not on file  . Transportation needs    Medical: Not on file    Non-medical: Not on file  Tobacco Use  . Smoking status: Former Smoker    Packs/day: 0.50    Types: Cigarettes    Quit date: 02/18/2017    Years since quitting: 2.4  . Smokeless tobacco: Never Used  . Tobacco comment: About 10 cigs daily. Stopped 2 weeks ago   Substance and Sexual Activity  . Alcohol use: Yes  Alcohol/week: 0.0 standard drinks    Comment: Beer- 40 oz. every day.  . Drug use: No    Types: Cocaine    Comment: Previous cocaine user. Stopped 2 years before.  Marland Kitchen Sexual activity: Not on file  Lifestyle  . Physical activity    Days per week: Not on file    Minutes per session: Not on file  . Stress: Not on file  Relationships  . Social Herbalist on phone: Not on file    Gets together: Not on file    Attends religious service: Not on file    Active member of club or organization: Not on file    Attends meetings of clubs or organizations: Not on file    Relationship status: Not on file  Other Topics Concern  . Not on file  Social History Narrative   Lives in [place] for last 10 years with "friend"  [name]      Is not married and does not have kids.   Working sporadically as a Horticulturist, commercial.    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    Review of Systems: Review  of Systems  Respiratory: Negative for shortness of breath.   Cardiovascular: Negative for chest pain.     Objective:  Physical Exam:  Vitals:   08/06/19 0856  BP: 132/85  Pulse: 78  Temp: 98.5 F (36.9 C)  TempSrc: Oral  SpO2: 98%  Weight: 227 lb 11.2 oz (103.3 kg)  Height: 5\' 8"  (1.727 m)    Physical Exam  Constitutional: He is oriented to person, place, and time and well-developed, well-nourished, and in no distress.  HENT:  Head: Normocephalic and atraumatic.  Cardiovascular: Normal rate, regular rhythm and normal heart sounds. Exam reveals no gallop and no friction rub.  No murmur heard. Pulmonary/Chest: Effort normal and breath sounds normal. No respiratory distress. He has no wheezes.  Musculoskeletal:        General: No edema.  Neurological: He is alert and oriented to person, place, and time. No cranial nerve deficit.  Skin: Skin is warm and dry. No rash noted.  Psychiatric: Mood and affect normal.     Assessment & Plan:

## 2019-08-06 NOTE — Patient Instructions (Signed)
Brady Church,  It was a pleasure to see you today. Please continue to take your medicine as previously prescribed. Please stop taking metformin. I have sent prescriptions for thiamine, folate, and a multivitamin to your pharmacy. Please continue to take these once a day.  I will call you with the results of your blood work. Follow up with me again in 6 months. If you have any questions or concerns, call our clinic at [phone] or after hours call [phone] and ask for the internal medicine resident on call. Thank you!  Dr. Philipp Ovens

## 2019-08-07 ENCOUNTER — Encounter: Payer: Self-pay | Admitting: *Deleted

## 2019-08-07 LAB — CMP14 + ANION GAP
ALT: 31 IU/L (ref 0–44)
AST: 47 IU/L — ABNORMAL HIGH (ref 0–40)
Albumin/Globulin Ratio: 2 (ref 1.2–2.2)
Albumin: 4.1 g/dL (ref 3.8–4.9)
Alkaline Phosphatase: 56 IU/L (ref 39–117)
Anion Gap: 18 mmol/L (ref 10.0–18.0)
BUN/Creatinine Ratio: 14 (ref 9–20)
BUN: 14 mg/dL (ref 6–24)
Bilirubin Total: 0.3 mg/dL (ref 0.0–1.2)
CO2: 19 mmol/L — ABNORMAL LOW (ref 20–29)
Calcium: 9.1 mg/dL (ref 8.7–10.2)
Chloride: 101 mmol/L (ref 96–106)
Creatinine, Ser: 1 mg/dL (ref 0.76–1.27)
GFR calc Af Amer: 97 mL/min/{1.73_m2} (ref >59)
GFR calc non Af Amer: 84 mL/min/{1.73_m2} (ref >59)
Globulin, Total: 2.1 g/dL (ref 1.5–4.5)
Glucose: 94 mg/dL (ref 65–99)
Potassium: 4 mmol/L (ref 3.5–5.2)
Sodium: 138 mmol/L (ref 134–144)
Total Protein: 6.2 g/dL (ref 6.0–8.5)

## 2019-08-07 LAB — CBC
Hematocrit: 38.1 % (ref 37.5–51.0)
Hemoglobin: 13.6 g/dL (ref 13.0–17.7)
MCH: 33.7 pg — ABNORMAL HIGH (ref 26.6–33.0)
MCHC: 35.7 g/dL (ref 31.5–35.7)
MCV: 94 fL (ref 79–97)
Platelets: 227 10*3/uL (ref 150–450)
RBC: 4.04 x10E6/uL — ABNORMAL LOW (ref 4.14–5.80)
RDW: 14.1 % (ref 11.6–15.4)
WBC: 5.1 10*3/uL (ref 3.4–10.8)

## 2019-08-08 ENCOUNTER — Encounter: Payer: Self-pay | Admitting: Dietician

## 2019-08-13 ENCOUNTER — Telehealth: Payer: Self-pay

## 2019-08-13 NOTE — Telephone Encounter (Signed)
Dr. Philipp Ovens tried calling last week. Nothing urgent, can wait until Dr. Philipp Ovens returns to the office tomorrow.

## 2019-08-13 NOTE — Telephone Encounter (Signed)
Requesting lab results. Please call back.  

## 2019-08-14 NOTE — Telephone Encounter (Signed)
Attempted to call patient. Woman answered the phone and said he just left and was unavailable. If patient calls back again, please let him know that his labs showed very mild liver damage from his drinking. I recommend that he stop drinking. We will recheck the labs at his next appointment. Thanks!

## 2019-08-14 NOTE — Telephone Encounter (Signed)
Attempted to call pt, no answer, no vmail 

## 2019-08-14 NOTE — Telephone Encounter (Signed)
Dr Philipp Ovens, pt wants you to call him about his results

## 2019-08-15 ENCOUNTER — Other Ambulatory Visit: Payer: Self-pay

## 2019-08-15 ENCOUNTER — Encounter: Payer: Self-pay | Admitting: Podiatry

## 2019-08-15 ENCOUNTER — Ambulatory Visit: Payer: Medicaid Other | Admitting: Podiatry

## 2019-08-15 DIAGNOSIS — B351 Tinea unguium: Secondary | ICD-10-CM

## 2019-08-15 DIAGNOSIS — M79676 Pain in unspecified toe(s): Secondary | ICD-10-CM

## 2019-08-15 DIAGNOSIS — L84 Corns and callosities: Secondary | ICD-10-CM

## 2019-08-15 DIAGNOSIS — E1142 Type 2 diabetes mellitus with diabetic polyneuropathy: Secondary | ICD-10-CM | POA: Diagnosis not present

## 2019-08-15 NOTE — Patient Instructions (Signed)
Diabetic Neuropathy Diabetic neuropathy refers to nerve damage that is caused by diabetes (diabetes mellitus). Over time, people with diabetes can develop nerve damage throughout the body. There are several types of diabetic neuropathy:  Peripheral neuropathy. This is the most common type of diabetic neuropathy. It causes damage to nerves that carry signals between the spinal cord and other parts of the body (peripheral nerves). This usually affects nerves in the feet and legs first, and may eventually affect the hands and arms. The damage affects the ability to sense touch or temperature.  Autonomic neuropathy. This type causes damage to nerves that control involuntary functions (autonomic nerves). These nerves carry signals that control: ? Heartbeat. ? Body temperature. ? Blood pressure. ? Urination. ? Digestion. ? Sweating. ? Sexual function. ? Response to changing blood sugar (glucose) levels.  Focal neuropathy. This type of nerve damage affects one area of the body, such as an arm, a leg, or the face. The injury may involve one nerve or a small group of nerves. Focal neuropathy can be painful and unpredictable, and occurs most often in older adults with diabetes. This often develops suddenly, but usually improves over time and does not cause long-term problems.  Proximal neuropathy. This type of nerve damage affects the nerves of the thighs, hips, buttocks, or legs. It causes severe pain, weakness, and muscle death (atrophy), usually in the thigh muscles. It is more common among older men and people who have type 2 diabetes. The length of recovery time may vary. What are the causes? Peripheral, autonomic, and focal neuropathies are caused by diabetes that is not well controlled with treatment. The cause of proximal neuropathy is not known, but it may be caused by inflammation related to uncontrolled blood glucose levels. What are the signs or symptoms? Peripheral neuropathy Peripheral  neuropathy develops slowly over time. When the nerves of the feet and legs no longer work, you may experience:  Burning, stabbing, or aching pain in the legs or feet.  Pain or cramping in the legs or feet.  Loss of feeling (numbness) and inability to feel pressure or pain in the feet. This can lead to: ? Thick calluses or sores on areas of constant pressure. ? Ulcers. ? Reduced ability to feel temperature changes.  Foot deformities.  Muscle weakness.  Loss of balance or coordination. Autonomic neuropathy The symptoms of autonomic neuropathy vary depending on which nerves are affected. Symptoms may include:  Problems with digestion, such as: ? Nausea or vomiting. ? Poor appetite. ? Bloating. ? Diarrhea or constipation. ? Trouble swallowing. ? Losing weight without trying to.  Problems with the heart, blood and lungs, such as: ? Dizziness, especially when standing up. ? Fainting. ? Shortness of breath. ? Irregular heartbeat.  Bladder problems, such as: ? Trouble starting or stopping urination. ? Leaking urine. ? Trouble emptying the bladder. ? Urinary tract infections (UTIs).  Problems with other body functions, such as: ? Sweat. You may sweat too much or too little. ? Temperature. You might get hot easily. Or, you might feel cold more than usual. ? Sexual function. Men may not be able to get or maintain an erection. Women may have vaginal dryness and difficulty with arousal. Focal neuropathy Symptoms affect only one area of the body. Common symptoms include:  Numbness.  Tingling.  Burning pain.  Prickling feeling.  Very sensitive skin.  Weakness.  Inability to move (paralysis).  Muscle twitching.  Muscles getting smaller (wasting).  Poor coordination.  Double or blurred vision. Proximal   neuropathy  Sudden, severe pain in the hip, thigh, or buttocks. Pain may spread from the back into the legs (sciatica).  Pain and numbness in the arms and legs.   Tingling.  Loss of bladder control or bowel control.  Weakness and wasting of thigh muscles.  Difficulty getting up from a seated position.  Abdominal swelling.  Unexplained weight loss. How is this diagnosed? Diagnosis usually involves reviewing your medical history and any symptoms you have. Diagnosis varies depending on the type of neuropathy your health care provider suspects. Peripheral neuropathy Your health care provider will check areas that are affected by your nervous system (neurologic exam), such as your reflexes, how you move, and what you can feel. You may have other tests, such as:  Blood tests.  Removal and examination of fluid that surrounds the spinal cord (lumbar puncture).  CT scan.  MRI.  A test to check the nerves that control muscles (electromyogram, EMG).  Tests of how quickly messages pass through your nerves (nerve conduction velocity tests).  Removal of a small piece of nerve to be examined under a microscope (biopsy). Autonomic neuropathy You may have tests, such as:  Tests to measure your blood pressure and heart rate. This may include monitoring you while you are safely secured to an exam table that moves you from a lying position to an upright position (table tilt test).  Breathing tests to check your lungs.  Tests to check how food moves through the digestive system (gastric emptying tests).  Blood, sweat, or urine tests.  Ultrasound of your bladder.  Spinal fluid tests. Focal neuropathy This condition may be diagnosed with:  A neurologic exam.  CT scan.  MRI.  EMG.  Nerve conduction velocity tests. Proximal neuropathy There is no test to diagnose this type of neuropathy. You may have tests to rule out other possible causes of this type of neuropathy. Tests may include:  X-rays of your spine and lumbar region.  Lumbar puncture.  MRI. How is this treated? The goal of treatment is to keep nerve damage from getting worse.  The most important part of treatment is keeping your blood glucose level and your A1C level within your target range by following your diabetes management plan. Over time, maintaining lower blood glucose levels helps lessen symptoms. In some cases, you may need prescription pain medicine. Follow these instructions at home:  Lifestyle   Do not use any products that contain nicotine or tobacco, such as cigarettes and e-cigarettes. If you need help quitting, ask your health care provider.  Be physically active every day. Include strength training and balance exercises.  Follow a healthy meal plan.  Work with your health care provider to manage your blood pressure. General instructions  Follow your diabetes management plan as directed. ? Check your blood glucose levels as directed by your health care provider. ? Keep your blood glucose in your target range as directed by your health care provider. ? Have your A1C level checked at least two times a year, or as often as told by your health care provider.  Take over the counter and prescription medicines only as told by your health care provider. This includes insulin and diabetes medicine.  Do not drive or use heavy machinery while taking prescription pain medicines.  Check your skin and feet every day for cuts, bruises, redness, blisters, or sores.  Keep all follow up visits as told by your health care provider. This is important. Contact a health care provider if:    You have burning, stabbing, or aching pain in your legs or feet.  You are unable to feel pressure or pain in your feet.  You develop problems with digestion, such as: ? Nausea. ? Vomiting. ? Bloating. ? Constipation. ? Diarrhea. ? Abdominal pain.  You have difficulty with urination, such as inability: ? To control when you urinate (incontinence). ? To completely empty the bladder (retention).  You have palpitations.  You feel dizzy, weak, or faint when you stand  up. Get help right away if:  You cannot urinate.  You have sudden weakness or loss of coordination.  You have trouble speaking.  You have pain or pressure in your chest.  You have an irregular heart beat.  You have sudden inability to move a part of your body. Summary  Diabetic neuropathy refers to nerve damage that is caused by diabetes. It can affect nerves throughout the entire body, causing numbness and pain in the arms, legs, digestive tract, heart, and other body systems.  Keep your blood glucose level and your blood pressure in your target range, as directed by your health care provider. This can help prevent neuropathy from getting worse.  Check your skin and feet every day for cuts, bruises, redness, blisters, or sores.  Do not use any products that contain nicotine or tobacco, such as cigarettes and e-cigarettes. If you need help quitting, ask your health care provider. This information is not intended to replace advice given to you by your health care provider. Make sure you discuss any questions you have with your health care provider. Document Released: 01/22/2002 Document Revised: 12/26/2017 Document Reviewed: 12/18/2016 Elsevier Patient Education  2020 [employer]  Onychomycosis/Fungal Toenails  WHAT IS IT? An infection that lies within the keratin of your nail plate that is caused by a fungus.  WHY ME? Fungal infections affect all ages, sexes, races, and creeds.  There may be many factors that predispose you to a fungal infection such as age, coexisting medical conditions such as diabetes, or an autoimmune disease; stress, medications, fatigue, genetics, etc.  Bottom line: fungus thrives in a warm, moist environment and your shoes offer such a location.  IS IT CONTAGIOUS? Theoretically, yes.  You do not want to share shoes, nail clippers or files with someone who has fungal toenails.  Walking around barefoot in the same room or sleeping in the same bed is unlikely  to transfer the organism.  It is important to realize, however, that fungus can spread easily from one nail to the next on the same foot.  HOW DO WE TREAT THIS?  There are several ways to treat this condition.  Treatment may depend on many factors such as age, medications, pregnancy, liver and kidney conditions, etc.  It is best to ask your doctor which options are available to you.  1. No treatment.   Unlike many other medical concerns, you can live with this condition.  However for many people this can be a painful condition and may lead to ingrown toenails or a bacterial infection.  It is recommended that you keep the nails cut short to help reduce the amount of fungal nail. 2. Topical treatment.  These range from herbal remedies to prescription strength nail lacquers.  About 40-50% effective, topicals require twice daily application for approximately 9 to 12 months or until an entirely new nail has grown out.  The most effective topicals are medical grade medications available through physicians offices. 3. Oral antifungal medications.  With an 80-90% cure   rate, the most common oral medication requires 3 to 4 months of therapy and stays in your system for a year as the new nail grows out.  Oral antifungal medications do require blood work to make sure it is a safe drug for you.  A liver function panel will be performed prior to starting the medication and after the first month of treatment.  It is important to have the blood work performed to avoid any harmful side effects.  In general, this medication safe but blood work is required. 4. Laser Therapy.  This treatment is performed by applying a specialized laser to the affected nail plate.  This therapy is noninvasive, fast, and non-painful.  It is not covered by insurance and is therefore, out of pocket.  The results have been very good with a 80-95% cure rate.  [facility] is the only practice in the area to offer this therapy. 5. Permanent  Nail Avulsion.  Removing the entire nail so that a new nail will not grow back. 

## 2019-08-21 ENCOUNTER — Telehealth: Payer: Self-pay

## 2019-08-21 NOTE — Progress Notes (Signed)
Subjective: Brady Church presents to clinic with cc of painful mycotic toenails and calluses b/l feet which are aggravated when weightbearing with and without shoe gear.  This pain limits his daily activities. Pain symptoms resolve with periodic professional debridement.  [name], MD is his PCP.   Current Outpatient Medications on File Prior to Visit  Medication Sig Dispense Refill  . ALPHAGAN P 0.1 % SOLN INSTILL 1 DROP INTO EACH EYE TWICE DAILY  3  . aspirin 325 MG EC tablet Take 1 tablet (325 mg total) by mouth daily. 100 tablet 0  . atorvastatin (LIPITOR) 80 MG tablet TAKE 1 TABLET BY MOUTH ONCE DAILY AT  6  PM 90 tablet 0  . Cyanocobalamin (VITAMIN B 12 PO) Take by mouth.    . dorzolamide-timolol (COSOPT) 22.3-6.8 MG/ML ophthalmic solution Place 1 drop into the right eye 2 (two) times daily. 10 mL 3  . fluorometholone (FML) 0.1 % ophthalmic suspension INSTILL 1 DROP INTO EACH EYE ONCE DAILY  0  . fluticasone (FLONASE) 50 MCG/ACT nasal spray Place 2 sprays daily into both nostrils. 16 g 2  . folic acid (FOLVITE) 1 MG tablet Take 1 tablet (1 mg total) by mouth daily. 30 tablet 5  . hydrochlorothiazide (HYDRODIURIL) 25 MG tablet Take 1 tablet by mouth once daily 90 tablet 0  . latanoprost (XALATAN) 0.005 % ophthalmic solution INSTILL 1 DROP INTO EACH EYE AT BEDTIME    . lisinopril (ZESTRIL) 40 MG tablet Take 40 mg by mouth daily.    . Multiple Vitamin (MULTIVITAMIN WITH MINERALS) TABS tablet Take 1 tablet by mouth daily. 30 tablet 5  . sildenafil (REVATIO) 20 MG tablet Take 1 tablet (20 mg total) by mouth See admin instructions. Take 2-5 tablets as needed 30 minutes prior to sexual activity 50 tablet 0  . thiamine (VITAMIN B-1) 100 MG tablet Take 2.5 tablets (250 mg total) by mouth daily. 30 tablet 5   No current facility-administered medications on file prior to visit.      No Known Allergies   Objective: Physical Examination:  Vascular  Examination: Capillary refill  time immediate x 10 digits.  Palpable DP/PT pulses b/l.  Digital hair sparse b/l.  No edema noted b/l.  Skin temperature gradient WNL b/l.  Dermatological Examination: Skin with normal turgor, texture and tone b/l.  No open wounds b/l.  No interdigital macerations noted b/l.  Elongated, thick, discolored brittle toenails with subungual debris and pain on dorsal palpation of nailbeds 1-5 b/l.  Hyperkeratotic lesion submet head 1 b/l and b/l hallux IPJ with tenderness to palpation. No edema, no erythema, no drainage, no flocculence.   Musculoskeletal Examination: Muscle strength 5/5 to all muscle groups b/l.  No pain, crepitus or joint discomfort with active/passive ROM.  Neurological Examination: Sensation diminished b/l with 10 gram monofilament.  Assessment: 1. Mycotic nail infection with pain 1-5 b/l 2. Calluses submet head 1 b/l and b/l hallux 3. NIDDM with neuropathy  Plan: 1. Toenails 1-5 b/l were debrided in length and girth without iatrogenic laceration. 2. Calluses pared submetatarsal head(s) 1 b/l and b/l hallux utilizing sterile scalpel blade without incident. 3. Continue soft, supportive shoe gear daily. 4. Report any pedal injuries to medical professional. 5. Follow up 3 months. 6. Patient/POA to call should there be a question/concern in there interim.

## 2019-08-21 NOTE — Telephone Encounter (Signed)
Requesting lab results. Please call back.  

## 2019-08-21 NOTE — Telephone Encounter (Signed)
This is being addressed in separate phone encounter started on 08/13/2019. Hubbard Hartshorn, BSN, RN-BC

## 2019-08-21 NOTE — Telephone Encounter (Signed)
Patient called in for lab results. Returned call to patient. Recording states call cannot be completed at this time. Hubbard Hartshorn, BSN, RN-BC

## 2019-08-22 NOTE — Telephone Encounter (Signed)
Patient called back. Relayed info from PCP below. States if he stops drinking he will be really stressed. Requesting to speak with PCP as soon as possible. First available appt with PCP is not till 10/29/2019. Patient did not want an [facility] appt to be seen sooner. Appt sched with PCP for 10/29/2019 at 0915. Hubbard Hartshorn, BSN, RN-BC

## 2019-08-22 NOTE — Telephone Encounter (Signed)
Attempted to call patient x 2 with no answer

## 2019-08-26 ENCOUNTER — Ambulatory Visit: Payer: Medicaid Other | Admitting: Podiatry

## 2019-09-23 ENCOUNTER — Other Ambulatory Visit: Payer: Self-pay | Admitting: Internal Medicine

## 2019-09-23 DIAGNOSIS — I10 Essential (primary) hypertension: Secondary | ICD-10-CM

## 2019-09-23 DIAGNOSIS — E785 Hyperlipidemia, unspecified: Secondary | ICD-10-CM

## 2019-10-29 ENCOUNTER — Encounter: Payer: Medicaid Other | Admitting: Internal Medicine

## 2019-11-04 ENCOUNTER — Other Ambulatory Visit: Payer: Self-pay

## 2019-11-04 ENCOUNTER — Ambulatory Visit: Payer: Medicaid Other | Admitting: Podiatry

## 2019-11-04 ENCOUNTER — Encounter: Payer: Self-pay | Admitting: Podiatry

## 2019-11-04 DIAGNOSIS — B351 Tinea unguium: Secondary | ICD-10-CM | POA: Diagnosis not present

## 2019-11-04 DIAGNOSIS — M79676 Pain in unspecified toe(s): Secondary | ICD-10-CM

## 2019-11-05 ENCOUNTER — Ambulatory Visit (INDEPENDENT_AMBULATORY_CARE_PROVIDER_SITE_OTHER): Payer: Medicaid Other | Admitting: Dietician

## 2019-11-05 ENCOUNTER — Other Ambulatory Visit: Payer: Self-pay

## 2019-11-05 ENCOUNTER — Encounter: Payer: Self-pay | Admitting: Dietician

## 2019-11-05 ENCOUNTER — Encounter: Payer: Self-pay | Admitting: Internal Medicine

## 2019-11-05 ENCOUNTER — Ambulatory Visit (INDEPENDENT_AMBULATORY_CARE_PROVIDER_SITE_OTHER): Payer: Medicaid Other | Admitting: Internal Medicine

## 2019-11-05 VITALS — BP 139/91 | HR 70 | Temp 98.1°F | Ht 68.0 in | Wt 235.0 lb

## 2019-11-05 DIAGNOSIS — E1136 Type 2 diabetes mellitus with diabetic cataract: Secondary | ICD-10-CM

## 2019-11-05 DIAGNOSIS — Z79899 Other long term (current) drug therapy: Secondary | ICD-10-CM | POA: Diagnosis not present

## 2019-11-05 DIAGNOSIS — Z713 Dietary counseling and surveillance: Secondary | ICD-10-CM

## 2019-11-05 DIAGNOSIS — Z8673 Personal history of transient ischemic attack (TIA), and cerebral infarction without residual deficits: Secondary | ICD-10-CM

## 2019-11-05 DIAGNOSIS — Z6835 Body mass index (BMI) 35.0-35.9, adult: Secondary | ICD-10-CM | POA: Diagnosis not present

## 2019-11-05 DIAGNOSIS — E785 Hyperlipidemia, unspecified: Secondary | ICD-10-CM

## 2019-11-05 DIAGNOSIS — Z23 Encounter for immunization: Secondary | ICD-10-CM

## 2019-11-05 DIAGNOSIS — H269 Unspecified cataract: Secondary | ICD-10-CM | POA: Diagnosis not present

## 2019-11-05 DIAGNOSIS — F1010 Alcohol abuse, uncomplicated: Secondary | ICD-10-CM

## 2019-11-05 DIAGNOSIS — Z87891 Personal history of nicotine dependence: Secondary | ICD-10-CM | POA: Diagnosis not present

## 2019-11-05 LAB — POCT GLYCOSYLATED HEMOGLOBIN (HGB A1C): Hemoglobin A1C: 6.3 % — AB (ref 4.0–5.6)

## 2019-11-05 LAB — GLUCOSE, CAPILLARY: Glucose-Capillary: 109 mg/dL — ABNORMAL HIGH (ref 70–99)

## 2019-11-05 NOTE — Patient Instructions (Addendum)
Doing great!!!  Your Goal to help improve your health and to decrease risk to damaging your liver  is to start drinking  Water, gatorade and fruit juice instead of drinking beer/liquor  Please follow up in about 3-4 months  Butch Penny [phone]

## 2019-11-05 NOTE — Assessment & Plan Note (Addendum)
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 He reports compliance. Will repeat lipid panel today, goal LDL < 100. Consider addition of zetia or changing to crestor if still uncontrolled.   ADDENDUM: LDL is mildly above goal, 102. Will hold off on making changes for now. Repeat again at follow up.

## 2019-11-05 NOTE — Assessment & Plan Note (Signed)
Currently diet controlled. Hgb A1c has increased today, 6.3 from from 5.6 three months ago. He continues to meet with our diabetic dietitian. Suspect this increase is due to his alcohol use. He has a history of alcohol abuse and was previously abstinent for a couple of years. He began drinking again at our last visit. Advised cessation. Will continue to monitor off therapy.

## 2019-11-05 NOTE — Progress Notes (Signed)
Subjective:   Patient ID: Brady Church male   DOB: 06-Nov-1961 58 y.o.   MRN: [id number]  HPI: Brady Church is a 58 y.o. male with past medical history outlined below here for follow up of his alcohol use. For the details of today's visit, please refer to the assessment and plan.   Past Medical History:  Diagnosis Date  . 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 Diabetes mellitus   . High cholesterol   . Hypertension   . Substance abuse (HCC)    cocaine. Stopped in 2010  . Tobacco use disorder 10/07/2014   Current Outpatient Medications  Medication Sig Dispense Refill  . ALPHAGAN P 0.1 % SOLN INSTILL 1 DROP INTO EACH EYE TWICE DAILY  3  . aspirin 325 MG EC tablet Take 1 tablet (325 mg total) by mouth daily. 100 tablet 0  . atorvastatin (LIPITOR) 80 MG tablet TAKE 1 TABLET BY MOUTH ONCE DAILY AT  6  PM 90 tablet 0  . Cyanocobalamin (VITAMIN B 12 PO) Take by mouth.    . dorzolamide-timolol (COSOPT) 22.3-6.8 MG/ML ophthalmic solution Place 1 drop into the right eye 2 (two) times daily. (Patient not taking: Reported on 11/05/2019) 10 mL 3  . fluorometholone (FML) 0.1 % ophthalmic suspension INSTILL 1 DROP INTO EACH EYE ONCE DAILY  0  . fluticasone (FLONASE) 50 MCG/ACT nasal spray Place 2 sprays daily into both nostrils. 16 g 2  . folic acid (FOLVITE) 1 MG tablet Take 1 tablet (1 mg total) by mouth daily. 30 tablet 5  . hydrochlorothiazide (HYDRODIURIL) 25 MG tablet Take 1 tablet by mouth once daily 90 tablet 0  . latanoprost (XALATAN) 0.005 % ophthalmic solution INSTILL 1 DROP INTO EACH EYE AT BEDTIME    . lisinopril (ZESTRIL) 40 MG tablet Take 1 tablet by mouth once daily 90 tablet 0  . Multiple Vitamin (MULTIVITAMIN WITH MINERALS) TABS tablet Take 1 tablet by mouth daily. (Patient not taking: Reported on 11/05/2019) 30 tablet 5  . sildenafil (REVATIO) 20 MG tablet Take 1 tablet (20 mg total) by mouth See admin instructions. Take 2-5 tablets as needed 30  minutes prior to sexual activity 50 tablet 0  . thiamine (VITAMIN B-1) 100 MG tablet Take 2.5 tablets (250 mg total) by mouth daily. (Patient not taking: Reported on 11/05/2019) 30 tablet 5   No current facility-administered medications for this visit.    Family History  Problem Relation Age of Onset  . Coronary artery disease Mother 58       died of MI  . Cancer Father 24       Some GI cancer. Not sure if it was Colon Cancer or not.  . Hypertension Father   . Diabetes Maternal Grandmother    Social History   Socioeconomic History  . Marital status: Single    Spouse name: Not on file  . Number of children: Not on file  . Years of education: 61  . Highest education level: Not on file  Occupational History  . Not on file  Social Needs  . Financial resource strain: Not on file  . Food insecurity    Worry: Not on file    Inability: Not on file  . Transportation needs    Medical: Not on file    Non-medical: Not on file  Tobacco Use  . Smoking status: Former Smoker    Packs/day: 0.50    Types: Cigarettes    Quit  date: 02/18/2017    Years since quitting: 2.7  . Smokeless tobacco: Never Used  . Tobacco comment: Quit over 1 year ago.  Substance and Sexual Activity  . Alcohol use: Yes    Alcohol/week: 0.0 standard drinks    Comment: Beer- 40 oz. every day.  . Drug use: No    Types: Cocaine    Comment: Previous cocaine user. Stopped 2 years before.  Marland Kitchen Sexual activity: Not on file  Lifestyle  . Physical activity    Days per week: Not on file    Minutes per session: Not on file  . Stress: Not on file  Relationships  . Social Herbalist on phone: Not on file    Gets together: Not on file    Attends religious service: Not on file    Active member of club or organization: Not on file    Attends meetings of clubs or organizations: Not on file    Relationship status: Not on file  Other Topics Concern  . Not on file  Social History Narrative   Lives in [place]  for last 10 years with "friend"  [name]      Is not married and does not have kids.   Working sporadically as a Horticulturist, commercial.    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    Review of Systems: Review of Systems  Respiratory: Negative for shortness of breath.   Cardiovascular: Negative for chest pain.  Gastrointestinal: Negative for abdominal pain.     Objective:  Physical Exam:  Vitals:   11/05/19 1014  BP: (!) 139/91  Pulse: 70  Temp: 98.1 F (36.7 C)  TempSrc: Oral  SpO2: 100%  Weight: 235 lb (106.6 kg)  Height: 5\' 8"  (1.727 m)    Physical Exam  Constitutional: He is oriented to person, place, and time and well-developed, well-nourished, and in no distress. No distress.  HENT:  Head: Normocephalic and atraumatic.  Cardiovascular: Normal rate, regular rhythm and normal heart sounds.  No murmur heard. Pulmonary/Chest: Effort normal and breath sounds normal. No respiratory distress.  Abdominal: Soft. Bowel sounds are normal. He exhibits distension. There is no abdominal tenderness.  Neurological: He is alert and oriented to person, place, and time.     Assessment & Plan:   See Encounters Tab for problem based charting.

## 2019-11-05 NOTE — Progress Notes (Signed)
Diabetes Self-Management Education  Visit Type: Follow-up  Appt. Start Time: 920 Appt. End Time: 1000  11/05/2019  Mr. Brady Church, identified by name and date of birth, is a 58 y.o. male with a diagnosis of Diabetes:  Marland Kitchen Type 2 diabetes  ASSESSMENT  Brady Church presents for his annual followup.  He has an appointment on Dec 28th at Dr. Zenia Resides office. He pPlans to quit drinking alcohol the first of the year because he.knows it causes his liver damage.  Used "Jesus" last time he quit for a year. Plans to go "cold [place]" and use Jesus again this time.  I informed him that we have a behavioral health professional here if he feels he needs one.  He feels his weigh gain is due to eating in the middle of the night that is caused by him going to bed too early. At  7-8 goes to bed, gets up 2-3 am and eats ;leftovers, goes back to sleep until 4:45am, eats breakfast 6 am  He plans to address this by going to bed later to see if this helps you sleep through the night without snacking.   Weight 235 lb 11.2 oz (106.9 kg). Body mass index is 35.84 kg/m.  Diabetes Self-Management Education - 11/05/19 0900      Visit Information   Visit Type  Follow-up      Health Coping   How would you rate your overall health?  Good      Pre-Education Assessment   Patient understands the diabetes disease and treatment process.  Needs Review    Patient understands incorporating nutritional management into lifestyle.  Demonstrates understanding / competency    Patient undertands incorporating physical activity into lifestyle.  Demonstrates understanding / competency    Patient understands monitoring blood glucose, interpreting and using results  Demonstrates understanding / competency    Patient understands prevention, detection, and treatment of acute complications.  Needs Review    Patient understands prevention, detection, and treatment of chronic complications.  Needs Review    Patient understands  how to develop strategies to address psychosocial issues.  Demonstrates understanding / competency      Complications   Last HgB A1C per patient/outside source  5.6 %    How often do you check your blood sugar?  0 times/day (not testing)    Number of hyperglycemic episodes per week  0    Have you had a dilated eye exam in the past 12 months?  Yes    Have you had a dental exam in the past 12 months?  No   plans to call dentist to for a chekc up-Brady Church   Are you checking your feet?  [name]   sees podaitry every 2 months     Dietary Intake   Breakfast  grits, 3 strips bacon, egg, water   at the house   Lunch  ham, bologna or [place] & cheese sandw, chips, small cake   at the house   Dinner  baked chicken or meatloaf, cabbage, mac & cheese, beans    Snack (evening)  leftovers from suppor in midel of the night    Beverage(s)  water,beer, 40 oz per day, coffee about every other day      Exercise   Exercise Type  ADL's;Light (walking / raking leaves)    How many days per week to you exercise?  --   7   How many minutes per day do you exercise?  [place]  Patient Education   Previous Diabetes Education  Yes (please comment)    Disease state   Definition of diabetes, type 1 and 2, and the diagnosis of diabetes;Factors that contribute to the development of diabetes    Acute complications  Discussed and identified patients' treatment of hyperglycemia.    Chronic complications  Dental care;Nephropathy, what it is, prevention of, the use of ACE, ARB's and early detection of through urine microalbumia.;Reviewed with patient heart disease, higher risk of, and prevention      Individualized Goals (developed by patient)   Reducing Risk  Other (comment)   quite drinking beer/liquor     Outcomes   Expected Outcomes  Demonstrated interest in learning. Expect positive outcomes    Future DMSE  3-4 months    Program Status  Completed      Subsequent Visit   Since your last visit have you continued  or begun to take your medications as prescribed?  Yes    Since your last visit have you had your blood pressure checked?  No    Since your last visit have you experienced any weight changes?  Gain    Weight Gain (lbs)  8    Since your last visit, are you checking your blood glucose at least once a day?  No       Individualized Plan for Diabetes Self-Management Training:   Learning Objective:  Patient will have a greater understanding of diabetes self-management. Patient education plan is to attend individual and/or group sessions per assessed needs and concerns.   Plan:   Patient Instructions  Doing great!!!  Your Goal to help improve your health and to decrease risk to damaging your liver  is to start drinking  Water, gatorade and fruit juice instead of drinking beer/liquor  Please follow up in about 3-4 months  Butch Penny ([phone]) [phone]    Expected Outcomes:  Demonstrated interest in learning. Expect positive outcomes  Education material provided: Diabetes Resources  If problems or questions, patient to contact team via:  Phone  Future DSME appointment: 3-4 months  Debera Lat, RD 11/05/2019 10:28 AM.

## 2019-11-05 NOTE — Assessment & Plan Note (Addendum)
Patient admitted to drinking again at our last visit. LFTs were checked and showed a mild elevation of AST. He reports drinking one 40 ounce beer a day. We discussed naltrexone therapy to help with cessation but patient prefers to quit on his own. He has set a goal to stop drinking by January 1st. Will recheck LFTs today and check RUQ ultrasound to evaluate his liver parenchyma. Patient has not been taking thiamine or his multivitamin, but is taking folate and Vit B12. If patient is successful in quitting, we can hold off on / discontinue folate, thiamine, and his multivitamin.    -- Advised cessation   ADDENDUM: AST has normalized.

## 2019-11-05 NOTE — Patient Instructions (Signed)
Brady Church,  It was a pleasure to see you today. I think setting a goal to stop drinking by January 1st is an excellent idea. Please let me know if there is anything we can do to help you be successful. I am rechecking your liver function today and have ordered an ultrasound of your liver. You will be called to schedule this.   Please follow up with me in 3 months or sooner if you have any problems. If you have any questions or concerns, call our clinic at [phone] or after hours call [phone] and ask for the internal medicine resident on call. Thank you!  Dr. Philipp Ovens

## 2019-11-06 LAB — CMP14 + ANION GAP
ALT: 29 IU/L (ref 0–44)
AST: 30 IU/L (ref 0–40)
Albumin/Globulin Ratio: 1.6 (ref 1.2–2.2)
Albumin: 4.1 g/dL (ref 3.8–4.9)
Alkaline Phosphatase: 59 IU/L (ref 39–117)
Anion Gap: 15 mmol/L (ref 10.0–18.0)
BUN/Creatinine Ratio: 14 (ref 9–20)
BUN: 10 mg/dL (ref 6–24)
Bilirubin Total: 0.3 mg/dL (ref 0.0–1.2)
CO2: 24 mmol/L (ref 20–29)
Calcium: 9.2 mg/dL (ref 8.7–10.2)
Chloride: 101 mmol/L (ref 96–106)
Creatinine, Ser: 0.74 mg/dL — ABNORMAL LOW (ref 0.76–1.27)
GFR calc Af Amer: 119 mL/min/{1.73_m2} (ref >59)
GFR calc non Af Amer: 103 mL/min/{1.73_m2} (ref >59)
Globulin, Total: 2.5 g/dL (ref 1.5–4.5)
Glucose: 102 mg/dL — ABNORMAL HIGH (ref 65–99)
Potassium: 4.2 mmol/L (ref 3.5–5.2)
Sodium: 140 mmol/L (ref 134–144)
Total Protein: 6.6 g/dL (ref 6.0–8.5)

## 2019-11-06 LAB — LIPID PANEL
Chol/HDL Ratio: 3.4 ratio (ref 0.0–5.0)
Cholesterol, Total: 168 mg/dL (ref 100–199)
HDL: 49 mg/dL (ref >39)
LDL Chol Calc (NIH): 103 mg/dL — ABNORMAL HIGH (ref 0–99)
Triglycerides: 87 mg/dL (ref 0–149)
VLDL Cholesterol Cal: 16 mg/dL (ref 5–40)

## 2019-11-09 NOTE — Progress Notes (Signed)
Subjective: Brady Church is a 58 y.o. y.o. male who presents for preventative diabetic foot care on  today with chronic callosities and painful mycotic toenails which interfere with daily activities. Pain is aggravated when wearing enclosed shoe gear and relieved with periodic professional debridement.  [name], MD is her PCP.   Medications reviewed in chart.  No Known Allergies  Objective: Vascular Examination: Capillary refill time to digits immediate  b/l.  Dorsalis pedis pulses palpable b/l.  Posterior tibial pulses palpable b/l.  Digital hair sparse b/l.  Skin temperature gradient WNL b/l.  Dermatological Examination: Skin with normal turgor, texture and tone b/l.  Toenails 1-5 b/l discolored, thick, dystrophic with subungual debris and pain with palpation to nailbeds due to thickness of nails.  Hyperkeratotic lesion submet head 1 b/l and b/l hallux with tenderness to palpation. No edema, no erythema, no drainage, no flocculence.  Musculoskeletal: Muscle strength 5/5 to all LE muscle groups b/l.  Neurological: Sensation diminished with 10 gram monofilament b/l.  Assessment: 1. Painful onychomycosis toenails 1-5 b/l 2.  Calluses submet head 1 b/l and b/l hallux 3.  NIDDM with neuropathy  Plan: 1. Continue diabetic foot care principles. Literature dispensed on today. 2. Toenails 1-5 b/l were debrided in length and girth without iatrogenic bleeding. 3. AS a courtesy and to prevent ulceration, calluses pared submetatarsal head 1 b/l and b/l hallux utilizing sterile scalpel blade without incident.  4. Patient to continue soft, supportive shoe gear daily. 5. Patient to report any pedal injuries to medical professional immediately. 6. Follow up 9 weeks. 7. Patient/POA to call should there be a concern in the interim.

## 2019-11-24 ENCOUNTER — Ambulatory Visit (HOSPITAL_COMMUNITY): Payer: Medicaid Other

## 2019-11-24 DIAGNOSIS — E119 Type 2 diabetes mellitus without complications: Secondary | ICD-10-CM | POA: Diagnosis not present

## 2019-11-24 DIAGNOSIS — H04123 Dry eye syndrome of bilateral lacrimal glands: Secondary | ICD-10-CM | POA: Diagnosis not present

## 2019-11-24 DIAGNOSIS — Z961 Presence of intraocular lens: Secondary | ICD-10-CM | POA: Diagnosis not present

## 2019-11-24 DIAGNOSIS — H401133 Primary open-angle glaucoma, bilateral, severe stage: Secondary | ICD-10-CM | POA: Diagnosis not present

## 2019-11-24 LAB — HM DIABETES EYE EXAM

## 2019-11-26 ENCOUNTER — Other Ambulatory Visit: Payer: Self-pay | Admitting: Internal Medicine

## 2019-11-26 DIAGNOSIS — N529 Male erectile dysfunction, unspecified: Secondary | ICD-10-CM

## 2019-12-01 ENCOUNTER — Encounter: Payer: Self-pay | Admitting: *Deleted

## 2020-01-21 ENCOUNTER — Ambulatory Visit (INDEPENDENT_AMBULATORY_CARE_PROVIDER_SITE_OTHER): Payer: Medicaid Other | Admitting: Podiatry

## 2020-01-21 ENCOUNTER — Other Ambulatory Visit: Payer: Self-pay

## 2020-01-21 VITALS — Temp 97.0°F

## 2020-01-21 DIAGNOSIS — M79676 Pain in unspecified toe(s): Secondary | ICD-10-CM | POA: Diagnosis not present

## 2020-01-21 DIAGNOSIS — B351 Tinea unguium: Secondary | ICD-10-CM

## 2020-01-21 DIAGNOSIS — E1142 Type 2 diabetes mellitus with diabetic polyneuropathy: Secondary | ICD-10-CM

## 2020-01-21 NOTE — Patient Instructions (Signed)
Diabetes Mellitus and Foot Care Foot care is an important part of your health, especially when you have diabetes. Diabetes may cause you to have problems because of poor blood flow (circulation) to your feet and legs, which can cause your skin to:  Become thinner and drier.  Break more easily.  Heal more slowly.  Peel and crack. You may also have nerve damage (neuropathy) in your legs and feet, causing decreased feeling in them. This means that you may not notice minor injuries to your feet that could lead to more serious problems. Noticing and addressing any potential problems early is the best way to prevent future foot problems. How to care for your feet Foot hygiene  Wash your feet daily with warm water and mild soap. Do not use hot water. Then, pat your feet and the areas between your toes until they are completely dry. Do not soak your feet as this can dry your skin.  Trim your toenails straight across. Do not dig under them or around the cuticle. File the edges of your nails with an emery board or nail file.  Apply a moisturizing lotion or petroleum jelly to the skin on your feet and to dry, brittle toenails. Use lotion that does not contain alcohol and is unscented. Do not apply lotion between your toes. Shoes and socks  Wear clean socks or stockings every day. Make sure they are not too tight. Do not wear knee-high stockings since they may decrease blood flow to your legs.  Wear shoes that fit properly and have enough cushioning. Always look in your shoes before you put them on to be sure there are no objects inside.  To break in new shoes, wear them for just a few hours a day. This prevents injuries on your feet. Wounds, scrapes, corns, and calluses  Check your feet daily for blisters, cuts, bruises, sores, and redness. If you cannot see the bottom of your feet, use a mirror or ask someone for help.  Do not cut corns or calluses or try to remove them with medicine.  If you  find a minor scrape, cut, or break in the skin on your feet, keep it and the skin around it clean and dry. You may clean these areas with mild soap and water. Do not clean the area with peroxide, alcohol, or iodine.  If you have a wound, scrape, corn, or callus on your foot, look at it several times a day to make sure it is healing and not infected. Check for: ? Redness, swelling, or pain. ? Fluid or blood. ? Warmth. ? Pus or a bad smell. General instructions  Do not cross your legs. This may decrease blood flow to your feet.  Do not use heating pads or hot water bottles on your feet. They may burn your skin. If you have lost feeling in your feet or legs, you may not know this is happening until it is too late.  Protect your feet from hot and cold by wearing shoes, such as at the beach or on hot pavement.  Schedule a complete foot exam at least once a year (annually) or more often if you have foot problems. If you have foot problems, report any cuts, sores, or bruises to your health care provider immediately. Contact a health care provider if:  You have a medical condition that increases your risk of infection and you have any cuts, sores, or bruises on your feet.  You have an injury that is not   healing.  You have redness on your legs or feet.  You feel burning or tingling in your legs or feet.  You have pain or cramps in your legs and feet.  Your legs or feet are numb.  Your feet always feel cold.  You have pain around a toenail. Get help right away if:  You have a wound, scrape, corn, or callus on your foot and: ? You have pain, swelling, or redness that gets worse. ? You have fluid or blood coming from the wound, scrape, corn, or callus. ? Your wound, scrape, corn, or callus feels warm to the touch. ? You have pus or a bad smell coming from the wound, scrape, corn, or callus. ? You have a fever. ? You have a red line going up your leg. Summary  Check your feet every day  for cuts, sores, red spots, swelling, and blisters.  Moisturize feet and legs daily.  Wear shoes that fit properly and have enough cushioning.  If you have foot problems, report any cuts, sores, or bruises to your health care provider immediately.  Schedule a complete foot exam at least once a year (annually) or more often if you have foot problems. This information is not intended to replace advice given to you by your health care provider. Make sure you discuss any questions you have with your health care provider. Document Revised: 08/06/2019 Document Reviewed: 12/15/2016 Elsevier Patient Education  2020 Elsevier Inc.  

## 2020-01-22 ENCOUNTER — Encounter: Payer: Self-pay | Admitting: Podiatry

## 2020-01-22 NOTE — Progress Notes (Signed)
Subjective: Brady Church presents today for follow up of preventative diabetic foot care and painful mycotic nails b/l that are difficult to trim. Pain interferes with ambulation. Aggravating factors include wearing enclosed shoe gear. Pain is relieved with periodic professional debridement.   He voices no new pedal concerns on today's visit.  No Known Allergies   Objective: Vitals:   01/21/20 0831  Temp: (!) 97 F (36.1 C)    Vascular Examination:  Capillary refill time to digits immediate b/l, palpable DP pulses b/l, palpable PT pulses b/l, pedal hair sparse b/l and skin temperature gradient within normal limits b/l  Dermatological Examination: Pedal skin with normal turgor, texture and tone bilaterally, no open wounds bilaterally, no interdigital macerations bilaterally, toenails 1-5 b/l elongated, dystrophic, thickened, crumbly with subungual debris and hyperkeratotic lesion(s) submet head 1 b/l and b/l hallux IPJ.  No erythema, no edema, no drainage, no flocculence  Musculoskeletal: Normal muscle strength 5/5 to all lower extremity muscle groups bilaterally, no pain crepitus or joint limitation noted with ROM b/l and bunion deformity noted b/l  Neurological: Protective sensation absent with 10g monofilament b/l and vibratory sensation decreased b/l  Assessment: 1. Pain due to onychomycosis of toenail   2. Diabetic peripheral neuropathy associated with type 2 diabetes mellitus (HCC)    Plan: -Brady Church refused callus debridement on today's visit. Medicaid ABN signed for 2021. Copy given to patient on today's visit and copy placed in patient chart. He is vision impaired. Signature witnessed by Jacklynn Bue, CMA. -Toenails 1-5 b/l were debrided in length and girth with sterile nail nippers and dremel without iatrogenic bleeding. -Patient to continue soft, supportive shoe gear daily. -Patient to report any pedal injuries to medical professional immediately. -Patient/POA  to call should there be question/concern in the interim.  Return in about 9 weeks (around 03/24/2020) for diabetic nail and callus trim.

## 2020-02-11 ENCOUNTER — Ambulatory Visit (INDEPENDENT_AMBULATORY_CARE_PROVIDER_SITE_OTHER): Payer: Medicaid Other | Admitting: Internal Medicine

## 2020-02-11 ENCOUNTER — Encounter: Payer: Self-pay | Admitting: Internal Medicine

## 2020-02-11 ENCOUNTER — Other Ambulatory Visit: Payer: Self-pay

## 2020-02-11 VITALS — BP 127/75 | HR 72 | Temp 97.9°F | Ht 68.0 in | Wt 231.3 lb

## 2020-02-11 DIAGNOSIS — E1136 Type 2 diabetes mellitus with diabetic cataract: Secondary | ICD-10-CM

## 2020-02-11 DIAGNOSIS — E785 Hyperlipidemia, unspecified: Secondary | ICD-10-CM

## 2020-02-11 DIAGNOSIS — F1010 Alcohol abuse, uncomplicated: Secondary | ICD-10-CM

## 2020-02-11 DIAGNOSIS — I10 Essential (primary) hypertension: Secondary | ICD-10-CM

## 2020-02-11 DIAGNOSIS — H269 Unspecified cataract: Secondary | ICD-10-CM | POA: Diagnosis not present

## 2020-02-11 DIAGNOSIS — Z87891 Personal history of nicotine dependence: Secondary | ICD-10-CM | POA: Diagnosis not present

## 2020-02-11 DIAGNOSIS — Z79899 Other long term (current) drug therapy: Secondary | ICD-10-CM | POA: Diagnosis not present

## 2020-02-11 LAB — POCT GLYCOSYLATED HEMOGLOBIN (HGB A1C): Hemoglobin A1C: 6.6 % — AB (ref 4.0–5.6)

## 2020-02-11 LAB — GLUCOSE, CAPILLARY: Glucose-Capillary: 155 mg/dL — ABNORMAL HIGH (ref 70–99)

## 2020-02-11 NOTE — Assessment & Plan Note (Addendum)
LDL was slightly above goal at his last check despite taking atorvastatin 80 mg daily.  Repeating lipid panel today.  Will consider adding Zetia if still above goal.  ADDENDUM: LDL improved, now < 100. Continue current therapy.

## 2020-02-11 NOTE — Patient Instructions (Signed)
Mr. Sabino,  It was a pleasure to see you today. Please continue taking all of your medicine as previously prescribed. Follow up with me again in 6 months.   If you have any questions or concerns, call our clinic at [phone] or after hours call [phone] and ask for the internal medicine resident on call.   Thank you!  Dr. Antony Contras

## 2020-02-11 NOTE — Progress Notes (Signed)
Subjective:   Patient ID: Brady Church male   DOB: 05-20-1961 59 y.o.   MRN: [id number]  HPI: Mr.Brady Church is a 59 y.o. male with past medical history outlined below here for DM and HTN follow up. For the details of today's visit, please refer to the assessment and plan.   Past Medical History:  Diagnosis Date  . 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 Diabetes mellitus   . High cholesterol   . Hypertension   . Substance abuse ([facility])    cocaine. Stopped in 2010  . Tobacco use disorder 10/07/2014   Current Outpatient Medications  Medication Sig Dispense Refill  . ALPHAGAN P 0.1 % SOLN INSTILL 1 DROP INTO EACH EYE TWICE DAILY  3  . aspirin 325 MG EC tablet Take 1 tablet (325 mg total) by mouth daily. 100 tablet 0  . atorvastatin (LIPITOR) 80 MG tablet TAKE 1 TABLET BY MOUTH ONCE DAILY AT  6  PM 90 tablet 0  . Cyanocobalamin (VITAMIN B 12 PO) Take by mouth.    . dorzolamide-timolol (COSOPT) 22.3-6.8 MG/ML ophthalmic solution Place 1 drop into the right eye 2 (two) times daily. 10 mL 3  . fluorometholone (FML) 0.1 % ophthalmic suspension INSTILL 1 DROP INTO EACH EYE ONCE DAILY  0  . folic acid (FOLVITE) 1 MG tablet Take 1 tablet (1 mg total) by mouth daily. 30 tablet 5  . hydrochlorothiazide (HYDRODIURIL) 25 MG tablet Take 1 tablet by mouth once daily 90 tablet 0  . latanoprost (XALATAN) 0.005 % ophthalmic solution INSTILL 1 DROP INTO EACH EYE AT BEDTIME    . lisinopril (ZESTRIL) 40 MG tablet Take 1 tablet by mouth once daily 90 tablet 0  . Multiple Vitamin (MULTIVITAMIN WITH MINERALS) TABS tablet Take 1 tablet by mouth daily. 30 tablet 5  . sildenafil (REVATIO) 20 MG tablet TAKE 2 TO 5 TABLETS BY MOUTH AS NEEDED 30  MINUTES  PRIOR  TO  SEXUAL  ACTIVIY 5 tablet 0  . thiamine (VITAMIN B-1) 100 MG tablet Take 2.5 tablets (250 mg total) by mouth daily. 30 tablet 5   No current facility-administered medications for this visit.   Family History  Problem  Relation Age of Onset  . Coronary artery disease Mother 37       died of MI  . Cancer Father 21       Some GI cancer. Not sure if it was Colon Cancer or not.  . Hypertension Father   . Diabetes Maternal Grandmother    Social History   Socioeconomic History  . Marital status: Single    Spouse name: Not on file  . Number of children: Not on file  . Years of education: 37  . Highest education level: Not on file  Occupational History  . Not on file  Tobacco Use  . Smoking status: Former Smoker    Packs/day: 0.50    Types: Cigarettes    Quit date: 02/18/2017    Years since quitting: 2.9  . Smokeless tobacco: Never Used  . Tobacco comment: Quit over 1 year ago.  Substance and Sexual Activity  . Alcohol use: Yes    Alcohol/week: 0.0 standard drinks    Comment: Beer- 40 oz. every day.  . Drug use: No    Types: Cocaine    Comment: Previous cocaine user. Stopped 2 years before.  Marland Kitchen Sexual activity: Not on file  Other Topics Concern  . Not on file  Social History Narrative   Lives in [place] for last 10 years with "friend"  [name]      Is not married and does not have kids.   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   Social Determinants of Health   Financial Resource Strain:   . Difficulty of Paying Living Expenses:   Food Insecurity:   . Worried About Programme researcher, broadcasting/film/video in the Last Year:   . Barista in the Last Year:   Transportation Needs:   . Freight forwarder (Medical):   Marland Kitchen Lack of Transportation (Non-Medical):   Physical Activity:   . Days of Exercise per Week:   . Minutes of Exercise per Session:   Stress:   . Feeling of Stress :   Social Connections:   . Frequency of Communication with Friends and Family:   . Frequency of Social Gatherings with Friends and Family:   . Attends Religious Services:   . Active Member of Clubs or Organizations:   . Attends Tax inspector Meetings:   Marland Kitchen Marital Status:     Review of Systems: Review of Systems  Respiratory: Negative for shortness of breath.   Cardiovascular: Negative for chest pain.     Objective:  Physical Exam:  Vitals:   02/11/20 0945  BP: 127/75  Pulse: 72  Temp: 97.9 F (36.6 C)  TempSrc: Oral  SpO2: 97%  Weight: 231 lb 4.8 oz (104.9 kg)  Height: 5\' 8"  (1.727 m)    Physical Exam  Constitutional: He is oriented to person, place, and time and well-developed, well-nourished, and in no distress. No distress.  Cardiovascular: Normal rate, regular rhythm and normal heart sounds.  Pulmonary/Chest: Effort normal and breath sounds normal.  Neurological: He is alert and oriented to person, place, and time.  Skin: He is not diaphoretic.  Psychiatric: Mood and affect normal.     Assessment & Plan:   See Encounters Tab for problem based charting.

## 2020-02-11 NOTE — Assessment & Plan Note (Signed)
Well-controlled today, 127/75.  Continue lisinopril 40 mg daily and hydrochlorothiazide 25 mg daily.  Follow-up CMP.

## 2020-02-11 NOTE — Assessment & Plan Note (Signed)
Currently diet controlled.  Hemoglobin A1c today is 6.6.  We discussed that if his hemoglobin A1c continues to uptrend we may need to consider restarting Metformin therapy.  He will work on diet and lifestyle changes for now.  Follow-up in 6 months.

## 2020-02-11 NOTE — Assessment & Plan Note (Addendum)
Patient continues to drink one 40 ounce beer daily.  LFTs on last check had normalized.  Right upper quadrant ultrasound was ordered at his last visit, unfortunately patient missed his appointment.  He has the number and will call to reschedule this.  Given ongoing alcohol use, will recheck CMP today.  Again we discussed importance of cessation.  Offered naltrexone therapy, patient declined.  We will continue to monitor.  ADDENDUM: CMP within normal limits. Attempted to call patient with results, no answer. Unable to leave VM.

## 2020-02-12 LAB — CMP14 + ANION GAP
ALT: 28 IU/L (ref 0–44)
AST: 34 IU/L (ref 0–40)
Albumin/Globulin Ratio: 1.6 (ref 1.2–2.2)
Albumin: 4.1 g/dL (ref 3.8–4.9)
Alkaline Phosphatase: 54 IU/L (ref 39–117)
Anion Gap: 15 mmol/L (ref 10.0–18.0)
BUN/Creatinine Ratio: 14 (ref 9–20)
BUN: 13 mg/dL (ref 6–24)
Bilirubin Total: 0.4 mg/dL (ref 0.0–1.2)
CO2: 23 mmol/L (ref 20–29)
Calcium: 9.1 mg/dL (ref 8.7–10.2)
Chloride: 98 mmol/L (ref 96–106)
Creatinine, Ser: 0.95 mg/dL (ref 0.76–1.27)
GFR calc Af Amer: 102 mL/min/{1.73_m2} (ref >59)
GFR calc non Af Amer: 88 mL/min/{1.73_m2} (ref >59)
Globulin, Total: 2.5 g/dL (ref 1.5–4.5)
Glucose: 126 mg/dL — ABNORMAL HIGH (ref 65–99)
Potassium: 3.7 mmol/L (ref 3.5–5.2)
Sodium: 136 mmol/L (ref 134–144)
Total Protein: 6.6 g/dL (ref 6.0–8.5)

## 2020-02-12 LAB — LIPID PANEL
Chol/HDL Ratio: 2.9 ratio (ref 0.0–5.0)
Cholesterol, Total: 184 mg/dL (ref 100–199)
HDL: 64 mg/dL (ref >39)
LDL Chol Calc (NIH): 96 mg/dL (ref 0–99)
Triglycerides: 137 mg/dL (ref 0–149)
VLDL Cholesterol Cal: 24 mg/dL (ref 5–40)

## 2020-02-27 ENCOUNTER — Other Ambulatory Visit: Payer: Self-pay | Admitting: Student in an Organized Health Care Education/Training Program

## 2020-02-27 ENCOUNTER — Other Ambulatory Visit: Payer: Self-pay | Admitting: Internal Medicine

## 2020-02-27 DIAGNOSIS — E785 Hyperlipidemia, unspecified: Secondary | ICD-10-CM

## 2020-02-27 DIAGNOSIS — N529 Male erectile dysfunction, unspecified: Secondary | ICD-10-CM

## 2020-03-12 DIAGNOSIS — Z20828 Contact with and (suspected) exposure to other viral communicable diseases: Secondary | ICD-10-CM | POA: Diagnosis not present

## 2020-03-23 DIAGNOSIS — H401133 Primary open-angle glaucoma, bilateral, severe stage: Secondary | ICD-10-CM | POA: Diagnosis not present

## 2020-03-23 DIAGNOSIS — E119 Type 2 diabetes mellitus without complications: Secondary | ICD-10-CM | POA: Diagnosis not present

## 2020-03-23 DIAGNOSIS — H04123 Dry eye syndrome of bilateral lacrimal glands: Secondary | ICD-10-CM | POA: Diagnosis not present

## 2020-03-23 DIAGNOSIS — Z961 Presence of intraocular lens: Secondary | ICD-10-CM | POA: Diagnosis not present

## 2020-03-30 ENCOUNTER — Ambulatory Visit: Payer: Medicaid Other | Admitting: Podiatry

## 2020-04-09 ENCOUNTER — Encounter: Payer: Self-pay | Admitting: *Deleted

## 2020-04-09 ENCOUNTER — Telehealth: Payer: Self-pay | Admitting: *Deleted

## 2020-04-09 NOTE — Congregational Nurse Program (Signed)
  Dept: [phone]   Congregational Nurse Program Note  Date of Encounter: 04/09/2020  Past Medical History: Past Medical History:  Diagnosis Date  . 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 Diabetes mellitus   . High cholesterol   . Hypertension   . Substance abuse (HCC)    cocaine. Stopped in 2010  . Tobacco use disorder 10/07/2014    Encounter Details: CNP Questionnaire - 04/09/20 0943      Questionnaire   Race  Black or African American    Location Patient Served At  [employer]    Uninsured  Not Applicable    Food  No food insecurities    Housing/Utilities  No permanent housing    Transportation  No transportation needs    Interpersonal Safety  No, do not feel physically and emotionally safe where you currently live    Medication  No medication insecurities    Medical Provider  Yes    Referrals  Not Applicable    ED Visit Averted  Not Applicable    Life-Saving Intervention Made  Not Applicable      Client seen at [facility]. He requested to have a CBG 197 and vitals 137/97 pulse 93. Client assisted making appointments for follow ups with his established doctors. Vanessa Kick [facility]  [phone]

## 2020-04-09 NOTE — Telephone Encounter (Signed)
Called [facility]. Client has an appointment May 28th at 1:15.

## 2020-04-09 NOTE — Telephone Encounter (Signed)
Made appointment with Dr. [name] June 16th 8:45.

## 2020-04-15 DIAGNOSIS — Z03818 Encounter for observation for suspected exposure to other biological agents ruled out: Secondary | ICD-10-CM | POA: Diagnosis not present

## 2020-04-21 ENCOUNTER — Other Ambulatory Visit: Payer: Self-pay | Admitting: Internal Medicine

## 2020-04-21 DIAGNOSIS — Z0389 Encounter for observation for other suspected diseases and conditions ruled out: Secondary | ICD-10-CM | POA: Diagnosis not present

## 2020-04-21 DIAGNOSIS — I10 Essential (primary) hypertension: Secondary | ICD-10-CM

## 2020-04-21 DIAGNOSIS — Z1388 Encounter for screening for disorder due to exposure to contaminants: Secondary | ICD-10-CM | POA: Diagnosis not present

## 2020-04-21 DIAGNOSIS — Z3009 Encounter for other general counseling and advice on contraception: Secondary | ICD-10-CM | POA: Diagnosis not present

## 2020-04-23 ENCOUNTER — Ambulatory Visit (INDEPENDENT_AMBULATORY_CARE_PROVIDER_SITE_OTHER): Payer: Medicaid Other | Admitting: Podiatry

## 2020-04-23 DIAGNOSIS — Z5329 Procedure and treatment not carried out because of patient's decision for other reasons: Secondary | ICD-10-CM

## 2020-04-23 NOTE — Progress Notes (Signed)
No show for appt. 

## 2020-04-26 DIAGNOSIS — Z03818 Encounter for observation for suspected exposure to other biological agents ruled out: Secondary | ICD-10-CM | POA: Diagnosis not present

## 2020-05-04 ENCOUNTER — Encounter: Payer: Self-pay | Admitting: Podiatry

## 2020-05-04 ENCOUNTER — Other Ambulatory Visit: Payer: Self-pay

## 2020-05-04 ENCOUNTER — Ambulatory Visit (INDEPENDENT_AMBULATORY_CARE_PROVIDER_SITE_OTHER): Payer: Medicaid Other | Admitting: Podiatry

## 2020-05-04 DIAGNOSIS — M79676 Pain in unspecified toe(s): Secondary | ICD-10-CM

## 2020-05-04 DIAGNOSIS — B351 Tinea unguium: Secondary | ICD-10-CM | POA: Diagnosis not present

## 2020-05-04 DIAGNOSIS — E1142 Type 2 diabetes mellitus with diabetic polyneuropathy: Secondary | ICD-10-CM

## 2020-05-04 NOTE — Patient Instructions (Signed)
Diabetes Mellitus and Foot Care Foot care is an important part of your health, especially when you have diabetes. Diabetes may cause you to have problems because of poor blood flow (circulation) to your feet and legs, which can cause your skin to:  Become thinner and drier.  Break more easily.  Heal more slowly.  Peel and crack. You may also have nerve damage (neuropathy) in your legs and feet, causing decreased feeling in them. This means that you may not notice minor injuries to your feet that could lead to more serious problems. Noticing and addressing any potential problems early is the best way to prevent future foot problems. How to care for your feet Foot hygiene  Wash your feet daily with warm water and mild soap. Do not use hot water. Then, pat your feet and the areas between your toes until they are completely dry. Do not soak your feet as this can dry your skin.  Trim your toenails straight across. Do not dig under them or around the cuticle. File the edges of your nails with an emery board or nail file.  Apply a moisturizing lotion or petroleum jelly to the skin on your feet and to dry, brittle toenails. Use lotion that does not contain alcohol and is unscented. Do not apply lotion between your toes. Shoes and socks  Wear clean socks or stockings every day. Make sure they are not too tight. Do not wear knee-high stockings since they may decrease blood flow to your legs.  Wear shoes that fit properly and have enough cushioning. Always look in your shoes before you put them on to be sure there are no objects inside.  To break in new shoes, wear them for just a few hours a day. This prevents injuries on your feet. Wounds, scrapes, corns, and calluses  Check your feet daily for blisters, cuts, bruises, sores, and redness. If you cannot see the bottom of your feet, use a mirror or ask someone for help.  Do not cut corns or calluses or try to remove them with medicine.  If you  find a minor scrape, cut, or break in the skin on your feet, keep it and the skin around it clean and dry. You may clean these areas with mild soap and water. Do not clean the area with peroxide, alcohol, or iodine.  If you have a wound, scrape, corn, or callus on your foot, look at it several times a day to make sure it is healing and not infected. Check for: ? Redness, swelling, or pain. ? Fluid or blood. ? Warmth. ? Pus or a bad smell. General instructions  Do not cross your legs. This may decrease blood flow to your feet.  Do not use heating pads or hot water bottles on your feet. They may burn your skin. If you have lost feeling in your feet or legs, you may not know this is happening until it is too late.  Protect your feet from hot and cold by wearing shoes, such as at the beach or on hot pavement.  Schedule a complete foot exam at least once a year (annually) or more often if you have foot problems. If you have foot problems, report any cuts, sores, or bruises to your health care provider immediately. Contact a health care provider if:  You have a medical condition that increases your risk of infection and you have any cuts, sores, or bruises on your feet.  You have an injury that is not   healing.  You have redness on your legs or feet.  You feel burning or tingling in your legs or feet.  You have pain or cramps in your legs and feet.  Your legs or feet are numb.  Your feet always feel cold.  You have pain around a toenail. Get help right away if:  You have a wound, scrape, corn, or callus on your foot and: ? You have pain, swelling, or redness that gets worse. ? You have fluid or blood coming from the wound, scrape, corn, or callus. ? Your wound, scrape, corn, or callus feels warm to the touch. ? You have pus or a bad smell coming from the wound, scrape, corn, or callus. ? You have a fever. ? You have a red line going up your leg. Summary  Check your feet every day  for cuts, sores, red spots, swelling, and blisters.  Moisturize feet and legs daily.  Wear shoes that fit properly and have enough cushioning.  If you have foot problems, report any cuts, sores, or bruises to your health care provider immediately.  Schedule a complete foot exam at least once a year (annually) or more often if you have foot problems. This information is not intended to replace advice given to you by your health care provider. Make sure you discuss any questions you have with your health care provider. Document Revised: 08/06/2019 Document Reviewed: 12/15/2016 Elsevier Patient Education  2020 Elsevier Inc.  

## 2020-05-07 NOTE — Progress Notes (Signed)
Subjective: Brady Church presents today preventative diabetic foot care and painful mycotic nails b/l that are difficult to trim. Pain interferes with ambulation. Aggravating factors include wearing enclosed shoe gear. Pain is relieved with periodic professional debridement.   He voices no new pedal problems on today's visit.  Brady Ochs, MD is patient's PCP. Last visit was: 02/11/2020.  Past Medical History:  Diagnosis Date  . 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 Diabetes mellitus   . High cholesterol   . Hypertension   . Substance abuse ([place])    cocaine. Stopped in 2010  . Tobacco use disorder 10/07/2014     Current Outpatient Medications on File Prior to Visit  Medication Sig Dispense Refill  . ALPHAGAN P 0.1 % SOLN INSTILL 1 DROP INTO EACH EYE TWICE DAILY  3  . aspirin 325 MG EC tablet Take 1 tablet (325 mg total) by mouth daily. 100 tablet 0  . atorvastatin (LIPITOR) 80 MG tablet TAKE 1 TABLET BY MOUTH ONCE DAILY AT  6  PM 90 tablet 3  . Cyanocobalamin (VITAMIN B 12 PO) Take by mouth.    . dorzolamide-timolol (COSOPT) 22.3-6.8 MG/ML ophthalmic solution Place 1 drop into the right eye 2 (two) times daily. 10 mL 3  . fluorometholone (FML) 0.1 % ophthalmic suspension INSTILL 1 DROP INTO EACH EYE ONCE DAILY  0  . folic acid (FOLVITE) 1 MG tablet Take 1 tablet (1 mg total) by mouth daily. 30 tablet 5  . hydrochlorothiazide (HYDRODIURIL) 25 MG tablet Take 1 tablet by mouth once daily 90 tablet 0  . latanoprost (XALATAN) 0.005 % ophthalmic solution INSTILL 1 DROP INTO EACH EYE AT BEDTIME    . lisinopril (ZESTRIL) 40 MG tablet Take 1 tablet by mouth once daily 90 tablet 0  . Multiple Vitamin (MULTIVITAMIN WITH MINERALS) TABS tablet Take 1 tablet by mouth daily. 30 tablet 5  . sildenafil (REVATIO) 20 MG tablet TAKE 2 TO 5 TABLETS BY MOUTH AS NEEDED 30 MINUTES PRIOR TO SEXUAL ACTIVITY 5 tablet 0  . thiamine (VITAMIN B-1) 100 MG tablet Take 2.5 tablets  (250 mg total) by mouth daily. 30 tablet 5   No current facility-administered medications on file prior to visit.     No Known Allergies  Objective: Brady Church is a pleasant 59 y.o. y.o. Patient Race: Black or African American [2]  male in NAD. AAO x 3.  There were no vitals filed for this visit.  Vascular Examination: Neurovascular status unchanged b/l lower extremities. Capillary refill time to digits immediate b/l. Palpable DP pulses b/l. Palpable PT pulses b/l. Pedal hair sparse b/l. Skin temperature gradient within normal limits b/l. No pain with calf compression b/l.  Dermatological Examination: Pedal skin with normal turgor, texture and tone bilaterally. No open wounds bilaterally. No interdigital macerations bilaterally. Toenails 1-5 b/l elongated, discolored, dystrophic, thickened, crumbly with subungual debris and tenderness to dorsal palpation. Minimal hyperkeratosis b/l hallux and submet head 1 b/l.  Musculoskeletal: Normal muscle strength 5/5 to all lower extremity muscle groups bilaterally. No pain crepitus or joint limitation noted with ROM b/l. Hallux valgus with bunion deformity noted b/l lower extremities.  Neurological Examination: Protective sensation diminished with 10g monofilament b/l. Vibratory sensation decreased b/l. Proprioception intact bilaterally.  Assessment: 1. Pain due to onychomycosis of toenail   2. Diabetic peripheral neuropathy associated with type 2 diabetes mellitus ([place])   Plan: -Examined patient. -Medicaid ABN has been signed for 2021.  -Continue diabetic  foot care principles. -Toenails 1-5 b/l were debrided in length and girth with sterile nail nippers and dremel without iatrogenic bleeding.  -Patient to report any pedal injuries to medical professional immediately. -Patient to continue soft, supportive shoe gear daily. -Patient/POA to call should there be question/concern in the interim.  Return in about 3 months (around 08/04/2020)  for diabetic nail trim.  Brady Church, DPM

## 2020-05-10 DIAGNOSIS — Z03818 Encounter for observation for suspected exposure to other biological agents ruled out: Secondary | ICD-10-CM | POA: Diagnosis not present

## 2020-05-12 ENCOUNTER — Ambulatory Visit (INDEPENDENT_AMBULATORY_CARE_PROVIDER_SITE_OTHER): Payer: Medicaid Other | Admitting: Internal Medicine

## 2020-05-12 ENCOUNTER — Encounter: Payer: Self-pay | Admitting: Internal Medicine

## 2020-05-12 VITALS — BP 155/101 | HR 94 | Temp 98.1°F | Ht 68.0 in | Wt 223.2 lb

## 2020-05-12 DIAGNOSIS — I10 Essential (primary) hypertension: Secondary | ICD-10-CM | POA: Diagnosis not present

## 2020-05-12 DIAGNOSIS — N529 Male erectile dysfunction, unspecified: Secondary | ICD-10-CM | POA: Diagnosis not present

## 2020-05-12 DIAGNOSIS — F1010 Alcohol abuse, uncomplicated: Secondary | ICD-10-CM | POA: Diagnosis not present

## 2020-05-12 DIAGNOSIS — G6289 Other specified polyneuropathies: Secondary | ICD-10-CM | POA: Diagnosis not present

## 2020-05-12 DIAGNOSIS — E785 Hyperlipidemia, unspecified: Secondary | ICD-10-CM | POA: Diagnosis not present

## 2020-05-12 DIAGNOSIS — E0836 Diabetes mellitus due to underlying condition with diabetic cataract: Secondary | ICD-10-CM

## 2020-05-12 DIAGNOSIS — E538 Deficiency of other specified B group vitamins: Secondary | ICD-10-CM

## 2020-05-12 LAB — GLUCOSE, CAPILLARY: Glucose-Capillary: 127 mg/dL — ABNORMAL HIGH (ref 70–99)

## 2020-05-12 LAB — POCT GLYCOSYLATED HEMOGLOBIN (HGB A1C): Hemoglobin A1C: 6.4 % — AB (ref 4.0–5.6)

## 2020-05-12 MED ORDER — HYDROCHLOROTHIAZIDE 25 MG PO TABS: 25.0000 mg | ORAL_TABLET | Freq: Every day | ORAL | 3 refills | Status: DC

## 2020-05-12 MED ORDER — ATORVASTATIN CALCIUM 80 MG PO TABS: ORAL_TABLET | ORAL | 3 refills | Status: DC

## 2020-05-12 MED ORDER — LISINOPRIL 40 MG PO TABS: 40.0000 mg | ORAL_TABLET | Freq: Every day | ORAL | 3 refills | Status: DC

## 2020-05-12 MED ORDER — SILDENAFIL CITRATE 20 MG PO TABS: ORAL_TABLET | ORAL | 1 refills | Status: DC

## 2020-05-12 MED ORDER — MULTIVITAMIN ADULTS PO TABS: 1.0000 | ORAL_TABLET | Freq: Every day | ORAL | 3 refills | Status: DC

## 2020-05-12 MED ORDER — GABAPENTIN 300 MG PO CAPS: 300.0000 mg | ORAL_CAPSULE | Freq: Three times a day (TID) | ORAL | 2 refills | Status: DC

## 2020-05-12 MED ORDER — ADULT MULTIVITAMIN W/MINERALS CH: 1.0000 | ORAL_TABLET | Freq: Every day | ORAL | 5 refills | Status: DC

## 2020-05-12 NOTE — Assessment & Plan Note (Signed)
Uncontrolled today but has not taken his medication in 2 days. Previously well controlled on HCTZ 25 mg and lisinopril 40 mg daily. Refills sent to pharmacy.

## 2020-05-12 NOTE — Assessment & Plan Note (Signed)
Refilled sildenafil PRN.  

## 2020-05-12 NOTE — Assessment & Plan Note (Addendum)
Refilled atorvastatin 80 mg daily. Secondary prevention in the setting of prior CVA.

## 2020-05-12 NOTE — Assessment & Plan Note (Addendum)
Not currently on supplementation. Recheck Vit B12 level today.   ADDENDUM: Vitamin B12 level is borderline. Will see if we can add on MMA.   ADDENDUM: MMA within normal limits, ruling out vitamin B12 deficiency. Will continue to monitor.

## 2020-05-12 NOTE — Patient Instructions (Signed)
Brady Church,  It was a pleasure to see you today. Please continue to take your medicine as previously prescribed. I have given you a prescription for a multivitamin to take in addition to your folic acid. I will also send in a prescription for vitamin B12 pending your lab results today.   Follow up with me again in 6 months.   If you have any questions or concerns, call our clinic at [phone] or after hours call [phone] and ask for the internal medicine resident on call. Thank you!  Dr. Antony Contras

## 2020-05-12 NOTE — Progress Notes (Signed)
Subjective:   Patient ID: Brady Church male   DOB: Jun 27, 1961 59 y.o.   MRN: [id number]  HPI: Mr.Brady Church is a 59 y.o. male with past medical history outlined below here for DM follow up. For the details of today's visit, please refer to the assessment and plan.   Past Medical History:  Diagnosis Date  . 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 Diabetes mellitus   . High cholesterol   . Hypertension   . Substance abuse ([place])    cocaine. Stopped in 2010  . Tobacco use disorder 10/07/2014   Current Outpatient Medications  Medication Sig Dispense Refill  . ALPHAGAN P 0.1 % SOLN INSTILL 1 DROP INTO EACH EYE TWICE DAILY  3  . aspirin 325 MG EC tablet Take 1 tablet (325 mg total) by mouth daily. 100 tablet 0  . atorvastatin (LIPITOR) 80 MG tablet TAKE 1 TABLET BY MOUTH ONCE DAILY AT  6  PM 90 tablet 3  . Cyanocobalamin (VITAMIN B 12 PO) Take by mouth.    . dorzolamide-timolol (COSOPT) 22.3-6.8 MG/ML ophthalmic solution Place 1 drop into the right eye 2 (two) times daily. 10 mL 3  . fluorometholone (FML) 0.1 % ophthalmic suspension INSTILL 1 DROP INTO EACH EYE ONCE DAILY  0  . folic acid (FOLVITE) 1 MG tablet Take 1 tablet (1 mg total) by mouth daily. 30 tablet 5  . gabapentin (NEURONTIN) 300 MG capsule Take 1 capsule (300 mg total) by mouth 3 (three) times daily. 90 capsule 2  . hydrochlorothiazide (HYDRODIURIL) 25 MG tablet Take 1 tablet (25 mg total) by mouth daily. 90 tablet 3  . latanoprost (XALATAN) 0.005 % ophthalmic solution INSTILL 1 DROP INTO EACH EYE AT BEDTIME    . lisinopril (ZESTRIL) 40 MG tablet Take 1 tablet (40 mg total) by mouth daily. 90 tablet 3  . Multiple Vitamin (MULTIVITAMIN WITH MINERALS) TABS tablet Take 1 tablet by mouth daily. 30 tablet 5  . Multiple Vitamins-Minerals (MULTIVITAMIN ADULTS) TABS Take 1 tablet by mouth daily. 90 tablet 3  . sildenafil (REVATIO) 20 MG tablet TAKE 2 TO 5 TABLETS BY MOUTH AS NEEDED 30 MINUTES PRIOR TO  SEXUAL ACTIVITY 20 tablet 1  . thiamine (VITAMIN B-1) 100 MG tablet Take 2.5 tablets (250 mg total) by mouth daily. 30 tablet 5   No current facility-administered medications for this visit.   Family History  Problem Relation Age of Onset  . Coronary artery disease Mother 49       died of MI  . Cancer Father 64       Some GI cancer. Not sure if it was Colon Cancer or not.  . Hypertension Father   . Diabetes Maternal Grandmother    Social History   Socioeconomic History  . Marital status: Single    Spouse name: Not on file  . Number of children: Not on file  . Years of education: 8  . Highest education level: Not on file  Occupational History  . Not on file  Tobacco Use  . Smoking status: Former Smoker    Packs/day: 0.50    Types: Cigarettes    Quit date: 02/18/2017    Years since quitting: 3.2  . Smokeless tobacco: Never Used  . Tobacco comment: Quit over 1 year ago.  Substance and Sexual Activity  . Alcohol use: Yes    Alcohol/week: 0.0 standard drinks    Comment: Beer- 40 oz. every day.  Marland Kitchen  Drug use: No    Types: Cocaine    Comment: Previous cocaine user. Stopped 2 years before.  Marland Kitchen Sexual activity: Not on file  Other Topics Concern  . Not on file  Social History Narrative   Lives in [place] for last 10 years with "friend"  [name]      Is not married and does not have kids.   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   Social Determinants of Health   Financial Resource Strain:   . Difficulty of Paying Living Expenses:   Food Insecurity:   . Worried About Programme researcher, broadcasting/film/video in the Last Year:   . Barista in the Last Year:   Transportation Needs:   . Freight forwarder (Medical):   Marland Kitchen Lack of Transportation (Non-Medical):   Physical Activity:   . Days of Exercise per Week:   . Minutes of Exercise per Session:   Stress:   . Feeling of Stress :   Social Connections:   .  Frequency of Communication with Friends and Family:   . Frequency of Social Gatherings with Friends and Family:   . Attends Religious Services:   . Active Member of Clubs or Organizations:   . Attends Banker Meetings:   Marland Kitchen Marital Status:     Review of Systems: Review of Systems  Respiratory: Negative for shortness of breath.   Cardiovascular: Negative for chest pain.  Neurological: Positive for tingling and sensory change.     Objective:  Physical Exam:  Vitals:   05/12/20 0940  BP: (!) 155/101  Pulse: 94  Temp: 98.1 F (36.7 C)  TempSrc: Oral  SpO2: 100%  Weight: 223 lb 3.2 oz (101.2 kg)  Height: 5\' 8"  (1.727 m)    Physical Exam Constitutional:      Appearance: Normal appearance.  Cardiovascular:     Rate and Rhythm: Normal rate and regular rhythm.     Heart sounds: Normal heart sounds.  Pulmonary:     Effort: Pulmonary effort is normal.     Breath sounds: Normal breath sounds.  Skin:    General: Skin is warm and dry.  Neurological:     General: No focal deficit present.     Mental Status: He is alert and oriented to person, place, and time.      Assessment & Plan:   See Encounters Tab for problem based charting.

## 2020-05-12 NOTE — Assessment & Plan Note (Signed)
Diet controlled, Hgb A1c today is 6.4. Continue to monitor of tx.

## 2020-05-12 NOTE — Assessment & Plan Note (Signed)
Worsening upper extremity paresthesias, likely diabetic vs. Residual from his prior CVA. Requesting to restart gabapentin.  -- Restart gabapentin 300 mg TID

## 2020-05-12 NOTE — Assessment & Plan Note (Signed)
Continue daily folate, thiamine, and multivitamin.

## 2020-05-13 LAB — VITAMIN B12: Vitamin B-12: 223 pg/mL — ABNORMAL LOW (ref 232–1245)

## 2020-05-13 NOTE — Addendum Note (Signed)
Addended by: Burnell Blanks on: 05/13/2020 02:26 PM   Modules accepted: Orders

## 2020-05-15 ENCOUNTER — Other Ambulatory Visit: Payer: Self-pay

## 2020-05-15 DIAGNOSIS — Z20822 Contact with and (suspected) exposure to covid-19: Secondary | ICD-10-CM | POA: Diagnosis not present

## 2020-05-16 LAB — METHYLMALONIC ACID, SERUM: Methylmalonic Acid: 170 nmol/L (ref 0–378)

## 2020-05-16 LAB — SPECIMEN STATUS REPORT

## 2020-05-16 LAB — NOVEL CORONAVIRUS, NAA: SARS-CoV-2, NAA: NOT DETECTED

## 2020-05-16 LAB — SARS-COV-2, NAA 2 DAY TAT

## 2020-05-25 DIAGNOSIS — Z03818 Encounter for observation for suspected exposure to other biological agents ruled out: Secondary | ICD-10-CM | POA: Diagnosis not present

## 2020-07-09 ENCOUNTER — Encounter: Payer: Self-pay | Admitting: Podiatry

## 2020-07-19 ENCOUNTER — Ambulatory Visit: Payer: Medicaid Other | Admitting: Podiatry

## 2020-07-21 ENCOUNTER — Encounter: Payer: Medicaid Other | Admitting: Internal Medicine

## 2020-07-23 DIAGNOSIS — H04123 Dry eye syndrome of bilateral lacrimal glands: Secondary | ICD-10-CM | POA: Diagnosis not present

## 2020-07-23 DIAGNOSIS — Z961 Presence of intraocular lens: Secondary | ICD-10-CM | POA: Diagnosis not present

## 2020-07-23 DIAGNOSIS — E119 Type 2 diabetes mellitus without complications: Secondary | ICD-10-CM | POA: Diagnosis not present

## 2020-07-23 DIAGNOSIS — H401133 Primary open-angle glaucoma, bilateral, severe stage: Secondary | ICD-10-CM | POA: Diagnosis not present

## 2020-07-26 ENCOUNTER — Other Ambulatory Visit: Payer: Self-pay | Admitting: Internal Medicine

## 2020-07-26 DIAGNOSIS — F1010 Alcohol abuse, uncomplicated: Secondary | ICD-10-CM

## 2020-08-04 ENCOUNTER — Telehealth: Payer: Self-pay | Admitting: *Deleted

## 2020-08-04 ENCOUNTER — Encounter: Payer: Self-pay | Admitting: *Deleted

## 2020-08-04 NOTE — Telephone Encounter (Signed)
Made an appointment with [facility] 08/09/20 9:15

## 2020-08-04 NOTE — Congregational Nurse Program (Signed)
  Dept: [phone]   Congregational Nurse Program Note  Date of Encounter: 08/04/2020  Past Medical History: Past Medical History:  Diagnosis Date  . 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 Diabetes mellitus   . High cholesterol   . Hypertension   . Substance abuse ([facility])    cocaine. Stopped in 2010  . Tobacco use disorder 10/07/2014    Encounter Details:  CNP Questionnaire - 08/04/20 1011      Questionnaire   Patient Status Not Applicable    Race Black or African American    Location Patient Served At [employer]    Uninsured Not Applicable    Food No food insecurities    Housing/Utilities No permanent housing   staying with a friend   Transportation No transportation needs    Interpersonal Safety No, do not feel physically and emotionally safe where you currently live    Medication No medication insecurities    Medical Provider Yes    Referrals Other   [facility]   ED Visit Averted Not Applicable    Life-Saving Intervention Made Not Applicable         Client came to office requesting help in getting his medications organized in a container. 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 Assisted client with medications and made an appt with Dr. Wallis Bamberg [facility] for 08/09/20 at 9:15. Nira Conn. RN CN  [phone]

## 2020-08-05 NOTE — Progress Notes (Deleted)
   Acute Office Visit   Patient ID: Brady Church, male    DOB: Jul 10, 1961, 59 y.o.   MRN: [id number]  Subjective:  CC: ***  HPI 59 y.o. presents today for ***      ACTIVE MEDICATIONS   Current Outpatient Medications on File Prior to Visit  Medication Sig Dispense Refill  . ALPHAGAN P 0.1 % SOLN INSTILL 1 DROP INTO EACH EYE TWICE DAILY  3  . aspirin 325 MG EC tablet Take 1 tablet (325 mg total) by mouth daily. 100 tablet 0  . atorvastatin (LIPITOR) 80 MG tablet TAKE 1 TABLET BY MOUTH ONCE DAILY AT  6  PM 90 tablet 3  . Cyanocobalamin (VITAMIN B 12 PO) Take by mouth.    . dorzolamide-timolol (COSOPT) 22.3-6.8 MG/ML ophthalmic solution Place 1 drop into the right eye 2 (two) times daily. 10 mL 3  . fluorometholone (FML) 0.1 % ophthalmic suspension INSTILL 1 DROP INTO EACH EYE ONCE DAILY  0  . folic acid (FOLVITE) 1 MG tablet Take 1 tablet by mouth once daily 30 tablet 0  . gabapentin (NEURONTIN) 300 MG capsule Take 1 capsule (300 mg total) by mouth 3 (three) times daily. 90 capsule 2  . hydrochlorothiazide (HYDRODIURIL) 25 MG tablet Take 1 tablet (25 mg total) by mouth daily. 90 tablet 3  . latanoprost (XALATAN) 0.005 % ophthalmic solution INSTILL 1 DROP INTO EACH EYE AT BEDTIME    . lisinopril (ZESTRIL) 40 MG tablet Take 1 tablet (40 mg total) by mouth daily. 90 tablet 3  . Multiple Vitamin (MULTIVITAMIN WITH MINERALS) TABS tablet Take 1 tablet by mouth daily. 30 tablet 5  . Multiple Vitamins-Minerals (MULTIVITAMIN ADULTS) TABS Take 1 tablet by mouth daily. 90 tablet 3  . sildenafil (REVATIO) 20 MG tablet TAKE 2 TO 5 TABLETS BY MOUTH AS NEEDED 30 MINUTES PRIOR TO SEXUAL ACTIVITY 20 tablet 1  . thiamine (VITAMIN B-1) 100 MG tablet Take 2.5 tablets (250 mg total) by mouth daily. 30 tablet 5   No current facility-administered medications on file prior to visit.    ROS  Review of Systems  Objective:   There were no vitals taken for this visit. Wt Readings from Last 3 Encounters:   05/12/20 223 lb 3.2 oz (101.2 kg)  02/11/20 231 lb 4.8 oz (104.9 kg)  11/05/19 235 lb (106.6 kg)   BP Readings from Last 3 Encounters:  08/04/20 (!) 144/95  05/12/20 (!) 155/101  04/09/20 (!) 137/97   Physical Exam  Assessment & Plan:   Problem List Items Addressed This Visit    None        Pt discussed with ***  Elige Radon, MD Internal Medicine Resident PGY-2 [facility] Pager: [phone] 08/05/2020 8:34 AM

## 2020-08-09 ENCOUNTER — Ambulatory Visit (INDEPENDENT_AMBULATORY_CARE_PROVIDER_SITE_OTHER): Payer: Medicaid Other | Admitting: Podiatry

## 2020-08-09 ENCOUNTER — Encounter: Payer: Self-pay | Admitting: Podiatry

## 2020-08-09 ENCOUNTER — Encounter: Payer: Medicaid Other | Admitting: Internal Medicine

## 2020-08-09 ENCOUNTER — Other Ambulatory Visit: Payer: Self-pay

## 2020-08-09 DIAGNOSIS — M2012 Hallux valgus (acquired), left foot: Secondary | ICD-10-CM

## 2020-08-09 DIAGNOSIS — M79676 Pain in unspecified toe(s): Secondary | ICD-10-CM

## 2020-08-09 DIAGNOSIS — B351 Tinea unguium: Secondary | ICD-10-CM

## 2020-08-09 DIAGNOSIS — E1142 Type 2 diabetes mellitus with diabetic polyneuropathy: Secondary | ICD-10-CM | POA: Diagnosis not present

## 2020-08-09 DIAGNOSIS — M2011 Hallux valgus (acquired), right foot: Secondary | ICD-10-CM

## 2020-08-09 NOTE — Progress Notes (Signed)
Subjective: Brady Church presents today preventative diabetic foot care and painful mycotic nails b/l that are difficult to trim. Pain interferes with ambulation. Aggravating factors include wearing enclosed shoe gear. Pain is relieved with periodic professional debridement.   He voices no new pedal problems on today's visit.  Brady Poll, MD is patient's PCP. Last visit was: 05/12/2020.  Past Medical History:  Diagnosis Date  . 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 Diabetes mellitus   . High cholesterol   . Hypertension   . Substance abuse ([facility])    cocaine. Stopped in 2010  . Tobacco use disorder 10/07/2014     Current Outpatient Medications on File Prior to Visit  Medication Sig Dispense Refill  . ALPHAGAN P 0.1 % SOLN INSTILL 1 DROP INTO EACH EYE TWICE DAILY  3  . aspirin 325 MG EC tablet Take 1 tablet (325 mg total) by mouth daily. 100 tablet 0  . atorvastatin (LIPITOR) 80 MG tablet TAKE 1 TABLET BY MOUTH ONCE DAILY AT  6  PM 90 tablet 3  . Cyanocobalamin (VITAMIN B 12 PO) Take by mouth.    . dorzolamide-timolol (COSOPT) 22.3-6.8 MG/ML ophthalmic solution Place 1 drop into the right eye 2 (two) times daily. 10 mL 3  . fluorometholone (FML) 0.1 % ophthalmic suspension INSTILL 1 DROP INTO EACH EYE ONCE DAILY  0  . folic acid (FOLVITE) 1 MG tablet Take 1 tablet by mouth once daily 30 tablet 0  . gabapentin (NEURONTIN) 300 MG capsule Take 1 capsule (300 mg total) by mouth 3 (three) times daily. 90 capsule 2  . hydrochlorothiazide (HYDRODIURIL) 25 MG tablet Take 1 tablet (25 mg total) by mouth daily. 90 tablet 3  . latanoprost (XALATAN) 0.005 % ophthalmic solution INSTILL 1 DROP INTO EACH EYE AT BEDTIME    . lisinopril (ZESTRIL) 40 MG tablet Take 1 tablet (40 mg total) by mouth daily. 90 tablet 3  . Multiple Vitamin (MULTIVITAMIN WITH MINERALS) TABS tablet Take 1 tablet by mouth daily. 30 tablet 5  . Multiple Vitamins-Minerals (MULTIVITAMIN ADULTS)  TABS Take 1 tablet by mouth daily. 90 tablet 3  . sildenafil (REVATIO) 20 MG tablet TAKE 2 TO 5 TABLETS BY MOUTH AS NEEDED 30 MINUTES PRIOR TO SEXUAL ACTIVITY 20 tablet 1  . thiamine (VITAMIN B-1) 100 MG tablet Take 2.5 tablets (250 mg total) by mouth daily. 30 tablet 5   No current facility-administered medications on file prior to visit.     No Known Allergies  Objective: Brady Church is a pleasant 59 y.o. y.o. Patient Race: Black or African American [2]  male in NAD. AAO x 3.  There were no vitals filed for this visit.  Vascular Examination: Neurovascular status unchanged b/l lower extremities. Capillary refill time to digits immediate b/l. Palpable DP pulses b/l. Palpable PT pulses b/l. Pedal hair sparse b/l. Skin temperature gradient within normal limits b/l. No pain with calf compression b/l.  Dermatological Examination: Pedal skin with normal turgor, texture and tone bilaterally. No open wounds bilaterally. No interdigital macerations bilaterally. Toenails 1-5 b/l elongated, discolored, dystrophic, thickened, crumbly with subungual debris and tenderness to dorsal palpation. Minimal hyperkeratosis b/l hallux and submet head 1 b/l, right 5th digit dorsal PIPJ.  Musculoskeletal: Normal muscle strength 5/5 to all lower extremity muscle groups bilaterally. No pain crepitus or joint limitation noted with ROM b/l. Hallux valgus with bunion deformity noted b/l lower extremities.  Neurological Examination: Protective sensation diminished with 10g monofilament  b/l. Vibratory sensation decreased b/l. Proprioception intact bilaterally.  Assessment: 1. Pain due to onychomycosis of toenail   2. Hallux valgus, acquired, bilateral   3. Diabetic peripheral neuropathy associated with type 2 diabetes mellitus ([facility])     Plan: -Examined patient. -Medicaid ABN has been signed for 2021.  -Continue diabetic foot care principles. -Toenails 1-5 b/l were debrided in length and girth with sterile  nail nippers and dremel without iatrogenic bleeding.  -Patient to report any pedal injuries to medical professional immediately. -Patient to continue soft, supportive shoe gear daily. -Patient/POA to call should there be question/concern in the interim.  Return in about 3 months (around 11/08/2020).  [name], DPM

## 2020-08-13 ENCOUNTER — Ambulatory Visit: Payer: Medicaid Other | Admitting: Podiatry

## 2020-09-15 ENCOUNTER — Encounter: Payer: Medicaid Other | Admitting: Student

## 2020-09-29 ENCOUNTER — Encounter: Payer: Medicaid Other | Admitting: Internal Medicine

## 2020-10-03 ENCOUNTER — Other Ambulatory Visit: Payer: Self-pay

## 2020-10-03 ENCOUNTER — Encounter (HOSPITAL_COMMUNITY): Payer: Self-pay | Admitting: Emergency Medicine

## 2020-10-03 ENCOUNTER — Emergency Department (HOSPITAL_COMMUNITY)
Admission: EM | Admit: 2020-10-03 | Discharge: 2020-10-03 | Disposition: A | Discharge status: Home / Self Care | Payer: Medicaid Other | Attending: Emergency Medicine | Admitting: Emergency Medicine

## 2020-10-03 DIAGNOSIS — Z7982 Long term (current) use of aspirin: Secondary | ICD-10-CM | POA: Diagnosis not present

## 2020-10-03 DIAGNOSIS — Z760 Encounter for issue of repeat prescription: Secondary | ICD-10-CM

## 2020-10-03 DIAGNOSIS — R519 Headache, unspecified: Secondary | ICD-10-CM | POA: Diagnosis present

## 2020-10-03 DIAGNOSIS — E1140 Type 2 diabetes mellitus with diabetic neuropathy, unspecified: Secondary | ICD-10-CM | POA: Insufficient documentation

## 2020-10-03 DIAGNOSIS — I5042 Chronic combined systolic (congestive) and diastolic (congestive) heart failure: Secondary | ICD-10-CM | POA: Diagnosis not present

## 2020-10-03 DIAGNOSIS — Z87891 Personal history of nicotine dependence: Secondary | ICD-10-CM | POA: Diagnosis not present

## 2020-10-03 DIAGNOSIS — R110 Nausea: Secondary | ICD-10-CM | POA: Diagnosis not present

## 2020-10-03 DIAGNOSIS — E1136 Type 2 diabetes mellitus with diabetic cataract: Secondary | ICD-10-CM | POA: Diagnosis not present

## 2020-10-03 DIAGNOSIS — Z79899 Other long term (current) drug therapy: Secondary | ICD-10-CM | POA: Diagnosis not present

## 2020-10-03 DIAGNOSIS — I110 Hypertensive heart disease with heart failure: Secondary | ICD-10-CM | POA: Diagnosis not present

## 2020-10-03 DIAGNOSIS — I10 Essential (primary) hypertension: Secondary | ICD-10-CM | POA: Diagnosis not present

## 2020-10-03 DIAGNOSIS — B029 Zoster without complications: Secondary | ICD-10-CM | POA: Diagnosis not present

## 2020-10-03 DIAGNOSIS — H409 Unspecified glaucoma: Secondary | ICD-10-CM | POA: Diagnosis not present

## 2020-10-03 DIAGNOSIS — H5712 Ocular pain, left eye: Secondary | ICD-10-CM | POA: Diagnosis not present

## 2020-10-03 DIAGNOSIS — G4489 Other headache syndrome: Secondary | ICD-10-CM | POA: Diagnosis not present

## 2020-10-03 DIAGNOSIS — R609 Edema, unspecified: Secondary | ICD-10-CM | POA: Diagnosis not present

## 2020-10-03 LAB — COMPREHENSIVE METABOLIC PANEL
ALT: 22 U/L (ref 0–44)
AST: 30 U/L (ref 15–41)
Albumin: 3.9 g/dL (ref 3.5–5.0)
Alkaline Phosphatase: 48 U/L (ref 38–126)
Anion gap: 13 (ref 5–15)
BUN: 8 mg/dL (ref 6–20)
CO2: 24 mmol/L (ref 22–32)
Calcium: 9.2 mg/dL (ref 8.9–10.3)
Chloride: 100 mmol/L (ref 98–111)
Creatinine, Ser: 0.99 mg/dL (ref 0.61–1.24)
GFR, Estimated: >60 mL/min (ref >60)
Glucose, Bld: 161 mg/dL — ABNORMAL HIGH (ref 70–99)
Potassium: 4.5 mmol/L (ref 3.5–5.1)
Sodium: 137 mmol/L (ref 135–145)
Total Bilirubin: 0.6 mg/dL (ref 0.3–1.2)
Total Protein: 6.9 g/dL (ref 6.5–8.1)

## 2020-10-03 LAB — CBC WITH DIFFERENTIAL/PLATELET
Abs Immature Granulocytes: 0.03 10*3/uL (ref 0.00–0.07)
Basophils Absolute: 0 10*3/uL (ref 0.0–0.1)
Basophils Relative: 0 %
Eosinophils Absolute: 0 10*3/uL (ref 0.0–0.5)
Eosinophils Relative: 0 %
HCT: 42.9 % (ref 39.0–52.0)
Hemoglobin: 15 g/dL (ref 13.0–17.0)
Immature Granulocytes: 0 %
Lymphocytes Relative: 17 %
Lymphs Abs: 1.3 10*3/uL (ref 0.7–4.0)
MCH: 32.5 pg (ref 26.0–34.0)
MCHC: 35 g/dL (ref 30.0–36.0)
MCV: 93.1 fL (ref 80.0–100.0)
Monocytes Absolute: 0.4 10*3/uL (ref 0.1–1.0)
Monocytes Relative: 5 %
Neutro Abs: 6 10*3/uL (ref 1.7–7.7)
Neutrophils Relative %: 78 %
Platelets: 365 10*3/uL (ref 150–400)
RBC: 4.61 MIL/uL (ref 4.22–5.81)
RDW: 14.2 % (ref 11.5–15.5)
WBC: 7.8 10*3/uL (ref 4.0–10.5)
nRBC: 0 % (ref 0.0–0.2)

## 2020-10-03 LAB — CBG MONITORING, ED: Glucose-Capillary: 159 mg/dL — ABNORMAL HIGH (ref 70–99)

## 2020-10-03 MED ORDER — OXYCODONE-ACETAMINOPHEN 5-325 MG PO TABS
1.0000 | ORAL_TABLET | Freq: Once | ORAL | Status: AC
  Administered 2020-10-03: 1 via ORAL
  Filled 2020-10-03: qty 1

## 2020-10-03 MED ORDER — FLUOROMETHOLONE 0.1 % OP SUSP
1.0000 [drp] | Freq: Once | OPHTHALMIC | Status: AC
  Administered 2020-10-03: 1 [drp] via OPHTHALMIC
  Filled 2020-10-03: qty 5

## 2020-10-03 MED ORDER — VALACYCLOVIR HCL 1 G PO TABS: 1000.0000 mg | ORAL_TABLET | Freq: Three times a day (TID) | ORAL | 0 refills | Status: DC

## 2020-10-03 MED ORDER — LATANOPROST 0.005 % OP SOLN: 1.0000 [drp] | Freq: Every day | OPHTHALMIC | Status: DC

## 2020-10-03 MED ORDER — HYDROCHLOROTHIAZIDE 25 MG PO TABS
25.0000 mg | ORAL_TABLET | Freq: Every day | ORAL | Status: AC
  Administered 2020-10-03: 25 mg via ORAL
  Filled 2020-10-03: qty 1

## 2020-10-03 MED ORDER — TETRACAINE HCL 0.5 % OP SOLN
2.0000 [drp] | Freq: Once | OPHTHALMIC | Status: AC
  Administered 2020-10-03: 2 [drp] via OPHTHALMIC
  Filled 2020-10-03: qty 4

## 2020-10-03 MED ORDER — BRIMONIDINE TARTRATE 0.15 % OP SOLN
1.0000 [drp] | Freq: Once | OPHTHALMIC | Status: AC
  Administered 2020-10-03: 1 [drp] via OPHTHALMIC
  Filled 2020-10-03: qty 5

## 2020-10-03 MED ORDER — HYDROCODONE-ACETAMINOPHEN 5-325 MG PO TABS
1.0000 | ORAL_TABLET | ORAL | 0 refills | Status: DC | PRN
Start: 2020-10-03 — End: 2021-04-13

## 2020-10-03 MED ORDER — VALACYCLOVIR HCL 500 MG PO TABS
1000.0000 mg | ORAL_TABLET | Freq: Once | ORAL | Status: AC
  Administered 2020-10-03: 1000 mg via ORAL
  Filled 2020-10-03: qty 2

## 2020-10-03 MED ORDER — DORZOLAMIDE HCL-TIMOLOL MAL 2-0.5 % OP SOLN
1.0000 [drp] | Freq: Two times a day (BID) | OPHTHALMIC | Status: DC
  Administered 2020-10-03: 1 [drp] via OPHTHALMIC
  Filled 2020-10-03: qty 10

## 2020-10-03 MED ORDER — LISINOPRIL 20 MG PO TABS
40.0000 mg | ORAL_TABLET | Freq: Once | ORAL | Status: AC
  Administered 2020-10-03: 40 mg via ORAL
  Filled 2020-10-03: qty 2

## 2020-10-03 NOTE — ED Triage Notes (Signed)
   Patient BIB EMS for hypertension, eye pain, and headache.  Patient states he started having eye pain yesterday morning and feels like they are swollen.  Patient states he is legally blind in L eye and only has 5% vision in R eye.  Both eyes are red and L eye is swollen.  Patient states he has 4 different prescribed eye drops but has not had them in about a month.  Hx of hypertension.  BP 158/110 in triage.  CBG 154
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
## 2020-10-03 NOTE — Discharge Instructions (Addendum)
Follow up with your eye doctor this week. Use your drops as prescribed for your glaucoma. New prescription for Valtrex for shingles on your face causing your eye pain. New prescription for Norco for pain from the shingles. TAKE the Valtrex and complete the full course. Take the Norco as needed.

## 2020-10-03 NOTE — ED Notes (Signed)
Pt discharge instructions reviewed with the patient. The patient verbalized understanding of instructions. Pt discharged. 

## 2020-10-04 DIAGNOSIS — H04123 Dry eye syndrome of bilateral lacrimal glands: Secondary | ICD-10-CM | POA: Diagnosis not present

## 2020-10-04 DIAGNOSIS — Z961 Presence of intraocular lens: Secondary | ICD-10-CM | POA: Diagnosis not present

## 2020-10-04 DIAGNOSIS — H401133 Primary open-angle glaucoma, bilateral, severe stage: Secondary | ICD-10-CM | POA: Diagnosis not present

## 2020-10-04 NOTE — Progress Notes (Signed)
   CC: Shingles, hypertension, diabetes  HPI:  Mr.Brady Church is a 59 y.o. male with history as below presenting for follow-up on the above. Please refer to problem based charting for further details of assessment and plan of current problem and chronic medical conditions.  Past Medical History:  Diagnosis Date  . 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 Diabetes mellitus   . High cholesterol   . Hypertension   . Substance abuse (HCC)    cocaine. Stopped in 2010  . Tobacco use disorder 10/07/2014   Review of Systems:   Review of Systems  Eyes: Positive for blurred vision (Baseline). Negative for pain.  Gastrointestinal: Negative for abdominal pain, nausea and vomiting.  Musculoskeletal:       Burning and aching pain to left forehead and periorbital area  Neurological: Positive for sensory change (Neuropathic pain) and headaches.  All other systems reviewed and are negative.    Physical Exam: Vitals:   10/05/20 0944  BP: 100/72  Pulse: 91  Temp: 98 F (36.7 C)  TempSrc: Oral  SpO2: 99%  Weight: 213 lb 8 oz (96.8 kg)  Height: 5\' 8"  (1.727 m)   Constitutional: no acute distress Head: atraumatic ENT: external ears normal, scattered vesicular lesions on erythematous base to the left periorbital area and roughly the V1 distribution, no clear lesions to the eye itself Cardiovascular: regular rate and rhythm, normal heart sounds Pulmonary: effort normal, normal breath sounds bilaterally Abdominal: flat, nontender, no rebound tenderness, bowel sounds normal Musculoskeletal: Bilateral feet without any wounds, moderate sized callus to the right foot Skin: warm and dry Neurological: alert, no focal deficit Psychiatric: normal mood and affect  Assessment & Plan:   See Encounters Tab for problem based charting.  Patient seen with Dr. 

## 2020-10-05 ENCOUNTER — Encounter: Payer: Self-pay | Admitting: Student

## 2020-10-05 ENCOUNTER — Other Ambulatory Visit: Payer: Self-pay

## 2020-10-05 ENCOUNTER — Ambulatory Visit (INDEPENDENT_AMBULATORY_CARE_PROVIDER_SITE_OTHER): Payer: Medicaid Other | Admitting: Student

## 2020-10-05 VITALS — BP 100/72 | HR 91 | Temp 98.0°F | Ht 68.0 in | Wt 213.5 lb

## 2020-10-05 DIAGNOSIS — F1010 Alcohol abuse, uncomplicated: Secondary | ICD-10-CM | POA: Diagnosis not present

## 2020-10-05 DIAGNOSIS — E0836 Diabetes mellitus due to underlying condition with diabetic cataract: Secondary | ICD-10-CM | POA: Diagnosis not present

## 2020-10-05 DIAGNOSIS — L853 Xerosis cutis: Secondary | ICD-10-CM

## 2020-10-05 DIAGNOSIS — N529 Male erectile dysfunction, unspecified: Secondary | ICD-10-CM

## 2020-10-05 DIAGNOSIS — I10 Essential (primary) hypertension: Secondary | ICD-10-CM | POA: Diagnosis not present

## 2020-10-05 DIAGNOSIS — H409 Unspecified glaucoma: Secondary | ICD-10-CM | POA: Diagnosis not present

## 2020-10-05 DIAGNOSIS — B028 Zoster with other complications: Secondary | ICD-10-CM | POA: Diagnosis not present

## 2020-10-05 DIAGNOSIS — G6289 Other specified polyneuropathies: Secondary | ICD-10-CM | POA: Diagnosis not present

## 2020-10-05 DIAGNOSIS — E785 Hyperlipidemia, unspecified: Secondary | ICD-10-CM

## 2020-10-05 DIAGNOSIS — I63512 Cerebral infarction due to unspecified occlusion or stenosis of left middle cerebral artery: Secondary | ICD-10-CM

## 2020-10-05 DIAGNOSIS — B029 Zoster without complications: Secondary | ICD-10-CM | POA: Insufficient documentation

## 2020-10-05 LAB — POCT GLYCOSYLATED HEMOGLOBIN (HGB A1C): Hemoglobin A1C: 6.6 % — AB (ref 4.0–5.6)

## 2020-10-05 LAB — GLUCOSE, CAPILLARY: Glucose-Capillary: 120 mg/dL — ABNORMAL HIGH (ref 70–99)

## 2020-10-05 MED ORDER — LISINOPRIL 40 MG PO TABS: 40.0000 mg | ORAL_TABLET | Freq: Every day | ORAL | 5 refills | Status: DC

## 2020-10-05 MED ORDER — FLUOROMETHOLONE 0.1 % OP SUSP: OPHTHALMIC | 5 refills | Status: DC

## 2020-10-05 MED ORDER — FOLIC ACID 1 MG PO TABS: 1.0000 mg | ORAL_TABLET | Freq: Every day | ORAL | 5 refills | Status: DC

## 2020-10-05 MED ORDER — ASPIRIN 81 MG PO TBEC: 325.0000 mg | DELAYED_RELEASE_TABLET | Freq: Every day | ORAL | 5 refills | Status: DC

## 2020-10-05 MED ORDER — AQUAPHOR EX OINT: TOPICAL_OINTMENT | CUTANEOUS | 0 refills | Status: DC | PRN

## 2020-10-05 MED ORDER — VITAMIN B 12 250 MCG PO LOZG: 250.0000 ug | LOZENGE | Freq: Every day | ORAL | 5 refills | Status: DC

## 2020-10-05 MED ORDER — GABAPENTIN 300 MG PO CAPS: 300.0000 mg | ORAL_CAPSULE | Freq: Three times a day (TID) | ORAL | 5 refills | Status: DC

## 2020-10-05 MED ORDER — VITAMIN B-1 100 MG PO TABS: 250.0000 mg | ORAL_TABLET | Freq: Every day | ORAL | 5 refills | Status: DC

## 2020-10-05 MED ORDER — SILDENAFIL CITRATE 20 MG PO TABS: ORAL_TABLET | ORAL | 5 refills | Status: DC

## 2020-10-05 MED ORDER — HYDROCHLOROTHIAZIDE 25 MG PO TABS: 25.0000 mg | ORAL_TABLET | Freq: Every day | ORAL | 5 refills | Status: DC

## 2020-10-05 MED ORDER — ATORVASTATIN CALCIUM 80 MG PO TABS: ORAL_TABLET | ORAL | 5 refills | Status: DC

## 2020-10-05 MED ORDER — ALPHAGAN P 0.1 % OP SOLN: OPHTHALMIC | 5 refills | Status: DC

## 2020-10-05 MED ORDER — DORZOLAMIDE HCL-TIMOLOL MAL 2-0.5 % OP SOLN: 1.0000 [drp] | Freq: Two times a day (BID) | OPHTHALMIC | 5 refills | Status: DC

## 2020-10-05 NOTE — Assessment & Plan Note (Signed)
BP 118/78 manual. On lisinopril 40 mg, HCTZ 25 mg.  Reports home blood pressures with 150s to 160s systolic, but has not had HCTZ for about 1 month.   Denies chest pain, palpitations, dizziness, LOC.  Had blood work in the ED this week which was benign.    -Continue lisinopril 40 mg -Continue HCTZ 25 mg
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
## 2020-10-05 NOTE — Patient Instructions (Addendum)
Thank you for allowing [place] to be a part of your care today, it was a pleasure seeing you. We discussed your shingles, hypertension, diabetes, alcohol use  I am checking these labs: None  I have made these changes to your medications: Refilled your medications  For your shingles, I expect this to get better over time but if you can pick up your Valtrex, that will help you cover sooner.  Please follow-up with your ophthalmologist.  Your blood pressure is good here today, but since she reported elevated levels at home I will refill your medications.  Please continue your lisinopril and HCTZ.  Your diabetes has been well controlled in the past.  I am checking A1c, I will call you with the result.  Please follow up in 6 months   Thank you, and please call the [facility] Medicine [facility] at [phone] if you have any questions.  Best, Dr. Imogene Burn

## 2020-10-05 NOTE — Assessment & Plan Note (Addendum)
Follow-up on ED for this on 10/03/2020.  Has vesicular rash with erythematous base scattered over the left forehead in a V1 distribution.  Reports these are achy and burning.  Denies pain in the actual eye, but had a headache with this earlier this week.  History of legal blindness in left eye, is able to see bright lights only in the right eye which is secondary to glaucoma.  He does not have his regular glaucoma eyedrops because there is still the last month.  On assessment in the ED had elevated IOP on the left side.  The ED refill his eyedrops, and also discharged with Norco and Valtrex.  He unfortunately was unable to afford the Norco or Valtrex, but did receive the eyedrops.  States his symptoms are not significantly changed with the past 2 days, was not able to afford the Valtrex or Norco.  Saw his ophthalmologist yesterday, and a follow-up tomorrow as well.  -Recommended that he pick up his Valtrex if affordable -Follow-up with ophthalmology

## 2020-10-05 NOTE — Assessment & Plan Note (Signed)
Hemoglobin A1C  Date Value Ref Range Status  10/05/2020 6.6 (A) 4.0 - 5.6 % Final  05/12/2020 6.4 (A) 4.0 - 5.6 % Final  02/11/2020 6.6 (A) 4.0 - 5.6 % Final  Diet controlled.  On exam, does not have any foot wounds.  Does have a moderate sized callus on the left foot.  He has good follow-up with podiatry, will see them at the end of the month.  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

## 2020-10-06 ENCOUNTER — Encounter: Payer: Self-pay | Admitting: *Deleted

## 2020-10-06 DIAGNOSIS — H401133 Primary open-angle glaucoma, bilateral, severe stage: Secondary | ICD-10-CM | POA: Diagnosis not present

## 2020-10-06 NOTE — Congregational Nurse Program (Signed)
  Dept: [phone]   Congregational Nurse Program Note  Date of Encounter: 10/06/2020  Past Medical History: Past Medical History:  Diagnosis Date  . 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 Diabetes mellitus   . High cholesterol   . Hypertension   . Substance abuse (HCC)    cocaine. Stopped in 2010  . Tobacco use disorder 10/07/2014    Encounter Details:  CNP Questionnaire - 10/06/20 0916      Questionnaire   Do you give verbal consent to treat you today? Yes    Visit Setting Church or Organization    Location Patient Served At [facility]    Patient Status Homeless    Insurance Medicaid    Intervention Support    Housing/Utilities No permanent housing    Transportation Need transportation assistance    Referrals Other   bus passes for eye appt         Followed up with client since his last ED visit. Pt has an appt with Dr Dione Booze eye specialist this morning at 1130. Gave client two bus passes for transportation to and from appt. Client has no other need requests at this time.  Yamile Roedl RN CN [phone]

## 2020-10-07 NOTE — Progress Notes (Signed)
[facility] Medicine Clinic Attending  I saw and evaluated the patient.  I personally confirmed the key portions of the history and exam documented by Dr. Chen and I reviewed pertinent patient test results.  The assessment, diagnosis, and plan were formulated together and I agree with the documentation in the resident's note.  Mavis Fichera, M.D., Ph.D.  

## 2020-10-15 DIAGNOSIS — H401133 Primary open-angle glaucoma, bilateral, severe stage: Secondary | ICD-10-CM | POA: Diagnosis not present

## 2020-10-15 DIAGNOSIS — E119 Type 2 diabetes mellitus without complications: Secondary | ICD-10-CM | POA: Diagnosis not present

## 2020-10-15 DIAGNOSIS — Z961 Presence of intraocular lens: Secondary | ICD-10-CM | POA: Diagnosis not present

## 2020-10-15 DIAGNOSIS — H04123 Dry eye syndrome of bilateral lacrimal glands: Secondary | ICD-10-CM | POA: Diagnosis not present

## 2020-11-03 ENCOUNTER — Ambulatory Visit: Payer: Medicaid Other | Admitting: Podiatry

## 2020-11-08 ENCOUNTER — Ambulatory Visit (INDEPENDENT_AMBULATORY_CARE_PROVIDER_SITE_OTHER): Payer: Medicaid Other | Admitting: Podiatry

## 2020-11-08 ENCOUNTER — Encounter: Payer: Self-pay | Admitting: Podiatry

## 2020-11-08 ENCOUNTER — Other Ambulatory Visit: Payer: Self-pay

## 2020-11-08 DIAGNOSIS — B351 Tinea unguium: Secondary | ICD-10-CM | POA: Diagnosis not present

## 2020-11-08 DIAGNOSIS — M79676 Pain in unspecified toe(s): Secondary | ICD-10-CM

## 2020-11-08 DIAGNOSIS — E1142 Type 2 diabetes mellitus with diabetic polyneuropathy: Secondary | ICD-10-CM

## 2020-11-08 DIAGNOSIS — M79674 Pain in right toe(s): Secondary | ICD-10-CM

## 2020-11-08 DIAGNOSIS — M2012 Hallux valgus (acquired), left foot: Secondary | ICD-10-CM

## 2020-11-08 DIAGNOSIS — E119 Type 2 diabetes mellitus without complications: Secondary | ICD-10-CM

## 2020-11-08 DIAGNOSIS — M79675 Pain in left toe(s): Secondary | ICD-10-CM | POA: Diagnosis not present

## 2020-11-08 DIAGNOSIS — M2011 Hallux valgus (acquired), right foot: Secondary | ICD-10-CM

## 2020-11-13 NOTE — Progress Notes (Signed)
ANNUAL DIABETIC FOOT EXAM  Subjective: Brady Church presents today for for annual diabetic foot examination and painful thick toenails that are difficult to trim. Pain interferes with ambulation. Aggravating factors include wearing enclosed shoe gear. Pain is relieved with periodic professional debridement..  Patient relates 12 year h/o diabetes.  Patient no h/o foot wounds.  Patient has h/o neuropathy and takes gabapentin.   Patient did not check blood glucose this morning.  Brady Poll, MD is patient's PCP. Last visit was 05/12/2020.  Past Medical History:  Diagnosis Date  . 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 Diabetes mellitus   . High cholesterol   . Hypertension   . Substance abuse (HCC)    cocaine. Stopped in 2010  . Tobacco use disorder 10/07/2014   Patient Active Problem List   Diagnosis Date Noted  . Shingles 10/05/2020  . Vitamin B12 deficiency 08/06/2019  . Erectile dysfunction 05/06/2018  . Post-nasal drip 10/01/2017  . Peripheral neuropathy 08/06/2017  . Vasovagal syncope   . Adjustment disorder with mixed anxiety and depressed mood   . CVA (cerebral vascular accident) (HCC) 02/19/2017  . Intracranial vascular stenosis 02/16/2017  . History of substance abuse (HCC) 02/16/2017  . Prolonged QT interval 02/16/2017  . Chronic combined systolic and diastolic congestive heart failure (HCC) 05/12/2015  . Stable angina (HCC) 01/07/2015  . Alcohol abuse 11/06/2014  . History of tobacco use 10/07/2014  . Healthcare maintenance 01/23/2014  . Glaucoma 05/11/2012  . Hyperlipidemia LDL goal <100 03/22/2012  . GERD (gastroesophageal reflux disease) 01/17/2012  . Hypertension 01/17/2012  . Diabetes mellitus with diabetic cataract (HCC) 03/08/2009   Past Surgical History:  Procedure Laterality Date  . CATARACT EXTRACTION  01/08/2012   Right eye  . EYE SURGERY     x 4 total    Current Outpatient Medications on File Prior to Visit   Medication Sig Dispense Refill  . ALPHAGAN P 0.1 % SOLN INSTILL 1 DROP INTO EACH EYE TWICE DAILY 5 mL 5  . aspirin 81 MG EC tablet Take 4 tablets (325 mg total) by mouth daily. 30 tablet 5  . atorvastatin (LIPITOR) 80 MG tablet TAKE 1 TABLET BY MOUTH ONCE DAILY AT  6  PM 30 tablet 5  . Cyanocobalamin (VITAMIN B 12) 250 MCG LOZG Take 250 mcg by mouth daily. 30 lozenge 5  . dorzolamide-timolol (COSOPT) 22.3-6.8 MG/ML ophthalmic solution Place 1 drop into the right eye 2 (two) times daily. 10 mL 5  . fluorometholone (FML) 0.1 % ophthalmic suspension INSTILL 1 DROP INTO EACH EYE ONCE DAILY 5 mL 5  . folic acid (FOLVITE) 1 MG tablet Take 1 tablet (1 mg total) by mouth daily. 30 tablet 5  . gabapentin (NEURONTIN) 300 MG capsule Take 1 capsule (300 mg total) by mouth 3 (three) times daily. 90 capsule 5  . hydrochlorothiazide (HYDRODIURIL) 25 MG tablet Take 1 tablet (25 mg total) by mouth daily. 30 tablet 5  . HYDROcodone-acetaminophen (NORCO/VICODIN) 5-325 MG tablet Take 1 tablet by mouth every 4 (four) hours as needed. 10 tablet 0  . lisinopril (ZESTRIL) 40 MG tablet Take 1 tablet (40 mg total) by mouth daily. 30 tablet 5  . mineral oil-hydrophilic petrolatum (AQUAPHOR) ointment Apply topically as needed for dry skin. 99 g 0  . sildenafil (REVATIO) 20 MG tablet TAKE 2 TO 5 TABLETS BY MOUTH AS NEEDED 30 MINUTES PRIOR TO SEXUAL ACTIVITY 20 tablet 5  . thiamine (VITAMIN B-1) 100  MG tablet Take 2.5 tablets (250 mg total) by mouth daily. 30 tablet 5  . valACYclovir (VALTREX) 1000 MG tablet Take 1 tablet (1,000 mg total) by mouth 3 (three) times daily. 21 tablet 0   No current facility-administered medications on file prior to visit.    No Known Allergies Social History   Occupational History  . Not on file  Tobacco Use  . Smoking status: Former Smoker    Packs/day: 0.50    Types: Cigarettes    Quit date: 02/18/2017    Years since quitting: 3.7  . Smokeless tobacco: Never Used  . Tobacco comment:  Quit over 1 year ago.  Substance and Sexual Activity  . Alcohol use: Yes    Alcohol/week: 0.0 standard drinks    Comment: Beer- 40 oz. every day.  . Drug use: No    Types: Cocaine    Comment: Previous cocaine user. Stopped 2 years before.  Marland Kitchen Sexual activity: Not on file   Family History  Problem Relation Age of Onset  . Coronary artery disease Mother 48       died of MI  . Cancer Father 45       Some GI cancer. Not sure if it was Colon Cancer or not.  . Hypertension Father   . Diabetes Maternal Grandmother    Immunization History  Administered Date(s) Administered  . Influenza,inj,Quad PF,6+ Mos 01/07/2018, 11/05/2019, 08/11/2020  . Pneumococcal Polysaccharide-23 06/04/2017     Review of Systems: Negative except as noted in the HPI.  Objective: There were no vitals filed for this visit.  Brady Church is a pleasant 59 y.o. male in NAD. AAO X 3.  Vascular Examination: Capillary refill time to digits immediate b/l. Palpable pedal pulses b/l LE. Pedal hair sparse. Lower extremity skin temperature gradient within normal limits. No pain with calf compression b/l.  Dermatological Examination: Pedal skin with normal turgor, texture and tone bilaterally. No open wounds bilaterally. No interdigital macerations bilaterally. Toenails 1-5 b/l elongated, discolored, dystrophic, thickened, crumbly with subungual debris and tenderness to dorsal palpation.  Musculoskeletal Examination: Normal muscle strength 5/5 to all lower extremity muscle groups bilaterally. No pain crepitus or joint limitation noted with ROM b/l. Hallux valgus with bunion deformity noted b/l lower extremities.  Footwear Assessment: Does the patient wear appropriate shoes? Yes.. Does the patient need inserts/orthotics? No.  Neurological Examination: Protective sensation diminished with 10g monofilament b/l. Vibratory sensation decreased b/l.  Hemoglobin A1C Latest Ref Rng & Units 10/05/2020 05/12/2020 02/11/2020   HGBA1C 4.0 - 5.6 % 6.6(A) 6.4(A) 6.6(A)  Some recent data might be hidden   Assessment: 1. Pain due to onychomycosis of toenail   2. Hallux valgus, acquired, bilateral   3. Diabetic peripheral neuropathy associated with type 2 diabetes mellitus (HCC)   4. Encounter for diabetic foot exam (HCC)     ADA Risk Categorization: High Risk  Patient has one or more of the following: Loss of protective sensation Absent pedal pulses Severe Foot deformity History of foot ulcer  Plan: -Examined patient. -No new findings. No new orders. -Diabetic foot examination performed on today's visit. -Patient to continue soft, supportive shoe gear daily. -Toenails 1-5 b/l were debrided in length and girth with sterile nail nippers and dremel without iatrogenic bleeding.  -Patient to report any pedal injuries to medical professional immediately. -Patient/POA to call should there be question/concern in the interim.  Return in about 3 months (around 02/06/2021) for diabetic foot care.  [name], DPM

## 2020-11-30 ENCOUNTER — Encounter: Payer: Self-pay | Admitting: *Deleted

## 2020-11-30 NOTE — Congregational Nurse Program (Signed)
  Dept: [phone]   Congregational Nurse Program Note  Date of Encounter: 11/30/2020  Past Medical History: Past Medical History:  Diagnosis Date  . 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 Diabetes mellitus   . High cholesterol   . Hypertension   . Substance abuse (HCC)    cocaine. Stopped in 2010  . Tobacco use disorder 10/07/2014    Encounter Details:  CNP Questionnaire - 11/30/20 1021      Questionnaire   Do you give verbal consent to treat you today? Yes    Visit Setting Church or Organization    Location Patient Served At [facility]    Patient Status Homeless    Medical Provider Yes    Insurance Medicaid    Intervention Support;Educate    Housing/Utilities No permanent housing          Client came to office to have his blood pressure checked. He left without taking his medication this morning. BP 151/94. Client plans to talk with his CM today about housing. He has been staying in multiple places. Reinforced need to take medication as prescribed.  Chadwin Fury W RN CN [phone]

## 2020-12-22 ENCOUNTER — Telehealth: Payer: Self-pay | Admitting: *Deleted

## 2020-12-22 ENCOUNTER — Encounter: Payer: Self-pay | Admitting: *Deleted

## 2020-12-22 NOTE — Telephone Encounter (Signed)
Client has an appt with [facility] at 11:00.

## 2020-12-22 NOTE — Congregational Nurse Program (Signed)
  Dept: [phone]   Congregational Nurse Program Note  Date of Encounter: 12/22/2020  Past Medical History: Past Medical History:  Diagnosis Date  . 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 Diabetes mellitus   . High cholesterol   . Hypertension   . Substance abuse (HCC)    cocaine. Stopped in 2010  . Tobacco use disorder 10/07/2014    Encounter Details:  CNP Questionnaire - 12/22/20 1509      Questionnaire   Do you give verbal consent to treat you today? Yes    Visit Setting Church or Organization    Location Patient Served At [facility]    Patient Status Homeless    Medical Provider Yes    Insurance Medicaid    Intervention Refer;Support    Housing/Utilities No permanent housing    Transportation Need transportation assistance    Referrals PCP - ;Other   [facility], [facility]         Client came to [facility] requesting CBG, vitals and help with appointments. Vitals 115/70 Pulse 76, CBG 231. Client had eaten within the hour. Made an appt with [facility] Feb 2nd 1:15 with Dr. Barbaraann Faster. Called [facility] and pt has an appt Britney [facility] at 11:00. Discussed transportation. Client has medicaid and has never used the transportation provided. Client agrees to Environmental manager again about transportation options before his next appointments.  Mikkel Charrette W RN  [phone]

## 2020-12-22 NOTE — Telephone Encounter (Signed)
Made an appt with [facility] Dr. Barbaraann Faster February 2nd 1:15

## 2020-12-29 ENCOUNTER — Encounter: Payer: Medicaid Other | Admitting: Internal Medicine

## 2021-01-19 ENCOUNTER — Encounter: Payer: Self-pay | Admitting: *Deleted

## 2021-01-19 NOTE — Congregational Nurse Program (Signed)
  Dept: [phone]   Congregational Nurse Program Note  Date of Encounter: 01/19/2021  Past Medical History: Past Medical History:  Diagnosis Date  . 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 Diabetes mellitus   . High cholesterol   . Hypertension   . Substance abuse (HCC)    cocaine. Stopped in 2010  . Tobacco use disorder 10/07/2014    Encounter Details:  CNP Questionnaire - 01/19/21 1105      Questionnaire   Do you give verbal consent to treat you today? Yes    Visit Setting Church or Organization    Location Patient Served At [facility]    Patient Status Homeless    Medical Provider Yes    Insurance Medicaid    Intervention Support    Housing/Utilities No permanent housing         Date of this encounter is 01/17/2021. Client came into [facility] requesting to have his blood pressure taken. 147/91. He has not taken his blood pressure medication in a few days. He said he misplaced his medication and was going to where he has been staying to look for them. Educated client on importance of blood pressure medication. Told client if they were not found today, he could alert writer and offered to assist in obtaining medication. He acknowledges understanding.  Ingram Onnen W RN CN [phone]

## 2021-01-19 NOTE — Congregational Nurse Program (Signed)
  Dept: [phone]   Congregational Nurse Program Note  Date of Encounter: 01/19/2021  Past Medical History: Past Medical History:  Diagnosis Date  . 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 Diabetes mellitus   . High cholesterol   . Hypertension   . Substance abuse (HCC)    cocaine. Stopped in 2010  . Tobacco use disorder 10/07/2014    Encounter Details:  CNP Questionnaire - 01/19/21 1122      Questionnaire   Do you give verbal consent to treat you today? Yes    Visit Setting Church or Organization    Location Patient Served At [facility]    Patient Status Homeless    Medical Provider Yes    Insurance Medicaid    Intervention Support;Educate    Housing/Utilities No permanent housing          Client seen in [facility] for CBG and blood pressure check. Client found his blood pressure medication he had misplaced and started taking again. B/P 123/78 P 67. CBG 194. Client ate "cookie crumbs" this morning. Per client, he has an upcoming appt with his medical doctor. Educated client about diabetes and blood pressure. Tonisha Silvey W RN CN [phone]

## 2021-02-02 ENCOUNTER — Encounter: Payer: Medicaid Other | Admitting: Internal Medicine

## 2021-02-08 ENCOUNTER — Ambulatory Visit: Payer: Medicaid Other | Admitting: Podiatry

## 2021-02-09 ENCOUNTER — Other Ambulatory Visit: Payer: Self-pay

## 2021-02-09 ENCOUNTER — Ambulatory Visit (INDEPENDENT_AMBULATORY_CARE_PROVIDER_SITE_OTHER): Payer: Medicaid Other | Admitting: Podiatry

## 2021-02-09 DIAGNOSIS — M79676 Pain in unspecified toe(s): Secondary | ICD-10-CM

## 2021-02-09 DIAGNOSIS — M2011 Hallux valgus (acquired), right foot: Secondary | ICD-10-CM

## 2021-02-09 DIAGNOSIS — E1142 Type 2 diabetes mellitus with diabetic polyneuropathy: Secondary | ICD-10-CM | POA: Diagnosis not present

## 2021-02-09 DIAGNOSIS — B351 Tinea unguium: Secondary | ICD-10-CM

## 2021-02-09 DIAGNOSIS — M2012 Hallux valgus (acquired), left foot: Secondary | ICD-10-CM

## 2021-02-16 ENCOUNTER — Encounter: Payer: Self-pay | Admitting: Podiatry

## 2021-02-16 NOTE — Progress Notes (Signed)
Subjective: Brady Church presents today for at risk foot care with history of diabetic neuropathy and painful thick toenails that are difficult to trim. Pain interferes with ambulation. Aggravating factors include wearing enclosed shoe gear. Pain is relieved with periodic professional debridement.  Patient did not check blood glucose this morning. He states it was 120-ish 3 weeks ago.  Brady Poll, MD is patient's PCP. Last visit was 05/12/2020.  No Known Allergies   Review of Systems: Negative except as noted in the HPI.  Objective: There were no vitals filed for this visit.  Brady Church is a pleasant 60 y.o. male in NAD. AAO X 3.  Vascular Examination: Capillary refill time to digits immediate b/l. Palpable pedal pulses b/l LE. Pedal hair sparse. Lower extremity skin temperature gradient within normal limits. No pain with calf compression b/l.  Dermatological Examination: Pedal skin with normal turgor, texture and tone bilaterally. No open wounds bilaterally. No interdigital macerations bilaterally. Toenails 1-5 b/l elongated, discolored, dystrophic, thickened, crumbly with subungual debris and tenderness to dorsal palpation.  Musculoskeletal Examination: Normal muscle strength 5/5 to all lower extremity muscle groups bilaterally. No pain crepitus or joint limitation noted with ROM b/l. Hallux valgus with bunion deformity noted b/l lower extremities.  Neurological Examination: Protective sensation diminished with 10g monofilament b/l. Vibratory sensation decreased b/l.  Hemoglobin A1C Latest Ref Rng & Units 10/05/2020 05/12/2020  HGBA1C 4.0 - 5.6 % 6.6(A) 6.4(A)  Some recent data might be hidden   Assessment: 1. Pain due to onychomycosis of toenail   2. Hallux valgus, acquired, bilateral   3. Diabetic peripheral neuropathy associated with type 2 diabetes mellitus (HCC)     Plan: -Examined patient. -No new findings. No new orders. -Continue diabetic foot care  principles. -Patient to continue soft, supportive shoe gear daily. -Toenails 1-5 b/l were debrided in length and girth with sterile nail nippers and dremel without iatrogenic bleeding.  -Patient to report any pedal injuries to medical professional immediately. -Patient/POA to call should there be question/concern in the interim.  Return in about 3 months (around 05/12/2021).  Brady Church, DPM

## 2021-02-23 ENCOUNTER — Encounter: Payer: Medicaid Other | Admitting: Internal Medicine

## 2021-03-16 ENCOUNTER — Telehealth: Payer: Self-pay | Admitting: *Deleted

## 2021-03-16 ENCOUNTER — Encounter: Payer: Self-pay | Admitting: *Deleted

## 2021-03-16 NOTE — Telephone Encounter (Signed)
Called to verify appt 04/19/21 8:45 with [facility]

## 2021-03-16 NOTE — Telephone Encounter (Signed)
Called to verify appt 04/13/21 10:15 Dr Antony Contras.

## 2021-03-16 NOTE — Congregational Nurse Program (Signed)
  Dept: [phone]   Congregational Nurse Program Note  Date of Encounter: 03/16/2021  Past Medical History: Past Medical History:  Diagnosis Date  . 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 Diabetes mellitus   . High cholesterol   . Hypertension   . Substance abuse (HCC)    cocaine. Stopped in 2010  . Tobacco use disorder 10/07/2014    Encounter Details:  CNP Questionnaire - 03/16/21 1345      Questionnaire   Do you give verbal consent to treat you today? Yes    Visit Setting Church or Organization    Location Patient Served At [facility]    Patient Status Homeless    Medical Provider Yes    Insurance Medicaid    Intervention Refer;Support;Educate   [facility]   Housing/Utilities No permanent housing    Transportation Provided transportation assistance ([facility] transp,bus pass, taxi voucher, etc.)    Referrals PCP - [place]          Client came in [facility] requesting help with transportation to [facility] office. Gave a bus pass. Checked vitals and CBG. CBG 255. Client reports he recently drank a Cheerwine and ate a Snickers. He reports he used to take Metformin but the doctor took him off and he has not ate candy or soda like today in two months. Discussed diabetes education with client. He asked for writer to check on his upcoming appointments with MD. [facility] and he has an appt 04/13/21 with Dr Antony Contras at 10:15. Called [facility] and he has an appt 04/19/21 at 8:45. Celena Lanius W RN CN [phone]

## 2021-03-23 ENCOUNTER — Encounter: Payer: Self-pay | Admitting: *Deleted

## 2021-03-23 NOTE — Congregational Nurse Program (Signed)
  Dept: [phone]   Congregational Nurse Program Note  Date of Encounter: 03/23/2021  Past Medical History: Past Medical History:  Diagnosis Date  . 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 Diabetes mellitus   . High cholesterol   . Hypertension   . Substance abuse (HCC)    cocaine. Stopped in 2010  . Tobacco use disorder 10/07/2014    Encounter Details:  CNP Questionnaire - 03/23/21 1421      Questionnaire   Do you give verbal consent to treat you today? Yes    Visit Setting Church or Organization    Location Patient Served At [facility]    Patient Status Homeless    Medical Provider Yes    Insurance Medicaid    Intervention Support;Educate;Refer    Housing/Utilities No permanent housing    Referrals PCP - [place]          Client seen at [facility]. He had vitals and cbg check. 146/90 and cbg 197. PCP appt is scheduled for May. Educated about HTN and diabetes. Pt has no s/s of distress.  Hart Haas W RN CN [phone]

## 2021-03-24 DIAGNOSIS — Z20822 Contact with and (suspected) exposure to covid-19: Secondary | ICD-10-CM | POA: Diagnosis not present

## 2021-03-25 ENCOUNTER — Other Ambulatory Visit: Payer: Self-pay | Admitting: Student

## 2021-03-25 DIAGNOSIS — N529 Male erectile dysfunction, unspecified: Secondary | ICD-10-CM

## 2021-03-25 NOTE — Telephone Encounter (Signed)
Next appt scheduled 04/13/21 with PCP.

## 2021-03-25 NOTE — Telephone Encounter (Signed)
Refill for sildenafil declined. Patient needs to be seen, he's no showed his last three office visits. I will prescribe if he comes to his next appointment.

## 2021-03-30 ENCOUNTER — Encounter: Payer: Self-pay | Admitting: *Deleted

## 2021-03-30 NOTE — Congregational Nurse Program (Signed)
  Dept: [phone]   Congregational Nurse Program Note  Date of Encounter: 03/30/2021  Past Medical History: Past Medical History:  Diagnosis Date  . 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 Diabetes mellitus   . High cholesterol   . Hypertension   . Substance abuse (HCC)    cocaine. Stopped in 2010  . Tobacco use disorder 10/07/2014    Encounter Details:  CNP Questionnaire - 03/30/21 1237      Questionnaire   Do you give verbal consent to treat you today? Yes    Visit Setting Church or Organization    Location Patient Served At [facility]    Patient Status Homeless    Medical Provider Yes    Insurance Medicaid    Intervention Assess (including screenings);Support   BP and CBG   Housing/Utilities No permanent housing          Client seen at [facility] for vitals and cbg check. Vitals 161/92 pulse 74 and cbg 156. Client has not taken his blood pressure medication today. Talked with client about elevated blood pressure and instructed client he needed to take his medication. Client said he was going to go try to find his medicine. Instructed client to alert wrier if he could not locate his medication so writer could assist client with getting his medicine. He acknowledges understanding.  Kayde Atkerson W RN CN [phone]

## 2021-04-07 ENCOUNTER — Other Ambulatory Visit: Payer: Self-pay | Admitting: Student

## 2021-04-07 DIAGNOSIS — N529 Male erectile dysfunction, unspecified: Secondary | ICD-10-CM

## 2021-04-07 NOTE — Telephone Encounter (Addendum)
FO received call from [facility] pharmacist stating patient is refusing to leave until he receives this med. Spoke with PCP. Patient has had 4 no shows and 3 cancellations in last year. PCP will refill this med but if he does not show for his appt on 04/13/21 she will send him a dismissal letter. Attempted to notify patient. No answer and [facility] is not set up.

## 2021-04-13 ENCOUNTER — Encounter: Payer: Self-pay | Admitting: Internal Medicine

## 2021-04-13 ENCOUNTER — Ambulatory Visit (INDEPENDENT_AMBULATORY_CARE_PROVIDER_SITE_OTHER): Payer: Medicaid Other | Admitting: Internal Medicine

## 2021-04-13 ENCOUNTER — Other Ambulatory Visit: Payer: Self-pay

## 2021-04-13 ENCOUNTER — Encounter: Payer: Self-pay | Admitting: *Deleted

## 2021-04-13 ENCOUNTER — Telehealth: Payer: Self-pay | Admitting: *Deleted

## 2021-04-13 VITALS — BP 143/88 | HR 72 | Temp 97.7°F | Ht 68.0 in | Wt 211.5 lb

## 2021-04-13 DIAGNOSIS — E538 Deficiency of other specified B group vitamins: Secondary | ICD-10-CM | POA: Diagnosis not present

## 2021-04-13 DIAGNOSIS — I10 Essential (primary) hypertension: Secondary | ICD-10-CM | POA: Diagnosis not present

## 2021-04-13 DIAGNOSIS — I63512 Cerebral infarction due to unspecified occlusion or stenosis of left middle cerebral artery: Secondary | ICD-10-CM

## 2021-04-13 DIAGNOSIS — F1010 Alcohol abuse, uncomplicated: Secondary | ICD-10-CM

## 2021-04-13 DIAGNOSIS — G6289 Other specified polyneuropathies: Secondary | ICD-10-CM

## 2021-04-13 DIAGNOSIS — E785 Hyperlipidemia, unspecified: Secondary | ICD-10-CM | POA: Diagnosis not present

## 2021-04-13 DIAGNOSIS — E0836 Diabetes mellitus due to underlying condition with diabetic cataract: Secondary | ICD-10-CM

## 2021-04-13 DIAGNOSIS — N529 Male erectile dysfunction, unspecified: Secondary | ICD-10-CM

## 2021-04-13 DIAGNOSIS — R0982 Postnasal drip: Secondary | ICD-10-CM | POA: Diagnosis not present

## 2021-04-13 LAB — POCT GLYCOSYLATED HEMOGLOBIN (HGB A1C): Hemoglobin A1C: 6.6 % — AB (ref 4.0–5.6)

## 2021-04-13 LAB — GLUCOSE, CAPILLARY: Glucose-Capillary: 152 mg/dL — ABNORMAL HIGH (ref 70–99)

## 2021-04-13 MED ORDER — FLUTICASONE PROPIONATE 50 MCG/ACT NA SUSP: 1.0000 | Freq: Every day | NASAL | 1 refills | Status: DC

## 2021-04-13 MED ORDER — GABAPENTIN 300 MG PO CAPS: 300.0000 mg | ORAL_CAPSULE | Freq: Three times a day (TID) | ORAL | 5 refills | Status: DC

## 2021-04-13 MED ORDER — MULTIVITAMIN-MINERALS PO TABS: 1.0000 | ORAL_TABLET | Freq: Every day | ORAL | 5 refills | Status: DC

## 2021-04-13 MED ORDER — ASPIRIN 81 MG PO TBEC: 81.0000 mg | DELAYED_RELEASE_TABLET | Freq: Every day | ORAL | 5 refills | Status: DC

## 2021-04-13 MED ORDER — FOLIC ACID 1 MG PO TABS: 1.0000 mg | ORAL_TABLET | Freq: Every day | ORAL | 5 refills | Status: DC

## 2021-04-13 MED ORDER — VITAMIN B-1 100 MG PO TABS: 100.0000 mg | ORAL_TABLET | Freq: Every day | ORAL | 5 refills | Status: DC

## 2021-04-13 MED ORDER — HYDROCHLOROTHIAZIDE 25 MG PO TABS: 25.0000 mg | ORAL_TABLET | Freq: Every day | ORAL | 5 refills | Status: DC

## 2021-04-13 MED ORDER — LISINOPRIL 40 MG PO TABS: 40.0000 mg | ORAL_TABLET | Freq: Every day | ORAL | 5 refills | Status: DC

## 2021-04-13 MED ORDER — ATORVASTATIN CALCIUM 80 MG PO TABS: ORAL_TABLET | ORAL | 5 refills | Status: DC

## 2021-04-13 NOTE — Patient Instructions (Addendum)
Brady Church,  It was a pleasure to see you today. Today we discussed:  Blood Pressure: Please continue your lisinopril and restart your hydrochlorothiazide.   Diabetes: Your hemoglobin A1c is 6.6. Keep up the great work with your diet  Cholesterol: Please restart your atorvastatin  Alcohol Use: Continue to work on cutting back. Please restart your thiamine, folate, and multivitamin supplements.   Vitamin B12 Deficiency: I am rechecking your levels. I will call you if we need to restart supplements.   Follow up with me again in 6 months or sooner if needed. If you have any questions or concerns, call our clinic at [phone] or after hours call [phone] and ask for the internal medicine resident on call. Thank you!  Dr. Antony Contras

## 2021-04-13 NOTE — Progress Notes (Signed)
Subjective:   Patient ID: Brady Church male   DOB: 1961-11-07 60 y.o.   MRN: [id number]  HPI: Brady Church is a 60 y.o. male with past medical history outlined below here for follow up of his chronic medical conditions. For the details of today's visit, please refer to the assessment and plan.   Past Medical History:  Diagnosis Date  . 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 Diabetes mellitus   . High cholesterol   . Hypertension   . Substance abuse (HCC)    cocaine. Stopped in 2010  . Tobacco use disorder 10/07/2014   Current Outpatient Medications  Medication Sig Dispense Refill  . fluticasone (FLONASE) 50 MCG/ACT nasal spray Place 1 spray into both nostrils daily. 16 g 1  . Multiple Vitamins-Minerals (MULTIVITAMIN-MINERALS) TABS Take 1 tablet by mouth daily. 30 tablet 5  . ALPHAGAN P 0.1 % SOLN INSTILL 1 DROP INTO EACH EYE TWICE DAILY 5 mL 5  . aspirin 81 MG EC tablet Take 1 tablet (81 mg total) by mouth daily. 30 tablet 5  . atorvastatin (LIPITOR) 80 MG tablet TAKE 1 TABLET BY MOUTH ONCE DAILY AT  6  PM 30 tablet 5  . dorzolamide-timolol (COSOPT) 22.3-6.8 MG/ML ophthalmic solution Place 1 drop into the right eye 2 (two) times daily. 10 mL 5  . fluorometholone (FML) 0.1 % ophthalmic suspension INSTILL 1 DROP INTO EACH EYE ONCE DAILY 5 mL 5  . folic acid (FOLVITE) 1 MG tablet Take 1 tablet (1 mg total) by mouth daily. 30 tablet 5  . gabapentin (NEURONTIN) 300 MG capsule Take 1 capsule (300 mg total) by mouth 3 (three) times daily. 90 capsule 5  . hydrochlorothiazide (HYDRODIURIL) 25 MG tablet Take 1 tablet (25 mg total) by mouth daily. 30 tablet 5  . latanoprost (XALATAN) 0.005 % ophthalmic solution     . lisinopril (ZESTRIL) 40 MG tablet Take 1 tablet (40 mg total) by mouth daily. 30 tablet 5  . sildenafil (REVATIO) 20 MG tablet TAKE 2-5 TABLETS BY MOUTH AS NEEDED 30 MINUTES PRIOR TO SEXUAL ACTIVITY 20 tablet 0  . thiamine (VITAMIN B-1) 100 MG  tablet Take 1 tablet (100 mg total) by mouth daily. 30 tablet 5   No current facility-administered medications for this visit.   Family History  Problem Relation Age of Onset  . Coronary artery disease Mother 44       died of MI  . Cancer Father 59       Some GI cancer. Not sure if it was Colon Cancer or not.  . Hypertension Father   . Diabetes Maternal Grandmother    Social History   Socioeconomic History  . Marital status: Single    Spouse name: Not on file  . Number of children: Not on file  . Years of education: 62  . Highest education level: Not on file  Occupational History  . Not on file  Tobacco Use  . Smoking status: Former Smoker    Packs/day: 0.50    Types: Cigarettes    Quit date: 02/18/2017    Years since quitting: 4.1  . Smokeless tobacco: Never Used  . Tobacco comment: Quit over 1 year ago.  Substance and Sexual Activity  . Alcohol use: Yes    Alcohol/week: 0.0 standard drinks    Comment: Beer- 32 oz. every day.  . Drug use: Yes    Types: Cocaine, Marijuana    Comment: Sometimes.  Marland Kitchen  Sexual activity: Not on file  Other Topics Concern  . Not on file  Social History Narrative   Lives in [place] for last 10 years with "friend"  [name]      Is not married and does not have kids.   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   Social Determinants of Health   Financial Resource Strain: Not on file  Food Insecurity: Not on file  Transportation Needs: Not on file  Physical Activity: Not on file  Stress: Not on file  Social Connections: Not on file    Review of Systems: Review of Systems  Respiratory: Negative for shortness of breath.   Cardiovascular: Negative for chest pain.     Objective:  Physical Exam:  Vitals:   04/13/21 1026  BP: (!) 143/88  Pulse: 72  Temp: 97.7 F (36.5 C)  TempSrc: Oral  SpO2: 100%  Weight: 211 lb 8 oz (95.9 kg)  Height: 5\' 8"  (1.727 m)     Physical Exam Constitutional:      Appearance: Normal appearance.  Cardiovascular:     Rate and Rhythm: Normal rate and regular rhythm.     Heart sounds: Normal heart sounds.  Pulmonary:     Effort: Pulmonary effort is normal. No respiratory distress.     Breath sounds: Normal breath sounds.  Musculoskeletal:     Right lower leg: No edema.     Left lower leg: No edema.  Skin:    General: Skin is warm and dry.  Neurological:     Mental Status: He is alert.  Psychiatric:        Mood and Affect: Mood normal.        Behavior: Behavior normal.      Assessment & Plan:   See Encounters Tab for problem based charting.

## 2021-04-13 NOTE — Congregational Nurse Program (Signed)
  Dept: [phone]   Congregational Nurse Program Note  Date of Encounter: 04/13/2021  Past Medical History: Past Medical History:  Diagnosis Date  . 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 Diabetes mellitus   . High cholesterol   . Hypertension   . Substance abuse (HCC)    cocaine. Stopped in 2010  . Tobacco use disorder 10/07/2014    Encounter Details:  CNP Questionnaire - 04/13/21 0900      Questionnaire   Do you give verbal consent to treat you today? Yes    Visit Setting Church or Organization    Location Patient Served At [facility]    Patient Status Homeless    Medical Provider Yes    Insurance Medicaid    Intervention Support;Advocate    Housing/Utilities No permanent housing    Transportation Provided transportation assistance ([facility] transp,bus pass, taxi voucher, etc.)    Referrals PCP - [facility]          Client came into [facility] requesting a blood pressure check and cbg. Vitals 130/80 pulse 90 cbg 244. Client has an appt with Dr Hessie Dibble [facility] at 10:00. He requested help with transportation. Called [facility] transportation and scheduled a ride for the client to his doctor's visit.  Wessie Shanks W RN CN [phone]

## 2021-04-13 NOTE — Telephone Encounter (Signed)
Called [employer] and scheduled a ride for client to his doctor's appt at [facility].

## 2021-04-14 ENCOUNTER — Encounter: Payer: Self-pay | Admitting: Internal Medicine

## 2021-04-14 LAB — LIPID PANEL
Chol/HDL Ratio: 3.3 ratio (ref 0.0–5.0)
Cholesterol, Total: 184 mg/dL (ref 100–199)
HDL: 55 mg/dL (ref >39)
LDL Chol Calc (NIH): 104 mg/dL — ABNORMAL HIGH (ref 0–99)
Triglycerides: 140 mg/dL (ref 0–149)
VLDL Cholesterol Cal: 25 mg/dL (ref 5–40)

## 2021-04-14 LAB — VITAMIN B12: Vitamin B-12: 392 pg/mL (ref 232–1245)

## 2021-04-14 LAB — BMP8+ANION GAP
Anion Gap: 16 mmol/L (ref 10.0–18.0)
BUN/Creatinine Ratio: 17 (ref 9–20)
BUN: 17 mg/dL (ref 6–24)
CO2: 23 mmol/L (ref 20–29)
Calcium: 9.4 mg/dL (ref 8.7–10.2)
Chloride: 101 mmol/L (ref 96–106)
Creatinine, Ser: 1 mg/dL (ref 0.76–1.27)
Glucose: 120 mg/dL — ABNORMAL HIGH (ref 65–99)
Potassium: 4.1 mmol/L (ref 3.5–5.2)
Sodium: 140 mmol/L (ref 134–144)
eGFR: 87 mL/min/{1.73_m2} (ref >59)

## 2021-04-14 NOTE — Assessment & Plan Note (Signed)
Uncontrolled, but has been off all his medication except for lisinopril and aspirin. Plan to restart HCTZ today. Checking BMP.

## 2021-04-14 NOTE — Assessment & Plan Note (Signed)
Has been off his medications. Restarting atorvastatin. Checking lipid panel.

## 2021-04-14 NOTE — Assessment & Plan Note (Signed)
Continues to drink beer daily. Restart multivitamin, folate, and thiamine supplements.

## 2021-04-14 NOTE — Assessment & Plan Note (Signed)
Currently off his supplementation. Rechecking Vitamin B12 levels.

## 2021-04-14 NOTE — Assessment & Plan Note (Signed)
He has chronic RUE paresthesias from his prior stroke as well as DM neuropathy. Restarting gabapentin 300 mg TID.

## 2021-04-14 NOTE — Assessment & Plan Note (Signed)
Continues to be diet controlled. Monitor Hgb A1c. Follows with ophthalmology for his glaucoma, should have already had his retinopathy screening. Will request records.

## 2021-04-14 NOTE — Assessment & Plan Note (Signed)
Recently refilled sildenafil PRN per request. This works well for him.

## 2021-04-14 NOTE — Assessment & Plan Note (Signed)
Uncontrolled, restart flonase.

## 2021-04-18 ENCOUNTER — Telehealth: Payer: Self-pay | Admitting: *Deleted

## 2021-04-18 ENCOUNTER — Encounter: Payer: Self-pay | Admitting: *Deleted

## 2021-04-18 NOTE — Congregational Nurse Program (Signed)
  Dept: [phone]   Congregational Nurse Program Note  Date of Encounter: 04/18/2021  Past Medical History: Past Medical History:  Diagnosis Date  . 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 Diabetes mellitus   . High cholesterol   . Hypertension   . Substance abuse (HCC)    cocaine. Stopped in 2010  . Tobacco use disorder 10/07/2014    Encounter Details:  CNP Questionnaire - 04/18/21 1507      Questionnaire   Do you give verbal consent to treat you today? Yes    Visit Setting Church or Organization    Location Patient Served At [facility]    Patient Status Homeless    Medical Provider Yes    Insurance Medicaid    Intervention Support    Housing/Utilities No permanent housing          Client came in to have his blood pressure checked. He reports he has not had his medication today and thinks his medication may have been stolen. [facility] on [address] twice to inquire about medication. They report he cannot get any refills until June 6th unless he pays over $60. Client says he is going to look a few more places. Explained importance of blood pressure medication and he acknowledges understanding. Kassiah Mccrory W RN CN [phone]

## 2021-04-18 NOTE — Telephone Encounter (Signed)
Called pharmacy x2. First call out for lunch. 2nd call, per pharmacy, client cannot get a refill until June 6th for blood pressure medication unless he pays over $60.

## 2021-04-22 ENCOUNTER — Encounter: Payer: Self-pay | Admitting: *Deleted

## 2021-04-22 NOTE — Congregational Nurse Program (Signed)
  Dept: [phone]   Congregational Nurse Program Note  Date of Encounter: 04/22/2021  Past Medical History: Past Medical History:  Diagnosis Date  . 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 Diabetes mellitus   . High cholesterol   . Hypertension   . Substance abuse (HCC)    cocaine. Stopped in 2010  . Tobacco use disorder 10/07/2014    Encounter Details:  CNP Questionnaire - 04/22/21 1237      Questionnaire   Do you give verbal consent to treat you today? Yes    Visit Setting Church or Organization    Location Patient Served At [facility]    Patient Status Homeless    Medical Provider Yes    Insurance Medicaid    Intervention Support    Housing/Utilities No permanent housing          Client came in [facility] office requesting blood pressure check and CBG. He comes by often for maintenance checking. Client often misplaces his medication and reports he currently has his medication. Vitals 134/83 pulse 80 CBG 172. Offered encouragement and support. Jameca Chumley W RN CN [phone]

## 2021-04-23 ENCOUNTER — Encounter (HOSPITAL_COMMUNITY): Payer: Self-pay | Admitting: Emergency Medicine

## 2021-04-23 ENCOUNTER — Emergency Department (HOSPITAL_COMMUNITY)
Admission: EM | Admit: 2021-04-23 | Discharge: 2021-04-23 | Disposition: A | Discharge status: Home / Self Care | Payer: Medicaid Other | Attending: Emergency Medicine | Admitting: Emergency Medicine

## 2021-04-23 ENCOUNTER — Other Ambulatory Visit: Payer: Self-pay

## 2021-04-23 DIAGNOSIS — E119 Type 2 diabetes mellitus without complications: Secondary | ICD-10-CM | POA: Diagnosis not present

## 2021-04-23 DIAGNOSIS — I10 Essential (primary) hypertension: Secondary | ICD-10-CM | POA: Diagnosis not present

## 2021-04-23 DIAGNOSIS — I5042 Chronic combined systolic (congestive) and diastolic (congestive) heart failure: Secondary | ICD-10-CM | POA: Diagnosis not present

## 2021-04-23 DIAGNOSIS — Z79899 Other long term (current) drug therapy: Secondary | ICD-10-CM | POA: Insufficient documentation

## 2021-04-23 DIAGNOSIS — Z7982 Long term (current) use of aspirin: Secondary | ICD-10-CM | POA: Diagnosis not present

## 2021-04-23 DIAGNOSIS — R1111 Vomiting without nausea: Secondary | ICD-10-CM | POA: Diagnosis not present

## 2021-04-23 DIAGNOSIS — G4489 Other headache syndrome: Secondary | ICD-10-CM | POA: Diagnosis not present

## 2021-04-23 DIAGNOSIS — R110 Nausea: Secondary | ICD-10-CM | POA: Diagnosis not present

## 2021-04-23 DIAGNOSIS — Z87891 Personal history of nicotine dependence: Secondary | ICD-10-CM | POA: Insufficient documentation

## 2021-04-23 DIAGNOSIS — I110 Hypertensive heart disease with heart failure: Secondary | ICD-10-CM | POA: Insufficient documentation

## 2021-04-23 DIAGNOSIS — M791 Myalgia, unspecified site: Secondary | ICD-10-CM | POA: Diagnosis present

## 2021-04-23 DIAGNOSIS — U071 COVID-19: Secondary | ICD-10-CM | POA: Insufficient documentation

## 2021-04-23 LAB — COMPREHENSIVE METABOLIC PANEL
ALT: 24 U/L (ref 0–44)
AST: 30 U/L (ref 15–41)
Albumin: 3.9 g/dL (ref 3.5–5.0)
Alkaline Phosphatase: 62 U/L (ref 38–126)
Anion gap: 10 (ref 5–15)
BUN: 10 mg/dL (ref 6–20)
CO2: 27 mmol/L (ref 22–32)
Calcium: 9.1 mg/dL (ref 8.9–10.3)
Chloride: 99 mmol/L (ref 98–111)
Creatinine, Ser: 0.94 mg/dL (ref 0.61–1.24)
GFR, Estimated: >60 mL/min (ref >60)
Glucose, Bld: 149 mg/dL — ABNORMAL HIGH (ref 70–99)
Potassium: 3.5 mmol/L (ref 3.5–5.1)
Sodium: 136 mmol/L (ref 135–145)
Total Bilirubin: 0.6 mg/dL (ref 0.3–1.2)
Total Protein: 7.5 g/dL (ref 6.5–8.1)

## 2021-04-23 LAB — URINALYSIS, ROUTINE W REFLEX MICROSCOPIC
Bacteria, UA: NONE SEEN
Bilirubin Urine: NEGATIVE
Glucose, UA: NEGATIVE mg/dL
Ketones, ur: NEGATIVE mg/dL
Leukocytes,Ua: NEGATIVE
Nitrite: NEGATIVE
Protein, ur: NEGATIVE mg/dL
Specific Gravity, Urine: 1.006 (ref 1.005–1.030)
pH: 6 (ref 5.0–8.0)

## 2021-04-23 LAB — CBC WITH DIFFERENTIAL/PLATELET
Abs Immature Granulocytes: 0.03 10*3/uL (ref 0.00–0.07)
Basophils Absolute: 0 10*3/uL (ref 0.0–0.1)
Basophils Relative: 0 %
Eosinophils Absolute: 0.2 10*3/uL (ref 0.0–0.5)
Eosinophils Relative: 3 %
HCT: 39.9 % (ref 39.0–52.0)
Hemoglobin: 13.8 g/dL (ref 13.0–17.0)
Immature Granulocytes: 1 %
Lymphocytes Relative: 9 %
Lymphs Abs: 0.6 10*3/uL — ABNORMAL LOW (ref 0.7–4.0)
MCH: 31.3 pg (ref 26.0–34.0)
MCHC: 34.6 g/dL (ref 30.0–36.0)
MCV: 90.5 fL (ref 80.0–100.0)
Monocytes Absolute: 0.7 10*3/uL (ref 0.1–1.0)
Monocytes Relative: 11 %
Neutro Abs: 4.6 10*3/uL (ref 1.7–7.7)
Neutrophils Relative %: 76 %
Platelets: 265 10*3/uL (ref 150–400)
RBC: 4.41 MIL/uL (ref 4.22–5.81)
RDW: 15.1 % (ref 11.5–15.5)
WBC: 6.1 10*3/uL (ref 4.0–10.5)
nRBC: 0 % (ref 0.0–0.2)

## 2021-04-23 LAB — RESP PANEL BY RT-PCR (FLU A&B, COVID) ARPGX2
Influenza A by PCR: NEGATIVE
Influenza B by PCR: NEGATIVE
SARS Coronavirus 2 by RT PCR: POSITIVE — AB

## 2021-04-23 LAB — LIPASE, BLOOD: Lipase: 27 U/L (ref 11–51)

## 2021-04-23 MED ORDER — MECLIZINE HCL 25 MG PO TABS: 25.0000 mg | ORAL_TABLET | Freq: Three times a day (TID) | ORAL | 0 refills | Status: DC | PRN

## 2021-04-23 MED ORDER — ONDANSETRON 4 MG PO TBDP
4.0000 mg | ORAL_TABLET | Freq: Once | ORAL | Status: AC
  Administered 2021-04-23: 4 mg via ORAL
  Filled 2021-04-23: qty 1

## 2021-04-23 MED ORDER — ACETAMINOPHEN 325 MG PO TABS
650.0000 mg | ORAL_TABLET | Freq: Once | ORAL | Status: AC
  Administered 2021-04-23: 650 mg via ORAL
  Filled 2021-04-23: qty 2

## 2021-04-23 MED ORDER — DEXAMETHASONE SODIUM PHOSPHATE 10 MG/ML IJ SOLN
10.0000 mg | Freq: Once | INTRAMUSCULAR | Status: AC
  Administered 2021-04-23: 10 mg via INTRAMUSCULAR
  Filled 2021-04-23: qty 1

## 2021-04-23 NOTE — ED Triage Notes (Addendum)
Pt to triage via [facility].  Homeless.  Reports generalized abd pain, nausea, vomiting, diarrhea, and headache since last night.  States he drank 2 beers last night and ate a sandwich that may have made him sick.

## 2021-04-23 NOTE — Discharge Instructions (Signed)
You were diagnosed with COVID 19 infection. You may take Motrin and Tylenol for treatment of your symptoms.  You may use Imodium for diarrhea relief.   I am also discharging you with medication for nausea.  Return to the emergency department immediately if you are unable to hold down any food or fluid develop shortness of breath.

## 2021-04-23 NOTE — ED Provider Notes (Signed)
Emergency Medicine Provider Triage Evaluation Note  Brady Church , a 60 y.o. male  was evaluated in triage.  Pt complains of flu like sxs since this am. HA, N/V/D. Thinks he ate a bad sandwhich  Review of Systems  Positive: abd pain Negative: Chest pain  Physical Exam  There were no vitals taken for this visit. Gen:   Awake, no distress   Resp:  Normal effort  MSK:   Moves extremities without difficulty  Other:  Groaning a lot. Generalized abd tenderness  Medical Decision Making  Medically screening exam initiated at 7:59 AM.  Appropriate orders placed.  Brady Church was informed that the remainder of the evaluation will be completed by another provider, this initial triage assessment does not replace that evaluation, and the importance of remaining in the ED until their evaluation is complete.  Flu like sxs- labs, meds ordered   Brady Captain, PA-C 04/23/21 0802    Brady Logan, DO 04/23/21 1518

## 2021-04-23 NOTE — ED Provider Notes (Signed)
[facility] EMERGENCY DEPARTMENT Provider Note   CSN: [id number] Arrival date & time: 04/23/21  0736     History Chief Complaint  Patient presents with  . Vomiting  . Diarrhea    Brady Church is a 60 y.o. male since emergency department with flulike symptoms.  Presents with onset of body aches, chills, nausea, vomiting, some diarrhea, cough.  He thinks it may be due to eating a bad sandwich last night.  He is depressed medical history of substance abuse some alcoholic misuse disorder.  Patient did not take any medications prior to arrival  HPI     Past Medical History:  Diagnosis Date  . 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 Diabetes mellitus   . High cholesterol   . Hypertension   . Substance abuse (HCC)    cocaine. Stopped in 2010  . Tobacco use disorder 10/07/2014    Patient Active Problem List   Diagnosis Date Noted  . Shingles 10/05/2020  . Vitamin B12 deficiency 08/06/2019  . Erectile dysfunction 05/06/2018  . Post-nasal drip 10/01/2017  . Peripheral neuropathy 08/06/2017  . Adjustment disorder with mixed anxiety and depressed mood   . CVA (cerebral vascular accident) (HCC) 02/19/2017  . Intracranial vascular stenosis 02/16/2017  . History of substance abuse (HCC) 02/16/2017  . Prolonged QT interval 02/16/2017  . Chronic combined systolic and diastolic congestive heart failure (HCC) 05/12/2015  . Stable angina (HCC) 01/07/2015  . Alcohol abuse 11/06/2014  . History of tobacco use 10/07/2014  . Healthcare maintenance 01/23/2014  . Glaucoma 05/11/2012  . Hyperlipidemia LDL goal <100 03/22/2012  . Hypertension 01/17/2012  . Diabetes mellitus with diabetic cataract (HCC) 03/08/2009    Past Surgical History:  Procedure Laterality Date  . CATARACT EXTRACTION  01/08/2012   Right eye  . EYE SURGERY     x 4 total        Family History  Problem Relation Age of Onset  . Coronary artery disease Mother 55        died of MI  . Cancer Father 2       Some GI cancer. Not sure if it was Colon Cancer or not.  . Hypertension Father   . Diabetes Maternal Grandmother     Social History   Tobacco Use  . Smoking status: Former Smoker    Packs/day: 0.50    Types: Cigarettes    Quit date: 02/18/2017    Years since quitting: 4.1  . Smokeless tobacco: Never Used  . Tobacco comment: Quit over 1 year ago.  Substance Use Topics  . Alcohol use: Yes    Alcohol/week: 0.0 standard drinks    Comment: Beer- 32 oz. every day.  . Drug use: Yes    Types: Cocaine, Marijuana    Comment: Sometimes.    Home Medications Prior to Admission medications   Medication Sig Start Date End Date Taking? Authorizing Provider  ALPHAGAN P 0.1 % SOLN INSTILL 1 DROP INTO EACH EYE TWICE DAILY 10/05/20   Remo Lipps, MD  aspirin 81 MG EC tablet Take 1 tablet (81 mg total) by mouth daily. 04/13/21   [name], MD  atorvastatin (LIPITOR) 80 MG tablet TAKE 1 TABLET BY MOUTH ONCE DAILY AT  6  PM 04/13/21   [name], MD  dorzolamide-timolol (COSOPT) 22.3-6.8 MG/ML ophthalmic solution Place 1 drop into the right eye 2 (two) times daily. 10/05/20   Remo Lipps, MD  fluorometholone (FML) 0.1 % ophthalmic suspension INSTILL 1 DROP INTO [facility] EYE ONCE DAILY 10/05/20   Remo Lipps, MD  fluticasone [facility]) 50 MCG/ACT nasal spray Place 1 spray into both nostrils daily. 04/13/21   [name], MD  folic acid (FOLVITE) 1 MG tablet Take 1 tablet (1 mg total) by mouth daily. 04/13/21   [name], MD  gabapentin (NEURONTIN) 300 MG capsule Take 1 capsule (300 mg total) by mouth 3 (three) times daily. 04/13/21   [name], MD  hydrochlorothiazide (HYDRODIURIL) 25 MG tablet Take 1 tablet (25 mg total) by mouth daily. 04/13/21   [name], MD  latanoprost (XALATAN) 0.005 % ophthalmic solution  12/20/20   [provider]  lisinopril (ZESTRIL) 40 MG tablet Take 1 tablet (40 mg total) by mouth daily.  04/13/21   [name], MD  Multiple Vitamins-Minerals (MULTIVITAMIN-MINERALS) TABS Take 1 tablet by mouth daily. 04/13/21   [name], MD  sildenafil (REVATIO) 20 MG tablet TAKE 2-5 TABLETS BY MOUTH AS NEEDED 30 MINUTES PRIOR TO SEXUAL ACTIVITY 04/07/21   [name], MD  thiamine (VITAMIN B-1) 100 MG tablet Take 1 tablet (100 mg total) by mouth daily. 04/13/21   [name], MD    Allergies    Patient has no known allergies.  Review of Systems   Review of Systems Ten systems reviewed and are negative for acute change, except as noted in the HPI.   Physical Exam Updated Vital Signs BP (!) 149/85 (BP Location: Left Arm)   Pulse 83   Temp 98.9 F (37.2 C) (Oral)   Resp 18   SpO2 100%   Physical Exam Vitals and nursing note reviewed.  Constitutional:      General: He is not in acute distress.    Appearance: He is well-developed. He is ill-appearing. He is not toxic-appearing or diaphoretic.  HENT:     Head: Normocephalic and atraumatic.  Eyes:     General: No scleral icterus.    Conjunctiva/sclera: Conjunctivae normal.  Cardiovascular:     Rate and Rhythm: Normal rate and regular rhythm.     Heart sounds: Normal heart sounds.  Pulmonary:     Effort: Pulmonary effort is normal. No respiratory distress.     Breath sounds: Normal breath sounds.  Abdominal:     Palpations: Abdomen is soft.     Tenderness: There is abdominal tenderness (Generalized abdominal tenderness).  Musculoskeletal:     Cervical back: Normal range of motion and neck supple.  Skin:    General: Skin is warm and dry.  Neurological:     Mental Status: He is alert.  Psychiatric:        Behavior: Behavior normal.     ED Results / Procedures / Treatments   Labs (all labs ordered are listed, but only abnormal results are displayed) Labs Reviewed  RESP PANEL BY RT-PCR (FLU A&B, COVID) ARPGX2 - Abnormal; Notable for the following components:      Result Value   SARS Coronavirus 2  by RT PCR POSITIVE (*)    All other components within normal limits  COMPREHENSIVE METABOLIC PANEL - Abnormal; Notable for the following components:   Glucose, Bld 149 (*)    All other components within normal limits  CBC WITH DIFFERENTIAL/PLATELET - Abnormal; Notable for the following components:   Lymphs Abs 0.6 (*)    All other components within normal limits  LIPASE, BLOOD  URINALYSIS, ROUTINE W REFLEX MICROSCOPIC    EKG None  Radiology No results found.  Procedures Procedures   Medications Ordered in ED Medications  ondansetron (ZOFRAN-ODT) disintegrating tablet 4 mg (4 mg Oral Given 04/23/21 0815)  acetaminophen (TYLENOL) tablet 650 mg (650 mg Oral Given 04/23/21 0815)    ED Course  I have reviewed the triage vital signs and the nursing notes.  Pertinent labs & imaging results that were available during my care of the patient were reviewed by me and considered in my medical decision making (see chart for details).    MDM Rules/Calculators/A&P                         Patient here with flulike symptoms.  I ordered and reviewed labs which include urinalysis, CBC and CMP without significant abnormality, patient's respiratory panel is positive for COVID.  He has no active vomiting at this time.  Patient given a shot of Decadron. Marland Kitchen  He will be discharged with symptomatic treatment.  Discussed return precautions and supportive care as well as isolation precautions. Final Clinical Impression(s) / ED Diagnoses Final diagnoses:  COVID-19 virus infection    Rx / DC Orders ED Discharge Orders    None       Arthor Captain, PA-C 04/23/21 1535    Horton, Clabe Seal, DO 04/24/21 [phone]

## 2021-04-25 ENCOUNTER — Telehealth: Payer: Self-pay

## 2021-04-25 NOTE — Telephone Encounter (Signed)
Transition Care Management Unsuccessful Follow-up Telephone Call  Date of discharge and from where:  04/23/21 from [facility]  Attempts:  1st Attempt  Reason for unsuccessful TCM follow-up call:  Missing or invalid number

## 2021-04-26 ENCOUNTER — Other Ambulatory Visit: Payer: Self-pay | Admitting: Internal Medicine

## 2021-04-26 DIAGNOSIS — N529 Male erectile dysfunction, unspecified: Secondary | ICD-10-CM

## 2021-04-26 NOTE — Telephone Encounter (Signed)
Transition Care Management Unsuccessful Follow-up Telephone Call  Date of discharge and from where:  04/23/21 from [facility]  Attempts:  2nd Attempt  Reason for unsuccessful TCM follow-up call:  Missing or invalid number

## 2021-04-27 NOTE — Telephone Encounter (Signed)
Transition Care Management Unsuccessful Follow-up Telephone Call  Date of discharge and from where:  04/23/21 from [facility]  Attempts:  3rd Attempt  Reason for unsuccessful TCM follow-up call:  Unable to reach patient

## 2021-05-02 ENCOUNTER — Encounter: Payer: Self-pay | Admitting: *Deleted

## 2021-05-02 NOTE — Congregational Nurse Program (Signed)
  Dept: [phone]   Congregational Nurse Program Note  Date of Encounter: 05/02/2021  Past Medical History: Past Medical History:  Diagnosis Date  . 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 Diabetes mellitus   . High cholesterol   . Hypertension   . Substance abuse (HCC)    cocaine. Stopped in 2010  . Tobacco use disorder 10/07/2014    Encounter Details:  CNP Questionnaire - 05/02/21 1137      Questionnaire   Do you give verbal consent to treat you today? Yes    Visit Setting Church or Organization    Location Patient Served At [facility]    Patient Status Homeless    Medical Provider Yes    Insurance Medicaid    Intervention Support    Housing/Utilities No permanent housing          Client came into writer's office to have vitals taken and cbg check. CBG 204. Client reports he drank two cups of orange juice this morning. Discussed better beverage choices with client. Client has not taken his medication today and reports he plans to take his medicine.Referred to primary MD.  Nira Conn RN CN [phone]

## 2021-05-23 ENCOUNTER — Encounter: Payer: Self-pay | Admitting: *Deleted

## 2021-05-23 NOTE — Congregational Nurse Program (Signed)
  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 Stopped in 2010   Tobacco use disorder 10/07/2014    Encounter Details:  CNP Questionnaire - 05/23/21 1459       Questionnaire   Do you give verbal consent to treat you today? Yes    Visit Setting Church or Organization    Location Patient Served At [facility]    Patient Status Homeless    Medical Provider Yes    Insurance Medicaid    Intervention Assess (including screenings);Educate    Housing/Utilities No permanent housing           Client registered for an upcoming fitness class and had his blood pressure checked. It was elevated at 167/103. Client has not taken his medication today. Encouraged client to take his medication and follow up again. Educated client on heart disease and hypertension. Asencion Loveday W RN CN [phone]

## 2021-05-25 ENCOUNTER — Other Ambulatory Visit: Payer: Self-pay

## 2021-05-25 ENCOUNTER — Ambulatory Visit (INDEPENDENT_AMBULATORY_CARE_PROVIDER_SITE_OTHER): Payer: Medicaid Other | Admitting: Podiatry

## 2021-05-25 ENCOUNTER — Encounter: Payer: Self-pay | Admitting: *Deleted

## 2021-05-25 ENCOUNTER — Encounter: Payer: Self-pay | Admitting: Podiatry

## 2021-05-25 DIAGNOSIS — M79676 Pain in unspecified toe(s): Secondary | ICD-10-CM | POA: Diagnosis not present

## 2021-05-25 DIAGNOSIS — E1142 Type 2 diabetes mellitus with diabetic polyneuropathy: Secondary | ICD-10-CM | POA: Diagnosis not present

## 2021-05-25 DIAGNOSIS — B351 Tinea unguium: Secondary | ICD-10-CM | POA: Diagnosis not present

## 2021-05-25 NOTE — Congregational Nurse Program (Signed)
  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 Stopped in 2010   Tobacco use disorder 10/07/2014    Encounter Details:  CNP Questionnaire - 05/25/21 0900       Questionnaire   Do you give verbal consent to treat you today? Yes    Visit Setting Church or Organization    Location Patient Served At [facility]    Patient Status Homeless    Medical Provider Yes    Insurance Medicaid    Intervention Support    Housing/Utilities No permanent housing    Transportation Provided transportation assistance ([facility] transp,bus pass, taxi voucher, etc.)            Client requested help with transportation to a doctor's appt this morning. Gave two bus passes. Deniro Laymon W RN CN

## 2021-05-26 NOTE — Progress Notes (Signed)
  Subjective:  Patient ID: Brady Church, male    DOB: 03-May-1961,  MRN: [id number]  60 y.o. male presents with at risk foot care with history of diabetic neuropathy and painful thick toenails that are difficult to trim. Pain interferes with ambulation. Aggravating factors include wearing enclosed shoe gear. Pain is relieved with periodic professional debridement..    Patient does not monitor blood glucose on a daily basis.  PCP: [name], MD and last visit was: 04/13/2021.  Review of Systems: Negative except as noted in the HPI.   No Known Allergies  Objective:  There were no vitals filed for this visit. Constitutional Patient is a pleasant 60 y.o. African American male in NAD. AAO x 3.  Vascular Capillary refill time to digits immediate b/l. Palpable pedal pulses b/l LE. Pedal hair sparse. Lower extremity skin temperature gradient within normal limits. No pain with calf compression b/l. No edema noted b/l lower extremities. No cyanosis or clubbing noted.  Neurologic Normal speech. Protective sensation diminished with 10g monofilament b/l.  Dermatologic Pedal skin with normal turgor, texture and tone b/l lower extremities No open wounds b/l lower extremities No interdigital macerations b/l lower extremities Toenails 1-5 b/l elongated, discolored, dystrophic, thickened, crumbly with subungual debris and tenderness to dorsal palpation.  Orthopedic: Normal muscle strength 5/5 to all lower extremity muscle groups bilaterally. No pain crepitus or joint limitation noted with ROM b/l. Hallux valgus with bunion deformity noted b/l lower extremities.   Hemoglobin A1C Latest Ref Rng & Units 04/13/2021 10/05/2020  HGBA1C 4.0 - 5.6 % 6.6(A) 6.6(A)  Some recent data might be hidden   Assessment:   1. Pain due to onychomycosis of toenail   2. Diabetic peripheral neuropathy associated with type 2 diabetes mellitus (HCC)    Plan:  Patient was evaluated and treated and all questions  answered.  Onychomycosis with pain -Nails palliatively debridement as below. -Educated on self-care  Procedure: Nail Debridement Rationale: Pain Type of Debridement: manual, sharp debridement. Instrumentation: Nail nipper, rotary burr. Number of Nails: 10  -Examined patient. -Continue diabetic foot care principles. -Patient to continue soft, supportive shoe gear daily. -Toenails 1-5 b/l were debrided in length and girth with sterile nail nippers and dremel without iatrogenic bleeding.  -Patient to report any pedal injuries to medical professional immediately. -Patient/POA to call should there be question/concern in the interim.  Return in about 3 months (around 08/25/2021).  [name], DPM

## 2021-05-27 DIAGNOSIS — Z1152 Encounter for screening for COVID-19: Secondary | ICD-10-CM | POA: Diagnosis not present

## 2021-06-01 ENCOUNTER — Encounter: Payer: Self-pay | Admitting: *Deleted

## 2021-06-01 NOTE — Congregational Nurse Program (Signed)
  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 Stopped in 2010   Tobacco use disorder 10/07/2014    Encounter Details:  CNP Questionnaire - 06/01/21 1503       Questionnaire   Do you give verbal consent to treat you today? Yes    Visit Setting Church or Organization    Location Patient Served At [facility]    Patient Status Homeless    Medical Provider Yes    Insurance Medicaid    Intervention Support            Client attended [facility] fitness class.

## 2021-06-06 ENCOUNTER — Encounter: Payer: Self-pay | Admitting: *Deleted

## 2021-06-06 NOTE — Congregational Nurse Program (Signed)
  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 Stopped in 2010   Tobacco use disorder 10/07/2014    Encounter Details: Encouraged client to take his medications. Not taken any today.

## 2021-06-15 ENCOUNTER — Encounter: Payer: Self-pay | Admitting: *Deleted

## 2021-06-15 NOTE — Congregational Nurse Program (Signed)
  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 Stopped in 2010   Tobacco use disorder 10/07/2014    Encounter Details:  CNP Questionnaire - 06/15/21 1309       Questionnaire   Do you give verbal consent to treat you today? Yes    Visit Setting Church or Organization    Location Patient Served At [facility]    Patient Status Homeless    Medical Provider Yes    Insurance Medicaid    Intervention Assess (including screenings)    Housing/Utilities No permanent housing             Client came back to nurse's office for a blood pressure recheck after he took his medication.

## 2021-06-15 NOTE — Congregational Nurse Program (Signed)
  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 Stopped in 2010   Tobacco use disorder 10/07/2014    Encounter Details:  CNP Questionnaire - 06/15/21 1258       Questionnaire   Do you give verbal consent to treat you today? Yes    Visit Setting Church or Organization    Location Patient Served At [facility]    Patient Status Homeless    Medical Provider Yes    Insurance Medicaid    Intervention Assess (including screenings);Support    Housing/Utilities No permanent housing    Transportation Provided transportation assistance ([facility],bus pass, taxi voucher, etc.)           Client came in [facility] requesting a CBG check and blood pressure check. CBG 117. Blood pressure elevated. Encouraged client to take his medications as he has not taken today and comeback for recheck. Clint Biello W RN CN [phone]

## 2021-06-24 ENCOUNTER — Other Ambulatory Visit: Payer: Self-pay | Admitting: Internal Medicine

## 2021-06-24 ENCOUNTER — Telehealth: Payer: Self-pay | Admitting: *Deleted

## 2021-06-24 ENCOUNTER — Other Ambulatory Visit: Payer: Self-pay

## 2021-06-24 ENCOUNTER — Telehealth: Payer: Self-pay

## 2021-06-24 ENCOUNTER — Encounter: Payer: Self-pay | Admitting: *Deleted

## 2021-06-24 DIAGNOSIS — N529 Male erectile dysfunction, unspecified: Secondary | ICD-10-CM

## 2021-06-24 DIAGNOSIS — I10 Essential (primary) hypertension: Secondary | ICD-10-CM

## 2021-06-24 NOTE — Congregational Nurse Program (Signed)
  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 Stopped in 2010   Tobacco use disorder 10/07/2014    Encounter Details:  CNP Questionnaire - 06/24/21 1301       Questionnaire   Do you give verbal consent to treat you today? Yes    Visit Setting Church or Organization    Location Patient Served At [facility] Provider Yes    Insurance Medicaid    Intervention Assess (including screenings);Support;Refer    Housing/Utilities No permanent housing    Transportation Provided transportation assistance ([facility],bus pass, taxi voucher, etc.)    Referrals Other   [facility]           Client came in this morning to have his vitals and blood sugar checked. Vitals elevated and CBG 166. Client reports he has not had his blood pressure medication or eye drops in a few days. He ran out of medications and said he does not have money for his medicine until he gets his check at the beginning of next month. [facility], he does not have any refills available. Called Dr [facility]. Client has an appt on Sept 13th at 10:00. Dr will call in eye drop refills to pharmacy. Called [facility] and spoke with [facility]. She will ask MD about refills for BP medication and call back. Called [facility] again. They now have prescriptions for medications. Asked them to waive fee as client does not have any money. They can not waive fee because he owes money for previous prescriptions. Asked to have one time transfer to [facility] and pay through CN grant. Was told [facility] would need to contact them. [facility] and they will fill prescriptions. Will pick up later today. Brady Church W RN  CN [phone]

## 2021-06-24 NOTE — Telephone Encounter (Signed)
Called and requested refills of client's medication. Per Stacie at Internal Medicine, she will check with doctor and call back.

## 2021-06-24 NOTE — Telephone Encounter (Signed)
sildenafil (REVATIO) 20 MG tablet, REFILL REQUEST @ [address] [facility] - [place], [place] - [address]

## 2021-06-24 NOTE — Telephone Encounter (Signed)
[facility] a second time to see if they had prescriptions available. The do have the prescriptions at this time. They can not waive the fee due to client owing from previous prescriptions. Will have [facility] fill under CN grant. Per [facility] pharmacist, must have [facility] contact them for one time transfer.

## 2021-06-24 NOTE — Telephone Encounter (Signed)
Called [facility] to verify when client's upcoming appt is. Client has an appt Sept 13th at 10:00. Inquired about eye drops as client reports he is out of his medication. Per call, doctor will refill his three eye drop prescriptions and call into [facility] pharmacy.

## 2021-06-24 NOTE — Telephone Encounter (Signed)
Received TC from Erskine Squibb, a Engineer, water, who states patient is out of Lisinopril. Per chart review, Lisinopril was sent via [facility] on 04/13/21 with 5 refills, however, RX was not in "normal" mode and there is no receipt confirmation.  TC to [facility], spoke with pharmacist Dedra Skeens, who states they did not receive the RX.  RN gave VO for the Lisinopril.  TC back to Congrgational nurse and she was notified.  Will forward to PCP  SChaplin, RN,BSN

## 2021-06-24 NOTE — Telephone Encounter (Signed)
[facility] and asked if client had any medications he could pick up as he is out of his blood pressure medication and eye drops. Per first call to pharmacy he does not have any refills available.

## 2021-06-24 NOTE — Telephone Encounter (Signed)
[facility] will fill prescriptions with CN grant. They are contacting [facility]. Will pick up later this afternoon.

## 2021-06-27 ENCOUNTER — Emergency Department (HOSPITAL_COMMUNITY): Payer: Medicaid Other

## 2021-06-27 ENCOUNTER — Other Ambulatory Visit: Payer: Self-pay

## 2021-06-27 ENCOUNTER — Emergency Department (HOSPITAL_COMMUNITY)
Admission: EM | Admit: 2021-06-27 | Discharge: 2021-06-27 | Disposition: A | Discharge status: Home / Self Care | Payer: Medicaid Other | Attending: Emergency Medicine | Admitting: Emergency Medicine

## 2021-06-27 DIAGNOSIS — M79672 Pain in left foot: Secondary | ICD-10-CM | POA: Diagnosis not present

## 2021-06-27 DIAGNOSIS — Z87891 Personal history of nicotine dependence: Secondary | ICD-10-CM | POA: Insufficient documentation

## 2021-06-27 DIAGNOSIS — I110 Hypertensive heart disease with heart failure: Secondary | ICD-10-CM | POA: Diagnosis not present

## 2021-06-27 DIAGNOSIS — Z0389 Encounter for observation for other suspected diseases and conditions ruled out: Secondary | ICD-10-CM | POA: Diagnosis not present

## 2021-06-27 DIAGNOSIS — M7989 Other specified soft tissue disorders: Secondary | ICD-10-CM | POA: Diagnosis not present

## 2021-06-27 DIAGNOSIS — M7732 Calcaneal spur, left foot: Secondary | ICD-10-CM | POA: Diagnosis not present

## 2021-06-27 DIAGNOSIS — M79671 Pain in right foot: Secondary | ICD-10-CM | POA: Diagnosis not present

## 2021-06-27 DIAGNOSIS — I5042 Chronic combined systolic (congestive) and diastolic (congestive) heart failure: Secondary | ICD-10-CM | POA: Insufficient documentation

## 2021-06-27 DIAGNOSIS — Z79899 Other long term (current) drug therapy: Secondary | ICD-10-CM | POA: Insufficient documentation

## 2021-06-27 DIAGNOSIS — S99922A Unspecified injury of left foot, initial encounter: Secondary | ICD-10-CM | POA: Diagnosis not present

## 2021-06-27 DIAGNOSIS — E119 Type 2 diabetes mellitus without complications: Secondary | ICD-10-CM | POA: Insufficient documentation

## 2021-06-27 DIAGNOSIS — Z7982 Long term (current) use of aspirin: Secondary | ICD-10-CM | POA: Insufficient documentation

## 2021-06-27 DIAGNOSIS — I10 Essential (primary) hypertension: Secondary | ICD-10-CM | POA: Diagnosis not present

## 2021-06-27 DIAGNOSIS — M898X7 Other specified disorders of bone, ankle and foot: Secondary | ICD-10-CM | POA: Diagnosis not present

## 2021-06-27 MED ORDER — KETOROLAC TROMETHAMINE 60 MG/2ML IM SOLN
30.0000 mg | Freq: Once | INTRAMUSCULAR | Status: AC
  Administered 2021-06-27: 30 mg via INTRAMUSCULAR
  Filled 2021-06-27: qty 2

## 2021-06-27 MED ORDER — SILDENAFIL CITRATE 20 MG PO TABS: ORAL_TABLET | ORAL | 0 refills | Status: DC

## 2021-06-27 NOTE — Discharge Instructions (Addendum)
Your imaging was negative for acute fracture or dislocation.  Recommend NSAIDs for pain control, crutches as needed and weightbearing as tolerated to both feet.
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
## 2021-06-27 NOTE — ED Notes (Signed)
Patient transported to CT 

## 2021-06-27 NOTE — ED Notes (Signed)
Patient transported to X-ray 

## 2021-06-27 NOTE — ED Notes (Signed)
Pt ambulated in room with walking stick. Pt steady on feet. States his feet hurt.  Provided pt with lunch bag and drink.
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
## 2021-06-28 ENCOUNTER — Telehealth: Payer: Self-pay

## 2021-06-28 NOTE — Telephone Encounter (Signed)
Transition Care Management Unsuccessful Follow-up Telephone Call  Date of discharge and from where:  06/27/2021-[place]  Attempts:  1st Attempt  Reason for unsuccessful TCM follow-up call:  Left voice message    

## 2021-06-29 NOTE — Telephone Encounter (Signed)
Transition Care Management Unsuccessful Follow-up Telephone Call  Date of discharge and from where:  06/27/2021-[place]  Attempts:  2nd Attempt  Reason for unsuccessful TCM follow-up call:  Left voice message

## 2021-06-30 NOTE — Telephone Encounter (Signed)
Transition Care Management Unsuccessful Follow-up Telephone Call  Date of discharge and from where:  06/27/2021-[place]   Attempts:  3rd Attempt  Reason for unsuccessful TCM follow-up call:  Left voice message

## 2021-07-04 ENCOUNTER — Telehealth: Payer: Self-pay | Admitting: *Deleted

## 2021-07-04 ENCOUNTER — Encounter: Payer: Self-pay | Admitting: *Deleted

## 2021-07-04 NOTE — Congregational Nurse Program (Signed)
  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 Stopped in 2010   Tobacco use disorder 10/07/2014    Encounter Details:  CNP Questionnaire - 07/04/21 0954       Questionnaire   Do you give verbal consent to treat you today? Yes    Visit Setting Church or Organization    Location Patient Served At [facility]    Patient Status Homeless    Medical Provider Yes    Insurance Medicaid    Intervention Assess (including screenings);Educate;Support    Housing/Utilities No permanent housing            Client seen at [facility]. Checked vitals rt arm this morning 186/124 pulse 81. Checked lt arm 183/116 pulse 79. Instructed client he needed to take his daily blood pressure medication. Rechecked 30 minutes after client took his medication 163/95 pulse. Educated client on importance of taking his medication. Checked CBG 157. [employer]. Eye drops are ready for pick up. Writer picking up medication and bringing it back to [facility] for client.  Donika Butner W RN CN [phone]

## 2021-07-04 NOTE — Telephone Encounter (Signed)
[facility], clients Alphagan eye drops are ready for pick up.

## 2021-07-06 ENCOUNTER — Other Ambulatory Visit: Payer: Self-pay

## 2021-07-06 ENCOUNTER — Ambulatory Visit (INDEPENDENT_AMBULATORY_CARE_PROVIDER_SITE_OTHER): Payer: Medicaid Other | Admitting: Internal Medicine

## 2021-07-06 ENCOUNTER — Encounter: Payer: Self-pay | Admitting: Internal Medicine

## 2021-07-06 ENCOUNTER — Encounter: Payer: Self-pay | Admitting: *Deleted

## 2021-07-06 VITALS — BP 127/78 | HR 76 | Temp 98.6°F | Resp 28 | Ht 68.0 in | Wt 216.9 lb

## 2021-07-06 DIAGNOSIS — M79672 Pain in left foot: Secondary | ICD-10-CM

## 2021-07-06 DIAGNOSIS — E1136 Type 2 diabetes mellitus with diabetic cataract: Secondary | ICD-10-CM | POA: Diagnosis not present

## 2021-07-06 DIAGNOSIS — M79671 Pain in right foot: Secondary | ICD-10-CM

## 2021-07-06 LAB — GLUCOSE, CAPILLARY: Glucose-Capillary: 160 mg/dL — ABNORMAL HIGH (ref 70–99)

## 2021-07-06 MED ORDER — KETOROLAC TROMETHAMINE 30 MG/ML IJ SOLN
30.0000 mg | Freq: Once | INTRAMUSCULAR | Status: AC
  Administered 2021-07-06: 30 mg via INTRAMUSCULAR

## 2021-07-06 MED ORDER — NAPROXEN 500 MG PO TABS: 500.0000 mg | ORAL_TABLET | Freq: Two times a day (BID) | ORAL | 0 refills | Status: AC

## 2021-07-06 NOTE — Progress Notes (Signed)
   CC: bilateral foot pain  HPI:Mr.Keefer Soulliere is a 60 y.o. male who presents for evaluation of bilateral foot pain. Please see individual problem based A/P for details.  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 Stopped in 2010   Tobacco use disorder 10/07/2014   Review of Systems:   Review of Systems  Constitutional:  Negative for chills and fever.  Musculoskeletal:  Positive for myalgias. Negative for falls and joint pain.    Physical Exam: Vitals:   07/06/21 1016  BP: 127/78  Pulse: 76  Resp: (!) 28  Temp: 98.6 F (37 C)  TempSrc: Oral  SpO2: 98%  Weight: 216 lb 14.4 oz (98.4 kg)  Height: 5\' 8"  (1.727 m)   General: Middle-age man with walking stick, NAD Cardiovascular: Normal rate, regular rhythm.  No murmurs, rubs, or gallops Pulmonary : Equal breath sounds, No wheezes, rales, or rhonchi Abdominal: soft, nontender,  bowel sounds present Ext: Trace edema bilateral, 2+ DP & PT pulse bilateral   Assessment & Plan:   See Encounters Tab for problem based charting.  Patient discussed with Dr. 

## 2021-07-06 NOTE — Patient Instructions (Signed)
Thank you for trusting me with your care. To recap, today we discussed the following.  1. Foot pain, bilateral - ketorolac (TORADOL) 30 MG/ML injection 30 mg - naproxen (NAPROSYN) 500 MG tablet; Take 1 tablet (500 mg total) by mouth 2 (two) times daily with a meal for 10 days.  Dispense: 20 tablet; Refill: 0

## 2021-07-06 NOTE — Congregational Nurse Program (Signed)
  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 Stopped in 2010   Tobacco use disorder 10/07/2014    Encounter Details:  CNP Questionnaire - 07/06/21 0815       Questionnaire   Do you give verbal consent to treat you today? Yes    Visit Setting Church or Organization    Location Patient Served At [facility]    Patient Status Homeless    Medical Provider Yes    Insurance Medicaid    Intervention Educate;Support    Housing/Utilities No permanent housing    Transportation Provided transportation assistance ([facility] transp,bus pass, taxi voucher, etc.)    Referrals Medicaid   medicaid transportation           Client came in nurses office requesting a blood pressure check and cbg. CBG 224. Client reports he has not eaten but drank a beer this morning. Educated on better food choices and diabetes education. Client has an appt with internal medicine this morning. Gave two bus passes for transportation. Discussed using Medicaid transportation with three day advance notice for future appointments.  Patsy Zaragoza W RN CN [phone]

## 2021-07-08 ENCOUNTER — Encounter: Payer: Self-pay | Admitting: *Deleted

## 2021-07-08 ENCOUNTER — Encounter: Payer: Self-pay | Admitting: Internal Medicine

## 2021-07-08 DIAGNOSIS — Z1152 Encounter for screening for COVID-19: Secondary | ICD-10-CM | POA: Diagnosis not present

## 2021-07-08 DIAGNOSIS — M79671 Pain in right foot: Secondary | ICD-10-CM | POA: Insufficient documentation

## 2021-07-08 DIAGNOSIS — M79672 Pain in left foot: Secondary | ICD-10-CM | POA: Insufficient documentation

## 2021-07-08 NOTE — Congregational Nurse Program (Signed)
  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 Stopped in 2010   Tobacco use disorder 10/07/2014    Encounter Details:  CNP Questionnaire - 07/08/21 1243       Questionnaire   Do you give verbal consent to treat you today? Yes    Visit Setting Church or Organization    Location Patient Served At [facility]    Patient Status Homeless    Medical Provider Yes    Insurance Medicaid    Intervention Educate;Support    Housing/Utilities No permanent housing            Client came into office after eating lunch for vitals check and cbg check.Vitals 142/82 Pulse 74, CBG 171.
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
## 2021-07-12 ENCOUNTER — Telehealth: Payer: Self-pay | Admitting: *Deleted

## 2021-07-12 ENCOUNTER — Encounter: Payer: Self-pay | Admitting: *Deleted

## 2021-07-12 NOTE — Telephone Encounter (Signed)
Appt made for Aug 17th at 1:15 for [facility].

## 2021-07-12 NOTE — Congregational Nurse Program (Signed)
  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 Stopped in 2010   Tobacco use disorder 10/07/2014    Encounter Details:  CNP Questionnaire - 07/12/21 1345       Questionnaire   Do you give verbal consent to treat you today? Yes    Visit Setting Church or Organization    Location Patient Served At [facility]    Patient Status Homeless    Medical Provider Yes    Insurance Medicaid    Intervention Educate;Refer    Housing/Utilities No permanent housing    Transportation Provided transportation assistance ([facility] trans[facility],bus pass, taxi voucher, etc.)    Referrals PCP - other provider   [facility]            Client came into nurses office requesting help with appt to [facility]. He reports rt foot discomfort that increases with walking. He describes as callus on his toes. Made an appt for Aug 17th at 1:15. Set up [facility] to p/u at [facility] 12:50. Checked vitals and they are elevated. Client has not taken his blood pressure medication. Encouraged him to take his medication. Checked cgb. CBG 237. Client reports he had coffee and OJ prior to coming in office. Educated client on foods/juices that affect blood sugar.  Danyael Alipio W RN CN [phone]

## 2021-07-12 NOTE — Telephone Encounter (Signed)
Set up transportation with [facility] for Aug 17th appt with [facility]. Transportation will pick up at [facility] 12:50.

## 2021-07-12 NOTE — Progress Notes (Signed)
Internal Medicine Clinic Attending  Case discussed with Dr. Steen  At the time of the visit.  We reviewed the resident's history and exam and pertinent patient test results.  I agree with the assessment, diagnosis, and plan of care documented in the resident's note.  

## 2021-07-13 ENCOUNTER — Telehealth: Payer: Self-pay | Admitting: *Deleted

## 2021-07-13 ENCOUNTER — Ambulatory Visit (INDEPENDENT_AMBULATORY_CARE_PROVIDER_SITE_OTHER): Payer: Medicaid Other | Admitting: Podiatry

## 2021-07-13 ENCOUNTER — Other Ambulatory Visit: Payer: Self-pay

## 2021-07-13 ENCOUNTER — Encounter: Payer: Self-pay | Admitting: Podiatry

## 2021-07-13 ENCOUNTER — Encounter: Payer: Self-pay | Admitting: *Deleted

## 2021-07-13 DIAGNOSIS — L84 Corns and callosities: Secondary | ICD-10-CM

## 2021-07-13 DIAGNOSIS — M79676 Pain in unspecified toe(s): Secondary | ICD-10-CM

## 2021-07-13 DIAGNOSIS — B351 Tinea unguium: Secondary | ICD-10-CM | POA: Diagnosis not present

## 2021-07-13 DIAGNOSIS — E1142 Type 2 diabetes mellitus with diabetic polyneuropathy: Secondary | ICD-10-CM

## 2021-07-13 NOTE — Telephone Encounter (Signed)
Left message for return call with Margie Ege CM

## 2021-07-13 NOTE — Telephone Encounter (Signed)
CM Margie Ege called back and spoke with client.
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
## 2021-07-19 DIAGNOSIS — Z1152 Encounter for screening for COVID-19: Secondary | ICD-10-CM | POA: Diagnosis not present

## 2021-07-20 ENCOUNTER — Encounter: Payer: Self-pay | Admitting: *Deleted

## 2021-07-20 NOTE — Congregational Nurse Program (Signed)
  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 Stopped in 2010   Tobacco use disorder 10/07/2014    Encounter Details:  CNP Questionnaire - 07/20/21 1140       Questionnaire   Do you give verbal consent to treat you today? Yes    Visit Setting Church or Organization    Location Patient Served At [facility]    Patient Status Homeless    Medical Provider Yes    Insurance Medicaid    Intervention Support    Housing/Utilities No permanent housing    Transportation Provided transportation assistance ([facility] transp,bus pass, taxi voucher, etc.)             Client came to nurse's office to have a blood pressure check. He plans to go to [employer] in [place] to inquire about housing and requested a bus pass. Checked vitals and gave a bus pass. Safety maintained. Quinn Quam W RN CN [phone]

## 2021-07-25 ENCOUNTER — Encounter: Payer: Self-pay | Admitting: *Deleted
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
## 2021-08-03 ENCOUNTER — Encounter: Payer: Self-pay | Admitting: *Deleted

## 2021-08-03 NOTE — Congregational Nurse Program (Signed)
  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 Stopped in 2010   Tobacco use disorder 10/07/2014    Encounter Details:  CNP Questionnaire - 08/03/21 0920       Questionnaire   Do you give verbal consent to treat you today? Yes    Visit Setting Church or Organization    Location Patient Served At [facility]    Patient Status Homeless    Medical Provider Yes    Insurance Medicaid    Intervention Assess (including screenings);Support    Housing/Utilities No permanent housing             Client seen in nurses's office to have blood pressure and CBG check. BP elevated 158/90 and client was encouraged to take his daily medication. Client had eaten breakfast and not taken his medication yet. CBG 173.

## 2021-08-09 LAB — HM DIABETES EYE EXAM

## 2021-08-10 ENCOUNTER — Encounter: Payer: Self-pay | Admitting: *Deleted

## 2021-08-10 NOTE — Congregational Nurse Program (Signed)
  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 Stopped in 2010   Tobacco use disorder 10/07/2014    Encounter Details:  CNP Questionnaire - 08/10/21 0921       Questionnaire   Do you give verbal consent to treat you today? Yes    Visit Setting Church or Organization    Location Patient Served At [facility]    Patient Status Homeless    Medical Provider Yes    Insurance Medicaid    Intervention Assess (including screenings);Support    Housing/Utilities No permanent housing    Transportation Provided transportation assistance ([facility] trans[facility],bus pass, taxi voucher, etc.)    Referrals Other   Social Services           Client came to nurses office to have blood pressure and cbg check. Vitals WNL and cbg 139. Offered support and encouragement. Client reports he has been eating better and walking. He requested bus passes to [facility] office. Gave as requested. Yuki Brunsman W RN CN [phone]

## 2021-08-17 ENCOUNTER — Encounter: Payer: Self-pay | Admitting: *Deleted

## 2021-08-17 NOTE — Congregational Nurse Program (Signed)
  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 Stopped in 2010   Tobacco use disorder 10/07/2014    Encounter Details:  CNP Questionnaire - 08/17/21 1350       Questionnaire   Do you give verbal consent to treat you today? Yes    Visit Setting Church or Organization    Location Patient Served At [facility]    Patient Status Homeless    Medical Provider Yes    Insurance Medicaid    Intervention Support;Counsel    Housing/Utilities No permanent housing            Client came to nurse's office for blood pressure and blood sugar check. Vitals are elevated and client has not taken his daily blood pressure medication. Encouraged to take. Checked cbg. CBG 94. Offered support and encouragement. Client reports he has been drinking more water and better food choices.  Jaretzi Droz W RN CN [phone]

## 2021-08-22 ENCOUNTER — Encounter: Payer: Self-pay | Admitting: *Deleted

## 2021-08-22 NOTE — Congregational Nurse Program (Signed)
  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 Stopped in 2010   Tobacco use disorder 10/07/2014    Encounter Details:  CNP Questionnaire - 08/22/21 1329       Questionnaire   Do you give verbal consent to treat you today? Yes    Visit Setting Church or Organization    Location Patient Served At [facility]    Patient Status Homeless    Medical Provider Yes    Insurance Medicaid    Intervention Support;Assess (including screenings)    Housing/Utilities No permanent housing            Client came into [facility] to have his blood pressure and blood sugar checked. Vitals 182/103 pulse 74. CBG 198. Encouraged client to take his medications and come back to office for recheck. Rechecked blood pressure 153/83 pulse 88.  Greidys Deland W RN CN [phone]

## 2021-08-24 DIAGNOSIS — Z1152 Encounter for screening for COVID-19: Secondary | ICD-10-CM | POA: Diagnosis not present

## 2021-08-26 ENCOUNTER — Other Ambulatory Visit: Payer: Self-pay

## 2021-08-26 ENCOUNTER — Other Ambulatory Visit: Payer: Self-pay | Admitting: Internal Medicine

## 2021-08-26 DIAGNOSIS — I10 Essential (primary) hypertension: Secondary | ICD-10-CM

## 2021-08-29 ENCOUNTER — Other Ambulatory Visit: Payer: Self-pay

## 2021-08-29 DIAGNOSIS — I10 Essential (primary) hypertension: Secondary | ICD-10-CM

## 2021-08-29 MED ORDER — LISINOPRIL 40 MG PO TABS: 40.0000 mg | ORAL_TABLET | Freq: Every day | ORAL | 5 refills | Status: DC

## 2021-08-30 ENCOUNTER — Ambulatory Visit: Payer: Medicaid Other | Admitting: Podiatry

## 2021-08-30 DIAGNOSIS — Z1152 Encounter for screening for COVID-19: Secondary | ICD-10-CM | POA: Diagnosis not present

## 2021-08-31 ENCOUNTER — Encounter: Payer: Self-pay | Admitting: *Deleted

## 2021-08-31 NOTE — Congregational Nurse Program (Signed)
  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 Stopped in 2010   Tobacco use disorder 10/07/2014    Encounter Details:  CNP Questionnaire - 08/31/21 1312       Questionnaire   Do you give verbal consent to treat you today? Yes    Location Patient Served  [facility]    Visit Setting Church or Organization    Patient Status Homeless    Insurance [facility]    Insurance Referral [name]    Medical Provider Yes    Medical Referral [name]    Medical Appointment Made [name]    Housing/Utilities No permanent housing   working with a CM   Intervention Support;Blood pressure;Blood glucose    ED Visit Averted [name]    Life-Saving Intervention Made [name]    Patient Status Homeless            Client came to nurses office requesting blood pressure and cbg check. Vitals 137/86 pulse 88 and CBG 198. Offered support and encouraged client to take daily blood pressure and diabetes medication.  [name] W RN CN [phone]

## 2021-09-05 ENCOUNTER — Other Ambulatory Visit: Payer: Self-pay

## 2021-09-05 ENCOUNTER — Encounter: Payer: Self-pay | Admitting: *Deleted

## 2021-09-05 DIAGNOSIS — N529 Male erectile dysfunction, unspecified: Secondary | ICD-10-CM

## 2021-09-05 DIAGNOSIS — H409 Unspecified glaucoma: Secondary | ICD-10-CM

## 2021-09-05 MED ORDER — SILDENAFIL CITRATE 20 MG PO TABS: ORAL_TABLET | ORAL | 0 refills | Status: DC

## 2021-09-05 NOTE — Congregational Nurse Program (Signed)
  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 Stopped in 2010   Tobacco use disorder 10/07/2014    Encounter Details:  CNP Questionnaire - 09/05/21 1030       Questionnaire   Do you give verbal consent to treat you today? Yes    Location Patient Served  [facility]    Visit Setting Church or Organization    Patient Status Homeless    Insurance [facility]    Insurance Referral [name]    Medication [name]    Medical Provider Yes    Screening Referrals [name]    Medical Referral [name]    Medical Appointment Made [name]    Food [name]    Transportation Provided transportation assistance    Housing/Utilities No permanent housing    Intervention Blood pressure;Blood glucose;Support    ED Visit Averted [name]    Life-Saving Intervention Made [name]    Patient Status Homeless            Client came to nurse's office for blood pressure and cbg check. Blood pressure is elevated and cbg 218. Encouraged client to take his medication and he ate breakfast prior to being accessed. Client reports he has blood pressure medication to be picked up at pharmacy. Gave two bus passes to get medication. No other concerns at this time.  Tyress Loden W RN CN [phone]

## 2021-09-05 NOTE — Telephone Encounter (Signed)
Patient needs to follow up with ophthalmology for his eye drops. Refilled sildenafil.

## 2021-09-14 ENCOUNTER — Encounter: Payer: Self-pay | Admitting: *Deleted

## 2021-09-14 ENCOUNTER — Telehealth: Payer: Self-pay | Admitting: *Deleted
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
## 2021-09-14 NOTE — Telephone Encounter (Signed)
Called Dr Burr Medico DDS and made an appt for December 15th at 0900.

## 2021-09-16 ENCOUNTER — Other Ambulatory Visit: Payer: Self-pay | Admitting: Internal Medicine

## 2021-09-16 DIAGNOSIS — N529 Male erectile dysfunction, unspecified: Secondary | ICD-10-CM

## 2021-09-20 ENCOUNTER — Other Ambulatory Visit: Payer: Self-pay

## 2021-09-20 DIAGNOSIS — H409 Unspecified glaucoma: Secondary | ICD-10-CM

## 2021-09-20 NOTE — Telephone Encounter (Signed)
sildenafil (REVATIO) 20 MG tablet  dorzolamide-timolol (COSOPT) 22.3-6.8 MG/ML ophthalmic solution  fluorometholone (FML) 0.1 % ophthalmic suspension  latanoprost (XALATAN) 0.005 % ophthalmic solution, REFILL REQUEST @ [address] [facility] - [place], [place] - [address]

## 2021-09-20 NOTE — Telephone Encounter (Signed)
Patient needs to request these refills from his ophthalmologist.

## 2021-09-20 NOTE — Telephone Encounter (Signed)
Next appt scheduled 10/12/21 with PCP. 

## 2021-09-20 NOTE — Telephone Encounter (Signed)
Pt was called - no answer; left message to call the office. 

## 2021-09-21 NOTE — Telephone Encounter (Signed)
Called pt - no answer. Left message - pt needs to call his eye doctor.

## 2021-10-05 ENCOUNTER — Encounter: Payer: Self-pay | Admitting: *Deleted

## 2021-10-05 DIAGNOSIS — Z139 Encounter for screening, unspecified: Secondary | ICD-10-CM

## 2021-10-05 LAB — GLUCOSE, POCT (MANUAL RESULT ENTRY): POC Glucose: 193 mg/dl — AB (ref 70–99)

## 2021-10-05 NOTE — Congregational Nurse Program (Signed)
  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 Stopped in 2010   Tobacco use disorder 10/07/2014    Encounter Details:  CNP Questionnaire - 10/05/21 1400       Questionnaire   Do you give verbal consent to treat you today? Yes    Location Patient Served  [facility]    Visit Setting Church or [address]Phone/Text/Email    Patient Status Homeless    Insurance [employer] Referral [name]    Medication [name]    Medical Provider Yes    Screening Referrals [name]    Medical Referral [name]    Medical Appointment Made [name]    Food [name]    Transportation Provided transportation assistance    Housing/Utilities [name]    Interpersonal Safety [name]    Intervention Blood pressure;Blood glucose;Support;Educate    ED Visit Averted [name]    Life-Saving Intervention Made [name]    Visit Setting Church or Organization    Patient Status Homeless            Client came to nurse's office to have his blood pressure and a cbg. Completed both as requested. Client asked about an upcoming appt with [facility]. Called to verify appt time. It is Nov 16th at 9:15 and 10:15. Contacted [facility] [employer] and was on hold as client needed to leave. Will try again to assist with transportation.  Gaige Fussner W RN CN

## 2021-10-07 ENCOUNTER — Encounter: Payer: Self-pay | Admitting: *Deleted

## 2021-10-07 NOTE — Congregational Nurse Program (Signed)
  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 Stopped in 2010   Tobacco use disorder 10/07/2014    Encounter Details:  CNP Questionnaire - 10/07/21 1408       Questionnaire   Do you give verbal consent to treat you today? Yes    Location Patient Served  [facility]    Visit Setting Phone/Text/Email    Patient Status Homeless    Insurance [facility]    Insurance Referral [name]    Medication [name]    Medical Provider Yes    Screening Referrals [name]    Medical Referral [name]    Medical Appointment Made [name]    Food [name]    Transportation Provided transportation assistance    Housing/Utilities [name]    Interpersonal Safety [name]    Intervention Advocate    ED Visit Averted [name]    Life-Saving Intervention Made [name]    Patient Status Homeless    Visit Setting Phone/Text/Email            Scheduled transportation for Nov 16th to [facility]. Client will be picked up at [employer] around 8:35 for a 9:15 and 10:15 appt.

## 2021-10-10 ENCOUNTER — Emergency Department (HOSPITAL_COMMUNITY)
Admission: EM | Admit: 2021-10-10 | Discharge: 2021-10-10 | Disposition: A | Discharge status: Home / Self Care | Payer: Medicaid Other | Attending: Emergency Medicine | Admitting: Emergency Medicine

## 2021-10-10 ENCOUNTER — Telehealth: Payer: Self-pay | Admitting: Internal Medicine

## 2021-10-10 ENCOUNTER — Emergency Department (HOSPITAL_COMMUNITY): Payer: Medicaid Other

## 2021-10-10 ENCOUNTER — Other Ambulatory Visit: Payer: Self-pay

## 2021-10-10 ENCOUNTER — Encounter: Payer: Self-pay | Admitting: *Deleted

## 2021-10-10 ENCOUNTER — Encounter (HOSPITAL_COMMUNITY): Payer: Self-pay | Admitting: Emergency Medicine

## 2021-10-10 DIAGNOSIS — Y900 Blood alcohol level of less than 20 mg/100 ml: Secondary | ICD-10-CM | POA: Diagnosis not present

## 2021-10-10 DIAGNOSIS — Z139 Encounter for screening, unspecified: Secondary | ICD-10-CM

## 2021-10-10 DIAGNOSIS — R569 Unspecified convulsions: Secondary | ICD-10-CM | POA: Diagnosis not present

## 2021-10-10 DIAGNOSIS — Z7982 Long term (current) use of aspirin: Secondary | ICD-10-CM | POA: Insufficient documentation

## 2021-10-10 DIAGNOSIS — Z79899 Other long term (current) drug therapy: Secondary | ICD-10-CM | POA: Insufficient documentation

## 2021-10-10 DIAGNOSIS — E119 Type 2 diabetes mellitus without complications: Secondary | ICD-10-CM | POA: Insufficient documentation

## 2021-10-10 DIAGNOSIS — R0902 Hypoxemia: Secondary | ICD-10-CM | POA: Diagnosis not present

## 2021-10-10 DIAGNOSIS — Z87891 Personal history of nicotine dependence: Secondary | ICD-10-CM | POA: Insufficient documentation

## 2021-10-10 DIAGNOSIS — I10 Essential (primary) hypertension: Secondary | ICD-10-CM | POA: Insufficient documentation

## 2021-10-10 DIAGNOSIS — R55 Syncope and collapse: Secondary | ICD-10-CM

## 2021-10-10 DIAGNOSIS — R404 Transient alteration of awareness: Secondary | ICD-10-CM | POA: Diagnosis not present

## 2021-10-10 DIAGNOSIS — R4020 Unspecified coma: Secondary | ICD-10-CM | POA: Diagnosis not present

## 2021-10-10 LAB — CBC WITH DIFFERENTIAL/PLATELET
Abs Immature Granulocytes: 0.01 10*3/uL (ref 0.00–0.07)
Basophils Absolute: 0 10*3/uL (ref 0.0–0.1)
Basophils Relative: 1 %
Eosinophils Absolute: 0.2 10*3/uL (ref 0.0–0.5)
Eosinophils Relative: 6 %
HCT: 40 % (ref 39.0–52.0)
Hemoglobin: 14.2 g/dL (ref 13.0–17.0)
Immature Granulocytes: 0 %
Lymphocytes Relative: 35 %
Lymphs Abs: 1.5 10*3/uL (ref 0.7–4.0)
MCH: 32.4 pg (ref 26.0–34.0)
MCHC: 35.5 g/dL (ref 30.0–36.0)
MCV: 91.3 fL (ref 80.0–100.0)
Monocytes Absolute: 0.4 10*3/uL (ref 0.1–1.0)
Monocytes Relative: 9 %
Neutro Abs: 2.1 10*3/uL (ref 1.7–7.7)
Neutrophils Relative %: 49 %
Platelets: 238 10*3/uL (ref 150–400)
RBC: 4.38 MIL/uL (ref 4.22–5.81)
RDW: 14.1 % (ref 11.5–15.5)
WBC: 4.3 10*3/uL (ref 4.0–10.5)
nRBC: 0 % (ref 0.0–0.2)

## 2021-10-10 LAB — RAPID URINE DRUG SCREEN, HOSP PERFORMED
Amphetamines: NOT DETECTED
Barbiturates: NOT DETECTED
Benzodiazepines: NOT DETECTED
Cocaine: NOT DETECTED
Opiates: NOT DETECTED
Tetrahydrocannabinol: NOT DETECTED

## 2021-10-10 LAB — COMPREHENSIVE METABOLIC PANEL
ALT: 20 U/L (ref 0–44)
AST: 28 U/L (ref 15–41)
Albumin: 3.7 g/dL (ref 3.5–5.0)
Alkaline Phosphatase: 52 U/L (ref 38–126)
Anion gap: 13 (ref 5–15)
BUN: 15 mg/dL (ref 6–20)
CO2: 24 mmol/L (ref 22–32)
Calcium: 8.9 mg/dL (ref 8.9–10.3)
Chloride: 98 mmol/L (ref 98–111)
Creatinine, Ser: 1.59 mg/dL — ABNORMAL HIGH (ref 0.61–1.24)
GFR, Estimated: 50 mL/min — ABNORMAL LOW (ref >60)
Glucose, Bld: 151 mg/dL — ABNORMAL HIGH (ref 70–99)
Potassium: 3.3 mmol/L — ABNORMAL LOW (ref 3.5–5.1)
Sodium: 135 mmol/L (ref 135–145)
Total Bilirubin: 0.9 mg/dL (ref 0.3–1.2)
Total Protein: 6.8 g/dL (ref 6.5–8.1)

## 2021-10-10 LAB — CBG MONITORING, ED: Glucose-Capillary: 165 mg/dL — ABNORMAL HIGH (ref 70–99)

## 2021-10-10 LAB — ETHANOL: Alcohol, Ethyl (B): 12 mg/dL — ABNORMAL HIGH (ref <10)

## 2021-10-10 LAB — LACTIC ACID, PLASMA
Lactic Acid, Venous: 3 mmol/L (ref 0.5–1.9)
Lactic Acid, Venous: 2.2 mmol/L (ref 0.5–1.9)

## 2021-10-10 LAB — TROPONIN I (HIGH SENSITIVITY)
Troponin I (High Sensitivity): 20 ng/L — ABNORMAL HIGH (ref <18)
Troponin I (High Sensitivity): 13 ng/L (ref <18)

## 2021-10-10 LAB — GLUCOSE, POCT (MANUAL RESULT ENTRY): POC Glucose: 170 mg/dl — AB (ref 70–99)

## 2021-10-10 MED ORDER — SODIUM CHLORIDE 0.9 % IV BOLUS
1000.0000 mL | Freq: Once | INTRAVENOUS | Status: AC
  Administered 2021-10-10: 1000 mL via INTRAVENOUS

## 2021-10-10 NOTE — ED Provider Notes (Signed)
Emergency Medicine Provider Triage Evaluation Note  Brady Church , a 60 y.o. male  was evaluated in triage.  Pt complains of syncope.  Patient seen on arrival.  Patient placed into ED exam room by EMS.  Patient was apparently at the [facility] "hanging out."  Patient had witnessed syncopal event where he became unresponsive for about 3 minutes.  He did not have a fall or injury related to his syncope.  Patient with another secondary syncopal event witnessed by EMS during transport.  This second event lasted approximately 45 seconds.  Patient does no alcohol.  He admits to drinking beer and wine earlier today.  Currently he is otherwise without complaint.  He denies feeling bad prior to the reported syncope.  He denies current symptoms.  Review of Systems  Positive: Syncope Negative: Denies chest pain, shortness of breath, nausea, vomiting, weakness  Physical Exam  There were no vitals taken for this visit. Gen:   Awake, no distress   Resp:  Normal effort  MSK:   Moves extremities without difficulty    Medical Decision Making  Medically screening exam initiated at 2:38 PM.  Appropriate orders placed.  Brady Church was informed that the remainder of the evaluation will be completed by another provider, this initial triage assessment does not replace that evaluation, and the importance of remaining in the ED until their evaluation is complete.  Patient is presenting by EMS after witnessed syncopal event.  Patient admits to recent alcohol use.  Patient was apparently at the [facility] when he had his witnessed syncope.  Patient without associated chest pain, shortness of breath, or current neurologic complaint.  Screening labs initiated.  Patient is aware that this is the initiation of ED care.   Wynetta Fines, MD 10/10/21 [phone]

## 2021-10-10 NOTE — ED Notes (Signed)
SW called to assist with Gilmer Mor.

## 2021-10-10 NOTE — ED Triage Notes (Signed)
Pt BIB [facility] from [facility]. Pt was sitting down at a table inside and slumped down, eyes rolled back and pt went unresponsive for about 3 minutes. Pt was lowered to the ground, did not fall. Pt c/o generalized weakness and was a/ox4 after event. Pt was on ambulance and had a witnessed syncopal episode, EMS reports pt had nystagmus in both eyes and twitching on the L side of his face, sx lasting about 45 seconds. BP 80 systolic after this event. Pt reports he drank 1 can of beer and some wine today. A/ox4.

## 2021-10-10 NOTE — Telephone Encounter (Signed)
Refill Request-   sildenafil (REVATIO) 20 MG tablet  [facility] - [place], [place] - [address] (Ph: [phone])

## 2021-10-10 NOTE — ED Notes (Signed)
Patient provided with food and drink with Provider approval. Patient denies any further needs at this time. Stretcher in low and locked position. Side rails up x2. Call bell in reach. Patient verbalizes understanding.   

## 2021-10-10 NOTE — Congregational Nurse Program (Signed)
  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 Stopped in 2010   Tobacco use disorder 10/07/2014    Encounter Details:  CNP Questionnaire - 10/10/21 0810       Questionnaire   Do you give verbal consent to treat you today? Yes    Location Patient Served  [facility]    Visit Setting Church or Organization    Patient Status Homeless    Insurance [facility]    Insurance Referral [name]    Medication [name]    Medical Provider Yes    Screening Referrals [name]    Medical Referral [name]    Medical Appointment Made [name]    Food [name]    Transportation [name]    Housing/Utilities [name]   Working with CM   Interpersonal Safety [name]    Intervention Blood pressure;Support    ED Visit Averted [name]    Life-Saving Intervention Made [name]    Patient Status Homeless    Visit Setting Church or Organization            Client came to nurses's office this morning to have his blood pressure and CBG checked. BP (!) 156/110 (BP Location: Left Arm, Patient Position: Sitting, Cuff Size: Normal)   Pulse 82 , CBG 170. Client is asymptomatic. Instructed client to take his morning blood pressure medication and asked him to return for a recheck. Client reports one of his upcoming doctor appointments was canceled by provider. He said he would be back later in the day for a recheck and get transportation changed for his doctor's appt. Ameira Alessandrini W RN CN [phone]

## 2021-10-10 NOTE — ED Provider Notes (Signed)
60 yo  male presenting with transient syncope at [facility] today, reports etoh use this morning.  Asymptomatic in the ED.  Patient is pending labs, CT scan of the brain  Personally viewed and interpreted his EKG.  This shows a sinus rhythm with nonspecific T wave inversions in the inferior lateral leads, 2 also noted on prior EKGs.  No significant changes from his prior EKGs.  CT scan does not show any clear evidence of acute stroke  Orthostatic VS unremarkable.   9 pm -my reassessment the patient has been asymptomatic for several hours.  He ate an entire meal here.  He ambulated steadily to the bathroom.  No hypoxia.  A single episode of hypoxia with 84% was likely an erroneous reading automated in the room. His blood pressure has been stable.  He denies chest pain or headache.  His work-up was largely unremarkable, doubt acute coronary syndrome or pulmonary embolism or acute anemia.  There is mildly elevated lactate which may been related to dehydration, but this is improved with a liter of fluids.  His UDS is negative.  We believe he is medically stable for discharge.      Terald Sleeper, MD 10/10/21 2112

## 2021-10-10 NOTE — ED Notes (Signed)
SW provided patient with cane

## 2021-10-10 NOTE — Telephone Encounter (Signed)
Sildenafil was sent via [facility] on 10/24 w/ 2 refills.  Receipt confirmation. SChaplin, RN,BSN

## 2021-10-11 ENCOUNTER — Telehealth: Payer: Self-pay

## 2021-10-11 NOTE — Telephone Encounter (Signed)
Transition Care Management Unsuccessful Follow-up Telephone Call  Date of discharge and from where:  10/10/2021-[place]  Attempts:  1st Attempt  Reason for unsuccessful TCM follow-up call:  Left voice message

## 2021-10-12 ENCOUNTER — Encounter: Payer: Self-pay | Admitting: Internal Medicine

## 2021-10-12 ENCOUNTER — Other Ambulatory Visit: Payer: Self-pay

## 2021-10-12 ENCOUNTER — Ambulatory Visit (INDEPENDENT_AMBULATORY_CARE_PROVIDER_SITE_OTHER): Payer: Medicaid Other | Admitting: Internal Medicine

## 2021-10-12 ENCOUNTER — Encounter: Payer: Self-pay | Admitting: *Deleted

## 2021-10-12 ENCOUNTER — Encounter: Payer: Medicaid Other | Admitting: Dietician

## 2021-10-12 VITALS — BP 132/81 | HR 81 | Temp 98.2°F | Ht 68.0 in | Wt 216.3 lb

## 2021-10-12 DIAGNOSIS — I10 Essential (primary) hypertension: Secondary | ICD-10-CM | POA: Diagnosis not present

## 2021-10-12 DIAGNOSIS — E1136 Type 2 diabetes mellitus with diabetic cataract: Secondary | ICD-10-CM | POA: Diagnosis not present

## 2021-10-12 DIAGNOSIS — R55 Syncope and collapse: Secondary | ICD-10-CM

## 2021-10-12 DIAGNOSIS — F1010 Alcohol abuse, uncomplicated: Secondary | ICD-10-CM

## 2021-10-12 DIAGNOSIS — G6289 Other specified polyneuropathies: Secondary | ICD-10-CM | POA: Diagnosis not present

## 2021-10-12 DIAGNOSIS — E785 Hyperlipidemia, unspecified: Secondary | ICD-10-CM | POA: Diagnosis not present

## 2021-10-12 DIAGNOSIS — E538 Deficiency of other specified B group vitamins: Secondary | ICD-10-CM | POA: Diagnosis not present

## 2021-10-12 DIAGNOSIS — I63512 Cerebral infarction due to unspecified occlusion or stenosis of left middle cerebral artery: Secondary | ICD-10-CM | POA: Diagnosis not present

## 2021-10-12 LAB — GLUCOSE, CAPILLARY: Glucose-Capillary: 112 mg/dL — ABNORMAL HIGH (ref 70–99)

## 2021-10-12 LAB — POCT GLYCOSYLATED HEMOGLOBIN (HGB A1C): Hemoglobin A1C: 6.4 % — AB (ref 4.0–5.6)

## 2021-10-12 MED ORDER — ATORVASTATIN CALCIUM 80 MG PO TABS: ORAL_TABLET | ORAL | 5 refills | Status: DC

## 2021-10-12 MED ORDER — GABAPENTIN 300 MG PO CAPS
300.0000 mg | ORAL_CAPSULE | Freq: Three times a day (TID) | ORAL | 5 refills | Status: DC
Start: 2021-10-12 — End: 2022-01-11

## 2021-10-12 MED ORDER — FOLIC ACID 1 MG PO TABS: 1.0000 mg | ORAL_TABLET | Freq: Every day | ORAL | 5 refills | Status: DC

## 2021-10-12 MED ORDER — VITAMIN B-1 100 MG PO TABS
100.0000 mg | ORAL_TABLET | Freq: Every day | ORAL | 5 refills | Status: DC
Start: 2021-10-12 — End: 2022-01-11

## 2021-10-12 MED ORDER — ASPIRIN 81 MG PO TBEC: 81.0000 mg | DELAYED_RELEASE_TABLET | Freq: Every day | ORAL | 5 refills | Status: DC

## 2021-10-12 MED ORDER — MULTIVITAMIN-MINERALS PO TABS: 1.0000 | ORAL_TABLET | Freq: Every day | ORAL | 5 refills | Status: DC

## 2021-10-12 NOTE — Telephone Encounter (Signed)
Transition Care Management Unsuccessful Follow-up Telephone Call  Date of discharge and from where:  10/10/2021 from [facility]  Attempts:  2nd Attempt  Reason for unsuccessful TCM follow-up call:  Unable to reach patient

## 2021-10-12 NOTE — Progress Notes (Signed)
Subjective:   Patient ID: Brady Church male   DOB: 05-10-1961 60 y.o.   MRN: [id number]  HPI: Mr.Brady Church is a 60 y.o. here for follow-up of chronic conditions.  Mr. Reaume was seen in the ED on 11/14 for syncope evaluation. Evaluation was largely unremarkable. Ethanol mildly elevated, UDS negative. He did have mildly elevated lactate which improved with fluids. Cr elevated as well. EKG was SR w/ TWI not significantly changed from prior. CTH without acute findings.   Please see problem-based charting for further details.  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 Stopped in 2010   Tobacco use disorder 10/07/2014   Current Outpatient Medications  Medication Sig Dispense Refill   ALPHAGAN P 0.1 % SOLN INSTILL 1 DROP INTO EACH EYE TWICE DAILY 5 mL 5   aspirin 81 MG EC tablet Take 1 tablet (81 mg total) by mouth daily. 30 tablet 5   atorvastatin (LIPITOR) 80 MG tablet TAKE 1 TABLET BY MOUTH ONCE DAILY AT  6  PM 30 tablet 5   dorzolamide-timolol (COSOPT) 22.3-6.8 MG/ML ophthalmic solution Place 1 drop into the right eye 2 (two) times daily. 10 mL 5   fluorometholone (FML) 0.1 % ophthalmic suspension INSTILL 1 DROP INTO EACH EYE ONCE DAILY 5 mL 5   fluticasone (FLONASE) 50 MCG/ACT nasal spray Place 1 spray into both nostrils daily. (Patient not taking: Reported on 10/10/2021) 16 g 1   folic acid (FOLVITE) 1 MG tablet Take 1 tablet (1 mg total) by mouth daily. 30 tablet 5   gabapentin (NEURONTIN) 300 MG capsule Take 1 capsule (300 mg total) by mouth 3 (three) times daily. 90 capsule 5   lisinopril (ZESTRIL) 40 MG tablet Take 1 tablet (40 mg total) by mouth daily. 30 tablet 5   Multiple Vitamins-Minerals (MULTIVITAMIN-MINERALS) TABS Take 1 tablet by mouth daily. 30 tablet 5   sildenafil (REVATIO) 20 MG tablet TAKE 2 TO 5 TABLETS  BY MOUTH AS NEEDED 30  MINUTES  PRIOR  TO  SEXUAL  ACTIVITY 20 tablet 2   thiamine (VITAMIN B-1) 100 MG tablet Take 1 tablet (100 mg total) by mouth daily. 30 tablet 5   No current facility-administered medications for this visit.   Family History  Problem Relation Age of Onset   Coronary artery disease Mother 67       died of MI   Cancer Father 50       Some GI cancer. Not sure if it was Colon Cancer or not.   Hypertension Father    Diabetes Maternal Grandmother    Social History   Socioeconomic History   Marital status: Single    Spouse name: Not on file   Number of children: Not on file   Years of education: 12   Highest education level: Not on file  Occupational History   Not on file  Tobacco Use   Smoking status: Former    Packs/day: 0.50    Types: Cigarettes    Quit date: 02/18/2017    Years since quitting: 4.6   Smokeless tobacco: Never   Tobacco comments:    Quit over 1 year ago.  Substance and Sexual Activity   Alcohol use: Yes    Alcohol/week: 0.0 standard drinks    Comment: Beer- 32 oz. every  day.   Drug use: Not Currently    Types: Cocaine, Marijuana    Comment: Sometimes.   Sexual activity: Not on file  Other Topics Concern   Not on file  Social History Narrative   Lives in [place] for last 10 years with "friend"  Brady Church      Is not married and does not have kids.   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   Social Determinants of Health   Financial Resource Strain: Not on file  Food Insecurity: Not on file  Transportation Needs: Not on file  Physical Activity: Not on file  Stress: Not on file  Social Connections: Not on file   Objective:  Physical Exam: Vitals:   10/12/21 1041  BP: 132/81  Pulse: 81  Temp: 98.2 F (36.8 C)  TempSrc: Oral  SpO2: 99%  Weight: 216 lb 4.8 oz (98.1 kg)  Height: 5\' 8"  (1.727 m)   General appearance: alert, cooperative, and no  distress Lungs: clear to auscultation bilaterally Heart: regular rate and rhythm, S1, S2 normal, no murmur, click, rub or gallop Extremities: extremities normal, atraumatic, no cyanosis or edema Assessment & Plan:   See Encounters tab for problem-based charting.  Return in about 3 months (around 01/12/2022) for f/u Dr. 01/14/2022.  Antony Contras, MD

## 2021-10-12 NOTE — Assessment & Plan Note (Signed)
Continues to be diet controlled. A1c 6.4% today. Follows with ophthalmology, planning to see them again in January. Podiatry appointment later this week for foot care. Resumed atorvastatin today as patient had stopped.

## 2021-10-12 NOTE — Assessment & Plan Note (Signed)
Recently presented to the ED with an episode of syncope. Patient reports he felt dizzy prior to this episode and he hadn't been drinking any water for quite some time as well as alcohol use earlier that day. Denied any palpitations, chest pain, nausea, or other symptoms preceding. Lower concern for cardiac etiology. Evaluation in the ED most consistent with dehydration given elevated LA and Cr. He denies any further episodes since discharge and has been increasing his water intake. Food and fluid regularity may be difficult for this gentleman due to resources. Checking BMP.

## 2021-10-12 NOTE — Assessment & Plan Note (Signed)
He has been off his medications. Restarting atorvastatin. Check lipid panel at f/u visit.

## 2021-10-12 NOTE — Assessment & Plan Note (Signed)
Off supplementation, last B12 normal 6 months ago. Will recheck today.

## 2021-10-12 NOTE — Assessment & Plan Note (Signed)
Controlled today. Patient reports not taking any of his medications other than lisinopril. Will continue on lisinopril monotherapy. Checking BMP.

## 2021-10-12 NOTE — Patient Instructions (Addendum)
Thank you for coming in today!   Blood Pressure: Please continue your lisinopril.   Diabetes: Your hemoglobin A1c is 6.4. Keep up the great work with your diet. Please restart your gabapentin for neuropathy pain.   Cholesterol: Please restart your atorvastatin  History of stroke: Please restart your aspirin.   Alcohol Use: Continue to work on cutting back. Please restart your thiamine, folate, and multivitamin supplements.    Vitamin B12 Deficiency: I am rechecking your levels. I will call you if we need to restart supplements.   Continue to drink your water and stay hydrated! If you notice dizziness, lightheadedness, or another episode of passing out, please make an appointment to see [place] in clinic as soon as possible.

## 2021-10-12 NOTE — Congregational Nurse Program (Signed)
  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 Stopped in 2010   Tobacco use disorder 10/07/2014    Encounter Details:  CNP Questionnaire - 10/12/21 0908       Questionnaire   Do you give verbal consent to treat you today? Yes    Location Patient Served  [facility]    Visit Setting Church or Organization    Patient Status Homeless    Insurance [facility]    Insurance Referral [name]    Medication [name]    Medical Provider Yes    Screening Referrals [name]    Medical Referral [name]    Medical Appointment Made [name]    Food [name]    Transportation Provided transportation assistance    Housing/Utilities No permanent housing   Working with CM   Interpersonal Safety [name]    Intervention Support    ED Visit Averted [name]    Life-Saving Intervention Made [name]    Patient Status Homeless    Visit Setting [employer] or Organization               Dept: [phone]   Congregational Nurse Program Note  Date of Encounter: 10/12/2021  Past Medical History: Past Medical History:  Diagnosis Date   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 Stopped in 2010   Tobacco use disorder 10/07/2014    Encounter Details:  CNP Questionnaire - 10/12/21 0908       Questionnaire   Do you give verbal consent to treat you today? Yes    Location Patient Served  [facility]    Visit Setting Church or Organization    Patient Status Homeless    Insurance [facility]    Insurance Referral [name]    Medication [name]    Medical Provider Yes    Screening Referrals [name]    Medical Referral [name]    Medical Appointment Made [name]    Food [name]    Transportation  Provided transportation assistance    Housing/Utilities No permanent housing   Working with [employer]   Interpersonal Safety [name]    Intervention Support    ED Visit Averted [name]    Life-Saving Intervention Made [name]    Patient Status Homeless    Visit Setting Church or Organization            Client came to office reporting he has a doctor's appt with Internal Medicine at 10:15. He has another appt with podiatry on Friday. Client reports he would rather have a bus pass due to not waiting for rides. Gave four bus passes to assist with appt today and Friday. Ashland Osmer W RN CN

## 2021-10-12 NOTE — Assessment & Plan Note (Signed)
Endorses continued alcohol use. Restart multivitamin, folate, and thiamine.

## 2021-10-12 NOTE — Assessment & Plan Note (Signed)
Chronic RUE paresthesias from stroke as well as DM neuropathy. Resuming gabapentin 300 mg TID.

## 2021-10-12 NOTE — Assessment & Plan Note (Signed)
Patient has been off aspirin and atorvastatin for at least 6 months per his report. Discussed the indications for these medications today and their importance, patient expressed understanding. Refilled prescriptions.

## 2021-10-13 ENCOUNTER — Telehealth: Payer: Self-pay | Admitting: Internal Medicine

## 2021-10-13 LAB — BMP8+ANION GAP
Anion Gap: 18 mmol/L (ref 10.0–18.0)
BUN/Creatinine Ratio: 18 (ref 9–20)
BUN: 16 mg/dL (ref 6–24)
CO2: 21 mmol/L (ref 20–29)
Calcium: 9.3 mg/dL (ref 8.7–10.2)
Chloride: 101 mmol/L (ref 96–106)
Creatinine, Ser: 0.87 mg/dL (ref 0.76–1.27)
Glucose: 93 mg/dL (ref 70–99)
Potassium: 4.2 mmol/L (ref 3.5–5.2)
Sodium: 140 mmol/L (ref 134–144)
eGFR: 100 mL/min/{1.73_m2} (ref >59)

## 2021-10-13 LAB — LIPID PANEL
Chol/HDL Ratio: 3.5 ratio (ref 0.0–5.0)
Cholesterol, Total: 213 mg/dL — ABNORMAL HIGH (ref 100–199)
HDL: 61 mg/dL (ref >39)
LDL Chol Calc (NIH): 128 mg/dL — ABNORMAL HIGH (ref 0–99)
Triglycerides: 135 mg/dL (ref 0–149)
VLDL Cholesterol Cal: 24 mg/dL (ref 5–40)

## 2021-10-13 LAB — VITAMIN B12: Vitamin B-12: 350 pg/mL (ref 232–1245)

## 2021-10-13 NOTE — Telephone Encounter (Signed)
Transition Care Management Unsuccessful Follow-up Telephone Call  Date of discharge and from where:  10/10/2021 from [facility]  Attempts:  3rd Attempt  Reason for unsuccessful TCM follow-up call:  Unable to reach patient

## 2021-10-13 NOTE — Telephone Encounter (Signed)
Unable to reach Brady Church by telephone to review lab results. BMP unremarkable. Lipid panel with elevated LDL, although not taking statin. We discussed resuming at his recent visit. B12 wnl.

## 2021-10-14 ENCOUNTER — Encounter: Payer: Self-pay | Admitting: Podiatry

## 2021-10-14 ENCOUNTER — Ambulatory Visit (INDEPENDENT_AMBULATORY_CARE_PROVIDER_SITE_OTHER): Payer: Medicaid Other | Admitting: Podiatry

## 2021-10-14 ENCOUNTER — Other Ambulatory Visit: Payer: Self-pay

## 2021-10-14 DIAGNOSIS — B351 Tinea unguium: Secondary | ICD-10-CM

## 2021-10-14 DIAGNOSIS — M79676 Pain in unspecified toe(s): Secondary | ICD-10-CM | POA: Diagnosis not present

## 2021-10-14 DIAGNOSIS — L89890 Pressure ulcer of other site, unstageable: Secondary | ICD-10-CM

## 2021-10-14 DIAGNOSIS — E1142 Type 2 diabetes mellitus with diabetic polyneuropathy: Secondary | ICD-10-CM | POA: Diagnosis not present

## 2021-10-18 NOTE — Progress Notes (Signed)
  Subjective:  Patient ID: Brady Church, male    DOB: 10/10/1961,  MRN: [id number]  Brady Church presents to clinic today for at risk foot care with history of diabetic neuropathy and corn(s) right 5th digit , callus(es) bilateral great toes and painful mycotic nails.  Pain interferes with ambulation. Aggravating factors include wearing enclosed shoe gear. Painful toenails interfere with ambulation. Aggravating factors include wearing enclosed shoe gear. Pain is relieved with periodic professional debridement. Painful corns and calluses are aggravated when weightbearing with and without shoegear. Pain is relieved with periodic professional debridement.  Patient did not check blood glucose today.  PCP is Brady Poll, MD , and last visit was 04/13/2021.  Patient refuses paring of corns/calluses today. Medicaid ABN on file.  No Known Allergies  Review of Systems: Negative except as noted in the HPI. Objective:   Constitutional Brady Church is a pleasant 60 y.o. African American male, in NAD. AAO x 3.   Vascular CFT immediate b/l LE. Palpable DP/PT pulses b/l LE. Digital hair sparse b/l. Skin temperature gradient WNL b/l. No pain with calf compression b/l. No edema noted b/l. No cyanosis or clubbing noted b/l LE. No cyanosis or clubbing noted.  Neurologic Normal speech. Oriented to person, place, and time. Protective sensation diminished with 10g monofilament b/l.  Dermatologic Pedal integument with normal turgor, texture and tone b/l LE. No open wounds b/l. No interdigital macerations b/l. Toenails 1-5 b/l elongated, thickened, discolored with subungual debris. +Tenderness with dorsal palpation of nailplates. Hyperkeratotic lesion(s) noted L hallux and R 5th toe. Pressure spots noted medial aspect of 2nd digit b/l. No erythema, no edema, no drainage, no fluctuance.  Orthopedic: Normal muscle strength 5/5 to all lower extremity muscle groups bilaterally. 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  Patient ambulates independently without assistive aids.   Radiographs: None Assessment:   1. Pain due to onychomycosis of toenail   2. Pressure injury of toe of left foot, unstageable (HCC)   3. Pressure injury of toe of right foot, unstageable (HCC)   4. Diabetic peripheral neuropathy associated with type 2 diabetes mellitus (HCC)    Plan:  Patient was evaluated and treated and all questions answered. Consent given for treatment as described below: -Medicaid ABN on file for paring of lesions. Patient refuses services today.. -Continue foot and shoe inspections daily. Monitor blood glucose per PCP/Endocrinologist's recommendations. -Mycotic toenails 1-5 bilaterally were debrided in length and girth with sterile nail nippers and dremel without incident. -Discussed pressure of both 2nd digits and need for pressure relief to avoid skin breakdown. Dispensed toe separators. Apply to 1st webspace b/l every morning. Remove every evening. Patient related understanding. -Patient/POA to call should there be question/concern in the interim.  Return in about 3 months (around 01/14/2022).  Brady Church, DPM

## 2021-10-19 ENCOUNTER — Encounter: Payer: Self-pay | Admitting: *Deleted

## 2021-10-19 DIAGNOSIS — Z139 Encounter for screening, unspecified: Secondary | ICD-10-CM

## 2021-10-19 LAB — GLUCOSE, POCT (MANUAL RESULT ENTRY): POC Glucose: 180 mg/dl — AB (ref 70–99)

## 2021-10-19 NOTE — Congregational Nurse Program (Signed)
  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 Stopped in 2010   Tobacco use disorder 10/07/2014    Encounter Details:  CNP Questionnaire - 10/19/21 1049       Questionnaire   Do you give verbal consent to treat you today? Yes    Location Patient Served  [facility]    Visit Setting Church or Organization    Patient Status Homeless    Insurance [facility]    Insurance Referral [name]    Medication [name]    Medical Provider Yes    Screening Referrals [name]    Medical Referral [name]    Medical Appointment Made [name]    Food [name]    Transportation Provided transportation assistance    Housing/Utilities No permanent housing   Working with [employer]   Interpersonal Safety [name]    Intervention Support    ED Visit Averted [name]    Life-Saving Intervention Made [name]    Patient Status Homeless    Visit Setting Church or Organization            Client came into office to have a blood pressure and CBG check. >BP 113/71 (BP Location: Left Arm, Patient Position: Sitting, Cuff Size: Normal)   Pulse 96 . CBG 180. Offered support and encouragement. No other requests or concerns. Makenzie Vittorio W RN CN

## 2021-10-24 ENCOUNTER — Encounter: Payer: Self-pay | Admitting: *Deleted

## 2021-10-24 DIAGNOSIS — Z139 Encounter for screening, unspecified: Secondary | ICD-10-CM

## 2021-10-24 LAB — GLUCOSE, POCT (MANUAL RESULT ENTRY): POC Glucose: 168 mg/dl — AB (ref 70–99)

## 2021-10-24 NOTE — Congregational Nurse Program (Signed)
  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 Stopped in 2010   Tobacco use disorder 10/07/2014    Encounter Details:  CNP Questionnaire - 10/24/21 1357       Questionnaire   Do you give verbal consent to treat you today? Yes    Location Patient Served  [facility]    Visit Setting Church or Organization    Patient Status Homeless    Insurance [facility]    Insurance Referral [name]    Medication [name]    Medical Provider Yes    Screening Referrals [name]    Medical Referral [name]    Medical Appointment Made [name]    Food [name]    Transportation Provided transportation assistance;[name]    Housing/Utilities No permanent housing   Working with [employer]   Interpersonal Safety [name]    Intervention Support;Blood glucose;Blood pressure    ED Visit Averted [name]    Life-Saving Intervention Made [name]    Patient Status Homeless    Visit Setting Church or Organization            Client came into nurse's office for vitals and cbg check. Blood pressure 139/87, pulse 83.  CBG 168. No other issues or concerns today.  Selso Mannor W RN CN

## 2021-10-25 DIAGNOSIS — Z1152 Encounter for screening for COVID-19: Secondary | ICD-10-CM | POA: Diagnosis not present

## 2021-11-07 ENCOUNTER — Encounter: Payer: Medicaid Other | Admitting: Dietician

## 2021-11-07 ENCOUNTER — Encounter: Payer: Self-pay | Admitting: *Deleted

## 2021-11-07 ENCOUNTER — Encounter: Payer: Self-pay | Admitting: Dietician

## 2021-11-07 DIAGNOSIS — Z139 Encounter for screening, unspecified: Secondary | ICD-10-CM

## 2021-11-07 LAB — GLUCOSE, POCT (MANUAL RESULT ENTRY): POC Glucose: 157 mg/dl — AB (ref 70–99)

## 2021-11-07 NOTE — Congregational Nurse Program (Signed)
  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 Stopped in 2010   Tobacco use disorder 10/07/2014    Encounter Details:  CNP Questionnaire - 11/07/21 1007       Questionnaire   Do you give verbal consent to treat you today? Yes    Location Patient Served  [facility]    Visit Setting Church or Organization    Patient Status Homeless    Insurance [facility]    Insurance Referral [name]    Medication [name]    Medical Provider Yes    Screening Referrals [name]    Medical Referral [name]    Medical Appointment Made [name]    Food [name]    Transportation Provided transportation assistance;[name]    Housing/Utilities No permanent housing   Working with CM   Interpersonal Safety [name]    Intervention Support;Blood glucose    ED Visit Averted [name]    Life-Saving Intervention Made [name]    Patient Status Homeless    Visit Setting Church or Organization            Client came to nurse's office to have his blood sugar checked and get help with transportation to upcoming dentist visit on 11/10/21 at 0900 with Dr Burr Medico. CBG 157 and two bus passes were given. Client is currently being provided a motel room for housing by [facility].  Pamela Intrieri W RN CN

## 2021-11-12 NOTE — ED Provider Notes (Signed)
[facility] EMERGENCY DEPARTMENT Provider Note   CSN: [id number] Arrival date & time: 10/10/21  1423     History Chief Complaint  Patient presents with   Loss of Consciousness    Brady Church is a 60 y.o. male.  Brady Church , a 60 y.o. male  was evaluated in triage.  Pt complains of syncope.  Patient seen on arrival.  Patient placed into ED exam room by EMS.  Patient was apparently at the [facility] "hanging out."  Patient had witnessed syncopal event where he became unresponsive for about 3 minutes.  He did not have a fall or injury related to his syncope.  Patient with another secondary syncopal event witnessed by EMS during transport.  This second event lasted approximately 45 seconds.   Patient does no alcohol.  He admits to drinking beer and wine earlier today.  Currently he is otherwise without complaint.   He denies feeling bad prior to the reported syncope.  He denies current symptoms.   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 Stopped in 2010   Tobacco use disorder 10/07/2014    Patient Active Problem List   Diagnosis Date Noted   Syncope 10/12/2021   Bilateral foot pain 07/08/2021   Shingles 10/05/2020   Vitamin B12 deficiency 08/06/2019   Erectile dysfunction 05/06/2018   Post-nasal drip 10/01/2017   Peripheral neuropathy 08/06/2017   Adjustment disorder with mixed anxiety and depressed mood    CVA (cerebral vascular accident) (HCC) 02/19/2017   Intracranial vascular stenosis 02/16/2017   History of substance abuse (HCC) 02/16/2017   Prolonged QT interval 02/16/2017   Chronic combined systolic and diastolic congestive heart failure (HCC) 05/12/2015   Stable angina (HCC) 01/07/2015   Alcohol abuse 11/06/2014   History of tobacco use 10/07/2014   Healthcare  maintenance 01/23/2014   Glaucoma 05/11/2012   Hyperlipidemia LDL goal <100 03/22/2012   Hypertension 01/17/2012   Diabetes mellitus with diabetic cataract (HCC) 03/08/2009    Past Surgical History:  Procedure Laterality Date   CATARACT EXTRACTION  01/08/2012   Right eye   EYE SURGERY     x 4 total        Family History  Problem Relation Age of Onset   Coronary artery disease Mother 22       died of MI   Cancer Father 54       Some GI cancer. Not sure if it was Colon Cancer or not.   Hypertension Father    Diabetes Maternal Grandmother     Social History   Tobacco Use   Smoking status: Former    Packs/day: 0.50    Types: Cigarettes    Quit date: 02/18/2017    Years since quitting: 4.7   Smokeless tobacco: Never   Tobacco comments:    Quit over 1 year ago.  Substance Use Topics   Alcohol use: Yes    Alcohol/week: 0.0 standard drinks    Comment: Beer- 32 oz. every day.   Drug use: Not Currently    Types: Cocaine, Marijuana    Comment: Sometimes.    Home Medications Prior to Admission medications   Medication Sig Start Date End Date Taking? Authorizing Provider  ALPHAGAN P 0.1 % SOLN INSTILL  1 DROP INTO EACH EYE TWICE DAILY 10/05/20  Yes Remo Lipps, MD  dorzolamide-timolol (COSOPT) 22.3-6.8 MG/ML ophthalmic solution Place 1 drop into the right eye 2 (two) times daily. 10/05/20  Yes Remo Lipps, MD  fluorometholone (FML) 0.1 % ophthalmic suspension INSTILL 1 DROP INTO [facility] EYE ONCE DAILY 10/05/20  Yes Remo Lipps, MD  lisinopril (ZESTRIL) 40 MG tablet Take 1 tablet (40 mg total) by mouth daily. 08/29/21  Yes [name], MD  sildenafil (REVATIO) 20 MG tablet TAKE 2 TO 5 TABLETS BY MOUTH AS NEEDED 30  MINUTES  PRIOR  TO  SEXUAL  ACTIVITY 09/19/21  Yes [name], MD  aspirin 81 MG EC tablet Take 1 tablet (81 mg total) by mouth daily. 10/12/21   Dickie La, MD  atorvastatin (LIPITOR) 80 MG tablet TAKE 1 TABLET BY MOUTH ONCE DAILY AT  6  PM 10/12/21    Dickie La, MD  fluticasone [facility]) 50 MCG/ACT nasal spray Place 1 spray into both nostrils daily. Patient not taking: Reported on 10/10/2021 04/13/21   [name], MD  folic acid (FOLVITE) 1 MG tablet Take 1 tablet (1 mg total) by mouth daily. 10/12/21   Dickie La, MD  gabapentin (NEURONTIN) 300 MG capsule Take 1 capsule (300 mg total) by mouth 3 (three) times daily. 10/12/21   Dickie La, MD  Multiple Vitamins-Minerals (MULTIVITAMIN-MINERALS) TABS Take 1 tablet by mouth daily. 10/12/21   Dickie La, MD  thiamine (VITAMIN B-1) 100 MG tablet Take 1 tablet (100 mg total) by mouth daily. 10/12/21   Dickie La, MD    Allergies    Patient has no known allergies.  Review of Systems   Review of Systems  Cardiovascular:  Positive for syncope.  Neurological:  Positive for syncope.  All other systems reviewed and are negative.  Physical Exam Updated Vital Signs BP 134/85 (BP Location: Left Arm)    Pulse 67    Temp 98.5 F (36.9 C) (Oral)    Resp 12    Ht 5\' 8"  (1.727 m)    Wt 96.6 kg    SpO2 100%    BMI 32.39 kg/m   Physical Exam Vitals and nursing note reviewed.  Constitutional:      General: He is not in acute distress.    Appearance: Normal appearance. He is well-developed.  HENT:     Head: Normocephalic and atraumatic.  Eyes:     Conjunctiva/sclera: Conjunctivae normal.     Pupils: Pupils are equal, round, and reactive to light.  Cardiovascular:     Rate and Rhythm: Normal rate and regular rhythm.     Heart sounds: Normal heart sounds.  Pulmonary:     Effort: Pulmonary effort is normal. No respiratory distress.     Breath sounds: Normal breath sounds.  Abdominal:     General: There is no distension.     Palpations: Abdomen is soft.     Tenderness: There is no abdominal tenderness.  Musculoskeletal:        General: No deformity. Normal range of motion.     Cervical back: Normal range of motion and neck supple.  Skin:    General: Skin is warm and dry.  Neurological:      General: No focal deficit present.     Mental Status: He is alert and oriented to person, place, and time.    ED Results / Procedures / Treatments   Labs (all labs ordered are listed, but only abnormal results are displayed) Labs Reviewed  ETHANOL - Abnormal; Notable for the following components:      Result Value   Alcohol, Ethyl (B) 12 (*)    All other components within normal limits  COMPREHENSIVE METABOLIC PANEL - Abnormal; Notable for the following components:   Potassium 3.3 (*)    Glucose, Bld 151 (*)    Creatinine, Ser 1.59 (*)    GFR, Estimated 50 (*)    All other components within normal limits  LACTIC ACID, PLASMA - Abnormal; Notable for the following components:   Lactic Acid, Venous 3.0 (*)    All other components within normal limits  LACTIC ACID, PLASMA - Abnormal; Notable for the following components:   Lactic Acid, Venous 2.2 (*)    All other components within normal limits  CBG MONITORING, ED - Abnormal; Notable for the following components:   Glucose-Capillary 165 (*)    All other components within normal limits  TROPONIN I (HIGH SENSITIVITY) - Abnormal; Notable for the following components:   Troponin I (High Sensitivity) 20 (*)    All other components within normal limits  RAPID URINE DRUG SCREEN, HOSP PERFORMED  CBC WITH DIFFERENTIAL/PLATELET  TROPONIN I (HIGH SENSITIVITY)    EKG EKG Interpretation  Date/Time:  Monday October 10 2021 14:36:01 EST Ventricular Rate:  75 PR Interval:  168 QRS Duration: 102 QT Interval:  445 QTC Calculation: 498 R Axis:   20 Text Interpretation: Sinus rhythm Abnormal T, consider ischemia, diffuse leads Confirmed by Kristine Royal [phone]) on 10/10/2021 2:45:32 PM  Radiology No results found.  Procedures Procedures   Medications Ordered in ED Medications  sodium chloride 0.9 % bolus 1,000 mL (0 mLs Intravenous Stopped 10/10/21 1621)    ED Course  I have reviewed the triage vital signs and the nursing  notes.  Pertinent labs & imaging results that were available during my care of the patient were reviewed by me and considered in my medical decision making (see chart for details).    MDM Rules/Calculators/A&P                         MDM  Patient seen upon arrival to the ED.  Screening labs ordered.  Pending disposition and reevaluation signed out to oncoming ED provider.     Final Clinical Impression(s) / ED Diagnoses Final diagnoses:  Near syncope    Rx / DC Orders ED Discharge Orders     None        Wynetta Fines, MD 11/12/21 1456

## 2021-11-14 ENCOUNTER — Encounter: Payer: Self-pay | Admitting: *Deleted

## 2021-11-14 DIAGNOSIS — Z139 Encounter for screening, unspecified: Secondary | ICD-10-CM

## 2021-11-14 LAB — GLUCOSE, POCT (MANUAL RESULT ENTRY): POC Glucose: 161 mg/dl — AB (ref 70–99)

## 2021-11-14 NOTE — Congregational Nurse Program (Signed)
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 Stopped in 2010   Tobacco use disorder 10/07/2014    Encounter Details:  CNP Questionnaire - 11/14/21 1000       Questionnaire   Do you give verbal consent to treat you today? Yes    Location Patient Served  [facility]    Visit Setting Church or Organization    Patient Status Homeless    Insurance [facility]    Insurance Referral [name]    Medication [name]    Medical Provider Yes    Screening Referrals [name]    Medical Referral [name]    Medical Appointment Made [name]    Food [name]    Transportation [name]    Housing/Utilities No permanent housing   Working with [employer]   Interpersonal Safety [name]    Intervention Support;Blood glucose;Blood pressure    ED Visit Averted [name]    Life-Saving Intervention Made [name]    Patient Status Homeless    Visit Setting Church or Organization            Client seen at [facility] for blood pressure and blood sugar check. Blood pressure elevated and cbg 161. Client reports he took his blood pressure medication this morning and ate grits with salt as well as sausage. Discussed food choices with less sodium. Client has no signs or symptoms of distress or complaints. Requested client return today to have his blood pressure rechecked.  Sadiq Mccauley W RN CN

## 2021-11-15 DIAGNOSIS — Z1152 Encounter for screening for COVID-19: Secondary | ICD-10-CM | POA: Diagnosis not present

## 2021-11-16 ENCOUNTER — Encounter: Payer: Medicaid Other | Admitting: Dietician

## 2021-12-14 ENCOUNTER — Encounter: Payer: Self-pay | Admitting: *Deleted

## 2021-12-14 DIAGNOSIS — Z139 Encounter for screening, unspecified: Secondary | ICD-10-CM

## 2021-12-14 LAB — GLUCOSE, POCT (MANUAL RESULT ENTRY): POC Glucose: 245 mg/dl — AB (ref 70–99)

## 2021-12-14 NOTE — Congregational Nurse Program (Signed)
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 Stopped in 2010   Tobacco use disorder 10/07/2014    Encounter Details:  CNP Questionnaire - 12/14/21 1251       Questionnaire   Do you give verbal consent to treat you today? Yes    Location Patient Served  [facility]    Visit Setting Church or Organization    Patient Status Homeless    Insurance [facility]    Insurance Referral [name]    Medication [name]    Medical Provider Yes    Screening Referrals [name]    Medical Referral [name]    Medical Appointment Made [name]    Food [name]    Transportation Provided transportation assistance    Housing/Utilities No permanent housing   Working with CM   Interpersonal Safety [name]    Intervention Support;Blood glucose;Blood pressure    ED Visit Averted [name]    Life-Saving Intervention Made [name]            Client came in nurse's office requesting blood pressure and glucose check. Blood pressure (!) 148/86, pulse 85. CBG 245. Client reports he ran out of his medication and has some ready for pick up. Gave bus passes to go get his medication. Client is currently staying in a motel provided by [facility] temporary housing assistance.

## 2021-12-19 ENCOUNTER — Encounter: Payer: Self-pay | Admitting: *Deleted

## 2021-12-19 DIAGNOSIS — Z139 Encounter for screening, unspecified: Secondary | ICD-10-CM

## 2021-12-19 LAB — GLUCOSE, POCT (MANUAL RESULT ENTRY): POC Glucose: 172 mg/dl — AB (ref 70–99)

## 2021-12-19 NOTE — Congregational Nurse Program (Signed)
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 Stopped in 2010   Tobacco use disorder 10/07/2014    Encounter Details:  CNP Questionnaire - 12/19/21 1512       Questionnaire   Do you give verbal consent to treat you today? Yes    Location Patient Served  [facility]    Visit Setting Church or Organization    Patient Status Homeless    Insurance [facility]    Insurance Referral [name]    Medication [name]    Medical Provider Yes    Screening Referrals [name]    Medical Referral [name]    Medical Appointment Made [name]    Food [name]    Transportation [name]    Housing/Utilities No permanent housing   Working with [employer]   Interpersonal Safety [name]    Intervention Support;Blood glucose;Blood pressure    ED Visit Averted [name]    Life-Saving Intervention Made [name]            Client came by nurse's office to have his blood pressure and blood sugar checked. Blood pressure (!) 154/102, pulse 94. CBG 172. Asked client to come back this afternoon and have blood pressure rechecked. He has taken his medication at 0400 this morning. Client came back around 1500 to have a recheck. BP 193/116. Pulse 94. Client reports he has not been eating but was drinking beer. Went to discuss with [facility] NP and client says he took another lisinopril while Clinical research associate was in NP office. Discussed s&s of stoke, importance of taking medication as prescribed and client is to have bp rechecked tomorrow by [facility] staff Rachael. Bathsheba Durrett W RN CN

## 2022-01-11 ENCOUNTER — Encounter: Payer: Self-pay | Admitting: Internal Medicine

## 2022-01-11 ENCOUNTER — Other Ambulatory Visit: Payer: Self-pay

## 2022-01-11 ENCOUNTER — Encounter: Payer: Self-pay | Admitting: *Deleted

## 2022-01-11 ENCOUNTER — Ambulatory Visit (INDEPENDENT_AMBULATORY_CARE_PROVIDER_SITE_OTHER): Payer: Medicaid Other | Admitting: Internal Medicine

## 2022-01-11 VITALS — BP 138/70 | HR 78 | Temp 97.5°F | Ht 68.0 in | Wt 228.1 lb

## 2022-01-11 DIAGNOSIS — E1136 Type 2 diabetes mellitus with diabetic cataract: Secondary | ICD-10-CM | POA: Diagnosis present

## 2022-01-11 DIAGNOSIS — Z125 Encounter for screening for malignant neoplasm of prostate: Secondary | ICD-10-CM | POA: Diagnosis not present

## 2022-01-11 DIAGNOSIS — E1142 Type 2 diabetes mellitus with diabetic polyneuropathy: Secondary | ICD-10-CM

## 2022-01-11 DIAGNOSIS — I10 Essential (primary) hypertension: Secondary | ICD-10-CM

## 2022-01-11 DIAGNOSIS — F1010 Alcohol abuse, uncomplicated: Secondary | ICD-10-CM | POA: Diagnosis not present

## 2022-01-11 DIAGNOSIS — E785 Hyperlipidemia, unspecified: Secondary | ICD-10-CM | POA: Diagnosis not present

## 2022-01-11 DIAGNOSIS — G6289 Other specified polyneuropathies: Secondary | ICD-10-CM

## 2022-01-11 DIAGNOSIS — M792 Neuralgia and neuritis, unspecified: Secondary | ICD-10-CM | POA: Diagnosis not present

## 2022-01-11 DIAGNOSIS — Z139 Encounter for screening, unspecified: Secondary | ICD-10-CM

## 2022-01-11 LAB — POCT GLYCOSYLATED HEMOGLOBIN (HGB A1C): Hemoglobin A1C: 6.6 % — AB (ref 4.0–5.6)

## 2022-01-11 LAB — GLUCOSE, CAPILLARY: Glucose-Capillary: 226 mg/dL — ABNORMAL HIGH (ref 70–99)

## 2022-01-11 LAB — GLUCOSE, POCT (MANUAL RESULT ENTRY): POC Glucose: 137 mg/dl — AB (ref 70–99)

## 2022-01-11 MED ORDER — MULTIVITAMIN-MINERALS PO TABS: 1.0000 | ORAL_TABLET | Freq: Every day | ORAL | 3 refills | Status: DC

## 2022-01-11 MED ORDER — VITAMIN B-1 100 MG PO TABS: 100.0000 mg | ORAL_TABLET | Freq: Every day | ORAL | 3 refills | Status: DC

## 2022-01-11 MED ORDER — FOLIC ACID 1 MG PO TABS: 1.0000 mg | ORAL_TABLET | Freq: Every day | ORAL | 3 refills | Status: DC

## 2022-01-11 MED ORDER — GABAPENTIN 300 MG PO CAPS: 300.0000 mg | ORAL_CAPSULE | Freq: Three times a day (TID) | ORAL | 11 refills | Status: DC

## 2022-01-11 MED ORDER — LISINOPRIL 40 MG PO TABS: 40.0000 mg | ORAL_TABLET | Freq: Every day | ORAL | 5 refills | Status: DC

## 2022-01-11 MED ORDER — ATORVASTATIN CALCIUM 80 MG PO TABS: ORAL_TABLET | ORAL | 3 refills | Status: DC

## 2022-01-11 NOTE — Progress Notes (Signed)
Subjective:   Patient ID: Brady Church male   DOB: 02-07-1961 61 y.o.   MRN: [id number]  HPI: Mr.Brady Church is a 61 y.o. male with past medical history outlined below here for follow up of diabetes and HTN. For the details of today's visit, please refer to the assessment and plan.  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 Stopped in 2010   Tobacco use disorder 10/07/2014   Current Outpatient Medications  Medication Sig Dispense Refill   ALPHAGAN P 0.1 % SOLN INSTILL 1 DROP INTO EACH EYE TWICE DAILY 5 mL 5   aspirin 81 MG EC tablet Take 1 tablet (81 mg total) by mouth daily. 30 tablet 5   atorvastatin (LIPITOR) 80 MG tablet TAKE 1 TABLET BY MOUTH ONCE DAILY AT  6  PM 30 tablet 5   dorzolamide-timolol (COSOPT) 22.3-6.8 MG/ML ophthalmic solution Place 1 drop into the right eye 2 (two) times daily. 10 mL 5   fluorometholone (FML) 0.1 % ophthalmic suspension INSTILL 1 DROP INTO EACH EYE ONCE DAILY 5 mL 5   fluticasone (FLONASE) 50 MCG/ACT nasal spray Place 1 spray into both nostrils daily. (Patient not taking: Reported on 10/10/2021) 16 g 1   folic acid (FOLVITE) 1 MG tablet Take 1 tablet (1 mg total) by mouth daily. 30 tablet 5   gabapentin (NEURONTIN) 300 MG capsule Take 1 capsule (300 mg total) by mouth 3 (three) times daily. 90 capsule 5   lisinopril (ZESTRIL) 40 MG tablet Take 1 tablet (40 mg total) by mouth daily. 30 tablet 5   Multiple Vitamins-Minerals (MULTIVITAMIN-MINERALS) TABS Take 1 tablet by mouth daily. 30 tablet 5   sildenafil (REVATIO) 20 MG tablet TAKE 2 TO 5 TABLETS BY MOUTH AS NEEDED 30  MINUTES  PRIOR  TO  SEXUAL  ACTIVITY 20 tablet 2   thiamine (VITAMIN B-1) 100 MG tablet Take 1 tablet (100 mg total) by mouth daily. 30 tablet 5   No current facility-administered medications for this visit.   Family History   Problem Relation Age of Onset   Coronary artery disease Mother 2       died of MI   Cancer Father 16       Some GI cancer. Not sure if it was Colon Cancer or not.   Hypertension Father    Diabetes Maternal Grandmother    Social History   Socioeconomic History   Marital status: Single    Spouse name: Not on file   Number of children: Not on file   Years of education: 12   Highest education level: Not on file  Occupational History   Not on file  Tobacco Use   Smoking status: Former    Packs/day: 0.50    Types: Cigarettes    Quit date: 02/18/2017    Years since quitting: 4.8   Smokeless tobacco: Never  Substance and Sexual Activity   Alcohol use: Yes    Comment: Beer- 40 oz. every day.   Drug use: Not Currently    Types: Cocaine, Marijuana    Comment: Sometimes.   Sexual activity: Not on file  Other Topics Concern   Not on file  Social History Narrative   Lives in [place] for last 10 years with "friend"  [name]  Is not married and does not have kids.   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   Social Determinants of Health   Financial Resource Strain: Not on file  Food Insecurity: Not on file  Transportation Needs: Not on file  Physical Activity: Not on file  Stress: Not on file  Social Connections: Not on file    Objective:  Physical Exam:  Vitals:   01/11/22 1038  BP: 138/70  Pulse: 78  Temp: (!) 97.5 F (36.4 C)  TempSrc: Oral  SpO2: 100%  Weight: 228 lb 1.6 oz (103.5 kg)  Height: 5\' 8"  (1.727 m)   Constitutional: NAD Cardiovascular: RRR Pulmonary/Chest: Clear bilaterally, normal effort Extremities: Bilateral feet warm and well-perfused, pulses intact.  Soft callus over his right midfoot.  Neurological: Right hand with decreased 4/5 grip strength and thenar atrophy.  Negative Tinel's sign.  Left hand with 5 out of 5 grip strength.  Assessment & Plan:    Hyperlipidemia LDL goal <100 Patient is prescribed Lipitor for secondary prevention with a history of CVA.  This was restarted at his last visit in November however he did not pick this up and has not been taking it.  He was asking about repeating a lipid panel today.  I reviewed his last lipid panel results with him and explained that he was above his LDL was above goal at tha time and advised that he start taking his medication before we repeat again.  I have sent in a prescription to his pharmacy. Recheck lipid panel at follow up.   Alcohol abuse Patient continues to drink alcohol daily. Reports drinking one 40 ounce or two 16 ounces of beer daily "when he can get it".  He denies withdrawal symptoms, he is not interested in quitting.  Advised he restart his multivitamin, thiamine, and folic acid supplements.  I have sent new prescriptions to his pharmacy.  Hypertension Chronic and well-controlled on lisinopril 40 mg daily.  BMP checked 3 months ago was within normal limits.  Continue current regimen  Diabetes mellitus with diabetic cataract (HCC) Diet controlled and stable, hemoglobin A1c today is 6.6.  Continue to monitor.  Peripheral neuropathy Refilled gabapentin 300 mg TID.  Neuropathic pain of hand, right Patient is complaining of worsening neuropathy and weakness in his right hand.  He is inquiring about physical therapy.  On exam he has thenar atrophy and decreased grip strength compared to the left.  The pain starts in his wrist and radiates down his hand.  It is most bothersome to him at night when sleeping. He is unable to tell me specifically which fingers are affected but thinks it's his whole hand. Tinel sign is negative. Despite this, I still have a moderate suspicion for carpal tunnel. He does have peripheral neuropathy from his diabetes however his current symptoms are unilateral. We discussed conservative management with bracing for now. Recommended wrist immobilization at  night while sleeping and during the day as much as possible. If symptoms do not improve, consider nerve conduction studies.   Prostate cancer screening Patient is interested in prostate cancer screening his next visit.  He reports occasional urinary frequency but denies other significant obstructive symptoms.  Reports 2 brothers with prostate cancer, but believes they died of some other unrelated malignancy.  I offered PSA screening today however he requested we do this at the next visit. Will address again at follow up.

## 2022-01-11 NOTE — Assessment & Plan Note (Signed)
Patient continues to drink alcohol daily. Reports drinking one 40 ounce or two 16 ounces of beer daily "when he can get it".  He denies withdrawal symptoms, he is not interested in quitting.  Advised he restart his multivitamin, thiamine, and folic acid supplements.  I have sent new prescriptions to his pharmacy.

## 2022-01-11 NOTE — Assessment & Plan Note (Signed)
Patient is complaining of worsening neuropathy and weakness in his right hand.  He is inquiring about physical therapy.  On exam he has thenar atrophy and decreased grip strength compared to the left.  The pain starts in his wrist and radiates down his hand.  It is most bothersome to him at night when sleeping. He is unable to tell me specifically which fingers are affected but thinks it's his whole hand. Tinel sign is negative. Despite this, I still have a moderate suspicion for carpal tunnel. He does have peripheral neuropathy from his diabetes however his current symptoms are unilateral. We discussed conservative management with bracing for now. Recommended wrist immobilization at night while sleeping and during the day as much as possible. If symptoms do not improve, consider nerve conduction studies.

## 2022-01-11 NOTE — Assessment & Plan Note (Signed)
Patient is prescribed Lipitor for secondary prevention with a history of CVA.  This was restarted at his last visit in November however he did not pick this up and has not been taking it.  He was asking about repeating a lipid panel today.  I reviewed his last lipid panel results with him and explained that he was above his LDL was above goal at tha time and advised that he start taking his medication before we repeat again.  I have sent in a prescription to his pharmacy. Recheck lipid panel at follow up.

## 2022-01-11 NOTE — Assessment & Plan Note (Signed)
Refilled gabapentin 300 mg TID

## 2022-01-11 NOTE — Assessment & Plan Note (Signed)
Chronic and well-controlled on lisinopril 40 mg daily.  BMP checked 3 months ago was within normal limits.  Continue current regimen

## 2022-01-11 NOTE — Congregational Nurse Program (Signed)
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 Stopped in 2010   Tobacco use disorder 10/07/2014    Encounter Details:  CNP Questionnaire - 01/11/22 0900       Questionnaire   Do you give verbal consent to treat you today? Yes    Location Patient Served  [facility]    Visit Setting Church or Organization    Patient Status Homeless    Insurance [facility]    Insurance Referral [name]    Medication [name]    Medical Provider Yes    Screening Referrals [name]    Medical Referral [name]    Medical Appointment Made [name]    Food [name]    Transportation Provided transportation assistance    Housing/Utilities No permanent housing   Working with CM   Interpersonal Safety [name]    Intervention Support;Blood glucose;Blood pressure    ED Visit Averted [name]    Life-Saving Intervention Made [name]            Client came to nurses's office to have blood pressure and cbg. He asked when his next appt was for his PCP.  Blood pressure (!) 159/101, pulse 72.CBG 137. Per epic, next appt with PCP is today at 10:15. Gave client two bus passes for transportation. Catriona Dillenbeck W RN CN

## 2022-01-11 NOTE — Assessment & Plan Note (Signed)
Diet controlled and stable, hemoglobin A1c today is 6.6.  Continue to monitor.

## 2022-01-11 NOTE — Patient Instructions (Signed)
Mr. Tardie,  It was a pleasure to see you. I have sent refills of your medications to your pharmacy. For your wrist, I recommend buying a wrist immobilization brace for carpal tunnel. Wear this every night while you sleep and during the day as much as you can. If symptoms do not improve, we can send you for further testing.   Follow up with me again in 3 months or sooner if you have any problems. If you have any questions or concerns, call our clinic at [phone] or after hours call [phone] and ask for the internal medicine resident on call.   Thank you!  DR. G

## 2022-01-11 NOTE — Assessment & Plan Note (Signed)
Patient is interested in prostate cancer screening his next visit.  He reports occasional urinary frequency but denies other significant obstructive symptoms.  Reports 2 brothers with prostate cancer, but believes they died of some other unrelated malignancy.  I offered PSA screening today however he requested we do this at the next visit. Will address again at follow up.

## 2022-01-20 ENCOUNTER — Other Ambulatory Visit: Payer: Self-pay

## 2022-01-20 ENCOUNTER — Encounter: Payer: Self-pay | Admitting: Podiatry

## 2022-01-20 ENCOUNTER — Ambulatory Visit (INDEPENDENT_AMBULATORY_CARE_PROVIDER_SITE_OTHER): Payer: Medicaid Other | Admitting: Podiatry

## 2022-01-20 ENCOUNTER — Encounter: Payer: Self-pay | Admitting: *Deleted

## 2022-01-20 DIAGNOSIS — B351 Tinea unguium: Secondary | ICD-10-CM

## 2022-01-20 DIAGNOSIS — Z139 Encounter for screening, unspecified: Secondary | ICD-10-CM

## 2022-01-20 DIAGNOSIS — M79676 Pain in unspecified toe(s): Secondary | ICD-10-CM | POA: Diagnosis not present

## 2022-01-20 DIAGNOSIS — E1142 Type 2 diabetes mellitus with diabetic polyneuropathy: Secondary | ICD-10-CM | POA: Diagnosis not present

## 2022-01-20 LAB — GLUCOSE, POCT (MANUAL RESULT ENTRY): POC Glucose: 220 mg/dl — AB (ref 70–99)

## 2022-01-20 NOTE — Congregational Nurse Program (Signed)
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 Stopped in 2010   Tobacco use disorder 10/07/2014    Encounter Details:  CNP Questionnaire - 01/20/22 0937       Questionnaire   Do you give verbal consent to treat you today? Yes    Location Patient Served  [facility]    Visit Setting Church or Organization    Patient Status Homeless    Insurance [facility]    Insurance Referral [name]    Medication [name]    Medical Provider Yes    Screening Referrals [name]    Medical Referral [name]    Medical Appointment Made [name]    Food [name]    Transportation Provided transportation assistance    Housing/Utilities No permanent housing   Working with CM   Interpersonal Safety [name]    Intervention Support;Blood glucose;Blood pressure    ED Visit Averted [name]    Life-Saving Intervention Made [name]            Client came to nurse's office for blood pressure and blood sugar check. Blood pressure (!) 152/93, pulse 83. CBG 220. Educated diet on better food choices. Gave bus passes for doctor's appt today with podiatrist. Bernita Raisin RN CN

## 2022-01-24 NOTE — Progress Notes (Signed)
°  Subjective:  Patient ID: Brady Church, male    DOB: 1961/11/22,  MRN: [id number]  61 y.o. male presents with at risk foot care with history of diabetic neuropathy and painful elongated mycotic toenails 1-5 bilaterally which are tender when wearing enclosed shoe gear. Pain is relieved with periodic professional debridement..    Patient's blood sugar was 220 mg/dl today.    Patient states he discussed one of his fingernails and  his toenails with his PCP and oral medication was mentioned as a possible treatment. Last liver enzyme testing performed over 90 days ago. He does admit to drinking 32 ounce bottle of beer daily.  New problem(s): None    PCP: [name], MD and last visit was:January 11, 2022.  Review of Systems: Negative except as noted in the HPI.   No Known Allergies  Objective:  There were no vitals filed for this visit. Constitutional Patient is a pleasant 61 y.o. African American male in NAD. AAO x 3.  Vascular Capillary fill time to digits immediate b/l.  DP/PT pulse(s) are palpable b/l lower extremities. Pedal hair sparse. Lower extremity skin temperature gradient within normal limits. No pain with calf compression b/l. No edema noted b/l lower extremities. No cyanosis or clubbing noted.   Neurologic Protective sensation diminished with 10g monofilament b/l.  Dermatologic Pedal skin is warm and supple b/l.  No open wounds b/l lower extremities. No interdigital macerations b/l lower extremities. Toenails 1-5 b/l elongated, discolored, dystrophic, thickened, crumbly with subungual debris and tenderness to dorsal palpation.  Mild hyperkeratosis b/l great toes.  Orthopedic: Normal muscle strength 5/5 to all lower extremity muscle groups bilaterally. Patient ambulates independent of any assistive aids. 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   Hemoglobin A1C Latest Ref Rng & Units 01/11/2022 10/12/2021 04/13/2021  HGBA1C 4.0 - 5.6 % 6.6(A)  6.4(A) 6.6(A)  Some recent data might be hidden   Assessment:   1. Pain due to onychomycosis of toenail   2. Diabetic peripheral neuropathy associated with type 2 diabetes mellitus (HCC)    Plan:  Patient was evaluated and treated and all questions answered. Consent given for treatment as described below: -We discussed oral terbinafine therapy for his mycotic toenails. Explained risk of elevation of liver enzymes with his current level of alcohol use as well as use of statin drug. Ordered LFT and CMP. Patient was given paperwork to have blood work drawn. Will contact him with results. -Medicaid ABN signed. Patient refuses services of corn(s)/callus(es)/porokeratos(es) today. Copy has been placed in patient chart. -Mycotic toenails 1-5 bilaterally were debrided in length and girth with sterile nail nippers and dremel without incident. -Patient/POA to call should there be question/concern in the interim.  Return in about 3 months (around 04/19/2022).  Brady Church, DPM

## 2022-01-31 ENCOUNTER — Other Ambulatory Visit: Payer: Self-pay | Admitting: Internal Medicine

## 2022-01-31 DIAGNOSIS — N529 Male erectile dysfunction, unspecified: Secondary | ICD-10-CM

## 2022-01-31 DIAGNOSIS — H409 Unspecified glaucoma: Secondary | ICD-10-CM

## 2022-02-06 NOTE — Telephone Encounter (Signed)
Refill Request-Pt states he has been trying since 01/31/2022 to get his refills. ? ? ?sildenafil (REVATIO) 20 MG tablet\\ ? ?dorzolamide-timolol (COSOPT) 22.3-6.8 MG/ML ophthalmic solution ?fluorometholone (FML) 0.1 % ophthalmic suspension ? ?[place], [place] (Ph: [phone]) ?

## 2022-02-07 NOTE — Telephone Encounter (Signed)
Call to patient message left on VM that his eye drop refill will need to go through his eye doctor.  Patient was asked to call the Clinics fr questions. ?

## 2022-02-07 NOTE — Telephone Encounter (Signed)
Declined refill for eyedrops, please have him call his ophthalmologist for this. Approved sildenafil.  ?

## 2022-02-15 ENCOUNTER — Encounter: Payer: Self-pay | Admitting: *Deleted

## 2022-02-15 DIAGNOSIS — Z139 Encounter for screening, unspecified: Secondary | ICD-10-CM

## 2022-02-15 LAB — GLUCOSE, POCT (MANUAL RESULT ENTRY): POC Glucose: 201 mg/dl — AB (ref 70–99)

## 2022-02-15 NOTE — Congregational Nurse Program (Signed)
?  Dept: [phone] ? ? ?Congregational Nurse Program Note ? ?Date of Encounter: 02/15/2022 ? ?Past Medical History: ?Past Medical History:  ?Diagnosis Date  ? Blind   ? blind in left eye, very limited vision right eye   ? Diabetes mellitus   ? High cholesterol   ? Hypertension   ? Substance abuse (HCC)   ? cocaine. Stopped in 2010  ? Tobacco use disorder 10/07/2014  ? ? ?Encounter Details: ? CNP Questionnaire - 02/15/22 0930   ? ?  ? Questionnaire  ? Do you give verbal consent to treat you today? Yes   ? Location [facility] Served  [facility]   ? Visit Setting Church or Organization   ? Patient Status Homeless   ? Insurance Medicaid   ? Insurance Referral [name]   ? Medication [name]   ? Medical Provider Yes   ? Screening Referrals [name]   ? Medical Referral [name]   ? Medical Appointment Made [name]   ? Food [name]   ? Transportation [name]   ? Housing/Utilities No permanent housing   currently staying at [facility]  ? Interpersonal Safety [name]   ? Intervention Blood glucose;Blood pressure;Educate;Support   ? ED Visit Averted [name]   ? Life-Saving Intervention Made [name]   ? ?  ?  ? ?  ? ?Client seen at [facility]. He had his vitals taken and CBG check. Blood pressure (!) 170/100, pulse 63. CBG 201. Client ate a large sausage link prior to visit. Educated client on food choices for hypertension and diabetes. Offered support and encouragement.  ?Omah Dewalt W RN CN ? ? ?

## 2022-02-20 ENCOUNTER — Encounter: Payer: Self-pay | Admitting: *Deleted

## 2022-02-20 DIAGNOSIS — Z139 Encounter for screening, unspecified: Secondary | ICD-10-CM

## 2022-02-20 LAB — GLUCOSE, POCT (MANUAL RESULT ENTRY): POC Glucose: 174 mg/dl — AB (ref 70–99)

## 2022-02-20 NOTE — Congregational Nurse Program (Signed)
?  Dept: [phone] ? ? ?Congregational Nurse Program Note ? ?Date of Encounter: 02/20/2022 ? ?Past Medical History: ?Past Medical History:  ?Diagnosis Date  ? Blind   ? blind in left eye, very limited vision right eye   ? Diabetes mellitus   ? High cholesterol   ? Hypertension   ? Substance abuse (HCC)   ? cocaine. Stopped in 2010  ? Tobacco use disorder 10/07/2014  ? ? ?Encounter Details: ? CNP Questionnaire - 02/20/22 1038   ? ?  ? Questionnaire  ? Do you give verbal consent to treat you today? Yes   ? Location [facility] Served  [facility]   ? Visit Setting Church or Organization   ? Patient Status Homeless   ? Insurance Medicaid   ? Insurance Referral [name]   ? Medication [name]   ? Medical Provider Yes   ? Screening Referrals [name]   ? Medical Referral [name]   ? Medical Appointment Made [name]   ? Food [name]   ? Transportation [name]   ? Housing/Utilities No permanent housing   currently staying at [facility]  ? Interpersonal Safety [name]   ? Intervention Blood glucose;Blood pressure;Educate;Support   ? ED Visit Averted [name]   ? Life-Saving Intervention Made [name]   ? ?  ?  ? ?  ? ?Client seen at [facility]. Client reports he is working on housing as his motel with [facility] is scheduled to end on 02/24/22. Checked vitals and cbg. Blood pressure (!) 151/100. CBG 174. Offered support and encouragement. Client reports no complaints or concerns.  ?Autumm Hattery W RN CN ? ? ? ?

## 2022-02-22 ENCOUNTER — Telehealth: Payer: Self-pay

## 2022-02-22 DIAGNOSIS — Z1152 Encounter for screening for COVID-19: Secondary | ICD-10-CM | POA: Diagnosis not present

## 2022-02-22 NOTE — Telephone Encounter (Signed)
Call to [facility]armacy patient has refills on file there.  Will do a refill for patient to pick up today.  Call to patient left message that refill for the Lisinopril is at [facility]. ?

## 2022-02-22 NOTE — Telephone Encounter (Signed)
lisinopril (ZESTRIL) ?[facility] - [place], [place] - [address] ?

## 2022-02-28 DIAGNOSIS — Z1152 Encounter for screening for COVID-19: Secondary | ICD-10-CM | POA: Diagnosis not present

## 2022-03-07 ENCOUNTER — Other Ambulatory Visit: Payer: Self-pay | Admitting: Internal Medicine

## 2022-03-07 DIAGNOSIS — N529 Male erectile dysfunction, unspecified: Secondary | ICD-10-CM

## 2022-03-07 NOTE — Telephone Encounter (Signed)
#  20 with 2 refills sent 3/14. Call placed to [facility] at [facility]. She will get this ready for patient now. ?

## 2022-03-07 NOTE — Telephone Encounter (Signed)
Refill Request-   sildenafil (REVATIO) 20 MG tablet  [facility] - [place], [place] - [address] (Ph: [phone]) 

## 2022-03-08 DIAGNOSIS — Z1152 Encounter for screening for COVID-19: Secondary | ICD-10-CM | POA: Diagnosis not present

## 2022-03-13 DIAGNOSIS — Z1152 Encounter for screening for COVID-19: Secondary | ICD-10-CM | POA: Diagnosis not present

## 2022-03-15 DIAGNOSIS — Z1152 Encounter for screening for COVID-19: Secondary | ICD-10-CM | POA: Diagnosis not present

## 2022-03-27 ENCOUNTER — Encounter: Payer: Self-pay | Admitting: *Deleted

## 2022-03-27 DIAGNOSIS — Z139 Encounter for screening, unspecified: Secondary | ICD-10-CM

## 2022-03-27 LAB — GLUCOSE, POCT (MANUAL RESULT ENTRY): POC Glucose: 193 mg/dl — AB (ref 70–99)

## 2022-03-27 NOTE — Congregational Nurse Program (Signed)
?  Dept: [phone] ? ? ?Congregational Nurse Program Note ? ?Date of Encounter: 03/27/2022 ? ?Past Medical History: ?Past Medical History:  ?Diagnosis Date  ? Blind   ? blind in left eye, very limited vision right eye   ? Diabetes mellitus   ? High cholesterol   ? Hypertension   ? Substance abuse (HCC)   ? cocaine. Stopped in 2010  ? Tobacco use disorder 10/07/2014  ? ? ?Encounter Details: ? CNP Questionnaire - 03/27/22 0945   ? ?  ? Questionnaire  ? Do you give verbal consent to treat you today? Yes   ? Location [facility] Served  [facility]   ? Visit Setting Church or Organization   ? Patient Status Homeless   staying in [place] motel provided by [facility]  ? Insurance Medicaid   ? Insurance Referral [name]   ? Medication [name]   ? Medical Provider Yes   ? Screening Referrals [name]   ? Medical Referral [name]   ? Medical Appointment Made [name]   ? Food [name]   ? Transportation [name]   ? Housing/Utilities No permanent housing   currently staying at [facility]  ? Interpersonal Safety [name]   ? Intervention Blood glucose;Blood pressure;Educate;Support   ? ED Visit Averted [name]   ? Life-Saving Intervention Made [name]   ? ?  ?  ? ?  ? ?Client came to nurse's office for blood pressure and blood sugar check. Blood pressure (!) 143/95, pulse 69. CBG 193. Educated client on diabetic food choices. Client reports drinking 40oz + beer daily. Discussed ways to decrease alcohol intake. Client has an upcoming appt with podiatrist. Offered support and encouragement. ?Shivaay Stormont W RN CN ? ? ?

## 2022-03-28 ENCOUNTER — Encounter: Payer: Self-pay | Admitting: Podiatry

## 2022-03-28 ENCOUNTER — Ambulatory Visit (INDEPENDENT_AMBULATORY_CARE_PROVIDER_SITE_OTHER): Payer: Medicaid Other | Admitting: Podiatry

## 2022-03-28 DIAGNOSIS — M79676 Pain in unspecified toe(s): Secondary | ICD-10-CM | POA: Diagnosis not present

## 2022-03-28 DIAGNOSIS — E1142 Type 2 diabetes mellitus with diabetic polyneuropathy: Secondary | ICD-10-CM | POA: Diagnosis not present

## 2022-03-28 DIAGNOSIS — B351 Tinea unguium: Secondary | ICD-10-CM | POA: Diagnosis not present

## 2022-04-04 DIAGNOSIS — Z1152 Encounter for screening for COVID-19: Secondary | ICD-10-CM | POA: Diagnosis not present

## 2022-04-05 ENCOUNTER — Encounter: Payer: Self-pay | Admitting: *Deleted

## 2022-04-05 NOTE — Progress Notes (Signed)
?  Subjective:  ?Patient ID: Ramsay Bognar, male    DOB: 1960/11/30,  MRN: [id number] ? ?Derk Doubek presents to clinic today for at risk foot care with history of diabetic neuropathy and painful elongated mycotic toenails 1-5 bilaterally which are tender when wearing enclosed shoe gear. Pain is relieved with periodic professional debridement. ? ?Last known HgA1c was 6.0%. Patient did not check blood glucose today. ? ?Patient did not have blood work drawn for LFT and CMP.  ? ?New problem(s): None.  ? ?PCP is [name], MD , and last visit was 2 months ago.. ? ?No Known Allergies ? ?Review of Systems: Negative except as noted in the HPI. ? ?Objective: No changes noted in today's physical examination. ? ?Constitutional Patient is a pleasant 61 y.o. African American male in NAD. AAO x 3.  ?Vascular Capillary fill time to digits immediate b/l.  DP/PT pulse(s) are palpable b/l lower extremities. Pedal hair sparse. Lower extremity skin temperature gradient within normal limits. No pain with calf compression b/l. No edema noted b/l lower extremities. No cyanosis or clubbing noted.   ?Neurologic Protective sensation diminished with 10g monofilament b/l.  ?Dermatologic Pedal skin is warm and supple b/l.  No open wounds b/l lower extremities. No interdigital macerations b/l lower extremities. Toenails 1-5 b/l elongated, discolored, dystrophic, thickened, crumbly with subungual debris and tenderness to dorsal palpation. Mild hyperkeratosis b/l great toes.  ?Orthopedic: Normal muscle strength 5/5 to all lower extremity muscle groups bilaterally. Patient ambulates independent of any assistive aids. 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 Uses white cane for sight impairment.  ? ? ?  Latest Ref Rng & Units 01/11/2022  ? 10:58 AM 10/12/2021  ? 11:03 AM 04/13/2021  ? 10:46 AM  ?Hemoglobin A1C  ?Hemoglobin-A1c 4.0 - 5.6 % 6.6   6.4   6.6    ? ?Assessment/Plan: ?1. Pain due to onychomycosis of  toenail   ?2. Diabetic peripheral neuropathy associated with type 2 diabetes mellitus (HCC)   ?  ?-Patient was evaluated and treated. All patient's and/or POA's questions/concerns answered on today's visit. ?-Medicaid ABN on file for refusal of corn/callus paring.. ?-Toenails 1-5 b/l were debrided in length and girth with sterile nail nippers and dremel without iatrogenic bleeding.  ?-Patient/POA to call should there be question/concern in the interim.  ? ?Return in about 3 months (around 06/28/2022). ? ?[name], DPM  ?

## 2022-04-05 NOTE — Congregational Nurse Program (Signed)
?  Dept: [phone] ? ? ?Congregational Nurse Program Note ? ?Date of Encounter: 04/05/2022 ? ?Past Medical History: ?Past Medical History:  ?Diagnosis Date  ? Blind   ? blind in left eye, very limited vision right eye   ? Diabetes mellitus   ? High cholesterol   ? Hypertension   ? Substance abuse ([place])   ? cocaine. Stopped in 2010  ? Tobacco use disorder 10/07/2014  ? ? ?Encounter Details: ? CNP Questionnaire - 04/05/22 1000   ? ?  ? Questionnaire  ? Do you give verbal consent to treat you today? Yes   ? Location [facility] Served  [facility]   ? Visit Setting Church or Organization   ? Patient Status Homeless   staying in [place] provided by [facility]  ? Insurance Medicaid   ? Insurance Referral [name]   ? Medication [name]   ? Medical Provider Yes   ? Screening Referrals [name]   ? Medical Referral [name]   ? Medical Appointment Made [name]   ? Food [name]   ? Transportation [name]   ? Housing/Utilities No permanent housing   currently staying at [facility]  ? Interpersonal Safety [name]   ? Intervention Blood glucose;Blood pressure;Educate;Support   ? ED Visit Averted [name]   ? Life-Saving Intervention Made [name]   ? ?  ?  ? ?  ?Client came to nurse's office for blood pressure and blood sugar check. Blood pressure (!) 152/78, pulse (!) 105. CBG 209. Educated client about food choices related to elevated blood pressure and blood sugar. Encouraged client to take medication as prescribed. ?[name] W RN CN ? ? ? ?

## 2022-04-11 ENCOUNTER — Other Ambulatory Visit: Payer: Self-pay | Admitting: Internal Medicine

## 2022-04-11 DIAGNOSIS — N529 Male erectile dysfunction, unspecified: Secondary | ICD-10-CM

## 2022-04-11 MED ORDER — SILDENAFIL CITRATE 20 MG PO TABS: ORAL_TABLET | ORAL | 2 refills | Status: DC

## 2022-04-11 NOTE — Telephone Encounter (Signed)
Pt requesting a refill on the following: ? ?sildenafil (REVATIO) 20 MG tablet ? ? ?[facility] - [place], [place] [address] ?

## 2022-04-12 ENCOUNTER — Encounter: Payer: Self-pay | Admitting: Internal Medicine

## 2022-04-12 ENCOUNTER — Ambulatory Visit (INDEPENDENT_AMBULATORY_CARE_PROVIDER_SITE_OTHER): Payer: Medicaid Other | Admitting: Internal Medicine

## 2022-04-12 ENCOUNTER — Other Ambulatory Visit: Payer: Self-pay

## 2022-04-12 VITALS — BP 144/90 | HR 72 | Temp 97.8°F | Ht 68.0 in | Wt 228.5 lb

## 2022-04-12 DIAGNOSIS — E1136 Type 2 diabetes mellitus with diabetic cataract: Secondary | ICD-10-CM

## 2022-04-12 DIAGNOSIS — E785 Hyperlipidemia, unspecified: Secondary | ICD-10-CM | POA: Diagnosis not present

## 2022-04-12 DIAGNOSIS — I63512 Cerebral infarction due to unspecified occlusion or stenosis of left middle cerebral artery: Secondary | ICD-10-CM

## 2022-04-12 DIAGNOSIS — F1010 Alcohol abuse, uncomplicated: Secondary | ICD-10-CM

## 2022-04-12 DIAGNOSIS — Z87891 Personal history of nicotine dependence: Secondary | ICD-10-CM

## 2022-04-12 DIAGNOSIS — Z125 Encounter for screening for malignant neoplasm of prostate: Secondary | ICD-10-CM | POA: Diagnosis not present

## 2022-04-12 LAB — POCT GLYCOSYLATED HEMOGLOBIN (HGB A1C): Hemoglobin A1C: 7.8 % — AB (ref 4.0–5.6)

## 2022-04-12 LAB — GLUCOSE, CAPILLARY: Glucose-Capillary: 196 mg/dL — ABNORMAL HIGH (ref 70–99)

## 2022-04-12 NOTE — Progress Notes (Signed)
Subjective:   Patient ID: Brady Church male   DOB: 08/22/1961 61 y.o.   MRN: [id number]  HPI: Brady Church is a 62 y.o. male with past medical history outlined below here for follow up of his diabetes and HTN. For the details of today's visit, please refer to the assessment and plan.   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 Stopped in 2010   Tobacco use disorder 10/07/2014   Current Outpatient Medications  Medication Sig Dispense Refill   ALPHAGAN P 0.1 % SOLN INSTILL 1 DROP INTO EACH EYE TWICE DAILY 5 mL 5   aspirin 81 MG EC tablet Take 1 tablet (81 mg total) by mouth daily. 30 tablet 5   atorvastatin (LIPITOR) 80 MG tablet TAKE 1 TABLET BY MOUTH ONCE DAILY AT  6  PM 90 tablet 3   dorzolamide-timolol (COSOPT) 22.3-6.8 MG/ML ophthalmic solution Place 1 drop into the right eye 2 (two) times daily. 10 mL 5   fluorometholone (FML) 0.1 % ophthalmic suspension INSTILL 1 DROP INTO EACH EYE ONCE DAILY 5 mL 5   fluticasone (FLONASE) 50 MCG/ACT nasal spray Place 1 spray into both nostrils daily. (Patient not taking: Reported on 10/10/2021) 16 g 1   folic acid (FOLVITE) 1 MG tablet Take 1 tablet (1 mg total) by mouth daily. 90 tablet 3   gabapentin (NEURONTIN) 300 MG capsule Take 1 capsule (300 mg total) by mouth 3 (three) times daily. 90 capsule 11   latanoprost (XALATAN) 0.005 % ophthalmic solution 1 drop at bedtime.     lisinopril (ZESTRIL) 40 MG tablet Take 1 tablet (40 mg total) by mouth daily. 30 tablet 5   Multiple Vitamins-Minerals (MULTIVITAMIN-MINERALS) TABS Take 1 tablet by mouth daily. 90 tablet 3   sildenafil (REVATIO) 20 MG tablet TAKE 2 TO 5 TABLETS BY MOUTH AS NEEDED FOR 30 MINUTES PRIOR TO SEXUAL ACTIVITY 20 tablet 2   thiamine (VITAMIN B-1) 100 MG tablet Take 1 tablet (100 mg total) by mouth daily. 90 tablet 3    No current facility-administered medications for this visit.   Family History  Problem Relation Age of Onset   Coronary artery disease Mother 1       died of MI   Cancer Father 83       Some GI cancer. Not sure if it was Colon Cancer or not.   Hypertension Father    Diabetes Maternal Grandmother    Social History   Socioeconomic History   Marital status: Single    Spouse name: Not on file   Number of children: Not on file   Years of education: 12   Highest education level: Not on file  Occupational History   Not on file  Tobacco Use   Smoking status: Former    Packs/day: 0.50    Types: Cigarettes    Quit date: 02/18/2017    Years since quitting: 5.1   Smokeless tobacco: Never  Substance and Sexual Activity   Alcohol use: Yes    Comment: Beer- 40 oz. every day.   Drug use: Not Currently    Types: Cocaine, Marijuana    Comment: Sometimes.   Sexual activity: Not on file  Other Topics Concern   Not on file  Social History Narrative   Lives in [place] for  last 10 years with "friend"  [name]      Is not married and does not have kids.   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   Social Determinants of Health   Financial Resource Strain: Not on file  Food Insecurity: Not on file  Transportation Needs: Not on file  Physical Activity: Not on file  Stress: Not on file  Social Connections: Not on file     Objective:  Physical Exam:  Vitals:   04/12/22 1040  BP: (!) 144/90  Pulse: 72  Temp: 97.8 F (36.6 C)  TempSrc: Oral  SpO2: 98%  Weight: 228 lb 8 oz (103.6 kg)  Height: 5\' 8"  (1.727 m)    Constitutional: NAD Cardiovascular: RRR Pulmonary/Chest: Clear bilaterally, normal effort Neurological: Bilateral upper extremity proximal strength 5/5. Right hand with decreased 4/5 grip strength.   Assessment & Plan:   Prostate cancer screening We discussed this at his prior visit  but delayed lab draw until this visit. He is still interested in screening. Patient is aware that if PSA is elevated he will require urology referral for possible prostate biopsy.  -- F/u PSA  Hyperlipidemia LDL goal <100 Patient is still not taking his statin. Patient is unhomed but he deneis barriers to obtaining his medication. We discussed the importance of compliance in the setting of his prior CVA. Will delay checking lipid panel until he is taking his statin. Continue to address at future visits.   Alcohol abuse Continues to drink beer daily, not interested in quitting.  Advise he restart his multivitamin, folate, and daily thiamine. Has refills at the pharmacy, says he will call to fill.   Diabetes mellitus with diabetic cataract (HCC) Previously "diet controlled" but repeat Hgb A1c today 7.8. He is un-homed and has unstable access to food. We have provided him with a bag of dry foods from our clinic pantry today. He is non complaint with his current medications. Will hold off on adding an additional agent. Plan to repeat Hgb A1c in 3 months and consider restarting metformin if persistently above goal.   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 Encouraged compliance with ASA and Lipitor.

## 2022-04-12 NOTE — Patient Instructions (Addendum)
Brady Church, ? ?It was a pleasure to see you. Please take your mediations as we discussed. I would encourage you to stop drinking or at least cut back on your alcohol consumption.  ? ?I will call you with the results of your blood work today. Please return to see me again in 3 months. ? ?If you have any questions or concerns, call our clinic at [phone] or after hours call [phone] and ask for the internal medicine resident on call.  ? ?Thank you! ? ?Dr. Reece Agar ?

## 2022-04-13 ENCOUNTER — Encounter: Payer: Self-pay | Admitting: Internal Medicine

## 2022-04-13 LAB — PSA: Prostate Specific Ag, Serum: 0.7 ng/mL (ref 0.0–4.0)

## 2022-04-13 NOTE — Assessment & Plan Note (Signed)
Previously "diet controlled" but repeat Hgb A1c today 7.8. He is un-homed and has unstable access to food. We have provided him with a bag of dry foods from our clinic pantry today. He is non complaint with his current medications. Will hold off on adding an additional agent. Plan to repeat Hgb A1c in 3 months and consider restarting metformin if persistently above goal.

## 2022-04-13 NOTE — Assessment & Plan Note (Signed)
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 Encouraged compliance with ASA and Lipitor.

## 2022-04-13 NOTE — Assessment & Plan Note (Signed)
Continues to drink beer daily, not interested in quitting.  Advise he restart his multivitamin, folate, and daily thiamine. Has refills at the pharmacy, says he will call to fill.

## 2022-04-13 NOTE — Assessment & Plan Note (Signed)
Patient is still not taking his statin. Patient is unhomed but he deneis barriers to obtaining his medication. We discussed the importance of compliance in the setting of his prior CVA. Will delay checking lipid panel until he is taking his statin. Continue to address at future visits.

## 2022-04-13 NOTE — Assessment & Plan Note (Signed)
We discussed this at his prior visit but delayed lab draw until this visit. He is still interested in screening. Patient is aware that if PSA is elevated he will require urology referral for possible prostate biopsy.  -- F/u PSA

## 2022-04-18 DIAGNOSIS — Z1152 Encounter for screening for COVID-19: Secondary | ICD-10-CM | POA: Diagnosis not present

## 2022-04-19 DIAGNOSIS — Z1152 Encounter for screening for COVID-19: Secondary | ICD-10-CM | POA: Diagnosis not present

## 2022-04-26 DIAGNOSIS — Z1152 Encounter for screening for COVID-19: Secondary | ICD-10-CM | POA: Diagnosis not present

## 2022-05-03 ENCOUNTER — Encounter: Payer: Self-pay | Admitting: *Deleted

## 2022-05-03 DIAGNOSIS — Z139 Encounter for screening, unspecified: Secondary | ICD-10-CM

## 2022-05-03 LAB — GLUCOSE, POCT (MANUAL RESULT ENTRY): POC Glucose: 177 mg/dl — AB (ref 70–99)

## 2022-05-03 NOTE — Congregational Nurse Program (Signed)
  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 Stopped in 2010   Tobacco use disorder 10/07/2014    Encounter Details:  CNP Questionnaire - 05/03/22 1006       Questionnaire   Do you give verbal consent to treat you today? Yes    Location Patient [place] or Organization    Patient Status Homeless   staying in [place] provided by [facility]    Insurance Referral [name]    Medication [name]    Medical Provider Yes    Screening Referrals [name]    Medical Referral [name]    Medical Appointment Made [name]    Food [name]    Transportation [name]    Housing/Utilities No permanent housing   currently staying at [facility]    Intervention Blood glucose;Blood pressure;Educate;Support    ED Visit Averted [name]    Life-Saving Intervention Made [name]            Client came to nurse's office for blood pressure and cbg check. Blood pressure 134/89, pulse 78. CBG 177. Offered support and encouragement. Client is working with [employer] team at [address] about housing. Elijan Googe W RN CN

## 2022-05-05 ENCOUNTER — Encounter: Payer: Self-pay | Admitting: *Deleted

## 2022-05-05 DIAGNOSIS — Z139 Encounter for screening, unspecified: Secondary | ICD-10-CM

## 2022-05-05 LAB — GLUCOSE, POCT (MANUAL RESULT ENTRY): POC Glucose: 189 mg/dl — AB (ref 70–99)

## 2022-05-05 NOTE — Congregational Nurse Program (Signed)
  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 Stopped in 2010   Tobacco use disorder 10/07/2014    Encounter Details:  CNP Questionnaire - 05/05/22 1019       Questionnaire   Do you give verbal consent to treat you today? Yes    Location Patient Served  [facility]    Visit Setting Church or Organization    Patient Status Homeless   staying in [place] motel provided by [facility]    Insurance Referral [name]    Medication [name]    Medical Provider Yes    Screening Referrals [name]    Medical Referral [name]    Medical Appointment Made [name]    Food [name]    Transportation [name]    Housing/Utilities No permanent housing   currently staying at [facility]   Interpersonal Safety [name]    Intervention Blood glucose;Blood pressure;Educate;Support    ED Visit Averted [name]    Life-Saving Intervention Made [name]            Client came by nurse's office for a blood pressure and blood sugar check. Blood pressure (!) 156/95, pulse 70. CBG 189. Client has not taken his blood pressure medication today and ate this morning. Encouraged client to take medication as prescribed. Educated client on food choices for diabetes. Indi Willhite W RN CN

## 2022-05-10 ENCOUNTER — Encounter: Payer: Self-pay | Admitting: *Deleted

## 2022-05-10 DIAGNOSIS — Z139 Encounter for screening, unspecified: Secondary | ICD-10-CM

## 2022-05-10 LAB — GLUCOSE, POCT (MANUAL RESULT ENTRY): POC Glucose: 151 mg/dl — AB (ref 70–99)

## 2022-05-10 NOTE — Congregational Nurse Program (Signed)
  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 Stopped in 2010   Tobacco use disorder 10/07/2014    Encounter Details:  CNP Questionnaire - 05/10/22 0912       Questionnaire   Do you give verbal consent to treat you today? Yes    Location Patient Served  [facility]    Visit Setting Church or Organization    Patient Status Homeless   staying in [place] motel provided by [facility]    Insurance Referral [name]    Medication [name]    Medical Provider Yes    Screening Referrals [name]    Medical Referral [name]    Medical Appointment Made [name]    Food [name]    Transportation [name]    Housing/Utilities No permanent housing   currently staying at [facility]   Interpersonal Safety [name]    Intervention Blood glucose;Blood pressure;Support;Educate    ED Visit Averted [name]    Life-Saving Intervention Made [name]               Dept: [phone]   Congregational Nurse Program Note  Date of Encounter: 05/10/2022  Past Medical History: Past Medical History:  Diagnosis Date   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 Stopped in 2010   Tobacco use disorder 10/07/2014    Encounter Details:  CNP Questionnaire - 05/10/22 0912       Questionnaire   Do you give verbal consent to treat you today? Yes    Location Patient Served  [facility]    Visit Setting Church or Organization    Patient Status Homeless   staying in [place] motel provided by [facility]    Insurance Referral [name]    Medication [name]    Medical Provider Yes    Screening Referrals [name]    Medical Referral [name]    Medical Appointment Made  [name]    Food [name]    Transportation [name]    Housing/Utilities No permanent housing   currently staying at [facility]   Interpersonal Safety [name]    Intervention Blood glucose;Blood pressure;Support;Educate    ED Visit Averted [name]    Life-Saving Intervention Made [name]            Client came to nurse's office for a vital and blood sugar check. Blood pressure (!) 173/99, pulse 77. CBG 151. Client reports he forgot to take his medications this morning. Educated client on importance of taking medications as prescribed. Offered support and encouragement.He reports he is going to get his medication and take this morning.

## 2022-05-16 DIAGNOSIS — Z1152 Encounter for screening for COVID-19: Secondary | ICD-10-CM | POA: Diagnosis not present

## 2022-05-17 ENCOUNTER — Encounter: Payer: Self-pay | Admitting: *Deleted

## 2022-05-17 DIAGNOSIS — Z139 Encounter for screening, unspecified: Secondary | ICD-10-CM

## 2022-05-17 LAB — GLUCOSE, POCT (MANUAL RESULT ENTRY): POC Glucose: 222 mg/dl — AB (ref 70–99)

## 2022-05-17 NOTE — Congregational Nurse Program (Signed)
  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 Stopped in 2010   Tobacco use disorder 10/07/2014    Encounter Details:  CNP Questionnaire - 05/17/22 0900       Questionnaire   Do you give verbal consent to treat you today? Yes    Location Patient Served  [facility]    Visit Setting Church or Organization    Patient Status Homeless   staying in [place] motel provided by [facility]    Insurance Referral [name]    Medication [name]    Medical Provider Yes    Screening Referrals [name]    Medical Referral [name]    Medical Appointment Made [name]    Food [name]    Transportation [name]    Housing/Utilities No permanent housing   currently staying at [facility]   Interpersonal Safety [name]    Intervention Blood glucose;Blood pressure;Support;Educate    ED Visit Averted [name]    Life-Saving Intervention Made [name]            Client came to nurse's office for a blood pressure and blood sugar check. Blood pressure (!) 146/93, pulse 85. CBG 222. Educated client on better food choices for diabetes. Lealand Elting W RN CN

## 2022-05-19 ENCOUNTER — Encounter: Payer: Self-pay | Admitting: *Deleted

## 2022-05-19 DIAGNOSIS — Z139 Encounter for screening, unspecified: Secondary | ICD-10-CM

## 2022-05-19 LAB — GLUCOSE, POCT (MANUAL RESULT ENTRY): POC Glucose: 215 mg/dl — AB (ref 70–99)

## 2022-05-29 ENCOUNTER — Encounter: Payer: Self-pay | Admitting: *Deleted

## 2022-05-29 DIAGNOSIS — Z139 Encounter for screening, unspecified: Secondary | ICD-10-CM

## 2022-05-29 LAB — GLUCOSE, POCT (MANUAL RESULT ENTRY): POC Glucose: 282 mg/dl — AB (ref 70–99)

## 2022-05-29 NOTE — Congregational Nurse Program (Signed)
  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 Stopped in 2010   Tobacco use disorder 10/07/2014    Encounter Details:  CNP Questionnaire - 05/29/22 1000       Questionnaire   Do you give verbal consent to treat you today? Yes    Location Patient Served  [facility]    Visit Setting Church or Organization    Patient Status Homeless   staying in [place] motel provided by [facility]    Insurance Referral [name]    Medication [name]    Medical Provider Yes    Screening Referrals [name]    Medical Referral [name]    Medical Appointment Made [name]    Food [name]    Transportation [name]    Housing/Utilities No permanent housing   currently staying at [facility]   Interpersonal Safety [name]    Intervention Blood glucose;Blood pressure;Support;Educate    ED Visit Averted [name]    Life-Saving Intervention Made [name]            Client had blood pressure and blood sugar check while at [facility]. Blood pressure 117/69, pulse 81. Blood pressure 117/69, pulse 81. CBG 282. Educated client on food choices for diabetes. Client had drank two orange juices prior to coming in [facility]. Terrion Gencarelli W RN CN

## 2022-05-31 ENCOUNTER — Encounter: Payer: Self-pay | Admitting: *Deleted

## 2022-05-31 DIAGNOSIS — Z139 Encounter for screening, unspecified: Secondary | ICD-10-CM

## 2022-05-31 LAB — GLUCOSE, POCT (MANUAL RESULT ENTRY): POC Glucose: 226 mg/dl — AB (ref 70–99)

## 2022-05-31 NOTE — Congregational Nurse Program (Signed)
  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 Stopped in 2010   Tobacco use disorder 10/07/2014    Encounter Details:  CNP Questionnaire - 05/31/22 0918       Questionnaire   Do you give verbal consent to treat you today? Yes    Location Patient Served  [facility]    Visit Setting Church or Organization    Patient Status Homeless   staying in [place] motel provided by [facility]    Insurance Referral [name]    Medication [name]    Medical Provider Yes    Screening Referrals [name]    Medical Referral [name]    Medical Appointment Made [name]    Food [name]    Transportation [name]    Housing/Utilities No permanent housing   currently staying at [facility]   Interpersonal Safety [name]    Intervention Blood glucose;Blood pressure;Support;Educate    ED Visit Averted [name]    Life-Saving Intervention Made [name]            Client came to nurse's office to have BP and CBG check. Blood pressure 120/70, pulse 83. CBG 226. Offered education, support and encouragement. Navin Dogan W RN CN

## 2022-06-02 ENCOUNTER — Encounter: Payer: Self-pay | Admitting: *Deleted

## 2022-06-02 DIAGNOSIS — Z139 Encounter for screening, unspecified: Secondary | ICD-10-CM

## 2022-06-02 LAB — GLUCOSE, POCT (MANUAL RESULT ENTRY): POC Glucose: 227 mg/dl — AB (ref 70–99)

## 2022-06-02 NOTE — Congregational Nurse Program (Signed)
  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 Stopped in 2010   Tobacco use disorder 10/07/2014    Encounter Details:  CNP Questionnaire - 06/02/22 1014       Questionnaire   Do you give verbal consent to treat you today? Yes    Location Patient Served  [facility]    Visit Setting Church or Organization    Patient Status Homeless   staying in [place] motel provided by [facility]    Insurance Referral [name]    Medication [name]    Medical Provider Yes    Screening Referrals [name]    Medical Referral [name]    Medical Appointment Made [name]    Food [name]    Transportation [name]    Housing/Utilities No permanent housing   currently staying at [facility]   Interpersonal Safety [name]    Intervention Blood glucose;Blood pressure;Support;Educate;Counsel    ED Visit Averted [name]    Life-Saving Intervention Made [name]            Client came to office to have BP and CBG check. He also wanted to talk about frustrations with housing situation. Blood pressure 104/69, pulse 88. CBG 227. Discussed budget plan to save for winter housing. Reinforced diabetic food choices. Juergen Hardenbrook W RN CN

## 2022-06-09 ENCOUNTER — Encounter: Payer: Self-pay | Admitting: *Deleted

## 2022-06-09 DIAGNOSIS — Z139 Encounter for screening, unspecified: Secondary | ICD-10-CM

## 2022-06-09 LAB — GLUCOSE, POCT (MANUAL RESULT ENTRY): POC Glucose: 220 mg/dl — AB (ref 70–99)

## 2022-06-09 NOTE — Congregational Nurse Program (Signed)
  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 Stopped in 2010   Tobacco use disorder 10/07/2014    Encounter Details:  CNP Questionnaire - 06/09/22 1105       Questionnaire   Do you give verbal consent to treat you today? Yes    Location Patient Served  [facility]    Visit Setting Church or Organization    Patient Status Homeless   staying in [place] motel provided by [facility]    Insurance Referral [name]    Medication [name]    Medical Provider Yes    Screening Referrals [name]    Medical Referral [name]    Medical Appointment Made [name]    Food [name]    Transportation [name]    Housing/Utilities No permanent housing   currently staying at [facility]   Interpersonal Safety [name]    Intervention Blood glucose;Blood pressure;Support;Educate;Counsel    ED Visit Averted [name]    Life-Saving Intervention Made [name]            Client came to nurse's office for blood pressure and blood sugar check. Blood pressure 130/81, pulse 91. CBG 220. Client ate egg, [place] sausage, biscuit and jelly for breakfast. While in office client reported he was bitten last night and thinks he slept next to an ant hill. Client has multiple small areas on his rt arm that appear to be bites and redness where he has been scratching. Discussed getting ointment at near by drug store for bites. Gave insect spray repellant from [employer] to spray around sleeping area. Gave a mat and blanket.   Tosh Glaze W RN CN

## 2022-06-19 ENCOUNTER — Encounter: Payer: Self-pay | Admitting: *Deleted

## 2022-06-19 DIAGNOSIS — Z139 Encounter for screening, unspecified: Secondary | ICD-10-CM

## 2022-06-19 LAB — GLUCOSE, POCT (MANUAL RESULT ENTRY): POC Glucose: 287 mg/dl — AB (ref 70–99)

## 2022-06-19 NOTE — Congregational Nurse Program (Signed)
  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 Stopped in 2010   Tobacco use disorder 10/07/2014    Encounter Details:  CNP Questionnaire - 06/19/22 0830       Questionnaire   Do you give verbal consent to treat you today? Yes    Location Patient Served  [facility]    Visit Setting Church or Organization    Patient Status Homeless   staying in [place] motel provided by [facility]    Insurance Referral [name]    Medication [name]    Medical Provider Yes    Screening Referrals [name]    Medical Referral [name]    Medical Appointment Made [name]    Food [name]    Transportation [name]    Housing/Utilities No permanent housing   currently staying at [facility]   Interpersonal Safety [name]    Intervention Blood glucose;Blood pressure;Support;Educate;Counsel    ED Visit Averted [name]    Life-Saving Intervention Made [name]            Client came to nurse's office for blood pressure and blood sugar check. Blood pressure (!) 139/95, pulse (!) 105. CBG 287. Encouraged client to take medications as prescribed and choose better food choices for people with diabetes.  Jaz Laningham W RN CN

## 2022-06-21 ENCOUNTER — Encounter: Payer: Self-pay | Admitting: *Deleted

## 2022-06-21 DIAGNOSIS — Z139 Encounter for screening, unspecified: Secondary | ICD-10-CM

## 2022-06-21 LAB — GLUCOSE, POCT (MANUAL RESULT ENTRY): POC Glucose: 284 mg/dl — AB (ref 70–99)

## 2022-06-21 NOTE — Congregational Nurse Program (Signed)
  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 Stopped in 2010   Tobacco use disorder 10/07/2014    Encounter Details:  CNP Questionnaire - 06/21/22 0959       Questionnaire   Do you give verbal consent to treat you today? Yes    Location Patient Served  [facility]    Visit Setting Church or Organization    Patient Status Homeless   staying in [place] motel provided by [facility]    Insurance Referral [name]    Medication [name]    Medical Provider Yes    Screening Referrals [name]    Medical Referral [name]    Medical Appointment Made [name]    Food [name]    Transportation [name]    Housing/Utilities No permanent housing   currently staying at [facility]   Interpersonal Safety [name]    Intervention Blood glucose;Blood pressure;Support;Educate;Counsel    ED Visit Averted [name]    Life-Saving Intervention Made [name]           Client came to nurse's office requesting blood pressure and blood sugar check. Blood pressure 123/78, pulse 90. CBG 284. Client reports eating a peanut butter and chocolate candy bar this morning. Will continue to educate client about healthy food choices for diabetes. Discussed how elevated sugar effects systems of the body. Offered support and encouragement. Shawnita Krizek W RN CN

## 2022-06-30 ENCOUNTER — Encounter: Payer: Self-pay | Admitting: Podiatry

## 2022-06-30 ENCOUNTER — Ambulatory Visit (INDEPENDENT_AMBULATORY_CARE_PROVIDER_SITE_OTHER): Payer: Medicaid Other | Admitting: Podiatry

## 2022-06-30 DIAGNOSIS — M79675 Pain in left toe(s): Secondary | ICD-10-CM | POA: Diagnosis not present

## 2022-06-30 DIAGNOSIS — E1142 Type 2 diabetes mellitus with diabetic polyneuropathy: Secondary | ICD-10-CM

## 2022-06-30 DIAGNOSIS — L84 Corns and callosities: Secondary | ICD-10-CM

## 2022-06-30 DIAGNOSIS — M79674 Pain in right toe(s): Secondary | ICD-10-CM | POA: Diagnosis not present

## 2022-06-30 DIAGNOSIS — B351 Tinea unguium: Secondary | ICD-10-CM

## 2022-07-05 ENCOUNTER — Ambulatory Visit (INDEPENDENT_AMBULATORY_CARE_PROVIDER_SITE_OTHER): Payer: Medicaid Other | Admitting: Internal Medicine

## 2022-07-05 ENCOUNTER — Encounter: Payer: Self-pay | Admitting: Internal Medicine

## 2022-07-05 ENCOUNTER — Encounter: Payer: Self-pay | Admitting: *Deleted

## 2022-07-05 ENCOUNTER — Other Ambulatory Visit: Payer: Self-pay

## 2022-07-05 VITALS — BP 147/98 | HR 68 | Temp 97.8°F | Ht 68.0 in | Wt 219.6 lb

## 2022-07-05 DIAGNOSIS — Z87891 Personal history of nicotine dependence: Secondary | ICD-10-CM | POA: Diagnosis not present

## 2022-07-05 DIAGNOSIS — F1010 Alcohol abuse, uncomplicated: Secondary | ICD-10-CM | POA: Diagnosis not present

## 2022-07-05 DIAGNOSIS — Z23 Encounter for immunization: Secondary | ICD-10-CM | POA: Diagnosis not present

## 2022-07-05 DIAGNOSIS — N529 Male erectile dysfunction, unspecified: Secondary | ICD-10-CM

## 2022-07-05 DIAGNOSIS — E785 Hyperlipidemia, unspecified: Secondary | ICD-10-CM

## 2022-07-05 DIAGNOSIS — I10 Essential (primary) hypertension: Secondary | ICD-10-CM

## 2022-07-05 DIAGNOSIS — E1136 Type 2 diabetes mellitus with diabetic cataract: Secondary | ICD-10-CM | POA: Diagnosis not present

## 2022-07-05 LAB — POCT GLYCOSYLATED HEMOGLOBIN (HGB A1C): Hemoglobin A1C: 8 % — AB (ref 4.0–5.6)

## 2022-07-05 LAB — GLUCOSE, CAPILLARY: Glucose-Capillary: 183 mg/dL — ABNORMAL HIGH (ref 70–99)

## 2022-07-05 MED ORDER — HYDROCHLOROTHIAZIDE 25 MG PO TABS: 25.0000 mg | ORAL_TABLET | Freq: Every day | ORAL | 0 refills | Status: DC

## 2022-07-05 MED ORDER — ATORVASTATIN CALCIUM 80 MG PO TABS: ORAL_TABLET | ORAL | 3 refills | Status: DC

## 2022-07-05 MED ORDER — SILDENAFIL CITRATE 20 MG PO TABS: ORAL_TABLET | ORAL | 2 refills | Status: DC

## 2022-07-05 MED ORDER — METFORMIN HCL 500 MG PO TABS: 500.0000 mg | ORAL_TABLET | Freq: Two times a day (BID) | ORAL | 0 refills | Status: DC

## 2022-07-05 MED ORDER — MULTIVITAMIN GUMMIES ADULT PO CHEW: 1.0000 | CHEWABLE_TABLET | Freq: Every day | ORAL | 3 refills | Status: DC

## 2022-07-05 NOTE — Assessment & Plan Note (Signed)
Continues to drink daily. Not interested in quitting. Checking CMP.

## 2022-07-05 NOTE — Patient Instructions (Addendum)
Brady Church,  It was a pleasure to see you today. Today we discussed:   Diabetes: Your hemoglobin A1c has increased to 8.0. Please restart metformin. You can take 1 tablet once a day for 1 week then increase to twice a day if you are tolerating this well. Follow up with me again in 3 months.  High Blood Pressure: Your blood pressure has been high now for the past two visits. Please continue taking lisinopril daily. I have added another medicine called hydrochlorothiazide. Please take this once a day in addition to lisinopril.   High Cholesterol: Please start taking your atorvastatin. It is very important for [place] to lower your cholesterol since you have already had a stroke. I will repeat your cholesterol levels at your next visit once you have been consistently taking your medicine.   I will call you with the results of your blood work.   If you have any questions or concerns, call our clinic at [phone] or after hours call [phone] and ask for the internal medicine resident on call.   Thank you!  Dr. Reece Agar

## 2022-07-05 NOTE — Assessment & Plan Note (Signed)
Patient is still not taking his statin. We discussed the importance of this medication given his history of CVA. He is agreeable to restart. Sent new Rx. Recheck lipid panel at follow up.

## 2022-07-05 NOTE — Assessment & Plan Note (Signed)
Hemoglobin A1c is up to 8.0 today. Previously diet controlled. He is willing to restart metformin. Sent Rx for 500 mg once a day x 1 week then BID. Repeat Hgb A1c in 3 months.

## 2022-07-05 NOTE — Progress Notes (Signed)
Subjective:   Patient ID: Brady Church male   DOB: [birth year] 61 y.o.   MRN: [id number]  HPI: Mr.Brady Church is a 61 y.o. male with past medical history outlined below here for HTN and DM follow up. For the details of today's visit, please refer to the assessment and plan.   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 Stopped in 2010   Tobacco use disorder 10/07/2014   Current Outpatient Medications  Medication Sig Dispense Refill   hydrochlorothiazide (HYDRODIURIL) 25 MG tablet Take 1 tablet (25 mg total) by mouth daily. 90 tablet 0   metFORMIN (GLUCOPHAGE) 500 MG tablet Take 1 tablet (500 mg total) by mouth 2 (two) times daily with a meal. 180 tablet 0   Multiple Vitamins-Minerals (MULTIVITAMIN GUMMIES ADULT) CHEW Chew 1 Dose by mouth daily. 90 tablet 3   ALPHAGAN P 0.1 % SOLN INSTILL 1 DROP INTO EACH EYE TWICE DAILY 5 mL 5   aspirin 81 MG EC tablet Take 1 tablet (81 mg total) by mouth daily. 30 tablet 5   atorvastatin (LIPITOR) 80 MG tablet TAKE 1 TABLET BY MOUTH ONCE DAILY AT  6  PM 90 tablet 3   dorzolamide-timolol (COSOPT) 22.3-6.8 MG/ML ophthalmic solution Place 1 drop into the right eye 2 (two) times daily. 10 mL 5   fluorometholone (FML) 0.1 % ophthalmic suspension INSTILL 1 DROP INTO EACH EYE ONCE DAILY 5 mL 5   fluticasone (FLONASE) 50 MCG/ACT nasal spray Place 1 spray into both nostrils daily. (Patient not taking: Reported on 10/10/2021) 16 g 1   folic acid (FOLVITE) 1 MG tablet Take 1 tablet (1 mg total) by mouth daily. 90 tablet 3   gabapentin (NEURONTIN) 300 MG capsule Take 1 capsule (300 mg total) by mouth 3 (three) times daily. 90 capsule 11   latanoprost (XALATAN) 0.005 % ophthalmic solution 1 drop at bedtime.     lisinopril (ZESTRIL) 40 MG tablet Take 1 tablet (40 mg total) by mouth daily. 30 tablet 5    sildenafil (REVATIO) 20 MG tablet TAKE 2 TO 5 TABLETS BY MOUTH AS NEEDED FOR 30 MINUTES PRIOR TO SEXUAL ACTIVITY 20 tablet 2   thiamine (VITAMIN B-1) 100 MG tablet Take 1 tablet (100 mg total) by mouth daily. 90 tablet 3   No current facility-administered medications for this visit.   Family History  Problem Relation Age of Onset   Coronary artery disease Mother 25       died of MI   Cancer Father 21       Some GI cancer. Not sure if it was Colon Cancer or not.   Hypertension Father    Diabetes Maternal Grandmother    Social History   Socioeconomic History   Marital status: Single    Spouse name: Not on file   Number of children: Not on file   Years of education: 12   Highest education level: Not on file  Occupational History   Not on file  Tobacco Use   Smoking status: Former    Packs/day: 0.50    Types: Cigarettes    Quit date: 02/18/2017    Years since quitting: 5.3   Smokeless tobacco: Never  Substance and Sexual Activity   Alcohol use: Yes    Comment: Beer- 40 oz. every  day.   Drug use: Not Currently    Types: Cocaine, Marijuana    Comment: Sometimes.   Sexual activity: Not on file  Other Topics Concern   Not on file  Social History Narrative   Lives in [place] for last 10 years with "friend"  [name]      Is not married and does not have kids.   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   Social Determinants of Health   Financial Resource Strain: Not on file  Food Insecurity: Not on file  Transportation Needs: Not on file  Physical Activity: Not on file  Stress: Not on file  Social Connections: Not on file    Objective:  Physical Exam:  Vitals:   07/05/22 0937 07/05/22 1019  BP: (!) 146/96 (!) 147/98  Pulse: 84 68  Temp: 97.8 F (36.6 C)   TempSrc: Oral   SpO2: 97%   Weight: 219 lb 9.6 oz (99.6 kg)   Height: 5\' 8"  (1.727 m)     Constitutional: Well appearing,  NAD Cardiovascular: RRR, no m/r/g Pulmonary/Chest: Clear bilaterally, normal effort Extremities: Warm, no edema   Assessment & Plan:   Hyperlipidemia LDL goal <100 Patient is still not taking his statin. We discussed the importance of this medication given his history of CVA. He is agreeable to restart. Sent new Rx. Recheck lipid panel at follow up.   Hypertension Uncontrolled today and persistently elevated on recheck. Say she has not yet taken his lisinopril today, but follows with the congregational & community nurse program through the [facility] and his BP is well controlled at those checks. We discussed adding HCTZ at his next visit if still uncontrolled. Encouraged him to take his medicine prior to our visits together.  Checking CMP. Continue lisinopril 40 mg daily.   Alcohol abuse Continues to drink daily. Not interested in quitting. Checking CMP.   Diabetes mellitus with diabetic cataract (HCC) Hemoglobin A1c is up to 8.0 today. Previously diet controlled. He is willing to restart metformin. Sent Rx for 500 mg once a day x 1 week then BID. Repeat Hgb A1c in 3 months.   Erectile dysfunction Refilled sildenafil PRN.

## 2022-07-05 NOTE — Assessment & Plan Note (Signed)
Uncontrolled today and persistently elevated on recheck. Say she has not yet taken his lisinopril today, but follows with the congregational & community nurse program through the [facility] and his BP is well controlled at those checks. We discussed adding HCTZ at his next visit if still uncontrolled. Encouraged him to take his medicine prior to our visits together.  Checking CMP. Continue lisinopril 40 mg daily.
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
## 2022-07-05 NOTE — Assessment & Plan Note (Signed)
Refilled sildenafil PRN.  

## 2022-07-06 LAB — CMP14 + ANION GAP
ALT: 22 IU/L (ref 0–44)
AST: 26 IU/L (ref 0–40)
Albumin/Globulin Ratio: 1.5 (ref 1.2–2.2)
Albumin: 4.4 g/dL (ref 3.8–4.9)
Alkaline Phosphatase: 69 IU/L (ref 44–121)
Anion Gap: 17 mmol/L (ref 10.0–18.0)
BUN/Creatinine Ratio: 15 (ref 9–20)
BUN: 16 mg/dL (ref 6–24)
Bilirubin Total: 0.3 mg/dL (ref 0.0–1.2)
CO2: 22 mmol/L (ref 20–29)
Calcium: 9.7 mg/dL (ref 8.7–10.2)
Chloride: 103 mmol/L (ref 96–106)
Creatinine, Ser: 1.04 mg/dL (ref 0.76–1.27)
Globulin, Total: 2.9 g/dL (ref 1.5–4.5)
Glucose: 175 mg/dL — ABNORMAL HIGH (ref 70–99)
Potassium: 4.1 mmol/L (ref 3.5–5.2)
Sodium: 142 mmol/L (ref 134–144)
Total Protein: 7.3 g/dL (ref 6.0–8.5)
eGFR: 83 mL/min/{1.73_m2} (ref >59)

## 2022-07-07 ENCOUNTER — Encounter: Payer: Self-pay | Admitting: *Deleted

## 2022-07-07 DIAGNOSIS — Z139 Encounter for screening, unspecified: Secondary | ICD-10-CM

## 2022-07-07 LAB — GLUCOSE, POCT (MANUAL RESULT ENTRY): POC Glucose: 274 mg/dl — AB (ref 70–99)

## 2022-07-07 NOTE — Congregational Nurse Program (Signed)
  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 Stopped in 2010   Tobacco use disorder 10/07/2014    Encounter Details:  CNP Questionnaire - 07/07/22 1000       Questionnaire   Do you give verbal consent to treat you today? Yes    Location Patient Served  [facility]    Visit Setting Church or Organization    Patient Status Homeless   staying in [place] motel provided by [facility]    Insurance Referral [name]    Medication [name]    Medical Provider Yes    Screening Referrals [name]    Medical Referral [name]    Medical Appointment Made [name]    Food [name]    Transportation Need transportation assistance;Provided transportation assistance    Housing/Utilities No permanent housing   currently staying at [facility]   Interpersonal Safety [name]    Intervention Support;Blood pressure;Blood glucose;Educate    ED Visit Averted [name]    Life-Saving Intervention Made [name]            Client came by nurse's office for a blood pressure and blood sugar check. He reports he has an eye appt later today. Blood pressure (!) 148/95, pulse 85. CBG 274. Client reports he has not taken his blood pressure medication yet today and has metformin to pick up at pharmacy. He does not have money to pay $4 to pickup. Explained client could ask for a waiver and pay when he gets his check at beginning of next month. Client left to take his blood pressure medicine and go to pharmacy. Gave bus passes for transportation. Pattrick Bady W RN CN

## 2022-07-08 NOTE — Progress Notes (Signed)
  Subjective:  Patient ID: Brady Church, male    DOB: 02-10-1961,  MRN: [id number]  Brady Church presents to clinic today for at risk foot care with history of diabetic neuropathy and painful elongated mycotic toenails 1-5 bilaterally which are tender when wearing enclosed shoe gear. Pain is relieved with periodic professional debridement.  Last A1c was 6.1%. Last known HgA1c was unknown.   New problem(s): None.   PCP is [name], MD , and last visit was  July 05, 2022  No Known Allergies  Review of Systems: Negative except as noted in the HPI.  Objective: No changes noted in today's physical examination. Constitutional Patient is a pleasant 61 y.o. African American male in NAD. AAO x 3.  Vascular Capillary fill time to digits immediate b/l.  DP/PT pulse(s) are palpable b/l lower extremities. Pedal hair sparse. Lower extremity skin temperature gradient within normal limits. No pain with calf compression b/l. No edema noted b/l lower extremities. No cyanosis or clubbing noted.   Neurologic Protective sensation diminished with 10g monofilament b/l.  Dermatologic Pedal skin is warm and supple b/l.  No open wounds b/l lower extremities. No interdigital macerations b/l lower extremities. Toenails 1-5 b/l elongated, discolored, dystrophic, thickened, crumbly with subungual debris and tenderness to dorsal palpation. Hyperkeratotic lesion(s) medial IPJ of left great toe, medial IPJ of right great toe, and medial DIPJ of bilateral 2nd toes.  No erythema, no edema, no drainage, no fluctuance.   Orthopedic: Normal muscle strength 5/5 to all lower extremity muscle groups bilaterally. Patient ambulates independent of any assistive aids. HAV with bunion deformity noted b/l LE. Hammertoe(s) noted to the bilateral 2nd toes. 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 Uses white cane for sight impairment.      Latest Ref Rng & Units 07/05/2022   10:04 AM 04/12/2022   11:09  AM 01/11/2022   10:58 AM 10/12/2021   11:03 AM  Hemoglobin A1C  Hemoglobin-A1c 4.0 - 5.6 % 8.0  7.8  6.6  6.4    Assessment/Plan: 1. Pain due to onychomycosis of toenail   2. Corns and callosities   3. Diabetic peripheral neuropathy associated with type 2 diabetes mellitus (HCC)   -Examined patient. -Patient to continue soft, supportive shoe gear daily. -Toenails 1-5 b/l were debrided in length and girth with sterile nail nippers and dremel without iatrogenic bleeding.  -Corn(s) bilateral 2nd toes and callus(es) bilateral great toes were pared utilizing sterile scalpel blade without incident. Total number debrided =4. -Patient/POA to call should there be question/concern in the interim.   Return in about 3 months (around 09/30/2022).  [name], DPM

## 2022-07-12 ENCOUNTER — Encounter: Payer: Self-pay | Admitting: *Deleted

## 2022-07-12 DIAGNOSIS — Z139 Encounter for screening, unspecified: Secondary | ICD-10-CM

## 2022-07-12 LAB — GLUCOSE, POCT (MANUAL RESULT ENTRY): POC Glucose: 225 mg/dl — AB (ref 70–99)

## 2022-07-12 NOTE — Congregational Nurse Program (Signed)
  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 Stopped in 2010   Tobacco use disorder 10/07/2014    Encounter Details:  CNP Questionnaire - 07/12/22 1343       Questionnaire   Do you give verbal consent to treat you today? Yes    Location Patient Served  [facility]    Visit Setting Church or Organization    Patient Status Homeless   staying in [place] motel provided by [facility]    Insurance Referral [name]    Medication [name]    Medical Provider Yes    Screening Referrals [name]    Medical Referral [name]    Medical Appointment Made [name]    Food [name]    Transportation [name]    Housing/Utilities No permanent housing   currently staying at [facility]   Interpersonal Safety [name]    Intervention Support;Blood pressure;Blood glucose;Educate    ED Visit Averted [name]    Life-Saving Intervention Made [name]            Client came to nurse's office for a blood pressure and blood sugar check. Blood pressure 135/84, pulse 74. CBG 225. Offered support and education. Client reports he did not go pick up his metformin as indicated on prior visit. Client reports he will go to drug store today. Mong Neal W RN CN

## 2022-07-19 ENCOUNTER — Encounter: Payer: Self-pay | Admitting: *Deleted
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
## 2022-07-21 ENCOUNTER — Encounter: Payer: Self-pay | Admitting: *Deleted

## 2022-07-21 DIAGNOSIS — Z139 Encounter for screening, unspecified: Secondary | ICD-10-CM

## 2022-07-21 LAB — GLUCOSE, POCT (MANUAL RESULT ENTRY): POC Glucose: 302 mg/dl — AB (ref 70–99)

## 2022-07-21 NOTE — Congregational Nurse Program (Signed)
  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 Stopped in 2010   Tobacco use disorder 10/07/2014    Encounter Details:  CNP Questionnaire - 07/21/22 1320       Questionnaire   Do you give verbal consent to treat you today? Yes    Location Patient Served  [facility]    Visit Setting Church or Organization    Patient Status Homeless   staying in [place] motel provided by [facility]    Insurance Referral [name]    Medication [name]    Medical Provider Yes    Screening Referrals [name]    Medical Referral [name]    Medical Appointment Made [name]    Food [name]    Transportation [name]    Housing/Utilities No permanent housing   currently staying at [facility]   Interpersonal Safety [name]    Intervention Support;Blood pressure;Blood glucose;Educate    ED Visit Averted [name]    Life-Saving Intervention Made [name]            Client came to nurse's office for blood pressure and blood sugar check. Blood pressure (!) 148/89, pulse 86. CBG 281. Client had recently eaten a biscuit and jelly. Discussed better food choices for diabetes. Offered support and encouragement.  Kamorah Nevils W RN CN

## 2022-07-26 ENCOUNTER — Encounter: Payer: Self-pay | Admitting: *Deleted

## 2022-07-26 DIAGNOSIS — Z139 Encounter for screening, unspecified: Secondary | ICD-10-CM

## 2022-07-26 LAB — GLUCOSE, POCT (MANUAL RESULT ENTRY): POC Glucose: 175 mg/dl — AB (ref 70–99)

## 2022-07-26 NOTE — Congregational Nurse Program (Signed)
  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 Stopped in 2010   Tobacco use disorder 10/07/2014    Encounter Details:  CNP Questionnaire - 07/26/22 0923       Questionnaire   Do you give verbal consent to treat you today? Yes    Location Patient Served  [facility]    Visit Setting Church or [address]Phone/Text/Email    Patient Status Homeless   staying in [place] motel provided by [facility]    Insurance Referral [name]    Medication [name]    Medical Provider Yes    Screening Referrals [name]    Medical Referral [name]    Medical Appointment Made [name]    Food [name]    Transportation Need transportation assistance;Provided transportation assistance    Housing/Utilities No permanent housing   currently staying at [facility]   Interpersonal Safety [name]    Intervention Support;Blood pressure;Blood glucose    ED Visit Averted [name]    Life-Saving Intervention Made [name]            Client came in to nurse's office for a blood pressure and blood sugar check. Blood pressure (!) 144/96, pulse 83. CBG 175. Offered support and encouragement. Called [employer] and set up transportation for [facility] Sept 25 11:00 appt. Client will be picked up at [facility] 9:30. Addilynne Olheiser W RN CN

## 2022-08-09 ENCOUNTER — Encounter: Payer: Self-pay | Admitting: *Deleted

## 2022-08-09 DIAGNOSIS — Z139 Encounter for screening, unspecified: Secondary | ICD-10-CM

## 2022-08-09 LAB — GLUCOSE, POCT (MANUAL RESULT ENTRY): POC Glucose: 253 mg/dL — AB (ref 70–99)

## 2022-08-09 NOTE — Congregational Nurse Program (Signed)
  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 Stopped in 2010   Tobacco use disorder 10/07/2014    Encounter Details:  CNP Questionnaire - 08/09/22 1201       Questionnaire   Do you give verbal consent to treat you today? Yes    Location Patient Served  [facility]    Visit Setting Church or [address]Phone/Text/Email    Patient Status Homeless   staying in [place] motel provided by [facility]    Insurance Referral [name]    Medication [name]    Medical Provider Yes    Screening Referrals [name]    Medical Referral [name]    Medical Appointment Made [name]    Food [name]    Transportation [name]    Housing/Utilities No permanent housing   currently staying at [facility]   Interpersonal Safety [name]    Intervention Support;Blood pressure;Blood glucose    ED Visit Averted [name]    Life-Saving Intervention Made [name]            Client seen at [facility] and had a blood pressure and blood sugar check. Blood pressure (!) 161/101, pulse 90. CBG 253. Reinforced the need for taking blood pressure and blood sugar medication as prescribed. Provided food education for diabetes. Yessika Otte W RN CN

## 2022-08-23 ENCOUNTER — Other Ambulatory Visit (HOSPITAL_COMMUNITY)
Admission: EM | Admit: 2022-08-23 | Discharge: 2022-08-31 | Disposition: A | Discharge status: Rehabilitation Facility | Payer: Medicaid Other | Attending: Psychiatry | Admitting: Psychiatry

## 2022-08-23 DIAGNOSIS — E1142 Type 2 diabetes mellitus with diabetic polyneuropathy: Secondary | ICD-10-CM | POA: Insufficient documentation

## 2022-08-23 DIAGNOSIS — Z59819 Housing instability, housed unspecified: Secondary | ICD-10-CM | POA: Diagnosis present

## 2022-08-23 DIAGNOSIS — Z20822 Contact with and (suspected) exposure to covid-19: Secondary | ICD-10-CM | POA: Insufficient documentation

## 2022-08-23 DIAGNOSIS — I10 Essential (primary) hypertension: Secondary | ICD-10-CM | POA: Diagnosis present

## 2022-08-23 DIAGNOSIS — Z87891 Personal history of nicotine dependence: Secondary | ICD-10-CM

## 2022-08-23 DIAGNOSIS — E1136 Type 2 diabetes mellitus with diabetic cataract: Secondary | ICD-10-CM | POA: Diagnosis present

## 2022-08-23 DIAGNOSIS — F1020 Alcohol dependence, uncomplicated: Secondary | ICD-10-CM | POA: Diagnosis present

## 2022-08-23 DIAGNOSIS — F1910 Other psychoactive substance abuse, uncomplicated: Secondary | ICD-10-CM | POA: Diagnosis not present

## 2022-08-23 DIAGNOSIS — H409 Unspecified glaucoma: Secondary | ICD-10-CM | POA: Diagnosis not present

## 2022-08-23 DIAGNOSIS — G6289 Other specified polyneuropathies: Secondary | ICD-10-CM

## 2022-08-23 DIAGNOSIS — Z79899 Other long term (current) drug therapy: Secondary | ICD-10-CM | POA: Insufficient documentation

## 2022-08-23 DIAGNOSIS — F1420 Cocaine dependence, uncomplicated: Secondary | ICD-10-CM | POA: Insufficient documentation

## 2022-08-23 DIAGNOSIS — F1211 Cannabis abuse, in remission: Secondary | ICD-10-CM | POA: Insufficient documentation

## 2022-08-23 DIAGNOSIS — Z5902 Unsheltered homelessness: Secondary | ICD-10-CM | POA: Insufficient documentation

## 2022-08-23 DIAGNOSIS — Z8673 Personal history of transient ischemic attack (TIA), and cerebral infarction without residual deficits: Secondary | ICD-10-CM | POA: Insufficient documentation

## 2022-08-23 DIAGNOSIS — E785 Hyperlipidemia, unspecified: Secondary | ICD-10-CM | POA: Diagnosis present

## 2022-08-23 DIAGNOSIS — Z7984 Long term (current) use of oral hypoglycemic drugs: Secondary | ICD-10-CM | POA: Insufficient documentation

## 2022-08-23 DIAGNOSIS — F1290 Cannabis use, unspecified, uncomplicated: Secondary | ICD-10-CM | POA: Diagnosis present

## 2022-08-23 HISTORY — DX: Cocaine dependence, uncomplicated: F14.20

## 2022-08-23 LAB — LIPID PANEL
Cholesterol: 220 mg/dL — ABNORMAL HIGH (ref 0–200)
HDL: 61 mg/dL (ref >40)
LDL Cholesterol: 137 mg/dL — ABNORMAL HIGH (ref 0–99)
Total CHOL/HDL Ratio: 3.6 RATIO
Triglycerides: 112 mg/dL (ref <150)
VLDL: 22 mg/dL (ref 0–40)

## 2022-08-23 LAB — COMPREHENSIVE METABOLIC PANEL
ALT: 22 U/L (ref 0–44)
AST: 24 U/L (ref 15–41)
Albumin: 3.9 g/dL (ref 3.5–5.0)
Alkaline Phosphatase: 57 U/L (ref 38–126)
Anion gap: 7 (ref 5–15)
BUN: 16 mg/dL (ref 6–20)
CO2: 31 mmol/L (ref 22–32)
Calcium: 9.5 mg/dL (ref 8.9–10.3)
Chloride: 101 mmol/L (ref 98–111)
Creatinine, Ser: 0.95 mg/dL (ref 0.61–1.24)
GFR, Estimated: >60 mL/min (ref >60)
Glucose, Bld: 204 mg/dL — ABNORMAL HIGH (ref 70–99)
Potassium: 3.5 mmol/L (ref 3.5–5.1)
Sodium: 139 mmol/L (ref 135–145)
Total Bilirubin: 0.6 mg/dL (ref 0.3–1.2)
Total Protein: 6.9 g/dL (ref 6.5–8.1)

## 2022-08-23 LAB — POCT URINE DRUG SCREEN - MANUAL ENTRY (I-SCREEN)
POC Amphetamine UR: NOT DETECTED
POC Buprenorphine (BUP): NOT DETECTED
POC Cocaine UR: POSITIVE — AB
POC Marijuana UR: POSITIVE — AB
POC Methadone UR: NOT DETECTED
POC Methamphetamine UR: NOT DETECTED
POC Morphine: NOT DETECTED
POC Oxazepam (BZO): NOT DETECTED
POC Oxycodone UR: NOT DETECTED
POC Secobarbital (BAR): NOT DETECTED

## 2022-08-23 LAB — CBC WITH DIFFERENTIAL/PLATELET
Abs Immature Granulocytes: 0.02 10*3/uL (ref 0.00–0.07)
Basophils Absolute: 0 10*3/uL (ref 0.0–0.1)
Basophils Relative: 1 %
Eosinophils Absolute: 0.1 10*3/uL (ref 0.0–0.5)
Eosinophils Relative: 2 %
HCT: 37.9 % — ABNORMAL LOW (ref 39.0–52.0)
Hemoglobin: 13.7 g/dL (ref 13.0–17.0)
Immature Granulocytes: 0 %
Lymphocytes Relative: 33 %
Lymphs Abs: 1.9 10*3/uL (ref 0.7–4.0)
MCH: 32.9 pg (ref 26.0–34.0)
MCHC: 36.1 g/dL — ABNORMAL HIGH (ref 30.0–36.0)
MCV: 91.1 fL (ref 80.0–100.0)
Monocytes Absolute: 0.3 10*3/uL (ref 0.1–1.0)
Monocytes Relative: 6 %
Neutro Abs: 3.5 10*3/uL (ref 1.7–7.7)
Neutrophils Relative %: 58 %
Platelets: UNDETERMINED 10*3/uL (ref 150–400)
RBC: 4.16 MIL/uL — ABNORMAL LOW (ref 4.22–5.81)
RDW: 14 % (ref 11.5–15.5)
WBC: 5.9 10*3/uL (ref 4.0–10.5)
nRBC: 0 % (ref 0.0–0.2)

## 2022-08-23 LAB — RESP PANEL BY RT-PCR (FLU A&B, COVID) ARPGX2
Influenza A by PCR: NEGATIVE
Influenza B by PCR: NEGATIVE
SARS Coronavirus 2 by RT PCR: NEGATIVE

## 2022-08-23 LAB — TSH: TSH: 1.406 u[IU]/mL (ref 0.350–4.500)

## 2022-08-23 LAB — POC SARS CORONAVIRUS 2 AG: SARSCOV2ONAVIRUS 2 AG: NEGATIVE

## 2022-08-23 LAB — ETHANOL: Alcohol, Ethyl (B): <10 mg/dL (ref <10)

## 2022-08-23 MED ORDER — METFORMIN HCL 500 MG PO TABS
500.0000 mg | ORAL_TABLET | Freq: Two times a day (BID) | ORAL | Status: DC
  Administered 2022-08-23 – 2022-08-29 (×12): 500 mg via ORAL
  Filled 2022-08-23: qty 28
  Filled 2022-08-23 (×9): qty 1
  Filled 2022-08-23 (×3): qty 28
  Filled 2022-08-23 (×3): qty 1

## 2022-08-23 MED ORDER — MAGNESIUM HYDROXIDE 400 MG/5ML PO SUSP: 30.0000 mL | Freq: Every day | ORAL | Status: DC | PRN

## 2022-08-23 MED ORDER — NETARSUDIL-LATANOPROST 0.02-0.005 % OP SOLN
1.0000 [drp] | Freq: Every day | OPHTHALMIC | Status: DC
  Administered 2022-08-24 – 2022-08-29 (×6): 1 [drp] via OPHTHALMIC
  Filled 2022-08-23: qty 2.5

## 2022-08-23 MED ORDER — ALUM & MAG HYDROXIDE-SIMETH 200-200-20 MG/5ML PO SUSP: 30.0000 mL | ORAL | Status: DC | PRN

## 2022-08-23 MED ORDER — FLUOROMETHOLONE 0.1 % OP SUSP
1.0000 [drp] | Freq: Every day | OPHTHALMIC | Status: DC
  Administered 2022-08-23 – 2022-08-30 (×8): 1 [drp] via OPHTHALMIC
  Filled 2022-08-23: qty 5

## 2022-08-23 MED ORDER — THIAMINE MONONITRATE 100 MG PO TABS
100.0000 mg | ORAL_TABLET | Freq: Every day | ORAL | Status: DC
  Administered 2022-08-24 – 2022-08-30 (×7): 100 mg via ORAL
  Filled 2022-08-23 (×7): qty 1

## 2022-08-23 MED ORDER — ADULT MULTIVITAMIN W/MINERALS CH
1.0000 | ORAL_TABLET | Freq: Every day | ORAL | Status: DC
  Administered 2022-08-23 – 2022-08-30 (×8): 1 via ORAL
  Filled 2022-08-23 (×8): qty 1

## 2022-08-23 MED ORDER — FLUOROMETHOLONE 0.1 % OP SUSP
1.0000 [drp] | Freq: Every day | OPHTHALMIC | Status: DC
  Filled 2022-08-23: qty 5

## 2022-08-23 MED ORDER — ACETAMINOPHEN 325 MG PO TABS: 650.0000 mg | ORAL_TABLET | Freq: Four times a day (QID) | ORAL | Status: DC | PRN

## 2022-08-23 MED ORDER — ASPIRIN 81 MG PO TBEC
81.0000 mg | DELAYED_RELEASE_TABLET | Freq: Once | ORAL | Status: AC
  Administered 2022-08-23: 81 mg via ORAL
  Filled 2022-08-23: qty 1

## 2022-08-23 MED ORDER — LISINOPRIL 20 MG PO TABS
40.0000 mg | ORAL_TABLET | Freq: Every day | ORAL | Status: DC
  Administered 2022-08-23 – 2022-08-25 (×3): 40 mg via ORAL
  Filled 2022-08-23 (×3): qty 2

## 2022-08-23 MED ORDER — GABAPENTIN 300 MG PO CAPS
300.0000 mg | ORAL_CAPSULE | Freq: Three times a day (TID) | ORAL | Status: DC
  Administered 2022-08-23 – 2022-08-30 (×23): 300 mg via ORAL
  Filled 2022-08-23 (×2): qty 42
  Filled 2022-08-23 (×2): qty 1
  Filled 2022-08-23: qty 42
  Filled 2022-08-23 (×9): qty 1
  Filled 2022-08-23: qty 42
  Filled 2022-08-23 (×3): qty 1
  Filled 2022-08-23: qty 42
  Filled 2022-08-23: qty 1
  Filled 2022-08-23: qty 42
  Filled 2022-08-23 (×5): qty 1
  Filled 2022-08-23: qty 42
  Filled 2022-08-23 (×2): qty 1
  Filled 2022-08-23 (×2): qty 42
  Filled 2022-08-23: qty 1

## 2022-08-23 MED ORDER — BRIMONIDINE TARTRATE 0.15 % OP SOLN
1.0000 [drp] | Freq: Two times a day (BID) | OPHTHALMIC | Status: DC
  Administered 2022-08-23 – 2022-08-30 (×16): 1 [drp] via OPHTHALMIC
  Filled 2022-08-23: qty 5

## 2022-08-23 MED ORDER — HYDROCHLOROTHIAZIDE 25 MG PO TABS
25.0000 mg | ORAL_TABLET | Freq: Every day | ORAL | Status: DC
  Administered 2022-08-23 – 2022-08-25 (×3): 25 mg via ORAL
  Filled 2022-08-23 (×3): qty 1

## 2022-08-23 MED ORDER — ATORVASTATIN CALCIUM 40 MG PO TABS
80.0000 mg | ORAL_TABLET | Freq: Every day | ORAL | Status: DC
  Administered 2022-08-23 – 2022-08-30 (×8): 80 mg via ORAL
  Filled 2022-08-23: qty 2
  Filled 2022-08-23: qty 28
  Filled 2022-08-23 (×6): qty 2
  Filled 2022-08-23: qty 28
  Filled 2022-08-23: qty 2
  Filled 2022-08-23: qty 28

## 2022-08-23 MED ORDER — THIAMINE HCL 100 MG/ML IJ SOLN
100.0000 mg | Freq: Once | INTRAMUSCULAR | Status: AC
  Administered 2022-08-23: 100 mg via INTRAMUSCULAR
  Filled 2022-08-23: qty 2

## 2022-08-23 MED ORDER — HYDROXYZINE HCL 25 MG PO TABS: 25.0000 mg | ORAL_TABLET | Freq: Four times a day (QID) | ORAL | Status: AC | PRN

## 2022-08-23 MED ORDER — DORZOLAMIDE HCL-TIMOLOL MAL 2-0.5 % OP SOLN
1.0000 [drp] | Freq: Two times a day (BID) | OPHTHALMIC | Status: DC
  Administered 2022-08-23 – 2022-08-30 (×16): 1 [drp] via OPHTHALMIC
  Filled 2022-08-23: qty 10

## 2022-08-23 MED ORDER — LORAZEPAM 1 MG PO TABS: 1.0000 mg | ORAL_TABLET | Freq: Four times a day (QID) | ORAL | Status: AC | PRN

## 2022-08-23 MED ORDER — LOPERAMIDE HCL 2 MG PO CAPS: 2.0000 mg | ORAL_CAPSULE | ORAL | Status: AC | PRN

## 2022-08-23 NOTE — BH Assessment (Signed)
Comprehensive Clinical Assessment (CCA) Note  08/23/2022 [place] [id number]   Disposition: Per Lauree Chandler, NP admission to [facility] is recommended.    The patient demonstrates the following risk factors for suicide: Chronic risk factors for suicide include: substance use disorder. Acute risk factors for suicide include: social withdrawal/isolation and loss (financial, interpersonal, professional). Protective factors for this patient include: positive social support and hope for the future. Considering these factors, the overall suicide risk at this point appears to be low. Patient is appropriate for outpatient follow up.  Patient is a 61 year old male with a history of Alcohol Use Disorder, severe and Cocaine Use Disorder, moderate who presents voluntarily to [facility] for assessment.  Patient presents reporting he is seeking treatment for alcohol abuse.  He reports his close friend/support, that he calls his brother Clide Cliff, dropped him off here. Patient reports daily ETOH use for the past 11-12 years.  He drinks 1-2 40 oz beers daily and uses cocaine several times per week.  Patient has no past SA treatment history . He is currently homeless after losing his apartment 4 yrs ago.  He states there were issues with some of the roommates at the time.  he has since been "on the streets and sleeping on sidewalks."  He showers at [facility] and is involved with medical and case management services there.  Patient denies psychiatric history and he is not engaged in outpatient mental health treatment.  No past inpatient psychiatric or SA treatment admissions.  Patient denies SI, HI or AVH.  He is seeking detox treatment and would like to then be referred to long term rehab treatment.  Chief Complaint: No chief complaint on file.  Visit Diagnosis: Alcohol Use Disorder, severe                             Cocaine Use Disorder, moderate   Flowsheet Row ED from 08/23/2022 in [facility] Office Visit from 05/12/2020 in [facility]  Thoughts that you would be better off dead, or of hurting yourself in some way Not at all Not at all  PHQ-9 Total Score 6 0      Flowsheet Row ED from 08/23/2022 in [facility] ED from 10/10/2021 in [facility] EMERGENCY DEPARTMENT ED from 06/27/2021 in [facility] EM[facility]GENCY DEPARTMENT  C-SSRS RISK CATEGORY No Risk No Risk No Risk       CCA Screening, Triage and Referral (STR)  Patient Reported Information How did you hear about [place]? Family/Friend  What Is the Reason for Your Visit/Call Today? Patient presents voluntarily reporting he is seeking treatment for alcohol abuse.  Patient reports his close friend/support, that he calls his brother Clide Cliff, dropped him off here. Patient reports daily ETOH use for the past 11-12 years.  He drinks 1-2 40 oz beers daily and uses cocaine several times per week.  Patient has no past SA treatment history . He is currently homeless after losing his apartment 4 yrs ago.  He states there were issues with some of the roommates at the time.  he has since been "on the streets and sleeping on sidewalks."  He showers at [facility] and is involved with medical and case management services there.  Patient denies SI, HI or AVH.  He is seeking detox treatment and would like to then be referred to long term rehab treatment.  How Long Has This Been Causing You Problems? > than 6 months  What Do You Feel Would Help You the Most Today? Alcohol or Drug Use Treatment   Have You Recently Had Any Thoughts About Hurting Yourself? No  Are You Planning to Commit Suicide/Harm Yourself At This time? No   Have you Recently Had Thoughts About [place]? No  Are You Planning to Harm Someone at This Time? No  Explanation: No data recorded  Have You Used Any Alcohol or Drugs in the Past 24 Hours? Yes  How Long Ago Did  You Use Drugs or Alcohol? No data recorded What Did You Use and How Much? ETOH - 2 -40s and cocaine (amt unk)   Do You Currently Have a Therapist/Psychiatrist? No data recorded Name of Therapist/Psychiatrist: No data recorded  Have You Been Recently Discharged From Any Office Practice or Programs? No data recorded Explanation of Discharge From Practice/Program: No data recorded    CCA Screening Triage Referral Assessment Type of Contact: No data recorded Telemedicine Service Delivery:   Is this Initial or Reassessment? No data recorded Date Telepsych consult ordered in [facility]:  No data recorded Time Telepsych consult ordered in [facility]:  No data recorded Location of Assessment: No data recorded Provider Location: No data recorded  Collateral Involvement: No data recorded  Does Patient Have a [place]? No data recorded Legal Guardian Contact Information: No data recorded Copy of Legal Guardianship Form: No data recorded Legal Guardian Notified of Arrival: No data recorded Legal Guardian Notified of Pending Discharge: No data recorded If Minor and Not Living with Parent(s), Who has Custody? No data recorded Is CPS involved or ever been involved? No data recorded Is APS involved or ever been involved? No data recorded  Patient Determined To Be At Risk for Harm To Self or Others Based on Review of Patient Reported Information or Presenting Complaint? No data recorded Method: No data recorded Availability of Means: No data recorded Intent: No data recorded Notification Required: No data recorded Additional Information for Danger to Others Potential: No data recorded Additional Comments for Danger to Others Potential: No data recorded Are There Guns or Other Weapons in Your Home? No data recorded Types of Guns/Weapons: No data recorded Are These Weapons Safely Secured?                            No data recorded Who Could Verify You Are Able To Have These Secured: No  data recorded Do You Have any Outstanding Charges, Pending Court Dates, Parole/Probation? No data recorded Contacted To Inform of Risk of Harm To Self or Others: No data recorded   Does Patient Present under Involuntary Commitment? No data recorded IVC Papers Initial File Date: No data recorded  [place] of Residence: No data recorded  Patient Currently Receiving the Following Services: No data recorded  Determination of Need: Urgent (48 hours)   Options For Referral: Facility-Based Crisis     CCA Biopsychosocial Patient Reported Schizophrenia/Schizoaffective Diagnosis in Past: No   Strengths: Seeking treatment   Mental Health Symptoms Depression:   Difficulty Concentrating   Duration of Depressive symptoms:  Duration of Depressive Symptoms: Greater than two weeks   Mania:   None   Anxiety:    Worrying; Tension   Psychosis:  No data recorded  Duration of Psychotic symptoms:    Trauma:   None   Obsessions:   None   Compulsions:  None   Inattention:   [name]   Hyperactivity/Impulsivity:   [name]   Oppositional/Defiant Behaviors:   [name]   Emotional Irregularity:   None   Other Mood/Personality Symptoms:  No data recorded   Mental Status Exam Appearance and self-care  Stature:   Average   Weight:   Average weight   Clothing:   Casual   Grooming:   Normal   Cosmetic use:   None   Posture/gait:   Normal   Motor activity:   Not Remarkable   Sensorium  Attention:   Normal   Concentration:   Normal   Orientation:   X5   Recall/memory:   Normal   Affect and Mood  Affect:   Appropriate   Mood:   Depressed   Relating  Eye contact:   Normal (struggles due to being legally blind, states he follows voices)   Facial expression:   Responsive   Attitude toward examiner:   Cooperative   Thought and Language  Speech flow:  Articulation error (d/t hx of stroke)   Thought content:   Appropriate to Mood and Circumstances    Preoccupation:   None   Hallucinations:   None   Organization:  No data recorded  [employer] of Knowledge:   Average   Intelligence:   Average   Abstraction:   Functional   Judgement:   Fair   Reality Testing:   Adequate   Insight:   Flashes of insight   Decision Making:   Normal   Social Functioning  Social Maturity:   Isolates   Social Judgement:   Normal   Stress  Stressors:   Surveyor, quantity; Housing   Coping Ability:   Overwhelmed   Skill Deficits:   Communication; Decision making; Interpersonal; Responsibility; Self-control; Self-care   Supports:   Support needed     Religion: Religion/Spirituality Are You A Religious Person?: Yes What is Your Religious Affiliation?:  (NA) How Might This Affect Treatment?: NA  Leisure/Recreation: Leisure / Recreation Do You Have Hobbies?: No  Exercise/Diet: Exercise/Diet Do You Exercise?: Yes What Type of Exercise Do You Do?: Run/Walk How Many Times a Week Do You Exercise?: 6-7 times a week Have You Gained or Lost A Significant Amount of Weight in the Past Six Months?: No Do You Follow a Special Diet?: No Do You Have Any Trouble Sleeping?: No   CCA Employment/Education Employment/Work Situation: Employment / Work Situation Employment Situation: On disability Why is Patient on Disability: stroke, legally blind How Long has Patient Been on Disability: NA Has Patient ever Been in the [employer]?: No  Education: Education Is Patient Currently Attending School?: No Last Grade Completed: 12 Did You Product manager?: No Did You Have An Individualized Education Program (IIEP): No Did You Have Any Difficulty At School?: No Patient's Education Has Been Impacted by Current Illness: No   CCA Family/Childhood History Family and Relationship History: Family history Marital status: Single Does patient have children?: No  Childhood History:  Childhood History By whom was/is the patient  raised?: Mother, Father Did patient suffer any verbal/emotional/physical/sexual abuse as a child?: No Did patient suffer from severe childhood neglect?: No Has patient ever been sexually abused/assaulted/raped as an adolescent or adult?: No Was the patient ever a victim of a crime or a disaster?: No Witnessed domestic violence?: No Has patient been affected by domestic violence as an adult?: No  Child/Adolescent Assessment:     CCA Substance Use Alcohol/Drug Use: Alcohol / Drug Use Pain Medications: See [facility]  Prescriptions: See MAR Over the Counter: See MAR History of alcohol / drug use?: Yes Longest period of sobriety (when/how long): denies sobriety periods Negative Consequences of Use: Financial, Personal relationships Withdrawal Symptoms: None Substance #1 Name of Substance 1: ETOH 1 - Age of First Use: 20s 1 - Amount (size/oz): 2-40 oz beers 1 - Frequency: daily 1 - Duration: "years" 1 - Last Use / Amount: last night- 2-40s 1 - Method of Aquiring: NA 1- Route of Use: drinks Substance #2 Name of Substance 2: Cocaine 2 - Age of First Use: 20s 2 - Amount (size/oz): unknown 2 - Frequency: 3-4 x per week or "when I can get it" 2 - Duration: "years" 2 - Last Use / Amount: last night 2 - Method of Aquiring: NA 2 - Route of Substance Use: smokes                     ASAM's:  Six Dimensions of Multidimensional Assessment  Dimension 1:  Acute Intoxication and/or Withdrawal Potential:   Dimension 1:  Description of individual's past and current experiences of substance use and withdrawal: No signs of current withdrawal - denies hx of withdrawal sx, no sober periods  Dimension 2:  Biomedical Conditions and Complications:   Dimension 2:  Description of patient's biomedical conditions and  complications: nerve pain following stroke 5 yrs ago - manages with gabapentin  Dimension 3:  Emotional, Behavioral, or Cognitive Conditions and Complications:  Dimension 3:   Description of emotional, behavioral, or cognitive conditions and complications: reports some underlyng untreated depressive symptoms  Dimension 4:  Readiness to Change:  Dimension 4:  Description of Readiness to Change criteria: no hx of seeking treatment for SA issues  Dimension 5:  Relapse, Continued use, or Continued Problem Potential:  Dimension 5:  Relapse, continued use, or continued problem potential critiera description: ltd understanding of MI and SA issues  Dimension 6:  Recovery/Living Environment:  Dimension 6:  Recovery/Iiving environment criteria description: No supports, uses with individuals on the street  ASAM Severity Score: ASAM's Severity Rating Score: 11  ASAM Recommended Level of Treatment: ASAM Recommended Level of Treatment: Level II Intensive Outpatient Treatment   Substance use Disorder (SUD) Substance Use Disorder (SUD)  Checklist Symptoms of Substance Use: Continued use despite persistent or recurrent social, interpersonal problems, caused or exacerbated by use, Persistent desire or unsuccessful efforts to cut down or control use, Social, occupational, recreational activities given up or reduced due to use  Recommendations for Services/Supports/Treatments: Recommendations for Services/Supports/Treatments Recommendations For Services/Supports/Treatments: Facility Based Crisis  Discharge Disposition:    DSM5 Diagnoses: Patient Active Problem List   Diagnosis Date Noted   Polysubstance abuse (HCC) 08/23/2022   Neuropathic pain of hand, right 01/11/2022   Syncope 10/12/2021   Bilateral foot pain 07/08/2021   Shingles 10/05/2020   Vitamin B12 deficiency 08/06/2019   Erectile dysfunction 05/06/2018   Post-nasal drip 10/01/2017   Peripheral neuropathy 08/06/2017   Adjustment disorder with mixed anxiety and depressed mood    CVA (cerebral vascular accident) (HCC) 02/19/2017   Intracranial vascular stenosis 02/16/2017   History of substance abuse (HCC) 02/16/2017    Prolonged QT interval 02/16/2017   Chronic combined systolic and diastolic congestive heart failure (HCC) 05/12/2015   Stable angina (HCC) 01/07/2015   Alcohol abuse 11/06/2014   History of tobacco use 10/07/2014   Healthcare maintenance 01/23/2014   Glaucoma 05/11/2012   Hyperlipidemia LDL goal <100 03/22/2012   Hypertension 01/17/2012   Diabetes mellitus with diabetic  cataract (HCC) 03/08/2009     Referrals to Alternative Service(s): Referred to Alternative Service(s):   Place:   Date:   Time:    Referred to Alternative Service(s):   Place:   Date:   Time:    Referred to Alternative Service(s):   Place:   Date:   Time:    Referred to Alternative Service(s):   Place:   Date:   Time:     Yetta Glassman, [facility]
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
## 2022-08-23 NOTE — ED Notes (Signed)
Patient resting well with no sxs of distress noted - will continue to monitor 

## 2022-08-23 NOTE — ED Notes (Signed)
Pt. In dayroom, watching TV. Denies SI/HI. Will continue to monitor for safety.

## 2022-08-24 ENCOUNTER — Encounter (HOSPITAL_COMMUNITY): Payer: Self-pay

## 2022-08-24 ENCOUNTER — Encounter (HOSPITAL_COMMUNITY): Payer: Self-pay | Admitting: Psychiatry

## 2022-08-24 DIAGNOSIS — I10 Essential (primary) hypertension: Secondary | ICD-10-CM | POA: Diagnosis not present

## 2022-08-24 DIAGNOSIS — F1910 Other psychoactive substance abuse, uncomplicated: Secondary | ICD-10-CM | POA: Diagnosis not present

## 2022-08-24 DIAGNOSIS — F1290 Cannabis use, unspecified, uncomplicated: Secondary | ICD-10-CM | POA: Diagnosis present

## 2022-08-24 DIAGNOSIS — H409 Unspecified glaucoma: Secondary | ICD-10-CM | POA: Diagnosis not present

## 2022-08-24 DIAGNOSIS — Z20822 Contact with and (suspected) exposure to covid-19: Secondary | ICD-10-CM | POA: Diagnosis not present

## 2022-08-24 DIAGNOSIS — Z59819 Housing instability, housed unspecified: Secondary | ICD-10-CM

## 2022-08-24 HISTORY — DX: Cannabis use, unspecified, uncomplicated: F12.90

## 2022-08-24 HISTORY — DX: Housing instability, housed unspecified: Z59.819

## 2022-08-24 MED ORDER — SERTRALINE HCL 50 MG PO TABS
50.0000 mg | ORAL_TABLET | Freq: Every day | ORAL | Status: DC
  Administered 2022-08-25 – 2022-08-30 (×6): 50 mg via ORAL
  Filled 2022-08-24 (×3): qty 14
  Filled 2022-08-24 (×6): qty 1

## 2022-08-24 MED ORDER — NALTREXONE HCL 50 MG PO TABS
50.0000 mg | ORAL_TABLET | Freq: Every day | ORAL | Status: DC
  Administered 2022-08-24 – 2022-08-30 (×6): 50 mg via ORAL
  Filled 2022-08-24: qty 1
  Filled 2022-08-24: qty 14
  Filled 2022-08-24 (×4): qty 1
  Filled 2022-08-24: qty 14
  Filled 2022-08-24: qty 1
  Filled 2022-08-24: qty 14

## 2022-08-24 MED ORDER — SERTRALINE HCL 25 MG PO TABS
25.0000 mg | ORAL_TABLET | Freq: Every day | ORAL | Status: DC
  Administered 2022-08-24: 25 mg via ORAL
  Filled 2022-08-24: qty 1

## 2022-08-24 NOTE — ED Notes (Signed)
Patient denies SI/HI and AVH. Patient is up this morning getting ready for breakfast. Patient has no problems indicated. Patient is being monitored for safety.

## 2022-08-24 NOTE — ED Provider Notes (Signed)
[facility] Based Crisis Behavioral Health Progress Note  Date & Time: 08/24/2022 8:22 PM Name: Brady Church Age: 61 y.o.  DOB: February 25, 1961  MRN: [id number]  Diagnosis:  Final diagnoses:  Polysubstance abuse [facility])    Reason for presentation: No chief complaint on file.  Brief HPI  Brady Church is a 61 y.o. 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  UDS + cocaine, marijuana. BAL neg.  Interval Hx   Patient Narrative:   Patient was initially seen this AM, sitting in the day room, comfortable, no acute distress. Patient stated that he was amenable to residential rehab, however was wary of [facility] due to having to work.  He perceived that they would take his money, and that they overworked people.  Explained to patient that places like [facility] does initially take patient income, however he is paying for housing, food, treatment.  After a few months, patient will start receiving the phone and come back, then wanted to graduate, patient would receive all of his income again.  Patient stated he would rather go to sober living [place], for treatment and housing.  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  He denied withdrawal symptoms of shakes and tremors.  Denied craving at this time.  Patient reported being highly motivated and was amenable to making calls to inquire about housing and treatment.  Patient had no other questions or concerns, he was amenable to plan per below.    UD:[id number] Thoughts: No ZC:HYIFOYDXA Thoughts: No JOI:[id number]: None  Mood: Dysphoric,  Depressed Sleep:Fair Appetite: Good Review of Systems  Respiratory:  Negative for shortness of breath.   Cardiovascular:  Negative for chest pain.  Gastrointestinal:  Negative for nausea and vomiting.  Neurological:  Negative for dizziness and headaches.     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 Age of First Use: 70s.  2-40 oz beers. Frequency: daily. Duration: "years". Tobacco:  reports that he quit smoking about 5 years ago. His smoking use included cigarettes. He smoked an average of .5 packs per day. He has never used smokeless tobacco.  Cannabis: yes Opiates: Denied Stimulants: cocaine. Age of First Use: 88s. Frequency: 3-4 x per week or "when I can get it". 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 Stopped in 2010   Tobacco use disorder 10/07/2014    Past  Surgical History:  Procedure Laterality Date   CATARACT EXTRACTION  01/08/2012   Right eye   EYE SURGERY     x 4 total    Family History:  Family History  Problem Relation Age of Onset   Coronary artery disease Mother 2       died of MI   Cancer Father 90       Some GI cancer. Not sure if it was Colon Cancer or not.   Hypertension Father    Diabetes Maternal Grandmother    Social History   Substance and Sexual Activity  Alcohol Use Yes   Comment: Beer- 40 oz. every day.    Social History   Substance and Sexual Activity  Drug Use Not Currently   Types: Cocaine, Marijuana   Comment: Sometimes.    Social History   Socioeconomic History   Marital status: Single    Spouse name: Not on file   Number of children: Not on file   Years of education: 12   Highest education level: Not on file  Occupational History   Not on file  Tobacco Use   Smoking status: Former    Packs/day: 0.50    Types: Cigarettes    Quit date: 02/18/2017    Years since quitting: 5.5   Smokeless tobacco: Never  Substance and Sexual Activity   Alcohol use: Yes    Comment: Beer- 40 oz. every day.   Drug use: Not Currently    Types: Cocaine, Marijuana    Comment: Sometimes.   Sexual activity: Not on file  Other Topics Concern   Not on file  Social History Narrative   Lives in [place] for last 10 years with "friend"  Brady Church      Is not married and does not have kids.   Working sporadically as a Horticulturist, commercial.    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   Social Determinants of Health   Financial Resource Strain: Not on file  Food Insecurity: Not on file  Transportation Needs: Not on file  Physical Activity: Not on file  Stress: Not on file  Social Connections: Not on file   SDOH: SDOH Screenings   Depression (PHQ2-9): Medium Risk (08/23/2022)  Tobacco Use: Medium Risk (08/24/2022)   Additional Social History: Alcohol / Drug Use Pain Medications: See  MAR Prescriptions: See MAR Over the Counter: See MAR History of alcohol / drug use?: Yes Longest period of sobriety (when/how long): denies sobriety periods Negative Consequences of Use: Financial, Personal relationships Withdrawal Symptoms: None Substance #1 Name of Substance 1: ETOH 1 - Age of First Use: 20s 1 - Amount (size/oz): 2-40 oz beers 1 - Frequency: daily 1 - Duration: "years" 1 - Last Use / Amount: last night- 2-40s 1 - Method of Aquiring: NA 1- Route of Use: drinks Substance #2 Name of Substance 2: Cocaine 2 - Age of First Use: 20s 2 - Amount (size/oz): unknown 2 - Frequency: 3-4 x per week or "when I can get it" 2 - Duration: "years" 2 - Last Use / Amount: last night 2 - Method of Aquiring: NA 2 - Route of Substance Use: smokes Current Medications  Current Facility-Administered Medications  Medication Dose Route Frequency Provider Last Rate Last Admin   acetaminophen (TYLENOL) tablet 650 mg  650 mg Oral Q6H PRN Tharon Aquas, NP       alum & mag hydroxide-simeth (MAALOX/MYLANTA) 200-200-20 MG/5ML suspension 30 mL  30 mL Oral Q4H PRN Tharon Aquas, NP       atorvastatin (LIPITOR) tablet 80 mg  80 mg Oral q1800 Tharon Aquas, NP   80 mg at 08/24/22 1710   brimonidine (ALPHAGAN) 0.15 % ophthalmic solution 1 drop  1 drop Both Eyes BID Tharon Aquas, NP   1 drop at 08/24/22 1158   dorzolamide-timolol (COSOPT) 22.3-6.8 MG/ML ophthalmic solution 1 drop  1 drop Right Eye BID Tharon Aquas, NP   1 drop at 08/24/22 0926   fluorometholone (FML) 0.1 % ophthalmic suspension 1 drop  1 drop Both Eyes Daily Hampton Abbot, MD   1 drop at 08/24/22 0925   gabapentin (NEURONTIN) capsule 300 mg  300 mg Oral TID Tharon Aquas, NP   300 mg at 08/24/22 1710   hydrochlorothiazide (HYDRODIURIL) tablet 25 mg  25 mg Oral Daily Tharon Aquas, NP   25 mg at 08/24/22 1610   hydrOXYzine (ATARAX) tablet 25 mg  25 mg Oral Q6H PRN Tharon Aquas, NP        lisinopril (ZESTRIL) tablet 40 mg  40 mg Oral Daily Tharon Aquas, NP   40 mg at 08/24/22 9604   loperamide (IMODIUM) capsule 2-4 mg  2-4 mg Oral PRN Tharon Aquas, NP       LORazepam (ATIVAN) tablet 1 mg  1 mg Oral Q6H PRN Tharon Aquas, NP       magnesium hydroxide (MILK OF MAGNESIA) suspension 30 mL  30 mL Oral Daily PRN Tharon Aquas, NP       metFORMIN (GLUCOPHAGE) tablet 500 mg  500 mg Oral BID WC Tharon Aquas, NP   500 mg at 08/24/22 1710   multivitamin with minerals tablet 1 tablet  1 tablet Oral Daily Tharon Aquas, NP   1 tablet at 08/24/22 5409   naltrexone (DEPADE) tablet 50 mg  50 mg Oral QHS Merrily Brittle, DO       Netarsudil-Latanoprost 0.02-0.005 % SOLN 1 drop  1 drop Right Eye QHS Tharon Aquas, NP       sertraline (ZOLOFT) tablet 25 mg  25 mg Oral Daily Merrily Brittle, DO   25 mg at 08/24/22 1154   thiamine (VITAMIN B1) tablet 100 mg  100 mg Oral Daily Tharon Aquas, NP   100 mg at 08/24/22 8119   Current Outpatient Medications  Medication Sig Dispense Refill   ALPHAGAN P 0.1 % SOLN INSTILL 1 DROP INTO EACH EYE TWICE DAILY (Patient taking differently: Place 1 drop into both eyes in the morning and at bedtime.) 5 mL 5   aspirin 325 MG tablet Take 325 mg by mouth daily.     atorvastatin (LIPITOR) 80 MG tablet TAKE 1 TABLET BY MOUTH ONCE DAILY AT  6  PM (Patient taking differently: Take 80 mg by mouth daily at 6 PM.) 90 tablet 3   dorzolamide-timolol (COSOPT) 22.3-6.8 MG/ML ophthalmic solution Place 1 drop into the right eye 2 (two) times daily. 10 mL 5   fluorometholone (FML) 0.1 % ophthalmic suspension INSTILL 1 DROP INTO EACH EYE ONCE DAILY (Patient taking differently: Place 1 drop into both eyes daily.) 5 mL 5   gabapentin (NEURONTIN)  300 MG capsule Take 1 capsule (300 mg total) by mouth 3 (three) times daily. 90 capsule 11   hydrochlorothiazide (HYDRODIURIL) 25 MG tablet Take 1 tablet (25 mg total) by mouth daily. 90 tablet 0    lisinopril (ZESTRIL) 40 MG tablet Take 1 tablet (40 mg total) by mouth daily. 30 tablet 5   metFORMIN (GLUCOPHAGE) 500 MG tablet Take 1 tablet (500 mg total) by mouth 2 (two) times daily with a meal. 180 tablet 0   ROCKLATAN 0.02-0.005 % SOLN Place 1 drop into the right eye at bedtime.     sildenafil (REVATIO) 20 MG tablet TAKE 2 TO 5 TABLETS BY MOUTH AS NEEDED FOR 30 MINUTES PRIOR TO SEXUAL ACTIVITY (Patient taking differently: Take 40-100 mg by mouth daily as needed (Take 30 minutes prior to sexual activity).) 20 tablet 2    Labs / Images  Lab Results:  Admission on 08/23/2022  Component Date Value Ref Range Status   SARS Coronavirus 2 by RT PCR 08/23/2022 NEGATIVE  NEGATIVE Final   Comment: (NOTE) SARS-CoV-2 target nucleic acids are NOT DETECTED.  The SARS-CoV-2 RNA is generally detectable in upper respiratory specimens during the acute phase of infection. The lowest concentration of SARS-CoV-2 viral copies this assay can detect is 138 copies/mL. A negative result does not preclude SARS-Cov-2 infection and should not be used as the sole basis for treatment or other patient management decisions. A negative result may occur with  improper specimen collection/handling, submission of specimen other than nasopharyngeal swab, presence of viral mutation(s) within the areas targeted by this assay, and inadequate number of viral copies(<138 copies/mL). A negative result must be combined with clinical observations, patient history, and epidemiological information. The expected result is Negative.  Fact Sheet for Patients:  EntrepreneurPulse.com.au  Fact Sheet for Healthcare Providers:  IncredibleEmployment.be  This test is no                          t yet approved or cleared by the [place] FDA and  has been authorized for detection and/or diagnosis of SARS-CoV-2 by FDA under an Emergency Use Authorization (EUA). This EUA will remain  in  effect (meaning this test can be used) for the duration of the COVID-19 declaration under Section 564(b)(1) of the Act, 21 U.S.C.section 360bbb-3(b)(1), unless the authorization is terminated  or revoked sooner.       Influenza A by PCR 08/23/2022 NEGATIVE  NEGATIVE Final   Influenza B by PCR 08/23/2022 NEGATIVE  NEGATIVE Final   Comment: (NOTE) The Xpert Xpress SARS-CoV-2/FLU/RSV plus assay is intended as an aid in the diagnosis of influenza from Nasopharyngeal swab specimens and should not be used as a sole basis for treatment. Nasal washings and aspirates are unacceptable for Xpert Xpress SARS-CoV-2/FLU/RSV testing.  Fact Sheet for Patients: EntrepreneurPulse.com.au  Fact Sheet for Healthcare Providers: IncredibleEmployment.be  This test is not yet approved or cleared by the [place] FDA and has been authorized for detection and/or diagnosis of SARS-CoV-2 by FDA under an Emergency Use Authorization (EUA). This EUA will remain in effect (meaning this test can be used) for the duration of the COVID-19 declaration under Section 564(b)(1) of the Act, 21 U.S.C. section 360bbb-3(b)(1), unless the authorization is terminated or revoked.  Performed at [facility], [place] [address] [place], [place] [address]    WBC 08/23/2022 5.9  4.0 - 10.5 K/uL Final   RBC 08/23/2022 4.16 (L)  4.22 - 5.81 MIL/uL Final  Hemoglobin 08/23/2022 13.7  13.0 - 17.0 g/dL Final   HCT 08/23/2022 37.9 (L)  39.0 - 52.0 % Final   MCV 08/23/2022 91.1  80.0 - 100.0 fL Final   MCH 08/23/2022 32.9  26.0 - 34.0 pg Final   MCHC 08/23/2022 36.1 (H)  30.0 - 36.0 g/dL Final   RDW 08/23/2022 14.0  11.5 - 15.5 % Final   Platelets 08/23/2022 PLATELET CLUMPS NOTED ON SMEAR, UNABLE TO ESTIMATE  150 - 400 K/uL Final   REPEATED TO VERIFY   nRBC 08/23/2022 0.0  0.0 - 0.2 % Final   Neutrophils Relative % 08/23/2022 58  % Final   Neutro Abs 08/23/2022 3.5  1.7 - 7.7 K/uL Final    Lymphocytes Relative 08/23/2022 33  % Final   Lymphs Abs 08/23/2022 1.9  0.7 - 4.0 K/uL Final   Monocytes Relative 08/23/2022 6  % Final   Monocytes Absolute 08/23/2022 0.3  0.1 - 1.0 K/uL Final   Eosinophils Relative 08/23/2022 2  % Final   Eosinophils Absolute 08/23/2022 0.1  0.0 - 0.5 K/uL Final   Basophils Relative 08/23/2022 1  % Final   Basophils Absolute 08/23/2022 0.0  0.0 - 0.1 K/uL Final   Immature Granulocytes 08/23/2022 0  % Final   Abs Immature Granulocytes 08/23/2022 0.02  0.00 - 0.07 K/uL Final   Performed at [facility], [place] [address], [place], [place] [address]   Sodium 08/23/2022 139  135 - 145 mmol/L Final   Potassium 08/23/2022 3.5  3.5 - 5.1 mmol/L Final   Chloride 08/23/2022 101  98 - 111 mmol/L Final   CO2 08/23/2022 31  22 - 32 mmol/L Final   Glucose, Bld 08/23/2022 204 (H)  70 - 99 mg/dL Final   Glucose reference range applies only to samples taken after fasting for at least 8 hours.   BUN 08/23/2022 16  6 - 20 mg/dL Final   Creatinine, Ser 08/23/2022 0.95  0.61 - 1.24 mg/dL Final   Calcium 08/23/2022 9.5  8.9 - 10.3 mg/dL Final   Total Protein 08/23/2022 6.9  6.5 - 8.1 g/dL Final   Albumin 08/23/2022 3.9  3.5 - 5.0 g/dL Final   AST 08/23/2022 24  15 - 41 U/L Final   ALT 08/23/2022 22  0 - 44 U/L Final   Alkaline Phosphatase 08/23/2022 57  38 - 126 U/L Final   Total Bilirubin 08/23/2022 0.6  0.3 - 1.2 mg/dL Final   GFR, Estimated 08/23/2022 >60  >60 mL/min Final   Comment: (NOTE) Calculated using the CKD-EPI Creatinine Equation (2021)    Anion gap 08/23/2022 7  5 - 15 Final   Performed at [facility] [address] [place], [place] [address]   Alcohol, Ethyl (B) 08/23/2022 <10  <10 mg/dL Final   Comment: (NOTE) Lowest detectable limit for serum alcohol is 10 mg/dL.  For medical purposes only. Performed at [facility], [place] [address], [place], [place] [address]    POC Amphetamine UR 08/23/2022 None Detected  NONE DETECTED (Cut  Off Level 1000 ng/mL) Final   POC Secobarbital (BAR) 08/23/2022 None Detected  NONE DETECTED (Cut Off Level 300 ng/mL) Final   POC Buprenorphine (BUP) 08/23/2022 None Detected  NONE DETECTED (Cut Off Level 10 ng/mL) Final   POC Oxazepam (BZO) 08/23/2022 None Detected  NONE DETECTED (Cut Off Level 300 ng/mL) Final   POC Cocaine UR 08/23/2022 Positive (A)  NONE DETECTED (Cut Off Level 300 ng/mL) Final  POC Methamphetamine UR 08/23/2022 None Detected  NONE DETECTED (Cut Off Level 1000 ng/mL) Final   POC Morphine 08/23/2022 None Detected  NONE DETECTED (Cut Off Level 300 ng/mL) Final   POC Methadone UR 08/23/2022 None Detected  NONE DETECTED (Cut Off Level 300 ng/mL) Final   POC Oxycodone UR 08/23/2022 None Detected  NONE DETECTED (Cut Off Level 100 ng/mL) Final   POC Marijuana UR 08/23/2022 Positive (A)  NONE DETECTED (Cut Off Level 50 ng/mL) Final   SARSCOV2ONAVIRUS 2 AG 08/23/2022 NEGATIVE  NEGATIVE Final   Comment: (NOTE) SARS-CoV-2 antigen NOT DETECTED.   Negative results are presumptive.  Negative results do not preclude SARS-CoV-2 infection and should not be used as the sole basis for treatment or other patient management decisions, including infection  control decisions, particularly in the presence of clinical signs and  symptoms consistent with COVID-19, or in those who have been in contact with the virus.  Negative results must be combined with clinical observations, patient history, and epidemiological information. The expected result is Negative.  Fact Sheet for Patients: HandmadeRecipes.com.cy  Fact Sheet for Healthcare Providers: FuneralLife.at  This test is not yet approved or cleared by the [place] FDA and  has been authorized for detection and/or diagnosis of SARS-CoV-2 by FDA under an Emergency Use Authorization (EUA).  This EUA will remain in effect (meaning this test can be used) for the duration of  the COV                           ID-19 declaration under Section 564(b)(1) of the Act, 21 U.S.C. section 360bbb-3(b)(1), unless the authorization is terminated or revoked sooner.     Cholesterol 08/23/2022 220 (H)  0 - 200 mg/dL Final   Triglycerides 08/23/2022 112  <150 mg/dL Final   HDL 08/23/2022 61  >40 mg/dL Final   Total CHOL/HDL Ratio 08/23/2022 3.6  RATIO Final   VLDL 08/23/2022 22  0 - 40 mg/dL Final   LDL Cholesterol 08/23/2022 137 (H)  0 - 99 mg/dL Final   Comment:        Total Cholesterol/HDL:CHD Risk Coronary Heart Disease Risk Table                     Men   Women  1/2 Average Risk   3.4   3.3  Average Risk       5.0   4.4  2 X Average Risk   9.6   7.1  3 X Average Risk  23.4   11.0        Use the calculated Patient Ratio above and the CHD Risk Table to determine the patient's CHD Risk.        ATP III CLASSIFICATION (LDL):  <100     mg/dL   Optimal  100-129  mg/dL   Near or Above                    Optimal  130-159  mg/dL   Borderline  160-189  mg/dL   High  >190     mg/dL   Very High Performed at [facility] [address], [place], [place] [address]    TSH 08/23/2022 1.406  0.350 - 4.500 uIU/mL Final   Comment: Performed by a 3rd Generation assay with a functional sensitivity of <=0.01 uIU/mL. Performed at [facility], [place] [address] [place], [place] [address]   Congregational Nurse Program  on 08/09/2022  Component Date Value Ref Range Status   POC Glucose 08/09/2022 253 (A)  70 - 99 mg/dl Final   non fasting  Congregational Nurse Program on 07/26/2022  Component Date Value Ref Range Status   POC Glucose 07/26/2022 175 (A)  70 - 99 mg/dl Final   non fasting  Congregational Nurse Program on 07/21/2022  Component Date Value Ref Range Status   POC Glucose 07/21/2022 302 (A)  70 - 99 mg/dl Final   non fasting  Congregational Nurse Program on 07/12/2022  Component Date Value Ref Range Status   POC Glucose 07/12/2022 225 (A)  70 - 99 mg/dl Final   non  fasting  Congregational Nurse Program on 07/07/2022  Component Date Value Ref Range Status   POC Glucose 07/07/2022 274 (A)  70 - 99 mg/dl Final   non fasting  Office Visit on 07/05/2022  Component Date Value Ref Range Status   Hemoglobin A1C 07/05/2022 8.0 (A)  4.0 - 5.6 % Final   Glucose-Capillary 07/05/2022 183 (H)  70 - 99 mg/dL Final   Glucose reference range applies only to samples taken after fasting for at least 8 hours.   Glucose 07/05/2022 175 (H)  70 - 99 mg/dL Final   BUN 07/05/2022 16  6 - 24 mg/dL Final   Creatinine, Ser 07/05/2022 1.04  0.76 - 1.27 mg/dL Final   eGFR 07/05/2022 83  >59 mL/min/1.73 Final   BUN/Creatinine Ratio 07/05/2022 15  9 - 20 Final   Sodium 07/05/2022 142  134 - 144 mmol/L Final   Potassium 07/05/2022 4.1  3.5 - 5.2 mmol/L Final   Chloride 07/05/2022 103  96 - 106 mmol/L Final   CO2 07/05/2022 22  20 - 29 mmol/L Final   Anion Gap 07/05/2022 17.0  10.0 - 18.0 mmol/L Final   Calcium 07/05/2022 9.7  8.7 - 10.2 mg/dL Final   Total Protein 07/05/2022 7.3  6.0 - 8.5 g/dL Final   Albumin 07/05/2022 4.4  3.8 - 4.9 g/dL Final   Globulin, Total 07/05/2022 2.9  1.5 - 4.5 g/dL Final   Albumin/Globulin Ratio 07/05/2022 1.5  1.2 - 2.2 Final   Bilirubin Total 07/05/2022 0.3  0.0 - 1.2 mg/dL Final   Alkaline Phosphatase 07/05/2022 69  44 - 121 IU/L Final   AST 07/05/2022 26  0 - 40 IU/L Final   ALT 07/05/2022 22  0 - 44 IU/L Final  Congregational Nurse Program on 06/21/2022  Component Date Value Ref Range Status   POC Glucose 06/21/2022 284 (A)  70 - 99 mg/dl Final   non fasting  Congregational Nurse Program on 06/19/2022  Component Date Value Ref Range Status   POC Glucose 06/19/2022 287 (A)  70 - 99 mg/dl Final   non fasting  Congregational Nurse Program on 06/09/2022  Component Date Value Ref Range Status   POC Glucose 06/09/2022 220 (A)  70 - 99 mg/dl Final   non fasting  There may be more visits with results that are not included.   Blood Alcohol  level:  Lab Results  Component Value Date   ETH <10 08/23/2022   ETH 12 (H) 12/45/8099   Metabolic Disorder Labs: Lab Results  Component Value Date   HGBA1C 8.0 (A) 07/05/2022   MPG 367 02/17/2017   MPG 258 04/19/2015   No results found for: "PROLACTIN" Lab Results  Component Value Date   CHOL 220 (H) 08/23/2022   TRIG 112 08/23/2022   HDL 61 08/23/2022   CHOLHDL  3.6 08/23/2022   VLDL 22 08/23/2022   LDLCALC 137 (H) 08/23/2022   LDLCALC 128 (H) 10/12/2021   Therapeutic Lab Levels: No results found for: "LITHIUM" No results found for: "VALPROATE" No results found for: "CBMZ" Physical Findings   PHQ2-9    Flowsheet Row ED from 08/23/2022 in [facility] Office Visit from 07/05/2022 in [facility] [place] Office Visit from 04/12/2022 in [place] Office Visit from 01/11/2022 in [place] Office Visit from 10/12/2021 in [facility]  PHQ-2 Total Score 2 0 0 0 0  PHQ-9 Total Score 6 -- -- -- --      Flowsheet Row ED from 08/23/2022 in [facility] ED from 10/10/2021 in [place] ED from 06/27/2021 in [place] [facility] No Risk No Risk No Risk       Musculoskeletal  Strength & Muscle Tone: grossly within normal limits Gait & Station: nml station, gait short steps Patient leans: [name]   Psychiatric Specialty Exam   Presentation  General Appearance:Disheveled Eye Contact:Minimal (Patient will often stare off, however able to focus on voice when asked her questions.  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) Speech:Clear and Coherent, Normal Rate Volume:Normal Handedness:Right  Mood and Affect  Mood:Dysphoric, Depressed Affect:Appropriate, Congruent, Full Range  Thought Process  Thought Process:Coherent,  Irrevelant Descriptions of Associations:Circumstantial  Thought Content Suicidal Thoughts:Suicidal Thoughts: No Homicidal Thoughts:Homicidal Thoughts: No Hallucinations:Hallucinations: None Ideas of Reference:None Thought Content:Logical, WDL, Scattered, Rumination  Sensorium  Memory:Immediate Good Judgment:Fair Insight:Fair  Executive Functions  Orientation:Full (Time, Place and Person) Language:Good Concentration:Good [place] of Knowledge:Good  Psychomotor Activity  Psychomotor Activity:Psychomotor Activity: Normal  Assets  Assets:Communication Skills, Desire for Improvement, Leisure Time, Resilience, Social Support  Sleep  Quality:Fair  Physical Exam  BP 138/84 (BP Location: Right Arm)   Pulse 73   Temp 98.7 F (37.1 C) (Oral)   Resp 18   SpO2 100%  Physical Exam Vitals and nursing note reviewed.  Constitutional:      General: He is not in acute distress.    Appearance: He is not ill-appearing, toxic-appearing or diaphoretic.  HENT:     Head: Normocephalic.  Eyes:     General:        Right eye: No discharge.        Left eye: No discharge.     Comments: OS mild scleral injection  Pulmonary:     Effort: Pulmonary effort is normal. No respiratory distress.  Neurological:     Mental Status: He is alert.      Assessment / Plan  Total Time spent with patient: 30 minutes Treatment Plan Summary: Daily contact with patient to assess and evaluate symptoms and progress in treatment and Medication management  Principal Problem:   Alcohol use disorder, severe, dependence (HCC) Active Problems:   Hypertension   Hyperlipidemia LDL goal <100   Glaucoma   History of tobacco use   Diabetes mellitus with diabetic cataract ([place])   Cannabis use disorder   Housing instability   Brady Church is a 61 y.o. 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  UDS + cocaine, marijuana. BAL neg.  Total duration of encounter: 1 day  AUD, severe (action stage) Daily drink about 40oz x2 daily for years, with last drink was 08/22/2022. 1 week prior to presentation, he was able to go 2 days w/o EtOH. Longest length of sobriety was 1 yr + 45moin 2012. Denied h/o seizures and DT. [facility]  BAL neg, AST/ALT wnl. Reported no sxs of etoh w/d specifically  CIWA with ativan PRN per protocol with thiamine & MV supplement Started naltrexone, 50 mg, QHS  SIMD Screened negative for mania, SSRI naive.   Started sertraline, 25 mg, Daily  Cannabis use d/o (precontemplative stage)  h/o tobacco use d/o, sustained remission Encouraged cessation Comfort PRNs   STABLE HLD atorvastatin, 80 mg, q1800 Glaucoma brimonidine, 1 drop, BID dorzolamide-timolol, 1 drop, BID Netarsudil-Latanoprost, 1 drop, QHS fluorometholone, 1 drop, Daily DM2  Peripheral neuropathy metFORMIN, 500 mg, BID WC gabapentin, 300 mg, TID HTN hydrochlorothiazide, 25 mg, Daily lisinopril, 40 mg, Daily  Considerations for follow-up: Recommend high risk screening every 6-12 months with HIV, RPR, hepatitis panel Recommend monitoring LFT every 6-12 months while on naltrexone  DISPO: IS amenable to residential rehab. Will attempt to dc patient door-to-door, however if unable will discuss with CD-IOP as bridge to rehab.  Tentative date: 10/2 or 10/3 Location: [facility] maybe  Signed: JMerrily Brittle DO Psychiatry Resident, PGY-2 08/24/2022, 8:22 PM   [facility]GLemoore Whitney [address]Dept: [phone] Fax: [phone]

## 2022-08-24 NOTE — ED Notes (Signed)
Pt is in the bed sleeping. Respirations are even and unlabored. No acute distress noted. Will continue to monitor for safety. 

## 2022-08-24 NOTE — ED Notes (Signed)
Patient continues to rest with no sxs of distress noted - will continue to monitor 

## 2022-08-24 NOTE — BH IP Treatment Plan (Signed)
Interdisciplinary Treatment and Diagnostic Plan Update  08/24/2022 Time of Session: 1018 Brady Church MRN: [id number]  Diagnosis:  Final diagnoses:  Polysubstance abuse (HCC)     Current Medications:  Current Facility-Administered Medications  Medication Dose Route Frequency Provider Last Rate Last Admin   acetaminophen (TYLENOL) tablet 650 mg  650 mg Oral Q6H PRN Lauree Chandler, NP       alum & mag hydroxide-simeth (MAALOX/MYLANTA) 200-200-20 MG/5ML suspension 30 mL  30 mL Oral Q4H PRN Lauree Chandler, NP       atorvastatin (LIPITOR) tablet 80 mg  80 mg Oral q1800 Lauree Chandler, NP   80 mg at 08/23/22 1817   brimonidine (ALPHAGAN) 0.15 % ophthalmic solution 1 drop  1 drop Both Eyes BID Lauree Chandler, NP   1 drop at 08/24/22 1158   dorzolamide-timolol (COSOPT) 22.3-6.8 MG/ML ophthalmic solution 1 drop  1 drop Right Eye BID Lauree Chandler, NP   1 drop at 08/24/22 0926   fluorometholone (FML) 0.1 % ophthalmic suspension 1 drop  1 drop Both Eyes Daily Nelly Rout, MD   1 drop at 08/24/22 0925   gabapentin (NEURONTIN) capsule 300 mg  300 mg Oral TID Lauree Chandler, NP   300 mg at 08/24/22 6122   hydrochlorothiazide (HYDRODIURIL) tablet 25 mg  25 mg Oral Daily Lauree Chandler, NP   25 mg at 08/24/22 4497   hydrOXYzine (ATARAX) tablet 25 mg  25 mg Oral Q6H PRN Lauree Chandler, NP       lisinopril (ZESTRIL) tablet 40 mg  40 mg Oral Daily Lauree Chandler, NP   40 mg at 08/24/22 5300   loperamide (IMODIUM) capsule 2-4 mg  2-4 mg Oral PRN Lauree Chandler, NP       LORazepam (ATIVAN) tablet 1 mg  1 mg Oral Q6H PRN Lauree Chandler, NP       magnesium hydroxide (MILK OF MAGNESIA) suspension 30 mL  30 mL Oral Daily PRN Lauree Chandler, NP       metFORMIN (GLUCOPHAGE) tablet 500 mg  500 mg Oral BID WC Lauree Chandler, NP   500 mg at 08/24/22 0841   multivitamin with minerals tablet 1 tablet  1 tablet Oral Daily Lauree Chandler, NP   1  tablet at 08/24/22 5110   naltrexone (DEPADE) tablet 50 mg  50 mg Oral QHS Princess Bruins, DO       Netarsudil-Latanoprost 0.02-0.005 % SOLN 1 drop  1 drop Right Eye QHS Lauree Chandler, NP       sertraline (ZOLOFT) tablet 25 mg  25 mg Oral Daily Princess Bruins, DO   25 mg at 08/24/22 1154   thiamine (VITAMIN B1) tablet 100 mg  100 mg Oral Daily Lauree Chandler, NP   100 mg at 08/24/22 2111   Current Outpatient Medications  Medication Sig Dispense Refill   ALPHAGAN P 0.1 % SOLN INSTILL 1 DROP INTO EACH EYE TWICE DAILY (Patient taking differently: Place 1 drop into both eyes in the morning and at bedtime.) 5 mL 5   aspirin 325 MG tablet Take 325 mg by mouth daily.     atorvastatin (LIPITOR) 80 MG tablet TAKE 1 TABLET BY MOUTH ONCE DAILY AT  6  PM (Patient taking differently: Take 80 mg by mouth daily at 6 PM.) 90 tablet 3   dorzolamide-timolol (COSOPT) 22.3-6.8 MG/ML ophthalmic solution Place 1 drop into the right eye 2 (two) times daily. 10 mL 5  fluorometholone (FML) 0.1 % ophthalmic suspension INSTILL 1 DROP INTO EACH EYE ONCE DAILY (Patient taking differently: Place 1 drop into both eyes daily.) 5 mL 5   gabapentin (NEURONTIN) 300 MG capsule Take 1 capsule (300 mg total) by mouth 3 (three) times daily. 90 capsule 11   hydrochlorothiazide (HYDRODIURIL) 25 MG tablet Take 1 tablet (25 mg total) by mouth daily. 90 tablet 0   lisinopril (ZESTRIL) 40 MG tablet Take 1 tablet (40 mg total) by mouth daily. 30 tablet 5   metFORMIN (GLUCOPHAGE) 500 MG tablet Take 1 tablet (500 mg total) by mouth 2 (two) times daily with a meal. 180 tablet 0   ROCKLATAN 0.02-0.005 % SOLN Place 1 drop into the right eye at bedtime.     sildenafil (REVATIO) 20 MG tablet TAKE 2 TO 5 TABLETS BY MOUTH AS NEEDED FOR 30 MINUTES PRIOR TO SEXUAL ACTIVITY (Patient taking differently: Take 40-100 mg by mouth daily as needed (Take 30 minutes prior to sexual activity).) 20 tablet 2   PTA Medications: Prior to Admission  medications   Medication Sig Start Date End Date Taking? Authorizing Provider  ALPHAGAN P 0.1 % SOLN INSTILL 1 DROP INTO EACH EYE TWICE DAILY Patient taking differently: Place 1 drop into both eyes in the morning and at bedtime. 10/05/20  Yes Andrew Au, MD  aspirin 325 MG tablet Take 325 mg by mouth daily.   Yes [provider]  atorvastatin (LIPITOR) 80 MG tablet TAKE 1 TABLET BY MOUTH ONCE DAILY AT  6  PM Patient taking differently: Take 80 mg by mouth daily at 6 PM. 07/05/22  Yes [name], MD  dorzolamide-timolol (COSOPT) 22.3-6.8 MG/ML ophthalmic solution Place 1 drop into the right eye 2 (two) times daily. 10/05/20  Yes Andrew Au, MD  fluorometholone (FML) 0.1 % ophthalmic suspension INSTILL 1 DROP INTO EACH EYE ONCE DAILY Patient taking differently: Place 1 drop into both eyes daily. 10/05/20  Yes Andrew Au, MD  gabapentin (NEURONTIN) 300 MG capsule Take 1 capsule (300 mg total) by mouth 3 (three) times daily. 01/11/22  Yes [name], MD  hydrochlorothiazide (HYDRODIURIL) 25 MG tablet Take 1 tablet (25 mg total) by mouth daily. 07/05/22  Yes [name], MD  lisinopril (ZESTRIL) 40 MG tablet Take 1 tablet (40 mg total) by mouth daily. 01/11/22  Yes [name], MD  metFORMIN (GLUCOPHAGE) 500 MG tablet Take 1 tablet (500 mg total) by mouth 2 (two) times daily with a meal. 07/05/22 07/05/23 Yes [name], MD  ROCKLATAN 0.02-0.005 % SOLN Place 1 drop into the right eye at bedtime. 07/19/22  Yes [provider]  sildenafil (REVATIO) 20 MG tablet TAKE 2 TO 5 TABLETS BY MOUTH AS NEEDED FOR 30 MINUTES PRIOR TO SEXUAL ACTIVITY Patient taking differently: Take 40-100 mg by mouth daily as needed (Take 30 minutes prior to sexual activity). 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  Treatment Modalities: Medication Management, Group therapy, Case management,  1 to 1 session with clinician, Psychoeducation,  Recreational therapy.   Physician Treatment Plan for Primary and Secondary Diagnosis:  Final diagnoses:  Polysubstance abuse (HCC)   Long Term Goal(s): Improvement in symptoms so as ready for discharge  Short Term Goals: Patient will verbalize feelings in meetings with treatment team members. Patient will attend at least of 50% of the groups daily. Pt will complete the PHQ9 on admission, day 3 and discharge. Patient will take medications as prescribed daily.  Medication Management: Evaluate patient's response, side effects, and tolerance of medication regimen.  Therapeutic Interventions: 1 to 1 sessions, Unit Group sessions and Medication administration.  Evaluation of Outcomes: Progressing  LCSW Treatment Plan for Primary Diagnosis:  Final diagnoses:  Polysubstance abuse (HCC)    Long Term Goal(s): Safe transition to appropriate next level of care at discharge.  Short Term Goals: Facilitate acceptance of mental health diagnosis and concerns through verbal commitment to aftercare plan and appointments at discharge., Patient will identify one social support prior to discharge to aid in patient's recovery., Patient will attend AA/NA groups as scheduled., Identify minimum of 2 triggers associated with mental health/substance abuse issues with treatment team members., and Increase skills for wellness and recovery by attending 50% of scheduled groups.  Therapeutic Interventions: Assess for all discharge needs, 1 to 1 time with Child psychotherapist, Explore available  resources and support systems, Assess for adequacy in community support network, Educate family and significant other(s) on suicide prevention, Complete Psychosocial Assessment, Interpersonal group therapy.  Evaluation of Outcomes: Progressing   Progress in Treatment: Attending groups: No. Participating in groups: No. Taking medication as prescribed: Yes. Toleration medication: Yes. Family/Significant other contact made: No, will contact:  Prefers that no one in his family know that he is here. A friend he considers as a brother brought him to urgent care. Patient understands diagnosis: Yes. Discussing patient identified problems/goals with staff: Yes. Medical problems stabilized or resolved: Yes. and As evidenced by:  Still looking to get surgery to correct his eyesight so he will no longer be legally blind. Denies suicidal/homicidal ideation: Yes. Issues/concerns per patient self-inventory: No. Other: Needing his birth certificate to move forward with getting community assistance with a caseworker at [facility].  New problem(s) identified: No, Describe:  Nothing more than what has been discussed.  New Short Term/Long Term Goal(s):  "Find housing, get a vehicle, travel, do things I like to do.  Open a hot dog stand. Work."  Patient Goals:  To get into a residential rehab facility such as [facility] so he can still live independently while getting structured treatment.  Discharge Plan or Barriers: Legally blind, had a stroke 57yrs ago, homeless.  Reason for Continuation of Hospitalization: Withdrawal symptoms  Estimated Length of Stay:  Last 3 [place] Suicide Severity Risk Score: Flowsheet Row ED from 08/23/2022 in [facility] ED from 10/10/2021 in [facility] EMERGENCY DEPARTMENT ED from 06/27/2021 in [facility] EM[facility]GENCY DEPARTMENT  C-SSRS RISK CATEGORY No Risk No Risk No Risk       Last PHQ 2/9 Scores:     08/23/2022   12:06 PM 07/05/2022    9:31 AM 04/12/2022   10:46 AM  Depression screen PHQ 2/9  Decreased Interest 1 0 0  Down, Depressed, Hopeless 1 0 0  PHQ - 2 Score 2 0 0  Altered sleeping 0    Tired, decreased energy 1    Change in appetite 0    Feeling bad or failure about yourself  1  Trouble concentrating 2    Moving slowly or fidgety/restless 0    Suicidal thoughts 0    PHQ-9 Score 6    Difficult doing work/chores Somewhat difficult      Scribe for Treatment Team: Jake Samples, LCSW 08/24/2022 3:59 PM

## 2022-08-24 NOTE — ED Notes (Signed)
Pt is in the dayroom watching TV.  Respirations are even and unlabored. No acute distress noted. Will continue to monitor for safety. 

## 2022-08-25 DIAGNOSIS — I10 Essential (primary) hypertension: Secondary | ICD-10-CM | POA: Diagnosis not present

## 2022-08-25 DIAGNOSIS — Z20822 Contact with and (suspected) exposure to covid-19: Secondary | ICD-10-CM | POA: Diagnosis not present

## 2022-08-25 DIAGNOSIS — H409 Unspecified glaucoma: Secondary | ICD-10-CM | POA: Diagnosis not present

## 2022-08-25 DIAGNOSIS — F1910 Other psychoactive substance abuse, uncomplicated: Secondary | ICD-10-CM | POA: Diagnosis not present

## 2022-08-25 MED ORDER — HYDROCHLOROTHIAZIDE 25 MG PO TABS
50.0000 mg | ORAL_TABLET | Freq: Every day | ORAL | Status: DC
  Administered 2022-08-26 – 2022-08-30 (×5): 50 mg via ORAL
  Filled 2022-08-25 (×2): qty 2
  Filled 2022-08-25 (×2): qty 28
  Filled 2022-08-25: qty 2
  Filled 2022-08-25: qty 28
  Filled 2022-08-25 (×2): qty 2

## 2022-08-25 MED FILL — Netarsudil Dimesylate-Latanoprost Ophth Soln 0.02-0.005%: OPHTHALMIC | Qty: 2.5 | Status: AC

## 2022-08-25 NOTE — ED Notes (Signed)
Patient A&Ox4. Denies intent to harm self/others when asked. Denies A/VH. Patient denies any physical complaints when asked. No acute distress noted. Support and encouragement provided. Routine safety checks conducted according to facility protocol. Encouraged patient to notify staff if thoughts of harm toward self or others arise. Patient verbalize understanding and agreement. Will continue to monitor for safety.    

## 2022-08-25 NOTE — ED Notes (Signed)
Pt sleeping in no acute distress. RR even and unlabored. Safety maintained. 

## 2022-08-25 NOTE — ED Notes (Signed)
Pt sitting in dayroom watching tv and interacting with peers. No acute distress noted. Informed pt to notify staff for any needs or concerns. Will continue to monitor for safety.

## 2022-08-25 NOTE — ED Provider Notes (Signed)
[facility] Crisis Behavioral Health Progress Note  Date & Time: 08/25/2022 7:09 PM Name: Brady Church Age: 61 y.o.  DOB: 1961-11-02  MRN: [id number]  Diagnosis:  Final diagnoses:  Polysubstance abuse [facility])    Reason for presentation: No chief complaint on file.  Brief HPI  Brady Church is a 61 y.o. 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  UDS + cocaine, marijuana. BAL neg.  Interval Hx   Patient Narrative:   Patient was initially seen this AM, sitting in the day room, comfortable, no acute distress.  Patient denied cravings or withdrawal symptoms.  Denied side effects from Zoloft and naltrexone.  Patient had no other questions or concerns, amenable to plan per below.  FW:YOVZCHYI Thoughts: No FO:YDXAJOINO Thoughts: No MVE:[id number]: None  Mood: Euthymic Sleep:Good Appetite: Good Review of Systems  Respiratory:  Negative for shortness of breath.   Cardiovascular:  Negative for chest pain.  Gastrointestinal:  Negative for nausea and vomiting.  Neurological:  Negative for dizziness and headaches.    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 Age of First Use: 24s.  2-40 oz beers. Frequency: daily. Duration: "years". Tobacco:  reports that he quit smoking about 5 years ago. His smoking use included cigarettes. He smoked an average of .5 packs per day. He has never used smokeless tobacco.  Cannabis: yes Opiates: Denied Stimulants: cocaine. Age of First Use: 80s. Frequency: 3-4 x per week or "when I can get it". 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 Stopped in 2010   Tobacco use disorder 10/07/2014    Past Surgical History:  Procedure Laterality Date   CATARACT EXTRACTION  01/08/2012   Right eye   EYE SURGERY     x 4 Brady    Family History:  Family History  Problem Relation Age of Onset   Coronary artery disease Mother 31       died of MI   Cancer Father 43       Some GI cancer. Not sure if it was Colon Cancer or not.   Hypertension Father    Diabetes Maternal Grandmother  Social History   Substance and Sexual Activity  Alcohol Use Yes   Comment: Beer- 40 oz. every day.    Social History   Substance and Sexual Activity  Drug Use Not Currently   Types: Cocaine, Marijuana   Comment: Sometimes.    Social History   Socioeconomic History   Marital status: Single    Spouse name: Not on file   Number of children: Not on file   Years of education: 12   Highest education level: Not on  file  Occupational History   Not on file  Tobacco Use   Smoking status: Former    Packs/day: 0.50    Types: Cigarettes    Quit date: 02/18/2017    Years since quitting: 5.5   Smokeless tobacco: Never  Substance and Sexual Activity   Alcohol use: Yes    Comment: Beer- 40 oz. every day.   Drug use: Not Currently    Types: Cocaine, Marijuana    Comment: Sometimes.   Sexual activity: Not on file  Other Topics Concern   Not on file  Social History Narrative   Lives in [place] for last 10 years with "friend"  [name]      Is not married and does not have kids.   Working sporadically as a Horticulturist, commercial.    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   Social Determinants of Health   Financial Resource Strain: Not on file  Food Insecurity: Not on file  Transportation Needs: Not on file  Physical Activity: Not on file  Stress: Not on file  Social Connections: Not on file   SDOH: SDOH Screenings   Depression (PHQ2-9): Medium Risk (08/23/2022)  Tobacco Use: Medium Risk (08/24/2022)   Additional Social History: Alcohol / Drug Use Pain Medications: See MAR Prescriptions: See MAR Over the Counter: See MAR History of alcohol / drug use?: Yes Longest period of sobriety (when/how long): denies sobriety periods Negative Consequences of Use: Financial, Personal relationships Withdrawal Symptoms: None Substance #1 Name of Substance 1: ETOH 1 - Age of First Use: 20s 1 - Amount (size/oz): 2-40 oz beers 1 - Frequency: daily 1 - Duration: "years" 1 - Last Use / Amount: last night- 2-40s 1 - Method of Aquiring: NA 1- Route of Use: drinks Substance #2 Name of Substance 2: Cocaine 2 - Age of First Use: 19s 2 - Amount (size/oz): unknown 2 - Frequency: 3-4 x per week or "when I can get it" 2 - Duration: "years" 2 - Last Use / Amount: last night 2 - Method of Aquiring: NA 2 - Route of Substance Use: smokes Current Medications   Current  Facility-Administered Medications  Medication Dose Route Frequency Provider Last Rate Last Admin   acetaminophen (TYLENOL) tablet 650 mg  650 mg Oral Q6H PRN Tharon Aquas, NP       alum & mag hydroxide-simeth (MAALOX/MYLANTA) 200-200-20 MG/5ML suspension 30 mL  30 mL Oral Q4H PRN Tharon Aquas, NP       atorvastatin (LIPITOR) tablet 80 mg  80 mg Oral q1800 Tharon Aquas, NP   80 mg at 08/25/22 1701   brimonidine (ALPHAGAN) 0.15 % ophthalmic solution 1 drop  1 drop Both Eyes BID Tharon Aquas, NP   1 drop at 08/25/22 0933   dorzolamide-timolol (COSOPT) 22.3-6.8 MG/ML ophthalmic solution 1 drop  1 drop Right Eye BID Tharon Aquas, NP   1 drop at 08/25/22 0941   fluorometholone (FML) 0.1 %  ophthalmic suspension 1 drop  1 drop Both Eyes Daily Hampton Abbot, MD   1 drop at 08/25/22 0933   gabapentin (NEURONTIN) capsule 300 mg  300 mg Oral TID Tharon Aquas, NP   300 mg at 08/25/22 1653   hydrochlorothiazide (HYDRODIURIL) tablet 25 mg  25 mg Oral Daily Tharon Aquas, NP   25 mg at 08/25/22 0934   hydrOXYzine (ATARAX) tablet 25 mg  25 mg Oral Q6H PRN Tharon Aquas, NP       lisinopril (ZESTRIL) tablet 40 mg  40 mg Oral Daily Tharon Aquas, NP   40 mg at 08/25/22 9326   loperamide (IMODIUM) capsule 2-4 mg  2-4 mg Oral PRN Tharon Aquas, NP       LORazepam (ATIVAN) tablet 1 mg  1 mg Oral Q6H PRN Tharon Aquas, NP       magnesium hydroxide (MILK OF MAGNESIA) suspension 30 mL  30 mL Oral Daily PRN Tharon Aquas, NP       metFORMIN (GLUCOPHAGE) tablet 500 mg  500 mg Oral BID WC Tharon Aquas, NP   500 mg at 08/25/22 1653   multivitamin with minerals tablet 1 tablet  1 tablet Oral Daily Tharon Aquas, NP   1 tablet at 08/25/22 0934   naltrexone (DEPADE) tablet 50 mg  50 mg Oral QHS Merrily Brittle, DO   50 mg at 08/24/22 2144   Netarsudil-Latanoprost 0.02-0.005 % SOLN 1 drop  1 drop Right Eye QHS Tharon Aquas, NP   1 drop  at 08/24/22 2143   sertraline (ZOLOFT) tablet 50 mg  50 mg Oral Daily Merrily Brittle, DO   50 mg at 08/25/22 7124   thiamine (VITAMIN B1) tablet 100 mg  100 mg Oral Daily Tharon Aquas, NP   100 mg at 08/25/22 5809   Current Outpatient Medications  Medication Sig Dispense Refill   ALPHAGAN P 0.1 % SOLN INSTILL 1 DROP INTO EACH EYE TWICE DAILY (Patient taking differently: Place 1 drop into both eyes in the morning and at bedtime.) 5 mL 5   aspirin 325 MG tablet Take 325 mg by mouth daily.     atorvastatin (LIPITOR) 80 MG tablet TAKE 1 TABLET BY MOUTH ONCE DAILY AT  6  PM (Patient taking differently: Take 80 mg by mouth daily at 6 PM.) 90 tablet 3   dorzolamide-timolol (COSOPT) 22.3-6.8 MG/ML ophthalmic solution Place 1 drop into the right eye 2 (two) times daily. 10 mL 5   fluorometholone (FML) 0.1 % ophthalmic suspension INSTILL 1 DROP INTO EACH EYE ONCE DAILY (Patient taking differently: Place 1 drop into both eyes daily.) 5 mL 5   gabapentin (NEURONTIN) 300 MG capsule Take 1 capsule (300 mg Brady) by mouth 3 (three) times daily. 90 capsule 11   hydrochlorothiazide (HYDRODIURIL) 25 MG tablet Take 1 tablet (25 mg Brady) by mouth daily. 90 tablet 0   lisinopril (ZESTRIL) 40 MG tablet Take 1 tablet (40 mg Brady) by mouth daily. 30 tablet 5   metFORMIN (GLUCOPHAGE) 500 MG tablet Take 1 tablet (500 mg Brady) by mouth 2 (two) times daily with a meal. 180 tablet 0   ROCKLATAN 0.02-0.005 % SOLN Place 1 drop into the right eye at bedtime.     sildenafil (REVATIO) 20 MG tablet TAKE 2 TO 5 TABLETS BY MOUTH AS NEEDED FOR 30 MINUTES PRIOR TO SEXUAL ACTIVITY (Patient taking differently: Take 40-100 mg by mouth daily as needed (Take 30 minutes prior to  sexual activity).) 20 tablet 2    Labs / Images  Lab Results:  Admission on 08/23/2022  Component Date Value Ref Range Status   SARS Coronavirus 2 by RT PCR 08/23/2022 NEGATIVE  NEGATIVE Final   Comment: (NOTE) SARS-CoV-2 target nucleic acids are NOT  DETECTED.  The SARS-CoV-2 RNA is generally detectable in upper respiratory specimens during the acute phase of infection. The lowest concentration of SARS-CoV-2 viral copies this assay can detect is 138 copies/mL. A negative result does not preclude SARS-Cov-2 infection and should not be used as the sole basis for treatment or other patient management decisions. A negative result may occur with  improper specimen collection/handling, submission of specimen other than nasopharyngeal swab, presence of viral mutation(s) within the areas targeted by this assay, and inadequate number of viral copies(<138 copies/mL). A negative result must be combined with clinical observations, patient history, and epidemiological information. The expected result is Negative.  Fact Sheet for Patients:  EntrepreneurPulse.com.au  Fact Sheet for Healthcare Providers:  IncredibleEmployment.be  This test is no                          t yet approved or cleared by the [place] FDA and  has been authorized for detection and/or diagnosis of SARS-CoV-2 by FDA under an Emergency Use Authorization (EUA). This EUA will remain  in effect (meaning this test can be used) for the duration of the COVID-19 declaration under Section 564(b)(1) of the Act, 21 U.S.C.section 360bbb-3(b)(1), unless the authorization is terminated  or revoked sooner.       Influenza A by PCR 08/23/2022 NEGATIVE  NEGATIVE Final   Influenza B by PCR 08/23/2022 NEGATIVE  NEGATIVE Final   Comment: (NOTE) The Xpert Xpress SARS-CoV-2/FLU/RSV plus assay is intended as an aid in the diagnosis of influenza from Nasopharyngeal swab specimens and should not be used as a sole basis for treatment. Nasal washings and aspirates are unacceptable for Xpert Xpress SARS-CoV-2/FLU/RSV testing.  Fact Sheet for Patients: EntrepreneurPulse.com.au  Fact Sheet for Healthcare  Providers: IncredibleEmployment.be  This test is not yet approved or cleared by the [place] FDA and has been authorized for detection and/or diagnosis of SARS-CoV-2 by FDA under an Emergency Use Authorization (EUA). This EUA will remain in effect (meaning this test can be used) for the duration of the COVID-19 declaration under Section 564(b)(1) of the Act, 21 U.S.C. section 360bbb-3(b)(1), unless the authorization is terminated or revoked.  Performed at [facility], [place] [address], [place], [place] [address]    WBC 08/23/2022 5.9  4.0 - 10.5 K/uL Final   RBC 08/23/2022 4.16 (L)  4.22 - 5.81 MIL/uL Final   Hemoglobin 08/23/2022 13.7  13.0 - 17.0 g/dL Final   HCT 08/23/2022 37.9 (L)  39.0 - 52.0 % Final   MCV 08/23/2022 91.1  80.0 - 100.0 fL Final   MCH 08/23/2022 32.9  26.0 - 34.0 pg Final   MCHC 08/23/2022 36.1 (H)  30.0 - 36.0 g/dL Final   RDW 08/23/2022 14.0  11.5 - 15.5 % Final   Platelets 08/23/2022 PLATELET CLUMPS NOTED ON SMEAR, UNABLE TO ESTIMATE  150 - 400 K/uL Final   REPEATED TO VERIFY   nRBC 08/23/2022 0.0  0.0 - 0.2 % Final   Neutrophils Relative % 08/23/2022 58  % Final   Neutro Abs 08/23/2022 3.5  1.7 - 7.7 K/uL Final   Lymphocytes Relative 08/23/2022 33  % Final   Lymphs Abs  08/23/2022 1.9  0.7 - 4.0 K/uL Final   Monocytes Relative 08/23/2022 6  % Final   Monocytes Absolute 08/23/2022 0.3  0.1 - 1.0 K/uL Final   Eosinophils Relative 08/23/2022 2  % Final   Eosinophils Absolute 08/23/2022 0.1  0.0 - 0.5 K/uL Final   Basophils Relative 08/23/2022 1  % Final   Basophils Absolute 08/23/2022 0.0  0.0 - 0.1 K/uL Final   Immature Granulocytes 08/23/2022 0  % Final   Abs Immature Granulocytes 08/23/2022 0.02  0.00 - 0.07 K/uL Final   Performed at [facility], [place] [address] [place], [place] [address]   Sodium 08/23/2022 139  135 - 145 mmol/L Final   Potassium 08/23/2022 3.5  3.5 - 5.1 mmol/L Final   Chloride 08/23/2022 101  98 -  111 mmol/L Final   CO2 08/23/2022 31  22 - 32 mmol/L Final   Glucose, Bld 08/23/2022 204 (H)  70 - 99 mg/dL Final   Glucose reference range applies only to samples taken after fasting for at least 8 hours.   BUN 08/23/2022 16  6 - 20 mg/dL Final   Creatinine, Ser 08/23/2022 0.95  0.61 - 1.24 mg/dL Final   Calcium 08/23/2022 9.5  8.9 - 10.3 mg/dL Final   Brady Protein 08/23/2022 6.9  6.5 - 8.1 g/dL Final   Albumin 08/23/2022 3.9  3.5 - 5.0 g/dL Final   AST 08/23/2022 24  15 - 41 U/L Final   ALT 08/23/2022 22  0 - 44 U/L Final   Alkaline Phosphatase 08/23/2022 57  38 - 126 U/L Final   Brady Bilirubin 08/23/2022 0.6  0.3 - 1.2 mg/dL Final   GFR, Estimated 08/23/2022 >60  >60 mL/min Final   Comment: (NOTE) Calculated using the CKD-EPI Creatinine Equation (2021)    Anion gap 08/23/2022 7  5 - 15 Final   Performed at [place] [address] [place],  [address]   Alcohol, Ethyl (B) 08/23/2022 <10  <10 mg/dL Final   Comment: (NOTE) Lowest detectable limit for serum alcohol is 10 mg/dL.  For medical purposes only. Performed at [facility], [place] [address] [place], [place] [address]    POC Amphetamine UR 08/23/2022 None Detected  NONE DETECTED (Cut Off Level 1000 ng/mL) Final   POC Secobarbital (BAR) 08/23/2022 None Detected  NONE DETECTED (Cut Off Level 300 ng/mL) Final   POC Buprenorphine (BUP) 08/23/2022 None Detected  NONE DETECTED (Cut Off Level 10 ng/mL) Final   POC Oxazepam (BZO) 08/23/2022 None Detected  NONE DETECTED (Cut Off Level 300 ng/mL) Final   POC Cocaine UR 08/23/2022 Positive (A)  NONE DETECTED (Cut Off Level 300 ng/mL) Final   POC Methamphetamine UR 08/23/2022 None Detected  NONE DETECTED (Cut Off Level 1000 ng/mL) Final   POC Morphine 08/23/2022 None Detected  NONE DETECTED (Cut Off Level 300 ng/mL) Final   POC Methadone UR 08/23/2022 None Detected  NONE DETECTED (Cut Off Level 300 ng/mL) Final   POC Oxycodone UR 08/23/2022 None Detected  NONE  DETECTED (Cut Off Level 100 ng/mL) Final   POC Marijuana UR 08/23/2022 Positive (A)  NONE DETECTED (Cut Off Level 50 ng/mL) Final   SARSCOV2ONAVIRUS 2 AG 08/23/2022 NEGATIVE  NEGATIVE Final   Comment: (NOTE) SARS-CoV-2 antigen NOT DETECTED.   Negative results are presumptive.  Negative results do not preclude SARS-CoV-2 infection and should not be used as the sole basis for treatment or other patient management decisions, including infection  control decisions,  particularly in the presence of clinical signs and  symptoms consistent with COVID-19, or in those who have been in contact with the virus.  Negative results must be combined with clinical observations, patient history, and epidemiological information. The expected result is Negative.  Fact Sheet for Patients: HandmadeRecipes.com.cy  Fact Sheet for Healthcare Providers: FuneralLife.at  This test is not yet approved or cleared by the [place] FDA and  has been authorized for detection and/or diagnosis of SARS-CoV-2 by FDA under an Emergency Use Authorization (EUA).  This EUA will remain in effect (meaning this test can be used) for the duration of  the COV                          ID-19 declaration under Section 564(b)(1) of the Act, 21 U.S.C. section 360bbb-3(b)(1), unless the authorization is terminated or revoked sooner.     Cholesterol 08/23/2022 220 (H)  0 - 200 mg/dL Final   Triglycerides 08/23/2022 112  <150 mg/dL Final   HDL 08/23/2022 61  >40 mg/dL Final   Brady CHOL/HDL Ratio 08/23/2022 3.6  RATIO Final   VLDL 08/23/2022 22  0 - 40 mg/dL Final   LDL Cholesterol 08/23/2022 137 (H)  0 - 99 mg/dL Final   Comment:        Brady Cholesterol/HDL:CHD Risk Coronary Heart Disease Risk Table                     Men   Women  1/2 Average Risk   3.4   3.3  Average Risk       5.0   4.4  2 X Average Risk   9.6   7.1  3 X Average Risk  23.4   11.0        Use the calculated  Patient Ratio above and the CHD Risk Table to determine the patient's CHD Risk.        ATP III CLASSIFICATION (LDL):  <100     mg/dL   Optimal  100-129  mg/dL   Near or Above                    Optimal  130-159  mg/dL   Borderline  160-189  mg/dL   High  >190     mg/dL   Very High Performed at [facility] [address], [place],  [place] [address]    TSH 08/23/2022 1.406  0.350 - 4.500 uIU/mL Final   Comment: Performed by a 3rd Generation assay with a functional sensitivity of <=0.01 uIU/mL. Performed at [facility], [place] [address] [place], [place] [address]   Congregational Nurse Program on 08/09/2022  Component Date Value Ref Range Status   POC Glucose 08/09/2022 253 (A)  70 - 99 mg/dl Final   non fasting  Congregational Nurse Program on 07/26/2022  Component Date Value Ref Range Status   POC Glucose 07/26/2022 175 (A)  70 - 99 mg/dl Final   non fasting  Congregational Nurse Program on 07/21/2022  Component Date Value Ref Range Status   POC Glucose 07/21/2022 302 (A)  70 - 99 mg/dl Final   non fasting  Congregational Nurse Program on 07/12/2022  Component Date Value Ref Range Status   POC Glucose 07/12/2022 225 (A)  70 - 99 mg/dl Final   non fasting  Congregational Nurse Program on 07/07/2022  Component Date Value Ref Range Status   POC Glucose 07/07/2022 274 (  A)  70 - 99 mg/dl Final   non fasting  Office Visit on 07/05/2022  Component Date Value Ref Range Status   Hemoglobin A1C 07/05/2022 8.0 (A)  4.0 - 5.6 % Final   Glucose-Capillary 07/05/2022 183 (H)  70 - 99 mg/dL Final   Glucose reference range applies only to samples taken after fasting for at least 8 hours.   Glucose 07/05/2022 175 (H)  70 - 99 mg/dL Final   BUN 07/05/2022 16  6 - 24 mg/dL Final   Creatinine, Ser 07/05/2022 1.04  0.76 - 1.27 mg/dL Final   eGFR 07/05/2022 83  >59 mL/min/1.73 Final   BUN/Creatinine Ratio 07/05/2022 15  9 - 20 Final   Sodium 07/05/2022 142  134 - 144 mmol/L Final    Potassium 07/05/2022 4.1  3.5 - 5.2 mmol/L Final   Chloride 07/05/2022 103  96 - 106 mmol/L Final   CO2 07/05/2022 22  20 - 29 mmol/L Final   Anion Gap 07/05/2022 17.0  10.0 - 18.0 mmol/L Final   Calcium 07/05/2022 9.7  8.7 - 10.2 mg/dL Final   Brady Protein 07/05/2022 7.3  6.0 - 8.5 g/dL Final   Albumin 07/05/2022 4.4  3.8 - 4.9 g/dL Final   Globulin, Brady 07/05/2022 2.9  1.5 - 4.5 g/dL Final   Albumin/Globulin Ratio 07/05/2022 1.5  1.2 - 2.2 Final   Bilirubin Brady 07/05/2022 0.3  0.0 - 1.2 mg/dL Final   Alkaline Phosphatase 07/05/2022 69  44 - 121 IU/L Final   AST 07/05/2022 26  0 - 40 IU/L Final   ALT 07/05/2022 22  0 - 44 IU/L Final  Congregational Nurse Program on 06/21/2022  Component Date Value Ref Range Status   POC Glucose 06/21/2022 284 (A)  70 - 99 mg/dl Final   non fasting  Congregational Nurse Program on 06/19/2022  Component Date Value Ref Range Status   POC Glucose 06/19/2022 287 (A)  70 - 99 mg/dl Final   non fasting  Congregational Nurse Program on 06/09/2022  Component Date Value Ref Range Status   POC Glucose 06/09/2022 220 (A)  70 - 99 mg/dl Final   non fasting  There may be more visits with results that are not included.   Blood Alcohol level:  Lab Results  Component Value Date   ETH <10 08/23/2022   ETH 12 (H) 62/83/6629   Metabolic Disorder Labs: Lab Results  Component Value Date   HGBA1C 8.0 (A) 07/05/2022   MPG 367 02/17/2017   MPG 258 04/19/2015   No results found for: "PROLACTIN" Lab Results  Component Value Date   CHOL 220 (H) 08/23/2022   TRIG 112 08/23/2022   HDL 61 08/23/2022   CHOLHDL 3.6 08/23/2022   VLDL 22 08/23/2022   LDLCALC 137 (H) 08/23/2022   LDLCALC 128 (H) 10/12/2021   Therapeutic Lab Levels: No results found for: "LITHIUM" No results found for: "VALPROATE" No results found for: "CBMZ" Physical Findings   PHQ2-9    Flowsheet Row ED from 08/23/2022 in [facility] Office Visit from  07/05/2022 in [place] Office Visit from 04/12/2022 in [place] Office Visit from 01/11/2022 in [place] Office Visit from 10/12/2021 in [place]  PHQ-2 Brady Score 2 0 0 0 0  PHQ-9 Brady Score 6 -- -- -- --      [place] ED from 08/23/2022 in [facility] ED from 10/10/2021  in [place] ED from 06/27/2021 in [place] No Risk No Risk No Risk       Musculoskeletal  Strength & Muscle Tone: grossly within normal limits Gait & Station: nml station, gait short steps Patient leans: [name]   Psychiatric Specialty Exam   Presentation  General Appearance:Appropriate for Environment, Casual, Fairly Groomed Eye Contact:Good Speech:Clear and Coherent, Normal Rate Volume:Normal Handedness:Right  Mood and Affect  Mood:Euthymic Affect:Appropriate, Congruent, Full Range  Thought Process  Thought Process:Coherent, Goal Directed, Linear Descriptions of Associations:Intact  Thought Content Suicidal Thoughts:Suicidal Thoughts: No Homicidal Thoughts:Homicidal Thoughts: No Hallucinations:Hallucinations: None Ideas of Reference:None Thought Content:Logical, WDL  Sensorium  Memory:Immediate Good Judgment:Fair Insight:Fair  Executive Functions  Orientation:Full (Time, Place and Person) Language:Good Concentration:Good [place] of Knowledge:Good  Psychomotor Activity  Psychomotor Activity:Psychomotor Activity: Normal  Assets  Assets:Communication Skills, Desire for Improvement  Sleep  Quality:Good  Physical Exam  BP (!) 162/93 (BP Location: Left Arm)   Pulse 82   Temp 98.4 F (36.9 C) (Oral)   Resp 20   SpO2 100%  Physical Exam Vitals and nursing note reviewed.  Constitutional:      General: He is not in acute  distress.    Appearance: He is not ill-appearing, toxic-appearing or diaphoretic.  HENT:     Head: Normocephalic.  Eyes:     General: No scleral icterus.       Right eye: No discharge.        Left eye: No discharge.     Conjunctiva/sclera: Conjunctivae normal.     Comments: OS mild scleral injection  Pulmonary:     Effort: Pulmonary effort is normal. No respiratory distress.  Neurological:     Mental Status: He is alert.      Assessment / Plan  Brady Time spent with patient: 30 minutes Treatment Plan Summary: Daily contact with patient to assess and evaluate symptoms and progress in treatment and Medication management  Principal Problem:   Alcohol use disorder, severe, dependence (HCC) Active Problems:   Hypertension   Hyperlipidemia LDL goal <100   Glaucoma   History of tobacco use   Diabetes mellitus with diabetic cataract ([place])   Cannabis use disorder   Housing instability   Raynard Church is a 61 y.o. 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 for EtOH and cocaine detox, and assistance with residential rehab.  UDS + cocaine, marijuana. BAL neg.  Brady duration of encounter: 2 days  AUD, severe (action stage) Daily drink about 40oz x2 daily for years, with last drink was 08/22/2022. 1 week prior to presentation, he was able to go 2 days w/o EtOH. Longest length of sobriety was 1 yr + 42mo in 2012. Denied h/o seizures and DT. Last time at residential rehab was [facility] was 2012.  BAL neg, AST/ALT wnl. 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  No GI side effects.  Increased sertraline 25 mg  to 50 mg Daily  HTN Systolic (48JEH), UDJ:497 , Min:146 , WYO:378  Diastolic (58IFO), YDX:41, Min:81, Max:108 Increased home hydrochlorothiazide, 25 mg to $Rem'50mg'ghqh$  Daily  Continued lisinopril, 40 mg, Daily  Cannabis use d/o (precontemplative stage)  h/o tobacco use d/o, sustained remission Encouraged cessation Comfort PRNs   STABLE HLD atorvastatin, 80 mg, q1800 Glaucoma brimonidine, 1 drop, BID dorzolamide-timolol, 1 drop, BID Netarsudil-Latanoprost, 1 drop, QHS fluorometholone, 1 drop, Daily DM2  Peripheral neuropathy metFORMIN, 500 mg, BID WC gabapentin, 300 mg, TID   Considerations for follow-up: Recommend high risk screening every 6-12 months with HIV, RPR, hepatitis panel Recommend monitoring LFT every 6-12 months while on naltrexone  DISPO: IS amenable to residential rehab. Will attempt to dc patient door-to-door, however if unable will discuss with CD-IOP as bridge to rehab.  Tentative date: 10/2 or 10/3 Location: [facility] maybe  Signed: Merrily Brittle, DO Psychiatry Resident, PGY-2 08/25/2022, 7:09 PM   [facility] [address] [place], [place] [address] Dept: [phone] Dept Fax: [phone]

## 2022-08-25 NOTE — ED Notes (Signed)
Pt is in the bed sleeping. Respirations are even and unlabored. No acute distress noted. Will continue to monitor for safety. 

## 2022-08-25 NOTE — ED Notes (Signed)
Pt asleep in bed. Respirations even and unlabored. Monitoring for safety. 

## 2022-08-25 NOTE — ED Notes (Signed)
Pt sitting in dining room watching TV. A&O x4, calm and cooperative. Denies current SI/HI/AVH. No signs of distress noted. Monitoring for safety.  

## 2022-08-26 DIAGNOSIS — Z20822 Contact with and (suspected) exposure to covid-19: Secondary | ICD-10-CM | POA: Diagnosis not present

## 2022-08-26 DIAGNOSIS — F1910 Other psychoactive substance abuse, uncomplicated: Secondary | ICD-10-CM | POA: Diagnosis not present

## 2022-08-26 DIAGNOSIS — I10 Essential (primary) hypertension: Secondary | ICD-10-CM | POA: Diagnosis not present

## 2022-08-26 DIAGNOSIS — H409 Unspecified glaucoma: Secondary | ICD-10-CM | POA: Diagnosis not present

## 2022-08-26 MED ORDER — CLONIDINE HCL 0.1 MG PO TABS: 0.1000 mg | ORAL_TABLET | Freq: Once | ORAL | Status: DC

## 2022-08-26 MED ORDER — LISINOPRIL 20 MG PO TABS
40.0000 mg | ORAL_TABLET | Freq: Every day | ORAL | Status: DC
  Administered 2022-08-26 – 2022-08-30 (×5): 40 mg via ORAL
  Filled 2022-08-26 (×2): qty 2
  Filled 2022-08-26: qty 28
  Filled 2022-08-26 (×2): qty 2
  Filled 2022-08-26 (×2): qty 28
  Filled 2022-08-26: qty 2

## 2022-08-26 NOTE — ED Provider Notes (Signed)
Behavioral Health Progress Note  Date and Time: 08/26/2022 1:32 PM Name: Brady Church MRN:  [id number]  Subjective:  Brady Church 61 y.o., male patient who initially presented to [facility] on 08/23/2022 requesting alcohol and cocaine detox.  He was admitted to the facility based crisis unit.  Brady Church, 61 y.o., male patient seen face to face by this provider, consulted with Dr. Dwyane Dee; and chart reviewed on 08/26/22.  Per chart review patient has a past psychiatric history of AUD, cocaine use disorder, and cannabis use disorder.   UDS on admission positive for cocaine and marijuana.  EtOH is negative.  During evaluation Brady Church is observed sitting in the day room watching TV in no acute distress.  He is alert/oriented x4, cooperative, attentive.  He is fairly groomed and makes minimal eye contact.  Patient is legally blind.  He walks slowly as not to bump into things.  He has normal speech and behavior.  He denies depression and anxiety and overall states his mood is good.  He denies any concerns with appetite.  Reports he was able to sleep over 8 hours last night.  He denies Church/HI/AVH.  Objectively he does not appear to be responding to internal/external stimuli.  Patient is able to answer questions appropriately.   He continues to tolerate naltrexone and Zoloft without any adverse reactions.  He denies any alcohol withdrawal symptoms.  He continues to seek residential substance abuse treatment.  Social work will be notified to seek residential placement.  Diagnosis:  Final diagnoses:  Polysubstance abuse ([place])    Total Time spent with patient: 30 minutes  Past Psychiatric History: see h&P Past Medical History:  Past Medical History:  Diagnosis Date   Alcohol use disorder, severe, dependence ([facility]) 11/06/2014   Blind    blind in left eye, very limited vision right eye    Cannabis use disorder 08/24/2022   Cocaine use disorder, severe, dependence ([place])    Diabetes  mellitus    High cholesterol    Housing instability 08/24/2022   Hypertension    Substance abuse ([place])    cocaine. Stopped in 2010   Tobacco use disorder 10/07/2014    Past Surgical History:  Procedure Laterality Date   CATARACT EXTRACTION  01/08/2012   Right eye   EYE SURGERY     x 4 total    Family History:  Family History  Problem Relation Age of Onset   Coronary artery disease Mother 30       died of MI   Cancer Father 19       Some GI cancer. Not sure if it was Colon Cancer or not.   Hypertension Father    Diabetes Maternal Grandmother    Family Psychiatric  History: see H&P Social History:  Social History   Substance and Sexual Activity  Alcohol Use Yes   Comment: Beer- 40 oz. every day.     Social History   Substance and Sexual Activity  Drug Use Not Currently   Types: Cocaine, Marijuana   Comment: Sometimes.    Social History   Socioeconomic History   Marital status: Single    Spouse name: Not on file   Number of children: Not on file   Years of education: 12   Highest education level: Not on file  Occupational History   Not on file  Tobacco Use   Smoking status: Former    Packs/day: 0.50    Types: Cigarettes    Quit date: 02/18/2017  Years since quitting: 5.5   Smokeless tobacco: Never  Substance and Sexual Activity   Alcohol use: Yes    Comment: Beer- 40 oz. every day.   Drug use: Not Currently    Types: Cocaine, Marijuana    Comment: Sometimes.   Sexual activity: Not on file  Other Topics Concern   Not on file  Social History Narrative   Lives in [place] for last 10 years with "friend"  [name]      Is not married and does not have kids.   Working sporadically as a Horticulturist, commercial.    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   Social Determinants of Health   Financial Resource Strain: Not on file  Food Insecurity: Not on file  Transportation Needs: Not on file  Physical Activity: Not on file  Stress:  Not on file  Social Connections: Not on file   SDOH:  SDOH Screenings   Depression (PHQ2-9): Medium Risk (08/23/2022)  Tobacco Use: Medium Risk (08/24/2022)   Additional Social History:    Pain Medications: See MAR Prescriptions: See MAR Over the Counter: See MAR History of alcohol / drug use?: Yes Longest period of sobriety (when/how long): denies sobriety periods Negative Consequences of Use: Financial, Personal relationships Withdrawal Symptoms: None Name of Substance 1: ETOH 1 - Age of First Use: 20s 1 - Amount (size/oz): 2-40 oz beers 1 - Frequency: daily 1 - Duration: "years" 1 - Last Use / Amount: last night- 2-40s 1 - Method of Aquiring: NA 1- Route of Use: drinks Name of Substance 2: Cocaine 2 - Age of First Use: 20s 2 - Amount (size/oz): unknown 2 - Frequency: 3-4 x per week or "when I can get it" 2 - Duration: "years" 2 - Last Use / Amount: last night 2 - Method of Aquiring: NA 2 - Route of Substance Use: smokes                Sleep: Good  Appetite:  Good  Current Medications:  Current Facility-Administered Medications  Medication Dose Route Frequency Provider Last Rate Last Admin   acetaminophen (TYLENOL) tablet 650 mg  650 mg Oral Q6H PRN Tharon Aquas, NP       alum & mag hydroxide-simeth (MAALOX/MYLANTA) 200-200-20 MG/5ML suspension 30 mL  30 mL Oral Q4H PRN Tharon Aquas, NP       atorvastatin (LIPITOR) tablet 80 mg  80 mg Oral q1800 Tharon Aquas, NP   80 mg at 08/25/22 1701   brimonidine (ALPHAGAN) 0.15 % ophthalmic solution 1 drop  1 drop Both Eyes BID Tharon Aquas, NP   1 drop at 08/26/22 0935   dorzolamide-timolol (COSOPT) 22.3-6.8 MG/ML ophthalmic solution 1 drop  1 drop Right Eye BID Tharon Aquas, NP   1 drop at 08/26/22 0935   fluorometholone (FML) 0.1 % ophthalmic suspension 1 drop  1 drop Both Eyes Daily Hampton Abbot, MD   1 drop at 08/26/22 0935   gabapentin (NEURONTIN) capsule 300 mg  300 mg Oral TID  Tharon Aquas, NP   300 mg at 08/26/22 0914   hydrochlorothiazide (HYDRODIURIL) tablet 50 mg  50 mg Oral Daily Merrily Brittle, DO   50 mg at 08/26/22 0914   lisinopril (ZESTRIL) tablet 40 mg  40 mg Oral Daily Onuoha, Chinwendu V, NP   40 mg at 08/26/22 0434   magnesium hydroxide (MILK OF MAGNESIA) suspension 30 mL  30 mL Oral Daily PRN Elvin So  Jasper Riling, NP       metFORMIN (GLUCOPHAGE) tablet 500 mg  500 mg Oral BID WC Tharon Aquas, NP   500 mg at 08/26/22 1833   multivitamin with minerals tablet 1 tablet  1 tablet Oral Daily Tharon Aquas, NP   1 tablet at 08/26/22 0914   naltrexone (DEPADE) tablet 50 mg  50 mg Oral QHS Merrily Brittle, DO   50 mg at 08/25/22 2124   Netarsudil-Latanoprost 0.02-0.005 % SOLN 1 drop  1 drop Right Eye QHS Tharon Aquas, NP   1 drop at 08/25/22 2130   sertraline (ZOLOFT) tablet 50 mg  50 mg Oral Daily Merrily Brittle, DO   50 mg at 08/26/22 5825   thiamine (VITAMIN B1) tablet 100 mg  100 mg Oral Daily Tharon Aquas, NP   100 mg at 08/26/22 1898   Current Outpatient Medications  Medication Sig Dispense Refill   ALPHAGAN P 0.1 % SOLN INSTILL 1 DROP INTO EACH EYE TWICE DAILY (Patient taking differently: Place 1 drop into both eyes in the morning and at bedtime.) 5 mL 5   aspirin 325 MG tablet Take 325 mg by mouth daily.     atorvastatin (LIPITOR) 80 MG tablet TAKE 1 TABLET BY MOUTH ONCE DAILY AT  6  PM (Patient taking differently: Take 80 mg by mouth daily at 6 PM.) 90 tablet 3   dorzolamide-timolol (COSOPT) 22.3-6.8 MG/ML ophthalmic solution Place 1 drop into the right eye 2 (two) times daily. 10 mL 5   fluorometholone (FML) 0.1 % ophthalmic suspension INSTILL 1 DROP INTO EACH EYE ONCE DAILY (Patient taking differently: Place 1 drop into both eyes daily.) 5 mL 5   gabapentin (NEURONTIN) 300 MG capsule Take 1 capsule (300 mg total) by mouth 3 (three) times daily. 90 capsule 11   hydrochlorothiazide (HYDRODIURIL) 25 MG tablet Take 1 tablet (25  mg total) by mouth daily. 90 tablet 0   lisinopril (ZESTRIL) 40 MG tablet Take 1 tablet (40 mg total) by mouth daily. 30 tablet 5   metFORMIN (GLUCOPHAGE) 500 MG tablet Take 1 tablet (500 mg total) by mouth 2 (two) times daily with a meal. 180 tablet 0   ROCKLATAN 0.02-0.005 % SOLN Place 1 drop into the right eye at bedtime.     sildenafil (REVATIO) 20 MG tablet TAKE 2 TO 5 TABLETS BY MOUTH AS NEEDED FOR 30 MINUTES PRIOR TO SEXUAL ACTIVITY (Patient taking differently: Take 40-100 mg by mouth daily as needed (Take 30 minutes prior to sexual activity).) 20 tablet 2    Labs  Lab Results:  Admission on 08/23/2022  Component Date Value Ref Range Status   SARS Coronavirus 2 by RT PCR 08/23/2022 NEGATIVE  NEGATIVE Final   Comment: (NOTE) SARS-CoV-2 target nucleic acids are NOT DETECTED.  The SARS-CoV-2 RNA is generally detectable in upper respiratory specimens during the acute phase of infection. The lowest concentration of SARS-CoV-2 viral copies this assay can detect is 138 copies/mL. A negative result does not preclude SARS-Cov-2 infection and should not be used as the sole basis for treatment or other patient management decisions. A negative result may occur with  improper specimen collection/handling, submission of specimen other than nasopharyngeal swab, presence of viral mutation(s) within the areas targeted by this assay, and inadequate number of viral copies(<138 copies/mL). A negative result must be combined with clinical observations, patient history, and epidemiological information. The expected result is Negative.  Fact Sheet for Patients:  EntrepreneurPulse.com.au  Fact Sheet for Healthcare Providers:  IncredibleEmployment.be  This test is no                          t yet approved or cleared by the [place] and  has been authorized for detection and/or diagnosis of SARS-CoV-2 by FDA under an Emergency Use Authorization (EUA).  This EUA will remain  in effect (meaning this test can be used) for the duration of the COVID-19 declaration under Section 564(b)(1) of the Act, 21 U.S.C.section 360bbb-3(b)(1), unless the authorization is terminated  or revoked sooner.       Influenza A by PCR 08/23/2022 NEGATIVE  NEGATIVE Final   Influenza B by PCR 08/23/2022 NEGATIVE  NEGATIVE Final   Comment: (NOTE) The Xpert Xpress SARS-CoV-2/FLU/RSV plus assay is intended as an aid in the diagnosis of influenza from Nasopharyngeal swab specimens and should not be used as a sole basis for treatment. Nasal washings and aspirates are unacceptable for Xpert Xpress SARS-CoV-2/FLU/RSV testing.  Fact Sheet for Patients: EntrepreneurPulse.com.au  Fact Sheet for Healthcare Providers: IncredibleEmployment.be  This test is not yet approved or cleared by the [place] FDA and has been authorized for detection and/or diagnosis of SARS-CoV-2 by FDA under an Emergency Use Authorization (EUA). This EUA will remain in effect (meaning this test can be used) for the duration of the COVID-19 declaration under Section 564(b)(1) of the Act, 21 U.S.C. section 360bbb-3(b)(1), unless the authorization is terminated or revoked.  Performed at [facility], [place] [address], [place], [place] [address]    WBC 08/23/2022 5.9  4.0 - 10.5 K/uL Final   RBC 08/23/2022 4.16 (L)  4.22 - 5.81 MIL/uL Final   Hemoglobin 08/23/2022 13.7  13.0 - 17.0 g/dL Final   HCT 08/23/2022 37.9 (L)  39.0 - 52.0 % Final   MCV 08/23/2022 91.1  80.0 - 100.0 fL Final   MCH 08/23/2022 32.9  26.0 - 34.0 pg Final   MCHC 08/23/2022 36.1 (H)  30.0 - 36.0 g/dL Final   RDW 08/23/2022 14.0  11.5 - 15.5 % Final   Platelets 08/23/2022 PLATELET CLUMPS NOTED ON SMEAR, UNABLE TO ESTIMATE  150 - 400 K/uL Final   REPEATED TO VERIFY   nRBC 08/23/2022 0.0  0.0 - 0.2 % Final   Neutrophils Relative % 08/23/2022 58  % Final   Neutro Abs 08/23/2022  3.5  1.7 - 7.7 K/uL Final   Lymphocytes Relative 08/23/2022 33  % Final   Lymphs Abs 08/23/2022 1.9  0.7 - 4.0 K/uL Final   Monocytes Relative 08/23/2022 6  % Final   Monocytes Absolute 08/23/2022 0.3  0.1 - 1.0 K/uL Final   Eosinophils Relative 08/23/2022 2  % Final   Eosinophils Absolute 08/23/2022 0.1  0.0 - 0.5 K/uL Final   Basophils Relative 08/23/2022 1  % Final   Basophils Absolute 08/23/2022 0.0  0.0 - 0.1 K/uL Final   Immature Granulocytes 08/23/2022 0  % Final   Abs Immature Granulocytes 08/23/2022 0.02  0.00 - 0.07 K/uL Final   Performed at [facility], [place] [address], [place], [place] [address]   Sodium 08/23/2022 139  135 - 145 mmol/L Final   Potassium 08/23/2022 3.5  3.5 - 5.1 mmol/L Final   Chloride 08/23/2022 101  98 - 111 mmol/L Final   CO2 08/23/2022 31  22 - 32 mmol/L Final   Glucose, Bld 08/23/2022 204 (H)  70 - 99 mg/dL Final   Glucose reference range applies only  to samples taken after fasting for at least 8 hours.   BUN 08/23/2022 16  6 - 20 mg/dL Final   Creatinine, Ser 08/23/2022 0.95  0.61 - 1.24 mg/dL Final   Calcium 08/23/2022 9.5  8.9 - 10.3 mg/dL Final   Total Protein 08/23/2022 6.9  6.5 - 8.1 g/dL Final   Albumin 08/23/2022 3.9  3.5 - 5.0 g/dL Final   AST 08/23/2022 24  15 - 41 U/L Final   ALT 08/23/2022 22  0 - 44 U/L Final   Alkaline Phosphatase 08/23/2022 57  38 - 126 U/L Final   Total Bilirubin 08/23/2022 0.6  0.3 - 1.2 mg/dL Final   GFR, Estimated 08/23/2022 >60  >60 mL/min Final   Comment: (NOTE) Calculated using the CKD-EPI Creatinine Equation (2021)    Anion gap 08/23/2022 7  5 - 15 Final   Performed at [facility] [address] [place], [place] [address]   Alcohol, Ethyl (B) 08/23/2022 <10  <10 mg/dL Final   Comment: (NOTE) Lowest detectable limit for serum alcohol is 10 mg/dL.  For medical purposes only. Performed at [facility], [place] [address], [place], [place] [address]    POC Amphetamine UR 08/23/2022 None  Detected  NONE DETECTED (Cut Off Level 1000 ng/mL) Final   POC Secobarbital (BAR) 08/23/2022 None Detected  NONE DETECTED (Cut Off Level 300 ng/mL) Final   POC Buprenorphine (BUP) 08/23/2022 None Detected  NONE DETECTED (Cut Off Level 10 ng/mL) Final   POC Oxazepam (BZO) 08/23/2022 None Detected  NONE DETECTED (Cut Off Level 300 ng/mL) Final   POC Cocaine UR 08/23/2022 Positive (A)  NONE DETECTED (Cut Off Level 300 ng/mL) Final   POC Methamphetamine UR 08/23/2022 None Detected  NONE DETECTED (Cut Off Level 1000 ng/mL) Final   POC Morphine 08/23/2022 None Detected  NONE DETECTED (Cut Off Level 300 ng/mL) Final   POC Methadone UR 08/23/2022 None Detected  NONE DETECTED (Cut Off Level 300 ng/mL) Final   POC Oxycodone UR 08/23/2022 None Detected  NONE DETECTED (Cut Off Level 100 ng/mL) Final   POC Marijuana UR 08/23/2022 Positive (A)  NONE DETECTED (Cut Off Level 50 ng/mL) Final   SARSCOV2ONAVIRUS 2 AG 08/23/2022 NEGATIVE  NEGATIVE Final   Comment: (NOTE) SARS-CoV-2 antigen NOT DETECTED.   Negative results are presumptive.  Negative results do not preclude SARS-CoV-2 infection and should not be used as the sole basis for treatment or other patient management decisions, including infection  control decisions, particularly in the presence of clinical signs and  symptoms consistent with COVID-19, or in those who have been in contact with the virus.  Negative results must be combined with clinical observations, patient history, and epidemiological information. The expected result is Negative.  Fact Sheet for Patients: HandmadeRecipes.com.cy  Fact Sheet for Healthcare Providers: FuneralLife.at  This test is not yet approved or cleared by the [place] FDA and  has been authorized for detection and/or diagnosis of SARS-CoV-2 by FDA under an Emergency Use Authorization (EUA).  This EUA will remain in effect (meaning this test can be used) for the  duration of  the COV                          ID-19 declaration under Section 564(b)(1) of the Act, 21 U.S.C. section 360bbb-3(b)(1), unless the authorization is terminated or revoked sooner.     Cholesterol 08/23/2022 220 (H)  0 - 200 mg/dL Final   Triglycerides 08/23/2022  112  <150 mg/dL Final   HDL 08/23/2022 61  >40 mg/dL Final   Total CHOL/HDL Ratio 08/23/2022 3.6  RATIO Final   VLDL 08/23/2022 22  0 - 40 mg/dL Final   LDL Cholesterol 08/23/2022 137 (H)  0 - 99 mg/dL Final   Comment:        Total Cholesterol/HDL:CHD Risk Coronary Heart Disease Risk Table                     Men   Women  1/2 Average Risk   3.4   3.3  Average Risk       5.0   4.4  2 X Average Risk   9.6   7.1  3 X Average Risk  23.4   11.0        Use the calculated Patient Ratio above and the CHD Risk Table to determine the patient's CHD Risk.        ATP III CLASSIFICATION (LDL):  <100     mg/dL   Optimal  100-129  mg/dL   Near or Above                    Optimal  130-159  mg/dL   Borderline  160-189  mg/dL   High  >190     mg/dL   Very High Performed at [facility] [address] [place], [place] [address]    TSH 08/23/2022 1.406  0.350 - 4.500 uIU/mL Final   Comment: Performed by a 3rd Generation assay with a functional sensitivity of <=0.01 uIU/mL. Performed at [facility], [place] [address], [place], [place] [address]   Congregational Nurse Program on 08/09/2022  Component Date Value Ref Range Status   POC Glucose 08/09/2022 253 (A)  70 - 99 mg/dl Final   non fasting  Congregational Nurse Program on 07/26/2022  Component Date Value Ref Range Status   POC Glucose 07/26/2022 175 (A)  70 - 99 mg/dl Final   non fasting  Congregational Nurse Program on 07/21/2022  Component Date Value Ref Range Status   POC Glucose 07/21/2022 302 (A)  70 - 99 mg/dl Final   non fasting  Congregational Nurse Program on 07/12/2022  Component Date Value Ref Range Status   POC Glucose 07/12/2022 225 (A)   70 - 99 mg/dl Final   non fasting  Congregational Nurse Program on 07/07/2022  Component Date Value Ref Range Status   POC Glucose 07/07/2022 274 (A)  70 - 99 mg/dl Final   non fasting  Office Visit on 07/05/2022  Component Date Value Ref Range Status   Hemoglobin A1C 07/05/2022 8.0 (A)  4.0 - 5.6 % Final   Glucose-Capillary 07/05/2022 183 (H)  70 - 99 mg/dL Final   Glucose reference range applies only to samples taken after fasting for at least 8 hours.   Glucose 07/05/2022 175 (H)  70 - 99 mg/dL Final   BUN 07/05/2022 16  6 - 24 mg/dL Final   Creatinine, Ser 07/05/2022 1.04  0.76 - 1.27 mg/dL Final   eGFR 07/05/2022 83  >59 mL/min/1.73 Final   BUN/Creatinine Ratio 07/05/2022 15  9 - 20 Final   Sodium 07/05/2022 142  134 - 144 mmol/L Final   Potassium 07/05/2022 4.1  3.5 - 5.2 mmol/L Final   Chloride 07/05/2022 103  96 - 106 mmol/L Final   CO2 07/05/2022 22  20 - 29 mmol/L Final   Anion Gap 07/05/2022 17.0  10.0 -  18.0 mmol/L Final   Calcium 07/05/2022 9.7  8.7 - 10.2 mg/dL Final   Total Protein 07/05/2022 7.3  6.0 - 8.5 g/dL Final   Albumin 07/05/2022 4.4  3.8 - 4.9 g/dL Final   Globulin, Total 07/05/2022 2.9  1.5 - 4.5 g/dL Final   Albumin/Globulin Ratio 07/05/2022 1.5  1.2 - 2.2 Final   Bilirubin Total 07/05/2022 0.3  0.0 - 1.2 mg/dL Final   Alkaline Phosphatase 07/05/2022 69  44 - 121 IU/L Final   AST 07/05/2022 26  0 - 40 IU/L Final   ALT 07/05/2022 22  0 - 44 IU/L Final  Congregational Nurse Program on 06/21/2022  Component Date Value Ref Range Status   POC Glucose 06/21/2022 284 (A)  70 - 99 mg/dl Final   non fasting  Congregational Nurse Program on 06/19/2022  Component Date Value Ref Range Status   POC Glucose 06/19/2022 287 (A)  70 - 99 mg/dl Final   non fasting  Congregational Nurse Program on 06/09/2022  Component Date Value Ref Range Status   POC Glucose 06/09/2022 220 (A)  70 - 99 mg/dl Final   non fasting  There may be more visits with results that are not  included.    Blood Alcohol level:  Lab Results  Component Value Date   ETH <10 08/23/2022   ETH 12 (H) 84/13/2440    Metabolic Disorder Labs: Lab Results  Component Value Date   HGBA1C 8.0 (A) 07/05/2022   MPG 367 02/17/2017   MPG 258 04/19/2015   No results found for: "PROLACTIN" Lab Results  Component Value Date   CHOL 220 (H) 08/23/2022   TRIG 112 08/23/2022   HDL 61 08/23/2022   CHOLHDL 3.6 08/23/2022   VLDL 22 08/23/2022   LDLCALC 137 (H) 08/23/2022   LDLCALC 128 (H) 10/12/2021    Therapeutic Lab Levels: No results found for: "LITHIUM" No results found for: "VALPROATE" No results found for: "CBMZ"  Physical Findings   PHQ2-9    [facility] ED from 08/23/2022 in [facility] Office Visit from 07/05/2022 in [facility] Office Visit from 04/12/2022 in [facility] Office Visit from 01/11/2022 in [facility] Office Visit from 10/12/2021 in [facility]  PHQ-2 Total Score 2 0 0 0 0  PHQ-9 Total Score 6 -- -- -- --      [facility] ED from 08/23/2022 in [facility] ED from 10/10/2021 in [facility] ED from 06/27/2021 in [facility] No Risk No Risk No Risk        Musculoskeletal  Strength & Muscle Tone: within normal limits Gait & Station: normal Patient leans: [name]  Psychiatric Specialty Exam  Presentation  General Appearance:  Appropriate for Environment; Casual  Eye Contact: Good  Speech: Clear and Coherent; Normal Rate  Speech Volume: Normal  Handedness: Right   Mood and Affect  Mood: Euthymic  Affect: Congruent; Appropriate   Thought Process  Thought Processes: Coherent  Descriptions of Associations:Intact  Orientation:Full (Time, Place and Person)  Thought Content:Logical  Diagnosis  of Schizophrenia or Schizoaffective disorder in past: No    Hallucinations:Hallucinations: None  Ideas of Reference:None  Suicidal Thoughts:Suicidal Thoughts: No  Homicidal Thoughts:Homicidal Thoughts: No   Sensorium  Memory: Immediate Good; Recent Good; Remote Good  Judgment: Good  Insight: Good   Executive Functions  Concentration: Good  Attention Span: Good  Recall: Good  Fund of Knowledge: Good  Language: Good   Psychomotor Activity  Psychomotor Activity: Psychomotor Activity: Normal   Assets  Assets: Communication Skills; Desire for Improvement; Leisure Time; Physical Health; Resilience   Sleep  Sleep: Sleep: Good Number of Hours of Sleep: 8   No data recorded  Physical Exam  Physical Exam Vitals and nursing note reviewed.  Constitutional:      General: He is not in acute distress.    Appearance: Normal appearance. He is well-developed.  HENT:     Head: Normocephalic.  Eyes:     General:        Right eye: No discharge.        Left eye: No discharge.  Cardiovascular:     Rate and Rhythm: Normal rate.  Pulmonary:     Effort: Pulmonary effort is normal. No respiratory distress.  Abdominal:     Tenderness: There is no abdominal tenderness.  Musculoskeletal:        General: Normal range of motion.     Cervical back: Normal range of motion.  Skin:    Coloration: Skin is not jaundiced or pale.  Neurological:     Mental Status: He is alert and oriented to person, place, and time.  Psychiatric:        Attention and Perception: Attention and perception normal.        Mood and Affect: Mood normal.        Speech: Speech normal.        Behavior: Behavior normal. Behavior is cooperative.        Thought Content: Thought content normal.        Cognition and Memory: Cognition normal.        Judgment: Judgment normal.    Review of Systems  Constitutional: Negative.   HENT: Negative.    Eyes: Negative.   Respiratory: Negative.     Cardiovascular: Negative.   Musculoskeletal: Negative.   Skin: Negative.   Neurological: Negative.   Psychiatric/Behavioral:  Positive for substance abuse.    Blood pressure (!) 143/96, pulse 74, temperature 98.5 F (36.9 C), temperature source Oral, resp. rate 16, SpO2 96 %. There is no height or weight on file to calculate BMI.  Treatment Plan Summary: Disposition: Patient continues to meet criteria for treatment FBC.  Will continue to have Daily contact with patient to assess and evaluate symptoms and progress in treatment and Medication management.   Will notify social worker that patient is seeking residential substance abuse treatment.  Revonda Humphrey, NP 08/26/2022 1:32 PM

## 2022-08-26 NOTE — Progress Notes (Signed)
Patient has remained awake and alert sitting in the day room with peers.  He is watching a movie at this time.  Patient is calm, pleasant, organized and logical.  Denies avh shi or plan.  No evidence of withdrawal at this time.  Will monitor and provide support as needed.

## 2022-08-26 NOTE — ED Notes (Signed)
Pt is attending AA. 

## 2022-08-26 NOTE — ED Notes (Signed)
Patient awake early this morning.  He took a shower and ate breakfast.  He is now sitting in dayroom wathcing the morning news.  Patient without withdrawal s/s or somatic complaint.  Patient is calm and pleasant on approach.  Encouraged him to make needs known.  Will monitor.

## 2022-08-26 NOTE — ED Notes (Signed)
Pt sitting in dining room watching TV. A&O x4, calm and cooperative. Denies current SI/HI/AVH. No signs of distress noted. Monitoring for safety.  

## 2022-08-26 NOTE — ED Notes (Signed)
Snacks given 

## 2022-08-26 NOTE — ED Notes (Signed)
Pt asleep in bed. Respirations even and unlabored. Monitoring for safety. 

## 2022-08-27 DIAGNOSIS — Z20822 Contact with and (suspected) exposure to covid-19: Secondary | ICD-10-CM | POA: Diagnosis not present

## 2022-08-27 DIAGNOSIS — F1910 Other psychoactive substance abuse, uncomplicated: Secondary | ICD-10-CM | POA: Diagnosis not present

## 2022-08-27 DIAGNOSIS — H409 Unspecified glaucoma: Secondary | ICD-10-CM | POA: Diagnosis not present

## 2022-08-27 DIAGNOSIS — I10 Essential (primary) hypertension: Secondary | ICD-10-CM | POA: Diagnosis not present

## 2022-08-27 NOTE — ED Notes (Signed)
Pt resting in bed watching TV. A&O x4, calm and cooperative. Denies current SI/HI/AVH. No signs of distress noted. Monitoring for safety.

## 2022-08-27 NOTE — ED Provider Notes (Signed)
Behavioral Health Progress Note  Date and Time: 08/27/2022 5:35 PM Name: Brady Church MRN:  [id number]   Subjective:  Brady Church 61 y.o., male patient who initially presented to [facility] on 08/23/2022 requesting alcohol and cocaine detox.  He was admitted to the facility based crisis unit.   Brady Church, 61 y.o., male patient seen face to face by this provider, consulted with Dr. Dwyane Dee; and chart reviewed on 08/27/22.  Per chart review patient has a past psychiatric history of AUD, cocaine use disorder, and cannabis use disorder.    UDS on admission positive for cocaine and marijuana.  EtOH is negative  On today's reevaluation [name] is observed laying in his bed.  He is awake, alert/oriented x4, calm, cooperative, and attentive.  States he just finished eating lunch and needed to take a nap.  He has normal speech and behavior.  He continues to deny depression and anxiety.  He denies any concerns with appetite or sleep.  He denies Church/HI/AVH.  He is able to converse coherently with no distractibility or internal preoccupation.  He continues to deny any withdrawal symptoms or health concerns.  He is tolerating all medications without any adverse reactions.  He is adamant that he would like to be referred to [facility] residential for substance abuse treatment.  Discussed with patient that social work would be notified.  He has participated/attended group sessions while on the unit.  He is compliant with medications.  He has been appropriate with staff and other patients.  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 Stopped in 2010   Tobacco use disorder 10/07/2014    Past Surgical History:  Procedure Laterality Date   CATARACT EXTRACTION  01/08/2012   Right eye   EYE SURGERY     x 4 total    Family History:  Family History  Problem Relation Age of Onset   Coronary artery disease Mother 11       died of MI   Cancer Father 44       Some GI cancer. Not sure if it was Colon Cancer or not.   Hypertension Father    Diabetes Maternal Grandmother    Family Psychiatric  History: See H&P Social History:  Social History   Substance and Sexual Activity  Alcohol Use Yes   Comment: Beer- 40 oz. every day.     Social History   Substance and Sexual Activity  Drug Use Not Currently   Types: Cocaine, Marijuana   Comment: Sometimes.    Social History   Socioeconomic History   Marital status: Single    Spouse name: Not on file   Number of children: Not on file   Years of education: 12   Highest education level: Not on file  Occupational History   Not on file  Tobacco Use   Smoking status: Former    Packs/day: 0.50    Types: Cigarettes    Quit date:  02/18/2017    Years since quitting: 5.5   Smokeless tobacco: Never  Substance and Sexual Activity   Alcohol use: Yes    Comment: Beer- 40 oz. every day.   Drug use: Not Currently    Types: Cocaine, Marijuana    Comment: Sometimes.   Sexual activity: Not on file  Other Topics Concern   Not on file  Social History Narrative   Lives in [place] for last 10 years with "friend"  [name]      Is not married and does not have kids.   Working sporadically as a Horticulturist, commercial.    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   Social Determinants of Health   Financial Resource Strain: Not on file  Food Insecurity: Not on file  Transportation Needs: Not on file  Physical Activity: Not on file   Stress: Not on file  Social Connections: Not on file   SDOH:  SDOH Screenings   Depression (PHQ2-9): Low Risk  (08/25/2022)  Recent Concern: Depression (PHQ2-9) - Medium Risk (08/23/2022)  Tobacco Use: Medium Risk (08/24/2022)   Additional Social History:    Pain Medications: See MAR Prescriptions: See MAR Over the Counter: See MAR History of alcohol / drug use?: Yes Longest period of sobriety (when/how long): denies sobriety periods Negative Consequences of Use: Financial, Personal relationships Withdrawal Symptoms: None Name of Substance 1: ETOH 1 - Age of First Use: 20s 1 - Amount (size/oz): 2-40 oz beers 1 - Frequency: daily 1 - Duration: "years" 1 - Last Use / Amount: last night- 2-40s 1 - Method of Aquiring: NA 1- Route of Use: drinks Name of Substance 2: Cocaine 2 - Age of First Use: 20s 2 - Amount (size/oz): unknown 2 - Frequency: 3-4 x per week or "when I can get it" 2 - Duration: "years" 2 - Last Use / Amount: last night 2 - Method of Aquiring: NA 2 - Route of Substance Use: smokes                Sleep: Good  Appetite:  Good  Current Medications:  Current Facility-Administered Medications  Medication Dose Route Frequency Provider Last Rate Last Admin   acetaminophen (TYLENOL) tablet 650 mg  650 mg Oral Q6H PRN Tharon Aquas, NP       alum & mag hydroxide-simeth (MAALOX/MYLANTA) 200-200-20 MG/5ML suspension 30 mL  30 mL Oral Q4H PRN Tharon Aquas, NP       atorvastatin (LIPITOR) tablet 80 mg  80 mg Oral q1800 Tharon Aquas, NP   80 mg at 08/26/22 1710   brimonidine (ALPHAGAN) 0.15 % ophthalmic solution 1 drop  1 drop Both Eyes BID Tharon Aquas, NP   1 drop at 08/27/22 0937   dorzolamide-timolol (COSOPT) 22.3-6.8 MG/ML ophthalmic solution 1 drop  1 drop Right Eye BID Tharon Aquas, NP   1 drop at 08/27/22 0937   fluorometholone (FML) 0.1 % ophthalmic suspension 1 drop  1 drop Both Eyes Daily Hampton Abbot, MD   1 drop at  08/27/22 0937   gabapentin (NEURONTIN) capsule 300 mg  300 mg Oral TID Tharon Aquas, NP   300 mg at 08/27/22 1628   hydrochlorothiazide (HYDRODIURIL) tablet 50 mg  50 mg Oral Daily Merrily Brittle, DO   50 mg at 08/27/22 0936   lisinopril (ZESTRIL) tablet 40 mg  40 mg Oral Daily Onuoha, Chinwendu V, NP   40 mg at 08/27/22 0936   magnesium hydroxide (  MILK OF MAGNESIA) suspension 30 mL  30 mL Oral Daily PRN Tharon Aquas, NP       metFORMIN (GLUCOPHAGE) tablet 500 mg  500 mg Oral BID WC Tharon Aquas, NP   500 mg at 08/27/22 1628   multivitamin with minerals tablet 1 tablet  1 tablet Oral Daily Tharon Aquas, NP   1 tablet at 08/27/22 0935   naltrexone (DEPADE) tablet 50 mg  50 mg Oral QHS Merrily Brittle, DO   50 mg at 08/26/22 2105   Netarsudil-Latanoprost 0.02-0.005 % SOLN 1 drop  1 drop Right Eye QHS Tharon Aquas, NP   1 drop at 08/26/22 2107   sertraline (ZOLOFT) tablet 50 mg  50 mg Oral Daily Merrily Brittle, DO   50 mg at 08/27/22 8264   thiamine (VITAMIN B1) tablet 100 mg  100 mg Oral Daily Tharon Aquas, NP   100 mg at 08/27/22 1583   Current Outpatient Medications  Medication Sig Dispense Refill   ALPHAGAN P 0.1 % SOLN INSTILL 1 DROP INTO EACH EYE TWICE DAILY (Patient taking differently: Place 1 drop into both eyes in the morning and at bedtime.) 5 mL 5   aspirin 325 MG tablet Take 325 mg by mouth daily.     atorvastatin (LIPITOR) 80 MG tablet TAKE 1 TABLET BY MOUTH ONCE DAILY AT  6  PM (Patient taking differently: Take 80 mg by mouth daily at 6 PM.) 90 tablet 3   dorzolamide-timolol (COSOPT) 22.3-6.8 MG/ML ophthalmic solution Place 1 drop into the right eye 2 (two) times daily. 10 mL 5   fluorometholone (FML) 0.1 % ophthalmic suspension INSTILL 1 DROP INTO EACH EYE ONCE DAILY (Patient taking differently: Place 1 drop into both eyes daily.) 5 mL 5   gabapentin (NEURONTIN) 300 MG capsule Take 1 capsule (300 mg total) by mouth 3 (three) times daily. 90 capsule  11   hydrochlorothiazide (HYDRODIURIL) 25 MG tablet Take 1 tablet (25 mg total) by mouth daily. 90 tablet 0   lisinopril (ZESTRIL) 40 MG tablet Take 1 tablet (40 mg total) by mouth daily. 30 tablet 5   metFORMIN (GLUCOPHAGE) 500 MG tablet Take 1 tablet (500 mg total) by mouth 2 (two) times daily with a meal. 180 tablet 0   ROCKLATAN 0.02-0.005 % SOLN Place 1 drop into the right eye at bedtime.     sildenafil (REVATIO) 20 MG tablet TAKE 2 TO 5 TABLETS BY MOUTH AS NEEDED FOR 30 MINUTES PRIOR TO SEXUAL ACTIVITY (Patient taking differently: Take 40-100 mg by mouth daily as needed (Take 30 minutes prior to sexual activity).) 20 tablet 2    Labs  Lab Results:  Admission on 08/23/2022  Component Date Value Ref Range Status   SARS Coronavirus 2 by RT PCR 08/23/2022 NEGATIVE  NEGATIVE Final   Comment: (NOTE) SARS-CoV-2 target nucleic acids are NOT DETECTED.  The SARS-CoV-2 RNA is generally detectable in upper respiratory specimens during the acute phase of infection. The lowest concentration of SARS-CoV-2 viral copies this assay can detect is 138 copies/mL. A negative result does not preclude SARS-Cov-2 infection and should not be used as the sole basis for treatment or other patient management decisions. A negative result may occur with  improper specimen collection/handling, submission of specimen other than nasopharyngeal swab, presence of viral mutation(s) within the areas targeted by this assay, and inadequate number of viral copies(<138 copies/mL). A negative result must be combined with clinical observations, patient history, and epidemiological information. The expected result is  Negative.  Fact Sheet for Patients:  EntrepreneurPulse.com.au  Fact Sheet for Healthcare Providers:  IncredibleEmployment.be  This test is no                          t yet approved or cleared by the [place] FDA and  has been authorized for detection and/or  diagnosis of SARS-CoV-2 by FDA under an Emergency Use Authorization (EUA). This EUA will remain  in effect (meaning this test can be used) for the duration of the COVID-19 declaration under Section 564(b)(1) of the Act, 21 U.S.C.section 360bbb-3(b)(1), unless the authorization is terminated  or revoked sooner.       Influenza A by PCR 08/23/2022 NEGATIVE  NEGATIVE Final   Influenza B by PCR 08/23/2022 NEGATIVE  NEGATIVE Final   Comment: (NOTE) The Xpert Xpress SARS-CoV-2/FLU/RSV plus assay is intended as an aid in the diagnosis of influenza from Nasopharyngeal swab specimens and should not be used as a sole basis for treatment. Nasal washings and aspirates are unacceptable for Xpert Xpress SARS-CoV-2/FLU/RSV testing.  Fact Sheet for Patients: EntrepreneurPulse.com.au  Fact Sheet for Healthcare Providers: IncredibleEmployment.be  This test is not yet approved or cleared by the [place] FDA and has been authorized for detection and/or diagnosis of SARS-CoV-2 by FDA under an Emergency Use Authorization (EUA). This EUA will remain in effect (meaning this test can be used) for the duration of the COVID-19 declaration under Section 564(b)(1) of the Act, 21 U.S.C. section 360bbb-3(b)(1), unless the authorization is terminated or revoked.  Performed at [facility], [place] [address], [place], [place] [address]    WBC 08/23/2022 5.9  4.0 - 10.5 K/uL Final   RBC 08/23/2022 4.16 (L)  4.22 - 5.81 MIL/uL Final   Hemoglobin 08/23/2022 13.7  13.0 - 17.0 g/dL Final   HCT 08/23/2022 37.9 (L)  39.0 - 52.0 % Final   MCV 08/23/2022 91.1  80.0 - 100.0 fL Final   MCH 08/23/2022 32.9  26.0 - 34.0 pg Final   MCHC 08/23/2022 36.1 (H)  30.0 - 36.0 g/dL Final   RDW 08/23/2022 14.0  11.5 - 15.5 % Final   Platelets 08/23/2022 PLATELET CLUMPS NOTED ON SMEAR, UNABLE TO ESTIMATE  150 - 400 K/uL Final   REPEATED TO VERIFY   nRBC 08/23/2022 0.0  0.0 - 0.2 %  Final   Neutrophils Relative % 08/23/2022 58  % Final   Neutro Abs 08/23/2022 3.5  1.7 - 7.7 K/uL Final   Lymphocytes Relative 08/23/2022 33  % Final   Lymphs Abs 08/23/2022 1.9  0.7 - 4.0 K/uL Final   Monocytes Relative 08/23/2022 6  % Final   Monocytes Absolute 08/23/2022 0.3  0.1 - 1.0 K/uL Final   Eosinophils Relative 08/23/2022 2  % Final   Eosinophils Absolute 08/23/2022 0.1  0.0 - 0.5 K/uL Final   Basophils Relative 08/23/2022 1  % Final   Basophils Absolute 08/23/2022 0.0  0.0 - 0.1 K/uL Final   Immature Granulocytes 08/23/2022 0  % Final   Abs Immature Granulocytes 08/23/2022 0.02  0.00 - 0.07 K/uL Final   Performed at [facility], [place] [address] [place], [place] [address]   Sodium 08/23/2022 139  135 - 145 mmol/L Final   Potassium 08/23/2022 3.5  3.5 - 5.1 mmol/L Final   Chloride 08/23/2022 101  98 - 111 mmol/L Final   CO2 08/23/2022 31  22 - 32 mmol/L Final   Glucose, Bld 08/23/2022  204 (H)  70 - 99 mg/dL Final   Glucose reference range applies only to samples taken after fasting for at least 8 hours.   BUN 08/23/2022 16  6 - 20 mg/dL Final   Creatinine, Ser 08/23/2022 0.95  0.61 - 1.24 mg/dL Final   Calcium 08/23/2022 9.5  8.9 - 10.3 mg/dL Final   Total Protein 08/23/2022 6.9  6.5 - 8.1 g/dL Final   Albumin 08/23/2022 3.9  3.5 - 5.0 g/dL Final   AST 08/23/2022 24  15 - 41 U/L Final   ALT 08/23/2022 22  0 - 44 U/L Final   Alkaline Phosphatase 08/23/2022 57  38 - 126 U/L Final   Total Bilirubin 08/23/2022 0.6  0.3 - 1.2 mg/dL Final   GFR, Estimated 08/23/2022 >60  >60 mL/min Final   Comment: (NOTE) Calculated using the CKD-EPI Creatinine Equation (2021)    Anion gap 08/23/2022 7  5 - 15 Final   Performed at [facility] [address], [place], [place] [address]   Alcohol, Ethyl (B) 08/23/2022 <10  <10 mg/dL Final   Comment: (NOTE) Lowest detectable limit for serum alcohol is 10 mg/dL.  For medical purposes only. Performed at [facility],  [place] [address] [place], [place] [address]    POC Amphetamine UR 08/23/2022 None Detected  NONE DETECTED (Cut Off Level 1000 ng/mL) Final   POC Secobarbital (BAR) 08/23/2022 None Detected  NONE DETECTED (Cut Off Level 300 ng/mL) Final   POC Buprenorphine (BUP) 08/23/2022 None Detected  NONE DETECTED (Cut Off Level 10 ng/mL) Final   POC Oxazepam (BZO) 08/23/2022 None Detected  NONE DETECTED (Cut Off Level 300 ng/mL) Final   POC Cocaine UR 08/23/2022 Positive (A)  NONE DETECTED (Cut Off Level 300 ng/mL) Final   POC Methamphetamine UR 08/23/2022 None Detected  NONE DETECTED (Cut Off Level 1000 ng/mL) Final   POC Morphine 08/23/2022 None Detected  NONE DETECTED (Cut Off Level 300 ng/mL) Final   POC Methadone UR 08/23/2022 None Detected  NONE DETECTED (Cut Off Level 300 ng/mL) Final   POC Oxycodone UR 08/23/2022 None Detected  NONE DETECTED (Cut Off Level 100 ng/mL) Final   POC Marijuana UR 08/23/2022 Positive (A)  NONE DETECTED (Cut Off Level 50 ng/mL) Final   SARSCOV2ONAVIRUS 2 AG 08/23/2022 NEGATIVE  NEGATIVE Final   Comment: (NOTE) SARS-CoV-2 antigen NOT DETECTED.   Negative results are presumptive.  Negative results do not preclude SARS-CoV-2 infection and should not be used as the sole basis for treatment or other patient management decisions, including infection  control decisions, particularly in the presence of clinical signs and  symptoms consistent with COVID-19, or in those who have been in contact with the virus.  Negative results must be combined with clinical observations, patient history, and epidemiological information. The expected result is Negative.  Fact Sheet for Patients: HandmadeRecipes.com.cy  Fact Sheet for Healthcare Providers: FuneralLife.at  This test is not yet approved or cleared by the [place] FDA and  has been authorized for detection and/or diagnosis of SARS-CoV-2 by FDA under an Emergency Use Authorization  (EUA).  This EUA will remain in effect (meaning this test can be used) for the duration of  the COV                          ID-19 declaration under Section 564(b)(1) of the Act, 21 U.S.C. section 360bbb-3(b)(1), unless the authorization is terminated or revoked sooner.  Cholesterol 08/23/2022 220 (H)  0 - 200 mg/dL Final   Triglycerides 08/23/2022 112  <150 mg/dL Final   HDL 08/23/2022 61  >40 mg/dL Final   Total CHOL/HDL Ratio 08/23/2022 3.6  RATIO Final   VLDL 08/23/2022 22  0 - 40 mg/dL Final   LDL Cholesterol 08/23/2022 137 (H)  0 - 99 mg/dL Final   Comment:        Total Cholesterol/HDL:CHD Risk Coronary Heart Disease Risk Table                     Men   Women  1/2 Average Risk   3.4   3.3  Average Risk       5.0   4.4  2 X Average Risk   9.6   7.1  3 X Average Risk  23.4   11.0        Use the calculated Patient Ratio above and the CHD Risk Table to determine the patient's CHD Risk.        ATP III CLASSIFICATION (LDL):  <100     mg/dL   Optimal  100-129  mg/dL   Near or Above                    Optimal  130-159  mg/dL   Borderline  160-189  mg/dL   High  >190     mg/dL   Very High Performed at [facility] [address] [place], [place] [address]    TSH 08/23/2022 1.406  0.350 - 4.500 uIU/mL Final   Comment: Performed by a 3rd Generation assay with a functional sensitivity of <=0.01 uIU/mL. Performed at [facility], [place] [address] [place], [place] [address]   Congregational Nurse Program on 08/09/2022  Component Date Value Ref Range Status   POC Glucose 08/09/2022 253 (A)  70 - 99 mg/dl Final   non fasting  Congregational Nurse Program on 07/26/2022  Component Date Value Ref Range Status   POC Glucose 07/26/2022 175 (A)  70 - 99 mg/dl Final   non fasting  Congregational Nurse Program on 07/21/2022  Component Date Value Ref Range Status   POC Glucose 07/21/2022 302 (A)  70 - 99 mg/dl Final   non fasting  Congregational Nurse Program on  07/12/2022  Component Date Value Ref Range Status   POC Glucose 07/12/2022 225 (A)  70 - 99 mg/dl Final   non fasting  Congregational Nurse Program on 07/07/2022  Component Date Value Ref Range Status   POC Glucose 07/07/2022 274 (A)  70 - 99 mg/dl Final   non fasting  Office Visit on 07/05/2022  Component Date Value Ref Range Status   Hemoglobin A1C 07/05/2022 8.0 (A)  4.0 - 5.6 % Final   Glucose-Capillary 07/05/2022 183 (H)  70 - 99 mg/dL Final   Glucose reference range applies only to samples taken after fasting for at least 8 hours.   Glucose 07/05/2022 175 (H)  70 - 99 mg/dL Final   BUN 07/05/2022 16  6 - 24 mg/dL Final   Creatinine, Ser 07/05/2022 1.04  0.76 - 1.27 mg/dL Final   eGFR 07/05/2022 83  >59 mL/min/1.73 Final   BUN/Creatinine Ratio 07/05/2022 15  9 - 20 Final   Sodium 07/05/2022 142  134 - 144 mmol/L Final   Potassium 07/05/2022 4.1  3.5 - 5.2 mmol/L Final   Chloride 07/05/2022 103  96 - 106 mmol/L Final   CO2 07/05/2022 22  20 - 29 mmol/L Final   Anion Gap 07/05/2022 17.0  10.0 - 18.0 mmol/L Final   Calcium 07/05/2022 9.7  8.7 - 10.2 mg/dL Final   Total Protein 07/05/2022 7.3  6.0 - 8.5 g/dL Final   Albumin 07/05/2022 4.4  3.8 - 4.9 g/dL Final   Globulin, Total 07/05/2022 2.9  1.5 - 4.5 g/dL Final   Albumin/Globulin Ratio 07/05/2022 1.5  1.2 - 2.2 Final   Bilirubin Total 07/05/2022 0.3  0.0 - 1.2 mg/dL Final   Alkaline Phosphatase 07/05/2022 69  44 - 121 IU/L Final   AST 07/05/2022 26  0 - 40 IU/L Final   ALT 07/05/2022 22  0 - 44 IU/L Final  Congregational Nurse Program on 06/21/2022  Component Date Value Ref Range Status   POC Glucose 06/21/2022 284 (A)  70 - 99 mg/dl Final   non fasting  Congregational Nurse Program on 06/19/2022  Component Date Value Ref Range Status   POC Glucose 06/19/2022 287 (A)  70 - 99 mg/dl Final   non fasting  Congregational Nurse Program on 06/09/2022  Component Date Value Ref Range Status   POC Glucose 06/09/2022 220 (A)  70 -  99 mg/dl Final   non fasting  There may be more visits with results that are not included.    Blood Alcohol level:  Lab Results  Component Value Date   ETH <10 08/23/2022   ETH 12 (H) 62/94/7654    Metabolic Disorder Labs: Lab Results  Component Value Date   HGBA1C 8.0 (A) 07/05/2022   MPG 367 02/17/2017   MPG 258 04/19/2015   No results found for: "PROLACTIN" Lab Results  Component Value Date   CHOL 220 (H) 08/23/2022   TRIG 112 08/23/2022   HDL 61 08/23/2022   CHOLHDL 3.6 08/23/2022   VLDL 22 08/23/2022   LDLCALC 137 (H) 08/23/2022   LDLCALC 128 (H) 10/12/2021    Therapeutic Lab Levels: No results found for: "LITHIUM" No results found for: "VALPROATE" No results found for: "CBMZ"  Physical Findings   PHQ2-9    [facility] ED from 08/23/2022 in [facility] Office Visit from 07/05/2022 in [facility] Office Visit from 04/12/2022 in [facility] Office Visit from 01/11/2022 in [facility] Office Visit from 10/12/2021 in [facility]  PHQ-2 Total Score 0 0 0 0 0  PHQ-9 Total Score 0 -- -- -- --      [facility] ED from 08/23/2022 in [facility] ED from 10/10/2021 in [facility] ED from 06/27/2021 in [facility] No Risk No Risk No Risk        Musculoskeletal  Strength & Muscle Tone: within normal limits Gait & Station: normal Patient leans: [name]  Psychiatric Specialty Exam  Presentation  General Appearance:  Appropriate for Environment; Casual  Eye Contact: Good  Speech: Clear and Coherent; Normal Rate  Speech Volume: Normal  Handedness: Right   Mood and Affect  Mood: Euthymic  Affect: Congruent   Thought Process  Thought Processes: Coherent  Descriptions of  Associations:Intact  Orientation:Full (Time, Place and Person)  Thought Content:Logical  Diagnosis of Schizophrenia or Schizoaffective disorder in past: No    Hallucinations:Hallucinations: None  Ideas of Reference:None  Suicidal Thoughts:Suicidal Thoughts: No  Homicidal Thoughts:Homicidal Thoughts: No   Sensorium  Memory: Immediate Good; Recent Good; Remote Good  Judgment: Good  Insight: Good   Executive Functions  Concentration: Good  Attention Span: Good  Recall: Roel Cluck of Knowledge: Good  Language: Good   Psychomotor Activity  Psychomotor Activity: Psychomotor Activity: Normal   Assets  Assets: Communication Skills; Desire for Improvement; Physical Health; Resilience   Sleep  Sleep: Sleep: Good Number of Hours of Sleep: 8   No data recorded  Physical Exam  Physical Exam Vitals and nursing note reviewed.  Constitutional:      General: He is not in acute distress.    Appearance: Normal appearance. He is well-developed.  HENT:     Head: Normocephalic.     Right Ear: External ear normal.     Left Ear: External ear normal.  Eyes:     General:        Right eye: No discharge.        Left eye: No discharge.     Conjunctiva/sclera: Conjunctivae normal.  Cardiovascular:     Rate and Rhythm: Normal rate.  Pulmonary:     Effort: Pulmonary effort is normal. No respiratory distress.  Musculoskeletal:        General: No swelling. Normal range of motion.     Cervical back: Normal range of motion.  Neurological:     Mental Status: He is alert and oriented to person, place, and time.  Psychiatric:        Attention and Perception: Attention and perception normal.        Mood and Affect: Mood and affect normal.        Speech: Speech normal.        Behavior: Behavior normal. Behavior is cooperative.        Thought Content: Thought content normal.        Cognition and Memory: Cognition normal.        Judgment: Judgment normal.     Review of Systems  Constitutional: Negative.   HENT: Negative.    Eyes: Negative.   Respiratory: Negative.    Cardiovascular: Negative.   Musculoskeletal: Negative.   Skin: Negative.   Neurological: Negative.   Psychiatric/Behavioral: Negative.     Blood pressure (!) 142/86, pulse 68, temperature 98.4 F (36.9 C), temperature source Oral, resp. rate 20, SpO2 99 %. There is no height or weight on file to calculate BMI.  Treatment Plan Summary: Disposition: Will continue to have daily contact with patient to assess and evaluate symptoms and progress in treatment and Medication management.  Patient is seeking residential substance abuse treatment specifically [facility].  However he is open to any residential program that he could be admitted to.  Social work will be notified.  Revonda Humphrey, NP 08/27/2022 5:35 PM

## 2022-08-27 NOTE — ED Notes (Signed)
Pt awake a/o, eating breakfast. Denies SI/HI, AVH. Will continue to monitor for safety

## 2022-08-27 NOTE — ED Notes (Signed)
Pt asleep in bed. Respirations even and unlabored. Monitoring for safety. 

## 2022-08-27 NOTE — ED Notes (Signed)
Pt awake, alert, watching TV in dayroom with peers. Denies SI/HI, AVH. Voices no c/o at present time. Will continue to monitor for safety.

## 2022-08-27 NOTE — ED Notes (Signed)
Nurse Annemarie had group this morning with patients. 

## 2022-08-28 ENCOUNTER — Encounter (HOSPITAL_COMMUNITY): Payer: Self-pay

## 2022-08-28 DIAGNOSIS — Z20822 Contact with and (suspected) exposure to covid-19: Secondary | ICD-10-CM | POA: Diagnosis not present

## 2022-08-28 DIAGNOSIS — F1910 Other psychoactive substance abuse, uncomplicated: Secondary | ICD-10-CM | POA: Diagnosis not present

## 2022-08-28 DIAGNOSIS — I10 Essential (primary) hypertension: Secondary | ICD-10-CM | POA: Diagnosis not present

## 2022-08-28 DIAGNOSIS — H409 Unspecified glaucoma: Secondary | ICD-10-CM | POA: Diagnosis not present

## 2022-08-28 NOTE — ED Notes (Signed)
Pt is in the bed sleeping. Respirations are even and unlabored. No acute distress noted. Will continue to monitor for safety. 

## 2022-08-28 NOTE — Tx Team (Signed)
Treatment Team   LCSW and provider, Dr. Alvie Heidelberg met to discuss pt disposition.  LCSW informed Dr. Alvie Heidelberg that the pt had a preference for [facility] [place] or [place].  It was determined after staffing with the Dr. Dwyane Dee that it would be better for the patient to be referred to [place] for residential services.      Omelia Blackwater, MSW, [place] [facility] [phone] phone [phone] work mobile

## 2022-08-28 NOTE — ED Notes (Signed)
Patient is awake and alert on unit presently sitting in the day room waiting for breakfast.  No complaints or distress.  Denies avh shi or plan.  Will monitor and provide supportive environment.

## 2022-08-28 NOTE — BH Specialist Note (Signed)
LCSW Progress Note   0934 - LCSW contacted [facility] to make a referral for the residential rehabilitation program.  LCSW faxed over H&P for further screening to determine if pt would be accepted into the program.  LCSW informed pt of this later on before group started.    Omelia Blackwater, MSW, [place] [facility] [phone] phone [phone] work mobile

## 2022-08-28 NOTE — ED Provider Notes (Signed)
[facility] Progress Note  Date & Time: 08/28/2022 2:38 PM Name: Brady Church Age: 60 y.o.  DOB: 08/28/1961  MRN: [id number]  Diagnosis:  Final diagnoses:  Polysubstance abuse (HCC)    Reason for presentation: Addiction Problem  Brief HPI  Brady Church is a 60 y.o. 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  UDS + cocaine, marijuana. BAL neg.  Interval Hx   Patient Narrative:   On interview and assessment this morning, the patient reports interest in going to [facility] residential.  He then plans to go to a shelter.  LCSW has sent out referral.  He reports good mood, sleep, and appetite.  He denies experiencing withdrawal symptoms.  He states that he has some mild diarrhea from metformin.  He denies experiencing suicidal or homicidal thoughts.  He denies experiencing auditory or visual hallucinations.     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 Age of First Use: 20s.  2-40 oz beers.  Frequency: daily. Duration: "years". Tobacco:  reports that he quit smoking about 5 years ago. His smoking use included cigarettes. He smoked an average of .5 packs per day. He has never used smokeless tobacco.  Cannabis: yes Opiates: Denied Stimulants: cocaine. Age of First Use: 20s. Frequency: 3-4 x per week or "when I can get it". 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 Stopped in 2010   Tobacco use disorder 10/07/2014    Past Surgical History:  Procedure Laterality Date   CATARACT EXTRACTION  01/08/2012   Right eye   EYE SURGERY     x 4 total    Family History:  Family History  Problem Relation Age of Onset   Coronary artery disease Mother 48       died of MI   Cancer Father 55       Some GI cancer. Not sure if it was Colon Cancer or not.   Hypertension Father    Diabetes Maternal Grandmother    Social History   Substance and Sexual Activity  Alcohol Use Yes   Comment: Beer- 40 oz. every day.    Social   History   Substance and Sexual Activity  Drug Use Not Currently   Types: Cocaine, Marijuana   Comment: Sometimes.    Social History   Socioeconomic History   Marital status: Single    Spouse name: Not on file   Number of children: Not on file   Years of education: 12   Highest education level: Not on file  Occupational History   Not on file  Tobacco Use   Smoking status: Former    Packs/day: 0.50    Types: Cigarettes    Quit date: 02/18/2017    Years since quitting: 5.5   Smokeless  tobacco: Never  Substance and Sexual Activity   Alcohol use: Yes    Comment: Beer- 40 oz. every day.   Drug use: Not Currently    Types: Cocaine, Marijuana    Comment: Sometimes.   Sexual activity: Not on file  Other Topics Concern   Not on file  Social History Narrative   Lives in  for last 10 years with "friend"  Lila Evans      Is not married and does not have kids.   Working sporadically as a brick mason.    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   Social Determinants of Health   Financial Resource Strain: Not on file  Food Insecurity: Not on file  Transportation Needs: Not on file  Physical Activity: Not on file  Stress: Not on file  Social Connections: Not on file   SDOH: SDOH Screenings   Depression (PHQ2-9): Low Risk  (08/25/2022)  Recent Concern: Depression (PHQ2-9) - Medium Risk (08/23/2022)  Tobacco Use: Medium Risk (08/24/2022)   Additional Social History: Alcohol / Drug Use Pain Medications: See MAR Prescriptions: See MAR Over the Counter: See MAR History of alcohol / drug use?: Yes Longest period of sobriety (when/how long): denies sobriety periods Negative Consequences of Use: Financial, Personal relationships Withdrawal Symptoms: None Substance #1 Name of Substance 1: ETOH 1 - Age of First Use: 20s 1 - Amount (size/oz): 2-40 oz beers 1 - Frequency: daily 1 - Duration: "years" 1 - Last Use / Amount: last night- 2-40s 1 - Method of Aquiring: NA 1- Route of Use: drinks Substance #2 Name of Substance 2: Cocaine 2 - Age of First Use: 20s 2 - Amount (size/oz): unknown 2 - Frequency: 3-4 x per week or "when I can get it" 2 - Duration: "years" 2 - Last Use / Amount: last night 2 - Method of Aquiring: NA 2 - Route of Substance Use: smokes Current Medications   Current Facility-Administered Medications  Medication Dose Route Frequency Provider Last Rate Last Admin   acetaminophen (TYLENOL) tablet 650 mg  650 mg  Oral Q6H PRN Lee, Jacqueline Eun, NP       alum & mag hydroxide-simeth (MAALOX/MYLANTA) 200-200-20 MG/5ML suspension 30 mL  30 mL Oral Q4H PRN Lee, Jacqueline Eun, NP       atorvastatin (LIPITOR) tablet 80 mg  80 mg Oral q1800 Lee, Jacqueline Eun, NP   80 mg at 08/27/22 1801   brimonidine (ALPHAGAN) 0.15 % ophthalmic solution 1 drop  1 drop Both Eyes BID Lee, Jacqueline Eun, NP   1 drop at 08/28/22 0909   dorzolamide-timolol (COSOPT) 22.3-6.8 MG/ML ophthalmic solution 1 drop  1 drop Right Eye BID Lee, Jacqueline Eun, NP   1 drop at 08/28/22 0910   fluorometholone (FML) 0.1 % ophthalmic suspension 1 drop  1 drop Both Eyes Daily Kumar, Archana, MD     1 drop at 08/28/22 0909   gabapentin (NEURONTIN) capsule 300 mg  300 mg Oral TID Tharon Aquas, NP   300 mg at 08/28/22 8101   hydrochlorothiazide (HYDRODIURIL) tablet 50 mg  50 mg Oral Daily Merrily Brittle, DO   50 mg at 08/28/22 0905   lisinopril (ZESTRIL) tablet 40 mg  40 mg Oral Daily Onuoha, Chinwendu V, NP   40 mg at 08/28/22 0910   magnesium hydroxide (MILK OF MAGNESIA) suspension 30 mL  30 mL Oral Daily PRN Tharon Aquas, NP       metFORMIN (GLUCOPHAGE) tablet 500 mg  500 mg Oral BID WC Tharon Aquas, NP   500 mg at 08/28/22 7510   multivitamin with minerals tablet 1 tablet  1 tablet Oral Daily Tharon Aquas, NP   1 tablet at 08/28/22 0908   naltrexone (DEPADE) tablet 50 mg  50 mg Oral QHS Merrily Brittle, DO   50 mg at 08/26/22 2105   Netarsudil-Latanoprost 0.02-0.005 % SOLN 1 drop  1 drop Right Eye QHS Tharon Aquas, NP   1 drop at 08/27/22 2106   sertraline (ZOLOFT) tablet 50 mg  50 mg Oral Daily Merrily Brittle, DO   50 mg at 08/28/22 2585   thiamine (VITAMIN B1) tablet 100 mg  100 mg Oral Daily Tharon Aquas, NP   100 mg at 08/28/22 0908   Current Outpatient Medications  Medication Sig Dispense Refill   ALPHAGAN P 0.1 % SOLN INSTILL 1 DROP INTO EACH EYE TWICE DAILY (Patient taking differently: Place 1 drop into  both eyes in the morning and at bedtime.) 5 mL 5   aspirin 325 MG tablet Take 325 mg by mouth daily.     atorvastatin (LIPITOR) 80 MG tablet TAKE 1 TABLET BY MOUTH ONCE DAILY AT  6  PM (Patient taking differently: Take 80 mg by mouth daily at 6 PM.) 90 tablet 3   dorzolamide-timolol (COSOPT) 22.3-6.8 MG/ML ophthalmic solution Place 1 drop into the right eye 2 (two) times daily. 10 mL 5   fluorometholone (FML) 0.1 % ophthalmic suspension INSTILL 1 DROP INTO EACH EYE ONCE DAILY (Patient taking differently: Place 1 drop into both eyes daily.) 5 mL 5   gabapentin (NEURONTIN) 300 MG capsule Take 1 capsule (300 mg total) by mouth 3 (three) times daily. 90 capsule 11   hydrochlorothiazide (HYDRODIURIL) 25 MG tablet Take 1 tablet (25 mg total) by mouth daily. 90 tablet 0   lisinopril (ZESTRIL) 40 MG tablet Take 1 tablet (40 mg total) by mouth daily. 30 tablet 5   metFORMIN (GLUCOPHAGE) 500 MG tablet Take 1 tablet (500 mg total) by mouth 2 (two) times daily with a meal. 180 tablet 0   ROCKLATAN 0.02-0.005 % SOLN Place 1 drop into the right eye at bedtime.     sildenafil (REVATIO) 20 MG tablet TAKE 2 TO 5 TABLETS BY MOUTH AS NEEDED FOR 30 MINUTES PRIOR TO SEXUAL ACTIVITY (Patient taking differently: Take 40-100 mg by mouth daily as needed (Take 30 minutes prior to sexual activity).) 20 tablet 2    Labs / Images  Lab Results:  Admission on 08/23/2022  Component Date Value Ref Range Status   SARS Coronavirus 2 by RT PCR 08/23/2022 NEGATIVE  NEGATIVE Final   Comment: (NOTE) SARS-CoV-2 target nucleic acids are NOT DETECTED.  The SARS-CoV-2 RNA is generally detectable in upper respiratory specimens during the acute phase of infection. The lowest concentration of SARS-CoV-2 viral copies this assay can detect is  138 copies/mL. A negative result does not preclude SARS-Cov-2 infection and should not be used as the sole basis for treatment or other patient management decisions. A negative result may occur with   improper specimen collection/handling, submission of specimen other than nasopharyngeal swab, presence of viral mutation(s) within the areas targeted by this assay, and inadequate number of viral copies(<138 copies/mL). A negative result must be combined with clinical observations, patient history, and epidemiological information. The expected result is Negative.  Fact Sheet for Patients:  https://www.fda.gov/media/152166/download  Fact Sheet for Healthcare Providers:  https://www.fda.gov/media/152162/download  This test is no                          t yet approved or cleared by the [place] FDA and  has been authorized for detection and/or diagnosis of SARS-CoV-2 by FDA under an Emergency Use Authorization (EUA). This EUA will remain  in effect (meaning this test can be used) for the duration of the COVID-19 declaration under Section 564(b)(1) of the Act, 21 U.S.C.section 360bbb-3(b)(1), unless the authorization is terminated  or revoked sooner.       Influenza A by PCR 08/23/2022 NEGATIVE  NEGATIVE Final   Influenza B by PCR 08/23/2022 NEGATIVE  NEGATIVE Final   Comment: (NOTE) The Xpert Xpress SARS-CoV-2/FLU/RSV plus assay is intended as an aid in the diagnosis of influenza from Nasopharyngeal swab specimens and should not be used as a sole basis for treatment. Nasal washings and aspirates are unacceptable for Xpert Xpress SARS-CoV-2/FLU/RSV testing.  Fact Sheet for Patients: https://www.fda.gov/media/152166/download  Fact Sheet for Healthcare Providers: https://www.fda.gov/media/152162/download  This test is not yet approved or cleared by the [place] FDA and has been authorized for detection and/or diagnosis of SARS-CoV-2 by FDA under an Emergency Use Authorization (EUA). This EUA will remain in effect (meaning this test can be used) for the duration of the COVID-19 declaration under Section 564(b)(1) of the Act, 21 U.S.C. section 360bbb-3(b)(1), unless  the authorization is terminated or revoked.  Performed at  [facility], [address] [place], [place] [address]    WBC 08/23/2022 5.9  4.0 - 10.5 K/uL Final   RBC 08/23/2022 4.16 (L)  4.22 - 5.81 MIL/uL Final   Hemoglobin 08/23/2022 13.7  13.0 - 17.0 g/dL Final   HCT 08/23/2022 37.9 (L)  39.0 - 52.0 % Final   MCV 08/23/2022 91.1  80.0 - 100.0 fL Final   MCH 08/23/2022 32.9  26.0 - 34.0 pg Final   MCHC 08/23/2022 36.1 (H)  30.0 - 36.0 g/dL Final   RDW 08/23/2022 14.0  11.5 - 15.5 % Final   Platelets 08/23/2022 PLATELET CLUMPS NOTED ON SMEAR, UNABLE TO ESTIMATE  150 - 400 K/uL Final   REPEATED TO VERIFY   nRBC 08/23/2022 0.0  0.0 - 0.2 % Final   Neutrophils Relative % 08/23/2022 58  % Final   Neutro Abs 08/23/2022 3.5  1.7 - 7.7 K/uL Final   Lymphocytes Relative 08/23/2022 33  % Final   Lymphs Abs 08/23/2022 1.9  0.7 - 4.0 K/uL Final   Monocytes Relative 08/23/2022 6  % Final   Monocytes Absolute 08/23/2022 0.3  0.1 - 1.0 K/uL Final   Eosinophils Relative 08/23/2022 2  % Final   Eosinophils Absolute 08/23/2022 0.1  0.0 - 0.5 K/uL Final   Basophils Relative 08/23/2022 1  % Final   Basophils Absolute 08/23/2022 0.0  0.0 - 0.1 K/uL Final   Immature Granulocytes 08/23/2022 0  %   Final   Abs Immature Granulocytes 08/23/2022 0.02  0.00 - 0.07 K/uL Final   Performed at [facility] [address] [place], [place] [address]   Sodium 08/23/2022 139  135 - 145 mmol/L Final   Potassium 08/23/2022 3.5  3.5 - 5.1 mmol/L Final   Chloride 08/23/2022 101  98 - 111 mmol/L Final   CO2 08/23/2022 31  22 - 32 mmol/L Final   Glucose, Bld 08/23/2022 204 (H)  70 - 99 mg/dL Final   Glucose reference range applies only to samples taken after fasting for at least 8 hours.   BUN 08/23/2022 16  6 - 20 mg/dL Final   Creatinine, Ser 08/23/2022 0.95  0.61 - 1.24 mg/dL Final   Calcium 08/23/2022 9.5  8.9 - 10.3 mg/dL Final   Total Protein 08/23/2022 6.9  6.5 - 8.1 g/dL Final   Albumin  08/23/2022 3.9  3.5 - 5.0 g/dL Final   AST 08/23/2022 24  15 - 41 U/L Final   ALT 08/23/2022 22  0 - 44 U/L Final   Alkaline Phosphatase 08/23/2022 57  38 - 126 U/L Final   Total Bilirubin 08/23/2022 0.6  0.3 - 1.2 mg/dL Final   GFR, Estimated 08/23/2022 >60  >60 mL/min Final   Comment: (NOTE) Calculated using the CKD-EPI Creatinine Equation (2021)    Anion gap 08/23/2022 7  5 - 15 Final   Performed at [facility] [address] [place], [place] [address]   Alcohol, Ethyl (B) 08/23/2022 <10  <10 mg/dL Final   Comment: (NOTE) Lowest detectable limit for serum alcohol is 10 mg/dL.  For medical purposes only. Performed at [facility], [place] [address], [place], [place] [address]    POC Amphetamine UR 08/23/2022 None Detected  NONE DETECTED (Cut Off Level 1000 ng/mL) Final   POC Secobarbital (BAR) 08/23/2022 None Detected  NONE DETECTED (Cut Off Level 300 ng/mL) Final   POC Buprenorphine (BUP) 08/23/2022 None Detected  NONE DETECTED (Cut Off Level 10 ng/mL) Final   POC Oxazepam (BZO) 08/23/2022 None Detected  NONE DETECTED (Cut Off Level 300 ng/mL) Final   POC Cocaine UR 08/23/2022 Positive (A)  NONE DETECTED (Cut Off Level 300 ng/mL) Final   POC Methamphetamine UR 08/23/2022 None Detected  NONE DETECTED (Cut Off Level 1000 ng/mL) Final   POC Morphine 08/23/2022 None Detected  NONE DETECTED (Cut Off Level 300 ng/mL) Final   POC Methadone UR 08/23/2022 None Detected  NONE DETECTED (Cut Off Level 300 ng/mL) Final   POC Oxycodone UR 08/23/2022 None Detected  NONE DETECTED (Cut Off Level 100 ng/mL) Final   POC Marijuana UR 08/23/2022 Positive (A)  NONE DETECTED (Cut Off Level 50 ng/mL) Final   SARSCOV2ONAVIRUS 2 AG 08/23/2022 NEGATIVE  NEGATIVE Final   Comment: (NOTE) SARS-CoV-2 antigen NOT DETECTED.   Negative results are presumptive.  Negative results do not preclude SARS-CoV-2 infection and should not be used as the sole basis for treatment or other patient management  decisions, including infection  control decisions, particularly in the presence of clinical signs and  symptoms consistent with COVID-19, or in those who have been in contact with the virus.  Negative results must be combined with clinical observations, patient history, and epidemiological information. The expected result is Negative.  Fact Sheet for Patients: HandmadeRecipes.com.cy  Fact Sheet for Healthcare Providers: FuneralLife.at  This test is not yet approved or cleared by the [place] FDA and  has been authorized for detection and/or diagnosis  of SARS-CoV-2 by FDA under an Emergency Use Authorization (EUA).  This EUA will remain in effect (meaning this test can be used) for the duration of  the COV                          ID-19 declaration under Section 564(b)(1) of the Act, 21 U.S.C. section 360bbb-3(b)(1), unless the authorization is terminated or revoked sooner.     Cholesterol 08/23/2022 220 (H)  0 - 200 mg/dL Final   Triglycerides 08/23/2022 112  <150 mg/dL Final   HDL 08/23/2022 61  >40 mg/dL Final   Total CHOL/HDL Ratio 08/23/2022 3.6  RATIO Final   VLDL 08/23/2022 22  0 - 40 mg/dL Final   LDL Cholesterol 08/23/2022 137 (H)  0 - 99 mg/dL Final   Comment:        Total Cholesterol/HDL:CHD Risk Coronary Heart Disease Risk Table                     Men   Women  1/2 Average Risk   3.4   3.3  Average Risk       5.0   4.4  2 X Average Risk   9.6   7.1  3 X Average Risk  23.4   11.0        Use the calculated Patient Ratio above and the CHD Risk Table to determine the patient's CHD Risk.        ATP III CLASSIFICATION (LDL):  <100     mg/dL   Optimal  100-129  mg/dL   Near or Above                    Optimal  130-159  mg/dL   Borderline  160-189  mg/dL   High  >190     mg/dL   Very High Performed at [facility] [address], [place], [place] [address]    TSH 08/23/2022 1.406  0.350 - 4.500 uIU/mL Final    Comment: Performed by a 3rd Generation assay with a functional sensitivity of <=0.01 uIU/mL. Performed at [facility], [place] [address], [place], [place] [address]   Congregational Nurse Program on 08/09/2022  Component Date Value Ref Range Status   POC Glucose 08/09/2022 253 (A)  70 - 99 mg/dl Final   non fasting  Congregational Nurse Program on 07/26/2022  Component Date Value Ref Range Status   POC Glucose 07/26/2022 175 (A)  70 - 99 mg/dl Final   non fasting  Congregational Nurse Program on 07/21/2022  Component Date Value Ref Range Status   POC Glucose 07/21/2022 302 (A)  70 - 99 mg/dl Final   non fasting  Congregational Nurse Program on 07/12/2022  Component Date Value Ref Range Status   POC Glucose 07/12/2022 225 (A)  70 - 99 mg/dl Final   non fasting  Congregational Nurse Program on 07/07/2022  Component Date Value Ref Range Status   POC Glucose 07/07/2022 274 (A)  70 - 99 mg/dl Final   non fasting  Office Visit on 07/05/2022  Component Date Value Ref Range Status   Hemoglobin A1C 07/05/2022 8.0 (A)  4.0 - 5.6 % Final   Glucose-Capillary 07/05/2022 183 (H)  70 - 99 mg/dL Final   Glucose reference range applies only to samples taken after fasting for at least 8 hours.   Glucose 07/05/2022 175 (H)  70 - 99 mg/dL Final  BUN 07/05/2022 16  6 - 24 mg/dL Final   Creatinine, Ser 07/05/2022 1.04  0.76 - 1.27 mg/dL Final   eGFR 07/05/2022 83  >59 mL/min/1.73 Final   BUN/Creatinine Ratio 07/05/2022 15  9 - 20 Final   Sodium 07/05/2022 142  134 - 144 mmol/L Final   Potassium 07/05/2022 4.1  3.5 - 5.2 mmol/L Final   Chloride 07/05/2022 103  96 - 106 mmol/L Final   CO2 07/05/2022 22  20 - 29 mmol/L Final   Anion Gap 07/05/2022 17.0  10.0 - 18.0 mmol/L Final   Calcium 07/05/2022 9.7  8.7 - 10.2 mg/dL Final   Total Protein 07/05/2022 7.3  6.0 - 8.5 g/dL Final   Albumin 07/05/2022 4.4  3.8 - 4.9 g/dL Final   Globulin, Total 07/05/2022 2.9  1.5 - 4.5 g/dL Final    Albumin/Globulin Ratio 07/05/2022 1.5  1.2 - 2.2 Final   Bilirubin Total 07/05/2022 0.3  0.0 - 1.2 mg/dL Final   Alkaline Phosphatase 07/05/2022 69  44 - 121 IU/L Final   AST 07/05/2022 26  0 - 40 IU/L Final   ALT 07/05/2022 22  0 - 44 IU/L Final  Congregational Nurse Program on 06/21/2022  Component Date Value Ref Range Status   POC Glucose 06/21/2022 284 (A)  70 - 99 mg/dl Final   non fasting  Congregational Nurse Program on 06/19/2022  Component Date Value Ref Range Status   POC Glucose 06/19/2022 287 (A)  70 - 99 mg/dl Final   non fasting  Congregational Nurse Program on 06/09/2022  Component Date Value Ref Range Status   POC Glucose 06/09/2022 220 (A)  70 - 99 mg/dl Final   non fasting  There may be more visits with results that are not included.   Blood Alcohol level:  Lab Results  Component Value Date   ETH <10 08/23/2022   ETH 12 (H) 10/10/2021   Metabolic Disorder Labs: Lab Results  Component Value Date   HGBA1C 8.0 (A) 07/05/2022   MPG 367 02/17/2017   MPG 258 04/19/2015   No results found for: "PROLACTIN" Lab Results  Component Value Date   CHOL 220 (H) 08/23/2022   TRIG 112 08/23/2022   HDL 61 08/23/2022   CHOLHDL 3.6 08/23/2022   VLDL 22 08/23/2022   LDLCALC 137 (H) 08/23/2022   LDLCALC 128 (H) 10/12/2021   Therapeutic Lab Levels: No results found for: "LITHIUM" No results found for: "VALPROATE" No results found for: "CBMZ" Physical Findings   PHQ2-9    Flowsheet Row ED from 08/23/2022 in [facility] Office Visit from 07/05/2022 in [facility] Office Visit from 04/12/2022 in [facility] Office Visit from 01/11/2022 in [facility] Office Visit from 10/12/2021 in [facility]  PHQ-2 Total Score 0 0 0 0 0  PHQ-9 Total Score 0 -- -- -- --      Flowsheet Row ED from 08/23/2022 in [facility] ED from  10/10/2021 in [facility] ED from 06/27/2021 in [facility]  C-SSRS RISK CATEGORY No Risk No Risk No Risk       Musculoskeletal  Strength & Muscle Tone: grossly within normal limits Gait & Station: nml station, gait short steps Patient leans: [name]   Psychiatric Specialty Exam   Psychiatric Specialty Exam: Review of Systems  Respiratory:  Negative for shortness of breath.   Cardiovascular:    Negative for chest pain.  Gastrointestinal:  Negative for abdominal pain, constipation, nausea and vomiting.  Neurological:  Negative for headaches.       BP (!) 139/93 (BP Location: Right Arm)   Pulse 72   Temp 98 F (36.7 C) (Tympanic)   Resp 18   SpO2 98%   General Appearance: Fairly Groomed  Eye Contact:  Good  Speech:  Clear and Coherent  Volume:  Normal  Mood:  Euthymic  Affect:  Congruent  Thought Process:  Coherent  Orientation:  Full (Time, Place, and Person)  Thought Content: Logical   Suicidal Thoughts:  No  Homicidal Thoughts:  No  Memory:  Immediate;   Good  Judgement:  Good  Insight:  Good  Psychomotor Activity:  Normal  Concentration:  Concentration: Good  Recall:  Good  Fund of Knowledge: Good  Language: Good  Akathisia:  No  Handed:    AIMS (if indicated): not done  Assets:  Communication Skills Desire for Improvement Financial Resources/Insurance Housing Leisure Time Physical Health  ADL's:  Intact  Cognition: WNL  Sleep:  Fair     Physical Exam  BP (!) 139/93 (BP Location: Right Arm)   Pulse 72   Temp 98 F (36.7 C) (Tympanic)   Resp 18   SpO2 98%  Physical Exam Constitutional:      Appearance: the patient is not toxic-appearing.  Pulmonary:     Effort: Pulmonary effort is normal.  Neurological:     General: No focal deficit present.     Mental Status: the patient is alert and oriented to person, place, and time.   Review of Systems  Respiratory:  Negative for  shortness of breath.   Cardiovascular:  Negative for chest pain.  Gastrointestinal:  Negative for abdominal pain, constipation, diarrhea, nausea and vomiting.  Neurological:  Negative for headaches.     Assessment / Plan  Total Time spent with patient: 30 minutes Treatment Plan Summary: Daily contact with patient to assess and evaluate symptoms and progress in treatment and Medication management  Principal Problem:   Alcohol use disorder, severe, dependence (HCC) Active Problems:   Hypertension   Hyperlipidemia LDL goal <100   Glaucoma   History of tobacco use   Diabetes mellitus with diabetic cataract ([place])   Cannabis use disorder   Housing instability   Brady Church is a 61 y.o. 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 for EtOH and cocaine detox, and assistance with residential rehab.  UDS + cocaine, marijuana. BAL neg.  Total duration of encounter: 5 days  AUD, severe (action stage) Daily drink about 40oz x2 daily for years, with last drink was 08/22/2022. 1 week prior to presentation, he was able to go 2 days w/o EtOH. Longest length of sobriety was 1 yr + 36moin 2012. Denied h/o seizures and DT. [facility]  [facility] neg, AST/ALT wnl. 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  No GI side effects.  Continue Zoloft 50 mg daily (new medication)  HTN Systolic (250PTW, ASFK:812, Min:139 , MXNT:700 Diastolic (217CBS, AWHQ:75 Min:93, Max:95 Increased home hydrochlorothiazide,  25 mg to 50mg Daily Continued lisinopril, 40 mg, Daily  Cannabis use d/o  (precontemplative stage)  h/o tobacco use d/o, sustained remission Encouraged cessation Comfort PRNs   STABLE HLD atorvastatin, 80 mg, q1800 Glaucoma brimonidine, 1 drop, BID dorzolamide-timolol, 1 drop, BID Netarsudil-Latanoprost, 1 drop, QHS fluorometholone, 1 drop, Daily DM2  Peripheral neuropathy metFORMIN, 500 mg, BID WC gabapentin, 300 mg, TID  DISPO: The patient reports interest in going to [facility].  He then plans to go to a shelter.  LCSW has sent out referral.  Signed: Nick Gabrielle, MD Psychiatry Resident, PGY-2 08/28/2022, 2:38 PM   [facility] [address] [place], [place] [address] Dept: [phone] Dept Fax: [phone] 

## 2022-08-28 NOTE — ED Notes (Signed)
Pt asleep in bed. Respirations even and unlabored. Monitoring for safety. 

## 2022-08-28 NOTE — Progress Notes (Signed)
At patients request he was assisted with making several phone calls including one to pay his storage fees.  Patient is calm, cooperative and makes needs known to staff.  Will monitor.

## 2022-08-28 NOTE — Progress Notes (Signed)
Patient attended group run by Education officer, museum.

## 2022-08-29 DIAGNOSIS — Z20822 Contact with and (suspected) exposure to covid-19: Secondary | ICD-10-CM | POA: Diagnosis not present

## 2022-08-29 DIAGNOSIS — H409 Unspecified glaucoma: Secondary | ICD-10-CM | POA: Diagnosis not present

## 2022-08-29 DIAGNOSIS — I10 Essential (primary) hypertension: Secondary | ICD-10-CM | POA: Diagnosis not present

## 2022-08-29 DIAGNOSIS — F1910 Other psychoactive substance abuse, uncomplicated: Secondary | ICD-10-CM | POA: Diagnosis not present

## 2022-08-29 LAB — POC SARS CORONAVIRUS 2 AG: SARSCOV2ONAVIRUS 2 AG: NEGATIVE

## 2022-08-29 LAB — SARS CORONAVIRUS 2 BY RT PCR: SARS Coronavirus 2 by RT PCR: NEGATIVE

## 2022-08-29 NOTE — ED Notes (Signed)
Pt is in the bed sleeping. Respirations are even and unlabored. No acute distress noted. Will continue to monitor for safety. 

## 2022-08-29 NOTE — ED Provider Notes (Cosign Needed Addendum)
[facility] Progress Note  Date & Time: 08/29/2022 3:59 PM Name: Brady Church Age: 61 y.o.  DOB: 07-Jun-1961  MRN: [id number]  Diagnosis:  Final diagnoses:  Polysubstance abuse ([place])    Reason for presentation: Addiction Problem  Brief HPI  Brady Church is a 61 y.o. 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  UDS + cocaine, marijuana. BAL neg.  Interval Hx   Patient Narrative:   On interview and assessment this morning, the patient reports having appropriate sleep and appetite.  He reports good mood and denies experiencing any suicidal or homicidal thoughts.  He denies experiencing auditory or visual hallucinations.  His thought process is noted to be tangential at times.  The patient does complain of diarrhea and vomiting over the past 24 hours, mild in nature.  The patient denies experiencing any withdrawal symptoms and is 6 days out from his last use, low suspicion for alcohol withdrawal.  Patient reports that he does not take metformin at home.  Plan to discontinue this medication.   Of note, the patient has been accepted to [facility] for residential treatment beginning on Thursday morning.     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 Age of First Use: 57s.  2-40 oz beers. Frequency: daily. Duration: "years". Tobacco:  reports that he quit smoking about 5 years ago. His smoking use included cigarettes. He smoked an average of .5 packs per day. He has never used smokeless tobacco.  Cannabis: yes Opiates: Denied Stimulants: cocaine. Age of First Use: 32s. Frequency: 3-4 x per week or "when I can get it". 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 Stopped in 2010   Tobacco use disorder 10/07/2014    Past Surgical History:  Procedure Laterality Date   CATARACT EXTRACTION  01/08/2012   Right eye   EYE SURGERY     x 4 total    Family History:  Family History  Problem Relation Age of Onset   Coronary artery disease Mother 35       died of MI   Cancer Father 75  Some GI cancer. Not sure if it was Colon Cancer or not.   Hypertension Father    Diabetes Maternal Grandmother    Social History   Substance and Sexual Activity  Alcohol Use Yes   Comment: Beer- 40 oz. every day.    Social History   Substance and Sexual Activity  Drug Use Not Currently   Types: Cocaine, Marijuana   Comment: Sometimes.    Social History   Socioeconomic History   Marital status: Single    Spouse name: Not  on file   Number of children: Not on file   Years of education: 12   Highest education level: Not on file  Occupational History   Not on file  Tobacco Use   Smoking status: Former    Packs/day: 0.50    Types: Cigarettes    Quit date: 02/18/2017    Years since quitting: 5.5   Smokeless tobacco: Never  Substance and Sexual Activity   Alcohol use: Yes    Comment: Beer- 40 oz. every day.   Drug use: Not Currently    Types: Cocaine, Marijuana    Comment: Sometimes.   Sexual activity: Not on file  Other Topics Concern   Not on file  Social History Narrative   Lives in [place] for last 10 years with "friend"  [name]      Is not married and does not have kids.   Working sporadically as a Horticulturist, commercial.    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   Social Determinants of Health   Financial Resource Strain: Not on file  Food Insecurity: Not on file  Transportation Needs: Not on file  Physical Activity: Not on file  Stress: Not on file  Social Connections: Not on file   SDOH: SDOH Screenings   Depression (PHQ2-9): Low Risk  (08/25/2022)  Recent Concern: Depression (PHQ2-9) - Medium Risk (08/23/2022)  Tobacco Use: Medium Risk (08/24/2022)   Additional Social History: Alcohol / Drug Use Pain Medications: See MAR Prescriptions: See MAR Over the Counter: See MAR History of alcohol / drug use?: Yes Longest period of sobriety (when/how long): denies sobriety periods Negative Consequences of Use: Financial, Personal relationships Withdrawal Symptoms: None Substance #1 Name of Substance 1: ETOH 1 - Age of First Use: 20s 1 - Amount (size/oz): 2-40 oz beers 1 - Frequency: daily 1 - Duration: "years" 1 - Last Use / Amount: last night- 2-40s 1 - Method of Aquiring: NA 1- Route of Use: drinks Substance #2 Name of Substance 2: Cocaine 2 - Age of First Use: 34s 2 - Amount (size/oz): unknown 2 - Frequency: 3-4 x per week or "when I can get it" 2 -  Duration: "years" 2 - Last Use / Amount: last night 2 - Method of Aquiring: NA 2 - Route of Substance Use: smokes Current Medications   Current Facility-Administered Medications  Medication Dose Route Frequency Provider Last Rate Last Admin   acetaminophen (TYLENOL) tablet 650 mg  650 mg Oral Q6H PRN Tharon Aquas, NP       alum & mag hydroxide-simeth (MAALOX/MYLANTA) 200-200-20 MG/5ML suspension 30 mL  30 mL Oral Q4H PRN Tharon Aquas, NP       atorvastatin (LIPITOR) tablet 80 mg  80 mg Oral q1800 Tharon Aquas, NP   80 mg at 08/28/22 1700   brimonidine (ALPHAGAN) 0.15 % ophthalmic solution 1 drop  1 drop Both Eyes BID Tharon Aquas, NP   1 drop  at 08/29/22 0957   dorzolamide-timolol (COSOPT) 22.3-6.8 MG/ML ophthalmic solution 1 drop  1 drop Right Eye BID Lauree Chandler, NP   1 drop at 08/29/22 0959   fluorometholone (FML) 0.1 % ophthalmic suspension 1 drop  1 drop Both Eyes Daily Nelly Rout, MD   1 drop at 08/29/22 0957   gabapentin (NEURONTIN) capsule 300 mg  300 mg Oral TID Lauree Chandler, NP   300 mg at 08/29/22 0953   hydrochlorothiazide (HYDRODIURIL) tablet 50 mg  50 mg Oral Daily Princess Bruins, DO   50 mg at 08/29/22 0952   lisinopril (ZESTRIL) tablet 40 mg  40 mg Oral Daily Onuoha, Chinwendu V, NP   40 mg at 08/29/22 0953   magnesium hydroxide (MILK OF MAGNESIA) suspension 30 mL  30 mL Oral Daily PRN Lauree Chandler, NP       metFORMIN (GLUCOPHAGE) tablet 500 mg  500 mg Oral BID WC Lauree Chandler, NP   500 mg at 08/29/22 0845   multivitamin with minerals tablet 1 tablet  1 tablet Oral Daily Lauree Chandler, NP   1 tablet at 08/29/22 0953   naltrexone (DEPADE) tablet 50 mg  50 mg Oral QHS Princess Bruins, DO   50 mg at 08/28/22 2130   Netarsudil-Latanoprost 0.02-0.005 % SOLN 1 drop  1 drop Right Eye QHS Lauree Chandler, NP   1 drop at 08/28/22 2132   sertraline (ZOLOFT) tablet 50 mg  50 mg Oral Daily Princess Bruins, DO   50 mg at  08/29/22 2119   thiamine (VITAMIN B1) tablet 100 mg  100 mg Oral Daily Lauree Chandler, NP   100 mg at 08/29/22 2636   Current Outpatient Medications  Medication Sig Dispense Refill   ALPHAGAN P 0.1 % SOLN INSTILL 1 DROP INTO EACH EYE TWICE DAILY (Patient taking differently: Place 1 drop into both eyes in the morning and at bedtime.) 5 mL 5   aspirin 325 MG tablet Take 325 mg by mouth daily.     atorvastatin (LIPITOR) 80 MG tablet TAKE 1 TABLET BY MOUTH ONCE DAILY AT  6  PM (Patient taking differently: Take 80 mg by mouth daily at 6 PM.) 90 tablet 3   dorzolamide-timolol (COSOPT) 22.3-6.8 MG/ML ophthalmic solution Place 1 drop into the right eye 2 (two) times daily. 10 mL 5   fluorometholone (FML) 0.1 % ophthalmic suspension INSTILL 1 DROP INTO EACH EYE ONCE DAILY (Patient taking differently: Place 1 drop into both eyes daily.) 5 mL 5   gabapentin (NEURONTIN) 300 MG capsule Take 1 capsule (300 mg total) by mouth 3 (three) times daily. 90 capsule 11   hydrochlorothiazide (HYDRODIURIL) 25 MG tablet Take 1 tablet (25 mg total) by mouth daily. 90 tablet 0   lisinopril (ZESTRIL) 40 MG tablet Take 1 tablet (40 mg total) by mouth daily. 30 tablet 5   metFORMIN (GLUCOPHAGE) 500 MG tablet Take 1 tablet (500 mg total) by mouth 2 (two) times daily with a meal. 180 tablet 0   ROCKLATAN 0.02-0.005 % SOLN Place 1 drop into the right eye at bedtime.     sildenafil (REVATIO) 20 MG tablet TAKE 2 TO 5 TABLETS BY MOUTH AS NEEDED FOR 30 MINUTES PRIOR TO SEXUAL ACTIVITY (Patient taking differently: Take 40-100 mg by mouth daily as needed (Take 30 minutes prior to sexual activity).) 20 tablet 2    Labs / Images  Lab Results:  Admission on 08/23/2022  Component Date Value Ref Range Status  SARS Coronavirus 2 by RT PCR 08/23/2022 NEGATIVE  NEGATIVE Final   Comment: (NOTE) SARS-CoV-2 target nucleic acids are NOT DETECTED.  The SARS-CoV-2 RNA is generally detectable in upper respiratory specimens during the  acute phase of infection. The lowest concentration of SARS-CoV-2 viral copies this assay can detect is 138 copies/mL. A negative result does not preclude SARS-Cov-2 infection and should not be used as the sole basis for treatment or other patient management decisions. A negative result may occur with  improper specimen collection/handling, submission of specimen other than nasopharyngeal swab, presence of viral mutation(s) within the areas targeted by this assay, and inadequate number of viral copies(<138 copies/mL). A negative result must be combined with clinical observations, patient history, and epidemiological information. The expected result is Negative.  Fact Sheet for Patients:  EntrepreneurPulse.com.au  Fact Sheet for Healthcare Providers:  IncredibleEmployment.be  This test is no                          t yet approved or cleared by the [place] FDA and  has been authorized for detection and/or diagnosis of SARS-CoV-2 by FDA under an Emergency Use Authorization (EUA). This EUA will remain  in effect (meaning this test can be used) for the duration of the COVID-19 declaration under Section 564(b)(1) of the Act, 21 U.S.C.section 360bbb-3(b)(1), unless the authorization is terminated  or revoked sooner.       Influenza A by PCR 08/23/2022 NEGATIVE  NEGATIVE Final   Influenza B by PCR 08/23/2022 NEGATIVE  NEGATIVE Final   Comment: (NOTE) The Xpert Xpress SARS-CoV-2/FLU/RSV plus assay is intended as an aid in the diagnosis of influenza from Nasopharyngeal swab specimens and should not be used as a sole basis for treatment. Nasal washings and aspirates are unacceptable for Xpert Xpress SARS-CoV-2/FLU/RSV testing.  Fact Sheet for Patients: EntrepreneurPulse.com.au  Fact Sheet for Healthcare Providers: IncredibleEmployment.be  This test is not yet approved or cleared by the [place] FDA  and has been authorized for detection and/or diagnosis of SARS-CoV-2 by FDA under an Emergency Use Authorization (EUA). This EUA will remain in effect (meaning this test can be used) for the duration of the COVID-19 declaration under Section 564(b)(1) of the Act, 21 U.S.C. section 360bbb-3(b)(1), unless the authorization is terminated or revoked.  Performed at [facility], [place] [address], [place], [place] [address]    WBC 08/23/2022 5.9  4.0 - 10.5 K/uL Final   RBC 08/23/2022 4.16 (L)  4.22 - 5.81 MIL/uL Final   Hemoglobin 08/23/2022 13.7  13.0 - 17.0 g/dL Final   HCT 08/23/2022 37.9 (L)  39.0 - 52.0 % Final   MCV 08/23/2022 91.1  80.0 - 100.0 fL Final   MCH 08/23/2022 32.9  26.0 - 34.0 pg Final   MCHC 08/23/2022 36.1 (H)  30.0 - 36.0 g/dL Final   RDW 08/23/2022 14.0  11.5 - 15.5 % Final   Platelets 08/23/2022 PLATELET CLUMPS NOTED ON SMEAR, UNABLE TO ESTIMATE  150 - 400 K/uL Final   REPEATED TO VERIFY   nRBC 08/23/2022 0.0  0.0 - 0.2 % Final   Neutrophils Relative % 08/23/2022 58  % Final   Neutro Abs 08/23/2022 3.5  1.7 - 7.7 K/uL Final   Lymphocytes Relative 08/23/2022 33  % Final   Lymphs Abs 08/23/2022 1.9  0.7 - 4.0 K/uL Final   Monocytes Relative 08/23/2022 6  % Final   Monocytes Absolute 08/23/2022 0.3  0.1 - 1.0  K/uL Final   Eosinophils Relative 08/23/2022 2  % Final   Eosinophils Absolute 08/23/2022 0.1  0.0 - 0.5 K/uL Final   Basophils Relative 08/23/2022 1  % Final   Basophils Absolute 08/23/2022 0.0  0.0 - 0.1 K/uL Final   Immature Granulocytes 08/23/2022 0  % Final   Abs Immature Granulocytes 08/23/2022 0.02  0.00 - 0.07 K/uL Final   Performed at [facility], [place] [address] [place], [place] [address]   Sodium 08/23/2022 139  135 - 145 mmol/L Final   Potassium 08/23/2022 3.5  3.5 - 5.1 mmol/L Final   Chloride 08/23/2022 101  98 - 111 mmol/L Final   CO2 08/23/2022 31  22 - 32 mmol/L Final   Glucose, Bld 08/23/2022 204 (H)  70 - 99 mg/dL Final    Glucose reference range applies only to samples taken after fasting for at least 8 hours.   BUN 08/23/2022 16  6 - 20 mg/dL Final   Creatinine, Ser 08/23/2022 0.95  0.61 - 1.24 mg/dL Final   Calcium 08/23/2022 9.5  8.9 - 10.3 mg/dL Final   Total Protein 08/23/2022 6.9  6.5 - 8.1 g/dL Final   Albumin 08/23/2022 3.9  3.5 - 5.0 g/dL Final   AST 08/23/2022 24  15 - 41 U/L Final   ALT 08/23/2022 22  0 - 44 U/L Final   Alkaline Phosphatase 08/23/2022 57  38 - 126 U/L Final   Total Bilirubin 08/23/2022 0.6  0.3 - 1.2 mg/dL Final   GFR, Estimated 08/23/2022 >60  >60 mL/min Final   Comment: (NOTE) Calculated using the CKD-EPI Creatinine Equation (2021)    Anion gap 08/23/2022 7  5 - 15 Final   Performed at [facility] [address], [place], [place] [address]   Alcohol, Ethyl (B) 08/23/2022 <10  <10 mg/dL Final   Comment: (NOTE) Lowest detectable limit for serum alcohol is 10 mg/dL.  For medical purposes only. Performed at [facility], [place] [address], [place], [place] [address]    POC Amphetamine UR 08/23/2022 None Detected  NONE DETECTED (Cut Off Level 1000 ng/mL) Final   POC Secobarbital (BAR) 08/23/2022 None Detected  NONE DETECTED (Cut Off Level 300 ng/mL) Final   POC Buprenorphine (BUP) 08/23/2022 None Detected  NONE DETECTED (Cut Off Level 10 ng/mL) Final   POC Oxazepam (BZO) 08/23/2022 None Detected  NONE DETECTED (Cut Off Level 300 ng/mL) Final   POC Cocaine UR 08/23/2022 Positive (A)  NONE DETECTED (Cut Off Level 300 ng/mL) Final   POC Methamphetamine UR 08/23/2022 None Detected  NONE DETECTED (Cut Off Level 1000 ng/mL) Final   POC Morphine 08/23/2022 None Detected  NONE DETECTED (Cut Off Level 300 ng/mL) Final   POC Methadone UR 08/23/2022 None Detected  NONE DETECTED (Cut Off Level 300 ng/mL) Final   POC Oxycodone UR 08/23/2022 None Detected  NONE DETECTED (Cut Off Level 100 ng/mL) Final   POC Marijuana UR 08/23/2022 Positive (A)  NONE DETECTED (Cut Off Level 50  ng/mL) Final   SARSCOV2ONAVIRUS 2 AG 08/23/2022 NEGATIVE  NEGATIVE Final   Comment: (NOTE) SARS-CoV-2 antigen NOT DETECTED.   Negative results are presumptive.  Negative results do not preclude SARS-CoV-2 infection and should not be used as the sole basis for treatment or other patient management decisions, including infection  control decisions, particularly in the presence of clinical signs and  symptoms consistent with COVID-19, or in those who have been in contact with the virus.  Negative results  must be combined with clinical observations, patient history, and epidemiological information. The expected result is Negative.  Fact Sheet for Patients: HandmadeRecipes.com.cy  Fact Sheet for Healthcare Providers: FuneralLife.at  This test is not yet approved or cleared by the [place] FDA and  has been authorized for detection and/or diagnosis of SARS-CoV-2 by FDA under an Emergency Use Authorization (EUA).  This EUA will remain in effect (meaning this test can be used) for the duration of  the COV                          ID-19 declaration under Section 564(b)(1) of the Act, 21 U.S.C. section 360bbb-3(b)(1), unless the authorization is terminated or revoked sooner.     Cholesterol 08/23/2022 220 (H)  0 - 200 mg/dL Final   Triglycerides 08/23/2022 112  <150 mg/dL Final   HDL 08/23/2022 61  >40 mg/dL Final   Total CHOL/HDL Ratio 08/23/2022 3.6  RATIO Final   VLDL 08/23/2022 22  0 - 40 mg/dL Final   LDL Cholesterol 08/23/2022 137 (H)  0 - 99 mg/dL Final   Comment:        Total Cholesterol/HDL:CHD Risk Coronary Heart Disease Risk Table                     Men   Women  1/2 Average Risk   3.4   3.3  Average Risk       5.0   4.4  2 X Average Risk   9.6   7.1  3 X Average Risk  23.4   11.0        Use the calculated Patient Ratio above and the CHD Risk Table to determine the patient's CHD Risk.        ATP III CLASSIFICATION  (LDL):  <100     mg/dL   Optimal  100-129  mg/dL   Near or Above                    Optimal  130-159  mg/dL   Borderline  160-189  mg/dL   High  >190     mg/dL   Very High Performed at [facility] [address], [place], [place] [address]    TSH 08/23/2022 1.406  0.350 - 4.500 uIU/mL Final   Comment: Performed by a 3rd Generation assay with a functional sensitivity of <=0.01 uIU/mL. Performed at [facility], [place] [address], [place], [place] [address]    SARS Coronavirus 2 by RT PCR 08/29/2022 NEGATIVE  NEGATIVE Final   Comment: (NOTE) SARS-CoV-2 target nucleic acids are NOT DETECTED.  The SARS-CoV-2 RNA is generally detectable in upper and lower respiratory specimens during the acute phase of infection. The lowest concentration of SARS-CoV-2 viral copies this assay can detect is 250 copies / mL. A negative result does not preclude SARS-CoV-2 infection and should not be used as the sole basis for treatment or other patient management decisions.  A negative result may occur with improper specimen collection / handling, submission of specimen other than nasopharyngeal swab, presence of viral mutation(s) within the areas targeted by this assay, and inadequate number of viral copies (<250 copies / mL). A negative result must be combined with clinical observations, patient history, and epidemiological information.  Fact Sheet for Patients:   https://www.patel.info/  Fact Sheet for Healthcare Providers: https://hall.com/  This test is not yet approved or  cleared by the [place] and has been authorized for detection and/or diagnosis of SARS-CoV-2 by FDA under an Emergency Use Authorization (EUA).  This EUA will remain in effect (meaning this test can be used) for the duration of the COVID-19 declaration under Section 564(b)(1) of the Act, 21 U.S.C. section 360bbb-3(b)(1), unless the  authorization is terminated or revoked sooner.  Performed at [facility], [place] [address] [place], [place] [address]    SARSCOV2ONAVIRUS 2 AG 08/29/2022 NEGATIVE  NEGATIVE Final   Comment: (NOTE) SARS-CoV-2 antigen NOT DETECTED.   Negative results are presumptive.  Negative results do not preclude SARS-CoV-2 infection and should not be used as the sole basis for treatment or other patient management decisions, including infection  control decisions, particularly in the presence of clinical signs and  symptoms consistent with COVID-19, or in those who have been in contact with the virus.  Negative results must be combined with clinical observations, patient history, and epidemiological information. The expected result is Negative.  Fact Sheet for Patients: HandmadeRecipes.com.cy  Fact Sheet for Healthcare Providers: FuneralLife.at  This test is not yet approved or cleared by the [place] FDA and  has been authorized for detection and/or diagnosis of SARS-CoV-2 by FDA under an Emergency Use Authorization (EUA).  This EUA will remain in effect (meaning this test can be used) for the duration of  the COV                          ID-19 declaration under Section 564(b)(1) of the Act, 21 U.S.C. section 360bbb-3(b)(1), unless the authorization is terminated or revoked sooner.    Congregational Nurse Program on 08/09/2022  Component Date Value Ref Range Status   POC Glucose 08/09/2022 253 (A)  70 - 99 mg/dl Final   non fasting  Congregational Nurse Program on 07/26/2022  Component Date Value Ref Range Status   POC Glucose 07/26/2022 175 (A)  70 - 99 mg/dl Final   non fasting  Congregational Nurse Program on 07/21/2022  Component Date Value Ref Range Status   POC Glucose 07/21/2022 302 (A)  70 - 99 mg/dl Final   non fasting  Congregational Nurse Program on 07/12/2022  Component Date Value Ref Range Status   POC Glucose  07/12/2022 225 (A)  70 - 99 mg/dl Final   non fasting  Congregational Nurse Program on 07/07/2022  Component Date Value Ref Range Status   POC Glucose 07/07/2022 274 (A)  70 - 99 mg/dl Final   non fasting  Office Visit on 07/05/2022  Component Date Value Ref Range Status   Hemoglobin A1C 07/05/2022 8.0 (A)  4.0 - 5.6 % Final   Glucose-Capillary 07/05/2022 183 (H)  70 - 99 mg/dL Final   Glucose reference range applies only to samples taken after fasting for at least 8 hours.   Glucose 07/05/2022 175 (H)  70 - 99 mg/dL Final   BUN 07/05/2022 16  6 - 24 mg/dL Final   Creatinine, Ser 07/05/2022 1.04  0.76 - 1.27 mg/dL Final   eGFR 07/05/2022 83  >59 mL/min/1.73 Final   BUN/Creatinine Ratio 07/05/2022 15  9 - 20 Final   Sodium 07/05/2022 142  134 - 144 mmol/L Final   Potassium 07/05/2022 4.1  3.5 - 5.2 mmol/L Final   Chloride 07/05/2022 103  96 - 106 mmol/L Final   CO2 07/05/2022 22  20 - 29 mmol/L Final   Anion Gap 07/05/2022 17.0  10.0 -  18.0 mmol/L Final   Calcium 07/05/2022 9.7  8.7 - 10.2 mg/dL Final   Total Protein 07/05/2022 7.3  6.0 - 8.5 g/dL Final   Albumin 07/05/2022 4.4  3.8 - 4.9 g/dL Final   Globulin, Total 07/05/2022 2.9  1.5 - 4.5 g/dL Final   Albumin/Globulin Ratio 07/05/2022 1.5  1.2 - 2.2 Final   Bilirubin Total 07/05/2022 0.3  0.0 - 1.2 mg/dL Final   Alkaline Phosphatase 07/05/2022 69  44 - 121 IU/L Final   AST 07/05/2022 26  0 - 40 IU/L Final   ALT 07/05/2022 22  0 - 44 IU/L Final  Congregational Nurse Program on 06/21/2022  Component Date Value Ref Range Status   POC Glucose 06/21/2022 284 (A)  70 - 99 mg/dl Final   non fasting  Congregational Nurse Program on 06/19/2022  Component Date Value Ref Range Status   POC Glucose 06/19/2022 287 (A)  70 - 99 mg/dl Final   non fasting  Congregational Nurse Program on 06/09/2022  Component Date Value Ref Range Status   POC Glucose 06/09/2022 220 (A)  70 - 99 mg/dl Final   non fasting  There may be more visits with  results that are not included.   Blood Alcohol level:  Lab Results  Component Value Date   ETH <10 08/23/2022   ETH 12 (H) 54/62/7035   Metabolic Disorder Labs: Lab Results  Component Value Date   HGBA1C 8.0 (A) 07/05/2022   MPG 367 02/17/2017   MPG 258 04/19/2015   No results found for: "PROLACTIN" Lab Results  Component Value Date   CHOL 220 (H) 08/23/2022   TRIG 112 08/23/2022   HDL 61 08/23/2022   CHOLHDL 3.6 08/23/2022   VLDL 22 08/23/2022   LDLCALC 137 (H) 08/23/2022   LDLCALC 128 (H) 10/12/2021   Therapeutic Lab Levels: No results found for: "LITHIUM" No results found for: "VALPROATE" No results found for: "CBMZ" Physical Findings   PHQ2-9    Flowsheet Row ED from 08/23/2022 in [facility] Office Visit from 07/05/2022 in [place] Office Visit from 04/12/2022 in [facility] Office Visit from 01/11/2022 in [facility] Office Visit from 10/12/2021 in [place]  PHQ-2 Total Score 0 0 0 0 0  PHQ-9 Total Score 0 -- -- -- --      Flowsheet Row ED from 08/23/2022 in [facility] ED from 10/10/2021 in [facility] [facility] ED from 06/27/2021 in [place] No Risk No Risk No Risk       Musculoskeletal  Strength & Muscle Tone: grossly within normal limits Gait & Station: nml station, gait short steps Patient leans: [name]   Psychiatric Specialty Exam   Psychiatric Specialty Exam: Review of Systems  Respiratory:  Negative for shortness of breath.   Cardiovascular:  Negative for chest pain.  Gastrointestinal:  Negative for abdominal pain, constipation, nausea and vomiting.  Neurological:  Negative for headaches.       BP 121/76   Pulse 76   Temp 98.1 F (36.7 C) (Oral)   Resp 18   SpO2 100%   General Appearance: Fairly  Groomed  Eye Contact:  Good  Speech:  Clear and Coherent  Volume:  Normal  Mood:  Euthymic  Affect:  Congruent  Thought Process:  Coherent  Orientation:  Full (Time, Place, and Person)  Thought Content:  Logical   Suicidal Thoughts:  No  Homicidal Thoughts:  No  Memory:  Immediate;   Good  Judgement:  Good  Insight:  Good  Psychomotor Activity:  Normal  Concentration:  Concentration: Good  Recall:  Good  Fund of Knowledge: Good  Language: Good  Akathisia:  No  Handed:    AIMS (if indicated): not done  Assets:  Communication Skills Desire for Improvement Financial Resources/Insurance Housing Leisure Time Physical Health  ADL's:  Intact  Cognition: WNL  Sleep:  Fair     Physical Exam  BP 121/76   Pulse 76   Temp 98.1 F (36.7 C) (Oral)   Resp 18   SpO2 100%  Physical Exam Constitutional:      Appearance: the patient is not toxic-appearing.  Pulmonary:     Effort: Pulmonary effort is normal.  Neurological:     General: No focal deficit present.     Mental Status: the patient is alert and oriented to person, place, and time.   Review of Systems  Respiratory:  Negative for shortness of breath.   Cardiovascular:  Negative for chest pain.  Gastrointestinal:  Negative for abdominal pain, constipation, diarrhea, nausea and vomiting.  Neurological:  Negative for headaches.     Assessment / Plan  Total Time spent with patient: 30 minutes Treatment Plan Summary: Daily contact with patient to assess and evaluate symptoms and progress in treatment and Medication management  Principal Problem:   Alcohol use disorder, severe, dependence (HCC) Active Problems:   Hypertension   Hyperlipidemia LDL goal <100   Glaucoma   History of tobacco use   Diabetes mellitus with diabetic cataract ([place])   Cannabis use disorder   Housing instability   Morrill Bomkamp is a 61 y.o. 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  UDS + cocaine, marijuana. BAL neg.  Total duration of encounter: 6 days  AUD, severe (action stage) Daily drink about 40oz x2 daily for years, with last drink was 08/22/2022. 1 week prior to presentation, he was able to go 2 days w/o EtOH. Longest length of sobriety was 1 yr + 37mo in 2012. Denied h/o seizures and DT. Last time at residential rehab was [facility] was 2012.  BAL neg, AST/ALT wnl. 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  No GI side effects.  Continue Zoloft 50 mg daily (new medication)  HTN Systolic (84TXM), IWO:032 , Min:121 , ZYY:482  Diastolic (50IBB), CWU:88, Min:73, Max:97 Increased home hydrochlorothiazide, 25 mg to $Rem'50mg'tXni$  Daily Continued lisinopril, 40 mg, Daily  Cannabis use d/o (precontemplative stage)  h/o tobacco use d/o, sustained remission Encouraged cessation Comfort PRNs   STABLE HLD atorvastatin, 80 mg, q1800 Glaucoma brimonidine, 1 drop, BID dorzolamide-timolol, 1 drop, BID Netarsudil-Latanoprost, 1 drop, QHS fluorometholone, 1 drop, Daily DM2  Peripheral neuropathy metFORMIN, 500 mg, BID WC - discontinued due to nausea and diarrhea gabapentin, 300 mg, TID  DISPO: Accepted to [facility]  Signed: Corky Sox, MD Psychiatry Resident, PGY-2 08/29/2022, 3:59 PM   [facility] [address] [place], [place] [address] Dept: [phone] Dept Fax: [phone]

## 2022-08-29 NOTE — Progress Notes (Signed)
Received Brady Church this AM in the dayroom preparing to eat his breakfast. He verbalized feeling nausea throughout the night , but better this morning. He stated being sick since yesterday. He denied all of the psychiatric symptoms and stated "I just feel sick."

## 2022-08-29 NOTE — Progress Notes (Signed)
Brady Church c/o of an upset stomach and believes it is related to the medication. Earlier this morning he stated noncompliant with his medications at home.

## 2022-08-29 NOTE — ED Notes (Signed)
Pt had bedtime snack. 

## 2022-08-29 NOTE — ED Notes (Addendum)
Patient attended group watched a video on trauma that leads to addiction afterwards had a discussion. 

## 2022-08-29 NOTE — ED Notes (Signed)
Pt is in the dayroom watching TV.  Respirations are even and unlabored. No acute distress noted. Will continue to monitor for safety. 

## 2022-08-29 NOTE — Progress Notes (Signed)
Brady Church attended the morning Safety plan group session and this writer logged in his answers related to his impaired sight. He continued to consumed his meals throughout the day without complications. He napped throughout the day at intervals. He remained compliant with his medications.

## 2022-08-30 DIAGNOSIS — I10 Essential (primary) hypertension: Secondary | ICD-10-CM | POA: Diagnosis not present

## 2022-08-30 DIAGNOSIS — Z20822 Contact with and (suspected) exposure to covid-19: Secondary | ICD-10-CM | POA: Diagnosis not present

## 2022-08-30 DIAGNOSIS — F1910 Other psychoactive substance abuse, uncomplicated: Secondary | ICD-10-CM | POA: Diagnosis not present

## 2022-08-30 DIAGNOSIS — H409 Unspecified glaucoma: Secondary | ICD-10-CM | POA: Diagnosis not present

## 2022-08-30 MED ORDER — ROCKLATAN 0.02-0.005 % OP SOLN: 1.0000 [drp] | Freq: Every day | OPHTHALMIC | 0 refills | Status: DC

## 2022-08-30 MED ORDER — LISINOPRIL 40 MG PO TABS: 40.0000 mg | ORAL_TABLET | Freq: Every day | ORAL | 1 refills | Status: DC

## 2022-08-30 MED ORDER — GABAPENTIN 300 MG PO CAPS: 300.0000 mg | ORAL_CAPSULE | Freq: Three times a day (TID) | ORAL | 1 refills | Status: DC

## 2022-08-30 MED ORDER — DORZOLAMIDE HCL-TIMOLOL MAL 2-0.5 % OP SOLN: 1.0000 [drp] | Freq: Two times a day (BID) | OPHTHALMIC | 0 refills | Status: DC

## 2022-08-30 MED ORDER — NALTREXONE HCL 50 MG PO TABS: 50.0000 mg | ORAL_TABLET | Freq: Every day | ORAL | 1 refills | Status: DC

## 2022-08-30 MED ORDER — SERTRALINE HCL 50 MG PO TABS: 50.0000 mg | ORAL_TABLET | Freq: Every day | ORAL | 1 refills | Status: DC

## 2022-08-30 MED ORDER — FLUOROMETHOLONE 0.1 % OP SUSP: 1.0000 [drp] | Freq: Every day | OPHTHALMIC | 0 refills | Status: DC

## 2022-08-30 MED ORDER — BRIMONIDINE TARTRATE 0.15 % OP SOLN: 1.0000 [drp] | Freq: Two times a day (BID) | OPHTHALMIC | 0 refills | Status: DC

## 2022-08-30 MED ORDER — HYDROCHLOROTHIAZIDE 50 MG PO TABS: 50.0000 mg | ORAL_TABLET | Freq: Every day | ORAL | 1 refills | Status: DC

## 2022-08-30 MED ORDER — ATORVASTATIN CALCIUM 80 MG PO TABS: 80.0000 mg | ORAL_TABLET | Freq: Every day | ORAL | 1 refills | Status: DC

## 2022-08-30 NOTE — BH Specialist Note (Signed)
Group Process Session  LCSW facilitated group process session regarding self-forgiveness and forgiveness of others.  LCSW utilized open-ended questions to engage participants in a thoughtful and productive discussion.  Pt presented on time and willingly to the group session.  Pt presented as being in a positive  mood with a congruent affect.  Pt's interpersonal interactions with his peers appeared to be on target.  Pt participated in group as he provided relevant feedback that appeared to put things in a helpful perspective for others.     Omelia Blackwater, MSW, [place] phone [phone] work phone

## 2022-08-30 NOTE — Discharge Instructions (Addendum)

## 2022-08-30 NOTE — ED Notes (Signed)
Patient A&Ox4. Denies intent to harm self/others when asked. Denies A/VH. Patient denies any physical complaints when asked. No acute distress noted. Routine safety checks conducted according to facility protocol. Encouraged patient to notify staff if thoughts of harm toward self or others arise. Patient verbalize understanding and agreement. Will continue to monitor for safety.    

## 2022-08-30 NOTE — ED Notes (Signed)
Pt calm, cooperative with staff. Denies concerns at present. Denies SI/HI/VH. Informed pt to notify staff with any needs or concerns. Will continue to monitor for safety.

## 2022-08-30 NOTE — ED Notes (Signed)
Pt sitting in dayroom in no acute distress. Interacting well with peers and staff. No acute distress noted. Informed pt to notify staff with any needs or concerns. Will continue to monitor for safety.

## 2022-08-30 NOTE — BH Specialist Note (Signed)
Group Process Session  LCSW facilitated group process session regarding stress response, triggers, and learning how to have self-compassion when in recovery.  LCSW utilized open-ended questions to engage participants in a thoughtful and productive discussion.  Pt presented on time to group in a positive  mood with a congruent affect.  He appeared to participate well as evidenced by his input and willingness to share his experiences with others where she was able to make a connection with members of his group.  Pt presented with insight regarding his presenting issues and identified future goals for himself such as opening up a hot dog stand.  He shared most about how he will adapt to a positive growth mindset as a way to cope with challenges in his life.  Omelia Blackwater, MSW, [place] phone [phone] work phone

## 2022-08-30 NOTE — ED Notes (Signed)
Pt is in the bed sleeping. Respirations are even and unlabored. No acute distress noted. Will continue to monitor for safety. 

## 2022-08-30 NOTE — ED Provider Notes (Signed)
FBC/OBS ASAP Discharge Summary  Date and Time: 08/30/2022 3:24 PM  Name: Brady Church  MRN:  [id number]   Discharge Diagnoses:  Final diagnoses:  Polysubstance abuse (HCC)   Subjective:   On reassessment pt reports euthymic mood. He reports feeling "wonderful, I'm excited" about going to [facility] tomorrow. He reports "only complaint is I don't have any complaints." Pt denies SI/VI/HI, AVH, paranoia. He denies any physical complaints or withdrawal sx, including fever, chills, HA, dizziness, weakness, cp, palpitations, sob, abd pain, n/v/d/c. Pt states he has established primary care, ophthalmology, and podiatry appointments he will be following up on.   Stay Summary:  Pt is a 61 y/o male presenting to [facility] on 08/23/22 and transferred to [facility]. On reassessment, pt denies SI/VI/HI, AVH, paranoia, and physical complaints or withdrawal sx. He will be going to [facility] tomorrow.   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 Stopped in 2010   Tobacco use disorder 10/07/2014    Past Surgical History:  Procedure Laterality Date   CATARACT EXTRACTION  01/08/2012   Right eye   EYE SURGERY     x 4 total    Family History:  Family History  Problem Relation Age of Onset   Coronary artery disease Mother 37       died of MI   Cancer Father 80       Some GI cancer. Not sure if it was Colon Cancer or not.   Hypertension Father    Diabetes Maternal Grandmother    Family Psychiatric History:  Social History:  Social History   Substance and Sexual Activity  Alcohol Use Yes    Comment: Beer- 40 oz. every day.     Social History   Substance and Sexual Activity  Drug Use Not Currently   Types: Cocaine, Marijuana   Comment: Sometimes.    Social History   Socioeconomic History   Marital status: Single    Spouse name: Not on file   Number of children: Not on file   Years of education: 12   Highest education level: Not on file  Occupational History   Not on file  Tobacco Use   Smoking status: Former    Packs/day: 0.50    Types: Cigarettes    Quit date: 02/18/2017    Years since quitting: 5.5   Smokeless tobacco: Never  Substance and Sexual Activity   Alcohol use: Yes    Comment: Beer- 40 oz. every day.   Drug use: Not Currently    Types: Cocaine, Marijuana    Comment: Sometimes.   Sexual activity: Not on file  Other Topics Concern   Not on file  Social History Narrative   Lives in [place] for last 10 years with "friend"  [name]      Is not married and does not have kids.   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   Social Determinants of Health   Financial Resource Strain:  Not on file  Food Insecurity: Not on file  Transportation Needs: Not on file  Physical Activity: Not on file  Stress: Not on file  Social Connections: Not on file   SDOH:  SDOH Screenings   Depression (PHQ2-9): Low Risk  (08/25/2022)  Recent Concern: Depression (PHQ2-9) - Medium Risk (08/23/2022)  Tobacco Use: Medium Risk (08/24/2022)    Tobacco Cessation:  [name], patient does not currently use tobacco products  Current Medications:  Current Facility-Administered Medications  Medication Dose Route Frequency Provider Last Rate Last Admin   acetaminophen (TYLENOL) tablet 650 mg  650 mg Oral Q6H PRN Lauree Chandler, NP       alum & mag hydroxide-simeth (MAALOX/MYLANTA) 200-200-20 MG/5ML suspension 30 mL  30 mL Oral Q4H PRN Lauree Chandler, NP       atorvastatin (LIPITOR) tablet 80 mg   80 mg Oral q1800 Lauree Chandler, NP   80 mg at 08/29/22 1726   brimonidine (ALPHAGAN) 0.15 % ophthalmic solution 1 drop  1 drop Both Eyes BID Lauree Chandler, NP   1 drop at 08/30/22 0928   dorzolamide-timolol (COSOPT) 22.3-6.8 MG/ML ophthalmic solution 1 drop  1 drop Right Eye BID Lauree Chandler, NP   1 drop at 08/30/22 0928   fluorometholone (FML) 0.1 % ophthalmic suspension 1 drop  1 drop Both Eyes Daily Nelly Rout, MD   1 drop at 08/30/22 5176   gabapentin (NEURONTIN) capsule 300 mg  300 mg Oral TID Lauree Chandler, NP   300 mg at 08/30/22 0926   hydrochlorothiazide (HYDRODIURIL) tablet 50 mg  50 mg Oral Daily Princess Bruins, DO   50 mg at 08/30/22 0925   lisinopril (ZESTRIL) tablet 40 mg  40 mg Oral Daily Onuoha, Chinwendu V, NP   40 mg at 08/30/22 0926   magnesium hydroxide (MILK OF MAGNESIA) suspension 30 mL  30 mL Oral Daily PRN Lauree Chandler, NP       multivitamin with minerals tablet 1 tablet  1 tablet Oral Daily Lauree Chandler, NP   1 tablet at 08/30/22 0926   naltrexone (DEPADE) tablet 50 mg  50 mg Oral QHS Princess Bruins, DO   50 mg at 08/29/22 2145   Netarsudil-Latanoprost 0.02-0.005 % SOLN 1 drop  1 drop Right Eye QHS Lauree Chandler, NP   1 drop at 08/29/22 2149   sertraline (ZOLOFT) tablet 50 mg  50 mg Oral Daily Princess Bruins, DO   50 mg at 08/30/22 1607   thiamine (VITAMIN B1) tablet 100 mg  100 mg Oral Daily Lauree Chandler, NP   100 mg at 08/30/22 3710   Current Outpatient Medications  Medication Sig Dispense Refill   atorvastatin (LIPITOR) 80 MG tablet Take 1 tablet (80 mg total) by mouth daily at 6 PM. 30 tablet 1   brimonidine (ALPHAGAN) 0.15 % ophthalmic solution Place 1 drop into both eyes 2 (two) times daily. 5 mL 0   dorzolamide-timolol (COSOPT) 22.3-6.8 MG/ML ophthalmic solution Place 1 drop into the right eye 2 (two) times daily. 10 mL 0   [START ON 08/31/2022] fluorometholone (FML) 0.1 % ophthalmic suspension Place 1 drop into  both eyes daily. 5 mL 0   gabapentin (NEURONTIN) 300 MG capsule Take 1 capsule (300 mg total) by mouth 3 (three) times daily. 90 capsule 1   [START ON 08/31/2022] hydrochlorothiazide (HYDRODIURIL) 50 MG tablet Take 1 tablet (50 mg total) by mouth daily. 30 tablet 1   lisinopril (ZESTRIL) 40 MG tablet  Take 1 tablet (40 mg total) by mouth daily. 30 tablet 1   naltrexone (DEPADE) 50 MG tablet Take 1 tablet (50 mg total) by mouth at bedtime. 30 tablet 1   ROCKLATAN 0.02-0.005 % SOLN Place 1 drop into the right eye at bedtime. 2.5 mL 0   [START ON 08/31/2022] sertraline (ZOLOFT) 50 MG tablet Take 1 tablet (50 mg total) by mouth daily. 30 tablet 1    PTA Medications: (Not in a hospital admission)      08/25/2022    7:08 PM 08/23/2022    1:27 PM 08/23/2022   12:06 PM  Depression screen PHQ 2/9  Decreased Interest 0 1 1  Down, Depressed, Hopeless 0 0 1  PHQ - 2 Score 0 1 2  Altered sleeping 0  0  Tired, decreased energy 0  1  Change in appetite 0  0  Feeling bad or failure about yourself  0  1  Trouble concentrating 0  2  Moving slowly or fidgety/restless 0  0  Suicidal thoughts 0  0  PHQ-9 Score 0  6  Difficult doing work/chores   Somewhat difficult    Flowsheet Row ED from 08/23/2022 in [facility] ED from 10/10/2021 in [place] ED from 06/27/2021 in [place] No Risk No Risk No Risk       Musculoskeletal  Strength & Muscle Tone: within normal limits Gait & Station: normal Patient leans: [name]  Psychiatric Specialty Exam  Presentation  General Appearance:  Appropriate for Environment; Casual  Eye Contact: Fair  Speech: Clear and Coherent; Normal Rate  Speech Volume: Normal  Handedness: Right   Mood and Affect  Mood: Euthymic  Affect: Appropriate   Thought Process  Thought Processes: Coherent  Descriptions of  Associations:Intact  Orientation:Full (Time, Place and Person)  Thought Content:Logical  Diagnosis of Schizophrenia or Schizoaffective disorder in past: No    Hallucinations:Hallucinations: None  Ideas of Reference:None  Suicidal Thoughts:Suicidal Thoughts: No  Homicidal Thoughts:Homicidal Thoughts: No   Sensorium  Memory: Immediate Good  Judgment: Good  Insight: Good   Executive Functions  Concentration: Good  Attention Span: Good  Recall: Good  Fund of Knowledge: Good  Language: Good   Psychomotor Activity  Psychomotor Activity: Psychomotor Activity: Normal   Assets  Assets: Communication Skills; Desire for Improvement; Resilience   Sleep  Sleep: Sleep: Good   No data recorded  Physical Exam  Physical Exam Cardiovascular:     Rate and Rhythm: Normal rate.  Pulmonary:     Effort: Pulmonary effort is normal.  Neurological:     Mental Status: He is alert and oriented to person, place, and time.  Psychiatric:        Attention and Perception: Attention and perception normal.        Mood and Affect: Mood and affect normal.        Speech: Speech normal.        Behavior: Behavior normal. Behavior is cooperative.        Thought Content: Thought content normal.    Review of Systems  Constitutional:  Negative for chills and fever.  Respiratory:  Negative for shortness of breath.   Cardiovascular:  Negative for chest pain and palpitations.  Gastrointestinal:  Negative for abdominal pain, constipation, diarrhea, nausea and vomiting.  Neurological:  Negative for dizziness, weakness and headaches.   Blood pressure 116/75, pulse 72, temperature 98 F (36.7 C), temperature source  Oral, resp. rate 18, SpO2 99 %. There is no height or weight on file to calculate BMI.  Demographic Factors:  Male  Loss Factors: Financial problems/change in socioeconomic status  Historical Factors: NA  Risk Reduction Factors:   Future thinking  Continued  Clinical Symptoms:  Medical Diagnoses and Treatments/Surgeries  Cognitive Features That Contribute To Risk:  None    Suicide Risk:  Minimal: No identifiable suicidal ideation.  Patients presenting with no risk factors but with morbid ruminations; may be classified as minimal risk based on the severity of the depressive symptoms  Plan Of Care/Follow-up recommendations:  Discharge to [facility] Follow up with outpatient medication management and counseling Follow up with established primary care and specialty services  Patient is instructed prior to discharge to: Take all medications as prescribed by his/her mental healthcare provider. Report any adverse effects and or reactions from the medicines to his/her outpatient provider promptly. Keep all scheduled appointments, to ensure that you are getting refills on time and to avoid any interruption in your medication.  If you are unable to keep an appointment call to reschedule.  Be sure to follow-up with resources and follow-up appointments provided.  Patient has been instructed & cautioned: To not engage in alcohol and or illegal drug use while on prescription medicines. In the event of worsening symptoms, patient is instructed to call the crisis hotline, 911 and or go to the nearest ED for appropriate evaluation and treatment of symptoms. To follow-up with his/her primary care provider for your other medical issues, concerns and or health care needs.   Disposition:  Discharge to [facility], NP 08/30/2022, 3:24 PM

## 2022-08-31 DIAGNOSIS — F1910 Other psychoactive substance abuse, uncomplicated: Secondary | ICD-10-CM | POA: Diagnosis not present

## 2022-08-31 DIAGNOSIS — H409 Unspecified glaucoma: Secondary | ICD-10-CM | POA: Diagnosis not present

## 2022-08-31 DIAGNOSIS — I10 Essential (primary) hypertension: Secondary | ICD-10-CM | POA: Diagnosis not present

## 2022-08-31 DIAGNOSIS — Z20822 Contact with and (suspected) exposure to covid-19: Secondary | ICD-10-CM | POA: Diagnosis not present

## 2022-08-31 NOTE — ED Notes (Signed)
PHQ-9 and computer survey filled out w/assistance from this nurse.

## 2022-08-31 NOTE — ED Notes (Signed)
Patient was discharged to [facility]. Patient was transported by [facility]. Patient was sent with a 30 day supply of medications. Patient was given his belongings out of locker.

## 2022-08-31 NOTE — ED Notes (Signed)
Patient resting quietly in bed with eyes closed, Respirations equal and unlabored, skin warm and dry, NAD. Routine safety checks conducted according to facility protocol. Will continue to monitor for safety. 

## 2022-10-04 ENCOUNTER — Telehealth: Payer: Self-pay

## 2022-10-04 DIAGNOSIS — N529 Male erectile dysfunction, unspecified: Secondary | ICD-10-CM

## 2022-10-04 MED ORDER — SILDENAFIL CITRATE 20 MG PO TABS: 40.0000 mg | ORAL_TABLET | Freq: Every day | ORAL | 2 refills | Status: DC | PRN

## 2022-10-04 NOTE — Telephone Encounter (Signed)
Requesting refill on sildenafil @ [address] - [place], [place] - [address]

## 2022-10-04 NOTE — Telephone Encounter (Signed)
Done

## 2022-10-09 ENCOUNTER — Encounter: Payer: Self-pay | Admitting: *Deleted

## 2022-10-09 NOTE — Congregational Nurse Program (Signed)
  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 Stopped in 2010   Tobacco use disorder 10/07/2014    Encounter Details:  CNP Questionnaire - 10/09/22 1453       Questionnaire   Ask client: Do you give verbal consent for me to treat you today? Yes    Student Assistance [name]    Location Patient Served  [facility]    Visit Setting with Client Organization;Phone/Text/Email    Patient Status Unknown   staying in pallet housing/winter shelter   Insurance Medicaid    Insurance/Financial Assistance Referral [name]    Medication [name]    Medical Provider Yes    Screening Referrals Made [name]    Medical Referrals Made [name]    Medical Appointment Made [name]    Recently w/o PCP, now 1st time PCP visit completed due to CNs referral or appointment made [name]    Food [name]    Transportation [name]    Housing/Utilities No permanent housing    Interpersonal Safety [name]    Interventions Advocate/Support    Abnormal to Normal Screening Since Last CN Visit [name]    Screenings CN Performed [name]    Sent Client to Lab for: [name]    Did client attend any of the following based off CNs referral or appointments made? [name]    ED Visit Averted [name]    Life-Saving Intervention Made [name]           Client came to nurse's office asking about when his upcoming appointments are for [facility] and [facility]. Contacted [facility]. Client has an appt with them 10/11/22 at 8:45. He has an appt with [facility] 10/13/22 at 8:45. Client is currently staying in Pallet housing with  [facility]. He is being assisted by SW Katrina with [facility].   Jeronica Stlouis W RN CN

## 2022-10-11 ENCOUNTER — Other Ambulatory Visit: Payer: Self-pay

## 2022-10-11 ENCOUNTER — Encounter: Payer: Self-pay | Admitting: Internal Medicine

## 2022-10-11 ENCOUNTER — Telehealth: Payer: Self-pay

## 2022-10-11 ENCOUNTER — Ambulatory Visit (INDEPENDENT_AMBULATORY_CARE_PROVIDER_SITE_OTHER): Payer: Medicaid Other | Admitting: Internal Medicine

## 2022-10-11 VITALS — BP 152/79 | HR 88 | Temp 97.8°F | Ht 68.0 in | Wt 213.3 lb

## 2022-10-11 DIAGNOSIS — I10 Essential (primary) hypertension: Secondary | ICD-10-CM | POA: Diagnosis not present

## 2022-10-11 DIAGNOSIS — Z87891 Personal history of nicotine dependence: Secondary | ICD-10-CM

## 2022-10-11 DIAGNOSIS — E1136 Type 2 diabetes mellitus with diabetic cataract: Secondary | ICD-10-CM

## 2022-10-11 DIAGNOSIS — E1142 Type 2 diabetes mellitus with diabetic polyneuropathy: Secondary | ICD-10-CM

## 2022-10-11 DIAGNOSIS — Z23 Encounter for immunization: Secondary | ICD-10-CM

## 2022-10-11 DIAGNOSIS — E785 Hyperlipidemia, unspecified: Secondary | ICD-10-CM

## 2022-10-11 DIAGNOSIS — F1020 Alcohol dependence, uncomplicated: Secondary | ICD-10-CM | POA: Diagnosis not present

## 2022-10-11 DIAGNOSIS — F4323 Adjustment disorder with mixed anxiety and depressed mood: Secondary | ICD-10-CM | POA: Diagnosis not present

## 2022-10-11 DIAGNOSIS — G6289 Other specified polyneuropathies: Secondary | ICD-10-CM

## 2022-10-11 LAB — GLUCOSE, CAPILLARY: Glucose-Capillary: 232 mg/dL — ABNORMAL HIGH (ref 70–99)

## 2022-10-11 LAB — POCT GLYCOSYLATED HEMOGLOBIN (HGB A1C): Hemoglobin A1C: 10 % — AB (ref 4.0–5.6)

## 2022-10-11 MED ORDER — GABAPENTIN 300 MG PO CAPS: 300.0000 mg | ORAL_CAPSULE | Freq: Three times a day (TID) | ORAL | 5 refills | Status: DC

## 2022-10-11 MED ORDER — LISINOPRIL 40 MG PO TABS: 40.0000 mg | ORAL_TABLET | Freq: Every day | ORAL | 11 refills | Status: DC

## 2022-10-11 MED ORDER — EMPAGLIFLOZIN-METFORMIN HCL 5-500 MG PO TABS: 1.0000 | ORAL_TABLET | Freq: Two times a day (BID) | ORAL | 2 refills | Status: DC

## 2022-10-11 MED ORDER — SERTRALINE HCL 50 MG PO TABS: 50.0000 mg | ORAL_TABLET | Freq: Every day | ORAL | 5 refills | Status: DC

## 2022-10-11 MED ORDER — ATORVASTATIN CALCIUM 80 MG PO TABS: 80.0000 mg | ORAL_TABLET | Freq: Every day | ORAL | 11 refills | Status: DC

## 2022-10-11 NOTE — Telephone Encounter (Signed)
Prior Authorization for patient (synjardy) came through on cover my meds was submitted with last office notes.[name] Key: [id number] - PA Case ID: [id number] - Rx #: [id number] Need help? Call us at [phone] Outcome Approvedtoday PA Case: [id number], Status: Approved, Coverage Starts on: 10/11/2022 12:00:00 AM, Coverage Ends on: 10/11/2023 12:00:00 AM. Drug Synjardy 5-500MG  tablets Form [facility] Electronic [place] Form [phone] NCPDP) Original Claim Info 70

## 2022-10-11 NOTE — Assessment & Plan Note (Signed)
Restart Lipitor 80 mg daily.  Recheck lipid panel at follow-up if he is consistently taking this medication.

## 2022-10-11 NOTE — Assessment & Plan Note (Signed)
Was recently hospitalized at behavioral health. Underwent detox for alcohol and cocaine and was discharged to [facility] rehab. However he tells me he was turned away from [facility] due to his hyperglycemia, was told to come back when his blood sugar was better. Since his discharge, he is now drinking 40 oz beer / day and not taking any of his medications.  He is interested in checking himself into [facility] rehab if he is able to get his diabetes under better control.  I continued to encourage cessation, he is not interested in restarting naltrexone.

## 2022-10-11 NOTE — Assessment & Plan Note (Signed)
Uncontrolled, hemoglobin A1c today is 10.0.  He has not been taking metformin.  Plan to start empagliflozin-metformin 5-500 mg twice daily.  Uptitrate to goal hemoglobin A1c less than 7.

## 2022-10-11 NOTE — Progress Notes (Signed)
Subjective:   Patient ID: Brady Church male   DOB: February 08, 1961 61 y.o.   MRN: [id number]  HPI: Mr.Brady Church is a 61 y.o. male with past medical history outlined below here for follow up of HTN and diabetes. For the details of today's visit, please refer to the assessment and plan below.  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 Stopped in 2010   Tobacco use disorder 10/07/2014   Current Outpatient Medications  Medication Sig Dispense Refill   Empagliflozin-metFORMIN HCl 5-500 MG TABS Take 1 tablet by mouth 2 (two) times daily. 60 tablet 2   atorvastatin (LIPITOR) 80 MG tablet Take 1 tablet (80 mg total) by mouth daily at 6 PM. 30 tablet 11   brimonidine (ALPHAGAN) 0.15 % ophthalmic solution Place 1 drop into both eyes 2 (two) times daily. 5 mL 0   dorzolamide-timolol (COSOPT) 22.3-6.8 MG/ML ophthalmic solution Place 1 drop into the right eye 2 (two) times daily. 10 mL 0   fluorometholone (FML) 0.1 % ophthalmic suspension Place 1 drop into both eyes daily. 5 mL 0   gabapentin (NEURONTIN) 300 MG capsule Take 1 capsule (300 mg total) by mouth 3 (three) times daily. 90 capsule 5   lisinopril (ZESTRIL) 40 MG tablet Take 1 tablet (40 mg total) by mouth daily. 30 tablet 11   ROCKLATAN 0.02-0.005 % SOLN Place 1 drop into the right eye at bedtime. 2.5 mL 0   sertraline (ZOLOFT) 50 MG tablet Take 1 tablet (50 mg total) by mouth daily. 30 tablet 5   sildenafil (REVATIO) 20 MG tablet Take 2-5 tablets (40-100 mg total) by mouth daily as needed (prior to sexual activity). 30 tablet 2   No current facility-administered medications for this visit.   Family History   Problem Relation Age of Onset   Coronary artery disease Mother 52       died of MI   Cancer Father 65       Some GI cancer. Not sure if it was Colon Cancer or not.   Hypertension Father    Diabetes Maternal Grandmother    Social History   Socioeconomic History   Marital status: Single    Spouse name: Not on file   Number of children: Not on file   Years of education: 12   Highest education level: Not on file  Occupational History   Not on file  Tobacco Use   Smoking status: Former    Packs/day: 0.50    Types: Cigarettes    Quit date: 02/18/2017    Years since quitting: 5.6   Smokeless tobacco: Never  Substance and Sexual Activity   Alcohol use: Yes    Comment: Beer- 40 oz. every day.   Drug use: Not Currently    Types: Cocaine, Marijuana    Comment: Sometimes.   Sexual activity: Not on file  Other Topics Concern   Not on file  Social History Narrative   Lives in [place] for last 10 years with "friend"  [name]      Is not married and does not have kids.   Working sporadically as a  b[employer].    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   Social Determinants of Health   Financial Resource Strain: High Risk (10/11/2022)   Overall Financial Resource Strain (CARDIA)    Difficulty of Paying Living Expenses: Very hard  Food Insecurity: Food Insecurity Present (10/11/2022)   Hunger Vital Sign    Worried About Running Out of Food in the Last Year: Often true    Ran Out of Food in the Last Year: Often true  Transportation Needs: Unmet Transportation Needs (10/11/2022)   PRAPARE - Administrator, Civil Service (Medical): Yes    Lack of Transportation (Non-Medical): Yes  Physical Activity: Not on file  Stress: Not on file  Social Connections: Socially Isolated (10/11/2022)   Social Connection and Isolation Panel [NHANES]    Frequency of Communication with Friends and Family: Never    Frequency of Social Gatherings with  Friends and Family: Never    Attends Religious Services: Never    Database administrator or Organizations: No    Attends Engineer, structural: Never    Marital Status: Never married     Objective:  Physical Exam:  Vitals:   10/11/22 0857  BP: (!) 152/79  Pulse: 88  Temp: 97.8 F (36.6 C)  TempSrc: Oral  SpO2: 100%  Weight: 213 lb 4.8 oz (96.8 kg)  Height: 5\' 8"  (1.727 m)    Constitutional: NAD  Cardiovascular: RRR, no m/r/g Pulmonary/Chest: Clear bilaterally, normal effort Extremities: Feet with 2+ pulses bilaterally, few calluses, thickening of all toenails with discoloration  Skin:    Assessment & Plan:   Alcohol use disorder, severe, dependence (HCC) Was recently hospitalized at behavioral health. Underwent detox for alcohol and cocaine and was discharged to [facility] rehab. However he tells me he was turned away from [facility] due to his hyperglycemia, was told to come back when his blood sugar was better. Since his discharge, he is now drinking 40 oz beer / day and not taking any of his medications.  He is interested in checking himself into [facility] rehab if he is able to get his diabetes under better control.  I continued to encourage cessation, he is not interested in restarting naltrexone.  Adjustment disorder with mixed anxiety and depressed mood I have restarted his Zoloft 50 mg daily.  Follow-up in 4 weeks.  Hyperlipidemia LDL goal <70 Restart Lipitor 80 mg daily.  Recheck lipid panel at follow-up if he is consistently taking this medication.  Hypertension Uncontrolled, currently off his antihypertensives.  He does not like taking hydrochlorothiazide because it causes him to urinate frequently.  Blood pressure today is 152/79.  Plan to restart his lisinopril 40 mg daily with close follow-up.  If still uncontrolled at follow-up, consider adding amlodipine instead of HCTZ.  May benefit from combination pills to help with his compliance.  Peripheral  neuropathy Refilled gabapentin 300 mg TID.  This is the only medication he is currently taking, says it is helping with his neuropathy.  Diabetes mellitus with diabetic cataract (HCC) Uncontrolled, hemoglobin A1c today is 10.0.  He has not been taking metformin.  Plan to start empagliflozin-metformin 5-500 mg twice daily.  Uptitrate to goal hemoglobin A1c less than 7.

## 2022-10-11 NOTE — Assessment & Plan Note (Signed)
I have restarted his Zoloft 50 mg daily.  Follow-up in 4 weeks.

## 2022-10-11 NOTE — Assessment & Plan Note (Signed)
Refilled gabapentin 300 mg TID.  This is the only medication he is currently taking, says it is helping with his neuropathy.

## 2022-10-11 NOTE — Assessment & Plan Note (Signed)
Uncontrolled, currently off his antihypertensives.  He does not like taking hydrochlorothiazide because it causes him to urinate frequently.  Blood pressure today is 152/79.  Plan to restart his lisinopril 40 mg daily with close follow-up.  If still uncontrolled at follow-up, consider adding amlodipine instead of HCTZ.  May benefit from combination pills to help with his compliance.

## 2022-10-11 NOTE — Patient Instructions (Addendum)
Mr. Nieto,  It was a pleasure to see you. Today we discussed:  Diabetes: I have started you on a medicaion called Synjardy, which contains two medications (metformin and empagliflozin). Please take 1 tablet twice a day.  Blood Pressure: Please restart lisinopril and follow up with me again in 1 month.  Cholesterol: Please restart your atorvastatin   Depression / Anxiety: I sent refills of your Zoloft to your pharmacy. Please take this once a day.  If you have any questions or concerns, call our clinic at [phone] or after hours call [phone] and ask for the internal medicine resident on call.   Thank you  Dr. Reece Agar

## 2022-10-13 ENCOUNTER — Encounter: Payer: Self-pay | Admitting: Podiatry

## 2022-10-13 ENCOUNTER — Ambulatory Visit (INDEPENDENT_AMBULATORY_CARE_PROVIDER_SITE_OTHER): Payer: Medicaid Other | Admitting: Podiatry

## 2022-10-13 DIAGNOSIS — B353 Tinea pedis: Secondary | ICD-10-CM

## 2022-10-13 DIAGNOSIS — E1142 Type 2 diabetes mellitus with diabetic polyneuropathy: Secondary | ICD-10-CM | POA: Diagnosis not present

## 2022-10-13 DIAGNOSIS — L84 Corns and callosities: Secondary | ICD-10-CM | POA: Diagnosis not present

## 2022-10-13 DIAGNOSIS — B351 Tinea unguium: Secondary | ICD-10-CM

## 2022-10-13 DIAGNOSIS — L853 Xerosis cutis: Secondary | ICD-10-CM | POA: Diagnosis not present

## 2022-10-13 DIAGNOSIS — M79676 Pain in unspecified toe(s): Secondary | ICD-10-CM | POA: Diagnosis not present

## 2022-10-13 MED ORDER — AMMONIUM LACTATE 12 % EX LOTN: 1.0000 | TOPICAL_LOTION | CUTANEOUS | 5 refills | Status: DC | PRN

## 2022-10-13 MED ORDER — KETOCONAZOLE 2 % EX CREA: TOPICAL_CREAM | CUTANEOUS | 1 refills | Status: DC

## 2022-10-13 NOTE — Patient Instructions (Signed)
To prevent rubbing on both 2nd toes, apply tube foam to both great toes every morning. Remove every evening.   To prevent reinfection of Athlete's Feet, spray shoes with lysol every evening. Clean tub or shower with bleach based cleanser.   Athlete's Foot Athlete's foot (tinea pedis) is a fungal infection of the skin on your feet. It often occurs on the skin that is between or underneath the toes. It can also occur on the soles of your feet. The infection can spread from person to person (is contagious). It can also spread when a person's bare feet come in contact with the fungus on shower floors or on items such as shoes. What are the causes? This condition is caused by a fungus that grows in warm, moist places. You can get athlete's foot by sharing shoes, shower stalls, towels, and wet floors with someone who is infected. Not washing your feet or changing your socks often enough can also lead to athlete's foot. What increases the risk? This condition is more likely to develop in: Men. People who have a weak body defense system (immune system). People who have diabetes. People who use public showers, such as at a gym. People who wear heavy-duty shoes, such as Youth worker. Seasons with warm, humid weather. What are the signs or symptoms? Symptoms of this condition include: Itchy areas between your toes or on the soles of your feet. White, flaky, or scaly areas between your toes or on the soles of your feet. Very itchy small blisters between your toes or on the soles of your feet. Small cuts in your skin. These cuts can become infected. Thick or discolored toenails. How is this diagnosed? This condition may be diagnosed with a physical exam and a review of your medical history. Your health care provider may also take a skin or toenail sample to examine under a microscope. How is this treated? This condition is treated with antifungal medicines. These may be applied as  powders, ointments, or creams. In severe cases, an oral antifungal medicine may be given. Follow these instructions at home: Medicines Apply or take over-the-counter and prescription medicines only as told by your health care provider. Apply your antifungal medicine as told by your health care provider. Do not stop using the antifungal even if your condition improves. Foot care Do not scratch your feet. Keep your feet dry: Wear cotton or wool socks. Change your socks every day or if they become wet. Wear shoes that allow air to flow, such as sandals or canvas tennis shoes. Wash and dry your feet, including the area between your toes. Also, wash and dry your feet: Every day or as told by your health care provider. After exercising. General instructions Do not let others use towels, shoes, nail clippers, or other personal items that touch your feet. Protect your feet by wearing sandals in wet areas, such as locker rooms and shared showers. Keep all follow-up visits. This is important. If you have diabetes, keep your blood sugar under control. Contact a health care provider if: You have a fever. You have swelling, soreness, warmth, or redness in your foot. Your feet are not getting better with treatment. Your symptoms get worse. You have new symptoms. You have severe pain. Summary Athlete's foot (tinea pedis) is a fungal infection of the skin on your feet. It often occurs on skin that is between or underneath the toes. This condition is caused by a fungus that grows in warm, moist places. Symptoms  include white, flaky, or scaly areas between your toes or on the soles of your feet. This condition is treated with antifungal medicines. Keep your feet clean. Always dry them thoroughly. This information is not intended to replace advice given to you by your health care provider. Make sure you discuss any questions you have with your health care provider. Document Revised: 03/06/2021 Document  Reviewed: 03/06/2021 Elsevier Patient Education  2023 [employer].

## 2022-10-16 ENCOUNTER — Encounter: Payer: Self-pay | Admitting: *Deleted

## 2022-10-16 DIAGNOSIS — Z139 Encounter for screening, unspecified: Secondary | ICD-10-CM

## 2022-10-16 LAB — GLUCOSE, POCT (MANUAL RESULT ENTRY): POC Glucose: 247 mg/dl — AB (ref 70–99)

## 2022-10-16 NOTE — Congregational Nurse Program (Signed)
  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 Stopped in 2010   Tobacco use disorder 10/07/2014    Encounter Details:  CNP Questionnaire - 10/16/22 1332       Questionnaire   Ask client: Do you give verbal consent for me to treat you today? Yes    Student Assistance [name]    Location Patient Served  [facility]    Visit Setting with Client Organization    Patient Status Unknown    Insurance Medicaid    Insurance/Financial Assistance Referral [name]    Medication [name]    Medical Provider Yes    Screening Referrals Made [name]    Medical Referrals Made [name]    Medical Appointment Made [name]    Recently w/o PCP, now 1st time PCP visit completed due to CNs referral or appointment made [name]    Food [name]    Transportation [name]    Housing/Utilities No permanent housing    Interpersonal Safety [name]    Interventions Advocate/Support;Educate    Abnormal to Normal Screening Since Last CN Visit [name]    Screenings CN Performed Blood Pressure;Blood Glucose    Sent Client to Lab for: [name]    Did client attend any of the following based off CNs referral or appointments made? [name]    ED Visit Averted [name]    Life-Saving Intervention Made [name]            Client seen at [facility] and came in nurse's office for blood pressure and blood sugar check. Blood pressure (!) 163/104, pulse 69. CBG 247. Client reports he has not taken his medication in several weeks. He denies symptoms or distress. Educated client on the importance of controlling both blood pressure and blood sugar. Offered  support and encouragement. Client says he is having a friend take him to get his medication today. Finian Helvey W RN CN

## 2022-10-18 NOTE — Progress Notes (Signed)
Subjective:  Patient ID: Brady Church, male    DOB: March 14, 1961,  MRN: [id number]  Brady Church presents to clinic today for at risk foot care with history of diabetic neuropathy and corn(s) b/l lower extremities, callus(es) b/l lower extremities and painful mycotic nails.  Pain interferes with ambulation. Aggravating factors include wearing enclosed shoe gear. Painful toenails interfere with ambulation. Aggravating factors include wearing enclosed shoe gear. Pain is relieved with periodic professional debridement. Painful corns and calluses are aggravated when weightbearing with and without shoegear. Pain is relieved with periodic professional debridement.  Chief Complaint  Patient presents with   Nail Problem    Diabetic foot care BS-226 Brady Church A1C-10.1 PCP-Carolyn Guilloud PCP VST-10/11/2022   New problem(s):  Patient relates scaling on both feet. Also would like to discuss treatment for onychomycosis.  PCP is [name], MD.  No Known Allergies  Review of Systems: Negative except as noted in the HPI.  Objective: No changes noted in today's physical examination.  Brady Church is a pleasant 61 y.o. male obese in NAD. AAO x 3. Vascular Capillary fill time to digits immediate b/l.  DP/PT pulse(s) are palpable b/l lower extremities. Pedal hair sparse. Lower extremity skin temperature gradient within normal limits. No pain with calf compression b/l. No edema noted b/l lower extremities. No cyanosis or clubbing noted.   Neurologic Protective sensation diminished with 10g monofilament b/l.  Dermatologic Pedal skin is warm and supple b/l.  No open wounds b/l lower extremities. No interdigital macerations b/l lower extremities. Toenails 1-5 b/l elongated, discolored, dystrophic, thickened, Church with subungual debris and tenderness to dorsal palpation.   Hyperkeratotic lesion(s) medial IPJ of left great toe, medial IPJ of right great toe, and medial DIPJ of bilateral 2nd  toes.  No erythema, no edema, no drainage, no fluctuance.   Orthopedic: Normal muscle strength 5/5 to all lower extremity muscle groups bilaterally. Patient ambulates independent of any assistive aids. HAV with bunion deformity noted b/l LE. Hammertoe(s) noted to the bilateral 2nd toes. 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 Uses white cane for sight impairment.   Assessment/Plan: 1. Pain due to onychomycosis of toenail   2. Xerosis cutis   3. Tinea pedis of both feet   4. Corns and callosities   5. Diabetic peripheral neuropathy associated with type 2 diabetes mellitus (HCC)     Meds ordered this encounter  Medications   ketoconazole (NIZORAL) 2 % cream    Sig: Apply to both feet and between toes once daily for 6 weeks.    Dispense:  60 g    Refill:  1   ammonium lactate (LAC-HYDRIN) 12 % lotion    Sig: Apply 1 Application topically as needed for dry skin.    Dispense:  400 g    Refill:  5    -Consent given for treatment as described below: -Medicaid ABN on file for paring of corn(s)/callus(es)/porokeratos(es). Copy in patient chart. -Patient to continue soft, supportive shoe gear daily. -Mycotic toenails 1-5 bilaterally were debrided in length and girth with sterile nail nippers and dremel without incident. -Corn(s) bilateral 2nd toes and callus(es) bilateral great toes were pared utilizing sterile scalpel blade without incident. Total number debrided =4. Dispensed tube foam to both great toes every morning. Remove every evening. -For xerosis, Rx sent for Ammonium Lactate Lotion 12%. Apply to feet twice daily avoiding application between toes. -For tinea pedis, Rx sent to pharmacy for Ketoconazole Cream 2% to be applied once daily for six weeks. -Discussed treatment  options for onychomycosis. He is to apply Vick's Vapor Rub to each toenail once daily with cotton tipped applicator. Patient related understanding. -Patient/POA to call should there be  question/concern in the interim.   Return in about 3 months (around 01/13/2023).  Marzetta Board, DPM

## 2022-11-08 ENCOUNTER — Encounter: Payer: Self-pay | Admitting: *Deleted

## 2022-11-08 DIAGNOSIS — Z139 Encounter for screening, unspecified: Secondary | ICD-10-CM

## 2022-11-08 LAB — GLUCOSE, POCT (MANUAL RESULT ENTRY): POC Glucose: 212 mg/dl — AB (ref 70–99)

## 2022-11-08 NOTE — Congregational Nurse Program (Signed)
  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 Stopped in 2010   Tobacco use disorder 10/07/2014    Encounter Details:  CNP Questionnaire - 11/08/22 1053       Questionnaire   Ask client: Do you give verbal consent for me to treat you today? Yes    Student Assistance [name]    Location Patient Served  [facility]    Visit Setting with Client Organization    Patient Status Unknown   pallet homes   Insurance Medicaid    Insurance/Financial Assistance Referral [name]    Medication [name]    Medical Provider Yes    Screening Referrals Made [name]    Medical Referrals Made [name]    Medical Appointment Made [name]    Recently w/o PCP, now 1st time PCP visit completed due to CNs referral or appointment made [name]    Food [name]    Transportation [name]    Housing/Utilities No permanent housing    Interpersonal Safety [name]    Interventions Advocate/Support;Educate    Abnormal to Normal Screening Since Last CN Visit [name]    Screenings CN Performed Blood Pressure;Blood Glucose    Sent Client to Lab for: [name]    Did client attend any of the following based off CNs referral or appointments made? [name]    ED Visit Averted [name]    Life-Saving Intervention Made [name]            Client came to nurse's office for a blood pressure and blood sugar check. Blood pressure 130/83, pulse 72. CBG 212. Client reports he has not been taking his metformin as ordered. Talked with client about importance of taking medication as prescribed and discussed barriers to taking his medication.  Sherwin Hollingshed W RN  CN

## 2022-11-13 ENCOUNTER — Encounter: Payer: Self-pay | Admitting: *Deleted

## 2022-11-13 DIAGNOSIS — Z139 Encounter for screening, unspecified: Secondary | ICD-10-CM

## 2022-11-13 LAB — GLUCOSE, POCT (MANUAL RESULT ENTRY): POC Glucose: 254 mg/dl — AB (ref 70–99)

## 2022-11-13 NOTE — Congregational Nurse Program (Signed)
  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 Stopped in 2010   Tobacco use disorder 10/07/2014    Encounter Details:  CNP Questionnaire - 11/13/22 1031       Questionnaire   Ask client: Do you give verbal consent for me to treat you today? Yes    Student Assistance [name]    Location Patient Served  [facility]    Visit Setting with Client Organization    Patient Status Unknown   pallet homes   Insurance Medicaid    Insurance/Financial Assistance Referral [name]    Medication [name]    Medical Provider Yes    Screening Referrals Made [name]    Medical Referrals Made [name]    Medical Appointment Made [name]    Recently w/o PCP, now 1st time PCP visit completed due to CNs referral or appointment made [name]    Food [name]    Transportation [name]    Housing/Utilities No permanent housing   staying in pallet homes   Interpersonal Safety [name]    Interventions Advocate/Support;Educate    Abnormal to Normal Screening Since Last CN Visit [name]    Screenings CN Performed Blood Pressure;Blood Glucose    Sent Client to Lab for: [name]    Did client attend any of the following based off CNs referral or appointments made? [name]    ED Visit Averted [name]    Life-Saving Intervention Made [name]            Client came to nurse's office for a blood pressure and blood sugar check. Blood pressure 137/77, pulse 99. CBG 254. Client ate a granola bar last night and this morning. Educated client on food choices for people with diabetes.  Brady Church W RN CN

## 2022-11-15 ENCOUNTER — Encounter: Payer: Self-pay | Admitting: *Deleted

## 2022-11-15 DIAGNOSIS — Z139 Encounter for screening, unspecified: Secondary | ICD-10-CM

## 2022-11-15 LAB — GLUCOSE, POCT (MANUAL RESULT ENTRY): POC Glucose: 210 mg/dl — AB (ref 70–99)

## 2022-11-15 NOTE — Congregational Nurse Program (Signed)
  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 Stopped in 2010   Tobacco use disorder 10/07/2014    Encounter Details:  CNP Questionnaire - 11/15/22 0900       Questionnaire   Ask client: Do you give verbal consent for me to treat you today? Yes    Student Assistance [name]    Location Patient Served  [facility]    Visit Setting with Client Organization    Patient Status Unknown   pallet homes   Insurance Medicaid    Insurance/Financial Assistance Referral [name]    Medication [name]    Medical Provider Yes    Screening Referrals Made [name]    Medical Referrals Made [name]    Medical Appointment Made [name]    Recently w/o PCP, now 1st time PCP visit completed due to CNs referral or appointment made [name]    Food [name]    Transportation [name]    Housing/Utilities No permanent housing   staying in pallet homes   Interpersonal Safety [name]    Interventions Advocate/Support;Educate    Abnormal to Normal Screening Since Last CN Visit [name]    Screenings CN Performed Blood Pressure;Blood Glucose    Sent Client to Lab for: [name]    Did client attend any of the following based off CNs referral or appointments made? [name]    ED Visit Averted [name]    Life-Saving Intervention Made [name]            Client came to nurse's office for a blood pressure and blood sugar check. Blood pressure (!) 140/85, pulse 93. Continue to offer support and encouragement for better food choices and take medication for his HTN and diabetes. Nhat Hearne W RN CN

## 2022-11-29 ENCOUNTER — Telehealth: Payer: Self-pay | Admitting: Internal Medicine

## 2022-11-29 NOTE — Telephone Encounter (Signed)
#  30 with 2 refills sent 10/04/22. Called patient to see if he obtained both refills, however, number listed is not patient's current phone number. Call placed to Adrian at [facility]. States he still has 2 refills and she will get one ready now.

## 2022-11-29 NOTE — Telephone Encounter (Signed)
sildenafil (REVATIO) 20 MG tablet   [id number] - [place], [place] - [place]

## 2022-12-04 ENCOUNTER — Encounter: Payer: Self-pay | Admitting: *Deleted

## 2022-12-04 DIAGNOSIS — Z139 Encounter for screening, unspecified: Secondary | ICD-10-CM

## 2022-12-04 LAB — GLUCOSE, POCT (MANUAL RESULT ENTRY): POC Glucose: 231 mg/dl — AB (ref 70–99)

## 2022-12-04 NOTE — Congregational Nurse Program (Signed)
  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 Stopped in 2010   Tobacco use disorder 10/07/2014    Encounter Details:  CNP Questionnaire - 12/04/22 0927       Questionnaire   Ask client: Do you give verbal consent for me to treat you today? Yes    Student Assistance [name]    Location Patient Served  [facility]    Visit Setting with Client Organization    Patient Status Unknown    Insurance Medicaid    Insurance/Financial Assistance Referral [name]    Medication [name]    Medical Provider Yes    Screening Referrals Made [name]    Medical Referrals Made [name]    Medical Appointment Made [name]    Recently w/o PCP, now 1st time PCP visit completed due to CNs referral or appointment made [name]    Food [name]    Transportation [name]    Housing/Utilities No permanent housing    Interpersonal Safety [name]    Interventions Advocate/Support    Abnormal to Normal Screening Since Last CN Visit Blood Pressure    Screenings CN Performed Blood Pressure;Blood Glucose    Sent Client to Lab for: [name]    Did client attend any of the following based off CNs referral or appointments made? [name]    ED Visit Averted [name]    Life-Saving Intervention Made [name]           Client came to nurses office for blood pressure and cbg check. Blood pressure 129/82, pulse 88. CBG 231. Client reports he is eating less salt. Talked with client about foods for diabetes as well. Client reports he has not been using his eye drops. He reports he has an upcoming doctor's appt and plans to discuss eye drops with  MD as he feels they may need to be changed. Offered support and encouragement. Client is currently staying in [place] and does not have a telephone. He is working with [place] [facility]. Inola Lisle W RN CN

## 2022-12-13 ENCOUNTER — Encounter: Payer: Self-pay | Admitting: *Deleted

## 2022-12-13 ENCOUNTER — Encounter: Payer: Medicaid Other | Admitting: Internal Medicine

## 2022-12-13 DIAGNOSIS — Z139 Encounter for screening, unspecified: Secondary | ICD-10-CM

## 2022-12-13 LAB — GLUCOSE, POCT (MANUAL RESULT ENTRY): POC Glucose: 162 mg/dl — AB (ref 70–99)

## 2022-12-13 NOTE — Congregational Nurse Program (Signed)
  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 Stopped in 2010   Tobacco use disorder 10/07/2014    Encounter Details:  CNP Questionnaire - 12/13/22 0930       Questionnaire   Ask client: Do you give verbal consent for me to treat you today? Yes    Student Assistance [name]    Location Patient Served  [facility]    Visit Setting with Client Organization    Patient Status Unknown   [place] Medicaid    Insurance/Financial Assistance Referral [name]    Medication [name]    Medical Provider Yes    Screening Referrals Made [name]    Medical Referrals Made [name]    Medical Appointment Made [name]    Recently w/o PCP, now 1st time PCP visit completed due to CNs referral or appointment made [name]    Food [name]    Transportation Need transportation assistance;Provided transportation assistance    Housing/Utilities No permanent housing    Interpersonal Safety [name]    Interventions Advocate/Support;Spiritual Care    Abnormal to Normal Screening Since Last CN Visit [name]    Screenings CN Performed Blood Pressure;Blood Glucose    Sent Client to Lab for: [name]    Did client attend any of the following based off CNs referral or appointments made? [name]    ED Visit Averted [name]    Life-Saving Intervention Made [name]            Client came to nurse's office requesting blood pressure and blood sugar check. Blood pressure 129/83, pulse 74. CBG 162. Client requested a bus pass for bible study at [employer] Offered support and encouragement. Gave bus pass as requested.  Client reported he went to eye doctor this week and received four new eye drops. He has his medication.

## 2023-01-17 ENCOUNTER — Encounter: Payer: Self-pay | Admitting: Podiatry

## 2023-01-17 ENCOUNTER — Ambulatory Visit (INDEPENDENT_AMBULATORY_CARE_PROVIDER_SITE_OTHER): Payer: Medicaid Other | Admitting: Podiatry

## 2023-01-17 VITALS — BP 165/96

## 2023-01-17 DIAGNOSIS — M79676 Pain in unspecified toe(s): Secondary | ICD-10-CM

## 2023-01-17 DIAGNOSIS — E1142 Type 2 diabetes mellitus with diabetic polyneuropathy: Secondary | ICD-10-CM | POA: Diagnosis not present

## 2023-01-17 DIAGNOSIS — M2011 Hallux valgus (acquired), right foot: Secondary | ICD-10-CM | POA: Diagnosis not present

## 2023-01-17 DIAGNOSIS — E119 Type 2 diabetes mellitus without complications: Secondary | ICD-10-CM

## 2023-01-17 DIAGNOSIS — L84 Corns and callosities: Secondary | ICD-10-CM

## 2023-01-17 DIAGNOSIS — B351 Tinea unguium: Secondary | ICD-10-CM

## 2023-01-17 DIAGNOSIS — M2012 Hallux valgus (acquired), left foot: Secondary | ICD-10-CM

## 2023-01-17 NOTE — Progress Notes (Unsigned)
ANNUAL DIABETIC FOOT EXAM  Subjective: Brady Church presents today for annual diabetic foot examination.  Chief Complaint  Patient presents with   Nail Problem    DFC BS-did not check today A1C-10.1 PCP-[place], Hoyle Sauer PCP VST- "4 months ago"   Patient confirms h/o diabetes.  Patient relates {Numbers; 0-100:15068} year h/o diabetes.  Patient denies any h/o foot wounds.  Patient has h/o foot ulcer of {jgPodToeLocator:23637}, which healed via help of ***.  Patient has h/o amputation(s): {jgamp:23617}.  Patient endorses symptoms of foot numbness.   Patient endorses symptoms of foot tingling.  Patient endorses symptoms of burning in feet.  Patient endorses symptoms of pins/needles sensation in feet.  Patient denies any numbness, tingling, burning, or pins/needle sensation in feet.  Patient has been diagnosed with neuropathy and it is managed with {JGNEUROPATHYMEDS:27053}.  Risk factors: {jgriskfactors:24044}.  Brady Ochs, MD is patient's PCP. Last visit was {Time; dates multiple:15870}***.  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 Stopped in 2010   Tobacco use disorder 10/07/2014   Patient Active Problem List   Diagnosis Date Noted   Cannabis use disorder 08/24/2022   Housing instability 08/24/2022   Neuropathic pain of hand, right 01/11/2022   Syncope 10/12/2021   Bilateral foot pain 07/08/2021   Vitamin B12 deficiency 08/06/2019   Erectile dysfunction 05/06/2018   Post-nasal drip 10/01/2017   Peripheral neuropathy 08/06/2017   Adjustment disorder with mixed anxiety and depressed mood    CVA (cerebral vascular accident)  ([place]) 02/19/2017   Intracranial vascular stenosis 02/16/2017   History of substance abuse ([place]) 02/16/2017   Prolonged QT interval 02/16/2017   Cocaine use disorder, severe, dependence ([place])    Chronic combined systolic and diastolic congestive heart failure ([place]) 05/12/2015   Stable angina 01/07/2015   Alcohol use disorder, severe, dependence ([place]) 11/06/2014   History of tobacco use 10/07/2014   Healthcare maintenance 01/23/2014   Glaucoma 05/11/2012   Hyperlipidemia LDL goal <70 03/22/2012   Hypertension 01/17/2012   Diabetes mellitus with diabetic cataract ([place]) 03/08/2009   Past Surgical History:  Procedure Laterality Date   CATARACT EXTRACTION  01/08/2012   Right eye   EYE SURGERY     x 4 total    Current Outpatient Medications on File Prior to Visit  Medication Sig Dispense Refill   ammonium lactate (LAC-HYDRIN) 12 % lotion Apply 1 Application topically as needed for dry skin. 400 g 5   atorvastatin (LIPITOR) 80 MG tablet Take 1 tablet (80 mg total) by mouth daily at 6 PM. 30 tablet 11   brimonidine (ALPHAGAN) 0.15 % ophthalmic solution Place 1 drop into both eyes 2 (two) times daily. 5 mL 0   dorzolamide-timolol (COSOPT) 22.3-6.8 MG/ML ophthalmic solution Place 1 drop into the right eye 2 (two) times daily. 10 mL 0   Empagliflozin-metFORMIN HCl 5-500 MG TABS Take 1 tablet by mouth 2 (two) times daily. 60 tablet 2   fluorometholone (FML) 0.1 % ophthalmic suspension Place 1 drop into both eyes daily. 5 mL 0   gabapentin (NEURONTIN) 300 MG capsule Take 1 capsule (300 mg total) by mouth 3 (three) times daily.  90 capsule 5   ketoconazole (NIZORAL) 2 % cream Apply to both feet and between toes once daily for 6 weeks. 60 g 1   lisinopril (ZESTRIL) 40 MG tablet Take 1 tablet (40 mg total) by mouth daily. 30 tablet 11   ROCKLATAN 0.02-0.005 % SOLN Place 1 drop into the right eye at bedtime. 2.5 mL 0   sertraline (ZOLOFT) 50 MG tablet Take 1 tablet (50 mg total) by mouth daily. 30  tablet 5   sildenafil (REVATIO) 20 MG tablet Take 2-5 tablets (40-100 mg total) by mouth daily as needed (prior to sexual activity). 30 tablet 2   No current facility-administered medications on file prior to visit.    No Known Allergies Social History   Occupational History   Not on file  Tobacco Use   Smoking status: Former    Packs/day: 0.50    Types: Cigarettes    Quit date: 02/18/2017    Years since quitting: 5.9   Smokeless tobacco: Never  Substance and Sexual Activity   Alcohol use: Yes    Comment: Beer- 40 oz. every day.   Drug use: Not Currently    Types: Cocaine, Marijuana    Comment: Sometimes.   Sexual activity: Not on file   Family History  Problem Relation Age of Onset   Coronary artery disease Mother 70       died of MI   Cancer Father 71       Some GI cancer. Not sure if it was Colon Cancer or not.   Hypertension Father    Diabetes Maternal Grandmother    Immunization History  Administered Date(s) Administered   Influenza,inj,Quad PF,6+ Mos 01/07/2018, 11/05/2019, 08/11/2020, 08/24/2021, 10/11/2022   Pneumococcal Polysaccharide-23 06/04/2017   Tdap 07/05/2022     Review of Systems: Negative except as noted in the HPI.   Objective: Vitals:   01/17/23 0845  BP: (!) 165/96   Brady Church is a pleasant 62 y.o. male in NAD. AAO X 3.  Vascular Examination: CFT <3 seconds b/l LE. Palpable DP pulse(s) b/l LE. Palpable PT pulse(s) b/l LE. Pedal hair sparse. No pain with calf compression b/l. Lower extremity skin temperature gradient within normal limits. Trace edema noted BLE. No ischemia or gangrene noted b/l LE. No cyanosis or clubbing noted b/l LE.  Dermatological Examination: Pedal integument with normal turgor, texture and tone BLE. No open wounds b/l LE. No interdigital macerations noted b/l LE. Toenails 1-5 b/l elongated, discolored, dystrophic, thickened, crumbly with subungual debris and tenderness to dorsal palpation. Hyperkeratotic lesion(s)  {jgPodToeLocator:23637}.  No erythema, no edema, no drainage, no fluctuance.  Neurological Examination: Protective sensation diminished with 10g monofilament b/l. Vibratory sensation intact b/l.  Musculoskeletal Examination: Muscle strength 5/5 to all lower extremity muscle groups bilaterally. Uses white cane for sight impairment. HAV with bunion deformity noted b/l LE. Hammertoe(s) noted to the bilateral 2nd toes.  Footwear Assessment: Does the patient wear appropriate shoes? Yes. Does the patient need inserts/orthotics? No.  Lab Results  Component Value Date   HGBA1C 10.0 (A) 10/11/2022   No results found. ADA Risk Categorization: Low Risk :  Patient has all of the following: Intact protective sensation No prior foot ulcer  No severe deformity Pedal pulses present  High Risk  Patient has one or more of the following: Loss of protective sensation Absent pedal pulses Severe Foot deformity History of foot ulcer  Assessment: 1. Pain due to onychomycosis of toenail   2. Corns and callosities   3. Hallux  valgus, acquired, bilateral   4. Diabetic peripheral neuropathy associated with type 2 diabetes mellitus ([place])   5. Encounter for diabetic foot exam ([place])      Plan: No orders of the defined types were placed in this encounter.   No orders of the defined types were placed in this encounter.   None  -Patient was evaluated and treated. All patient's and/or POA's questions/concerns answered on today's visit. -Medicaid ABN on file for paring of corn(s)/callus(es)/porokeratos(es). Copy in patient chart. -Diabetic foot examination performed today. -Discussed and educated patient on diabetic foot care, especially with  regards to the vascular, neurological and musculoskeletal systems. -Continue supportive shoe gear daily. -Toenails 1-5 b/l were debrided in length and girth with sterile nail nippers and dremel without iatrogenic bleeding.  -Corn(s) {jgPodToeLocator:23637}  and callus(es) {jgPodToeLocator:23637} were pared utilizing sterile scalpel blade without incident. Total number debrided =***. -Patient/POA to call should there be question/concern in the interim. Return in about 3 months (around 04/17/2023).  Marzetta Board, DPM

## 2023-01-22 ENCOUNTER — Other Ambulatory Visit: Payer: Self-pay | Admitting: Internal Medicine

## 2023-01-22 DIAGNOSIS — I10 Essential (primary) hypertension: Secondary | ICD-10-CM

## 2023-01-23 NOTE — Telephone Encounter (Signed)
Next appt scheduled 3/27 with PCP. Last rx was written as "Print".

## 2023-02-16 ENCOUNTER — Encounter: Payer: Self-pay | Admitting: *Deleted

## 2023-02-16 DIAGNOSIS — Z139 Encounter for screening, unspecified: Secondary | ICD-10-CM

## 2023-02-16 LAB — GLUCOSE, POCT (MANUAL RESULT ENTRY): POC Glucose: 183 mg/dl — AB (ref 70–99)

## 2023-02-16 NOTE — Congregational Nurse Program (Signed)
  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 Stopped in 2010   Tobacco use disorder 10/07/2014    Encounter Details:  CNP Questionnaire - 02/16/23 0935       Questionnaire   Ask client: Do you give verbal consent for me to treat you today? Yes    Student Assistance [name]    Location Patient Served  [facility]    Visit Setting with Client Organization;Phone/Text/Email    Patient Status Unhoused    Insurance Medicaid    Insurance/Financial Assistance Referral [name]    Medication [name]    Medical Provider Yes    Screening Referrals Made [name]    Medical Referrals Made [name]    Medical Appointment Made [name]    Recently w/o PCP, now 1st time PCP visit completed due to CNs referral or appointment made [name]    Food [name]    Transportation [name]    Housing/Utilities No permanent housing    Interpersonal Safety [name]    Interventions [name]    Abnormal to Normal Screening Since Last CN Visit [name]    Screenings CN Performed Blood Pressure;Blood Glucose    Sent Client to Lab for: [name]    Did client attend any of the following based off CNs referral or appointments made? [name]    ED Visit Averted [name]    Life-Saving Intervention Made [name]            Client came to nurse's office for a blood pressure and blood sugar check. Blood pressure (!) 151/89, pulse 84. CBG 183. Client admits to not taking his medication as prescribed and often misses doses. Discussed how not taking medication consistently could be harmful and complications it could cause. Offered support and  encouragement. Client reports he will take his medication today.  [place] Dodgen W RN CN

## 2023-02-20 ENCOUNTER — Ambulatory Visit: Payer: Medicaid Other | Admitting: Podiatry

## 2023-02-21 ENCOUNTER — Other Ambulatory Visit: Payer: Self-pay

## 2023-02-21 ENCOUNTER — Ambulatory Visit (INDEPENDENT_AMBULATORY_CARE_PROVIDER_SITE_OTHER): Payer: Medicaid Other | Admitting: Internal Medicine

## 2023-02-21 ENCOUNTER — Encounter: Payer: Self-pay | Admitting: Internal Medicine

## 2023-02-21 VITALS — BP 158/111 | HR 62 | Temp 97.8°F | Ht 68.0 in | Wt 211.5 lb

## 2023-02-21 DIAGNOSIS — I10 Essential (primary) hypertension: Secondary | ICD-10-CM

## 2023-02-21 DIAGNOSIS — Z59819 Housing instability, housed unspecified: Secondary | ICD-10-CM | POA: Diagnosis not present

## 2023-02-21 DIAGNOSIS — Z7984 Long term (current) use of oral hypoglycemic drugs: Secondary | ICD-10-CM | POA: Diagnosis not present

## 2023-02-21 DIAGNOSIS — E785 Hyperlipidemia, unspecified: Secondary | ICD-10-CM | POA: Diagnosis not present

## 2023-02-21 DIAGNOSIS — E1136 Type 2 diabetes mellitus with diabetic cataract: Secondary | ICD-10-CM

## 2023-02-21 LAB — GLUCOSE, CAPILLARY: Glucose-Capillary: 123 mg/dL — ABNORMAL HIGH (ref 70–99)

## 2023-02-21 LAB — POCT GLYCOSYLATED HEMOGLOBIN (HGB A1C): Hemoglobin A1C: 6.4 % — AB (ref 4.0–5.6)

## 2023-02-21 MED ORDER — LISINOPRIL 40 MG PO TABS: 40.0000 mg | ORAL_TABLET | Freq: Every day | ORAL | 3 refills | Status: DC

## 2023-02-21 MED ORDER — EMPAGLIFLOZIN-METFORMIN HCL 5-500 MG PO TABS: 1.0000 | ORAL_TABLET | Freq: Two times a day (BID) | ORAL | 3 refills | Status: DC

## 2023-02-21 NOTE — Assessment & Plan Note (Signed)
Currently on un-homed, has difficulty affording and accessing his medications.  However he always comes to his follow-up appointments with me.  He is established with the [facility].

## 2023-02-21 NOTE — Assessment & Plan Note (Signed)
Reports compliance with Lipitor 80 mg daily.  Rechecking lipid panel today.

## 2023-02-21 NOTE — Patient Instructions (Signed)
Mr. Westmoreland,  It was a pleasure to see you today. Please follow up with me again in 3 months.   If you have any questions or concerns, call our clinic at [phone] or after hours call [phone] and ask for the internal medicine resident on call.   Thank you!  Dr. Darnell Level

## 2023-02-21 NOTE — Assessment & Plan Note (Signed)
Chronically uncontrolled, he has been out of his lisinopril now for a few days.  Says he was taking regularly before he ran out.  Refilled lisinopril 40 mg daily, sent 90-day supplies.  Rechecking renal function today.

## 2023-02-21 NOTE — Assessment & Plan Note (Signed)
Hemoglobin A1c much improved today, 6.4 from 10. We we restarted empagliflozin-metformin at his last visit, however he reports taking this only once a day.  Refill sent to his pharmacy, encouraged BID compliance.  Follow-up again in 3 months.

## 2023-02-21 NOTE — Progress Notes (Signed)
Subjective:   Patient ID: Brady Church male   DOB: Jun 10, 1961 62 y.o.   MRN: [id number]  HPI: Mr.Brady Church is a 62 y.o. male with past medical history outlined below here for follow up of HTN and DM. For further details of today's visit, please refer to the assessment and plan below.  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 Stopped in 2010   Tobacco use disorder 10/07/2014   Current Outpatient Medications  Medication Sig Dispense Refill   ammonium lactate (LAC-HYDRIN) 12 % lotion Apply 1 Application topically as needed for dry skin. 400 g 5   atorvastatin (LIPITOR) 80 MG tablet Take 1 tablet (80 mg total) by mouth daily at 6 PM. 30 tablet 11   brimonidine (ALPHAGAN) 0.15 % ophthalmic solution Place 1 drop into both eyes 2 (two) times daily. 5 mL 0   dorzolamide-timolol (COSOPT) 22.3-6.8 MG/ML ophthalmic solution Place 1 drop into the right eye 2 (two) times daily. 10 mL 0   Empagliflozin-metFORMIN HCl 5-500 MG TABS Take 1 tablet by mouth 2 (two) times daily. 180 tablet 3   fluorometholone (FML) 0.1 % ophthalmic suspension Place 1 drop into both eyes daily. 5 mL 0   gabapentin (NEURONTIN) 300 MG capsule Take 1 capsule (300 mg total) by mouth 3 (three) times daily. 90 capsule 5   ketoconazole (NIZORAL) 2 % cream Apply to both feet and between toes once daily for 6 weeks. 60 g 1   lisinopril (ZESTRIL) 40 MG tablet Take 1 tablet (40 mg total) by mouth daily. 90 tablet 3   ROCKLATAN 0.02-0.005 % SOLN Place 1 drop into the right eye at bedtime. 2.5 mL 0   sildenafil (REVATIO) 20 MG tablet Take 2-5 tablets (40-100 mg total) by mouth daily as needed (prior to  sexual activity). 30 tablet 2   No current facility-administered medications for this visit.   Family History  Problem Relation Age of Onset   Coronary artery disease Mother 54       died of MI   Cancer Father 40       Some GI cancer. Not sure if it was Colon Cancer or not.   Hypertension Father    Diabetes Maternal Grandmother    Social History   Socioeconomic History   Marital status: Single    Spouse name: Not on file   Number of children: Not on file   Years of education: 12   Highest education level: Not on file  Occupational History   Not on file  Tobacco Use   Smoking status: Former    Packs/day: .5    Types: Cigarettes    Quit date: 02/18/2017    Years since quitting: 6.0   Smokeless tobacco: Never  Substance and Sexual Activity   Alcohol use: Yes    Comment: Beer- 40 oz. every day.   Drug use: Not Currently    Types: Cocaine, Marijuana    Comment: Sometimes.   Sexual activity: Not on file  Other Topics Concern   Not on file  Social History Narrative   Lives in [place] for last 10 years with "  friend"  [name]      Is not married and does not have kids.   Working sporadically as a Horticulturist, commercial.    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   Social Determinants of Health   Financial Resource Strain: High Risk (10/11/2022)   Overall Financial Resource Strain (CARDIA)    Difficulty of Paying Living Expenses: Very hard  Food Insecurity: Food Insecurity Present (10/11/2022)   Hunger Vital Sign    Worried About Running Out of Food in the Last Year: Often true    Ran Out of Food in the Last Year: Often true  Transportation Needs: Unmet Transportation Needs (10/11/2022)   PRAPARE - Hydrologist (Medical): Yes    Lack of Transportation (Non-Medical): Yes  Physical Activity: Not on file  Stress: Not on file  Social Connections: Socially Isolated (10/11/2022)   Social Connection and Isolation Panel  [NHANES]    Frequency of Communication with Friends and Family: Never    Frequency of Social Gatherings with Friends and Family: Never    Attends Religious Services: Never    Marine scientist or Organizations: No    Attends Music therapist: Never    Marital Status: Never married     Objective:  Physical Exam:  Vitals:   02/21/23 1046 02/21/23 1121  BP: (!) 165/106 (!) 158/111  Pulse: 67 62  Temp: 97.8 F (36.6 C)   TempSrc: Oral   SpO2: 100%   Weight: 211 lb 8 oz (95.9 kg)   Height: 5\' 8"  (1.727 m)     Constitutional: NAD Cardiovascular: RRR, no m/r/g Pulmonary/Chest: Clear bilaterally, normal effort Psychiatric: NOomal mood and affect  Assessment & Plan:   Diabetes mellitus with diabetic cataract (HCC) Hemoglobin A1c much improved today, 6.4 from 10. We we restarted empagliflozin-metformin at his last visit, however he reports taking this only once a day.  Refill sent to his pharmacy, encouraged BID compliance.  Follow-up again in 3 months.  Hypertension Chronically uncontrolled, he has been out of his lisinopril now for a few days.  Says he was taking regularly before he ran out.  Refilled lisinopril 40 mg daily, sent 90-day supplies.  Rechecking renal function today.  Housing instability Currently on un-homed, has difficulty affording and accessing his medications.  However he always comes to his follow-up appointments with me.  He is established with the [facility]   Hyperlipidemia LDL goal <70 Reports compliance with Lipitor 80 mg daily.  Rechecking lipid panel today.

## 2023-02-22 LAB — LIPID PANEL
Chol/HDL Ratio: 2.6 ratio (ref 0.0–5.0)
Cholesterol, Total: 201 mg/dL — ABNORMAL HIGH (ref 100–199)
HDL: 76 mg/dL (ref >39)
LDL Chol Calc (NIH): 111 mg/dL — ABNORMAL HIGH (ref 0–99)
Triglycerides: 81 mg/dL (ref 0–149)
VLDL Cholesterol Cal: 14 mg/dL (ref 5–40)

## 2023-02-22 LAB — BMP8+ANION GAP
Anion Gap: 16 mmol/L (ref 10.0–18.0)
BUN/Creatinine Ratio: 16 (ref 10–24)
BUN: 15 mg/dL (ref 8–27)
CO2: 24 mmol/L (ref 20–29)
Calcium: 9.2 mg/dL (ref 8.6–10.2)
Chloride: 99 mmol/L (ref 96–106)
Creatinine, Ser: 0.95 mg/dL (ref 0.76–1.27)
Glucose: 104 mg/dL — ABNORMAL HIGH (ref 70–99)
Potassium: 4.1 mmol/L (ref 3.5–5.2)
Sodium: 139 mmol/L (ref 134–144)
eGFR: 92 mL/min/{1.73_m2} (ref >59)

## 2023-03-21 LAB — AMB RESULTS CONSOLE CBG: Glucose: 234

## 2023-03-21 NOTE — Progress Notes (Signed)
Pt has PCP. Pt has assitance for SDOH needs from [facility]. Pt has PCP with [facility] internal medicine Roney Marion, MD.

## 2023-04-13 ENCOUNTER — Encounter: Payer: Self-pay | Admitting: *Deleted

## 2023-04-13 NOTE — Progress Notes (Signed)
The patient attended 03/21/23 screening event where His BP screening results were 141/94. At the event the patient noted he did not need SDOH assistance he is getting help from the [facility]. He also stated he has a PCP. Per chart review PCP is aware of the Pts hypertension and diabetes status. The last PCP office visit was 02/21/23.  Per chart review the Pt had a 90 day supply Lisinopril. Chart review also indicates a future appt 6/24. In-basket message with event results sent to PCP. No addidtional Health equity team support indicated at this time.   Johnnye Sima Care Guide,

## 2023-04-16 ENCOUNTER — Ambulatory Visit (INDEPENDENT_AMBULATORY_CARE_PROVIDER_SITE_OTHER): Payer: Medicaid Other | Admitting: Podiatry

## 2023-04-16 DIAGNOSIS — M79676 Pain in unspecified toe(s): Secondary | ICD-10-CM | POA: Diagnosis not present

## 2023-04-16 DIAGNOSIS — B351 Tinea unguium: Secondary | ICD-10-CM

## 2023-04-16 NOTE — Progress Notes (Signed)
       Subjective:  Patient ID: Brady Church, male    DOB: [birth year],  MRN: [id number]  Brady Church presents to clinic today for:  Chief Complaint  Patient presents with   Nail Problem    RM 10 RFC bilateral nail trim 1-5.   Marland Kitchen Patient notes nails are thick and elongated, causing pain in shoe gear when ambulating.  He also has painful calluses present on the first and second toes.  PCP is [name], MD.  No Known Allergies  Review of Systems: Negative except as noted in the HPI.  Objective:  There were no vitals filed for this visit.  Brady Church is a pleasant 62 y.o. male in NAD. AAO x 3.  Vascular Examination: Patient has palpable DP pulse, absent PT pulse bilateral.  Delayed capillary refill bilateral toes.  Sparse digital hair bilateral.  Proximal to distal cooling WNL bilateral.    Dermatological Examination: Interspaces are clear with no open lesions noted bilateral.  Nails are 3-75mm thick, with yellowish/brown discoloration, subungual debris and distal onycholysis x10.  There is pain with compression of nails x10.  There are hyperkeratotic lesions noted on the plantar medial aspect of the hallux IPJ, distal aspect of second toe bilateral.  Neurological Examination: Protective sensation intact b/l LE. Vibratory sensation absent b/l LE.  Musculoskeletal Examination: Muscle strength 5/5 to all LE muscle groups b/l.      Latest Ref Rng & Units 02/21/2023   11:01 AM 10/11/2022    9:34 AM 07/05/2022   10:04 AM  Hemoglobin A1C  Hemoglobin-A1c 4.0 - 5.6 % 6.4  10.0  8.0    Patient qualifies for at-risk foot care because of diabetes with PVD.  Assessment/Plan: 1. Dermatophytosis of nail      Mycotic nails x10 were sharply debrided with sterile nail nippers and power debriding burr to decrease bulk and length.  Hyperkeratotic lesions were shaved with #312 blade.   Return in about 3 months (around 07/17/2023) for [facility].   Clerance Lav, DPM,  FACFAS [facility]     [address] [place], [place] [address]                Office [phone]  Fax [phone]

## 2023-05-02 ENCOUNTER — Ambulatory Visit: Payer: Medicaid Other | Admitting: Podiatry

## 2023-05-08 ENCOUNTER — Telehealth: Payer: Self-pay

## 2023-05-08 NOTE — Telephone Encounter (Signed)
Patient attempted to be outreached by Christella Noa, PharmD Candidate on 05/08/23 to discuss hypertension. Voicemail box was full.  Christella Noa, Student-PharmD

## 2023-05-09 ENCOUNTER — Encounter: Payer: Medicaid Other | Admitting: Internal Medicine

## 2023-05-23 ENCOUNTER — Encounter: Payer: Medicaid Other | Admitting: Internal Medicine

## 2023-06-08 ENCOUNTER — Ambulatory Visit: Payer: MEDICAID | Admitting: Podiatry

## 2023-06-15 ENCOUNTER — Other Ambulatory Visit: Payer: Self-pay

## 2023-06-15 DIAGNOSIS — I10 Essential (primary) hypertension: Secondary | ICD-10-CM

## 2023-06-15 MED ORDER — LISINOPRIL 40 MG PO TABS: 40.0000 mg | ORAL_TABLET | Freq: Every day | ORAL | 3 refills | Status: DC

## 2023-06-22 LAB — HM DIABETES EYE EXAM

## 2023-06-27 ENCOUNTER — Encounter: Payer: Medicaid Other | Admitting: Internal Medicine

## 2023-06-27 ENCOUNTER — Ambulatory Visit: Payer: MEDICAID | Admitting: Podiatry

## 2023-06-29 ENCOUNTER — Ambulatory Visit: Payer: MEDICAID | Admitting: Podiatry

## 2023-07-18 DIAGNOSIS — Z419 Encounter for procedure for purposes other than remedying health state, unspecified: Secondary | ICD-10-CM

## 2023-07-18 DIAGNOSIS — Z139 Encounter for screening, unspecified: Secondary | ICD-10-CM

## 2023-07-18 NOTE — Congregational Nurse Program (Signed)
  Dept: [phone]   Congregational Nurse Program Note  Date of Encounter: 07/18/2023 Client to [facility] day center, new participant. He reports he is staying with his brother in [place]. He requested his blood pressure and blood glucose be checked. Blood glucose non fasting 311. Client reports he tries to keep his blood sugar under control with diet and reports he has been "eating a lot of sweets". He also reports he takes lisinopril 40mg  daily and does have his medication. BP (!) 154/88 (BP Location: Left Arm, Patient Position: Sitting, Cuff Size: Large) . Diet education provided. 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 Stopped in 2010   Tobacco use disorder 10/07/2014    Encounter Details:  CNP Questionnaire - 07/18/23 1035       Questionnaire   Ask client: Do you give verbal consent for me to treat you today? Yes    Student Assistance [name]    Location Patient Served  [facility]    Visit Setting with Client Organization    Patient Status Unhoused   reports he is staying with his brother   Insurance Medicaid    Insurance/Financial Assistance Referral [name]    Medication [name]    Medical Provider Yes   states he is seen at the [facility] in [facility]   Screening Referrals Made [name]    Medical Referrals Made [name]    Medical Appointment Made [name]    Recently w/o PCP, now 1st time PCP visit completed due to CNs referral or appointment made [name]    Food [name]    Transportation [name]    Housing/Utilities No permanent housing    Interpersonal Safety [name]    Interventions Advocate/Support    Abnormal to  Normal Screening Since Last CN Visit [name]    Screenings CN Performed Blood Pressure;Blood Glucose    Sent Client to Lab for: [name]    Did client attend any of the following based off CNs referral or appointments made? [name]    ED Visit Averted [name]    Life-Saving Intervention Made [name]

## 2023-07-20 ENCOUNTER — Telehealth: Payer: Self-pay

## 2023-07-20 NOTE — Progress Notes (Signed)
Patient attempted to be outreached by Mack Guise on 07/20/23 to discuss hypertension. Left voicemail for patient to return our call at their convenience at [phone].  Mack Guise, Student-PharmD

## 2023-07-23 ENCOUNTER — Ambulatory Visit (INDEPENDENT_AMBULATORY_CARE_PROVIDER_SITE_OTHER): Payer: MEDICAID | Admitting: Podiatry

## 2023-07-23 DIAGNOSIS — B351 Tinea unguium: Secondary | ICD-10-CM

## 2023-07-23 NOTE — Progress Notes (Signed)
       Subjective:  Patient ID: Brady Church, male    DOB: 17-Dec-1960,  MRN: [id number]   Brady Church presents to clinic today for:  Chief Complaint  Patient presents with   Diabetes    [facility]    Callouses   Patient notes nails are thick, discolored, elongated and painful in shoegear when trying to ambulate.  He also gets some callus buildup on the plantar medial aspect of the hallux IPJ and MPJ bilateral  PCP is Brady Poll, MD.  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 Stopped in 2010   Tobacco use disorder 10/07/2014    No Known Allergies  Review of Systems: Negative except as noted in the HPI.  Objective:  There were no vitals filed for this visit.  Brady Church is a pleasant 62 y.o. male in NAD. AAO x 3.  Vascular Examination: Capillary refill time is 3-5 seconds to toes bilateral. Palpable pedal pulses b/l LE. Digital hair present b/l.  Skin temperature gradient WNL b/l. No varicosities b/l. No cyanosis noted b/l.   Dermatological Examination: Pedal skin with normal turgor, texture and tone b/l. No open wounds. No interdigital macerations b/l. Toenails x10 are 3mm thick, discolored, dystrophic with subungual debris. There is pain with compression of the nail plates.  They are elongated x10.  Mild hyperkeratotic lesions on the plantar medial aspect the hallux IPJ and the first metatarsophalangeal joint bilateral      Latest Ref Rng & Units 02/21/2023   11:01 AM 10/11/2022    9:34 AM  Hemoglobin A1C  Hemoglobin-A1c 4.0 - 5.6 % 6.4  10.0     Assessment/Plan: 1. Dermatophytosis of nail     The mycotic toenails were sharply  debrided x10 with sterile nail nippers and a power debriding burr to decrease bulk/thickness and length.  The calluses were sanded with a Dremel bur uneventfully today.  Return in about 3 months (around 10/23/2023) for [facility].   Brady Church, DPM, FACFAS [facility]     [address] [place], [place] [address]                Office [phone]  Fax [phone]

## 2023-07-25 ENCOUNTER — Encounter: Payer: Medicaid Other | Admitting: Internal Medicine

## 2023-07-25 ENCOUNTER — Other Ambulatory Visit: Payer: Self-pay | Admitting: *Deleted

## 2023-07-25 DIAGNOSIS — G6289 Other specified polyneuropathies: Secondary | ICD-10-CM

## 2023-07-25 DIAGNOSIS — N529 Male erectile dysfunction, unspecified: Secondary | ICD-10-CM

## 2023-07-25 MED ORDER — SILDENAFIL CITRATE 20 MG PO TABS: 40.0000 mg | ORAL_TABLET | Freq: Every day | ORAL | 2 refills | Status: DC | PRN

## 2023-07-25 MED ORDER — GABAPENTIN 300 MG PO CAPS: 300.0000 mg | ORAL_CAPSULE | Freq: Three times a day (TID) | ORAL | 5 refills | Status: DC

## 2023-07-25 NOTE — Telephone Encounter (Signed)
Next appt scheduled  9/4 with PCP.

## 2023-07-27 ENCOUNTER — Emergency Department (HOSPITAL_COMMUNITY)
Admission: EM | Admit: 2023-07-27 | Discharge: 2023-07-27 | Disposition: A | Discharge status: Home / Self Care | Payer: MEDICAID | Attending: Emergency Medicine | Admitting: Emergency Medicine

## 2023-07-27 ENCOUNTER — Emergency Department (HOSPITAL_COMMUNITY): Payer: MEDICAID

## 2023-07-27 ENCOUNTER — Other Ambulatory Visit: Payer: Self-pay

## 2023-07-27 DIAGNOSIS — N23 Unspecified renal colic: Secondary | ICD-10-CM | POA: Insufficient documentation

## 2023-07-27 DIAGNOSIS — R1031 Right lower quadrant pain: Secondary | ICD-10-CM | POA: Diagnosis present

## 2023-07-27 DIAGNOSIS — Z79899 Other long term (current) drug therapy: Secondary | ICD-10-CM | POA: Diagnosis not present

## 2023-07-27 LAB — URINALYSIS, W/ REFLEX TO CULTURE (INFECTION SUSPECTED)
Bacteria, UA: NONE SEEN
Bilirubin Urine: NEGATIVE
Glucose, UA: NEGATIVE mg/dL
Ketones, ur: 5 mg/dL — AB
Leukocytes,Ua: NEGATIVE
Nitrite: NEGATIVE
Protein, ur: 100 mg/dL — AB
RBC / HPF: >50 RBC/hpf (ref 0–5)
Specific Gravity, Urine: 1.016 (ref 1.005–1.030)
pH: 6 (ref 5.0–8.0)

## 2023-07-27 LAB — ETHANOL: Alcohol, Ethyl (B): <10 mg/dL (ref <10)

## 2023-07-27 LAB — COMPREHENSIVE METABOLIC PANEL
ALT: 22 U/L (ref 0–44)
AST: 24 U/L (ref 15–41)
Albumin: 3.8 g/dL (ref 3.5–5.0)
Alkaline Phosphatase: 53 U/L (ref 38–126)
Anion gap: 14 (ref 5–15)
BUN: 11 mg/dL (ref 6–20)
CO2: 21 mmol/L — ABNORMAL LOW (ref 22–32)
Calcium: 8.5 mg/dL — ABNORMAL LOW (ref 8.9–10.3)
Chloride: 104 mmol/L (ref 98–111)
Creatinine, Ser: 0.97 mg/dL (ref 0.61–1.24)
GFR, Estimated: >60 mL/min (ref >60)
Glucose, Bld: 141 mg/dL — ABNORMAL HIGH (ref 70–99)
Potassium: 3.2 mmol/L — ABNORMAL LOW (ref 3.5–5.1)
Sodium: 139 mmol/L (ref 135–145)
Total Bilirubin: 0.9 mg/dL (ref 0.3–1.2)
Total Protein: 7.3 g/dL (ref 6.5–8.1)

## 2023-07-27 LAB — CBC WITH DIFFERENTIAL/PLATELET
Abs Immature Granulocytes: 0.03 10*3/uL (ref 0.00–0.07)
Basophils Absolute: 0 10*3/uL (ref 0.0–0.1)
Basophils Relative: 1 %
Eosinophils Absolute: 0 10*3/uL (ref 0.0–0.5)
Eosinophils Relative: 1 %
HCT: 39.8 % (ref 39.0–52.0)
Hemoglobin: 13.7 g/dL (ref 13.0–17.0)
Immature Granulocytes: 1 %
Lymphocytes Relative: 16 %
Lymphs Abs: 1.1 10*3/uL (ref 0.7–4.0)
MCH: 32 pg (ref 26.0–34.0)
MCHC: 34.4 g/dL (ref 30.0–36.0)
MCV: 93 fL (ref 80.0–100.0)
Monocytes Absolute: 0.4 10*3/uL (ref 0.1–1.0)
Monocytes Relative: 6 %
Neutro Abs: 5.1 10*3/uL (ref 1.7–7.7)
Neutrophils Relative %: 75 %
Platelets: 308 10*3/uL (ref 150–400)
RBC: 4.28 MIL/uL (ref 4.22–5.81)
RDW: 14.4 % (ref 11.5–15.5)
WBC: 6.7 10*3/uL (ref 4.0–10.5)
nRBC: 0 % (ref 0.0–0.2)

## 2023-07-27 LAB — RAPID URINE DRUG SCREEN, HOSP PERFORMED
Amphetamines: NOT DETECTED
Barbiturates: NOT DETECTED
Benzodiazepines: NOT DETECTED
Cocaine: POSITIVE — AB
Opiates: POSITIVE — AB
Tetrahydrocannabinol: NOT DETECTED

## 2023-07-27 LAB — LIPASE, BLOOD: Lipase: 21 U/L (ref 11–51)

## 2023-07-27 MED ORDER — MORPHINE SULFATE (PF) 4 MG/ML IV SOLN
4.0000 mg | Freq: Once | INTRAVENOUS | Status: AC
  Administered 2023-07-27: 4 mg via INTRAVENOUS
  Filled 2023-07-27: qty 1

## 2023-07-27 MED ORDER — HYDROCODONE-ACETAMINOPHEN 5-325 MG PO TABS: 1.0000 | ORAL_TABLET | Freq: Four times a day (QID) | ORAL | 0 refills | Status: DC | PRN

## 2023-07-27 MED ORDER — SODIUM CHLORIDE 0.9 % IV BOLUS
500.0000 mL | Freq: Once | INTRAVENOUS | Status: AC
  Administered 2023-07-27: 500 mL via INTRAVENOUS

## 2023-07-27 MED ORDER — ONDANSETRON HCL 4 MG PO TABS: 4.0000 mg | ORAL_TABLET | Freq: Four times a day (QID) | ORAL | 0 refills | Status: DC

## 2023-07-27 MED ORDER — IOHEXOL 350 MG/ML SOLN
75.0000 mL | Freq: Once | INTRAVENOUS | Status: AC | PRN
  Administered 2023-07-27: 75 mL via INTRAVENOUS

## 2023-07-27 MED ORDER — KETOROLAC TROMETHAMINE 15 MG/ML IJ SOLN
15.0000 mg | Freq: Once | INTRAMUSCULAR | Status: AC
  Administered 2023-07-27: 15 mg via INTRAVENOUS
  Filled 2023-07-27: qty 1

## 2023-07-27 MED ORDER — TAMSULOSIN HCL 0.4 MG PO CAPS: 0.4000 mg | ORAL_CAPSULE | Freq: Every day | ORAL | 0 refills | Status: AC

## 2023-07-27 MED ORDER — ONDANSETRON HCL 4 MG/2ML IJ SOLN
4.0000 mg | Freq: Once | INTRAMUSCULAR | Status: AC
  Administered 2023-07-27: 4 mg via INTRAVENOUS
  Filled 2023-07-27: qty 2

## 2023-07-27 NOTE — Discharge Instructions (Signed)
Return for any problem.  ?

## 2023-07-27 NOTE — ED Provider Notes (Signed)
[place] EMERGENCY DEPARTMENT AT [facility] Provider Note   CSN: [id number] Arrival date & time: 07/27/23  4098     History  Chief Complaint  Patient presents with   Abdominal Pain    Brady Church is a 62 y.o. male.  62 year old male with prior medical history as detailed below presents for evaluation.  Patient arrives with EMS transport from [facility].  Patient reports acute onset right lower quadrant abdominal pain.  Pain began this morning.  He reports nausea but no vomiting.  EMS administered fentanyl during transport with some improvement in his pain.  Patient denies back pain.  He denies difficulty with bowel movements or urination.  He denies vomiting.  He denies fever.  Patient reports that he does drink alcohol on a daily basis.  He reports drinking about 40 ounces of malt liquor daily.  His last alcohol intake was yesterday.  He denies recent cocaine or marijuana use.  He reports a distant history of appendectomy.  The history is provided by the patient and medical records.       Home Medications Prior to Admission medications   Medication Sig Start Date End Date Taking? Authorizing Provider  ammonium lactate (LAC-HYDRIN) 12 % lotion Apply 1 Application topically as needed for dry skin. 10/13/22   [name], DPM  atorvastatin (LIPITOR) 80 MG tablet Take 1 tablet (80 mg total) by mouth daily at 6 PM. 10/11/22   [name], MD  brimonidine (ALPHAGAN) 0.15 % ophthalmic solution Place 1 drop into both eyes 2 (two) times daily. 08/30/22   Lauree Chandler, NP  dorzolamide-timolol (COSOPT) 22.3-6.8 MG/ML ophthalmic solution Place 1 drop into the right eye 2 (two) times daily. 08/30/22   Lauree Chandler, NP  Empagliflozin-metFORMIN HCl 5-500 MG TABS Take 1 tablet by mouth 2 (two) times daily. 02/21/23   [name], MD  fluorometholone (FML) 0.1 % ophthalmic suspension Place 1 drop into both eyes daily. 08/31/22   Lauree Chandler, NP  gabapentin (NEURONTIN) 300 MG capsule Take 1 capsule (300 mg total) by mouth 3 (three) times daily. 07/25/23   [name], MD  ketoconazole (NIZORAL) 2 % cream Apply to both feet and between toes once daily for 6 weeks. 10/13/22   [name], DPM  lisinopril (ZESTRIL) 40 MG tablet Take 1 tablet (40 mg total) by mouth daily. 06/15/23   [name], MD  ROCKLATAN 0.02-0.005 % SOLN Place 1 drop into the right eye at bedtime. 08/30/22   Lauree Chandler, NP  sildenafil (REVATIO) 20 MG tablet Take 2-5 tablets (40-100 mg total) by mouth daily as needed (prior to sexual activity). 07/25/23   [name], MD      Allergies    Patient has no known allergies.    Review of Systems   Review of Systems  All other systems reviewed and are negative.   Physical Exam Updated Vital Signs There were no vitals taken for this visit. Physical Exam Vitals and nursing note reviewed.  Constitutional:      General: He is not in acute distress.    Appearance: Normal appearance. He is well-developed.  HENT:     Head: Normocephalic and atraumatic.  Eyes:     Conjunctiva/sclera: Conjunctivae normal.     Pupils: Pupils are equal, round, and reactive to light.  Cardiovascular:     Rate and Rhythm: Normal rate and regular rhythm.     Heart sounds: Normal heart sounds.  Pulmonary:  Effort: Pulmonary effort is normal. No respiratory distress.     Breath sounds: Normal breath sounds.  Abdominal:     General: There is no distension.     Palpations: Abdomen is soft.     Tenderness: There is abdominal tenderness in the right lower quadrant.  Musculoskeletal:        General: No deformity. Normal range of motion.     Cervical back: Normal range of motion and neck supple.  Skin:    General: Skin is warm and dry.  Neurological:     General: No focal deficit present.     Mental Status: He is alert and oriented to person, place, and time.     ED Results / Procedures /  Treatments   Labs (all labs ordered are listed, but only abnormal results are displayed) Labs Reviewed  CBC WITH DIFFERENTIAL/PLATELET  LIPASE, BLOOD  ETHANOL  COMPREHENSIVE METABOLIC PANEL  URINALYSIS, W/ REFLEX TO CULTURE (INFECTION SUSPECTED)  RAPID URINE DRUG SCREEN, HOSP PERFORMED    EKG None  Radiology No results found.  Procedures Procedures    Medications Ordered in ED Medications  sodium chloride 0.9 % bolus 500 mL (has no administration in time range)  morphine (PF) 4 MG/ML injection 4 mg (has no administration in time range)  ondansetron (ZOFRAN) injection 4 mg (has no administration in time range)    ED Course/ Medical Decision Making/ A&P                                 Medical Decision Making Amount and/or Complexity of Data Reviewed Labs: ordered. Radiology: ordered.  Risk Prescription drug management.    Medical Screen Complete  This patient presented to the ED with complaint of RLQ  pain.  This complaint involves an extensive number of treatment options. The initial differential diagnosis includes, but is not limited to, renal colic, intra-abdominal pathology, metabolic abnormality, etc.  This presentation is: Acute, Self-Limited, Previously Undiagnosed, Uncertain Prognosis, Complicated, Systemic Symptoms, and Threat to Life/Bodily Function  Patient presents with complaint of right-sided flank pain.  Exam is consistent with likely renal colic.  Workup supports this with evidence of right-sided ureteral stone.    Patient given IV fluids, pain medicines, antiemetics.  Patient feels improved.  At discharge he is ambulatory and is without complaint.  He reports he is feeling significant proved.  Patient encouraged to follow-up closely with urology.  Importance of close follow-up is stressed.  Additional history obtained: External records from outside sources obtained and reviewed including prior ED visits and prior Inpatient records.     Problem List / ED Course:  Renal colic   Reevaluation:  After the interventions noted above, I reevaluated the patient and found that they have: improved  Disposition:  After consideration of the diagnostic results and the patients response to treatment, I feel that the patent would benefit from close outpatient followup.          Final Clinical Impression(s) / ED Diagnoses Final diagnoses:  Renal colic    Rx / DC Orders ED Discharge Orders          Ordered    tamsulosin (FLOMAX) 0.4 MG CAPS capsule  Daily        07/27/23 1348    HYDROcodone-acetaminophen (NORCO/VICODIN) 5-325 MG tablet  Every 6 hours PRN        07/27/23 1348    ondansetron (ZOFRAN) 4 MG tablet  Every 6  hours        07/27/23 1348              Wynetta Fines, MD 07/27/23 [phone]

## 2023-07-27 NOTE — ED Triage Notes (Signed)
Patient arrives by EMS from [facility] with c/o RLQ pain that began at 0500 this morning.  Patient reports nausea.   Patient has history HTN, DM.   Patient received 100 mcg Fentanyl by EMS rates pain 8/10.

## 2023-08-01 ENCOUNTER — Encounter: Payer: Medicaid Other | Admitting: Internal Medicine

## 2023-08-08 ENCOUNTER — Encounter: Payer: Self-pay | Admitting: Internal Medicine

## 2023-08-14 ENCOUNTER — Other Ambulatory Visit: Payer: Self-pay | Admitting: Internal Medicine

## 2023-08-14 DIAGNOSIS — E1136 Type 2 diabetes mellitus with diabetic cataract: Secondary | ICD-10-CM

## 2023-08-15 MED ORDER — EMPAGLIFLOZIN-METFORMIN HCL 5-500 MG PO TABS
1.0000 | ORAL_TABLET | Freq: Two times a day (BID) | ORAL | 3 refills | Status: DC
Start: 2023-08-15 — End: 2023-10-02

## 2023-08-15 NOTE — Telephone Encounter (Signed)
Received refill request for metformin - looks like he should be taking synjardy instead. Declined metformin and refilled synjardy instead.

## 2023-08-16 ENCOUNTER — Telehealth: Payer: Self-pay | Admitting: Internal Medicine

## 2023-08-16 ENCOUNTER — Telehealth: Payer: Self-pay

## 2023-08-16 DIAGNOSIS — G6289 Other specified polyneuropathies: Secondary | ICD-10-CM

## 2023-08-16 DIAGNOSIS — I10 Essential (primary) hypertension: Secondary | ICD-10-CM

## 2023-08-16 NOTE — Telephone Encounter (Signed)
Decision:Approved Brady Church (Key: [facility]) PA Case ID #: [id number] Rx #: [id number] Need Help? Call us at [phone] Outcome Approved today by [employer] 2017 Approved. SYNJARDY 5/500/MG Tablet is approved from 08/16/2023 to 08/15/2024. All strengths of the drug are approved. Authorization Expiration Date: 08/15/2024 Drug Synjardy 5-500MG  tablets ePA cloud logo Form [employer] Electronic Prior Authorization Form Original Claim Info 75

## 2023-08-16 NOTE — Telephone Encounter (Signed)
atorvastatin (LIPITOR) 80 MG tablet   lisinopril (ZESTRIL) 40 MG tablet   gabapentin (NEURONTIN) 300 MG capsule     [id number] - [place], [place] - [address]

## 2023-08-16 NOTE — Telephone Encounter (Signed)
Thank you :)

## 2023-08-16 NOTE — Telephone Encounter (Signed)
Call to pharmacy patient has refills at the Pharmacy.  His Lisinopril will not be available until 08/21/2023.  Will call patient to inform him that his meds are at the Pharmacy.  Attempted to call patient x2 voice mail box not set up.

## 2023-08-16 NOTE — Telephone Encounter (Signed)
Prior Authorization for patient Brady Church) came through on cover my meds was submitted with last office notes and labs awaiting approval or denial.  ZOX:[id number]

## 2023-08-22 ENCOUNTER — Other Ambulatory Visit: Payer: Self-pay

## 2023-08-22 NOTE — Progress Notes (Signed)
Brady Church 1961/05/31 [id number]  Patient attempted to be outreached by Thomasene Ripple, PharmD Candidate to discuss hypertension.   Thomasene Ripple, Student-PharmD

## 2023-08-29 ENCOUNTER — Other Ambulatory Visit: Payer: Self-pay

## 2023-08-29 ENCOUNTER — Ambulatory Visit: Payer: MEDICAID | Admitting: Internal Medicine

## 2023-08-29 VITALS — BP 115/64 | HR 80 | Temp 98.0°F | Ht 68.0 in | Wt 215.5 lb

## 2023-08-29 DIAGNOSIS — Z8673 Personal history of transient ischemic attack (TIA), and cerebral infarction without residual deficits: Secondary | ICD-10-CM | POA: Diagnosis not present

## 2023-08-29 DIAGNOSIS — E1136 Type 2 diabetes mellitus with diabetic cataract: Secondary | ICD-10-CM

## 2023-08-29 DIAGNOSIS — N200 Calculus of kidney: Secondary | ICD-10-CM

## 2023-08-29 DIAGNOSIS — Z59819 Housing instability, housed unspecified: Secondary | ICD-10-CM

## 2023-08-29 DIAGNOSIS — Z87891 Personal history of nicotine dependence: Secondary | ICD-10-CM

## 2023-08-29 DIAGNOSIS — Z7984 Long term (current) use of oral hypoglycemic drugs: Secondary | ICD-10-CM

## 2023-08-29 DIAGNOSIS — I10 Essential (primary) hypertension: Secondary | ICD-10-CM

## 2023-08-29 DIAGNOSIS — I63512 Cerebral infarction due to unspecified occlusion or stenosis of left middle cerebral artery: Secondary | ICD-10-CM

## 2023-08-29 LAB — POCT GLYCOSYLATED HEMOGLOBIN (HGB A1C): Hemoglobin A1C: 7.8 % — AB (ref 4.0–5.6)

## 2023-08-29 LAB — GLUCOSE, CAPILLARY: Glucose-Capillary: 231 mg/dL — ABNORMAL HIGH (ref 70–99)

## 2023-08-29 MED ORDER — ASPIRIN 81 MG PO TBEC: 81.0000 mg | DELAYED_RELEASE_TABLET | Freq: Every day | ORAL | 2 refills | Status: DC

## 2023-08-29 NOTE — Patient Instructions (Addendum)
Brady Church,  It was a pleasure to see you. Please take the following medicatons:  -- Empagliflozin-Metformin twice a day for your diabetes -- Atorvastatin for your cholesterol  -- Aspirin 81 mg daily for your prior stroke   Please follow up with me again in 3 months.   If you have any questions or concerns, call our clinic at [phone] or after hours call [phone] and ask for the internal medicine resident on call.   Thank you!  Dr. Reece Agar

## 2023-08-29 NOTE — Progress Notes (Unsigned)
Subjective:   Patient ID: Brady Church male   DOB: 1960-12-24 62 y.o.   MRN: [id number]  HPI: Mr.Brady Church is a 62 y.o. male with past medical history outlined below here for follow up of his medical conditions. For further details of today's visit, please refer to the assessment and plan below.   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 Stopped in 2010   Tobacco use disorder 10/07/2014   Current Outpatient Medications  Medication Sig Dispense Refill   aspirin EC 81 MG tablet Take 1 tablet (81 mg total) by mouth daily. Swallow whole. 150 tablet 2   ammonium lactate (LAC-HYDRIN) 12 % lotion Apply 1 Application topically as needed for dry skin. 400 g 5   atorvastatin (LIPITOR) 80 MG tablet Take 1 tablet (80 mg total) by mouth daily at 6 PM. 30 tablet 11   brimonidine (ALPHAGAN) 0.15 % ophthalmic solution Place 1 drop into both eyes 2 (two) times daily. 5 mL 0   dorzolamide-timolol (COSOPT) 22.3-6.8 MG/ML ophthalmic solution Place 1 drop into the right eye 2 (two) times daily. 10 mL 0   Empagliflozin-metFORMIN HCl 5-500 MG TABS Take 1 tablet by mouth 2 (two) times daily. 180 tablet 3   fluorometholone (FML) 0.1 % ophthalmic suspension Place 1 drop into both eyes daily. 5 mL 0   ketoconazole (NIZORAL) 2 % cream Apply to both feet and between toes once daily for 6 weeks. 60 g 1   ROCKLATAN 0.02-0.005 % SOLN Place 1 drop into the right eye at bedtime. 2.5 mL 0   sildenafil (REVATIO) 20 MG tablet Take 2-5 tablets (40-100 mg total) by mouth daily as needed (prior to sexual activity). 30 tablet 2   No current facility-administered medications for this visit.    Family History  Problem Relation Age of Onset   Coronary artery disease Mother 15       died of MI   Cancer Father 1       Some GI cancer. Not sure if it was Colon Cancer or not.   Hypertension Father    Diabetes Maternal Grandmother    Social History   Socioeconomic History   Marital status: Single    Spouse name: Not on file   Number of children: Not on file   Years of education: 12   Highest education level: Not on file  Occupational History   Not on file  Tobacco Use   Smoking status: Former    Current packs/day: 0.00    Types: Cigarettes    Quit date: 02/18/2017    Years since quitting: 6.5   Smokeless tobacco: Never  Substance and Sexual Activity   Alcohol use: Yes    Comment: Beer- 40 oz. every day.   Drug use: Not Currently    Types: Cocaine, Marijuana    Comment: Sometimes.   Sexual activity: Not on file  Other Topics Concern   Not on file  Social History Narrative   Lives in [place] for last 10 years with "friend"  Brady Church      Is not married and does not have kids.  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   Social Determinants of Health   Financial Resource Strain: High Risk (10/11/2022)   Overall Financial Resource Strain (CARDIA)    Difficulty of Paying Living Expenses: Very hard  Food Insecurity: Food Insecurity Present (10/11/2022)   Hunger Vital Sign    Worried About Running Out of Food in the Last Year: Often true    Ran Out of Food in the Last Year: Often true  Transportation Needs: Unmet Transportation Needs (10/11/2022)   PRAPARE - Administrator, Civil Service (Medical): Yes    Lack of Transportation (Non-Medical): Yes  Physical Activity: Not on file  Stress: Not on file  Social Connections: Socially Isolated (10/11/2022)   Social Connection and Isolation Panel [NHANES]    Frequency of Communication with Friends and Family: Never    Frequency of  Social Gatherings with Friends and Family: Never    Attends Religious Services: Never    Database administrator or Organizations: No    Attends Engineer, structural: Never    Marital Status: Never married     Objective:  Physical Exam:  Vitals:   08/29/23 0956  BP: 115/64  Pulse: 80  Temp: 98 F (36.7 C)  TempSrc: Oral  SpO2: 100%  Weight: 215 lb 8 oz (97.8 kg)  Height: 5\' 8"  (1.727 m)    Constitutional: NAD Cardiovascular: RRR, no m/r/g Pulmonary/Chest: Clear bilaterally, normal effort Psychiatric: Normal mood and affect  Assessment & Plan:   Housing instability Currently on un-homed, has difficulty affording and accessing his medications. Not taking any of his prescriptions currently. Has an active alcohol use disorder but not interested in quitting. He was provided with dry goods from our donated pantry today. He is established with the [facility].   Diabetes mellitus with diabetic cataract (HCC) Hgb A1C 7.8, not taking his empagliflozin-metformin. I did some motivational interviewing today in the context of his prior stroke, diabetic retinopathy and neuropathy. If needling to prioritize medications due to affordability; encouraged he prioritize this one, his ASA, and his statin.   CVA (cerebral vascular accident) [facility]) Encouraged he restart ASA 81 mg daily and lipitor. These prescriptions are already at his pharmacy with plenty of refills. Encouraged him to pick them up.   Hypertension Currently well controlled off medication. He doesn't eat much salt (has housing and food insecurity) and walks 6-8 miles a day. Will d/c lisinopril from his medication list and monitor.   Nephrolithiasis Seen in the ED for this recently; CT showed a 3 x 4 mm right vesicoureteric junction calculus. He has since passed this on his own an reports resolution of symptoms. Denies lower urinary track symptoms.

## 2023-08-30 DIAGNOSIS — N200 Calculus of kidney: Secondary | ICD-10-CM | POA: Insufficient documentation

## 2023-08-30 NOTE — Assessment & Plan Note (Signed)
Encouraged he restart ASA 81 mg daily and lipitor. These prescriptions are already at his pharmacy with plenty of refills. Encouraged him to pick them up.

## 2023-08-30 NOTE — Assessment & Plan Note (Signed)
Seen in the ED for this recently; CT showed a 3 x 4 mm right vesicoureteric junction calculus. He has since passed this on his own an reports resolution of symptoms. Denies lower urinary track symptoms.

## 2023-08-30 NOTE — Assessment & Plan Note (Signed)
Hgb A1C 7.8, not taking his empagliflozin-metformin. I did some motivational interviewing today in the context of his prior stroke, diabetic retinopathy and neuropathy. If needling to prioritize medications due to affordability; encouraged he prioritize this one, his ASA, and his statin.

## 2023-08-30 NOTE — Assessment & Plan Note (Signed)
Currently on un-homed, has difficulty affording and accessing his medications. Not taking any of his prescriptions currently. Has an active alcohol use disorder but not interested in quitting. He was provided with dry goods from our donated pantry today. He is established with the [facility].

## 2023-08-30 NOTE — Assessment & Plan Note (Signed)
Currently well controlled off medication. He doesn't eat much salt (has housing and food insecurity) and walks 6-8 miles a day. Will d/c lisinopril from his medication list and monitor.

## 2023-10-01 ENCOUNTER — Emergency Department (HOSPITAL_COMMUNITY): Payer: MEDICAID

## 2023-10-01 ENCOUNTER — Other Ambulatory Visit: Payer: Self-pay

## 2023-10-01 ENCOUNTER — Observation Stay (HOSPITAL_COMMUNITY)
Admission: EM | Admit: 2023-10-01 | Discharge: 2023-10-02 | Disposition: A | Discharge status: Home / Self Care | Payer: MEDICAID | Attending: Internal Medicine | Admitting: Internal Medicine

## 2023-10-01 ENCOUNTER — Encounter (HOSPITAL_COMMUNITY): Payer: Self-pay

## 2023-10-01 ENCOUNTER — Observation Stay (HOSPITAL_BASED_OUTPATIENT_CLINIC_OR_DEPARTMENT_OTHER): Payer: MEDICAID

## 2023-10-01 DIAGNOSIS — F1020 Alcohol dependence, uncomplicated: Secondary | ICD-10-CM | POA: Diagnosis not present

## 2023-10-01 DIAGNOSIS — I10 Essential (primary) hypertension: Secondary | ICD-10-CM | POA: Insufficient documentation

## 2023-10-01 DIAGNOSIS — H548 Legal blindness, as defined in USA: Secondary | ICD-10-CM | POA: Insufficient documentation

## 2023-10-01 DIAGNOSIS — H409 Unspecified glaucoma: Secondary | ICD-10-CM | POA: Diagnosis not present

## 2023-10-01 DIAGNOSIS — Z7984 Long term (current) use of oral hypoglycemic drugs: Secondary | ICD-10-CM | POA: Diagnosis not present

## 2023-10-01 DIAGNOSIS — I160 Hypertensive urgency: Secondary | ICD-10-CM | POA: Insufficient documentation

## 2023-10-01 DIAGNOSIS — R0602 Shortness of breath: Secondary | ICD-10-CM

## 2023-10-01 DIAGNOSIS — I63512 Cerebral infarction due to unspecified occlusion or stenosis of left middle cerebral artery: Secondary | ICD-10-CM

## 2023-10-01 DIAGNOSIS — Z7901 Long term (current) use of anticoagulants: Secondary | ICD-10-CM | POA: Insufficient documentation

## 2023-10-01 DIAGNOSIS — E119 Type 2 diabetes mellitus without complications: Secondary | ICD-10-CM | POA: Insufficient documentation

## 2023-10-01 DIAGNOSIS — J9601 Acute respiratory failure with hypoxia: Secondary | ICD-10-CM | POA: Diagnosis present

## 2023-10-01 DIAGNOSIS — E1136 Type 2 diabetes mellitus with diabetic cataract: Secondary | ICD-10-CM

## 2023-10-01 DIAGNOSIS — J810 Acute pulmonary edema: Principal | ICD-10-CM

## 2023-10-01 DIAGNOSIS — F1290 Cannabis use, unspecified, uncomplicated: Secondary | ICD-10-CM | POA: Diagnosis not present

## 2023-10-01 DIAGNOSIS — E785 Hyperlipidemia, unspecified: Secondary | ICD-10-CM | POA: Diagnosis not present

## 2023-10-01 DIAGNOSIS — R0609 Other forms of dyspnea: Secondary | ICD-10-CM | POA: Diagnosis not present

## 2023-10-01 DIAGNOSIS — Z1152 Encounter for screening for COVID-19: Secondary | ICD-10-CM | POA: Diagnosis not present

## 2023-10-01 LAB — I-STAT ARTERIAL BLOOD GAS, ED
Acid-Base Excess: 0 mmol/L (ref 0.0–2.0)
Bicarbonate: 26 mmol/L (ref 20.0–28.0)
Calcium, Ion: 1.22 mmol/L (ref 1.15–1.40)
HCT: 39 % (ref 39.0–52.0)
Hemoglobin: 13.3 g/dL (ref 13.0–17.0)
O2 Saturation: 100 %
Potassium: 3.4 mmol/L — ABNORMAL LOW (ref 3.5–5.1)
Sodium: 137 mmol/L (ref 135–145)
TCO2: 27 mmol/L (ref 22–32)
pCO2 arterial: 44.5 mm[Hg] (ref 32–48)
pH, Arterial: 7.376 (ref 7.35–7.45)
pO2, Arterial: 276 mm[Hg] — ABNORMAL HIGH (ref 83–108)

## 2023-10-01 LAB — CBC WITH DIFFERENTIAL/PLATELET
Abs Immature Granulocytes: 0.02 10*3/uL (ref 0.00–0.07)
Basophils Absolute: 0.1 10*3/uL (ref 0.0–0.1)
Basophils Relative: 1 %
Eosinophils Absolute: 0.3 10*3/uL (ref 0.0–0.5)
Eosinophils Relative: 4 %
HCT: 42 % (ref 39.0–52.0)
Hemoglobin: 14.5 g/dL (ref 13.0–17.0)
Immature Granulocytes: 0 %
Lymphocytes Relative: 46 %
Lymphs Abs: 3 10*3/uL (ref 0.7–4.0)
MCH: 32.6 pg (ref 26.0–34.0)
MCHC: 34.5 g/dL (ref 30.0–36.0)
MCV: 94.4 fL (ref 80.0–100.0)
Monocytes Absolute: 0.5 10*3/uL (ref 0.1–1.0)
Monocytes Relative: 8 %
Neutro Abs: 2.6 10*3/uL (ref 1.7–7.7)
Neutrophils Relative %: 41 %
Platelets: 351 10*3/uL (ref 150–400)
RBC: 4.45 MIL/uL (ref 4.22–5.81)
RDW: 14.2 % (ref 11.5–15.5)
WBC: 6.5 10*3/uL (ref 4.0–10.5)
nRBC: 0 % (ref 0.0–0.2)

## 2023-10-01 LAB — ECHOCARDIOGRAM COMPLETE
AR max vel: 2.38 cm2
AV Area VTI: 2.42 cm2
AV Area mean vel: 2.34 cm2
AV Mean grad: 3 mm[Hg]
AV Peak grad: 4.7 mm[Hg]
Ao pk vel: 1.08 m/s
Area-P 1/2: 3.17 cm2
Height: 69 in
S' Lateral: 5 cm
Weight: 3440 [oz_av]

## 2023-10-01 LAB — COMPREHENSIVE METABOLIC PANEL
ALT: 30 U/L (ref 0–44)
AST: 28 U/L (ref 15–41)
Albumin: 3.4 g/dL — ABNORMAL LOW (ref 3.5–5.0)
Alkaline Phosphatase: 62 U/L (ref 38–126)
Anion gap: 13 (ref 5–15)
BUN: 15 mg/dL (ref 6–20)
CO2: 22 mmol/L (ref 22–32)
Calcium: 8.7 mg/dL — ABNORMAL LOW (ref 8.9–10.3)
Chloride: 101 mmol/L (ref 98–111)
Creatinine, Ser: 1.04 mg/dL (ref 0.61–1.24)
GFR, Estimated: >60 mL/min (ref >60)
Glucose, Bld: 198 mg/dL — ABNORMAL HIGH (ref 70–99)
Potassium: 4.2 mmol/L (ref 3.5–5.1)
Sodium: 136 mmol/L (ref 135–145)
Total Bilirubin: 0.7 mg/dL (ref <1.2)
Total Protein: 6.8 g/dL (ref 6.5–8.1)

## 2023-10-01 LAB — ETHANOL: Alcohol, Ethyl (B): <10 mg/dL (ref <10)

## 2023-10-01 LAB — PROCALCITONIN: Procalcitonin: <0.1 ng/mL

## 2023-10-01 LAB — I-STAT CG4 LACTIC ACID, ED: Lactic Acid, Venous: 1.1 mmol/L (ref 0.5–1.9)

## 2023-10-01 LAB — TROPONIN I (HIGH SENSITIVITY)
Troponin I (High Sensitivity): 33 ng/L — ABNORMAL HIGH (ref <18)
Troponin I (High Sensitivity): 35 ng/L — ABNORMAL HIGH (ref <18)
Troponin I (High Sensitivity): 27 ng/L — ABNORMAL HIGH (ref <18)

## 2023-10-01 LAB — RESPIRATORY PANEL BY PCR

## 2023-10-01 LAB — CBG MONITORING, ED: Glucose-Capillary: 202 mg/dL — ABNORMAL HIGH (ref 70–99)

## 2023-10-01 LAB — SARS CORONAVIRUS 2 BY RT PCR: SARS Coronavirus 2 by RT PCR: NEGATIVE

## 2023-10-01 LAB — BRAIN NATRIURETIC PEPTIDE: B Natriuretic Peptide: 207.8 pg/mL — ABNORMAL HIGH (ref 0.0–100.0)

## 2023-10-01 LAB — HIV ANTIBODY (ROUTINE TESTING W REFLEX): HIV Screen 4th Generation wRfx: NONREACTIVE

## 2023-10-01 MED ORDER — ATORVASTATIN CALCIUM 80 MG PO TABS
80.0000 mg | ORAL_TABLET | Freq: Every day | ORAL | Status: DC
  Administered 2023-10-01: 80 mg via ORAL
  Filled 2023-10-01: qty 1

## 2023-10-01 MED ORDER — CEFTRIAXONE SODIUM 1 G IJ SOLR
1.0000 g | Freq: Once | INTRAMUSCULAR | Status: AC
  Administered 2023-10-01: 1 g via INTRAVENOUS
  Filled 2023-10-01: qty 10

## 2023-10-01 MED ORDER — FOLIC ACID 1 MG PO TABS
1.0000 mg | ORAL_TABLET | Freq: Every day | ORAL | Status: DC
  Administered 2023-10-01 – 2023-10-02 (×2): 1 mg via ORAL
  Filled 2023-10-01 (×2): qty 1

## 2023-10-01 MED ORDER — EMPAGLIFLOZIN-METFORMIN HCL 5-500 MG PO TABS: 1.0000 | ORAL_TABLET | Freq: Two times a day (BID) | ORAL | Status: DC

## 2023-10-01 MED ORDER — THIAMINE HCL 100 MG/ML IJ SOLN: 100.0000 mg | Freq: Every day | INTRAMUSCULAR | Status: DC

## 2023-10-01 MED ORDER — NETARSUDIL-LATANOPROST 0.02-0.005 % OP SOLN: 1.0000 [drp] | Freq: Every day | OPHTHALMIC | Status: DC

## 2023-10-01 MED ORDER — BRIMONIDINE TARTRATE 0.15 % OP SOLN
1.0000 [drp] | Freq: Two times a day (BID) | OPHTHALMIC | Status: DC
  Administered 2023-10-01 – 2023-10-02 (×3): 1 [drp] via OPHTHALMIC
  Filled 2023-10-01 (×2): qty 5

## 2023-10-01 MED ORDER — THIAMINE MONONITRATE 100 MG PO TABS
100.0000 mg | ORAL_TABLET | Freq: Every day | ORAL | Status: DC
  Administered 2023-10-01 – 2023-10-02 (×2): 100 mg via ORAL
  Filled 2023-10-01 (×2): qty 1

## 2023-10-01 MED ORDER — FLUOROMETHOLONE 0.1 % OP SUSP
1.0000 [drp] | Freq: Every day | OPHTHALMIC | Status: DC
  Administered 2023-10-01 – 2023-10-02 (×2): 1 [drp] via OPHTHALMIC
  Filled 2023-10-01 (×2): qty 5

## 2023-10-01 MED ORDER — FUROSEMIDE 10 MG/ML IJ SOLN
40.0000 mg | Freq: Once | INTRAMUSCULAR | Status: AC
  Administered 2023-10-01: 40 mg via INTRAVENOUS
  Filled 2023-10-01: qty 4

## 2023-10-01 MED ORDER — ASPIRIN 81 MG PO TBEC
81.0000 mg | DELAYED_RELEASE_TABLET | Freq: Every day | ORAL | Status: DC
  Administered 2023-10-01 – 2023-10-02 (×2): 81 mg via ORAL
  Filled 2023-10-01 (×2): qty 1

## 2023-10-01 MED ORDER — AZITHROMYCIN 250 MG PO TABS
500.0000 mg | ORAL_TABLET | Freq: Once | ORAL | Status: AC
  Administered 2023-10-01: 500 mg via ORAL
  Filled 2023-10-01: qty 2

## 2023-10-01 MED ORDER — RIVAROXABAN 10 MG PO TABS
10.0000 mg | ORAL_TABLET | Freq: Every day | ORAL | Status: DC
  Administered 2023-10-01 – 2023-10-02 (×2): 10 mg via ORAL
  Filled 2023-10-01 (×2): qty 1

## 2023-10-01 MED ORDER — POTASSIUM CHLORIDE CRYS ER 20 MEQ PO TBCR: 40.0000 meq | EXTENDED_RELEASE_TABLET | Freq: Once | ORAL | Status: DC

## 2023-10-01 MED ORDER — CARVEDILOL 3.125 MG PO TABS
3.1250 mg | ORAL_TABLET | Freq: Two times a day (BID) | ORAL | Status: DC
  Administered 2023-10-01 – 2023-10-02 (×2): 3.125 mg via ORAL
  Filled 2023-10-01 (×2): qty 1

## 2023-10-01 MED ORDER — ADULT MULTIVITAMIN W/MINERALS CH
1.0000 | ORAL_TABLET | Freq: Every day | ORAL | Status: DC
  Administered 2023-10-01 – 2023-10-02 (×2): 1 via ORAL
  Filled 2023-10-01 (×2): qty 1

## 2023-10-01 MED ORDER — LOSARTAN POTASSIUM 50 MG PO TABS
25.0000 mg | ORAL_TABLET | Freq: Every day | ORAL | Status: DC
  Administered 2023-10-01 – 2023-10-02 (×2): 25 mg via ORAL
  Filled 2023-10-01 (×2): qty 1

## 2023-10-01 NOTE — Consult Note (Signed)
Cardiology Consultation   Patient ID: Brady Church MRN: [id number]; DOB: August 23, 1961  Admit date: 10/01/2023 Date of Consult: 10/01/2023  PCP:  [name], MD   [facility] Providers Cardiologist:  None     Patient Profile:   Brady Church is a 62 y.o. male with a hx of polysubstance use disorder (alcohol, cocaine, tobacco use), systolic heart failure, type 2 DM, HLD, HTN, glaucoma, legal blindness,  who is being seen 10/01/2023 for the evaluation of CHF at the request of Dr. Heide Spark  History of Present Illness:   [name] is a 62 year old male with above medical history. Per chart review, patient previously underwent echocardiogram 01/12/15 that showed EF 40-45%, grade I DD. Underwent nuclear stress test on 08/25/15 that showed mild apical inferior thinning without scar or ischemia. It was suspected that CHF was due to alcohol use. Treated with carvedilol and lisinopril. Had an episode of syncope in 01/2017. Underwent echocardiogram 02/17/17 that showed EF 65-70%, no regional wall motion abnormalities, grade I DD. Cardiac monitor in 04/27/17 showed normal sinus rhythm. Patient has not been seen by cardiology since 2018  Patient presented to the ED on 11/4 complaining of gradual onset of shortness of breath. In the ED, initial BP 255/135, oxygen saturation 85% on room air. Started on CPAP and given 2 tablets of SL Nitroglycerin with improvement. EKG showed sinus rhythm, LVH. Labs showed K 4.2, creatinine 1.04, WBC 6.5, hemoglobin 14.5, platelets 351. BNP elevated to 207.8. hsTn 33, 35. CXR showed multifocal right lung pneumonia. CT head showed no acute intracranial abnormality.   Patient was admitted to the internal medicine teaching service. Started on IV lasix. On interview, patient reports that for the past week he has been having progressive shortness of breath, ankle edema. Has also noticed a persistent, dry cough. Denies chest pain. He has been receiving IV lasix,  and feels like his breathing is improving.    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 Stopped in 2010   Tobacco use disorder 10/07/2014    Past Surgical History:  Procedure Laterality Date   CATARACT EXTRACTION  01/08/2012   Right eye   EYE SURGERY     x 4 total      Home Medications:  Prior to Admission medications   Medication Sig Start Date End Date Taking? Authorizing Provider  aspirin EC 81 MG tablet Take 1 tablet (81 mg total) by mouth daily. Swallow whole. Patient not taking: Reported on 10/01/2023 08/29/23 08/28/24  [name], MD  atorvastatin (LIPITOR) 80 MG tablet Take 1 tablet (80 mg total) by mouth daily at 6 PM. Patient not taking: Reported on 10/01/2023 10/11/22   [name], MD  brimonidine (ALPHAGAN) 0.15 % ophthalmic solution Place 1 drop into both eyes 2 (two) times daily. Patient not taking: Reported on 10/01/2023 08/30/22   Lauree Chandler, NP  Empagliflozin-metFORMIN HCl 5-500 MG TABS Take 1 tablet by mouth 2 (two) times daily. Patient not taking: Reported on 10/01/2023 08/15/23   [name], MD  fluorometholone (FML) 0.1 % ophthalmic suspension Place 1 drop into both eyes daily. Patient  not taking: Reported on 10/01/2023 08/31/22   Lauree Chandler, NP  LUMIGAN 0.01 % SOLN Place 1 drop into the right eye at bedtime. Patient not taking: Reported on 10/01/2023 04/06/23   [provider]  RHOPRESSA 0.02 % SOLN Apply 1 drop to eye at bedtime. Use in the evening Patient not taking: Reported on 10/01/2023 04/06/23   [provider]  ROCKLATAN 0.02-0.005 % SOLN Place 1 drop into the right eye at  bedtime. Patient not taking: Reported on 10/01/2023 08/30/22   Lauree Chandler, NP  [facility] 1-0.2 % SUSP Place 1 drop into the right eye 2 (two) times daily. Patient not taking: Reported on 10/01/2023 04/06/23   [provider]  timolol (TIMOPTIC) 0.5 % ophthalmic solution Place 1 drop into the right eye 2 (two) times daily. Patient not taking: Reported on 10/01/2023 04/18/23   [provider]    Inpatient Medications: Scheduled Meds:  aspirin EC  81 mg Oral Daily   atorvastatin  80 mg Oral q1800   brimonidine  1 drop Both Eyes BID   fluorometholone  1 drop Both Eyes Daily   folic acid  1 mg Oral Daily   furosemide  40 mg Intravenous Once   multivitamin with minerals  1 tablet Oral Daily   rivaroxaban  10 mg Oral Daily   thiamine  100 mg Oral Daily   Or   thiamine  100 mg Intravenous Daily   Continuous Infusions:  PRN Meds:   Allergies:   No Known Allergies  Social History:   Social History   Socioeconomic History   Marital status: Single    Spouse name: Not on file   Number of children: Not on file   Years of education: 12   Highest education level: Not on file  Occupational History   Not on file  Tobacco Use   Smoking status: Former    Current packs/day: 0.00    Types: Cigarettes    Quit date: 02/18/2017    Years since quitting: 6.6   Smokeless tobacco: Never  Substance and Sexual Activity   Alcohol use: Yes    Comment: Beer- 40 oz. every day.   Drug use: Not Currently    Types: Cocaine, Marijuana    Comment: Sometimes.   Sexual activity: Not on file  Other Topics Concern   Not on file  Social History Narrative   Lives in [place] for last 10 years with "friend"  Brady Church      Is not married and does not have kids.   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   Social Determinants of Health   Financial Resource Strain: High Risk (10/11/2022)   Overall Financial  Resource Strain (CARDIA)    Difficulty of Paying Living Expenses: Very hard  Food Insecurity: Food Insecurity Present (10/01/2023)   Hunger Vital Sign    Worried About Running Out of Food in the Last Year: Often true    Ran Out of Food in the Last Year: Often true  Transportation Needs: Unmet Transportation Needs (10/01/2023)   PRAPARE - Administrator, Civil Service (Medical): Yes    Lack of Transportation (Non-Medical): Yes  Physical Activity: Not on file  Stress: Not on file  Social Connections: Socially Isolated (10/11/2022)   Social Connection and Isolation Panel [NHANES]    Frequency of Communication with Friends and Family: Never    Frequency of Social  Gatherings with Friends and Family: Never    Attends Religious Services: Never    Database administrator or Organizations: No    Attends Banker Meetings: Never    Marital Status: Never married  Intimate Partner Violence: Not At Risk (10/01/2023)   Humiliation, Afraid, Rape, and Kick questionnaire    Fear of Current or Ex-Partner: No    Emotionally Abused: No    Physically Abused: No    Sexually Abused: No    Family History:    Family History  Problem Relation Age of Onset   Coronary artery disease Mother 1       died of MI   Cancer Father 61       Some GI cancer. Not sure if it was Colon Cancer or not.   Hypertension Father    Diabetes Maternal Grandmother      ROS:  Please see the history of present illness.   All other ROS reviewed and negative.     Physical Exam/Data:   Vitals:   10/01/23 0847 10/01/23 0915 10/01/23 0923 10/01/23 1010  BP:  (!) 144/101  (!) 147/109  Pulse: 61 70  68  Resp: 13 11  14   Temp:    97.8 F (36.6 C)  TempSrc:    Oral  SpO2: 94% 94% 90% 100%  Weight:      Height:       No intake or output data in the 24 hours ending 10/01/23 1546    10/01/2023    1:59 AM 08/29/2023    9:56 AM 07/27/2023    8:03 AM  Last 3 Weights  Weight (lbs) 215 lb 215 lb 8 oz 230  lb  Weight (kg) 97.523 kg 97.75 kg 104.327 kg     Body mass index is 31.75 kg/m.  General:  Well nourished, well developed, in no acute distress. Laying in the bed with head elevated  HEENT: normal Neck: no JVD Vascular: Radial pulses 2+ bilaterally Cardiac:  normal S1, S2; RRR; no murmur Lungs:  Fine crackles in bilateral lung bases. Normal work of breathing on room air  Abd: soft, nontender Ext: no edema in BLE Musculoskeletal:  No deformities, BUE and BLE strength normal and equal Skin: warm and dry  Neuro: no focal abnormalities noted Psych:  Normal affect   EKG:  The EKG was personally reviewed and demonstrates:  Sinus rhythm, TWI in the lateral leads  Telemetry:  Telemetry was personally reviewed and demonstrates:  NA   Relevant CV Studies: Cardiac Studies & Procedures     STRESS TESTS  MYOCARDIAL PERFUSION IMAGING 08/25/2015  Interpretation Summary  The left ventricular ejection fraction is moderately decreased (30-44%).  Nuclear stress EF: 40%.  There was no ST segment deviation noted during stress.  Defect 1: There is a small defect of mild severity.  This is an intermediate risk study.  Intermediate risk lexiscan nuclear study demonstrating a mildly dilated LV with global hypocontractility (EF 40%) with mild apical inferior thinning without scar or ischemia.   ECHOCARDIOGRAM  ECHOCARDIOGRAM COMPLETE 10/01/2023  Narrative ECHOCARDIOGRAM REPORT    Patient Name:   Brady Church Date of Exam: 10/01/2023 Medical Rec #:  [id number]       Height:       69.0 in Accession #:    [id number]      Weight:       215.0 lb Date of Birth:  27-Oct-1961      BSA:  2.130 m Patient Age:    61 years        BP:           149/109 mmHg Patient Gender: M               HR:           77 bpm. Exam Location:  Inpatient  Procedure: 2D Echo, Cardiac Doppler and Color Doppler  Indications:    Dyspnea R06.00  History:        Patient has no prior history of Echocardiogram  examinations. Risk Factors:Diabetes and Hypertension.  Sonographer:    Darlys Gales Referring Phys: [id number] NISCHAL NARENDRA  IMPRESSIONS   1. Left ventricular ejection fraction, by estimation, is 35 to 40%. The left ventricle has moderately decreased function. The left ventricle demonstrates global hypokinesis. There is mild concentric left ventricular hypertrophy. Left ventricular diastolic parameters are consistent with Grade II diastolic dysfunction (pseudonormalization). 2. Right ventricular systolic function is normal. The right ventricular size is normal. 3. The mitral valve is normal in structure. Trivial mitral valve regurgitation. No evidence of mitral stenosis. 4. The aortic valve is tricuspid. There is mild calcification of the aortic valve. Aortic valve regurgitation is not visualized. Aortic valve sclerosis/calcification is present, without any evidence of aortic stenosis. 5. The inferior vena cava is normal in size with greater than 50% respiratory variability, suggesting right atrial pressure of 3 mmHg.  FINDINGS Left Ventricle: Left ventricular ejection fraction, by estimation, is 35 to 40%. The left ventricle has moderately decreased function. The left ventricle demonstrates global hypokinesis. The left ventricular internal cavity size was normal in size. There is mild concentric left ventricular hypertrophy. Left ventricular diastolic parameters are consistent with Grade II diastolic dysfunction (pseudonormalization).  Right Ventricle: The right ventricular size is normal. No increase in right ventricular wall thickness. Right ventricular systolic function is normal.  Left Atrium: Left atrial size was normal in size.  Right Atrium: Right atrial size was normal in size.  Pericardium: There is no evidence of pericardial effusion.  Mitral Valve: The mitral valve is normal in structure. Trivial mitral valve regurgitation. No evidence of mitral valve stenosis.  Tricuspid  Valve: The tricuspid valve is normal in structure. Tricuspid valve regurgitation is not demonstrated. No evidence of tricuspid stenosis.  Aortic Valve: The aortic valve is tricuspid. There is mild calcification of the aortic valve. Aortic valve regurgitation is not visualized. Aortic valve sclerosis/calcification is present, without any evidence of aortic stenosis. Aortic valve mean gradient measures 3.0 mmHg. Aortic valve peak gradient measures 4.7 mmHg. Aortic valve area, by VTI measures 2.42 cm.  Pulmonic Valve: The pulmonic valve was not well visualized. Pulmonic valve regurgitation is not visualized. No evidence of pulmonic stenosis.  Aorta: The aortic root is normal in size and structure.  Venous: The inferior vena cava is normal in size with greater than 50% respiratory variability, suggesting right atrial pressure of 3 mmHg.  IAS/Shunts: No atrial level shunt detected by color flow Doppler.   LEFT VENTRICLE PLAX 2D LVIDd:         6.00 cm   Diastology LVIDs:         5.00 cm   LV e' medial:    4.13 cm/s LV PW:         1.20 cm   LV E/e' medial:  18.4 LV IVS:        1.30 cm   LV e' lateral:   5.22 cm/s LVOT diam:     1.90  cm   LV E/e' lateral: 14.5 LV SV:         55 LV SV Index:   26 LVOT Area:     2.84 cm   RIGHT VENTRICLE RV S prime:     15.40 cm/s TAPSE (M-mode): 1.9 cm  LEFT ATRIUM             Index        RIGHT ATRIUM           Index LA Vol (A2C):   34.5 ml 16.19 ml/m  RA Area:     12.40 cm LA Vol (A4C):   30.8 ml 14.46 ml/m  RA Volume:   25.70 ml  12.06 ml/m LA Biplane Vol: 35.9 ml 16.85 ml/m AORTIC VALVE AV Area (Vmax):    2.38 cm AV Area (Vmean):   2.34 cm AV Area (VTI):     2.42 cm AV Vmax:           108.00 cm/s AV Vmean:          85.000 cm/s AV VTI:            0.227 m AV Peak Grad:      4.7 mmHg AV Mean Grad:      3.0 mmHg LVOT Vmax:         90.80 cm/s LVOT Vmean:        70.200 cm/s LVOT VTI:          0.194 m LVOT/AV VTI ratio: 0.85  AORTA Ao  Root diam: 3.70 cm  MITRAL VALVE MV Area (PHT): 3.17 cm    SHUNTS MV Decel Time: 239 msec    Systemic VTI:  0.19 m MV E velocity: 75.90 cm/s  Systemic Diam: 1.90 cm MV A velocity: 68.60 cm/s MV E/A ratio:  1.11  Arvilla Meres MD Electronically signed by Arvilla Meres MD Signature Date/Time: 10/01/2023/1:37:34 PM    Final    MONITORS  CARDIAC EVENT MONITOR 04/27/2017  Narrative  Normal sinus rhythm  Normal study            Laboratory Data:  High Sensitivity Troponin:   Recent Labs  Lab 10/01/23 0158 10/01/23 0358 10/01/23 1344  TROPONINIHS 33* 35* 27*     Chemistry Recent Labs  Lab 10/01/23 0158 10/01/23 0226  NA 136 137  K 4.2 3.4*  CL 101  --   CO2 22  --   GLUCOSE 198*  --   BUN 15  --   CREATININE 1.04  --   CALCIUM 8.7*  --   GFRNONAA >60  --   ANIONGAP 13  --     Recent Labs  Lab 10/01/23 0158  PROT 6.8  ALBUMIN 3.4*  AST 28  ALT 30  ALKPHOS 62  BILITOT 0.7   Lipids No results for input(s): "CHOL", "TRIG", "HDL", "LABVLDL", "LDLCALC", "CHOLHDL" in the last 168 hours.  Hematology Recent Labs  Lab 10/01/23 0158 10/01/23 0226  WBC 6.5  --   RBC 4.45  --   HGB 14.5 13.3  HCT 42.0 39.0  MCV 94.4  --   MCH 32.6  --   MCHC 34.5  --   RDW 14.2  --   PLT 351  --    Thyroid No results for input(s): "TSH", "FREET4" in the last 168 hours.  BNP Recent Labs  Lab 10/01/23 0155  BNP 207.8*    DDimer No results for input(s): "DDIMER" in the last 168 hours.   Radiology/Studies:  ECHOCARDIOGRAM COMPLETE  Result Date: 10/01/2023    ECHOCARDIOGRAM REPORT   Patient Name:   KARANVEER RAMAKRISHNAN Date of Exam: 10/01/2023 Medical Rec #:  [id number]       Height:       69.0 in Accession #:    [id number]      Weight:       215.0 lb Date of Birth:  [birth year]      BSA:          2.130 m Patient Age:    61 years        BP:           149/109 mmHg Patient Gender: M               HR:           77 bpm. Exam Location:  Inpatient Procedure: 2D Echo,  Cardiac Doppler and Color Doppler Indications:    Dyspnea R06.00  History:        Patient has no prior history of Echocardiogram examinations.                 Risk Factors:Diabetes and Hypertension.  Sonographer:    Darlys Gales Referring Phys: [id number] NISCHAL NARENDRA IMPRESSIONS  1. Left ventricular ejection fraction, by estimation, is 35 to 40%. The left ventricle has moderately decreased function. The left ventricle demonstrates global hypokinesis. There is mild concentric left ventricular hypertrophy. Left ventricular diastolic parameters are consistent with Grade II diastolic dysfunction (pseudonormalization).  2. Right ventricular systolic function is normal. The right ventricular size is normal.  3. The mitral valve is normal in structure. Trivial mitral valve regurgitation. No evidence of mitral stenosis.  4. The aortic valve is tricuspid. There is mild calcification of the aortic valve. Aortic valve regurgitation is not visualized. Aortic valve sclerosis/calcification is present, without any evidence of aortic stenosis.  5. The inferior vena cava is normal in size with greater than 50% respiratory variability, suggesting right atrial pressure of 3 mmHg. FINDINGS  Left Ventricle: Left ventricular ejection fraction, by estimation, is 35 to 40%. The left ventricle has moderately decreased function. The left ventricle demonstrates global hypokinesis. The left ventricular internal cavity size was normal in size. There is mild concentric left ventricular hypertrophy. Left ventricular diastolic parameters are consistent with Grade II diastolic dysfunction (pseudonormalization). Right Ventricle: The right ventricular size is normal. No increase in right ventricular wall thickness. Right ventricular systolic function is normal. Left Atrium: Left atrial size was normal in size. Right Atrium: Right atrial size was normal in size. Pericardium: There is no evidence of pericardial effusion. Mitral Valve: The mitral  valve is normal in structure. Trivial mitral valve regurgitation. No evidence of mitral valve stenosis. Tricuspid Valve: The tricuspid valve is normal in structure. Tricuspid valve regurgitation is not demonstrated. No evidence of tricuspid stenosis. Aortic Valve: The aortic valve is tricuspid. There is mild calcification of the aortic valve. Aortic valve regurgitation is not visualized. Aortic valve sclerosis/calcification is present, without any evidence of aortic stenosis. Aortic valve mean gradient measures 3.0 mmHg. Aortic valve peak gradient measures 4.7 mmHg. Aortic valve area, by VTI measures 2.42 cm. Pulmonic Valve: The pulmonic valve was not well visualized. Pulmonic valve regurgitation is not visualized. No evidence of pulmonic stenosis. Aorta: The aortic root is normal in size and structure. Venous: The inferior vena cava is normal in size with greater than 50% respiratory variability, suggesting right atrial pressure of 3 mmHg. IAS/Shunts: No atrial level shunt detected by color flow Doppler.  LEFT  VENTRICLE PLAX 2D LVIDd:         6.00 cm   Diastology LVIDs:         5.00 cm   LV e' medial:    4.13 cm/s LV PW:         1.20 cm   LV E/e' medial:  18.4 LV IVS:        1.30 cm   LV e' lateral:   5.22 cm/s LVOT diam:     1.90 cm   LV E/e' lateral: 14.5 LV SV:         55 LV SV Index:   26 LVOT Area:     2.84 cm  RIGHT VENTRICLE RV S prime:     15.40 cm/s TAPSE (M-mode): 1.9 cm LEFT ATRIUM             Index        RIGHT ATRIUM           Index LA Vol (A2C):   34.5 ml 16.19 ml/m  RA Area:     12.40 cm LA Vol (A4C):   30.8 ml 14.46 ml/m  RA Volume:   25.70 ml  12.06 ml/m LA Biplane Vol: 35.9 ml 16.85 ml/m  AORTIC VALVE AV Area (Vmax):    2.38 cm AV Area (Vmean):   2.34 cm AV Area (VTI):     2.42 cm AV Vmax:           108.00 cm/s AV Vmean:          85.000 cm/s AV VTI:            0.227 m AV Peak Grad:      4.7 mmHg AV Mean Grad:      3.0 mmHg LVOT Vmax:         90.80 cm/s LVOT Vmean:        70.200 cm/s LVOT  VTI:          0.194 m LVOT/AV VTI ratio: 0.85  AORTA Ao Root diam: 3.70 cm MITRAL VALVE MV Area (PHT): 3.17 cm    SHUNTS MV Decel Time: 239 msec    Systemic VTI:  0.19 m MV E velocity: 75.90 cm/s  Systemic Diam: 1.90 cm MV A velocity: 68.60 cm/s MV E/A ratio:  1.11 Arvilla Meres MD Electronically signed by Arvilla Meres MD Signature Date/Time: 10/01/2023/1:37:34 PM    Final    CT Head Wo Contrast  Result Date: 10/01/2023 CLINICAL DATA:  62 year old male with altered mental status. Shortness of breath, hypoxia. Hypertensive, 255 systolic. EXAM: CT HEAD WITHOUT CONTRAST TECHNIQUE: Contiguous axial images were obtained from the base of the skull through the vertex without intravenous contrast. RADIATION DOSE REDUCTION: This exam was performed according to the departmental dose-optimization program which includes automated exposure control, adjustment of the mA and/or kV according to patient size and/or use of iterative reconstruction technique. COMPARISON:  Brain MRI 02/16/2017.  Head CT 10/10/2021. FINDINGS: Brain: Chronic encephalomalacia posterior left MCA territory. Cerebral volume stable since 2022. No midline shift, mass effect, or evidence of intracranial mass lesion. Mild ex vacuo enlargement of the left lateral ventricle. No acute intracranial hemorrhage identified. Superimposed chronic small vessel disease including patchy bilateral white matter hypodensity and chronic heterogeneous hypodensity in the bilateral deep gray nuclei. Chronic bi thalamic involvement. Stable gray-white matter differentiation throughout the brain. Vascular: Calcified atherosclerosis at the skull base. No suspicious intracranial vascular hyperdensity. Skull: Intact.  No acute osseous abnormality identified. Sinuses/Orbits: Mild paranasal sinus mucosal thickening has not  significantly changed. Tympanic cavities and mastoids are well aerated. Other: Postoperative changes to both globes. Visualized scalp soft tissues are within  normal limits. IMPRESSION: 1. No acute intracranial abnormality. 2. Stable non contrast CT appearance of advanced chronic small vessel disease, chronic left MCA infarct. Electronically Signed   By: Odessa Fleming M.D.   On: 10/01/2023 04:53   DG Chest Port 1 View  Result Date: 10/01/2023 CLINICAL DATA:  Shortness of breath EXAM: PORTABLE CHEST 1 VIEW COMPARISON:  10/10/2021 FINDINGS: Multifocal patchy right upper and lower lobe opacities, suspicious for pneumonia. Left lung is clear. No pleural effusion or pneumothorax. Heart is normal in size. IMPRESSION: Multifocal right lung pneumonia. Electronically Signed   By: Charline Bills M.D.   On: 10/01/2023 02:19     Assessment and Plan:   Acute on Chronic HFrEF  - EF was previously 40-45% in 2016, patient was started on GDMT and EF improved to 65-70% in 2018.  - Now, patient presents with progressive SOB, ankle edema, dry cough. BNP 207 - Echo this admission showed EF 35-40%, mild LVH, grade II DD, normal RV function - On IV lasix 40 mg BID- I/Os not complete. Patient reports improvement in symptoms. Continues to have fine crackles in lung bases. 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 Patient without chest pain. Consider outpatient ischemic evaluation   HTN  - Start carvedilol, losartan as above   Otherwise per primary  - Alcohol use disorder, cocaine use disorder  - Type 2 DM  - Housing instability    Risk Assessment/Risk Scores:   New York Heart Association (NYHA) Functional Class NYHA Class III      For questions or updates, please contact [facility] Please consult [email or web address] for contact info under    Signed, Jonita Albee, PA-C  10/01/2023 3:46 PM

## 2023-10-01 NOTE — ED Notes (Signed)
ED TO INPATIENT HANDOFF REPORT  ED Nurse Name and Phone #: Marchelle Folks RN [phone]  S Name/Age/Gender Brady Church 62 y.o. male Room/Bed: 045C/045C  Code Status   Code Status: Full Code  Home/SNF/Other Home Patient oriented to: self, place, time, and situation Is this baseline? Yes   Triage Complete: Triage complete  Chief Complaint Acute hypoxic respiratory failure (HCC) [J96.01]  Triage Note Pt bibgcems from home with gradual onset of sob. Ems arrival pt working hard to breathe and hypoxic. Sats 85% initial pressure 255/135 placed cpap 2 nitroglycerin sublingual with ems  Hx- hypertension  Bp 155/117   Allergies No Known Allergies  Level of Care/Admitting Diagnosis ED Disposition     ED Disposition  Admit   Condition  --   Comment  Hospital Area: [facility] [[id number]]  Level of Care: Telemetry Medical [104]  May place patient in observation at [facility] or [facility] if equivalent level of care is available:: No  Covid Evaluation: Confirmed COVID Negative  Diagnosis: Acute hypoxic respiratory failure [facility]) [[id number]]  Admitting Physician: [name] [phone]  Attending Physician: [name] [phone]          B Medical/Surgery History Past Medical History:  Diagnosis Date   Alcohol use disorder, severe, dependence (HCC) 11/06/2014   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 Stopped in 2010   Tobacco use disorder 10/07/2014   Past Surgical History:  Procedure Laterality Date   CATARACT EXTRACTION  01/08/2012   Right eye   EYE SURGERY     x 4 total      A IV Location/Drains/Wounds Patient Lines/Drains/Airways Status     Active Line/Drains/Airways     Name Placement date Placement time Site  Days   Peripheral IV 07/27/23 20 G Left;Posterior Hand 07/27/23  --  Hand  66            Intake/Output Last 24 hours No intake or output data in the 24 hours ending 10/01/23 0854  Labs/Imaging Results for orders placed or performed during the hospital encounter of 10/01/23 (from the past 48 hour(s))  CBG monitoring, ED     Status: Abnormal   Collection Time: 10/01/23  1:53 AM  Result Value Ref Range   Glucose-Capillary 202 (H) 70 - 99 mg/dL    Comment: Glucose reference range applies only to samples taken after fasting for at least 8 hours.  Brain natriuretic peptide     Status: Abnormal   Collection Time: 10/01/23  1:55 AM  Result Value Ref Range   B Natriuretic Peptide 207.8 (H) 0.0 - 100.0 pg/mL    Comment: Performed at [facility], [address], [place], [place] [address]  Ethanol     Status: None   Collection Time: 10/01/23  1:55 AM  Result Value Ref Range   Alcohol, Ethyl (B) <10 <10 mg/dL    Comment: (NOTE) Lowest detectable limit for serum alcohol is 10 mg/dL.  For medical purposes only. Performed at [facility], [address], [place], [place] [address]   Comprehensive metabolic panel     Status: Abnormal   Collection Time: 10/01/23  1:58 AM  Result Value Ref Range   Sodium 136  135 - 145 mmol/L   Potassium 4.2 3.5 - 5.1 mmol/L    Comment: HEMOLYSIS AT THIS LEVEL MAY AFFECT RESULT   Chloride 101 98 - 111 mmol/L   CO2 22 22 - 32 mmol/L   Glucose, Bld 198 (H) 70 - 99 mg/dL    Comment: Glucose reference range applies only to samples taken after fasting for at least 8 hours.   BUN 15 6 - 20 mg/dL   Creatinine, Ser 0.35 0.61 - 1.24 mg/dL   Calcium 8.7 (L) 8.9 - 10.3 mg/dL   Total Protein 6.8 6.5 - 8.1 g/dL   Albumin 3.4 (L) 3.5 - 5.0 g/dL   AST 28 15 - 41 U/L    Comment: HEMOLYSIS AT THIS LEVEL MAY AFFECT RESULT   ALT 30 0 - 44 U/L    Comment: HEMOLYSIS AT THIS LEVEL MAY AFFECT RESULT   Alkaline Phosphatase 62 38 - 126 U/L   Total Bilirubin  0.7 <1.2 mg/dL    Comment: HEMOLYSIS AT THIS LEVEL MAY AFFECT RESULT   GFR, Estimated >60 >60 mL/min    Comment: (NOTE) Calculated using the CKD-EPI Creatinine Equation (2021)    Anion gap 13 5 - 15    Comment: Performed at [facility], [address], [place], [place] [address]  Troponin I (High Sensitivity)     Status: Abnormal   Collection Time: 10/01/23  1:58 AM  Result Value Ref Range   Troponin I (High Sensitivity) 33 (H) <18 ng/L    Comment: (NOTE) Elevated high sensitivity troponin I (hsTnI) values and significant  changes across serial measurements may suggest ACS but many other  chronic and acute conditions are known to elevate hsTnI results.  Refer to the "Links" section for chest pain algorithms and additional  guidance. Performed at [facility], [address], [place], [place] [address]   CBC with Differential     Status: None   Collection Time: 10/01/23  1:58 AM  Result Value Ref Range   WBC 6.5 4.0 - 10.5 K/uL   RBC 4.45 4.22 - 5.81 MIL/uL   Hemoglobin 14.5 13.0 - 17.0 g/dL   HCT 37.1 69.6 - 78.9 %   MCV 94.4 80.0 - 100.0 fL   MCH 32.6 26.0 - 34.0 pg   MCHC 34.5 30.0 - 36.0 g/dL   RDW 38.1 01.7 - 51.0 %   Platelets 351 150 - 400 K/uL   nRBC 0.0 0.0 - 0.2 %   Neutrophils Relative % 41 %   Neutro Abs 2.6 1.7 - 7.7 K/uL   Lymphocytes Relative 46 %   Lymphs Abs 3.0 0.7 - 4.0 K/uL   Monocytes Relative 8 %   Monocytes Absolute 0.5 0.1 - 1.0 K/uL   Eosinophils Relative 4 %   Eosinophils Absolute 0.3 0.0 - 0.5 K/uL   Basophils Relative 1 %   Basophils Absolute 0.1 0.0 - 0.1 K/uL   Immature Granulocytes 0 %   Abs Immature Granulocytes 0.02 0.00 - 0.07 K/uL    Comment: Performed at [facility], [address], [place], [place] [address]  I-Stat CG4 Lactic Acid, ED     Status: None   Collection Time: 10/01/23  2:22 AM  Result Value Ref Range   Lactic Acid, Venous 1.1 0.5 - 1.9 mmol/L  I-Stat arterial blood gas, ED     Status: Abnormal    Collection Time: 10/01/23  2:26 AM  Result Value Ref Range   pH, Arterial 7.376 7.35 -  7.45   pCO2 arterial 44.5 32 - 48 mmHg   pO2, Arterial 276 (H) 83 - 108 mmHg   Bicarbonate 26.0 20.0 - 28.0 mmol/L   TCO2 27 22 - 32 mmol/L   O2 Saturation 100 %   Acid-Base Excess 0.0 0.0 - 2.0 mmol/L   Sodium 137 135 - 145 mmol/L   Potassium 3.4 (L) 3.5 - 5.1 mmol/L   Calcium, Ion 1.22 1.15 - 1.40 mmol/L   HCT 39.0 39.0 - 52.0 %   Hemoglobin 13.3 13.0 - 17.0 g/dL   Collection site RADIAL, ALLEN'S TEST ACCEPTABLE    Drawn by Operator    Sample type ARTERIAL   Troponin I (High Sensitivity)     Status: Abnormal   Collection Time: 10/01/23  3:58 AM  Result Value Ref Range   Troponin I (High Sensitivity) 35 (H) <18 ng/L    Comment: (NOTE) Elevated high sensitivity troponin I (hsTnI) values and significant  changes across serial measurements may suggest ACS but many other  chronic and acute conditions are known to elevate hsTnI results.  Refer to the "Links" section for chest pain algorithms and additional  guidance. Performed at [facility], [address] [place], [place] [address]   SARS Coronavirus 2 by RT PCR (hospital order, performed in [facility] hospital lab) *cepheid single result test* Anterior Nasal Swab     Status: None   Collection Time: 10/01/23  4:02 AM   Specimen: Anterior Nasal Swab  Result Value Ref Range   SARS Coronavirus 2 by RT PCR NEGATIVE NEGATIVE    Comment: Performed at [facility], [address], [place], [place] [address]  Procalcitonin     Status: None   Collection Time: 10/01/23  6:19 AM  Result Value Ref Range   Procalcitonin <0.10 ng/mL    Comment:        Interpretation: PCT (Procalcitonin) <= 0.5 ng/mL: Systemic infection (sepsis) is not likely. Local bacterial infection is possible. (NOTE)       Sepsis PCT Algorithm           Lower Respiratory Tract                                      Infection PCT Algorithm     ----------------------------     ----------------------------         PCT < 0.25 ng/mL                PCT < 0.10 ng/mL          Strongly encourage             Strongly discourage   discontinuation of antibiotics    initiation of antibiotics    ----------------------------     -----------------------------       PCT 0.25 - 0.50 ng/mL            PCT 0.10 - 0.25 ng/mL               OR       >80% decrease in PCT            Discourage initiation of                                            antibiotics  Encourage discontinuation           of antibiotics    ----------------------------     -----------------------------         PCT >= 0.50 ng/mL              PCT 0.26 - 0.50 ng/mL               AND        <80% decrease in PCT             Encourage initiation of                                             antibiotics       Encourage continuation           of antibiotics    ----------------------------     -----------------------------        PCT >= 0.50 ng/mL                  PCT > 0.50 ng/mL               AND         increase in PCT                  Strongly encourage                                      initiation of antibiotics    Strongly encourage escalation           of antibiotics                                     -----------------------------                                           PCT <= 0.25 ng/mL                                                 OR                                        > 80% decrease in PCT                                      Discontinue / Do not initiate                                             antibiotics  Performed at [facility], [address] [place], [place] [address]    CT Head Wo Contrast  Result Date: 10/01/2023 CLINICAL DATA:  62 year old male with altered mental status.  Shortness of breath, hypoxia. Hypertensive, 255 systolic. EXAM: CT HEAD WITHOUT CONTRAST TECHNIQUE: Contiguous axial images were obtained from the base of the  skull through the vertex without intravenous contrast. RADIATION DOSE REDUCTION: This exam was performed according to the departmental dose-optimization program which includes automated exposure control, adjustment of the mA and/or kV according to patient size and/or use of iterative reconstruction technique. COMPARISON:  Brain MRI 02/16/2017.  Head CT 10/10/2021. FINDINGS: Brain: Chronic encephalomalacia posterior left MCA territory. Cerebral volume stable since 2022. No midline shift, mass effect, or evidence of intracranial mass lesion. Mild ex vacuo enlargement of the left lateral ventricle. No acute intracranial hemorrhage identified. Superimposed chronic small vessel disease including patchy bilateral white matter hypodensity and chronic heterogeneous hypodensity in the bilateral deep gray nuclei. Chronic bi thalamic involvement. Stable gray-white matter differentiation throughout the brain. Vascular: Calcified atherosclerosis at the skull base. No suspicious intracranial vascular hyperdensity. Skull: Intact.  No acute osseous abnormality identified. Sinuses/Orbits: Mild paranasal sinus mucosal thickening has not significantly changed. Tympanic cavities and mastoids are well aerated. Other: Postoperative changes to both globes. Visualized scalp soft tissues are within normal limits. IMPRESSION: 1. No acute intracranial abnormality. 2. Stable non contrast CT appearance of advanced chronic small vessel disease, chronic left MCA infarct. Electronically Signed   By: Odessa Fleming M.D.   On: 10/01/2023 04:53   DG Chest Port 1 View  Result Date: 10/01/2023 CLINICAL DATA:  Shortness of breath EXAM: PORTABLE CHEST 1 VIEW COMPARISON:  10/10/2021 FINDINGS: Multifocal patchy right upper and lower lobe opacities, suspicious for pneumonia. Left lung is clear. No pleural effusion or pneumothorax. Heart is normal in size. IMPRESSION: Multifocal right lung pneumonia. Electronically Signed   By: Charline Bills M.D.   On:  10/01/2023 02:19    Pending Labs Unresulted Labs (From admission, onward)     Start     Ordered   10/01/23 0602  Respiratory (~20 pathogens) panel by PCR  (Respiratory panel by PCR (~20 pathogens, ~24 hr TAT)  w precautions)  Once,   R        10/01/23 0603   10/01/23 0559  HIV Antibody (routine testing w rflx)  (HIV Antibody (Routine testing w reflex) panel)  Once,   R        10/01/23 0603            Vitals/Pain Today's Vitals   10/01/23 0444 10/01/23 0500 10/01/23 0645 10/01/23 0755  BP:  (!) 161/127 (!) 135/98   Pulse: 71 81 64 70  Resp: (!) 21 (!) 21 11 (!) 21  Temp:      TempSrc:      SpO2: 98% 97% 95% 92%  Weight:      Height:        Isolation Precautions Droplet precaution  Medications Medications  rivaroxaban (XARELTO) tablet 10 mg (has no administration in time range)  thiamine (VITAMIN B1) tablet 100 mg (has no administration in time range)    Or  thiamine (VITAMIN B1) injection 100 mg (has no administration in time range)  folic acid (FOLVITE) tablet 1 mg (has no administration in time range)  multivitamin with minerals tablet 1 tablet (has no administration in time range)  aspirin EC tablet 81 mg (has no administration in time range)  atorvastatin (LIPITOR) tablet 80 mg (has no administration in time range)  brimonidine (ALPHAGAN) 0.15 % ophthalmic solution 1 drop (has no administration in time range)  fluorometholone (FML) 0.1 % ophthalmic suspension 1 drop (has no administration in  time range)  Netarsudil-Latanoprost 0.02-0.005 % SOLN 1 drop (has no administration in time range)  cefTRIAXone (ROCEPHIN) 1 g in sodium chloride 0.9 % 100 mL IVPB (0 g Intravenous Stopped 10/01/23 0325)  azithromycin (ZITHROMAX) tablet 500 mg (500 mg Oral Given 10/01/23 0356)  furosemide (LASIX) injection 40 mg (40 mg Intravenous Given 10/01/23 0618)    Mobility walks with device     Focused Assessments Cardiac Assessment Handoff:    Lab Results  Component Value Date    CKTOTAL 268 (H) 05/12/2012   CKMB 3.3 05/11/2012   TROPONINI 0.16 (HH) 02/17/2017   No results found for: "DDIMER" Does the Patient currently have chest pain? No   , Pulmonary Assessment Handoff:  Lung sounds: Bilateral Breath Sounds: Diminished L Breath Sounds: Diminished R Breath Sounds: Diminished O2 Device: Room Air      R Recommendations: See Admitting Provider Note  Report given to:   Additional Notes: pt is blind pt is currently  maintaining oxygen saturation of 89% on 2l [place] . Md aware and states this is within goals

## 2023-10-01 NOTE — Evaluation (Signed)
Occupational Therapy Evaluation Patient Details Name: Brady Church MRN: [id number] DOB: 23-Jan-1961 Today's Date: 10/01/2023   History of Present Illness 62 yo male with a PMH of polysubstance use (alcohol use disorder, cocaine use disorder), type 2 diabetes, hyperlipidemia, hypertension, glaucoma, left MCA territory infarct, and legal blindness who presents with shortness of breath and cough.   Clinical Impression   Patient sleeping soundly, but willing to move in the room and sit up for lunch.  PTA the patient is homeless, utilizing local shelters and resources for food.  He uses a walking stick due to blindness, and public transportation for medical appointments.  Currently he is at or near his baseline, needing generalized supervision for ADL completion and in room mobility due to legally blind and new environment.  Patient is not complaining of SOB, and subjectively states he feels better.  He is hoping to find an apartment prior to discharge.  PT consult is pending and mobility team has been referred.  No post acute OT is anticipated.        If plan is discharge home, recommend the following: Assist for transportation;Direct supervision/assist for medications management    Functional Status Assessment  Patient has had a recent decline in their functional status and demonstrates the ability to make significant improvements in function in a reasonable and predictable amount of time.  Equipment Recommendations  None recommended by OT    Recommendations for Other Services       Precautions / Restrictions Precautions Precautions: Fall Precaution Comments: Walking stick in room Restrictions Weight Bearing Restrictions: No      Mobility Bed Mobility Overal bed mobility: Modified Independent                  Transfers Overall transfer level: Needs assistance   Transfers: Sit to/from Stand, Bed to chair/wheelchair/BSC Sit to Stand: Modified independent (Device/Increase  time)     Step pivot transfers: Modified independent (Device/Increase time), Supervision            Balance Overall balance assessment: Needs assistance Sitting-balance support: Feet supported Sitting balance-Leahy Scale: Good     Standing balance support: Single extremity supported Standing balance-Leahy Scale: Fair                             ADL either performed or assessed with clinical judgement   ADL Overall ADL's : At baseline                                       General ADL Comments: appears very close to baseline.  Able to don socks, place mesh underware and walk to toilet with generalized supervision.     Vision Baseline Vision/History: 2 Legally blind Ability to See in Adequate Light: 2 Moderately impaired Patient Visual Report: No change from baseline Additional Comments: can tell light from dark, makes out shapes and can make out color.     Perception Perception: Within Functional Limits       Praxis Praxis: WFL       Pertinent Vitals/Pain Pain Assessment Pain Assessment: No/denies pain     Extremity/Trunk Assessment Upper Extremity Assessment Upper Extremity Assessment: Right hand dominant;RUE deficits/detail RUE Deficits / Details: Mild weakness, sensory deficits from a prior stroke RUE Sensation: decreased light touch RUE Coordination: decreased fine motor   Lower Extremity Assessment Lower Extremity Assessment: Defer to PT  evaluation   Cervical / Trunk Assessment Cervical / Trunk Assessment: Kyphotic   Communication Communication Communication: Other (comment) (Difficult to understand, mumbles.)   Cognition Arousal: Lethargic Behavior During Therapy: WFL for tasks assessed/performed Overall Cognitive Status: Within Functional Limits for tasks assessed                                       General Comments   VSS on RA    Exercises     Shoulder Instructions      Home Living  Family/patient expects to be discharged to:: Shelter/Homeless                                        Prior Functioning/Environment Prior Level of Function : Independent/Modified Independent             Mobility Comments: Has a walking stick ADLs Comments: Ind with ADL, takes public transportation to medical appointments, relies on shelter and agencies for food        OT Problem List: Impaired balance (sitting and/or standing)      OT Treatment/Interventions:      OT Goals(Current goals can be found in the care plan section) Acute Rehab OT Goals Patient Stated Goal: Get into an apartment OT Goal Formulation: With patient Time For Goal Achievement: 10/12/23 Potential to Achieve Goals: Fair  OT Frequency:      Co-evaluation              AM-PAC OT "6 Clicks" Daily Activity     Outcome Measure Help from another person eating meals?: None Help from another person taking care of personal grooming?: None Help from another person toileting, which includes using toliet, bedpan, or urinal?: A Little Help from another person bathing (including washing, rinsing, drying)?: A Little Help from another person to put on and taking off regular upper body clothing?: None Help from another person to put on and taking off regular lower body clothing?: A Little 6 Click Score: 21   End of Session Equipment Utilized During Treatment: Other (comment) (walking stick) Nurse Communication: Mobility status  Activity Tolerance: Patient tolerated treatment well Patient left: in chair;with call bell/phone within reach;with chair alarm set  OT Visit Diagnosis: Unsteadiness on feet (R26.81)                Time: 4098-1191 OT Time Calculation (min): 19 min Charges:  OT General Charges $OT Visit: 1 Visit OT Evaluation $OT Eval Moderate Complexity: 1 Mod  10/01/2023  RP, OTR/L  Acute Rehabilitation Services  Office:  [phone]   Suzanna Obey 10/01/2023, 12:11  PM

## 2023-10-01 NOTE — Progress Notes (Signed)
Pt taken off BIPAP, on RA sat 100%, RR 17, NAD

## 2023-10-01 NOTE — Progress Notes (Addendum)
HD#0 SUBJECTIVE:  Patient Summary: Brady Church is a 62 y.o. with a pertinent PMH of chronic alcohol use, cocaine use , HTN ,HLD, and T2DM and legal blindness , who presented with SOB and cough and admitted for Acute hypoxic respiratory failure .   Overnight Events: Admitted   Interim History: Patient was seen at bed side with BiPAP on. Reports shortness of breath and feeling un well. His Bipap was weaned off to 2L [place] and subsequently weaned off to RA with good saturations.   OBJECTIVE:  Vital Signs: Vitals:   10/01/23 0847 10/01/23 0915 10/01/23 0923 10/01/23 1010  BP:  (!) 144/101  (!) 147/109  Pulse: 61 70  68  Resp: 13 11  14   Temp:    97.8 F (36.6 C)  TempSrc:    Oral  SpO2: 94% 94% 90% 100%  Weight:      Height:        Filed Weights   10/01/23 0159  Weight: 97.5 kg    No intake or output data in the 24 hours ending 10/01/23 1220 Net IO Since Admission: No IO data has been entered for this period [10/01/23 1220]  Physical Exam: Physical Exam Constitutional:      Comments: Mild acute distress   Cardiovascular:     Rate and Rhythm: Normal rate and regular rhythm.  Pulmonary:     Comments: Crackles noted at right lower lobes  Abdominal:     Palpations: There is no mass.     Tenderness: There is no abdominal tenderness. There is no guarding or rebound.  Musculoskeletal:     Right lower leg: No tenderness. No edema.     Left lower leg: No tenderness. No edema.     Comments: No pitting edema appreciated on exam  Neurological:     Mental Status: He is alert and oriented to person, place, and time.  Psychiatric:        Mood and Affect: Mood normal.     Patient Lines/Drains/Airways Status     Active Line/Drains/Airways     Name Placement date Placement time Site Days   Peripheral IV 07/27/23 20 G Left;Posterior Hand 07/27/23  --  Hand  66             ASSESSMENT/PLAN:  Assessment: Principal Problem:   Acute hypoxic respiratory failure  (HCC)   Plan: #Acute on chronic hypoxic failure  Patient presented with a SOB, sating in the low 80 s, started on BiPAP but weaned down to RA with good saturation during my encounter this morning. His EKG and labs are reassuring in terms of ACS although trops mildly elevated ,he denied any chest pain. Will follow up on troponin until it peaks.BMP slightly elevated. Pro-calcitonin < 0.10.Pt is afebrile and has no leucocytosis.Lactic acid WLN. CXR however showed multifocal right lung pneumonia.He received one dose of ceftriaxone and Zithromax in the ED. I have low threshold for ongoing active infection at this time.Will hold antibiotics . Patient had an echo done in March 2018 that showed a left ventricular ejection fraction of 65 to 70% with no wall abnormalities at that time. Ischemic eval also didn't show any ischemia or scar.New ECHO done on 10/01/2023 showed Left ventricular ejection fraction, by estimation, is 35 to 40%. The left ventricle has moderately decreased function. The left ventricle demonstrates global hypokinesis. There is mild concentric left ventricular hypertrophy. Left ventricular diastolic parameters are consistent with Grade II diastolic dysfunction. This new  reduction in in LVEF could be  in the setting of cocaine and alcohol use. Cardiology is following. - Will continue IV Lasix  - Start Carvedilol and Losartan per Cardiology  - Monitor strict in/out - BMP - Follow up RVP  - Cardiology recs appreciated   #Alcohol use disorder #Cocaine use disorder  - CIWA protocol  - Continue substance abuse counseling   #Hx of Hypertension  Patient has a history of HTN but on no home regimen. He did get nitro prior to presentation  when his BP was 161/127. BP now at 147/109. Will continue to monitor   Chronic conditions  1. Type 2 diabetes -on home empagliflozin-metformin 5/500 mg twice daily at home.  Resume when able 2. Hyperlipidemia -continue home atorvastatin 80 mg daily and baby  aspirin daily. 3. Glaucoma - continue on regimen 4. Homelessness- Follow-up with TOC for available resources regarding homelessness  Chronic conditions: T2DM- Glucose 202 on presentation. Recent A1c 7.8 in October. On empagliflozin-metformin 5-500 mg BID at home, held.  HLD- Atorvastatin 80 mg daily and aspirin 81 mg daily, continued.  Glaucoma- Continued brimonidine, fluorometholone, and netarsudil-latanoprost eye drops Housing instability- TOC consult for resources and help securing housing.  Best Practice: Diet: Cardiac diet IVF: IV Laxis , Rate: None VTE: rivaroxaban (XARELTO) tablet 10 mg Start: 10/01/23 1000 Code: Full AB: Received one dose of ceftriaxone and Azithromycin DISPO: Anticipated discharge tomorrow pending  Medical work up and treatment  .  Signature: Kathleen Lime , MD Internal Medicine Resident, PGY-1 [facility]  Pager: [phone] 12:20 PM, 10/01/2023   Please contact the on call pager after 5 pm and on weekends at [phone].

## 2023-10-01 NOTE — Plan of Care (Signed)

## 2023-10-01 NOTE — H&P (Addendum)
Date: 10/01/2023               Patient Name:  Brady Church MRN: [id number]  DOB: September 25, 1961 Age / Sex: 62 y.o., male   PCP: [name], MD         Medical Service: Internal Medicine Teaching Service         Attending Physician: Dr. [name], MD      First Contact: Dr. Manuela Neptune, MD Pager [phone]    Second Contact: Dr. Morene Crocker, MD Pager [phone]         After Hours (After 5p/  First Contact Pager: [phone]  weekends / holidays): Second Contact Pager: [phone]   SUBJECTIVE   Chief Complaint: shortness of breath  History of Present Illness: Mr. Brady Church is a 62 yo male with a PMH of polysubstance use (alcohol use disorder, cocaine use disorder), type 2 diabetes, hyperlipidemia, hypertension, glaucoma and legal blindness who presents with shortness of breath and cough for the past 2-3 days.  History at this time was limited by BiPAP use.  He says his shortness of breath and cough began 2 or 3 days ago and has been escalating since.  He notes that he increasingly has difficulty breathing, especially when he walks.  He denies any recent illnesses or sick contacts.  He does state that he has had a nonproductive cough recently.  He has not noticed any increased swelling.  He denies any chest pain, headaches.  Of note, he states he recently used cocaine on Saturday or Sunday and his symptoms began following that use. He also notes that "it's been a minute" since he has taken any of his medications. He denies any other symptoms including nausea, vomiting, fever, chills, or diarrhea.  ED Course: Transported by EMS.  Hypoxic with oxygen in the 80s. No hypercapnia. Blood pressure significantly elevated as high as 250/130.  Placed on CPAP, given 2 tabs sublingual nitroglycerin.  Symptoms improved. CXR with R>L opacities. Given rocephin and azithromycin. CT head unremarkable.  Meds: Pt states he has not taken them in a while  Ammonium lactate  12% lotion 1 application PRN dry skin ASA 81 mg daily Atorvastatin 80 mg daily Brimonidone 0.15% ophthalmic soluction 1 drop both eyes BID Dorzolamide-timolol 22.3-6.8 mg/mL 1 drop R eye BID Empagliflozin-metformin 5-500 mg BID Fluorometholone 0.1% ophthalmic suspension 1 drop both eyes daily Rocklatan 0.02-0.005% solution 1 drop R eye at bdtime Sildenafil 20 mg 2-5 tablets PRN  Current Meds  Medication Sig   aspirin EC 81 MG tablet Take 1 tablet (81 mg total) by mouth daily. Swallow whole.   atorvastatin (LIPITOR) 80 MG tablet Take 1 tablet (80 mg total) by mouth daily at 6 PM.   Empagliflozin-metFORMIN HCl 5-500 MG TABS Take 1 tablet by mouth 2 (two) times daily.   LUMIGAN 0.01 % SOLN Place 1 drop into the right eye at bedtime.   RHOPRESSA 0.02 % SOLN Apply 1 drop to eye at bedtime. Use in the evening   ROCKLATAN 0.02-0.005 % SOLN Place 1 drop into the right eye at bedtime.   SIMBRINZA 1-0.2 % SUSP Place 1 drop into the right eye 2 (two) times daily.   timolol (TIMOPTIC) 0.5 % ophthalmic solution Place 1 drop into the right eye 2 (two) times daily.   Past Medical History: Polysubtance abuse Legally blind T2DM HLD HTN  Past Surgical History:  Procedure Laterality Date   CATARACT EXTRACTION  01/08/2012   Right eye   EYE SURGERY  x 4 total    Social:  Lives: unhoused, sleeps outside [employer] Level of Function: independent in ADLs, iADLs PCP: Dr. [name] Substances: Drinks alcohol, in last few days he has had two 40 oz beers/day, no liquor. Last drink was last night. Uses cocaine regularly with last use on Saturday or Sunday. States he didn't use a lot. Smokes marijuana. No tobacco use.   Family History:  Family History  Problem Relation Age of Onset   Coronary artery disease Mother 51       died of MI   Cancer Father 51       Some GI cancer. Not sure if it was Colon Cancer or not.   Hypertension Father    Diabetes Maternal Grandmother      Allergies: Allergies as of 10/01/2023   (No Known Allergies)   Review of Systems: A complete ROS was negative except as per HPI.   OBJECTIVE:   Physical Exam: Blood pressure (!) 161/127, pulse 81, temperature 97.8 F (36.6 C), temperature source Oral, resp. rate (!) 21, height 5\' 9"  (1.753 m), weight 97.5 kg, SpO2 97%.  Constitutional: laying in bed, on Bipap, eyes closed, in NAD HENT: normocephalic atraumatic, mucous membranes moist Eyes: conjunctiva non-erythematous Neck: supple Cardiovascular: regular rate and rhythm, no m/r/g; no LE edema Pulmonary/Chest: Diffuse crackles present on the right greater than the left; normal inspiratory effort, normal rate of breathing, no accessory muscle use Abdominal: soft, mildly distended, mild suprapubic tenderness, normal bowel sounds MSK: normal bulk and tone Neurological: alert & oriented x 3, no focal neuro deficits appreciated Skin: warm and dry Psych: normal mood and affect  Labs: CBC    Component Value Date/Time   WBC 6.5 10/01/2023 0158   RBC 4.45 10/01/2023 0158   HGB 13.3 10/01/2023 0226   HGB 13.6 08/06/2019 1036   HCT 39.0 10/01/2023 0226   HCT 38.1 08/06/2019 1036   PLT 351 10/01/2023 0158   PLT 227 08/06/2019 1036   MCV 94.4 10/01/2023 0158   MCV 94 08/06/2019 1036   MCH 32.6 10/01/2023 0158   MCHC 34.5 10/01/2023 0158   RDW 14.2 10/01/2023 0158   RDW 14.1 08/06/2019 1036   LYMPHSABS 3.0 10/01/2023 0158   MONOABS 0.5 10/01/2023 0158   EOSABS 0.3 10/01/2023 0158   BASOSABS 0.1 10/01/2023 0158     CMP     Component Value Date/Time   NA 137 10/01/2023 0226   NA 139 02/21/2023 1129   K 3.4 (L) 10/01/2023 0226   CL 101 10/01/2023 0158   CO2 22 10/01/2023 0158   GLUCOSE 198 (H) 10/01/2023 0158   BUN 15 10/01/2023 0158   BUN 15 02/21/2023 1129   CREATININE 1.04 10/01/2023 0158   CREATININE 0.93 01/07/2015 1039   CALCIUM 8.7 (L) 10/01/2023 0158   PROT 6.8 10/01/2023 0158   PROT 7.3 07/05/2022 1034    ALBUMIN 3.4 (L) 10/01/2023 0158   ALBUMIN 4.4 07/05/2022 1034   AST 28 10/01/2023 0158   ALT 30 10/01/2023 0158   ALKPHOS 62 10/01/2023 0158   BILITOT 0.7 10/01/2023 0158   BILITOT 0.3 07/05/2022 1034   GFRNONAA >60 10/01/2023 0158   GFRNONAA >89 01/07/2015 1039   GFRAA 102 02/11/2020 1023   GFRAA >89 01/07/2015 1039   COVID: negative BNP: 207 Troponin: 35 Lactic: 1.1 Ethanol: <10  Imaging: CT Head Wo Contrast CLINICAL DATA:  62 year old male with altered mental status. Shortness of breath, hypoxia. Hypertensive, 255 systolic.  EXAM: CT HEAD WITHOUT CONTRAST  TECHNIQUE: Contiguous axial images were obtained from the base of the skull through the vertex without intravenous contrast.  RADIATION DOSE REDUCTION: This exam was performed according to the departmental dose-optimization program which includes automated exposure control, adjustment of the mA and/or kV according to patient size and/or use of iterative reconstruction technique.  COMPARISON:  Brain MRI 02/16/2017.  Head CT 10/10/2021.  FINDINGS: Brain: Chronic encephalomalacia posterior left MCA territory. Cerebral volume stable since 2022.  No midline shift, mass effect, or evidence of intracranial mass lesion. Mild ex vacuo enlargement of the left lateral ventricle. No acute intracranial hemorrhage identified.  Superimposed chronic small vessel disease including patchy bilateral white matter hypodensity and chronic heterogeneous hypodensity in the bilateral deep gray nuclei. Chronic bi thalamic involvement. Stable gray-white matter differentiation throughout the brain.  Vascular: Calcified atherosclerosis at the skull base. No suspicious intracranial vascular hyperdensity.  Skull: Intact.  No acute osseous abnormality identified.  Sinuses/Orbits: Mild paranasal sinus mucosal thickening has not significantly changed. Tympanic cavities and mastoids are well aerated.  Other: Postoperative changes to both  globes. Visualized scalp soft tissues are within normal limits.  IMPRESSION: 1. No acute intracranial abnormality. 2. Stable non contrast CT appearance of advanced chronic small vessel disease, chronic left MCA infarct.  Electronically Signed   By: Odessa Fleming M.D.   On: 10/01/2023 04:53 DG Chest Port 1 View CLINICAL DATA:  Shortness of breath  EXAM: PORTABLE CHEST 1 VIEW  COMPARISON:  10/10/2021  FINDINGS: Multifocal patchy right upper and lower lobe opacities, suspicious for pneumonia. Left lung is clear. No pleural effusion or pneumothorax.  Heart is normal in size.  IMPRESSION: Multifocal right lung pneumonia.  Electronically Signed   By: Charline Bills M.D.   On: 10/01/2023 02:19  EKG: personally reviewed my interpretation is sinus rhythm, no ischemic changes appreciated  ASSESSMENT & PLAN:   Assessment & Plan by Problem: Principal Problem:   Acute hypoxic respiratory failure (HCC)  Braxon Suder is a 62 y.o. person living with a history of polysubstance use (alcohol, cocaine), HTN, HLD, T2DM, and legal blindness who presented with shortness of breath and cough and is admitted for Acute hypoxic respiratory failure on hospital day 0  Acute hypoxic respiratory failure Patient satting in the 80s on arrival, on BiPAP and satting well with no hypercapnia or neurological symptoms appreciated.  Symptoms began following recent cocaine use on Saturday or Sunday.  Asymmetric pulmonary opacities seen on chest x-ray, differential includes pneumonia vs acute pulmonary toxicity in the setting of cocaine use vs cardiogenic pulmonary edema. Received 1 dose of rocephin and azithromycin in the ED. WBC wnl, afebrile, COVID negative. Troponin 33, EKG with no ischemic changes, low concern for ACS. BNP slightly elevated at 207, no recent echo. The asymmetry of the opacities on the CXR is concerning for multilobar pneumonia, but the chronology of the symptoms suggests possible  cocaine-associated acute pulmonary toxicity.   Plan: -Procalcitonin; consider continuing antibiotics pending result -Respiratory viral panel, HIV antibody screen -Bipap, wean as tolerated -IV Lasix 40 mg  -Echocardiogram -Strict I's & O's -Cardiac monitoring -PT/OT eval  Alcohol use disorder Has a long history of daily alcohol use. Last drink was yesterday evening. No evidence of current withdrawal symptoms on exam.   Plan: -CIWA protocol -Thiamine, folate, multivitamin -Continue cessation counseling, TOC consult for substance use counseling -Consider naltrexone at discharge  Hypertension Blood pressure significantly elevated as high as 250/130 on presentation to EMS. Down to 150s/100s at the time of our exam. Not taking  any blood pressure medications at home. BP at recent clinic visit in October was 115/64, well controlled off medications. Will continue to monitor.   Chronic conditions: T2DM- Glucose 202 on presentation. Recent A1c 7.8 in October. On empagliflozin-metformin 5-500 mg BID at home, held.  HLD- Atorvastatin 80 mg daily and aspirin 81 mg daily, continued.  Glaucoma- Continued brimonidine, fluorometholone, and netarsudil-latanoprost eye drops Housing instability- TOC consult for resources and help securing housing.  Diet: NPO while on Bipap VTE: DOAC IVF: None Code: Full  Prior to Admission Living Arrangement: Homeless Anticipated Discharge Location: TBD Barriers to Discharge: medical workup and resolution of dyspnea  Dispo: Admit patient to Inpatient with expected length of stay greater than 2 midnights.  Signed: Annett Fabian, MD Internal Medicine Resident, PGY-1 [facility]  Pager: [phone]  10/01/2023, 6:42 AM

## 2023-10-01 NOTE — ED Triage Notes (Signed)
Pt bibgcems from home with gradual onset of sob. Ems arrival pt working hard to breathe and hypoxic. Sats 85% initial pressure 255/135 placed cpap 2 nitroglycerin sublingual with ems  Hx- hypertension  Bp 155/117

## 2023-10-01 NOTE — Progress Notes (Signed)
Pt placed on BIPAP for increased WOB.

## 2023-10-01 NOTE — Progress Notes (Signed)
Mobility Specialist Progress Note:    10/01/23 1450  Mobility  Activity Ambulated with assistance in hallway;Ambulated with assistance to bathroom  Level of Assistance Contact guard assist, steadying assist  Assistive Device Other (Comment) (Walking stick)  Distance Ambulated (ft) 300 ft  Activity Response Tolerated well  Mobility Referral Yes  $Mobility charge 1 Mobility  Mobility Specialist Start Time (ACUTE ONLY) 1435  Mobility Specialist Stop Time (ACUTE ONLY) 1445  Mobility Specialist Time Calculation (min) (ACUTE ONLY) 10 min   Pt received from bathroom, agreeable to mobility session. Tolerated well, asx throughout. SpO2 96% on RA during session. Pt required walking stick and CGA for navigation. Returned pt to room, sitting up in bed, all needs met.    Feliciana Rossetti Mobility Specialist Please contact via Special educational needs teacher or  Rehab office at [phone]

## 2023-10-01 NOTE — Evaluation (Signed)
Physical Therapy Brief Evaluation and Discharge Note Patient Details Name: Brady Church MRN: [id number] DOB: 1960-12-04 Today's Date: 10/01/2023   History of Present Illness  62 yo male with a PMH of polysubstance use (alcohol use disorder, cocaine use disorder), type 2 diabetes, hyperlipidemia, hypertension, glaucoma, left MCA territory infarct, and legal blindness who presents with shortness of breath and cough.  Clinical Impression  Pt doing well with mobility and no further PT needed.  Ready for dc from PT standpoint. Pt blind and uses blind cane. Steady gait.        PT Assessment Patient does not need any further PT services  Assistance Needed at Discharge  PRN    Equipment Recommendations None recommended by PT  Recommendations for Other Services       Precautions/Restrictions Precautions Precautions: Other (comment) Precaution Comments: blind Restrictions Weight Bearing Restrictions: No        Mobility  Bed Mobility Rolling: Independent Supine/Sidelying to sit: Independent Sit to supine/sidelying: Independent    Transfers Overall transfer level: Modified independent Equipment used: Straight cane Transfers: Sit to/from Stand Sit to Stand: Modified independent (Device/Increase time)                Ambulation/Gait Ambulation/Gait assistance: Supervision Gait Distance (Feet): 300 Feet Assistive device: Straight cane (blind cane) Gait Pattern/deviations: Step-through pattern Gait Speed: Below normal General Gait Details: Steady gait with supervision only due to blindness and unfamiliar environment  Home Activity Instructions    Stairs            Modified Rankin (Stroke Patients Only)        Balance Overall balance assessment: Mild deficits observed, not formally tested                        Pertinent Vitals/Pain PT - Brief Vital Signs All Vital Signs Stable: Yes Pain Assessment Pain Assessment: No/denies pain      Home Living Family/patient expects to be discharged to:: Shelter/Homeless                  Prior Function Level of Independence: Independent with assistive device(s) Comments: Uses blind walking stick    UE/LE Assessment   UE ROM/Strength/Tone/Coordination:  (Defer to OT)    LE ROM/Strength/Tone/Coordination: [facility]      Communication   Communication Communication: Other (comment) (mumbles at times)     Cognition Overall Cognitive Status: Appears within functional limits for tasks assessed/performed       General Comments      Exercises     Assessment/Plan    PT Problem List         PT Visit Diagnosis Other abnormalities of gait and mobility (R26.89)    No Skilled PT Patient at baseline level of functioning   Co-evaluation                AMPAC 6 Clicks Help needed turning from your back to your side while in a flat bed without using bedrails?: None Help needed moving from lying on your back to sitting on the side of a flat bed without using bedrails?: None Help needed moving to and from a bed to a chair (including a wheelchair)?: None Help needed standing up from a chair using your arms (e.g., wheelchair or bedside chair)?: None Help needed to walk in hospital room?: None Help needed climbing 3-5 steps with a railing? : A Little 6 Click Score: 23      End of Session  Activity Tolerance: Patient tolerated treatment well Patient left: in bed;with call bell/phone within reach;with bed alarm set   PT Visit Diagnosis: Other abnormalities of gait and mobility (R26.89)     Time: 9604-5409 PT Time Calculation (min) (ACUTE ONLY): 8 min  Charges:   PT Evaluation $PT Eval Low Complexity: 1 Low      [facility] PT Acute Rehabilitation Services Office [phone]   Angelina Ok [facility]  10/01/2023, 4:48 PM

## 2023-10-01 NOTE — ED Notes (Signed)
Provider to keep pt O2 sats 88-92% and place on nasal canula if needed

## 2023-10-01 NOTE — ED Notes (Signed)
RN notified [name] MD that pt bed will need to be upgraded d/t pt on Bi-Pap.   PM RN attempted to trial pt off Bi-Pap which was unsuccessful.

## 2023-10-01 NOTE — Progress Notes (Signed)
Pt placed back on BIPAP after CT d/t desat while patient sleeping

## 2023-10-01 NOTE — ED Notes (Signed)
Per MD Madilyn Hook patient to be taken off of bipap. RT called to bedside and bipap removed 0329

## 2023-10-01 NOTE — Progress Notes (Signed)
MD notified patient is not on telemetry due to no telemetry boxes on our unit.

## 2023-10-01 NOTE — ED Provider Notes (Signed)
[place] EMERGENCY DEPARTMENT AT [facility] Provider Note   CSN: [id number] Arrival date & time: 10/01/23  0146     History  Chief Complaint  Patient presents with   Shortness of Breath    Brady Church is a 62 y.o. male.  The history is provided by the patient, the EMS personnel and medical records.  Shortness of Breath Brady Church is a 62 y.o. male who presents to the Emergency Department complaining of short of breath.  Level 5 caveat due to respiratory distress.  History is provided by EMS and the patient.  He developed gradual onset shortness of breath over the last several hours.  No associated chest pain, fevers.  He does have a nonproductive cough.  EMS reports sats in the 80s, blood pressure 250/130 and he was placed on CPAP and treated with 2 sublingual nitroglycerin.  He does report partial improvement in his symptoms with EMS interventions.  No fevers.  No prior similar symptoms.  He does have a history of hypertension, diabetes.  He is legally blind.  He does use occasional small amounts of cocaine, last use was 2 days ago.  He also drinks alcohol.     Home Medications Prior to Admission medications   Medication Sig Start Date End Date Taking? Authorizing Provider  ammonium lactate (LAC-HYDRIN) 12 % lotion Apply 1 Application topically as needed for dry skin. 10/13/22   [name], DPM  aspirin EC 81 MG tablet Take 1 tablet (81 mg total) by mouth daily. Swallow whole. 08/29/23 08/28/24  [name], MD  atorvastatin (LIPITOR) 80 MG tablet Take 1 tablet (80 mg total) by mouth daily at 6 PM. 10/11/22   [name], MD  brimonidine (ALPHAGAN) 0.15 % ophthalmic solution Place 1 drop into both eyes 2 (two) times daily. 08/30/22   Lauree Chandler, NP  dorzolamide-timolol (COSOPT) 22.3-6.8 MG/ML ophthalmic solution Place 1 drop into the right eye 2 (two) times daily. 08/30/22   Lauree Chandler, NP  Empagliflozin-metFORMIN HCl 5-500 MG  TABS Take 1 tablet by mouth 2 (two) times daily. 08/15/23   [name], MD  fluorometholone (FML) 0.1 % ophthalmic suspension Place 1 drop into both eyes daily. 08/31/22   Lauree Chandler, NP  ketoconazole (NIZORAL) 2 % cream Apply to both feet and between toes once daily for 6 weeks. 10/13/22   Galaway, Jennifer L, DPM  ROCKLATAN 0.02-0.005 % SOLN Place 1 drop into the right eye at bedtime. 08/30/22   Lauree Chandler, NP  sildenafil (REVATIO) 20 MG tablet Take 2-5 tablets (40-100 mg total) by mouth daily as needed (prior to sexual activity). 07/25/23   [name], MD      Allergies    Patient has no known allergies.    Review of Systems   Review of Systems  Respiratory:  Positive for shortness of breath.   All other systems reviewed and are negative.   Physical Exam Updated Vital Signs BP (!) 138/99 (BP Location: Right Arm)   Pulse 71   Temp 97.8 F (36.6 C) (Oral)   Resp (!) 21   Ht 5\' 9"  (1.753 m)   Wt 97.5 kg   SpO2 98%   BMI 31.75 kg/m  Physical Exam Vitals and nursing note reviewed.  Constitutional:      General: He is in acute distress.     Appearance: He is well-developed.  HENT:     Head: Normocephalic and atraumatic.  Cardiovascular:     Rate and  Rhythm: Normal rate and regular rhythm.     Heart sounds: No murmur heard. Pulmonary:     Effort: Pulmonary effort is normal. No respiratory distress.     Comments: Crackles bilaterally, right greater than left Abdominal:     Palpations: Abdomen is soft.     Tenderness: There is no abdominal tenderness. There is no guarding or rebound.  Musculoskeletal:        General: No swelling or tenderness.  Skin:    General: Skin is warm and dry.  Neurological:     Mental Status: He is alert and oriented to person, place, and time.     Comments: Moves all extremities symmetrically  Psychiatric:        Behavior: Behavior normal.     ED Results / Procedures / Treatments   Labs (all labs ordered are  listed, but only abnormal results are displayed) Labs Reviewed  COMPREHENSIVE METABOLIC PANEL - Abnormal; Notable for the following components:      Result Value   Glucose, Bld 198 (*)    Calcium 8.7 (*)    Albumin 3.4 (*)    All other components within normal limits  BRAIN NATRIURETIC PEPTIDE - Abnormal; Notable for the following components:   B Natriuretic Peptide 207.8 (*)    All other components within normal limits  CBG MONITORING, ED - Abnormal; Notable for the following components:   Glucose-Capillary 202 (*)    All other components within normal limits  I-STAT ARTERIAL BLOOD GAS, ED - Abnormal; Notable for the following components:   pO2, Arterial 276 (*)    Potassium 3.4 (*)    All other components within normal limits  TROPONIN I (HIGH SENSITIVITY) - Abnormal; Notable for the following components:   Troponin I (High Sensitivity) 33 (*)    All other components within normal limits  TROPONIN I (HIGH SENSITIVITY) - Abnormal; Notable for the following components:   Troponin I (High Sensitivity) 35 (*)    All other components within normal limits  SARS CORONAVIRUS 2 BY RT PCR  CBC WITH DIFFERENTIAL/PLATELET  ETHANOL  I-STAT CG4 LACTIC ACID, ED    EKG EKG Interpretation Date/Time:  Monday October 01 2023 02:01:06 EST Ventricular Rate:  79 PR Interval:  160 QRS Duration:  119 QT Interval:  387 QTC Calculation: 444 R Axis:   27  Text Interpretation: Sinus rhythm LVH with secondary repolarization abnormality Confirmed by Tilden Fossa [phone]) on 10/01/2023 4:02:55 AM  Radiology CT Head Wo Contrast  Result Date: 10/01/2023 CLINICAL DATA:  62 year old male with altered mental status. Shortness of breath, hypoxia. Hypertensive, 255 systolic. EXAM: CT HEAD WITHOUT CONTRAST TECHNIQUE: Contiguous axial images were obtained from the base of the skull through the vertex without intravenous contrast. RADIATION DOSE REDUCTION: This exam was performed according to the  departmental dose-optimization program which includes automated exposure control, adjustment of the mA and/or kV according to patient size and/or use of iterative reconstruction technique. COMPARISON:  Brain MRI 02/16/2017.  Head CT 10/10/2021. FINDINGS: Brain: Chronic encephalomalacia posterior left MCA territory. Cerebral volume stable since 2022. No midline shift, mass effect, or evidence of intracranial mass lesion. Mild ex vacuo enlargement of the left lateral ventricle. No acute intracranial hemorrhage identified. Superimposed chronic small vessel disease including patchy bilateral white matter hypodensity and chronic heterogeneous hypodensity in the bilateral deep gray nuclei. Chronic bi thalamic involvement. Stable gray-white matter differentiation throughout the brain. Vascular: Calcified atherosclerosis at the skull base. No suspicious intracranial vascular hyperdensity. Skull: Intact.  No acute  osseous abnormality identified. Sinuses/Orbits: Mild paranasal sinus mucosal thickening has not significantly changed. Tympanic cavities and mastoids are well aerated. Other: Postoperative changes to both globes. Visualized scalp soft tissues are within normal limits. IMPRESSION: 1. No acute intracranial abnormality. 2. Stable non contrast CT appearance of advanced chronic small vessel disease, chronic left MCA infarct. Electronically Signed   By: Odessa Fleming M.D.   On: 10/01/2023 04:53   DG Chest Port 1 View  Result Date: 10/01/2023 CLINICAL DATA:  Shortness of breath EXAM: PORTABLE CHEST 1 VIEW COMPARISON:  10/10/2021 FINDINGS: Multifocal patchy right upper and lower lobe opacities, suspicious for pneumonia. Left lung is clear. No pleural effusion or pneumothorax. Heart is normal in size. IMPRESSION: Multifocal right lung pneumonia. Electronically Signed   By: Charline Bills M.D.   On: 10/01/2023 02:19    Procedures Procedures   CRITICAL CARE Performed by: Tilden Fossa   Total critical care time: 40  minutes  Critical care time was exclusive of separately billable procedures and treating other patients.  Critical care was necessary to treat or prevent imminent or life-threatening deterioration.  Critical care was time spent personally by me on the following activities: development of treatment plan with patient and/or surrogate as well as nursing, discussions with consultants, evaluation of patient's response to treatment, examination of patient, obtaining history from patient or surrogate, ordering and performing treatments and interventions, ordering and review of laboratory studies, ordering and review of radiographic studies, pulse oximetry and re-evaluation of patient's condition.  Medications Ordered in ED Medications  cefTRIAXone (ROCEPHIN) 1 g in sodium chloride 0.9 % 100 mL IVPB (0 g Intravenous Stopped 10/01/23 0325)  azithromycin (ZITHROMAX) tablet 500 mg (500 mg Oral Given 10/01/23 0356)    ED Course/ Medical Decision Making/ A&P                                 Medical Decision Making Amount and/or Complexity of Data Reviewed Labs: ordered. Radiology: ordered.  Risk Prescription drug management. Decision regarding hospitalization.   Patient with history of hypertension here for evaluation of difficulty breathing, had hypoxia and significant hypertension for EMS and was placed on CPAP.  Patient's blood pressure significantly improved with nitroglycerin administration prehospital.  At time of ED arrival patient transition to BiPAP for additional respiratory support.  He does have crackles bilaterally.  He does report using cocaine 2 days ago.  EKG is abnormal but similar when compared to priors.  Troponins are mildly elevated.  Chest x-ray with multifocal infiltrates in the right lung, concerning for infection versus asymmetric pulmonary edema.  Images personally reviewed and interpreted.  Given his presentation suspect his symptoms are more secondary to pulmonary edema over  infection at this time but will start antibiotics for possible pneumonia.  He is somewhat drowsy on reassessment.  No evidence of CO2 retention.  Work of breathing is appropriate.  CT head was obtained, which is negative for acute abnormality.  Medicine consulted for admission for hypoxic respiratory failure, likely secondary to acute pulmonary edema.        Final Clinical Impression(s) / ED Diagnoses Final diagnoses:  Acute pulmonary edema (HCC)  Shortness of breath    Rx / DC Orders ED Discharge Orders     None         Tilden Fossa, MD 10/01/23 [phone]

## 2023-10-02 ENCOUNTER — Other Ambulatory Visit (HOSPITAL_COMMUNITY): Payer: Self-pay

## 2023-10-02 ENCOUNTER — Ambulatory Visit: Payer: MEDICAID | Admitting: Podiatry

## 2023-10-02 ENCOUNTER — Encounter (HOSPITAL_COMMUNITY): Payer: Self-pay | Admitting: Internal Medicine

## 2023-10-02 DIAGNOSIS — J810 Acute pulmonary edema: Secondary | ICD-10-CM | POA: Diagnosis not present

## 2023-10-02 DIAGNOSIS — I10 Essential (primary) hypertension: Secondary | ICD-10-CM | POA: Diagnosis not present

## 2023-10-02 DIAGNOSIS — J9601 Acute respiratory failure with hypoxia: Secondary | ICD-10-CM | POA: Diagnosis not present

## 2023-10-02 LAB — CBC
HCT: 40.3 % (ref 39.0–52.0)
Hemoglobin: 14 g/dL (ref 13.0–17.0)
MCH: 31.5 pg (ref 26.0–34.0)
MCHC: 34.7 g/dL (ref 30.0–36.0)
MCV: 90.6 fL (ref 80.0–100.0)
Platelets: 252 10*3/uL (ref 150–400)
RBC: 4.45 MIL/uL (ref 4.22–5.81)
RDW: 14 % (ref 11.5–15.5)
WBC: 5.7 10*3/uL (ref 4.0–10.5)
nRBC: 0 % (ref 0.0–0.2)

## 2023-10-02 LAB — COMPREHENSIVE METABOLIC PANEL
ALT: 24 U/L (ref 0–44)
AST: 21 U/L (ref 15–41)
Albumin: 3.1 g/dL — ABNORMAL LOW (ref 3.5–5.0)
Alkaline Phosphatase: 56 U/L (ref 38–126)
Anion gap: 11 (ref 5–15)
BUN: 19 mg/dL (ref 6–20)
CO2: 24 mmol/L (ref 22–32)
Calcium: 8.9 mg/dL (ref 8.9–10.3)
Chloride: 102 mmol/L (ref 98–111)
Creatinine, Ser: 1.07 mg/dL (ref 0.61–1.24)
GFR, Estimated: >60 mL/min (ref >60)
Glucose, Bld: 267 mg/dL — ABNORMAL HIGH (ref 70–99)
Potassium: 3.8 mmol/L (ref 3.5–5.1)
Sodium: 137 mmol/L (ref 135–145)
Total Bilirubin: 0.9 mg/dL (ref <1.2)
Total Protein: 6.4 g/dL — ABNORMAL LOW (ref 6.5–8.1)

## 2023-10-02 LAB — MAGNESIUM: Magnesium: 2 mg/dL (ref 1.7–2.4)

## 2023-10-02 MED ORDER — CARVEDILOL 3.125 MG PO TABS
3.1250 mg | ORAL_TABLET | Freq: Two times a day (BID) | ORAL | 3 refills | Status: DC
  Filled 2023-10-02 (×2): qty 180, 90d supply, fill #0

## 2023-10-02 MED ORDER — EMPAGLIFLOZIN 10 MG PO TABS
10.0000 mg | ORAL_TABLET | Freq: Every day | ORAL | 3 refills | Status: DC
  Filled 2023-10-02: qty 30, 30d supply, fill #0

## 2023-10-02 MED ORDER — SIMBRINZA 1-0.2 % OP SUSP
1.0000 [drp] | Freq: Two times a day (BID) | OPHTHALMIC | 3 refills | Status: AC
  Filled 2023-10-02: qty 8, 60d supply, fill #0

## 2023-10-02 MED ORDER — FOLIC ACID 1 MG PO TABS
1.0000 mg | ORAL_TABLET | Freq: Every day | ORAL | 0 refills | Status: DC
  Filled 2023-10-02: qty 30, 30d supply, fill #0

## 2023-10-02 MED ORDER — ASPIRIN 81 MG PO TBEC
81.0000 mg | DELAYED_RELEASE_TABLET | Freq: Every day | ORAL | 3 refills | Status: DC
  Filled 2023-10-02: qty 90, 90d supply, fill #0

## 2023-10-02 MED ORDER — LUMIGAN 0.01 % OP SOLN
1.0000 [drp] | Freq: Every day | OPHTHALMIC | 3 refills | Status: DC
  Filled 2023-10-02 (×2): qty 2.5, 50d supply, fill #0

## 2023-10-02 MED ORDER — FUROSEMIDE 40 MG PO TABS
40.0000 mg | ORAL_TABLET | Freq: Every day | ORAL | 0 refills | Status: DC
  Filled 2023-10-02 (×2): qty 90, 90d supply, fill #0

## 2023-10-02 MED ORDER — METFORMIN HCL 500 MG PO TABS
500.0000 mg | ORAL_TABLET | Freq: Two times a day (BID) | ORAL | 3 refills | Status: DC
  Filled 2023-10-02: qty 180, 90d supply, fill #0

## 2023-10-02 MED ORDER — RHOPRESSA 0.02 % OP SOLN
1.0000 [drp] | Freq: Every day | OPHTHALMIC | 3 refills | Status: DC
  Filled 2023-10-02: qty 2.5, 30d supply, fill #0

## 2023-10-02 MED ORDER — FUROSEMIDE 40 MG PO TABS
40.0000 mg | ORAL_TABLET | Freq: Every day | ORAL | Status: DC
  Administered 2023-10-02: 40 mg via ORAL
  Filled 2023-10-02: qty 1

## 2023-10-02 MED ORDER — ROCKLATAN 0.02-0.005 % OP SOLN: 1.0000 [drp] | Freq: Every day | OPHTHALMIC | 0 refills | Status: DC

## 2023-10-02 MED ORDER — FOLIC ACID 1 MG PO TABS: 1.0000 mg | ORAL_TABLET | Freq: Every day | ORAL | 0 refills | Status: DC

## 2023-10-02 MED ORDER — ADULT MULTIVITAMIN W/MINERALS CH: 1.0000 | ORAL_TABLET | Freq: Every day | ORAL | 0 refills | Status: DC

## 2023-10-02 MED ORDER — LOSARTAN POTASSIUM 25 MG PO TABS: 25.0000 mg | ORAL_TABLET | Freq: Every day | ORAL | 0 refills | Status: DC

## 2023-10-02 MED ORDER — FUROSEMIDE 40 MG PO TABS: 40.0000 mg | ORAL_TABLET | Freq: Every day | ORAL | 0 refills | Status: DC

## 2023-10-02 MED ORDER — BRIMONIDINE TARTRATE 0.15 % OP SOLN: 1.0000 [drp] | Freq: Two times a day (BID) | OPHTHALMIC | 6 refills | Status: DC

## 2023-10-02 MED ORDER — ATORVASTATIN CALCIUM 80 MG PO TABS
80.0000 mg | ORAL_TABLET | Freq: Every day | ORAL | 3 refills | Status: DC
  Filled 2023-10-02: qty 90, 90d supply, fill #0

## 2023-10-02 MED ORDER — TIMOLOL MALEATE 0.5 % OP SOLN
1.0000 [drp] | Freq: Two times a day (BID) | OPHTHALMIC | 2 refills | Status: DC
  Filled 2023-10-02: qty 5, 50d supply, fill #0

## 2023-10-02 MED ORDER — EMPAGLIFLOZIN-METFORMIN HCL 5-500 MG PO TABS
1.0000 | ORAL_TABLET | Freq: Two times a day (BID) | ORAL | 3 refills | Status: DC
  Filled 2023-10-02: qty 180, 90d supply, fill #0

## 2023-10-02 MED ORDER — LOSARTAN POTASSIUM 25 MG PO TABS
25.0000 mg | ORAL_TABLET | Freq: Every day | ORAL | 3 refills | Status: DC
  Filled 2023-10-02: qty 90, 90d supply, fill #0

## 2023-10-02 MED ORDER — CARVEDILOL 3.125 MG PO TABS: 3.1250 mg | ORAL_TABLET | Freq: Two times a day (BID) | ORAL | 0 refills | Status: DC

## 2023-10-02 MED ORDER — FLUOROMETHOLONE 0.1 % OP SUSP: 1.0000 [drp] | Freq: Every day | OPHTHALMIC | 2 refills | Status: DC

## 2023-10-02 NOTE — TOC Initial Note (Addendum)
Transition of Care [facility]) - Initial/Assessment Note    Patient Details  Name: Brady Church MRN: [id number] Date of Birth: 09/23/1961  Transition of Care [facility]) CM/SW Contact:    Nicanor Bake Phone Number: [phone] 10/02/2023, 12:13 PM  Clinical Narrative:   HF CSW met with pt at bedside. Pt stated that he has been homeless for the past four years. Pt stated that he has been staying at the [facility] and [facility]. Pt stated that he has been working with a caseworker name Burley Saver, who has been assisting him with the housing application process. CSW will follow up with [employer] and inquire about housing application. CSW provided the pt with clothes and left it at bedside. Pt stated that he needed a bus pass to get [place] [facility] to sleep tonight since the [facility] is closed. CSW will provide a bus pass for the pt.  CSW provided the pt with SA and housing resources. CSW called [employer] to inquire about the pts housing process. No one answered the phone and left a VM asking to be called back. CSW provided the pt with bus pass. Pt stated that he often uses bus for transportation.   TOC will continue following.                     Patient Goals and CMS Choice            Expected Discharge Plan and Services                                              Prior Living Arrangements/Services                       Activities of Daily Living   ADL Screening (condition at time of admission) Independently performs ADLs?: Yes (appropriate for developmental age) Is the patient deaf or have difficulty hearing?: No Does the patient have difficulty seeing, even when wearing glasses/contacts?: Yes Does the patient have difficulty concentrating, remembering, or making decisions?: No  Permission Sought/Granted                  Emotional Assessment              Admission diagnosis:  Shortness of breath [R06.02] Acute  pulmonary edema (HCC) [J81.0] Acute hypoxic respiratory failure (HCC) [J96.01] Patient Active Problem List   Diagnosis Date Noted   Acute hypoxic respiratory failure (HCC) 10/01/2023   Nephrolithiasis 08/30/2023   Cannabis use disorder 08/24/2022   Housing instability 08/24/2022   Neuropathic pain of hand, right 01/11/2022   Bilateral foot pain 07/08/2021   Vitamin B12 deficiency 08/06/2019   Erectile dysfunction 05/06/2018   Post-nasal drip 10/01/2017   Peripheral neuropathy 08/06/2017   Adjustment disorder with mixed anxiety and depressed mood    CVA (cerebral vascular accident) (HCC) 02/19/2017   Intracranial vascular stenosis 02/16/2017   Cocaine use disorder, severe, dependence (HCC)    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 [place] [address] Phone: [phone] Fax: [phone]     Social Determinants of Health (SDOH) Social History: SDOH Screenings   Food Insecurity: Food Insecurity Present (10/01/2023)  Housing: Patient Declined (10/02/2023)  Transportation Needs: Unmet Transportation Needs (10/01/2023)  Utilities: At Risk (10/01/2023)  Alcohol Screen: Low Risk  (10/02/2023)  Depression (PHQ2-9): Low Risk  (08/29/2023)  Financial Resource Strain: High Risk (10/02/2023)  Social Connections: Socially Isolated (10/11/2022)  Tobacco Use: Medium Risk (10/01/2023)   SDOH  Interventions: Housing Interventions: Other (Comment) Financial Strain Interventions: Other (Comment)   Readmission Risk Interventions     No data to display

## 2023-10-02 NOTE — TOC Transition Note (Signed)
Transition of Care [facility]) - CM/SW Discharge Note   Patient Details  Name: Brady Church MRN: [id number] Date of Birth: [birth year]  [facility]) CM/SW Contact:  Nicanor Bake Phone Number: [phone] 10/02/2023, 3:21 PM   Clinical Narrative:   CSW met with pt at bedside to give him a bus pass. Pt stated that he was going to contact a family friends to see if he could stay with them for a few days. If not, he plans to go to the [employer].     Final next level of care: Homeless Shelter Barriers to Discharge: No Barriers Identified   Patient Goals and CMS Choice      Discharge Placement                         Discharge Plan and Services Additional resources added to the After Visit Summary for                                       Social Determinants of Health (SDOH) Interventions SDOH Screenings   Food Insecurity: Food Insecurity Present (10/01/2023)  Housing: Patient Declined (10/02/2023)  Transportation Needs: Unmet Transportation Needs (10/01/2023)  Utilities: At Risk (10/01/2023)  Alcohol Screen: Low Risk  (10/02/2023)  Depression (PHQ2-9): Low Risk  (08/29/2023)  Financial Resource Strain: High Risk (10/02/2023)  Social Connections: Socially Isolated (10/11/2022)  Tobacco Use: Medium Risk (10/01/2023)     Readmission Risk Interventions     No data to display

## 2023-10-02 NOTE — Progress Notes (Signed)
Mobility Specialist Progress Note:    10/02/23 1120  Mobility  Activity Ambulated with assistance in hallway  Level of Assistance Contact guard assist, steadying assist  Assistive Device Other (Comment) (Walking Stick)  Distance Ambulated (ft) 450 ft  Activity Response Tolerated well  Mobility Referral Yes  $Mobility charge 1 Mobility  Mobility Specialist Start Time (ACUTE ONLY) 1120  Mobility Specialist Stop Time (ACUTE ONLY) 1133  Mobility Specialist Time Calculation (min) (ACUTE ONLY) 13 min   Pt received in bed, agreeable to mobility session. Ambulated in hallway with gait belt and walking stick to navigate. Tolerated well, asx throughout. Returned pt to room, all needs met.   Feliciana Rossetti Mobility Specialist Please contact via Special educational needs teacher or  Rehab office at [phone]

## 2023-10-02 NOTE — Discharge Summary (Addendum)
Name: Brady Church MRN: [id number] DOB: December 24, 1960 62 y.o. PCP: [name], MD  Date of Admission: 10/01/2023  1:46 AM Date of Discharge: 10/02/2023 4:04 PM Attending Physician: Dr. Heide Spark  Discharge Diagnosis: Principal Problem:   Acute hypoxic respiratory failure [facility])    Discharge Medications: Allergies as of 10/02/2023   No Known Allergies      Medication List     STOP taking these medications    Empagliflozin-metFORMIN HCl 5-500 MG Tabs   Lumigan 0.01 % Soln Generic drug: bimatoprost       TAKE these medications    aspirin EC 81 MG tablet Take 1 tablet (81 mg total) by mouth daily. Swallow whole.   atorvastatin 80 MG tablet Commonly known as: LIPITOR Take 1 tablet (80 mg total) by mouth daily at 6 PM.   brimonidine 0.15 % ophthalmic solution Commonly known as: ALPHAGAN Place 1 drop into both eyes 2 (two) times daily.   carvedilol 3.125 MG tablet Commonly known as: COREG Take 1 tablet (3.125 mg total) by mouth 2 (two) times daily with a meal.   fluorometholone 0.1 % ophthalmic suspension Commonly known as: FML Place 1 drop into both eyes daily.   folic acid 1 MG tablet Commonly known as: FOLVITE Take 1 tablet (1 mg total) by mouth daily.   furosemide 40 MG tablet Commonly known as: LASIX Take 1 tablet (40 mg total) by mouth daily.   Jardiance 10 MG Tabs tablet Generic drug: empagliflozin Take 1 tablet (10 mg total) by mouth daily before breakfast.   losartan 25 MG tablet Commonly known as: COZAAR Take 1 tablet (25 mg total) by mouth daily.   metFORMIN 500 MG tablet Commonly known as: GLUCOPHAGE Take 1 tablet (500 mg total) by mouth 2 (two) times daily with a meal.   multivitamin with minerals Tabs tablet Take 1 tablet by mouth daily.   Rhopressa 0.02 % Soln Generic drug: Netarsudil Dimesylate Apply 1 drop to eye at bedtime. Use in the evening   Rocklatan 0.02-0.005 % Soln Generic drug: Netarsudil-Latanoprost Place 1  drop into the right eye at bedtime.   Simbrinza 1-0.2 % Susp Generic drug: Brinzolamide-Brimonidine Place 1 drop into the right eye 2 (two) times daily.   timolol 0.5 % ophthalmic solution Commonly known as: TIMOPTIC Place 1 drop into the right eye 2 (two) times daily.        Disposition and follow-up:   Brady Church was discharged from [facility] in Stable condition.  At the hospital follow up visit please address:  1.  Follow-up:   Acute on chronic hypoxic failure   HFrEF ( 35-40 %)            -Etiology was multifactorial in the setting of new EF of 35 to 40% and Cocaine use            - Discharged on Furosemide 40 mg daily,carvedilol 3.125 MG  twice daily and Losartan 25 mg daily           - Kindly assess his adherence to his medications            - Please repeat his CMP to assess his electrolytes               T2DM  - On Empagliflozin-metFORMIN HCl 5-500 mg   - Last A1c on 08/29/2023 was 7.8  - Please follow up with routine A1c check  and urinalysis to assess diabetes control and proteinuria    Glaucoma  Legal blindness   - On Lumigan, Rhopressa, Rocklatan, Simbrinza and Timolol  2.  Labs / imaging needed at time of follow-up: CMP,CBC  3.  Pending labs/ test needing follow-up: [name]  4.  Medication Changes  ADDED - Furosemide 40 mg daily  Follow-up Appointments:  Follow-up Information     [facility]. Go in 13 day(s).   Specialty: Cardiology Why: Hospital follow up 10/15/2023 @ 12 noon PLEASE bring a current medicationlist to appointment FREE valet parking, Entrance C, off [employer] information: [address] [place] [address] [phone]                Hospital Course by problem list: Acute on chronic hypoxic failure  HFrEF Patient is 62 year old male with a past medical history of polysubstance use disorder (alcohol use/cocaine use), type 2  diabetes, hyperlipidemia, hypertension, glaucoma and legal blindness who presented to the ED with worsening shortness of breath over the last 2 to 3 days in the setting of recent cocaine and alcohol use and was found to be hypoxic with O2 sats in the 80s with significantly elevated blood pressures up to 250/130. Imaging done in the ED showed multifocal right-sided opacities concerning for possible underlying pneumonia for which he received antibiotics in the ED and placed on BiPAP. Patient was weaned off BiPAP to Room air with great saturations. ECHO done on 10/01/2023 showed an EF of 35 to 40% which is decreased from his prior as well as grade 2 diastolic dysfunction but significantly improved on 10/02/2023 requiring no oxygen and was in no acute distress whatsoever and was stable for discharge. Patient was discharged with  furosemide 40 mg daily, carvedilol 3.125 mg BID losartan 25 mg daily and Jardiance 10 mg daily (combined with metformin). Available housing resources was provided to patient as well.    Alcohol use disorder Cocaine use disorder  Patient has a history of chronic alcohol and cocaine use. His Ethanol level during this admission was negative. CIWA protocol was reassuring that he wasn't withdrawing. Patient was adequately counseled to stay away from alcohol or cocaine or any illicit drugs. Risks of alcohol use in the setting of his newly reduced ejection fraction explained to patient.     Discharge Subjective: Patient was laying in bed in no acute distress. Says he feels " much better " than he came in. Patient expressed gratitude for the care he is been given by the medical team. He has no complaints at this time, denies any shortness of breath or chest. He is agreeable with the plan to discharge    Discharge Exam:   Blood pressure (!) 130/94, pulse 71, temperature 98.5 F (36.9 C), temperature source Oral, resp. rate 18, height 5\' 9"  (1.753 m), weight 97.5 kg, SpO2 100%.   Constitutional:well-appearing man, sitting in bed, in no acute distress HENT: normocephalic atraumatic, mucous membranes moist Cardiovascular: regular rate and rhythm. No murmurs appreciated  Pulmonary/Chest: normal work of breathing on room air,  Neurological: alert & oriented x 3 MSK: no gross abnormalities. No pitting edema Skin: warm and dry Psych: Normal mood and affect  Pertinent Labs, Studies, and Procedures:     Latest Ref Rng & Units 10/02/2023    5:25 AM 10/01/2023    2:26 AM 10/01/2023    1:58 AM  CBC  WBC 4.0 - 10.5 K/uL 5.7   6.5   Hemoglobin 13.0 - 17.0 g/dL 91.4  78.2  95.6   Hematocrit 39.0 -  52.0 % 40.3  39.0  42.0   Platelets 150 - 400 K/uL 252   351        Latest Ref Rng & Units 10/02/2023    5:25 AM 10/01/2023    2:26 AM 10/01/2023    1:58 AM  CMP  Glucose 70 - 99 mg/dL 865   784   BUN 6 - 20 mg/dL 19   15   Creatinine 6.96 - 1.24 mg/dL 2.95   2.84   Sodium 132 - 145 mmol/L 137  137  136   Potassium 3.5 - 5.1 mmol/L 3.8  3.4  4.2   Chloride 98 - 111 mmol/L 102   101   CO2 22 - 32 mmol/L 24   22   Calcium 8.9 - 10.3 mg/dL 8.9   8.7   Total Protein 6.5 - 8.1 g/dL 6.4   6.8   Total Bilirubin <1.2 mg/dL 0.9   0.7   Alkaline Phos 38 - 126 U/L 56   62   AST 15 - 41 U/L 21   28   ALT 0 - 44 U/L 24   30     ECHOCARDIOGRAM COMPLETE  Result Date: 10/01/2023    ECHOCARDIOGRAM REPORT   Patient Name:   Brady Church Date of Exam: 10/01/2023 Medical Rec #:  [id number]       Height:       69.0 in Accession #:    [id number]      Weight:       215.0 lb Date of Birth:  19-May-1961      BSA:          2.130 m Patient Age:    61 years        BP:           149/109 mmHg Patient Gender: M               HR:           77 bpm. Exam Location:  Inpatient Procedure: 2D Echo, Cardiac Doppler and Color Doppler Indications:    Dyspnea R06.00  History:        Patient has no prior history of Echocardiogram examinations.                 Risk Factors:Diabetes and Hypertension.  Sonographer:     Darlys Gales Referring Phys: [id number] NISCHAL NARENDRA IMPRESSIONS  1. Left ventricular ejection fraction, by estimation, is 35 to 40%. The left ventricle has moderately decreased function. The left ventricle demonstrates global hypokinesis. There is mild concentric left ventricular hypertrophy. Left ventricular diastolic parameters are consistent with Grade II diastolic dysfunction (pseudonormalization).  2. Right ventricular systolic function is normal. The right ventricular size is normal.  3. The mitral valve is normal in structure. Trivial mitral valve regurgitation. No evidence of mitral stenosis.  4. The aortic valve is tricuspid. There is mild calcification of the aortic valve. Aortic valve regurgitation is not visualized. Aortic valve sclerosis/calcification is present, without any evidence of aortic stenosis.  5. The inferior vena cava is normal in size with greater than 50% respiratory variability, suggesting right atrial pressure of 3 mmHg. FINDINGS  Left Ventricle: Left ventricular ejection fraction, by estimation, is 35 to 40%. The left ventricle has moderately decreased function. The left ventricle demonstrates global hypokinesis. The left ventricular internal cavity size was normal in size. There is mild concentric left ventricular hypertrophy. Left ventricular diastolic parameters are consistent with Grade II diastolic dysfunction (pseudonormalization). Right Ventricle: The  right ventricular size is normal. No increase in right ventricular wall thickness. Right ventricular systolic function is normal. Left Atrium: Left atrial size was normal in size. Right Atrium: Right atrial size was normal in size. Pericardium: There is no evidence of pericardial effusion. Mitral Valve: The mitral valve is normal in structure. Trivial mitral valve regurgitation. No evidence of mitral valve stenosis. Tricuspid Valve: The tricuspid valve is normal in structure. Tricuspid valve regurgitation is not demonstrated. No  evidence of tricuspid stenosis. Aortic Valve: The aortic valve is tricuspid. There is mild calcification of the aortic valve. Aortic valve regurgitation is not visualized. Aortic valve sclerosis/calcification is present, without any evidence of aortic stenosis. Aortic valve mean gradient measures 3.0 mmHg. Aortic valve peak gradient measures 4.7 mmHg. Aortic valve area, by VTI measures 2.42 cm. Pulmonic Valve: The pulmonic valve was not well visualized. Pulmonic valve regurgitation is not visualized. No evidence of pulmonic stenosis. Aorta: The aortic root is normal in size and structure. Venous: The inferior vena cava is normal in size with greater than 50% respiratory variability, suggesting right atrial pressure of 3 mmHg. IAS/Shunts: No atrial level shunt detected by color flow Doppler.  LEFT VENTRICLE PLAX 2D LVIDd:         6.00 cm   Diastology LVIDs:         5.00 cm   LV e' medial:    4.13 cm/s LV PW:         1.20 cm   LV E/e' medial:  18.4 LV IVS:        1.30 cm   LV e' lateral:   5.22 cm/s LVOT diam:     1.90 cm   LV E/e' lateral: 14.5 LV SV:         55 LV SV Index:   26 LVOT Area:     2.84 cm  RIGHT VENTRICLE RV S prime:     15.40 cm/s TAPSE (M-mode): 1.9 cm LEFT ATRIUM             Index        RIGHT ATRIUM           Index LA Vol (A2C):   34.5 ml 16.19 ml/m  RA Area:     12.40 cm LA Vol (A4C):   30.8 ml 14.46 ml/m  RA Volume:   25.70 ml  12.06 ml/m LA Biplane Vol: 35.9 ml 16.85 ml/m  AORTIC VALVE AV Area (Vmax):    2.38 cm AV Area (Vmean):   2.34 cm AV Area (VTI):     2.42 cm AV Vmax:           108.00 cm/s AV Vmean:          85.000 cm/s AV VTI:            0.227 m AV Peak Grad:      4.7 mmHg AV Mean Grad:      3.0 mmHg LVOT Vmax:         90.80 cm/s LVOT Vmean:        70.200 cm/s LVOT VTI:          0.194 m LVOT/AV VTI ratio: 0.85  AORTA Ao Root diam: 3.70 cm MITRAL VALVE MV Area (PHT): 3.17 cm    SHUNTS MV Decel Time: 239 msec    Systemic VTI:  0.19 m MV E velocity: 75.90 cm/s  Systemic Diam: 1.90  cm MV A velocity: 68.60 cm/s MV E/A ratio:  1.11 Arvilla Meres MD Electronically signed by  Arvilla Meres MD Signature Date/Time: 10/01/2023/1:37:34 PM    Final    CT Head Wo Contrast  Result Date: 10/01/2023 CLINICAL DATA:  62 year old male with altered mental status. Shortness of breath, hypoxia. Hypertensive, 255 systolic. EXAM: CT HEAD WITHOUT CONTRAST TECHNIQUE: Contiguous axial images were obtained from the base of the skull through the vertex without intravenous contrast. RADIATION DOSE REDUCTION: This exam was performed according to the departmental dose-optimization program which includes automated exposure control, adjustment of the mA and/or kV according to patient size and/or use of iterative reconstruction technique. COMPARISON:  Brain MRI 02/16/2017.  Head CT 10/10/2021. FINDINGS: Brain: Chronic encephalomalacia posterior left MCA territory. Cerebral volume stable since 2022. No midline shift, mass effect, or evidence of intracranial mass lesion. Mild ex vacuo enlargement of the left lateral ventricle. No acute intracranial hemorrhage identified. Superimposed chronic small vessel disease including patchy bilateral white matter hypodensity and chronic heterogeneous hypodensity in the bilateral deep gray nuclei. Chronic bi thalamic involvement. Stable gray-white matter differentiation throughout the brain. Vascular: Calcified atherosclerosis at the skull base. No suspicious intracranial vascular hyperdensity. Skull: Intact.  No acute osseous abnormality identified. Sinuses/Orbits: Mild paranasal sinus mucosal thickening has not significantly changed. Tympanic cavities and mastoids are well aerated. Other: Postoperative changes to both globes. Visualized scalp soft tissues are within normal limits. IMPRESSION: 1. No acute intracranial abnormality. 2. Stable non contrast CT appearance of advanced chronic small vessel disease, chronic left MCA infarct. Electronically Signed   By: Odessa Fleming M.D.   On:  10/01/2023 04:53   DG Chest Port 1 View  Result Date: 10/01/2023 CLINICAL DATA:  Shortness of breath EXAM: PORTABLE CHEST 1 VIEW COMPARISON:  10/10/2021 FINDINGS: Multifocal patchy right upper and lower lobe opacities, suspicious for pneumonia. Left lung is clear. No pleural effusion or pneumothorax. Heart is normal in size. IMPRESSION: Multifocal right lung pneumonia. Electronically Signed   By: Charline Bills M.D.   On: 10/01/2023 02:19     Discharge Instructions: Discharge Instructions     Call MD for:   Complete by: As directed    Call MD for:  difficulty breathing, headache or visual disturbances   Complete by: As directed    Call MD for:  extreme fatigue   Complete by: As directed    Call MD for:  hives   Complete by: As directed    Call MD for:  persistant dizziness or light-headedness   Complete by: As directed    Call MD for:  persistant nausea and vomiting   Complete by: As directed    Call MD for:  redness, tenderness, or signs of infection (pain, swelling, redness, odor or green/yellow discharge around incision site)   Complete by: As directed    Call MD for:  severe uncontrolled pain   Complete by: As directed    Call MD for:  temperature >100.4   Complete by: As directed    Diet - low sodium heart healthy   Complete by: As directed    Discharge instructions   Complete by: As directed    Dear Brady Church  It was a pleasure taking care of Brady Church  at [facility].He came in due to shortness of breath, had fluids in your lungs, and were found to have heart failure (your heart is not pumping adequate blood out to your body).  We are discharging you because you feels better.   It is very important that Brady Church follow the following instructions.  1) Brady Church has been  diagnosed with heart failure. It is VERY IMPORTANT he take  these medications. -Carvedilol 3.125 mg tablets every 12 hours -Take Lasix 40 mg (one tablet) daily -Take Losartan 25  mg daily daily -Jardiance 10 mg daily  2) For Brady Church diabetes: Stop taking the Empagliflozin-metformin combination Start taking Jardiance 10 mg daily And the Metformin alone 500 mg tablets every 12 hours  3) Continue taking your aspirin and lipitor 80 mg daily  4) You will need to be seen in two different clinics when you leave the hospital: -10/15/2023 Heart failure team (heart doctors) at [facility] and vascular center at 12 PM - 10/23/2023  [facility] at 9:45 AM with Dr. Daiva Eves - Please make sure you discuss taking the medications that were prescribed and the ones that you had from before. Please help this patient organize his medications as he is legally blind and has high risk of getting sick if not taking his medications.  Take care,  Dr. Kathleen Lime, MD   Morene Crocker, MD   Increase activity slowly   Complete by: As directed        Signed: Kathleen Lime, MD [facility] - PGY1 Pager: [phone] 10/02/2023, 4:04 PM    Please contact the on call pager after 5 pm and on weekends at [phone].

## 2023-10-02 NOTE — Progress Notes (Addendum)
Rounding Note    Patient Name: Brady Church Date of Encounter: 10/02/2023  [facility] Cardiologist: None   Subjective   He is doing well this AM. Denies dyspnea.   Inpatient Medications    Scheduled Meds:  aspirin EC  81 mg Oral Daily   atorvastatin  80 mg Oral q1800   brimonidine  1 drop Both Eyes BID   carvedilol  3.125 mg Oral BID WC   fluorometholone  1 drop Both Eyes Daily   folic acid  1 mg Oral Daily   losartan  25 mg Oral Daily   multivitamin with minerals  1 tablet Oral Daily   rivaroxaban  10 mg Oral Daily   thiamine  100 mg Oral Daily   Or   thiamine  100 mg Intravenous Daily   Continuous Infusions:  PRN Meds:    Vital Signs    Vitals:   10/01/23 1600 10/01/23 2127 10/02/23 0816 10/02/23 0828  BP: 121/77 116/74 (!) 143/102 (!) 130/94  Pulse: 86 80 69 71  Resp: 16 18 18 18   Temp: 98.2 F (36.8 C) 98.2 F (36.8 C) 98 F (36.7 C) 98.5 F (36.9 C)  TempSrc: Oral Oral Oral Oral  SpO2: 91% 94% 99% 100%  Weight:      Height:        Intake/Output Summary (Last 24 hours) at 10/02/2023 0859 Last data filed at 10/02/2023 0800 Gross per 24 hour  Intake 240 ml  Output --  Net 240 ml      10/01/2023    1:59 AM 08/29/2023    9:56 AM 07/27/2023    8:03 AM  Last 3 Weights  Weight (lbs) 215 lb 215 lb 8 oz 230 lb  Weight (kg) 97.523 kg 97.75 kg 104.327 kg      Telemetry    Sinus rhythm - Personally Reviewed  ECG    NA - Personally Reviewed  Physical Exam   Vitals:   10/02/23 0816 10/02/23 0828  BP: (!) 143/102 (!) 130/94  Pulse: 69 71  Resp: 18 18  Temp: 98 F (36.7 C) 98.5 F (36.9 C)  SpO2: 99% 100%    GEN: No acute distress.   Neck: No JVD Cardiac: RRR, no murmurs, rubs, or gallops.  Respiratory: Clear to auscultation bilaterally. GI: Soft, nontender, non-distended  MS: No edema; No deformity. Neuro:  Nonfocal  Psych: Normal affect   Labs    High Sensitivity Troponin:   Recent Labs  Lab 10/01/23 0158  10/01/23 0358 10/01/23 1344  TROPONINIHS 33* 35* 27*     Chemistry Recent Labs  Lab 10/01/23 0158 10/01/23 0226 10/02/23 0525  NA 136 137 137  K 4.2 3.4* 3.8  CL 101  --  102  CO2 22  --  24  GLUCOSE 198*  --  267*  BUN 15  --  19  CREATININE 1.04  --  1.07  CALCIUM 8.7*  --  8.9  MG  --   --  2.0  PROT 6.8  --  6.4*  ALBUMIN 3.4*  --  3.1*  AST 28  --  21  ALT 30  --  24  ALKPHOS 62  --  56  BILITOT 0.7  --  0.9  GFRNONAA >60  --  >60  ANIONGAP 13  --  11    Lipids No results for input(s): "CHOL", "TRIG", "HDL", "LABVLDL", "LDLCALC", "CHOLHDL" in the last 168 hours.  Hematology Recent Labs  Lab 10/01/23 0158 10/01/23 0226 10/02/23  0525  WBC 6.5  --  5.7  RBC 4.45  --  4.45  HGB 14.5 13.3 14.0  HCT 42.0 39.0 40.3  MCV 94.4  --  90.6  MCH 32.6  --  31.5  MCHC 34.5  --  34.7  RDW 14.2  --  14.0  PLT 351  --  252   Thyroid No results for input(s): "TSH", "FREET4" in the last 168 hours.  BNP Recent Labs  Lab 10/01/23 0155  BNP 207.8*    DDimer No results for input(s): "DDIMER" in the last 168 hours.   Radiology    ECHOCARDIOGRAM COMPLETE  Result Date: 10/01/2023    ECHOCARDIOGRAM REPORT   Patient Name:   Brady Church Date of Exam: 10/01/2023 Medical Rec #:  [id number]       Height:       69.0 in Accession #:    [id number]      Weight:       215.0 lb Date of Birth:  December 18, 1960      BSA:          2.130 m Patient Age:    61 years        BP:           149/109 mmHg Patient Gender: M               HR:           77 bpm. Exam Location:  Inpatient Procedure: 2D Echo, Cardiac Doppler and Color Doppler Indications:    Dyspnea R06.00  History:        Patient has no prior history of Echocardiogram examinations.                 Risk Factors:Diabetes and Hypertension.  Sonographer:    Darlys Gales Referring Phys: [id number] NISCHAL NARENDRA IMPRESSIONS  1. Left ventricular ejection fraction, by estimation, is 35 to 40%. The left ventricle has moderately decreased function. The  left ventricle demonstrates global hypokinesis. There is mild concentric left ventricular hypertrophy. Left ventricular diastolic parameters are consistent with Grade II diastolic dysfunction (pseudonormalization).  2. Right ventricular systolic function is normal. The right ventricular size is normal.  3. The mitral valve is normal in structure. Trivial mitral valve regurgitation. No evidence of mitral stenosis.  4. The aortic valve is tricuspid. There is mild calcification of the aortic valve. Aortic valve regurgitation is not visualized. Aortic valve sclerosis/calcification is present, without any evidence of aortic stenosis.  5. The inferior vena cava is normal in size with greater than 50% respiratory variability, suggesting right atrial pressure of 3 mmHg. FINDINGS  Left Ventricle: Left ventricular ejection fraction, by estimation, is 35 to 40%. The left ventricle has moderately decreased function. The left ventricle demonstrates global hypokinesis. The left ventricular internal cavity size was normal in size. There is mild concentric left ventricular hypertrophy. Left ventricular diastolic parameters are consistent with Grade II diastolic dysfunction (pseudonormalization). Right Ventricle: The right ventricular size is normal. No increase in right ventricular wall thickness. Right ventricular systolic function is normal. Left Atrium: Left atrial size was normal in size. Right Atrium: Right atrial size was normal in size. Pericardium: There is no evidence of pericardial effusion. Mitral Valve: The mitral valve is normal in structure. Trivial mitral valve regurgitation. No evidence of mitral valve stenosis. Tricuspid Valve: The tricuspid valve is normal in structure. Tricuspid valve regurgitation is not demonstrated. No evidence of tricuspid stenosis. Aortic Valve: The aortic valve is tricuspid. There is mild calcification  of the aortic valve. Aortic valve regurgitation is not visualized. Aortic valve  sclerosis/calcification is present, without any evidence of aortic stenosis. Aortic valve mean gradient measures 3.0 mmHg. Aortic valve peak gradient measures 4.7 mmHg. Aortic valve area, by VTI measures 2.42 cm. Pulmonic Valve: The pulmonic valve was not well visualized. Pulmonic valve regurgitation is not visualized. No evidence of pulmonic stenosis. Aorta: The aortic root is normal in size and structure. Venous: The inferior vena cava is normal in size with greater than 50% respiratory variability, suggesting right atrial pressure of 3 mmHg. IAS/Shunts: No atrial level shunt detected by color flow Doppler.  LEFT VENTRICLE PLAX 2D LVIDd:         6.00 cm   Diastology LVIDs:         5.00 cm   LV e' medial:    4.13 cm/s LV PW:         1.20 cm   LV E/e' medial:  18.4 LV IVS:        1.30 cm   LV e' lateral:   5.22 cm/s LVOT diam:     1.90 cm   LV E/e' lateral: 14.5 LV SV:         55 LV SV Index:   26 LVOT Area:     2.84 cm  RIGHT VENTRICLE RV S prime:     15.40 cm/s TAPSE (M-mode): 1.9 cm LEFT ATRIUM             Index        RIGHT ATRIUM           Index LA Vol (A2C):   34.5 ml 16.19 ml/m  RA Area:     12.40 cm LA Vol (A4C):   30.8 ml 14.46 ml/m  RA Volume:   25.70 ml  12.06 ml/m LA Biplane Vol: 35.9 ml 16.85 ml/m  AORTIC VALVE AV Area (Vmax):    2.38 cm AV Area (Vmean):   2.34 cm AV Area (VTI):     2.42 cm AV Vmax:           108.00 cm/s AV Vmean:          85.000 cm/s AV VTI:            0.227 m AV Peak Grad:      4.7 mmHg AV Mean Grad:      3.0 mmHg LVOT Vmax:         90.80 cm/s LVOT Vmean:        70.200 cm/s LVOT VTI:          0.194 m LVOT/AV VTI ratio: 0.85  AORTA Ao Root diam: 3.70 cm MITRAL VALVE MV Area (PHT): 3.17 cm    SHUNTS MV Decel Time: 239 msec    Systemic VTI:  0.19 m MV E velocity: 75.90 cm/s  Systemic Diam: 1.90 cm MV A velocity: 68.60 cm/s MV E/A ratio:  1.11 Arvilla Meres MD Electronically signed by Arvilla Meres MD Signature Date/Time: 10/01/2023/1:37:34 PM    Final    CT Head Wo  Contrast  Result Date: 10/01/2023 CLINICAL DATA:  62 year old male with altered mental status. Shortness of breath, hypoxia. Hypertensive, 255 systolic. EXAM: CT HEAD WITHOUT CONTRAST TECHNIQUE: Contiguous axial images were obtained from the base of the skull through the vertex without intravenous contrast. RADIATION DOSE REDUCTION: This exam was performed according to the departmental dose-optimization program which includes automated exposure control, adjustment of the mA and/or kV according to patient size and/or use of iterative reconstruction  technique. COMPARISON:  Brain MRI 02/16/2017.  Head CT 10/10/2021. FINDINGS: Brain: Chronic encephalomalacia posterior left MCA territory. Cerebral volume stable since 2022. No midline shift, mass effect, or evidence of intracranial mass lesion. Mild ex vacuo enlargement of the left lateral ventricle. No acute intracranial hemorrhage identified. Superimposed chronic small vessel disease including patchy bilateral white matter hypodensity and chronic heterogeneous hypodensity in the bilateral deep gray nuclei. Chronic bi thalamic involvement. Stable gray-white matter differentiation throughout the brain. Vascular: Calcified atherosclerosis at the skull base. No suspicious intracranial vascular hyperdensity. Skull: Intact.  No acute osseous abnormality identified. Sinuses/Orbits: Mild paranasal sinus mucosal thickening has not significantly changed. Tympanic cavities and mastoids are well aerated. Other: Postoperative changes to both globes. Visualized scalp soft tissues are within normal limits. IMPRESSION: 1. No acute intracranial abnormality. 2. Stable non contrast CT appearance of advanced chronic small vessel disease, chronic left MCA infarct. Electronically Signed   By: Odessa Fleming M.D.   On: 10/01/2023 04:53   DG Chest Port 1 View  Result Date: 10/01/2023 CLINICAL DATA:  Shortness of breath EXAM: PORTABLE CHEST 1 VIEW COMPARISON:  10/10/2021 FINDINGS: Multifocal  patchy right upper and lower lobe opacities, suspicious for pneumonia. Left lung is clear. No pleural effusion or pneumothorax. Heart is normal in size. IMPRESSION: Multifocal right lung pneumonia. Electronically Signed   By: Charline Bills M.D.   On: 10/01/2023 02:19    Cardiac Studies   TTE 10/01/2023 1. Left ventricular ejection fraction, by estimation, is 35 to 40%. The  left ventricle has moderately decreased function. The left ventricle  demonstrates global hypokinesis. There is mild concentric left ventricular  hypertrophy. Left ventricular  diastolic parameters are consistent with Grade II diastolic dysfunction  (pseudonormalization).   2. Right ventricular systolic function is normal. The right ventricular  size is normal.   3. The mitral valve is normal in structure. Trivial mitral valve  regurgitation. No evidence of mitral stenosis.   4. The aortic valve is tricuspid. There is mild calcification of the  aortic valve. Aortic valve regurgitation is not visualized. Aortic valve  sclerosis/calcification is present, without any evidence of aortic  stenosis.   5. The inferior vena cava is normal in size with greater than 50%  respiratory variability, suggesting right atrial pressure of 3 mmHg.   Patient Profile     Brady Church is a 62 y.o. male with a hx of polysubstance use disorder (alcohol, cocaine, tobacco use), systolic heart failure, type 2 DM, HLD, HTN, glaucoma, legal blindness, who is being seen 10/01/2023 for the evaluation of CHF at the request of Dr. Heide Spark. Mr. Musick presented with hypertensive emergency and flash pulmonary edema. He has known systolic heart failure in the setting of hypertension/alcohol/cocaine. He had a prior ischemic eval several years ago that did not show any ischemia or scar. He is homeless and has not been able to be on consistent GDMT. Currently his clinical status has improved with diuresis.   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 Otherwise, he Brady be discharged from a cardiology perspective.   Time Spent Directly with Patient:  I have spent a total of 35 minutes with the patient reviewing  hospital notes, telemetry, EKGs, labs and examining the patient as well as establishing an assessment and plan that was discussed personally with the patient.  > 50% of time was spent in direct patient care.   For questions or updates, please contact  [facility] Please consult [email or web address] for contact info under        Signed, [name], MD  10/02/2023, 8:59 AM

## 2023-10-02 NOTE — Plan of Care (Signed)

## 2023-10-02 NOTE — Progress Notes (Signed)
Heart Failure Nurse Navigator Progress Note  PCP: [name], MD PCP-Cardiologist: None Admission Diagnosis: Acute pulmonary edema, shortness of breath.  Admitted from: Home via EMS  Presentation:   Brady Church presented with shortness of breath, hypertensive emergency, BLE edema, sats on EMS arrival 85 %, placed on CPAP then BiPAP. BP 250/130 per EMS, given 2 Sublingual nitroglycerin. Reported used cocaine 2 days prior to admission. BNP 207.8, EKG showed sinus rhythm, CXR with multifocal right lung pneumonia. CT head with no acute intracranial abnormality.   Patient was educated verbally on the sign and symptoms of heart failure, daily weights ( currently homeless, will give patient a scale at HF [facility] appointment 11/18 per patients request because he has no place to keep a scale right now. ) Diet/ fluid restrictions ( patient reported that he tries to watch his salt intake, however he currently eats whatever he finds or gets at shelters ) education on taking all medications as prescribed and attending all medical appointments, Navigator spoke with HF CSW and updated her that patient is homeless, has no clothes to wear home ( EMS cut clothes upon arrival) and need for bus passes for HF [facility] appointment, she is currently working with her team to help get some of patients immediate needs met) Patient verbalized his understanding of all education. A HF TOC appointment was scheduled for 10/15/2023 @ 12 noon.   ECHO/ LVEF: 35-40% G1DD  Clinical Course:  Past Medical History:  Diagnosis Date   Alcohol use disorder, severe, dependence (HCC) 11/06/2014   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 Stopped in 2010   Tobacco use disorder 10/07/2014     Social  History   Socioeconomic History   Marital status: Single    Spouse name: Not on file   Number of children: Not on file   Years of education: 12   Highest education level: Not on file  Occupational History   Occupation: Not working  Tobacco Use   Smoking status: Former    Current packs/day: 0.00    Types: Cigarettes    Quit date: 02/18/2017    Years since quitting: 6.6   Smokeless tobacco: Never  Vaping Use   Vaping status: Never Used  Substance and Sexual Activity   Alcohol use: Yes    Comment: Beer- 40 oz. every day.   Drug use: Not Currently    Types: Cocaine, Marijuana    Comment: Sometimes.   Sexual activity: Not on file  Other Topics Concern   Not on file  Social History Narrative   Lives in [place] for last 10 years with "friend"  [name]      Is not married and does not have kids.   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   Social Determinants of Health   Financial Resource Strain: High Risk (10/02/2023)   Overall Financial Resource Strain (CARDIA)    Difficulty of Paying Living Expenses: Very hard  Food Insecurity: Food Insecurity Present (10/01/2023)   Hunger Vital Sign    Worried About Running Out of Food in the Last Year: Often true    Ran Out of Food  in the Last Year: Often true  Transportation Needs: Unmet Transportation Needs (10/01/2023)   PRAPARE - Administrator, Civil Service (Medical): Yes    Lack of Transportation (Non-Medical): Yes  Physical Activity: Not on file  Stress: Not on file  Social Connections: Socially Isolated (10/11/2022)   Social Connection and Isolation Panel [NHANES]    Frequency of Communication with Friends and Family: Never    Frequency of Social Gatherings with Friends and Family: Never    Attends Religious Services: Never    Database administrator or Organizations: No    Attends Engineer, structural: Never    Marital Status:  Never married   Education Assessment and Provision:  Detailed education and instructions provided on heart failure disease management including the following:  Signs and symptoms of Heart Failure When to call the physician Importance of daily weights Low sodium diet Fluid restriction Medication management Anticipated future follow-up appointments  Patient education given on each of the above topics.  Patient acknowledges understanding via teach back method and acceptance of all instructions.  Education Materials:  "Living Better With Heart Failure" Booklet, HF zone tool, & Daily Weight Tracker Tool.  Patient has scale at home: no, currently homeless, will give to patient at appointment Patient has pill box at home: No, pill packs    High Risk Criteria for Readmission and/or Poor Patient Outcomes: Heart failure hospital admissions (last 6 months): 0  No Show rate: 22 % Difficult social situation: Yes, homeless, CSW involved for housing, clothes,  Demonstrates medication adherence: No Primary Language: English Literacy level: patient legally blind  Barriers of Care:   Homeless, unemployed Medication compliance Diet/ fluid restrictions, daily weights Substance abuse, ( alcohol, cocaine)   Considerations/Referrals:   Referral made to Heart Failure Pharmacist Stewardship: Yes Referral made to Heart Failure CSW/NCM TOC: Yes, housing, clothes, bus passes Referral made to Heart & Vascular TOC clinic: Yes, 10/15/2023 @ 12 noon.   Items for Follow-up on DC/TOC: SDOH needs (CSW involved) Diet/ fluid restrictions, daily weights Medication compliance Substance cessation ( alcohol, cocaine) Continued HF education   Rhae Hammock, BSN, RN Heart Failure Print production planner Chat Only

## 2023-10-02 NOTE — Progress Notes (Signed)
   Heart Failure Stewardship Pharmacist Progress Note   PCP: [name], MD PCP-Cardiologist: None    HPI:  62 yo M with PMH of CHF, polysubstance use, T2DM, HLD, HTN, glaucoma, and legal blindness.   Presented to the ED on 11/4 with shortness of breath. Denies chest pain or LE edema. Last reported use of cocaine on the weekend. He is off all of his medications. BNP elevated 207. CXR with multifocal R lung PNA. ECHO 11/4 with LVEF 35-40% (was 65-70% in 2018; 35-40% in 2016), global hypokinesis, mild concentric LVH, G2DD, RV normal, trivial MR.   Patient is homeless and plans to be discharged to the street. I assisted him with calling his friend, Clide Cliff, to see if he could stay with him. He was not given permission to stay with him. Patient is legally blind but can see partially.   Discharge HF Medications: Diuretic: furosemide 40 mg daily Beta Blocker: carvedilol 3.125 mg BID ACE/ARB/ARNI: losartan 25 mg daily SGLT2i: Jardiance 10 mg daily (combined with metformin)  Prior to admission HF Medications: None  Pertinent Lab Values: Serum creatinine 1.07, BUN 19, Potassium 3.8, Sodium 137, BNP 207.8, Magnesium 2.0, A1c 7.8   Vital Signs: Weight: 215 lbs (admission weight: 215 lbs) Blood pressure: 130/90s  Heart rate: 70s  I/O: incomplete  Medication Assistance / Insurance Benefits Check: Does the patient have prescription insurance?  Yes Type of insurance plan: Medicaid  Outpatient Pharmacy:  Prior to admission outpatient pharmacy: [facility] Is the patient willing to use [facility] TOC pharmacy at discharge? Yes Is the patient willing to transition their outpatient pharmacy to utilize a [facility] outpatient pharmacy?   No    Assessment: 1. Acute on chronic systolic CHF (LVEF 35-40%), due to presumed NICM in the setting of medication noncompliance, alcohol, and cocaine use. NYHA class II symptoms. - Continue furosemide 40 mg daily at discharge - Continue carvedilol 3.125 mg  BID - Continue losartan 25 mg daily, may be able to transition to [facility] as outpatient, concern for compliance - Consider starting spironolactone 25 mg daily at follow up - Continue Jardiance 10 mg daily at discharge, pharmacy does not carry his empagliflozin/metformin combo pill. Will need to send separate scripts at his discharge.    Plan: 1) Medication changes recommended at this time: - Transition to [facility] +/- add spironolactone at follow up  2) Patient assistance: - Has [place] Medicaid - Filled 90 day supply of medications with TOC pharmacy - Filled 7 day pill box to assist him with compliance - Made sure patient is aware of how many medications that he should be taking in the morning and in the evening and had the patient feel inside both AM / PM slots to familiarize himself with medications.   3)  Education  - Patient has been educated on current HF medications and potential additions to HF medication regimen - Patient verbalizes understanding that over the next few months, these medication doses may change and more medications may be added to optimize HF regimen - Patient has been educated on basic disease state pathophysiology and goals of therapy   Sharen Hones, PharmD, BCPS Heart Failure Stewardship Pharmacist Phone [phone]

## 2023-10-05 ENCOUNTER — Telehealth: Payer: Self-pay | Admitting: *Deleted

## 2023-10-05 NOTE — Telephone Encounter (Signed)
Call from patient states Glucose was 403 this morning.  Recently discharged from the hospital.   Patient states did eat a Mango today.  Wants to know what he needs to do now.  Is not sure if he is on fluid restrictions.  Is with a friend  whose phone number  is [phone].

## 2023-10-09 ENCOUNTER — Encounter: Payer: Self-pay | Admitting: Podiatry

## 2023-10-09 ENCOUNTER — Ambulatory Visit: Payer: Self-pay

## 2023-10-09 ENCOUNTER — Ambulatory Visit (INDEPENDENT_AMBULATORY_CARE_PROVIDER_SITE_OTHER): Payer: MEDICAID | Admitting: Podiatry

## 2023-10-09 DIAGNOSIS — M79676 Pain in unspecified toe(s): Secondary | ICD-10-CM | POA: Diagnosis not present

## 2023-10-09 DIAGNOSIS — B351 Tinea unguium: Secondary | ICD-10-CM | POA: Diagnosis not present

## 2023-10-09 DIAGNOSIS — E1142 Type 2 diabetes mellitus with diabetic polyneuropathy: Secondary | ICD-10-CM

## 2023-10-09 NOTE — Progress Notes (Signed)
This patient returns to my office for at risk foot care.  This patient requires this care by a professional since this patient will be at risk due to having diabetic neuropathy.  This patient is unable to cut nails himself since the patient cannot reach his nails.These nails are painful walking and wearing shoes.  This patient presents for at risk foot care today.  General Appearance  Alert, conversant and in no acute stress.  Vascular  Dorsalis pedis and posterior tibial  pulses are palpable  bilaterally.  Capillary return is within normal limits  bilaterally. Temperature is within normal limits  bilaterally.  Neurologic  Senn-Weinstein monofilament wire test within normal limits  bilaterally. Muscle power within normal limits bilaterally.  Nails Thick disfigured discolored nails with subungual debris  from hallux to fifth toes bilaterally. No evidence of bacterial infection or drainage bilaterally.  Orthopedic  No limitations of motion  feet .  No crepitus or effusions noted.  No bony pathology or digital deformities noted.  Skin  normotropic skin with no porokeratosis noted bilaterally.  No signs of infections or ulcers noted.     Onychomycosis  Pain in right toes  Pain in left toes  Consent was obtained for treatment procedures.   Mechanical debridement of nails 1-5  bilaterally performed with a nail nipper.  Filed with dremel without incident.    Return office visit   3 months                   Told patient to return for periodic foot care and evaluation due to potential at risk complications.   Richad Ramsay DPM   

## 2023-10-09 NOTE — Progress Notes (Signed)
Pt has been seen by [facility] in past. Notice of Privacy Practices reviewed and consent given. Hard copy of health screening form scanned into media tab.

## 2023-10-09 NOTE — Progress Notes (Signed)
Pt fist visit with [facility]. Pt hospitalized x 3 days for "walking pneumonia and fluid on my lungs"   Today BS clear bilateral, heart sounds normals, no peripheral edema.   Discussed pt medications. He will bring them next visit for review.   Education: medication, follow up information with PCP.

## 2023-10-09 NOTE — Patient Instructions (Signed)
Follow up with PCP

## 2023-10-15 ENCOUNTER — Encounter (HOSPITAL_COMMUNITY): Payer: Self-pay

## 2023-10-15 ENCOUNTER — Ambulatory Visit (HOSPITAL_COMMUNITY)
Admit: 2023-10-15 | Discharge: 2023-10-15 | Disposition: A | Discharge status: Home / Self Care | Payer: MEDICAID | Source: Ambulatory Visit | Attending: Cardiology | Admitting: Cardiology

## 2023-10-15 VITALS — BP 128/78 | HR 74 | Wt 207.8 lb

## 2023-10-15 DIAGNOSIS — E785 Hyperlipidemia, unspecified: Secondary | ICD-10-CM | POA: Diagnosis not present

## 2023-10-15 DIAGNOSIS — E119 Type 2 diabetes mellitus without complications: Secondary | ICD-10-CM | POA: Diagnosis not present

## 2023-10-15 DIAGNOSIS — Z79899 Other long term (current) drug therapy: Secondary | ICD-10-CM | POA: Insufficient documentation

## 2023-10-15 DIAGNOSIS — F1990 Other psychoactive substance use, unspecified, uncomplicated: Secondary | ICD-10-CM | POA: Diagnosis not present

## 2023-10-15 DIAGNOSIS — F1490 Cocaine use, unspecified, uncomplicated: Secondary | ICD-10-CM | POA: Diagnosis not present

## 2023-10-15 DIAGNOSIS — I110 Hypertensive heart disease with heart failure: Secondary | ICD-10-CM | POA: Insufficient documentation

## 2023-10-15 DIAGNOSIS — I5042 Chronic combined systolic (congestive) and diastolic (congestive) heart failure: Secondary | ICD-10-CM

## 2023-10-15 DIAGNOSIS — Z7984 Long term (current) use of oral hypoglycemic drugs: Secondary | ICD-10-CM | POA: Insufficient documentation

## 2023-10-15 LAB — BASIC METABOLIC PANEL
Anion gap: 10 (ref 5–15)
BUN: 19 mg/dL (ref 6–20)
CO2: 26 mmol/L (ref 22–32)
Calcium: 9 mg/dL (ref 8.9–10.3)
Chloride: 100 mmol/L (ref 98–111)
Creatinine, Ser: 1.55 mg/dL — ABNORMAL HIGH (ref 0.61–1.24)
GFR, Estimated: 51 mL/min — ABNORMAL LOW (ref >60)
Glucose, Bld: 137 mg/dL — ABNORMAL HIGH (ref 70–99)
Potassium: 3.3 mmol/L — ABNORMAL LOW (ref 3.5–5.1)
Sodium: 136 mmol/L (ref 135–145)

## 2023-10-15 LAB — BRAIN NATRIURETIC PEPTIDE: B Natriuretic Peptide: 63.2 pg/mL (ref 0.0–100.0)

## 2023-10-15 LAB — ETHANOL: Alcohol, Ethyl (B): <10 mg/dL (ref <10)

## 2023-10-15 MED ORDER — SPIRONOLACTONE 25 MG PO TABS: 12.5000 mg | ORAL_TABLET | Freq: Every day | ORAL | 3 refills | Status: DC

## 2023-10-15 MED ORDER — FUROSEMIDE 40 MG PO TABS: 40.0000 mg | ORAL_TABLET | Freq: Two times a day (BID) | ORAL | 3 refills | Status: DC

## 2023-10-15 NOTE — Patient Instructions (Signed)
START spironolactone 12.5 mg ( 1/2 tablet) daily  INCREASE LASIX to 40mg  Twice daily   Labs done today, your results will be available in MyChart, we will contact you for abnormal readings.  Your physician recommends that you schedule a follow-up appointment in: as xcheduled  If you have any questions or concerns before your next appointment please send [place] a message through [place] or call our office at [phone].    TO LEAVE A MESSAGE FOR THE NURSE SELECT OPTION 2, PLEASE LEAVE A MESSAGE INCLUDING: YOUR NAME DATE OF BIRTH CALL BACK NUMBER REASON FOR CALL**this is important as we prioritize the call backs  YOU WILL RECEIVE A CALL BACK THE SAME DAY AS LONG AS YOU CALL BEFORE 4:00 PM\  At the [facility], you and your health needs are our priority. As part of our continuing mission to provide you with exceptional heart care, we have created designated Provider Care Teams. These Care Teams include your primary Cardiologist (physician) and Advanced Practice Providers (APPs- Physician Assistants and Nurse Practitioners) who all work together to provide you with the care you need, when you need it.   You may see any of the following providers on your designated Care Team at your next follow up: Dr Arvilla Meres Dr Marca Ancona Dr. Dorthula Nettles Dr. Clearnce Hasten Amy Filbert Schilder, NP Robbie Lis, [place] [facility] [place], [place] Brynda Peon, NP [place] Lee, NP Karle Plumber, PharmD   Please be sure to bring in all your medications bottles to every appointment.    Thank you for choosing [facility]

## 2023-10-15 NOTE — Progress Notes (Signed)
HEART & VASCULAR TRANSITION OF CARE CONSULT NOTE     Referring Physician: Dr. Carolan Clines  Primary Care: [name], MD Primary Cardiologist: None   HPI: Referred to clinic by Dr. Carolan Clines for heart failure consultation.   62 y/o male w/ h/o of systolic hear failure (prior echo in 2016 EF 35-40%>>improved to 65-70% no echo in 2018), polysubstance use w/ h/o cocaine and ETOH abuse, T2DM, HLD, HTN, glaucoma, and legal blindness.   Recently presented to the ED earlier this month w/ SOB and diagnosed w/ acute CHF. This was in the setting of recent cocaine use and medication noncompliance. Had been off all his medications. BNP was 207 in the ED. CXR showed pulmonary vascular congestion. USD + for cocaine. Echo was done and showed drop in EF back down to 35-40% w/ mild global HK, mild concentric LVH, G2DD, trivial MR and normal RV. He denied CP. His acute CHF was presumed to be NICM in the setting of medication noncompliance, alcohol, and cocaine use. He was diuresed w/ IV Lasix and placed on oral GDMT. Referred to TOC at d/c.   He presents today for f/u. Says he "feels great", "like a million bucks". He denies any further drug use but looks/acting high. Reports full med compliance. BP well controlled at 128/78. Denies resting and exertional dyspnea but ReDs is elevated at 41%. Walks most places and uses the bus. Caught the bus for today's  appointment. Now has housing. Living in an apartment. Says he will have this arrangement for the next 5 months. For the past 2 wks, he has been going to the [facility] unit to get help w/ meds/ get pill boxes filled. He plans to go again tomorrow for assistance.     Cardiac Testing  2D Echo 11/24  IMPRESSIONS     1. Left ventricular ejection fraction, by estimation, is 35 to 40%. The  left ventricle has moderately decreased function. The left ventricle  demonstrates global hypokinesis. There is mild concentric left ventricular   hypertrophy. Left ventricular  diastolic parameters are consistent with Grade II diastolic dysfunction  (pseudonormalization).   2. Right ventricular systolic function is normal. The right ventricular  size is normal.   3. The mitral valve is normal in structure. Trivial mitral valve  regurgitation. No evidence of mitral stenosis.   4. The aortic valve is tricuspid. There is mild calcification of the  aortic valve. Aortic valve regurgitation is not visualized. Aortic valve  sclerosis/calcification is present, without any evidence of aortic  stenosis.   5. The inferior vena cava is normal in size with greater than 50%  respiratory variability, suggesting right atrial pressure of 3 mmHg.   Review of Systems: [y] = yes, [ ]  = no   General: Weight gain [ ] ; Weight loss [ ] ; Anorexia [ ] ; Fatigue [ ] ; Fever [ ] ; Chills [ ] ; Weakness [ ]   Cardiac: Chest pain/pressure [ ] ; Resting SOB [ ] ; Exertional SOB [ ] ; Orthopnea [ ] ; Pedal Edema [ ] ; Palpitations [ ] ; Syncope [ ] ; Presyncope [ ] ; Paroxysmal nocturnal dyspnea[ ]   Pulmonary: Cough [ ] ; Wheezing[ ] ; Hemoptysis[ ] ; Sputum [ ] ; Snoring [ ]   GI: Vomiting[ ] ; Dysphagia[ ] ; Melena[ ] ; Hematochezia [ ] ; Heartburn[ ] ; Abdominal pain [ ] ; Constipation [ ] ; Diarrhea [ ] ; BRBPR [ ]   GU: Hematuria[ ] ; Dysuria [ ] ; Nocturia[ ]   Vascular: Pain in legs with walking [ ] ; Pain in feet with lying flat [ ] ;  Non-healing sores [ ] ; Stroke [ ] ; TIA [ ] ; Slurred speech [ ] ;  Neuro: Headaches[ ] ; Vertigo[ ] ; Seizures[ ] ; Paresthesias[ ] ;Blurred vision [ ] ; Diplopia [ ] ; Vision changes [ ]   Ortho/Skin: Arthritis [ ] ; Joint pain [ ] ; Muscle pain [ ] ; Joint swelling [ ] ; Back Pain [ ] ; Rash [ ]   Psych: Depression[ ] ; Anxiety[ ]   Heme: Bleeding problems [ ] ; Clotting disorders [ ] ; Anemia [ ]   Endocrine: Diabetes [ ] ; Thyroid dysfunction[ ]    Past Medical History:  Diagnosis Date   Alcohol use disorder, severe, dependence (HCC) 11/06/2014   Blind    blind in  left eye, very limited vision right eye    Cannabis use disorder 08/24/2022   Cocaine use disorder, severe, dependence (HCC)    Diabetes mellitus    High cholesterol    Housing instability 08/24/2022   Hypertension    Substance abuse (HCC)    cocaine. Stopped in 2010   Tobacco use disorder 10/07/2014    Current Outpatient Medications  Medication Sig Dispense Refill   aspirin EC 81 MG tablet Take 1 tablet (81 mg total) by mouth daily. Swallow whole. 90 tablet 3   atorvastatin (LIPITOR) 80 MG tablet Take 1 tablet (80 mg total) by mouth daily at 6 PM. 90 tablet 3   brimonidine (ALPHAGAN) 0.15 % ophthalmic solution Place 1 drop into both eyes 2 (two) times daily. 5 mL 6   carvedilol (COREG) 3.125 MG tablet Take 1 tablet (3.125 mg total) by mouth 2 (two) times daily with a meal. 180 tablet 3   empagliflozin (JARDIANCE) 10 MG TABS tablet Take 1 tablet (10 mg total) by mouth daily before breakfast. 90 tablet 3   fluorometholone (FML) 0.1 % ophthalmic suspension Place 1 drop into both eyes daily. 5 mL 2   folic acid (FOLVITE) 1 MG tablet Take 1 tablet (1 mg total) by mouth daily. 30 tablet 0   losartan (COZAAR) 25 MG tablet Take 1 tablet (25 mg total) by mouth daily. 90 tablet 3   metFORMIN (GLUCOPHAGE) 500 MG tablet Take 1 tablet (500 mg total) by mouth 2 (two) times daily with a meal. 180 tablet 3   Multiple Vitamin (MULTIVITAMIN WITH MINERALS) TABS tablet Take 1 tablet by mouth daily. 90 tablet 0   RHOPRESSA 0.02 % SOLN Apply 1 drop to eye at bedtime. Use in the evening 2.5 mL 3   ROCKLATAN 0.02-0.005 % SOLN Place 1 drop into the right eye at bedtime. 2.5 mL 0   SIMBRINZA 1-0.2 % SUSP Place 1 drop into the right eye 2 (two) times daily. 8 mL 3   spironolactone (ALDACTONE) 25 MG tablet Take 0.5 tablets (12.5 mg total) by mouth daily. 45 tablet 3   timolol (TIMOPTIC) 0.5 % ophthalmic solution Place 1 drop into the right eye 2 (two) times daily. 10 mL 2   furosemide (LASIX) 40 MG tablet Take 1  tablet (40 mg total) by mouth 2 (two) times daily. 60 tablet 3   No current facility-administered medications for this encounter.    No Known Allergies    Social History   Socioeconomic History   Marital status: Single    Spouse name: Not on file   Number of children: Not on file   Years of education: 12   Highest education level: Not on file  Occupational History   Occupation: Not working  Tobacco Use   Smoking status: Former    Current packs/day: 0.00    Types:  Cigarettes    Quit date: 02/18/2017    Years since quitting: 6.6   Smokeless tobacco: Never  Vaping Use   Vaping status: Never Used  Substance and Sexual Activity   Alcohol use: Yes    Comment: Beer- 40 oz. every day.   Drug use: Not Currently    Types: Cocaine, Marijuana    Comment: Sometimes.   Sexual activity: Not on file  Other Topics Concern   Not on file  Social History Narrative   Lives in [place] for last 10 years with "friend"  [name]      Is not married and does not have kids.   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   Social Determinants of Health   Financial Resource Strain: High Risk (10/02/2023)   Overall Financial Resource Strain (CARDIA)    Difficulty of Paying Living Expenses: Very hard  Food Insecurity: Food Insecurity Present (10/01/2023)   Hunger Vital Sign    Worried About Programme researcher, broadcasting/film/video in the Last Year: Often true    Ran Out of Food in the Last Year: Often true  Transportation Needs: Unmet Transportation Needs (10/01/2023)   PRAPARE - Administrator, Civil Service (Medical): Yes    Lack of Transportation (Non-Medical): Yes  Physical Activity: Not on file  Stress: Not on file  Social Connections: Socially Isolated (10/11/2022)   Social Connection and Isolation Panel [NHANES]    Frequency of Communication with Friends and Family: Never    Frequency of Social Gatherings with Friends and  Family: Never    Attends Religious Services: Never    Database administrator or Organizations: No    Attends Banker Meetings: Never    Marital Status: Never married  Intimate Partner Violence: Not At Risk (10/01/2023)   Humiliation, Afraid, Rape, and Kick questionnaire    Fear of Current or Ex-Partner: No    Emotionally Abused: No    Physically Abused: No    Sexually Abused: No      Family History  Problem Relation Age of Onset   Coronary artery disease Mother 59       died of MI   Cancer Father 39       Some GI cancer. Not sure if it was Colon Cancer or not.   Hypertension Father    Diabetes Maternal Grandmother     Vitals:   10/15/23 1249  BP: 128/78  Pulse: 74  SpO2: 97%  Weight: 94.3 kg (207 lb 12.8 oz)    PHYSICAL EXAM: ReDs 41%  General:  Well appearing. No respiratory difficulty HEENT: normal Neck: supple.  JVD 8 cm. Carotids 2+ bilat; no bruits. No lymphadenopathy or thryomegaly appreciated. Cor: PMI nondisplaced. Regular rate & rhythm. No rubs, gallops or murmurs. Lungs: clear Abdomen: soft, nontender, obese, mildly distended. No hepatosplenomegaly. No bruits or masses. Good bowel sounds. Extremities: no cyanosis, clubbing, rash, edema Neuro: alert & oriented x 3, cranial nerves grossly intact. moves all 4 extremities w/o difficulty. Affect pleasant.  ECG: not performed    ASSESSMENT & PLAN:  1. Chronic Combined  Systolic and Diastolic Heart Failure - Echo 8119 EF 35-40% - Echo 2018 EF improved to 65-70% - Echo 11/24 EF 35-40%, GIIDD, RV normal. Denies ischemic CP. CM felt likely 2/2 polysubstance use, HTN and med noncompliance  - plan is to get back on and titrate up GDMT - NYHA Class I-II,  though appears mildly volume up on exam. ReDs also elevated at 41%.  - add spiro 12.5 mg daily  - increase Lasix to 40 mg bid  - continue Losartan 25 mg daily. Would like to get on Entresto but will need to check cost (Medicaid)  - continue coreg  3.125 mg bid - imperative to refrain from cocaine and ETOH, reiterated this again today - check BMP/BNP today    2. HTN - improved w/ GDMT and better compliance - GDMT titration per above - check BMP today   3. H/o Poor Compliance - now utilizing the [facility] mobile unit for med assistance. Thy fill his pill box every Tuesday. He was encouraged to continue to use this service   4. H/o Polysubstance Use - recent hospital admit w/ + UDS for cocaine - he denies any further uses but looks/acting high, though this just may be his personality. This is my first time meeting him. Will check serum drug screen and ETOH screen - reiterated the effects of drug use on cardiovascular health and encouraged to refrain from use     Referred to HFSW (PCP, Medications, Transportation, ETOH Abuse, Drug Abuse, Insurance, Financial ):No  Refer to Pharmacy: Yes  Refer to Home Health:  No Refer to [facility]: No  Refer to General Cardiology: Yes   Follow up in [facility] clinic in 2-3 wks to help w/ further med titration, then onto cardiology for further management.  Robbie Lis, PA-C 10/15/2023

## 2023-10-16 ENCOUNTER — Ambulatory Visit: Payer: Self-pay | Admitting: Nurse Practitioner

## 2023-10-16 ENCOUNTER — Encounter: Payer: Self-pay | Admitting: Nurse Practitioner

## 2023-10-16 VITALS — BP 113/70 | HR 73 | Temp 97.8°F | Resp 16 | Ht 69.0 in | Wt 208.3 lb

## 2023-10-16 DIAGNOSIS — E1136 Type 2 diabetes mellitus with diabetic cataract: Secondary | ICD-10-CM

## 2023-10-16 LAB — POCT GLYCOSYLATED HEMOGLOBIN (HGB A1C): Hemoglobin A1C: 11 % — AB (ref 4.0–5.6)

## 2023-10-16 NOTE — Progress Notes (Signed)
Pt seen on [facility]. VS and health screening complete. Reviewed current changes in medications and reviewed HTN and diabetes education. Pt has f/u 10/29/23 with PCP

## 2023-10-16 NOTE — Progress Notes (Signed)
Pt notified of result.  Pt was without medication for 1 month prior to recent hospitalization for pneumonia. Advised to recheck in 3 months and continue to take currently ordered medications.

## 2023-10-17 ENCOUNTER — Telehealth: Payer: Self-pay | Admitting: *Deleted

## 2023-10-17 ENCOUNTER — Encounter: Payer: MEDICAID | Admitting: Student

## 2023-10-17 DIAGNOSIS — Z59819 Housing instability, housed unspecified: Secondary | ICD-10-CM

## 2023-10-17 DIAGNOSIS — I5042 Chronic combined systolic (congestive) and diastolic (congestive) heart failure: Secondary | ICD-10-CM

## 2023-10-17 DIAGNOSIS — E1136 Type 2 diabetes mellitus with diabetic cataract: Secondary | ICD-10-CM

## 2023-10-17 NOTE — Telephone Encounter (Signed)
Call from patient who spoke with Thurston Hole. CMA is unable to manage his meds due to his poor vision. Patient asked  pharmacy to help but was denied.  Patient has an appointment to come in on tomorrow morning to do assistance with putting meds in a med box.  Will also need a referral to [facility] for Nurse to go out and assist with meds as well as a referral to [facility] for assistance with SW issues and Mental Health.if possible .

## 2023-10-18 ENCOUNTER — Ambulatory Visit: Payer: MEDICAID

## 2023-10-18 ENCOUNTER — Other Ambulatory Visit: Payer: Self-pay

## 2023-10-18 LAB — DRUG SCREEN 10 W/CONF, SERUM
Amphetamines, IA: NEGATIVE ng/mL
Barbiturates, IA: NEGATIVE ug/mL
Benzodiazepines, IA: NEGATIVE ng/mL
Cocaine & Metabolite, IA: NEGATIVE ng/mL
Methadone, IA: NEGATIVE ng/mL
Opiates, IA: NEGATIVE ng/mL
Oxycodones, IA: NEGATIVE ng/mL
Phencyclidine, IA: NEGATIVE ng/mL
Propoxyphene, IA: NEGATIVE ng/mL
THC(Marijuana) Metabolite, IA: NEGATIVE ng/mL

## 2023-10-23 ENCOUNTER — Encounter: Payer: MEDICAID | Admitting: Student

## 2023-10-23 ENCOUNTER — Other Ambulatory Visit (HOSPITAL_COMMUNITY): Payer: Self-pay

## 2023-10-23 ENCOUNTER — Encounter: Payer: Self-pay | Admitting: Student

## 2023-10-23 ENCOUNTER — Other Ambulatory Visit: Payer: Self-pay

## 2023-10-23 ENCOUNTER — Ambulatory Visit: Payer: MEDICAID | Admitting: Student

## 2023-10-23 VITALS — BP 113/66 | HR 69 | Temp 98.1°F | Ht 69.0 in | Wt 211.1 lb

## 2023-10-23 DIAGNOSIS — H548 Legal blindness, as defined in USA: Secondary | ICD-10-CM | POA: Insufficient documentation

## 2023-10-23 DIAGNOSIS — H547 Unspecified visual loss: Secondary | ICD-10-CM | POA: Diagnosis not present

## 2023-10-23 DIAGNOSIS — Z79899 Other long term (current) drug therapy: Secondary | ICD-10-CM | POA: Diagnosis not present

## 2023-10-23 DIAGNOSIS — E1136 Type 2 diabetes mellitus with diabetic cataract: Secondary | ICD-10-CM

## 2023-10-23 DIAGNOSIS — I5042 Chronic combined systolic (congestive) and diastolic (congestive) heart failure: Secondary | ICD-10-CM

## 2023-10-23 DIAGNOSIS — Z7984 Long term (current) use of oral hypoglycemic drugs: Secondary | ICD-10-CM

## 2023-10-23 LAB — GLUCOSE, CAPILLARY: Glucose-Capillary: 168 mg/dL — ABNORMAL HIGH (ref 70–99)

## 2023-10-23 MED ORDER — SYNJARDY 5-1000 MG PO TABS: 1.0000 | ORAL_TABLET | Freq: Two times a day (BID) | ORAL | 3 refills | Status: DC

## 2023-10-23 NOTE — Assessment & Plan Note (Signed)
Recently seen in heart and vascular for HF follow up on 09/2023. EF 35-40%>>improved to 65-70% no echo in 2018-> 09/2023 EF back down to 35-40% w/ mild global HK, mild concentric LVH, G2DD, trivial MR and normal RV after medication nonadherence in setting of difficulty accessing medications as he is visually impaired and houseing is unstable. Patient continues to follow up at [facility] clinic. No volume overload on exam today.  -Continue with GDMT

## 2023-10-23 NOTE — Patient Instructions (Addendum)
Thank you, Mr.Brady Church for allowing [place] to provide your care today. Today we discussed   Diabetes Stop taking the metformin and empagliflozin only pills Start taking Synjardy 03-999 mg in the AM and in the PM  Assistance I will put in referrals for medication and pharmacy assistance  Please follow-up in: 6 weeks for diabetes    We look forward to seeing you next time. Please call our clinic at [phone] if you have any questions or concerns. The best time to call is Monday-Friday from 9am-4pm, but there is someone available 24/7. If after hours or the weekend, call the main hospital number and ask for the Internal Medicine Resident On-Call. If you need medication refills, please notify your pharmacy one week in advance and they will send [place] a request.   Thank you for letting us take part in your care. Wishing you the best!  Morene Crocker, MD 10/23/2023, 10:34 AM [facility]

## 2023-10-23 NOTE — Assessment & Plan Note (Addendum)
Uncontrolled diabetes. Soon after discharge from hospital, patient had a lapse in medications as he received all medications from [facility] pharmacy however, was unable to read the labels on his medictions. It was not until this month that he started requesting help from the mobile clinic to fil his pill box weekly, on Tuesdays. This is also where patient checks with cBGs - which are usually around 136. Today patient denies catabolic symptoms.   His cBG in the office is 100. Physical exam within normal parameter. Reviewed recent lab work at cardiology office. This patient is at high risk for morbidity and mortality. He is in need of assistance at home with polypharmacy, Bp, and blood glucose checks. I called [facility] pharmacy to transfer all of his TOC medications to be filled there for the time being. Cannot set up mail pharmacy as he does not have a stable home. Will also refer to [facility] for pharmacy assistance as we could use help consolidating his medications and make it easier/sustainable for patient to obtain and adhere to medications. Plan -Continue Synjardy 03-999 mg BID -Return to clinic in the next 8 weeks for cBG and A1c monitoring -Patient is not amenable to injectables at this time. Introuduced the idea of weekly Mounjaro like pen system of GLP1 that he could take to the mobile clinic or through [facility] RN for administration -Last seen with podiatrist ~2 weeks ago

## 2023-10-23 NOTE — Assessment & Plan Note (Signed)
Continues to have difficulty with polypharmacy management and navigating bus system. Ambulated with cane

## 2023-10-23 NOTE — Progress Notes (Addendum)
Subjective:  CC: follow up  HPI:  Brady Church is a 62 y.o. male with a past medical history stated below and presents today for diabetes, medication, and assistance follow up. Please see problem based assessment and plan for additional details.  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 Stopped in 2010   Tobacco use disorder 10/07/2014    Current Outpatient Medications on File Prior to Visit  Medication Sig Dispense Refill   aspirin EC 81 MG tablet Take 1 tablet (81 mg total) by mouth daily. Swallow whole. 90 tablet 3   atorvastatin (LIPITOR) 80 MG tablet Take 1 tablet (80 mg total) by mouth daily at 6 PM. 90 tablet 3   brimonidine (ALPHAGAN) 0.15 % ophthalmic solution Place 1 drop into both eyes 2 (two) times daily. 5 mL 6   carvedilol (COREG) 3.125 MG tablet Take 1 tablet (3.125 mg total) by mouth 2 (two) times daily with a meal. 180 tablet 3   fluorometholone (FML) 0.1 % ophthalmic suspension Place 1 drop into both eyes daily. 5 mL 2   folic acid (FOLVITE) 1 MG tablet Take 1 tablet (1 mg total) by mouth daily. 30 tablet 0   furosemide (LASIX) 40 MG tablet Take 1 tablet (40 mg total) by mouth 2 (two) times daily. 60 tablet 3   losartan (COZAAR) 25 MG tablet Take 1 tablet (25 mg total) by mouth daily. 90 tablet 3   Multiple Vitamin (MULTIVITAMIN WITH MINERALS) TABS tablet Take 1 tablet by mouth daily. 90 tablet 0   RHOPRESSA 0.02 % SOLN Apply 1 drop to eye at bedtime. Use in the evening 2.5 mL 3   ROCKLATAN 0.02-0.005 % SOLN Place 1 drop into the right eye at bedtime. 2.5 mL 0   SIMBRINZA 1-0.2 % SUSP Place 1 drop into the right eye 2 (two) times  daily. 8 mL 3   spironolactone (ALDACTONE) 25 MG tablet Take 0.5 tablets (12.5 mg total) by mouth daily. 45 tablet 3   timolol (TIMOPTIC) 0.5 % ophthalmic solution Place 1 drop into the right eye 2 (two) times daily. 10 mL 2   No current facility-administered medications on file prior to visit.    Family History  Problem Relation Age of Onset   Coronary artery disease Mother 2       died of MI   Cancer Father 14       Some GI cancer. Not sure if it was Colon Cancer or not.   Hypertension Father    Diabetes Maternal Grandmother     Social History   Socioeconomic History   Marital status: Single    Spouse name: Not on file   Number of children: Not on file   Years of education: 12   Highest education level: Not on file  Occupational History   Occupation: Not working  Tobacco Use   Smoking status: Former    Current packs/day: 0.00    Types: Cigarettes    Quit date: 02/18/2017    Years since quitting: 6.6   Smokeless tobacco: Never  Vaping Use   Vaping status: Never Used  Substance and Sexual Activity   Alcohol use: Yes    Comment: Beer- 40 oz. every day.   Drug use: Not Currently    Types: Cocaine, Marijuana    Comment: Sometimes.   Sexual activity: Not on file  Other Topics Concern   Not on file  Social History Narrative   Lives in [place] for last 10 years with "friend"  Brady Church      Is not married and does not have kids.   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   Social Determinants of Health   Financial Resource Strain: High Risk (10/02/2023)   Overall Financial Resource Strain (CARDIA)    Difficulty of Paying Living Expenses: Very hard  Food Insecurity: Food Insecurity Present (10/01/2023)   Hunger Vital Sign    Worried About Programme researcher, broadcasting/film/video in the Last Year: Often true    Ran Out of Food in the Last Year: Often true  Transportation Needs: Unmet Transportation Needs  (10/01/2023)   PRAPARE - Administrator, Civil Service (Medical): Yes    Lack of Transportation (Non-Medical): Yes  Physical Activity: Not on file  Stress: Not on file  Social Connections: Socially Isolated (10/11/2022)   Social Connection and Isolation Panel [NHANES]    Frequency of Communication with Friends and Family: Never    Frequency of Social Gatherings with Friends and Family: Never    Attends Religious Services: Never    Database administrator or Organizations: No    Attends Banker Meetings: Never    Marital Status: Never married  Intimate Partner Violence: Not At Risk (10/01/2023)   Humiliation, Afraid, Rape, and Kick questionnaire    Fear of Current or Ex-Partner: No    Emotionally Abused: No    Physically Abused: No    Sexually Abused: No    Review of Systems: ROS negative except for what is noted on the assessment and plan.  Objective:   Vitals:   10/23/23 0954  BP: 113/66  Pulse: 69  Temp: 98.1 F (36.7 C)  TempSrc: Oral  SpO2: 99%  Weight: 211 lb 1.6 oz (95.8 kg)  Height: 5\' 9"  (1.753 m)    Physical Exam: Constitutional: well-appearing visually impaired man, in no acute distress HENT: normocephalic atraumatic, mucous membranes moist Eyes: conjunctiva non-erythematous Neck: supple, no JVP Cardiovascular: regular rate and rhythm, no m/r/g Pulmonary/Chest: normal work of breathing on room air, lungs clear to auscultation bilaterally Abdominal: soft, non-tender, non-distended MSK: normal bulk and tone Neurological: alert & oriented x 3, mobility impaired  Skin: warm and dry Psych: Normal mood and affect        10/23/2023    9:57 AM  Depression screen PHQ 2/9  Decreased Interest 0  Down, Depressed, Hopeless 0  PHQ - 2 Score 0        No data to display           Assessment & Plan:   Chronic combined systolic and diastolic congestive heart failure (HCC) Recently seen in heart and vascular for HF follow up on  09/2023. EF 35-40%>>improved to 65-70% no echo in 2018-> 09/2023 EF back down to 35-40% w/ mild global HK, mild concentric LVH, G2DD, trivial MR and normal RV after medication nonadherence in setting of difficulty accessing medications as he is visually impaired and houseing is unstable. Patient continues to follow up at [facility] clinic. No volume overload on exam  today.  -Continue with GDMT  Diabetes mellitus with diabetic cataract (HCC) Uncontrolled diabetes. Soon after discharge from hospital, patient had a lapse in medications as he received all medications from [facility] pharmacy however, was unable to read the labels on his medictions. It was not until this month that he started requesting help from the mobile clinic to fil his pill box weekly, on Tuesdays. This is also where patient checks with cBGs - which are usually around 136. Today patient denies catabolic symptoms.   His cBG in the office is 100. Physical exam within normal parameter. Reviewed recent lab work at cardiology office. This patient is at high risk for morbidity and mortality. He is in need of assistance at home with polypharmacy, Bp, and blood glucose checks. I called [facility] pharmacy to transfer all of his TOC medications to be filled there for the time being. Cannot set up mail pharmacy as he does not have a stable home. Will also refer to [facility] for pharmacy assistance as we could use help consolidating his medications and make it easier/sustainable for patient to obtain and adhere to medications. Plan -Continue Synjardy 03-999 mg BID -Return to clinic in the next 8 weeks for cBG and A1c monitoring -Patient is not amenable to injectables at this time. Introuduced the idea of weekly Mounjaro like pen system of GLP1 that he could take to the mobile clinic or through [facility] RN for administration -Last seen with podiatrist ~2 weeks ago   Visual impairment Continues to have difficulty with polypharmacy management and navigating bus system.  Ambulated with cane    Return in about 6 weeks (around 12/04/2023) for Diabetes.  Patient discussed with Dr. Koren Bound, MD [facility]  10/23/2023, 3:41 PM

## 2023-10-24 ENCOUNTER — Telehealth (HOSPITAL_COMMUNITY): Payer: Self-pay

## 2023-10-24 ENCOUNTER — Encounter (HOSPITAL_COMMUNITY): Payer: Self-pay

## 2023-10-24 NOTE — Progress Notes (Signed)
Internal Medicine Clinic Attending  Case discussed with the resident at the time of the visit.  We reviewed the resident's history and exam and pertinent patient test results.  I agree with the assessment, diagnosis, and plan of care documented in the resident's note.  

## 2023-10-24 NOTE — Telephone Encounter (Signed)
-----   Message from [place] sent at 10/15/2023  5:11 PM EST ----- Meds adjusted. Cleda Daub added which will help w/ hypokalemia. Please arrange for f/u BMP in 1 wk

## 2023-10-24 NOTE — Telephone Encounter (Signed)
Letter mailed to patient to call officve about lab results

## 2023-10-29 ENCOUNTER — Ambulatory Visit: Payer: MEDICAID | Admitting: Podiatry

## 2023-10-29 NOTE — Progress Notes (Incomplete)
HEART IMPACT TRANSITIONS OF CARE    PCP: Primary Cardiologist:  HPI: Mr Brady Church is a 62 y/o male w/ h/o of systolic hear failure (prior echo in 2016 EF 35-40%>>improved to 65-70% no echo in 2018), polysubstance use w/ h/o cocaine and ETOH abuse, T2DM, HLD, HTN, glaucoma, and legal blindness.    Admitted 10/01/23 with Acute HFrEF. This was in the setting of recent cocaine use and medication noncompliance. Had been off all his medications. BNP was 207 in the ED. CXR showed pulmonary vascular congestion. USD + for cocaine. Echo was done and showed drop in EF back down to 35-40% w/ mild global HK, mild concentric LVH, G2DD, trivial MR and normal RV. He denied CP. His acute CHF was presumed to be NICM in the setting of medication noncompliance, alcohol, and cocaine use. He was diuresed w/ IV Lasix and placed on oral GDMT.   He was seen in [facility] last month. Volume overloaded. Cleda Daub was added and lasix was increased.    Overall feeling fine. Denies SOB/PND/Orthopnea. Appetite ok. No fever or chills. Taking all medications. Living in an apartment.  He plans to go again tomorrow for assistance.   Echo  1.Left ventricular ejection fraction, by estimation, is 35 to 40%. The  left ventricle has moderately decreased function. The left ventricle  demonstrates global hypokinesis. There is mild concentric left ventricular  hypertrophy. Left ventricular  diastolic parameters are consistent with Grade II diastolic dysfunction  (pseudonormalization).   2. Right ventricular systolic function is normal. The right ventricular  size is normal.   3. The mitral valve is normal in structure. Trivial mitral valve  regurgitation. No evidence of mitral stenosis.   4. The aortic valve is tricuspid. There is mild calcification of the  aortic valve. Aortic valve regurgitation is not visualized. Aortic valve  sclerosis/calcification is present, without any evidence of aortic  stenosis.   ROS: All systems  negative except as listed in HPI, PMH and Problem List.  SH:  Social History   Socioeconomic History   Marital status: Single    Spouse name: Not on file   Number of children: Not on file   Years of education: 12   Highest education level: Not on file  Occupational History   Occupation: Not working  Tobacco Use   Smoking status: Former    Current packs/day: 0.00    Types: Cigarettes    Quit date: 02/18/2017    Years since quitting: 6.6   Smokeless tobacco: Never  Vaping Use   Vaping status: Never Used  Substance and Sexual Activity   Alcohol use: Yes    Comment: Beer- 40 oz. every day.   Drug use: Not Currently    Types: Cocaine, Marijuana    Comment: Sometimes.   Sexual activity: Not on file  Other Topics Concern   Not on file  Social History Narrative   Lives in [place] for last 10 years with "friend"  [name]      Is not married and does not have kids.   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   Social Determinants of Health   Financial Resource Strain: High Risk (10/02/2023)   Overall Financial Resource Strain (CARDIA)    Difficulty of Paying Living Expenses: Very hard  Food Insecurity: Food Insecurity Present (10/01/2023)   Hunger Vital Sign    Worried About Running Out of Food in the Last Year:  Often true    Ran Out of Food in the Last Year: Often true  Transportation Needs: Unmet Transportation Needs (10/01/2023)   PRAPARE - Administrator, Civil Service (Medical): Yes    Lack of Transportation (Non-Medical): Yes  Physical Activity: Not on file  Stress: Not on file  Social Connections: Socially Isolated (10/11/2022)   Social Connection and Isolation Panel [NHANES]    Frequency of Communication with Friends and Family: Never    Frequency of Social Gatherings with Friends and Family: Never    Attends Religious Services: Never    Database administrator or Organizations: No     Attends Banker Meetings: Never    Marital Status: Never married  Intimate Partner Violence: Not At Risk (10/01/2023)   Humiliation, Afraid, Rape, and Kick questionnaire    Fear of Current or Ex-Partner: No    Emotionally Abused: No    Physically Abused: No    Sexually Abused: No    FH:  Family History  Problem Relation Age of Onset   Coronary artery disease Mother 45       died of MI   Cancer Father 82       Some GI cancer. Not sure if it was Colon Cancer or not.   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 Stopped in 2010   Tobacco use disorder 10/07/2014    Current Outpatient Medications  Medication Sig Dispense Refill   aspirin EC 81 MG tablet Take 1 tablet (81 mg total) by mouth daily. Swallow whole. 90 tablet 3   atorvastatin (LIPITOR) 80 MG tablet Take 1 tablet (80 mg total) by mouth daily at 6 PM. 90 tablet 3   brimonidine (ALPHAGAN) 0.15 % ophthalmic solution Place 1 drop into both eyes 2 (two) times daily. 5 mL 6   carvedilol (COREG) 3.125 MG tablet Take 1 tablet (3.125 mg total) by mouth 2 (two) times daily with a meal. 180 tablet 3   Empagliflozin-metFORMIN HCl (SYNJARDY) 03-999 MG TABS Take 1 tablet by mouth 2 (two) times daily. 60 tablet 3   fluorometholone (FML) 0.1 % ophthalmic suspension Place 1 drop into both eyes daily. 5 mL 2   folic acid (FOLVITE) 1 MG tablet Take 1 tablet (1 mg total) by mouth daily. 30 tablet 0   furosemide (LASIX) 40 MG tablet Take 1 tablet (40 mg total) by mouth 2 (two) times daily. 60 tablet 3   losartan (COZAAR) 25 MG tablet Take 1  tablet (25 mg total) by mouth daily. 90 tablet 3   Multiple Vitamin (MULTIVITAMIN WITH MINERALS) TABS tablet Take 1 tablet by mouth daily. 90 tablet 0   RHOPRESSA 0.02 % SOLN Apply 1 drop to eye at bedtime. Use in the evening 2.5 mL 3   ROCKLATAN 0.02-0.005 % SOLN Place 1 drop into the right eye at bedtime. 2.5 mL 0   SIMBRINZA 1-0.2 % SUSP Place 1 drop into the right eye 2 (two) times daily. 8 mL 3   spironolactone (ALDACTONE) 25 MG tablet  Take 0.5 tablets (12.5 mg total) by mouth daily. 45 tablet 3   timolol (TIMOPTIC) 0.5 % ophthalmic solution Place 1 drop into the right eye 2 (two) times daily. 10 mL 2   No current facility-administered medications for this visit.    There were no vitals filed for this visit.  PHYSICAL EXAM:  General:  Well appearing. No resp difficulty HEENT: normal Neck: supple. JVP flat. Carotids 2+ bilaterally; no bruits. No lymphadenopathy or thryomegaly appreciated. Cor: PMI normal. Regular rate & rhythm. No rubs, gallops or murmurs. Lungs: clear Abdomen: soft, nontender, nondistended. No hepatosplenomegaly. No bruits or masses. Good bowel sounds. Extremities: no cyanosis, clubbing, rash, edema Neuro: alert & orientedx3, cranial nerves grossly intact. Moves all 4 extremities w/o difficulty. Affect pleasant.   ECG:   ASSESSMENT & PLAN:  1. Chronic Combined  Systolic and Diastolic Heart Failure - Echo 1324 EF 35-40% - Echo 2018 EF improved to 65-70% - Echo 11/24 EF 35-40%, GIIDD, RV normal. Denies ischemic CP. CM felt likely 2/2 polysubstance use, HTN and med noncompliance  - plan is to get back on and titrate up GDMT - NYHA  - Volume status - Increase spiro 25 mg daily.   - continue Losartan 25 mg daily. Would like to get on Entresto but will need to check cost (Medicaid)  - continue coreg 3.125 mg bid - imperative to refrain from cocaine and ETOH, reiterated this again today    2. HTN - improved w/ GDMT and better compliance - GDMT titration per  above    3. H/o Poor Compliance - now utilizing the [place] [facility] mobile unit for med assistance. Thy fill his pill box every Tuesday. He was encouraged to continue to use this service    4. H/o Polysubstance Use - recent hospital admit w/ + UDS for cocaine - he denies any further uses but looks/acting high, though this just may be his personality. This is my first time meeting him. Will check serum drug screen and ETOH screen - reiterated the effects of drug use on cardiovascular health and encouraged to refrain from use       Follow up

## 2023-10-30 ENCOUNTER — Other Ambulatory Visit (HOSPITAL_COMMUNITY): Payer: Self-pay

## 2023-11-01 ENCOUNTER — Telehealth: Payer: Self-pay

## 2023-11-01 ENCOUNTER — Inpatient Hospital Stay (HOSPITAL_COMMUNITY)
Admission: RE | Admit: 2023-11-01 | Discharge: 2023-11-01 | Disposition: A | Discharge status: Home / Self Care | Payer: MEDICAID | Source: Ambulatory Visit

## 2023-11-01 NOTE — Progress Notes (Unsigned)
   Care Guide Note  11/01/2023 Name: Dushawn Sark MRN: [id number] DOB: February 13, 1961  Referred by: [name], MD Reason for referral : Care Coordination (Outreach to schedule with Pharm d )   Akhilesh Sparr is a 62 y.o. year old male who is a primary care patient of [name], MD. Draden Rumley was referred to the pharmacist for assistance related to DM.    An unsuccessful telephone outreach was attempted today to contact the patient who was referred to the pharmacy team for assistance with medication management. Additional attempts will be made to contact the patient.   Penne Lash , RMA     [facility]  [facility], [facility] Guide  Direct Dial: [phone]  Website: [email or web address]

## 2023-11-05 NOTE — Progress Notes (Unsigned)
   Care Guide Note  11/05/2023 Name: Brady Church MRN: [id number] DOB: [birth year]  Referred by: [name], MD Reason for referral : Care Coordination (Outreach to schedule with Pharm d )   Brady Church is a 62 y.o. year old male who is a primary care patient of [name], MD. Brady Church was referred to the pharmacist for assistance related to DM.    An unsuccessful telephone outreach was attempted today to contact the patient who was referred to the pharmacy team for assistance with medication management. Additional attempts will be made to contact the patient.   Penne Lash , RMA     [facility]  [facility], [facility] Guide  Direct Dial: [phone]  Website: [email or web address]

## 2023-11-06 ENCOUNTER — Ambulatory Visit: Payer: Self-pay | Admitting: Nurse Practitioner

## 2023-11-06 NOTE — Progress Notes (Signed)
Pt brought home meds and medication dispenser filled with prescribed meds. Reviewed medication and meds needing refills. Pt has appt tomorrow with Dr. Brett Fairy. BP elevated. Pt reports high salt intake yesterday. Discussed healthy eating related to HTN. Lungs clear, no shortness of breath.

## 2023-11-06 NOTE — Progress Notes (Signed)
   Care Guide Note  11/06/2023 Name: Brady Church MRN: [id number] DOB: 01/13/1961  Referred by: [name], MD Reason for referral : Care Coordination (Outreach to schedule with Pharm d )   Trygg Hopf is a 62 y.o. year old male who is a primary care patient of [name], MD. Sabin Molton was referred to the pharmacist for assistance related to DM.    A third unsuccessful telephone outreach was attempted today to contact the patient who was referred to the pharmacy team for assistance with medication assistance. The Population Health team is pleased to engage with this patient at any time in the future upon receipt of referral and should he/she be interested in assistance from the [facility] team.   Penne Lash , RMA     [facility]  [facility], [facility] Guide  Direct Dial: [phone]  Website: [email or web address]

## 2023-11-07 ENCOUNTER — Ambulatory Visit (INDEPENDENT_AMBULATORY_CARE_PROVIDER_SITE_OTHER): Payer: MEDICAID | Admitting: Internal Medicine

## 2023-11-07 ENCOUNTER — Other Ambulatory Visit (HOSPITAL_COMMUNITY): Payer: Self-pay

## 2023-11-07 ENCOUNTER — Encounter: Payer: Self-pay | Admitting: Internal Medicine

## 2023-11-07 VITALS — BP 146/90 | HR 75 | Temp 97.5°F | Ht 68.0 in | Wt 212.1 lb

## 2023-11-07 DIAGNOSIS — I5042 Chronic combined systolic (congestive) and diastolic (congestive) heart failure: Secondary | ICD-10-CM | POA: Diagnosis not present

## 2023-11-07 DIAGNOSIS — Z59819 Housing instability, housed unspecified: Secondary | ICD-10-CM

## 2023-11-07 DIAGNOSIS — I110 Hypertensive heart disease with heart failure: Secondary | ICD-10-CM | POA: Diagnosis not present

## 2023-11-07 DIAGNOSIS — F1020 Alcohol dependence, uncomplicated: Secondary | ICD-10-CM

## 2023-11-07 DIAGNOSIS — I10 Essential (primary) hypertension: Secondary | ICD-10-CM

## 2023-11-07 DIAGNOSIS — E1136 Type 2 diabetes mellitus with diabetic cataract: Secondary | ICD-10-CM | POA: Diagnosis not present

## 2023-11-07 DIAGNOSIS — E785 Hyperlipidemia, unspecified: Secondary | ICD-10-CM | POA: Diagnosis not present

## 2023-11-07 DIAGNOSIS — F1010 Alcohol abuse, uncomplicated: Secondary | ICD-10-CM

## 2023-11-07 MED ORDER — SYNJARDY 5-1000 MG PO TABS
1.0000 | ORAL_TABLET | Freq: Two times a day (BID) | ORAL | 3 refills | Status: DC
  Filled 2023-11-07: qty 60, 30d supply, fill #0

## 2023-11-07 MED ORDER — FOLIC ACID 1 MG PO TABS
1.0000 mg | ORAL_TABLET | Freq: Every day | ORAL | 3 refills | Status: DC
  Filled 2023-11-07: qty 100, 100d supply, fill #0

## 2023-11-07 NOTE — Assessment & Plan Note (Addendum)
Patient with multiple negative social determinants of health including housing instability.  - Currently living in a local pallet shelter for the winter. 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  - He also utilizes resources at the [facility] which he visits daily.  - Following recent hospitalization, he is now getting medication assistance with a mobile clinic; gets pill box filled every Tuesday. This has helped tremendously with compliance as he is legally blind and cannot read his prescription labels.

## 2023-11-07 NOTE — Progress Notes (Signed)
Subjective:   Patient ID: Brady Church male   DOB: 09-25-1961 62 y.o.   MRN: [id number]  HPI: Mr.Brady Church is a 62 y.o. male with past medical history outlined below here for follow up of his chronic medical conditions. For further details of today's visit, please refer to the assessment and plan below.   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 Stopped in 2010   Tobacco use disorder 10/07/2014   Current Outpatient Medications  Medication Sig Dispense Refill   folic acid (FOLVITE) 1 MG tablet Take 1 tablet (1 mg total) by mouth daily. 100 tablet 3   aspirin EC 81 MG tablet Take 1 tablet (81 mg total) by mouth daily. Swallow whole. 90 tablet 3   atorvastatin (LIPITOR) 80 MG tablet Take 1 tablet (80 mg total) by mouth daily at 6 PM. 90 tablet 3   brimonidine (ALPHAGAN) 0.15 % ophthalmic solution Place 1 drop into both eyes 2 (two) times daily. 5 mL 6   carvedilol (COREG) 3.125 MG tablet Take 1 tablet (3.125 mg total) by mouth 2 (two) times daily with a meal. 180 tablet 3   Empagliflozin-metFORMIN HCl (SYNJARDY) 03-999 MG TABS Take 1 tablet by mouth 2 (two) times daily. 60 tablet 3   fluorometholone (FML) 0.1 % ophthalmic suspension Place 1 drop into both eyes daily. 5 mL 2   furosemide (LASIX) 40 MG tablet Take 1 tablet (40 mg total) by mouth 2 (two) times daily. 60 tablet 3   losartan (COZAAR) 25 MG tablet Take 1 tablet (25 mg total) by mouth daily. 90 tablet 3   Multiple Vitamin (MULTIVITAMIN WITH MINERALS) TABS tablet Take 1 tablet by mouth daily. 90 tablet 0   RHOPRESSA 0.02 % SOLN Apply 1 drop to eye at bedtime. Use in the evening 2.5 mL 3    ROCKLATAN 0.02-0.005 % SOLN Place 1 drop into the right eye at bedtime. 2.5 mL 0   SIMBRINZA 1-0.2 % SUSP Place 1 drop into the right eye 2 (two) times daily. 8 mL 3   spironolactone (ALDACTONE) 25 MG tablet Take 0.5 tablets (12.5 mg total) by mouth daily. 45 tablet 3   timolol (TIMOPTIC) 0.5 % ophthalmic solution Place 1 drop into the right eye 2 (two) times daily. 10 mL 2   No current facility-administered medications for this visit.   Family History  Problem Relation Age of Onset   Coronary artery disease Mother 22       died of MI   Cancer Father 66       Some GI cancer. Not sure if it was Colon Cancer or not.   Hypertension Father    Diabetes Maternal Grandmother    Social History   Socioeconomic History   Marital status: Single    Spouse name: Not on file   Number of children: Not on file   Years of education: 12   Highest education level: Not on file  Occupational History   Occupation: Not working  Tobacco Use   Smoking status: Former    Current packs/day: 0.00    Types: Cigarettes    Quit  date: 02/18/2017    Years since quitting: 6.7   Smokeless tobacco: Never  Vaping Use   Vaping status: Never Used  Substance and Sexual Activity   Alcohol use: Yes    Comment: Beer- 40 oz. every day.   Drug use: Not Currently    Types: Cocaine, Marijuana   Sexual activity: Not on file  Other Topics Concern   Not on file  Social History Narrative   Lives in [place] for last 10 years with "friend"  [name]      Is not married and does not have kids.   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   Social Determinants of Health   Financial Resource Strain: High Risk (10/02/2023)   Overall Financial Resource Strain (CARDIA)    Difficulty of Paying Living Expenses: Very hard  Food Insecurity: Food Insecurity Present (10/01/2023)   Hunger Vital Sign    Worried About Running Out of Food in the Last Year:  Often true    Ran Out of Food in the Last Year: Often true  Transportation Needs: Unmet Transportation Needs (10/01/2023)   PRAPARE - Administrator, Civil Service (Medical): Yes    Lack of Transportation (Non-Medical): Yes  Physical Activity: Not on file  Stress: Not on file  Social Connections: Socially Isolated (10/11/2022)   Social Connection and Isolation Panel [NHANES]    Frequency of Communication with Friends and Family: Never    Frequency of Social Gatherings with Friends and Family: Never    Attends Religious Services: Never    Database administrator or Organizations: No    Attends Engineer, structural: Never    Marital Status: Never married     Objective:  Physical Exam:  Vitals:   11/07/23 0840 11/07/23 0915  BP: (!) 141/80 (!) 146/90  Pulse: 81 75  Temp: (!) 97.5 F (36.4 C)   TempSrc: Oral   SpO2: 96%   Weight: 212 lb 1.6 oz (96.2 kg)   Height: 5\' 8"  (1.727 m)     Constitutional: NAD, well appearing  Cardiovascular: RRR, no m/r/g Pulmonary/Chest: Clear , normal effort Extremities: warm, no edema  Psychiatric: Good mood, positive affect  Assessment & Plan:   Diabetes mellitus with diabetic cataract (HCC) Type II diabetes, uncontrolled  - Recently started on BID synjardy; compliant with this. - Hgb A1c three weeks at elevated at 11.0 (up from 7.8 previously) - Continue current regimen, repeat Hgb A1c in 3 months  Alcohol use disorder, severe, dependence (HCC) Hx of polysubstance abuse, cocaine and alcohol use disorders. Recent UDS cocaine (+) during hospitalization for acute on chronic CHF.  - Has not used cocaine in 3 weeks; motivated remain abstinent  - Motivated to cut back out alcohol. Currently drinking about two 16 ounce beers a night. We discussed cutting back, does not think he can completely quit. Set a goal to cut back to one 16 ounce beer per night.  - Refilled folic acid; recommend daily OTC multivitamin  - Follow up 3  months   Chronic combined systolic and diastolic congestive heart failure (HCC) Hospitalized last month for acute on chronic HFrEF (35-40%). Seen by cards and felt to be due to polysubstance use (alcohol, cocaine). Discharged on lasix, coreg, and losartan. Seen by cardiology for follow up on 11/18; low dose spiro 12.5 mg daily added. Well compensated today, no symptoms. BP elevated but had not yet taking  morning medications. Rechecking BMP.   Housing instability Patient with multiple negative social determinants of health including housing instability.  - Currently living in a local pallet shelter for the winter. This is a Occupational hygienist for the homeless community here in [place].  - He also utilizes resources at the [facility] which he visits daily.  - Following recent hospitalization, he is now getting medication assistance with a mobile clinic; gets pill box filled every Tuesday. This has helped tremendously with compliance as he is legally blind and cannot read his prescription labels.   Hypertension Elevated today but had not yet taken his morning medications. Will follow up again in 3 months. Checking BMP.

## 2023-11-07 NOTE — Patient Instructions (Signed)
Mr. Glenney,  It was a pleasure to see you. Keep up the great work with your medications. Please follow up with me again in 3 months. If you have any questions or concerns, call our clinic at [phone] or after hours call [phone] and ask for the internal medicine resident on call.   Thank you!  Dr. Reece Agar

## 2023-11-07 NOTE — Assessment & Plan Note (Signed)
Elevated today but had not yet taken his morning medications. Will follow up again in 3 months. Checking BMP.

## 2023-11-07 NOTE — Assessment & Plan Note (Signed)
Hx of polysubstance abuse, cocaine and alcohol use disorders. Recent UDS cocaine (+) during hospitalization for acute on chronic CHF.  - Has not used cocaine in 3 weeks; motivated remain abstinent  - Motivated to cut back out alcohol. Currently drinking about two 16 ounce beers a night. We discussed cutting back, does not think he can completely quit. Set a goal to cut back to one 16 ounce beer per night.  - Refilled folic acid; recommend daily OTC multivitamin  - Follow up 3 months

## 2023-11-07 NOTE — Assessment & Plan Note (Signed)
Type II diabetes, uncontrolled  - Recently started on BID synjardy; compliant with this. - Hgb A1c three weeks at elevated at 11.0 (up from 7.8 previously) - Continue current regimen, repeat Hgb A1c in 3 months

## 2023-11-07 NOTE — Assessment & Plan Note (Signed)
Hospitalized last month for acute on chronic HFrEF (35-40%). Seen by cards and felt to be due to polysubstance use (alcohol, cocaine). Discharged on lasix, coreg, and losartan. Seen by cardiology for follow up on 11/18; low dose spiro 12.5 mg daily added. Well compensated today, no symptoms. BP elevated but had not yet taking morning medications. Rechecking BMP.

## 2023-11-08 ENCOUNTER — Other Ambulatory Visit: Payer: Self-pay | Admitting: Licensed Clinical Social Worker

## 2023-11-08 DIAGNOSIS — F4323 Adjustment disorder with mixed anxiety and depressed mood: Secondary | ICD-10-CM

## 2023-11-08 DIAGNOSIS — F1420 Cocaine dependence, uncomplicated: Secondary | ICD-10-CM

## 2023-11-08 DIAGNOSIS — F1020 Alcohol dependence, uncomplicated: Secondary | ICD-10-CM

## 2023-11-08 NOTE — Patient Outreach (Addendum)
Medicaid Managed Care Social Work Note  11/08/2023 Name:  Brady Church MRN:  [id number] DOB:  1961/05/26  Brady Church is an 62 y.o. year old male who is a primary patient of [name], MD.  The Medicaid Managed Care Coordination team was consulted for assistance with:  [employer] with patient  for by telephone forinitial visit in response to referral for case management and/or care coordination services.   Patient is participating in a Managed Medicaid Plan:  Yes  Assessments/Interventions:  Review of past medical history, allergies, medications, health status, including review of consultants reports, laboratory and other test data, was performed as part of comprehensive evaluation and provision of chronic care management services.  SDOH: (Social Drivers of Health) assessments and interventions performed: SDOH Interventions    Flowsheet Row Patient Outreach Telephone from 11/08/2023 in [place] [facility] ED to Hosp-Admission (Discharged) from 10/01/2023 in [place] [facility] Medical Unit Office Visit from 04/12/2022 in [facility] [place]  SDOH Interventions     Food Insecurity Interventions -- -- Other (Comment)  [patient given food from the food pantry]  Housing Interventions Community Resources Provided Other (Comment) --  Financial Strain Interventions -- Other (Comment) --       Care Plan                 No Known Allergies  Medications Reviewed Today   Medications were not reviewed in this encounter     Patient Active Problem List   Diagnosis Date Noted   Visual impairment 10/23/2023   Nephrolithiasis 08/30/2023   Housing instability 08/24/2022   Neuropathic pain of hand, right 01/11/2022   Bilateral foot pain 07/08/2021   Vitamin B12 deficiency 08/06/2019   Erectile dysfunction 05/06/2018   Post-nasal drip 10/01/2017   Peripheral neuropathy 08/06/2017    Adjustment disorder with mixed anxiety and depressed mood    CVA (cerebral vascular accident) (HCC) 02/19/2017   Intracranial vascular stenosis 02/16/2017   Cocaine use disorder, severe, dependence (HCC)    Chronic combined systolic and diastolic congestive heart failure (HCC) 05/12/2015   Alcohol use disorder, severe, dependence (HCC) 11/06/2014   History of tobacco use 10/07/2014   Healthcare maintenance 01/23/2014   Glaucoma 05/11/2012   Hyperlipidemia LDL goal <70 03/22/2012   Hypertension 01/17/2012   Diabetes mellitus with diabetic cataract (HCC) 03/08/2009   Patient has [employer] and was advised to contact program to get connected with their case management program. Patient and sister are agreeable. Patient reports being interested in gaining specifically housing related resources. [facility] LCSW provided patient with extensive education on [facility], his trillium health benefits and behavioral health resources within his area that accept his insurance. Patient was also provided emotional support over the phone. He denies any current crises nor any SI/HI but admits he is in a stressful situation regarding his housing. Patient declined wanting information on sober living options located in his area. Patient declined wanting a list of AA/NA meetings. Patient is agreeable to referral to [facility] for talk therapy. [facility] LCSW will sign off at this time as resources, education and support have been successfully provided.   Plan: Patient will contact [facility] to get connected to their case management program. Patient wrote this number physically down and agreed to contact them as soon as possible to gain these case management services. Per [facility] program policy, patient will contact [facility]  plan and benefits as instructed and [facility] LCSW will sign off at this time as resources have been successfully provided to patient and sister.  Dickie La, BSW, MSW,  LCSW Licensed Clinical Social Worker [employer]   [email or web address] Direct Dial: [phone]

## 2023-11-08 NOTE — Patient Instructions (Addendum)
Tailored Plan Medicaid On July 1, some people on [place] Medicaid will move to a new kind of Medicaid health plan called a Tailored Plan. Tailored Plans cover your doctor visits, prescription drugs, and health care services.    If your [place] Medicaid will move to a Tailored Plan, you should have gotten a letter and welcome packet. If you're not sure, call your [place] Medicaid Enrollment Broker at [phone] and ask.  Check out these free materials, in [place] and [place], to learn more about your Tailored Plan: [email or web address]-[email or web address]/[email or web address]  Tailored Care Management Services  TCM services are available to you now. If you are a Tailored Plan member or will be and want information about Tailored Care Management Services including rides to appointments and community and home services, call the Care Management provider for your county of residence:    [facility] [place], [place])  Member Services: [phone] Behavioral Health Crisis Line: [phone], [place], [place], [place], [place])  Member Services: [phone] Behavioral Health Crisis Line: [phone]) [phone]  [facility] [place]) Member Services: [phone] Behavioral Health Crisis Line: [phone]     Call me if you have any issues getting connected with your insurance!     24- Hour Availability:    [facility]  [address] [place], [place] [phone] Crisis [phone]   [facility] Service of the [facility] [phone]  [place] [facility]  [phone]    [facility]  [phone] (after hours)   Therapeutic Alternative/Mobile Crisis   [phone]   [place] National Suicide Hotline  (610) 681-3644 Len Child[place]) [place] 169   Call [phone] for mental health emergencies   [facility]  [phone]);  [place] and [employer]  [phone]); [place], [place],  [place], [place], [place], [place], [place]    [place] [facility] for [facility] Residents For 24/7 walk-up access to mental health services for [facility] children (4+), adolescents and adults, please visit the [facility] located at [address] in [place], [place].  *[facility] also provides comprehensive outpatient behavioral health services in a variety of locations around the [place].  Connect With [place] [address] [place], [place] [address] HelpLine: 717-176-4600 or [phone]  Get Directions  Find Help 24/7 By Phone Call our 24-hour HelpLine at 559-445-2650 or ([phone] for immediate assistance for mental health and substance abuse issues.  Walk-In Help [place]: [facility] (Ages 4 and Up) [place]: Emergency Dept., [facility] Additional Resources National Hopeline Network: 1-800-SUICIDE The National Suicide Prevention Lifeline: 1-800-273-TALK     The following coping skill education was provided for stress relief and mental health management: "When your car dies or a deadline looms, how do you respond? Long-term, low-grade or acute stress takes a serious toll on your body and mind, so don't ignore feelings of constant tension. Stress is a natural part of life. However, too much stress can harm our health, especially if it continues every day. This is chronic stress and can put you at risk for heart problems like heart disease and depression. Understand what's happening inside your body and learn simple coping skills to combat the negative impacts of everyday stressors.  Types of Stress There are two types of stress: Emotional - types of emotional stress are relationship problems, pressure at work, financial worries, experiencing discrimination or having a major life change. Physical - Examples of physical stress include being sick  having pain, not  sleeping well, recovery from an injury or having an alcohol and drug use disorder. Fight or Flight Sudden or ongoing stress activates your nervous system and floods your bloodstream with adrenaline and cortisol, two hormones that raise blood pressure, increase heart rate and spike blood sugar. These changes pitch your body into a fight or flight response. That enabled our ancestors to outrun saber-toothed tigers, and it's helpful today for situations like dodging a car accident. But most modern chronic stressors, such as finances or a challenging relationship, keep your body in that heightened state, which hurts your health. Effects of Too Much Stress If constantly under stress, most of [place] will eventually start to function less well.  Multiple studies link chronic stress to a higher risk of heart disease, stroke, depression, weight gain, memory loss and even premature death, so it's important to recognize the warning signals. Talk to your doctor about ways to manage stress if you're experiencing any of these symptoms: Prolonged periods of poor sleep. Regular, severe headaches. Unexplained weight loss or gain. Feelings of isolation, withdrawal or worthlessness. Constant anger and irritability. Loss of interest in activities. Constant worrying or obsessive thinking. Excessive alcohol or drug use. Inability to concentrate.  10 Ways to Cope with Chronic Stress It's key to recognize stressful situations as they occur because it allows you to focus on managing how you react. We all need to know when to close our eyes and take a deep breath when we feel tension rising. Use these tips to prevent or reduce chronic stress. 1. Rebalance Work and Home All work and no play? If you're spending too much time at the office, intentionally put more dates in your calendar to enjoy time for fun, either alone or with others. 2. Get Regular Exercise Moving your body on a regular basis balances the nervous system and  increases blood circulation, helping to flush out stress hormones. Even a daily 20-minute walk makes a difference. Any kind of exercise can lower stress and improve your mood ? just pick activities that you enjoy and make it a regular habit. 3. Eat Well and Limit Alcohol and Stimulants Alcohol, nicotine and caffeine may temporarily relieve stress but have negative health impacts and can make stress worse in the long run. Well-nourished bodies cope better, so start with a good breakfast, add more organic fruits and vegetables for a well-balanced diet, avoid processed foods and sugar, try herbal tea and drink more water. 4. Connect with Supportive People Talking face to face with another person releases hormones that reduce stress. Lean on those good listeners in your life. 5. Carve Out Hobby Time Do you enjoy gardening, reading, listening to music or some other creative pursuit? Engage in activities that bring you pleasure and joy; research shows that reduces stress by almost half and lowers your heart rate, too. 6. Practice Meditation, Stress Reduction or Yoga Relaxation techniques activate a state of restfulness that counterbalances your body's fight-or-flight hormones. Even if this also means a 10-minute break in a long day: listen to music, read, go for a walk in nature, do a hobby, take a bath or spend time with a friend. Also consider doing a mindfulness exercise or try a daily deep breathing or imagery practice. Deep Breathing Slow, calm and deep breathing can help you relax. Try these steps to focus on your breathing and repeat as needed. Find a comfortable position and close your eyes. Exhale and drop your shoulders. Breathe in through your nose; fill  your lungs and then your belly. Think of relaxing your body, quieting your mind and becoming calm and peaceful. Breathe out slowly through your nose, relaxing your belly. Think of releasing tension, pain, worries or distress. Repeat steps three  and four until you feel relaxed. Imagery This involves using your mind to excite the senses -- sound, vision, smell, taste and feeling. This may help ease your stress. Begin by getting comfortable and then do some slow breathing. Imagine a place you love being at. It could be somewhere from your childhood, somewhere you vacationed or just a place in your imagination. Feel how it is to be in the place you're imagining. Pay attention to the sounds, air, colors, and who is there with you. This is a place where you feel cared for and loved. All is well. You are safe. Take in all the smells, sounds, tastes and feelings. As you do, feel your body being nourished and healed. Feel the calm that surrounds you. Breathe in all the good. Breathe out any discomfort or tension. 7. Sleep Enough If you get less than seven to eight hours of sleep, your body won't tolerate stress as well as it could. If stress keeps you up at night, address the cause, and add extra meditation into your day to make up for the lost z's. Try to get seven to nine hours of sleep each night. Make a regular bedtime schedule. Keep your room dark and cool. Try to avoid computers, TV, cell phones and tablets before bed. 8. Bond with Connections You Enjoy Go out for a coffee with a friend, chat with a neighbor, call a family member, visit with a clergy member, or even hang out with your pet. Clinical studies show that spending even a short time with a companion animal can cut anxiety levels almost in half. 9. Take a Vacation Getting away from it all can reset your stress tolerance by increasing your mental and emotional outlook, which makes you a happier, more productive person upon return. Leave your cellphone and laptop at home! 10. See a Counselor, Coach or Therapist If negative thoughts overwhelm your ability to make positive changes, it's time to seek professional help. Make an appointment today--your health and life are worth  it."  Review the numbers and resource information I gave you!   Dickie La, BSW, MSW, LCSW Licensed Clinical Social Worker [employer]   [facility] [email or web address] Direct Dial: [phone]

## 2023-11-09 LAB — LIPID PANEL
Chol/HDL Ratio: 3 {ratio} (ref 0.0–5.0)
Cholesterol, Total: 170 mg/dL (ref 100–199)
HDL: 56 mg/dL (ref >39)
LDL Chol Calc (NIH): 90 mg/dL (ref 0–99)
Triglycerides: 138 mg/dL (ref 0–149)
VLDL Cholesterol Cal: 24 mg/dL (ref 5–40)

## 2023-11-09 LAB — BMP8+ANION GAP
Anion Gap: 16 mmol/L (ref 10.0–18.0)
BUN/Creatinine Ratio: 17 (ref 10–24)
BUN: 18 mg/dL (ref 8–27)
CO2: 25 mmol/L (ref 20–29)
Calcium: 9.8 mg/dL (ref 8.6–10.2)
Chloride: 99 mmol/L (ref 96–106)
Creatinine, Ser: 1.06 mg/dL (ref 0.76–1.27)
Glucose: 140 mg/dL — ABNORMAL HIGH (ref 70–99)
Potassium: 3.8 mmol/L (ref 3.5–5.2)
Sodium: 140 mmol/L (ref 134–144)
eGFR: 80 mL/min/{1.73_m2} (ref >59)

## 2023-11-19 ENCOUNTER — Other Ambulatory Visit (HOSPITAL_COMMUNITY): Payer: Self-pay

## 2023-11-22 ENCOUNTER — Emergency Department (HOSPITAL_COMMUNITY): Payer: MEDICAID

## 2023-11-22 ENCOUNTER — Encounter (HOSPITAL_COMMUNITY): Payer: Self-pay

## 2023-11-22 ENCOUNTER — Other Ambulatory Visit: Payer: Self-pay

## 2023-11-22 ENCOUNTER — Inpatient Hospital Stay (HOSPITAL_COMMUNITY)
Admission: EM | Admit: 2023-11-22 | Discharge: 2023-11-26 | DRG: 897 | Disposition: A | Discharge status: Home / Self Care | Payer: MEDICAID | Attending: Internal Medicine | Admitting: Internal Medicine

## 2023-11-22 DIAGNOSIS — I679 Cerebrovascular disease, unspecified: Secondary | ICD-10-CM | POA: Diagnosis present

## 2023-11-22 DIAGNOSIS — X58XXXA Exposure to other specified factors, initial encounter: Secondary | ICD-10-CM | POA: Diagnosis present

## 2023-11-22 DIAGNOSIS — F10229 Alcohol dependence with intoxication, unspecified: Principal | ICD-10-CM | POA: Diagnosis present

## 2023-11-22 DIAGNOSIS — E876 Hypokalemia: Secondary | ICD-10-CM | POA: Diagnosis present

## 2023-11-22 DIAGNOSIS — L089 Local infection of the skin and subcutaneous tissue, unspecified: Secondary | ICD-10-CM | POA: Diagnosis present

## 2023-11-22 DIAGNOSIS — R748 Abnormal levels of other serum enzymes: Secondary | ICD-10-CM | POA: Diagnosis present

## 2023-11-22 DIAGNOSIS — N179 Acute kidney failure, unspecified: Secondary | ICD-10-CM | POA: Diagnosis present

## 2023-11-22 DIAGNOSIS — Z9841 Cataract extraction status, right eye: Secondary | ICD-10-CM

## 2023-11-22 DIAGNOSIS — Y901 Blood alcohol level of 20-39 mg/100 ml: Secondary | ICD-10-CM | POA: Diagnosis present

## 2023-11-22 DIAGNOSIS — I639 Cerebral infarction, unspecified: Secondary | ICD-10-CM | POA: Diagnosis present

## 2023-11-22 DIAGNOSIS — Z8249 Family history of ischemic heart disease and other diseases of the circulatory system: Secondary | ICD-10-CM

## 2023-11-22 DIAGNOSIS — I110 Hypertensive heart disease with heart failure: Secondary | ICD-10-CM | POA: Diagnosis present

## 2023-11-22 DIAGNOSIS — W228XXA Striking against or struck by other objects, initial encounter: Secondary | ICD-10-CM | POA: Diagnosis present

## 2023-11-22 DIAGNOSIS — S91332A Puncture wound without foreign body, left foot, initial encounter: Secondary | ICD-10-CM | POA: Diagnosis present

## 2023-11-22 DIAGNOSIS — Z87891 Personal history of nicotine dependence: Secondary | ICD-10-CM

## 2023-11-22 DIAGNOSIS — Z91141 Patient's other noncompliance with medication regimen due to financial hardship: Secondary | ICD-10-CM

## 2023-11-22 DIAGNOSIS — Z79899 Other long term (current) drug therapy: Secondary | ICD-10-CM

## 2023-11-22 DIAGNOSIS — Z5986 Financial insecurity: Secondary | ICD-10-CM

## 2023-11-22 DIAGNOSIS — E118 Type 2 diabetes mellitus with unspecified complications: Secondary | ICD-10-CM | POA: Diagnosis present

## 2023-11-22 DIAGNOSIS — F1410 Cocaine abuse, uncomplicated: Secondary | ICD-10-CM | POA: Diagnosis present

## 2023-11-22 DIAGNOSIS — H548 Legal blindness, as defined in USA: Secondary | ICD-10-CM | POA: Insufficient documentation

## 2023-11-22 DIAGNOSIS — E7800 Pure hypercholesterolemia, unspecified: Secondary | ICD-10-CM | POA: Diagnosis present

## 2023-11-22 DIAGNOSIS — H409 Unspecified glaucoma: Secondary | ICD-10-CM | POA: Diagnosis present

## 2023-11-22 DIAGNOSIS — Z5900 Homelessness unspecified: Secondary | ICD-10-CM

## 2023-11-22 DIAGNOSIS — F1420 Cocaine dependence, uncomplicated: Secondary | ICD-10-CM

## 2023-11-22 DIAGNOSIS — H547 Unspecified visual loss: Secondary | ICD-10-CM

## 2023-11-22 DIAGNOSIS — I5042 Chronic combined systolic (congestive) and diastolic (congestive) heart failure: Secondary | ICD-10-CM | POA: Diagnosis present

## 2023-11-22 DIAGNOSIS — E860 Dehydration: Secondary | ICD-10-CM | POA: Diagnosis present

## 2023-11-22 DIAGNOSIS — Z1152 Encounter for screening for COVID-19: Secondary | ICD-10-CM

## 2023-11-22 DIAGNOSIS — Z833 Family history of diabetes mellitus: Secondary | ICD-10-CM

## 2023-11-22 DIAGNOSIS — Z87442 Personal history of urinary calculi: Secondary | ICD-10-CM

## 2023-11-22 DIAGNOSIS — I959 Hypotension, unspecified: Secondary | ICD-10-CM | POA: Diagnosis present

## 2023-11-22 DIAGNOSIS — Z7982 Long term (current) use of aspirin: Secondary | ICD-10-CM

## 2023-11-22 DIAGNOSIS — Z8673 Personal history of transient ischemic attack (TIA), and cerebral infarction without residual deficits: Secondary | ICD-10-CM

## 2023-11-22 DIAGNOSIS — E1165 Type 2 diabetes mellitus with hyperglycemia: Secondary | ICD-10-CM | POA: Diagnosis present

## 2023-11-22 DIAGNOSIS — E1136 Type 2 diabetes mellitus with diabetic cataract: Secondary | ICD-10-CM | POA: Diagnosis present

## 2023-11-22 DIAGNOSIS — F1020 Alcohol dependence, uncomplicated: Secondary | ICD-10-CM

## 2023-11-22 DIAGNOSIS — R9431 Abnormal electrocardiogram [ECG] [EKG]: Secondary | ICD-10-CM | POA: Diagnosis present

## 2023-11-22 DIAGNOSIS — Z5982 Transportation insecurity: Secondary | ICD-10-CM

## 2023-11-22 DIAGNOSIS — R55 Syncope and collapse: Principal | ICD-10-CM | POA: Diagnosis present

## 2023-11-22 DIAGNOSIS — F1010 Alcohol abuse, uncomplicated: Secondary | ICD-10-CM

## 2023-11-22 DIAGNOSIS — Z5941 Food insecurity: Secondary | ICD-10-CM

## 2023-11-22 LAB — CBC
HCT: 44.1 % (ref 39.0–52.0)
Hemoglobin: 14.6 g/dL (ref 13.0–17.0)
MCH: 32.2 pg (ref 26.0–34.0)
MCHC: 33.1 g/dL (ref 30.0–36.0)
MCV: 97.1 fL (ref 80.0–100.0)
Platelets: 252 10*3/uL (ref 150–400)
RBC: 4.54 MIL/uL (ref 4.22–5.81)
RDW: 14.9 % (ref 11.5–15.5)
WBC: 5.6 10*3/uL (ref 4.0–10.5)
nRBC: 0 % (ref 0.0–0.2)

## 2023-11-22 LAB — PROTIME-INR
INR: 1 (ref 0.8–1.2)
Prothrombin Time: 13.8 s (ref 11.4–15.2)

## 2023-11-22 NOTE — ED Triage Notes (Addendum)
Arrives [facility] from [facility].   Staff made notification with 911 after pt fell from a chair. Patient admits that he just "felt hot" prior to syncopal episode.   Upon paramedic arrival pt found hypotensive at 88/50 followed by 62/40. 500ccNS admin and 4mg  zofran with (+) improvement in VS 120/70.   Has been drinking heavily 2 days ago.

## 2023-11-22 NOTE — ED Provider Notes (Signed)
[facility]-EMERGENCY DEPT [facility] Emergency Department Provider Note MRN:  [id number]  Arrival date & time: 11/23/23     Chief Complaint   Syncope History of Present Illness   Brady Church is a 62 y.o. year-old male with a history of CHF, substance use disorder presenting to the ED with chief complaint of syncope.  Patient was sitting in the chair relaxed and then suddenly passed out.  Felt very warm prior to the syncopal event.  Denies trauma.  Denies chest pain or shortness of breath before or after the syncopal event.  Denies recent fever or cough.  Currently feels weak and tired but denies pain, no other complaints.  Per EMS patient has had some heavy drinking recently but quit drinking about 2 days ago.  Review of Systems  A thorough review of systems was obtained and all systems are negative except as noted in the HPI and PMH.   Patient's Health History    Past Medical History:  Diagnosis Date   Alcohol use disorder, severe, dependence (HCC) 11/06/2014   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 Stopped in 2010   Tobacco use disorder 10/07/2014    Past Surgical History:  Procedure Laterality Date   CATARACT EXTRACTION  01/08/2012   Right eye   EYE SURGERY     x 4 total     Family History  Problem Relation Age of Onset   Coronary artery disease Mother 27       died of MI   Cancer Father 75       Some GI cancer. Not sure if it was Colon Cancer or not.   Hypertension Father    Diabetes Maternal Grandmother     Social History   Socioeconomic History   Marital status: Single    Spouse name: Not on file   Number of children: Not on file   Years of education: 12   Highest education level: Not on file  Occupational History    Occupation: Not working  Tobacco Use   Smoking status: Former    Current packs/day: 0.00    Types: Cigarettes    Quit date: 02/18/2017    Years since quitting: 6.7   Smokeless tobacco: Never  Vaping Use   Vaping status: Never Used  Substance and Sexual Activity   Alcohol use: Yes    Comment: Beer- 40 oz. every day.   Drug use: Not Currently    Types: Cocaine, Marijuana   Sexual activity: Not on file  Other Topics Concern   Not on file  Social History Narrative   Lives in [place] for last 10 years with "friend"  [name]      Is not married and does not have kids.   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   Social Drivers of Health   Financial Resource Strain: High Risk (10/02/2023)   Overall Financial Resource Strain (CARDIA)    Difficulty of Paying Living Expenses: Very hard  Food Insecurity: Food Insecurity Present (10/01/2023)   Hunger Vital Sign    Worried About Running Out of Food in the Last Year: Often true  Ran Out of Food in the Last Year: Often true  Transportation Needs: Unmet Transportation Needs (10/01/2023)   PRAPARE - Administrator, Civil Service (Medical): Yes    Lack of Transportation (Non-Medical): Yes  Physical Activity: Not on file  Stress: Not on file  Social Connections: Socially Isolated (10/11/2022)   Social Connection and Isolation Panel [NHANES]    Frequency of Communication with Friends and Family: Never    Frequency of Social Gatherings with Friends and Family: Never    Attends Religious Services: Never    Database administrator or Organizations: No    Attends Banker Meetings: Never    Marital Status: Never married  Intimate Partner Violence: Not At Risk (10/01/2023)   Humiliation, Afraid, Rape, and Kick questionnaire    Fear of Current or Ex-Partner: No    Emotionally Abused: No    Physically Abused: No    Sexually Abused: No      Physical Exam   Vitals:   11/23/23 0030 11/23/23 0215  BP: 121/77 118/81  Pulse: 72 71  Resp: 13 11  Temp:    SpO2: 96% 100%    CONSTITUTIONAL: Well-appearing, NAD, mildly diaphoretic NEURO/PSYCH:  Alert and oriented x 3, normal and symmetric strength and sensation, normal coordination, normal speech, no tremulousness EYES:  eyes equal and reactive ENT/NECK:  no LAD, no JVD CARDIO: Regular rate, well-perfused, normal S1 and S2 PULM:  CTAB no wheezing or rhonchi GI/GU:  non-distended, non-tender MSK/SPINE:  No gross deformities, no edema SKIN:  no rash, atraumatic   *Additional and/or pertinent findings included in MDM below  Diagnostic and Interventional Summary    EKG Interpretation Date/Time:  Thursday November 22 2023 23:15:29 EST Ventricular Rate:  76 PR Interval:  182 QRS Duration:  105 QT Interval:  491 QTC Calculation: 553 R Axis:   -26  Text Interpretation: Sinus rhythm Abnormal R-wave progression, early transition LVH with secondary repolarization abnormality Prolonged QT interval Inverted T waves have replaced nonspecific T wave abnormality in v4-v6 Confirmed by Kennis Carina [phone]) on 11/22/2023 11:25:08 PM       Labs Reviewed  COMPREHENSIVE METABOLIC PANEL - Abnormal; Notable for the following components:      Result Value   Potassium 3.1 (*)    CO2 20 (*)    Glucose, Bld 169 (*)    Creatinine, Ser 1.28 (*)    Calcium 8.5 (*)    AST 47 (*)    All other components within normal limits  BRAIN NATRIURETIC PEPTIDE - Abnormal; Notable for the following components:   B Natriuretic Peptide 147.9 (*)    All other components within normal limits  ETHANOL - Abnormal; Notable for the following components:   Alcohol, Ethyl (B) 24 (*)    All other components within normal limits  TROPONIN I (HIGH SENSITIVITY) - Abnormal; Notable for the following components:   Troponin I (High Sensitivity) 33 (*)    All other components within normal limits  RESP PANEL BY  RT-PCR (RSV, FLU A&B, COVID)  RVPGX2  CBC  PROTIME-INR  RAPID URINE DRUG SCREEN, HOSP PERFORMED  TROPONIN I (HIGH SENSITIVITY)    DG Chest Port 1 View  Final Result      Medications - No data to display   Procedures  /  Critical Care Procedures  ED Course and Medical Decision Making  Initial Impression and Ddx Syncope, question arrhythmia given patient's CHF history.  Would be considered high risk syncope.  Patient had hypotension  in the field with EMS.  Blood pressures improved with fluids.  Possible recent drug and/or alcohol illness.  Admitted last month for pulmonary edema and hypoxic respiratory failure in the setting of cocaine use.  There is also this report of him quitting drinking after binge drinking recently and so withdrawal seizure is possible as well though patient does not appear to be in acute alcohol withdrawal at this time.  Doubt acute CNS emergency given patient's reassuring neurological exam at this time.  Past medical/surgical history that increases complexity of ED encounter: CHF  Interpretation of Diagnostics I personally reviewed the EKG and my interpretation is as follows: T wave changes V4 through V6  Labs reveal mildly elevated troponin, mild hypokalemia  Patient Reassessment and Ultimate Disposition/Management     Admitted to hospitalist service for further care.  Patient management required discussion with the following services or consulting groups:  Hospitalist Service  Complexity of Problems Addressed Acute illness or injury that poses threat of life of bodily function  Additional Data Reviewed and Analyzed Further history obtained from: Prior labs/imaging results  Additional Factors Impacting ED Encounter Risk Consideration of hospitalization  Elmer Sow. Pilar Plate, MD [facility] Emergency Medicine [facility] [email or web address]  Final Clinical Impressions(s) / ED Diagnoses     ICD-10-CM   1. Syncope, unspecified syncope  type  R55       ED Discharge Orders     None        Discharge Instructions Discussed with and Provided to Patient:   Discharge Instructions   None      Sabas Sous, MD 11/23/23 [phone]

## 2023-11-23 ENCOUNTER — Observation Stay (HOSPITAL_COMMUNITY): Payer: MEDICAID

## 2023-11-23 DIAGNOSIS — E118 Type 2 diabetes mellitus with unspecified complications: Secondary | ICD-10-CM | POA: Diagnosis not present

## 2023-11-23 DIAGNOSIS — R9431 Abnormal electrocardiogram [ECG] [EKG]: Secondary | ICD-10-CM | POA: Diagnosis present

## 2023-11-23 DIAGNOSIS — Z8673 Personal history of transient ischemic attack (TIA), and cerebral infarction without residual deficits: Secondary | ICD-10-CM | POA: Diagnosis not present

## 2023-11-23 DIAGNOSIS — Z5982 Transportation insecurity: Secondary | ICD-10-CM | POA: Diagnosis not present

## 2023-11-23 DIAGNOSIS — F1410 Cocaine abuse, uncomplicated: Secondary | ICD-10-CM

## 2023-11-23 DIAGNOSIS — F1010 Alcohol abuse, uncomplicated: Secondary | ICD-10-CM | POA: Diagnosis not present

## 2023-11-23 DIAGNOSIS — Z5900 Homelessness unspecified: Secondary | ICD-10-CM | POA: Diagnosis not present

## 2023-11-23 DIAGNOSIS — I959 Hypotension, unspecified: Secondary | ICD-10-CM | POA: Diagnosis present

## 2023-11-23 DIAGNOSIS — E1165 Type 2 diabetes mellitus with hyperglycemia: Secondary | ICD-10-CM | POA: Diagnosis present

## 2023-11-23 DIAGNOSIS — R55 Syncope and collapse: Secondary | ICD-10-CM | POA: Diagnosis not present

## 2023-11-23 DIAGNOSIS — E7800 Pure hypercholesterolemia, unspecified: Secondary | ICD-10-CM | POA: Diagnosis present

## 2023-11-23 DIAGNOSIS — I951 Orthostatic hypotension: Secondary | ICD-10-CM | POA: Insufficient documentation

## 2023-11-23 DIAGNOSIS — Z87891 Personal history of nicotine dependence: Secondary | ICD-10-CM | POA: Diagnosis not present

## 2023-11-23 DIAGNOSIS — R569 Unspecified convulsions: Secondary | ICD-10-CM

## 2023-11-23 DIAGNOSIS — Y901 Blood alcohol level of 20-39 mg/100 ml: Secondary | ICD-10-CM | POA: Diagnosis present

## 2023-11-23 DIAGNOSIS — E876 Hypokalemia: Secondary | ICD-10-CM | POA: Diagnosis present

## 2023-11-23 DIAGNOSIS — W228XXA Striking against or struck by other objects, initial encounter: Secondary | ICD-10-CM | POA: Diagnosis present

## 2023-11-23 DIAGNOSIS — Z1152 Encounter for screening for COVID-19: Secondary | ICD-10-CM | POA: Diagnosis not present

## 2023-11-23 DIAGNOSIS — I110 Hypertensive heart disease with heart failure: Secondary | ICD-10-CM | POA: Diagnosis present

## 2023-11-23 DIAGNOSIS — F1420 Cocaine dependence, uncomplicated: Secondary | ICD-10-CM | POA: Insufficient documentation

## 2023-11-23 DIAGNOSIS — X58XXXA Exposure to other specified factors, initial encounter: Secondary | ICD-10-CM | POA: Diagnosis present

## 2023-11-23 DIAGNOSIS — R748 Abnormal levels of other serum enzymes: Secondary | ICD-10-CM | POA: Diagnosis present

## 2023-11-23 DIAGNOSIS — S91332A Puncture wound without foreign body, left foot, initial encounter: Secondary | ICD-10-CM | POA: Diagnosis present

## 2023-11-23 DIAGNOSIS — Z5986 Financial insecurity: Secondary | ICD-10-CM | POA: Diagnosis not present

## 2023-11-23 DIAGNOSIS — H548 Legal blindness, as defined in USA: Secondary | ICD-10-CM | POA: Diagnosis present

## 2023-11-23 DIAGNOSIS — F10229 Alcohol dependence with intoxication, unspecified: Secondary | ICD-10-CM | POA: Diagnosis present

## 2023-11-23 DIAGNOSIS — H409 Unspecified glaucoma: Secondary | ICD-10-CM | POA: Diagnosis present

## 2023-11-23 DIAGNOSIS — N179 Acute kidney failure, unspecified: Secondary | ICD-10-CM | POA: Diagnosis present

## 2023-11-23 DIAGNOSIS — E1136 Type 2 diabetes mellitus with diabetic cataract: Secondary | ICD-10-CM | POA: Diagnosis present

## 2023-11-23 DIAGNOSIS — E860 Dehydration: Secondary | ICD-10-CM | POA: Diagnosis present

## 2023-11-23 DIAGNOSIS — I5042 Chronic combined systolic (congestive) and diastolic (congestive) heart failure: Secondary | ICD-10-CM | POA: Diagnosis present

## 2023-11-23 DIAGNOSIS — Z8249 Family history of ischemic heart disease and other diseases of the circulatory system: Secondary | ICD-10-CM | POA: Diagnosis not present

## 2023-11-23 LAB — ECHOCARDIOGRAM COMPLETE
AR max vel: 2.98 cm2
AV Peak grad: 5.5 mm[Hg]
Ao pk vel: 1.17 m/s
Area-P 1/2: 3.53 cm2
Calc EF: 39.1 %
Height: 68 in
S' Lateral: 4.9 cm
Single Plane A2C EF: 38.4 %
Single Plane A4C EF: 38.7 %
Weight: 3440 [oz_av]

## 2023-11-23 LAB — GLUCOSE, CAPILLARY
Glucose-Capillary: 275 mg/dL — ABNORMAL HIGH (ref 70–99)
Glucose-Capillary: 177 mg/dL — ABNORMAL HIGH (ref 70–99)
Glucose-Capillary: 102 mg/dL — ABNORMAL HIGH (ref 70–99)

## 2023-11-23 LAB — COMPREHENSIVE METABOLIC PANEL
ALT: 28 U/L (ref 0–44)
ALT: 29 U/L (ref 0–44)
AST: 47 U/L — ABNORMAL HIGH (ref 15–41)
AST: 46 U/L — ABNORMAL HIGH (ref 15–41)
Albumin: 3.8 g/dL (ref 3.5–5.0)
Albumin: 3.7 g/dL (ref 3.5–5.0)
Alkaline Phosphatase: 52 U/L (ref 38–126)
Alkaline Phosphatase: 51 U/L (ref 38–126)
Anion gap: 14 (ref 5–15)
Anion gap: 6 (ref 5–15)
BUN: 13 mg/dL (ref 8–23)
BUN: 13 mg/dL (ref 8–23)
CO2: 20 mmol/L — ABNORMAL LOW (ref 22–32)
CO2: 28 mmol/L (ref 22–32)
Calcium: 8.5 mg/dL — ABNORMAL LOW (ref 8.9–10.3)
Calcium: 8.7 mg/dL — ABNORMAL LOW (ref 8.9–10.3)
Chloride: 101 mmol/L (ref 98–111)
Chloride: 102 mmol/L (ref 98–111)
Creatinine, Ser: 1.28 mg/dL — ABNORMAL HIGH (ref 0.61–1.24)
Creatinine, Ser: 1.06 mg/dL (ref 0.61–1.24)
GFR, Estimated: >60 mL/min (ref >60)
GFR, Estimated: >60 mL/min (ref >60)
Glucose, Bld: 169 mg/dL — ABNORMAL HIGH (ref 70–99)
Glucose, Bld: 139 mg/dL — ABNORMAL HIGH (ref 70–99)
Potassium: 3.1 mmol/L — ABNORMAL LOW (ref 3.5–5.1)
Potassium: 4.3 mmol/L (ref 3.5–5.1)
Sodium: 135 mmol/L (ref 135–145)
Sodium: 136 mmol/L (ref 135–145)
Total Bilirubin: 0.6 mg/dL (ref <1.2)
Total Bilirubin: 0.9 mg/dL (ref <1.2)
Total Protein: 7 g/dL (ref 6.5–8.1)
Total Protein: 7 g/dL (ref 6.5–8.1)

## 2023-11-23 LAB — CBC WITH DIFFERENTIAL/PLATELET
Abs Immature Granulocytes: 0.01 10*3/uL (ref 0.00–0.07)
Basophils Absolute: 0 10*3/uL (ref 0.0–0.1)
Basophils Relative: 0 %
Eosinophils Absolute: 0.2 10*3/uL (ref 0.0–0.5)
Eosinophils Relative: 3 %
HCT: 41.1 % (ref 39.0–52.0)
Hemoglobin: 14.3 g/dL (ref 13.0–17.0)
Immature Granulocytes: 0 %
Lymphocytes Relative: 30 %
Lymphs Abs: 1.6 10*3/uL (ref 0.7–4.0)
MCH: 32.1 pg (ref 26.0–34.0)
MCHC: 34.8 g/dL (ref 30.0–36.0)
MCV: 92.4 fL (ref 80.0–100.0)
Monocytes Absolute: 0.4 10*3/uL (ref 0.1–1.0)
Monocytes Relative: 7 %
Neutro Abs: 3.2 10*3/uL (ref 1.7–7.7)
Neutrophils Relative %: 60 %
Platelets: 294 10*3/uL (ref 150–400)
RBC: 4.45 MIL/uL (ref 4.22–5.81)
RDW: 14.6 % (ref 11.5–15.5)
WBC: 5.5 10*3/uL (ref 4.0–10.5)
nRBC: 0 % (ref 0.0–0.2)

## 2023-11-23 LAB — CBG MONITORING, ED
Glucose-Capillary: 123 mg/dL — ABNORMAL HIGH (ref 70–99)
Glucose-Capillary: 182 mg/dL — ABNORMAL HIGH (ref 70–99)

## 2023-11-23 LAB — URINALYSIS, ROUTINE W REFLEX MICROSCOPIC
Bacteria, UA: NONE SEEN
Bilirubin Urine: NEGATIVE
Glucose, UA: >=500 mg/dL — AB
Hgb urine dipstick: NEGATIVE
Ketones, ur: NEGATIVE mg/dL
Leukocytes,Ua: NEGATIVE
Nitrite: NEGATIVE
Protein, ur: 100 mg/dL — AB
Specific Gravity, Urine: 1.023 (ref 1.005–1.030)
pH: 5 (ref 5.0–8.0)

## 2023-11-23 LAB — BRAIN NATRIURETIC PEPTIDE: B Natriuretic Peptide: 147.9 pg/mL — ABNORMAL HIGH (ref 0.0–100.0)

## 2023-11-23 LAB — RESP PANEL BY RT-PCR (RSV, FLU A&B, COVID)  RVPGX2
Influenza A by PCR: NEGATIVE
Influenza B by PCR: NEGATIVE
Resp Syncytial Virus by PCR: NEGATIVE
SARS Coronavirus 2 by RT PCR: NEGATIVE

## 2023-11-23 LAB — HEPATITIS B CORE ANTIBODY, TOTAL: Hep B Core Total Ab: NONREACTIVE

## 2023-11-23 LAB — HEPATITIS B SURFACE ANTIGEN: Hepatitis B Surface Ag: NONREACTIVE

## 2023-11-23 LAB — I-STAT CG4 LACTIC ACID, ED: Lactic Acid, Venous: 1.6 mmol/L (ref 0.5–1.9)

## 2023-11-23 LAB — TROPONIN I (HIGH SENSITIVITY)
Troponin I (High Sensitivity): 33 ng/L — ABNORMAL HIGH (ref <18)
Troponin I (High Sensitivity): 20 ng/L — ABNORMAL HIGH (ref <18)

## 2023-11-23 LAB — HEPATITIS A ANTIBODY, TOTAL: hep A Total Ab: NONREACTIVE

## 2023-11-23 LAB — HEPATITIS B CORE ANTIBODY, IGM: Hep B C IgM: NONREACTIVE

## 2023-11-23 LAB — ETHANOL: Alcohol, Ethyl (B): 24 mg/dL — ABNORMAL HIGH (ref <10)

## 2023-11-23 MED ORDER — THIAMINE MONONITRATE 100 MG PO TABS
100.0000 mg | ORAL_TABLET | Freq: Every day | ORAL | Status: DC
Start: 2023-11-23 — End: 2023-11-26
  Administered 2023-11-23 – 2023-11-26 (×4): 100 mg via ORAL
  Filled 2023-11-23 (×4): qty 1

## 2023-11-23 MED ORDER — POTASSIUM CHLORIDE CRYS ER 20 MEQ PO TBCR
40.0000 meq | EXTENDED_RELEASE_TABLET | Freq: Once | ORAL | Status: AC
  Administered 2023-11-23: 40 meq via ORAL
  Filled 2023-11-23: qty 2

## 2023-11-23 MED ORDER — SODIUM CHLORIDE 0.9% FLUSH
3.0000 mL | Freq: Two times a day (BID) | INTRAVENOUS | Status: DC
  Administered 2023-11-23 – 2023-11-26 (×8): 3 mL via INTRAVENOUS

## 2023-11-23 MED ORDER — ADULT MULTIVITAMIN W/MINERALS CH
1.0000 | ORAL_TABLET | Freq: Every day | ORAL | Status: DC
  Administered 2023-11-23 – 2023-11-26 (×4): 1 via ORAL
  Filled 2023-11-23 (×4): qty 1

## 2023-11-23 MED ORDER — NETARSUDIL-LATANOPROST 0.02-0.005 % OP SOLN: 1.0000 [drp] | Freq: Every day | OPHTHALMIC | Status: DC

## 2023-11-23 MED ORDER — BRIMONIDINE TARTRATE 0.15 % OP SOLN: 1.0000 [drp] | Freq: Two times a day (BID) | OPHTHALMIC | Status: DC

## 2023-11-23 MED ORDER — FLUOROMETHOLONE 0.1 % OP SUSP: 1.0000 [drp] | Freq: Every day | OPHTHALMIC | Status: DC

## 2023-11-23 MED ORDER — ENOXAPARIN SODIUM 40 MG/0.4ML IJ SOSY
40.0000 mg | PREFILLED_SYRINGE | Freq: Every day | INTRAMUSCULAR | Status: DC
  Administered 2023-11-23 – 2023-11-26 (×4): 40 mg via SUBCUTANEOUS
  Filled 2023-11-23 (×4): qty 0.4

## 2023-11-23 MED ORDER — THIAMINE HCL 100 MG/ML IJ SOLN
100.0000 mg | Freq: Every day | INTRAMUSCULAR | Status: DC
Start: 2023-11-23 — End: 2023-11-26
  Filled 2023-11-23: qty 2

## 2023-11-23 MED ORDER — ATORVASTATIN CALCIUM 80 MG PO TABS
80.0000 mg | ORAL_TABLET | Freq: Every day | ORAL | Status: DC
  Administered 2023-11-23 – 2023-11-25 (×3): 80 mg via ORAL
  Filled 2023-11-23 (×4): qty 1

## 2023-11-23 MED ORDER — TIMOLOL MALEATE 0.5 % OP SOLN
1.0000 [drp] | Freq: Two times a day (BID) | OPHTHALMIC | Status: DC
  Administered 2023-11-23 – 2023-11-26 (×6): 1 [drp] via OPHTHALMIC
  Filled 2023-11-23: qty 5

## 2023-11-23 MED ORDER — INFLUENZA VAC A&B SURF ANT ADJ 0.5 ML IM SUSY
0.5000 mL | PREFILLED_SYRINGE | INTRAMUSCULAR | Status: DC
  Filled 2023-11-23: qty 0.5

## 2023-11-23 MED ORDER — HEPARIN SODIUM (PORCINE) 5000 UNIT/ML IJ SOLN: 5000.0000 [IU] | Freq: Three times a day (TID) | INTRAMUSCULAR | Status: DC

## 2023-11-23 MED ORDER — INSULIN ASPART 100 UNIT/ML IJ SOLN
0.0000 [IU] | Freq: Every day | INTRAMUSCULAR | Status: DC
  Administered 2023-11-24: 3 [IU] via SUBCUTANEOUS

## 2023-11-23 MED ORDER — INSULIN ASPART 100 UNIT/ML IJ SOLN
0.0000 [IU] | Freq: Three times a day (TID) | INTRAMUSCULAR | Status: DC
  Administered 2023-11-23: 3 [IU] via SUBCUTANEOUS
  Administered 2023-11-23: 8 [IU] via SUBCUTANEOUS
  Administered 2023-11-23: 2 [IU] via SUBCUTANEOUS
  Administered 2023-11-24: 3 [IU] via SUBCUTANEOUS
  Administered 2023-11-24: 2 [IU] via SUBCUTANEOUS
  Administered 2023-11-24 – 2023-11-25 (×2): 3 [IU] via SUBCUTANEOUS
  Administered 2023-11-25: 2 [IU] via SUBCUTANEOUS
  Administered 2023-11-25: 3 [IU] via SUBCUTANEOUS
  Administered 2023-11-26: 2 [IU] via SUBCUTANEOUS

## 2023-11-23 MED ORDER — FOLIC ACID 1 MG PO TABS
1.0000 mg | ORAL_TABLET | Freq: Every day | ORAL | Status: DC
  Administered 2023-11-23 – 2023-11-26 (×4): 1 mg via ORAL
  Filled 2023-11-23 (×4): qty 1

## 2023-11-23 NOTE — Evaluation (Signed)
Physical Therapy Evaluation Patient Details Name: Brady Church MRN: [id number] DOB: 03/20/1961 Today's Date: 11/23/2023  History of Present Illness  The pt is a 62 yo male presenting 12/26 after falling out of a chair, hypotensive upon arrival of EMS. PMH includes: polysubstance use (alcohol use disorder, cocaine use disorder), DM II, HTN, HLD, glaucoma, left MCA territory infarct, and legal blindness.   Clinical Impression  Pt in bed upon arrival of PT, agreeable to evaluation at this time. Prior to admission the pt was independent with use of cane (pt is blind), but reports no other recent falls or need for assistance. Pt is from shelter, and plans to return to shelter after d/c, reports he is feeling close to his personal baseline for mobility at this time. He did not need assistance for bed mobility or sit-stand, but had increased supervision with ambulation for safety. Will continue to follow acutely to progress endurance and dynamic stability to reduce risk of continued falls, anticipate no followup needs after d/c.     If plan is discharge home, recommend the following: A little help with walking and/or transfers;Assistance with cooking/housework;Help with stairs or ramp for entrance   Can travel by private vehicle        Equipment Recommendations None recommended by PT  Recommendations for Other Services       Functional Status Assessment Patient has had a recent decline in their functional status and demonstrates the ability to make significant improvements in function in a reasonable and predictable amount of time.     Precautions / Restrictions Precautions Precautions: Fall Precaution Comments: blind Restrictions Weight Bearing Restrictions Per Provider Order: No      Mobility  Bed Mobility Overal bed mobility: Modified Independent             General bed mobility comments: increased time, use of bed rail but no assist    Transfers Overall transfer level:  Needs assistance Equipment used: Straight cane Transfers: Sit to/from Stand Sit to Stand: Supervision           General transfer comment: supervision for safety, increased time and effort.    Ambulation/Gait Ambulation/Gait assistance: Supervision Gait Distance (Feet): 100 Feet Assistive device:  (blind cane) Gait Pattern/deviations: Step-through pattern Gait velocity: WFL, pt reports it is his baseline speed     General Gait Details: Steady gait with supervision only due to blindness and unfamiliar environment     Balance Overall balance assessment: Mild deficits observed, not formally tested                                           Pertinent Vitals/Pain      Home Living Family/patient expects to be discharged to:: Shelter/Homeless                        Prior Function Prior Level of Function : Independent/Modified Independent             Mobility Comments: Has a walking stick, no falls other than one that brought him to hospital ADLs Comments: Ind with ADL, takes public transportation to medical appointments, relies on shelter and agencies for food     Extremity/Trunk Assessment   Upper Extremity Assessment RUE Deficits / Details: prior CVA with residual fingertip numbness RUE Sensation: decreased light touch RUE Coordination: WNL    Lower Extremity Assessment Lower Extremity Assessment:  Generalized weakness (grossly 4/5, no changes to sensation, no buckling)    Cervical / Trunk Assessment Cervical / Trunk Assessment: Normal  Communication   Communication Communication: No apparent difficulties  Cognition Arousal: Alert Behavior During Therapy: WFL for tasks assessed/performed Overall Cognitive Status: Within Functional Limits for tasks assessed                                          General Comments General comments (skin integrity, edema, etc.): VSS, BP 135/80 (71) after ambulation         Assessment/Plan    PT Assessment Patient needs continued PT services  PT Problem List Decreased strength;Decreased activity tolerance;Decreased balance;Decreased mobility       PT Treatment Interventions DME instruction;Gait training;Stair training;Functional mobility training;Therapeutic activities;Therapeutic exercise;Balance training    PT Goals (Current goals can be found in the Care Plan section)  Acute Rehab PT Goals Patient Stated Goal: to return to independence PT Goal Formulation: With patient Time For Goal Achievement: 12/07/23 Potential to Achieve Goals: Good    Frequency Min 1X/week        AM-PAC PT "6 Clicks" Mobility  Outcome Measure Help needed turning from your back to your side while in a flat bed without using bedrails?: None Help needed moving from lying on your back to sitting on the side of a flat bed without using bedrails?: None Help needed moving to and from a bed to a chair (including a wheelchair)?: A Little Help needed standing up from a chair using your arms (e.g., wheelchair or bedside chair)?: A Little Help needed to walk in hospital room?: A Little Help needed climbing 3-5 steps with a railing? : A Little 6 Click Score: 20    End of Session Equipment Utilized During Treatment: Gait belt Activity Tolerance: Patient tolerated treatment well Patient left: in bed;with call bell/phone within reach;with bed alarm set Nurse Communication: Mobility status PT Visit Diagnosis: Other abnormalities of gait and mobility (R26.89)    Time: 4782-9562 PT Time Calculation (min) (ACUTE ONLY): 14 min   Charges:   PT Evaluation $PT Eval Moderate Complexity: 1 Mod   PT General Charges $$ ACUTE PT VISIT: 1 Visit         Vickki Muff, PT, DPT   Acute Rehabilitation Department Office [phone] Secure Chat Communication Preferred  Ronnie Derby 11/23/2023, 3:38 PM

## 2023-11-23 NOTE — Progress Notes (Addendum)
HD#0 SUBJECTIVE:  Patient Summary: Mr. Brady Church is a 62 year old male with a history of combined systolic and diastolic heart failure, uncontrolled type 2 diabetes, prior cerebral infarction, intracranial stenosis, polysubstance use disorder who [facility] after losing consciousness   Overnight Events: NAEO   Interim History: Patient was evaluated at bedside in ED.  Denies any shortness of breath, chest pain, troubles breathing, shaking, or any other signs or symptoms.  OBJECTIVE:  Vital Signs: Vitals:   11/23/23 0700 11/23/23 0803 11/23/23 0812 11/23/23 0845  BP: (!) 148/92     Pulse: (!) 53  (!) 55   Resp: 11     Temp:    (!) 97.5 F (36.4 C)  TempSrc:    Oral  SpO2: 100% 100%    Weight:      Height:       Supplemental O2: Room Air SpO2: 100 %  Filed Weights   11/22/23 2316  Weight: 97.5 kg     Intake/Output Summary (Last 24 hours) at 11/23/2023 0941 Last data filed at 11/23/2023 0425 Gross per 24 hour  Intake --  Output 400 ml  Net -400 ml   Net IO Since Admission: -400 mL [11/23/23 0941]  CBC    Component Value Date/Time   WBC 5.5 11/23/2023 0648   RBC 4.45 11/23/2023 0648   HGB 14.3 11/23/2023 0648   HGB 13.6 08/06/2019 1036   HCT 41.1 11/23/2023 0648   HCT 38.1 08/06/2019 1036   PLT 294 11/23/2023 0648   PLT 227 08/06/2019 1036   MCV 92.4 11/23/2023 0648   MCV 94 08/06/2019 1036   MCH 32.1 11/23/2023 0648   MCHC 34.8 11/23/2023 0648   RDW 14.6 11/23/2023 0648   RDW 14.1 08/06/2019 1036   LYMPHSABS 1.6 11/23/2023 0648   MONOABS 0.4 11/23/2023 0648   EOSABS 0.2 11/23/2023 0648   BASOSABS 0.0 11/23/2023 0648   CMP     Component Value Date/Time   NA 136 11/23/2023 0648   NA 140 11/07/2023 0922   K 4.3 11/23/2023 0648   CL 102 11/23/2023 0648   CO2 28 11/23/2023 0648   GLUCOSE 139 (H) 11/23/2023 0648   BUN 13 11/23/2023 0648   BUN 18 11/07/2023 0922   CREATININE 1.06 11/23/2023 0648    CREATININE 0.93 01/07/2015 1039   CALCIUM 8.7 (L) 11/23/2023 0648   PROT 7.0 11/23/2023 0648   PROT 7.3 07/05/2022 1034   ALBUMIN 3.7 11/23/2023 0648   ALBUMIN 4.4 07/05/2022 1034   AST 46 (H) 11/23/2023 0648   ALT 29 11/23/2023 0648   ALKPHOS 51 11/23/2023 0648   BILITOT 0.9 11/23/2023 0648   BILITOT 0.3 07/05/2022 1034   EGFR 80 11/07/2023 0922   GFRNONAA >60 11/23/2023 0648   GFRNONAA >89 01/07/2015 1039    Physical Exam: Physical Exam Constitutional:      Appearance: Normal appearance.  HENT:     Head: Normocephalic and atraumatic.  Eyes:     Conjunctiva/sclera:     Right eye: Right conjunctiva is injected.     Left eye: Left conjunctiva is injected.  Cardiovascular:     Rate and Rhythm: Normal rate and regular rhythm.  Pulmonary:     Effort: Pulmonary effort is normal.     Breath sounds: Normal breath sounds.  Neurological:     Mental Status: He is alert.    ASSESSMENT/PLAN:  Assessment: Principal Problem:   Syncope Active Problems:   Alcohol use disorder, severe,  dependence (HCC)   Diabetes mellitus with diabetic cataract (HCC)   Chronic combined systolic and diastolic congestive heart failure (HCC)   Intracranial vascular stenosis   Prolonged Q-T interval on ECG   CVA (cerebral vascular accident) (HCC)   Visual impairment   Plan: #Syncope Episode  #Hypotension #Prolonged QTc Etiology remains unknown but may be due to vasodepressor reflex vs alcohol withdrawal.  Since admission, he has remained hemodynamically stable. Last CIWA was 1.  Will follow-up with TTE to rule out cardiac etiology.  Due to prolonged QT, also avoid QT prolonging medications.  Low concern for withdrawal at this moment. - Continue CIWA - Follow-up TTE - Avoid QT prolonging medications - Follow-up PT/OT  #Elevated LFTs #Alcohol use disorder Likely in the context of alcohol use disorder.  Will follow-up with right upper quadrant ultrasound for further characterization. - Follow-up  right upper quadrant ultrasound - Follow-up hepatitis labs   #AKI #Hypokalemia, resolved Potassium improved to 4.3 this morning.  Creatinine also improved from 1.28 to 1.06.  Will continue to trend BMP. - Follow-up BMP  #Chronic combined systolic and diastolic congestive heart failure  We will continue to hold antihypertensives due to AKI and hypotension.  May consider resuming with stabilization of blood pressures.  #Type 2 Diabetes Mellitus  Continue SSI and trend CGM.  Best Practice: Diet: Cardiac diet VTE: enoxaparin (LOVENOX) injection 40 mg Start: 11/23/23 1000 Code: Full Therapy Recs:  TBD DISPO: Anticipated discharge tomorrow to Home pending  medication management .  Signature: Morrie Sheldon, MD Internal Medicine Resident, PGY-1 [facility]  Pager: [phone]  Please contact the on call pager after 5 pm and on weekends at [phone].

## 2023-11-23 NOTE — Discharge Instructions (Signed)

## 2023-11-23 NOTE — H&P (Addendum)
Date: 11/23/2023         Patient Name:  Brady Church MRN: [id number]  DOB: 1961-10-10 Age / Sex: 62 y.o., male   PCP: [name], MD         Medical Service: Internal Medicine Teaching Service         Attending Physician: Dr. Ginnie Smart, MD    First Contact: Dr. Morrie Sheldon, MD Pager [phone] Pager: [phone]  Second Contact:  Dr. Olegario Messier, MD Pager [phone] Pager: [phone]       After Hours (After 5p/  First Contact Pager: [phone]  weekends / holidays): Second Contact Pager: [phone]   Chief Concern: Sycope  History of Present Illness:  Mr. Brady Church is a 62 year old male with a history of combined systolic and diastolic heart failure, uncontrolled type 2 diabetes, prior cerebral infarction, intracranial stenosis, polysubstance use disorder who [facility] after losing consciousness   Per witnesses, patient was sitting in a chair and all of a sudden collapsed. Per patient, he felt warm and then passed out. No other symptoms prior to the episode. He does not remember losing consciousness, tongue biting, or involuntarily peeing. EMS reported patient looked a bit drunk when they arrived. Patient reports that , for the past week or so, he has had multiple shots of brandy and drunk 15- 18-beers/day. He also reports smoking cocaine, though only a small amount. Has not been eating much during this time. The last time he took his medications was on Wednesday morning, however, he has been peeing in large volumes. The last time he urinated was yesterday before the event. Has not felt dizzy or lightheaded, though he does not get "drunk symptoms" when drinking large amounts of alcohol.  Denies any chest pain, shortness of breath, dizziness, fever or cough before or after the syncopal episode. No difficulty or pain with urination.  Of note, this patient was last admitted on 10/01/23 for acute heart failure with reduced ejection  fraction in the setting of cocaine use and medication non adherence due to visual impairment and housing instability. He also follows up with the heart failure clinic and was last seen on 11/01/2023 and his spironolactone was increased to 25 mg daily. He get his medications from the [facility] where he fills his pill box every tuesday  Allergies: No Known Allergies  Past Medical History: Patient Active Problem List   Diagnosis Date Noted   Syncope 11/23/2023   Visual impairment 10/23/2023   Nephrolithiasis 08/30/2023   Housing instability 08/24/2022   Neuropathic pain of hand, right 01/11/2022   Bilateral foot pain 07/08/2021   Vitamin B12 deficiency 08/06/2019   Erectile dysfunction 05/06/2018   Post-nasal drip 10/01/2017   Peripheral neuropathy 08/06/2017   Adjustment disorder with mixed anxiety and depressed mood    CVA (cerebral vascular accident) (HCC) 02/19/2017   Intracranial vascular stenosis 02/16/2017   Prolonged Q-T interval on ECG 02/16/2017   Cocaine use disorder, severe, dependence (HCC)    Chronic combined systolic and diastolic congestive heart failure (HCC) 05/12/2015   Alcohol use disorder, severe, dependence (HCC) 11/06/2014   History of tobacco use 10/07/2014   Healthcare maintenance 01/23/2014   Glaucoma 05/11/2012   Hyperlipidemia LDL goal <70 03/22/2012   Hypertension 01/17/2012   Diabetes mellitus with diabetic cataract (HCC) 03/08/2009   Past Medical History:  Diagnosis Date   Alcohol use disorder, severe, dependence (HCC) 11/06/2014   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 Stopped in 2010   Tobacco use disorder 10/07/2014    Medications: Asprin  81 mg Lipitor 80 mg Alphagan 0.15% Coreg 3.125 Synjardy  5-100 mg Fluorometholone Lasix 40 mg Spironolactone 25 mg  Losartan 25 mg  Multivitamin  rhopressa 0.02 % soln rocklatan 0.02-0.005 % soln simbrinza 1-0.2 % susp timoptic 0.5 % ophthalmic solution  Surgical History: Past Surgical History:  Procedure Laterality Date   CATARACT EXTRACTION  01/08/2012   Right eye   EYE SURGERY     x 4 total     Family History:  Family History  Problem Relation Age of Onset   Coronary artery disease Mother 63       died of MI   Cancer Father 47       Some GI cancer. Not sure if it was Colon Cancer or not.   Hypertension Father    Diabetes Maternal Grandmother     Social History:  Social History   Socioeconomic History   Marital status: Single    Spouse name: Not on file   Number of children: Not on file   Years of education: 12   Highest education level: Not on file  Occupational History   Occupation: Not working  Tobacco Use   Smoking status: Former    Current packs/day: 0.00    Types: Cigarettes    Quit date: 02/18/2017    Years since quitting: 6.7   Smokeless tobacco: Never  Vaping Use   Vaping status: Never Used  Substance and Sexual Activity   Alcohol use: Yes    Comment: Beer- 40 oz. every day.   Drug use: Not Currently    Types: Cocaine, Marijuana   Sexual activity: Not on file  Other Topics Concern   Not on file  Social History Narrative   Lives in [place] for last 10 years with "friend"  [name]      Is not married and does not have kids.   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   Social Drivers of Corporate investment banker Strain: High Risk (10/02/2023)   Overall Financial Resource Strain (CARDIA)    Difficulty of Paying Living Expenses: Very hard  Food Insecurity: Food Insecurity Present (10/01/2023)   Hunger Vital Sign    Worried About Running Out of Food in the Last Year: Often true    Ran Out of Food in the Last Year: Often  true  Transportation Needs: Unmet Transportation Needs (10/01/2023)   PRAPARE - Administrator, Civil Service (Medical): Yes    Lack of Transportation (Non-Medical): Yes  Physical Activity: Not on file  Stress: Not on file  Social Connections: Socially Isolated (10/11/2022)   Social Connection and Isolation Panel [NHANES]    Frequency of Communication with Friends and Family: Never    Frequency of Social Gatherings with Friends and Family: Never    Attends Religious Services: Never    Database administrator or Organizations: No    Attends Banker Meetings: Never    Marital Status: Never married  Intimate Partner Violence: Not At Risk (10/01/2023)  Humiliation, Afraid, Rape, and Kick questionnaire    Fear of Current or Ex-Partner: No    Emotionally Abused: No    Physically Abused: No    Sexually Abused: No     Physical Exam: Blood pressure (!) 151/104, pulse 73, temperature 98.2 F (36.8 C), temperature source Oral, resp. rate 17, height 5\' 8"  (1.727 m), weight 97.5 kg, SpO2 100%.  CONSTITUTIONAL:  Sleepy , laying in bed, in no acute distress NEURO/PSYCH: Normal mood and affect, mildly unintelligible speech EYES: Pupils reactive to light., erythematous sclera Neck; supple, unable to see JVP CARDIO:  Regular RR. No murmurs appreciated. Radial pulses symmetric. DP pulses present and symmetric PULM: On RA.  No use of accessory muscles. CTAB GI/GU: Mildly distended, no angiomata or varices noted. No fluid wave. No tremors.   No guarding or rebound tenderness. MSK:No lower extremity edema noted. SKIN: Normal skin pigmentation.  Warm and dry.  Decreased skin turgor.  EKG:  Normal Sinus with prolonged Qtc of 553 with non specific inverted T waves .  Labs:    Latest Ref Rng & Units 11/22/2023   11:23 PM 10/02/2023    5:25 AM 10/01/2023    2:26 AM  CBC  WBC 4.0 - 10.5 K/uL 5.6  5.7    Hemoglobin 13.0 - 17.0 g/dL 28.4  13.2  44.0   Hematocrit 39.0 - 52.0 %  44.1  40.3  39.0   Platelets 150 - 400 K/uL 252  252         Latest Ref Rng & Units 11/22/2023   11:23 PM 11/07/2023    9:22 AM 10/15/2023    1:15 PM  CMP  Glucose 70 - 99 mg/dL 102  725  366   BUN 8 - 23 mg/dL 13  18  19    Creatinine 0.61 - 1.24 mg/dL 4.40  3.47  4.25   Sodium 135 - 145 mmol/L 135  140  136   Potassium 3.5 - 5.1 mmol/L 3.1  3.8  3.3   Chloride 98 - 111 mmol/L 101  99  100   CO2 22 - 32 mmol/L 20  25  26    Calcium 8.9 - 10.3 mg/dL 8.5  9.8  9.0   Total Protein 6.5 - 8.1 g/dL 7.0     Total Bilirubin <1.2 mg/dL 0.6     Alkaline Phos 38 - 126 U/L 52     AST 15 - 41 U/L 47     ALT 0 - 44 U/L 28       BNP 147.9 Ethanol of 24 INR 1 PTT 13.8 Lactic Acid 1.6  Images and other studies: CXR- Showed no active disease   Assessment & Plan:  Brady Church is a 62 y.o. Alcohol use disorder, combined systolic and diastolic heart failure and type 2 diabetes who presented after a syncopal episode, found to have an AKI and hypokalemia and admitted for syncopal work up.   Principal Problem:   Syncope Active Problems:   Alcohol use disorder, severe, dependence (HCC)   Diabetes mellitus with diabetic cataract (HCC)   Chronic combined systolic and diastolic congestive heart failure (HCC)   Intracranial vascular stenosis   Prolonged Q-T interval on ECG   CVA (cerebral vascular accident) (HCC)   Visual impairment  #Syncope Episode  #Hypotension #Prolonged QTc Presents after a syncopal episode after feeling warm but otherwise no prodromal symptoms noted. Found hypotensive at 88/50 followed by 62/40. 500ccNS admin and 4mg  Zofran with (+) improvement in VS 120/70. Possible etiologies  include cardiogenic, orthostatic, hypovolemic. CBC unremarkable on arrival. Denies trauma, chest pain , feeling anxious ,vertigo or dizziness prior to the episode. No focal neurological findings. Trops unremarkable, Neuro exam reassuring ,low concern for ischemic or acute neurological emergency.  Polysubstance use likely contributing to this presentation. Ethanol level on arrival of 24. Suspect this syncope is likely due to vasodepressor reflex ,leading to bradycardia and systemic hypotension  in the setting of alcohol use. - Orthostatics  - PT / OT - EEG - Telemetry monitoring  - Complete TTE - Avoid QT prolonging medications  #Alcohol use disorder, severe, dependence  # Polysubstance use disorder Hx of polysubstance use . Last alcohol use was yesterday, had about 18 cans  of beer daily .  Also reports cocaine use during this time. Been abstaining for the past 2 months but relapsed due to life stressors of homelessness  - CIWA - Continue to offer substance abuse counseling - Thiamine supplementation  #AKI #Hypokalemia  Cr mildly elevated at 1.28 likely prerenal in the setting of alcohol use per above. Denies any urinary symptoms. Will trend Cr. Potassium of 3.1 on admission, .Hyaline cast on UA , likely due to dehydration .No muscle cramps. Will replete, recheck BMP and do a bladder scan to assess for urinary retention. - Follow up bladder scan, renal ultrasound  # Elevated Liver Enzymes Likely in the setting of chronic alcohol use. No abdominal pain. Fib score 2.15, further investigation needed. - RUQ U/S - Hepatitis panel follow up - if negative, patient would benefit from immunization  #Chronic combined systolic and diastolic congestive heart failure   Follows with with Heart failure. Last ECHO 2016 ,showed EF of 35-40 % > 65-70 % in 2018 and went back down to 35-40 in 2018. Patient is currently on Lasix ,spironolactone and Coreg.  No signs of acute heart failure exacerbation on exam. - Holding antihypertensives in the setting of hypotension and mild AKI - Resume GDMT as able  #Diabetes mellitus with diabetic cataract On Synjardy BID. Hgb A1c on 10/16/23 was 11.0 , uncontrolled ,Glucosuria on UA .Wonder if he is taking his mediation  - SSI - CGM  Hx of CVA Hx  intracranial stenosis Seen on CTA head and neck on 2018. Likely worsened since given uncontrolled metabolic disease.  -Continue on statin -Will hold ASA until renal function has improved  # Legal blindness # Hx of cataract surgery  -restart home rhopressa 0.02 % soln, rocklatan 0.02-0.005 % soln, simbrinza 1-0.2 % susp, timolol  0.5 % ophthalmic solution   Level of care: Telemetry and observation Diet: Normal diet IVF: None applicable VTE: enoxaparin (LOVENOX) injection 40 mg Start: 11/23/23 1000 Code: Full code  Signed: Kathleen Lime, MD 11/23/2023, 5:37 AM

## 2023-11-23 NOTE — Evaluation (Signed)
Occupational Therapy Evaluation Patient Details Name: Brady Church MRN: [id number] DOB: [birth year] Today's Date: 11/23/2023   History of Present Illness 62 yo male with a PMH of polysubstance use (alcohol use disorder, cocaine use disorder), type 2 diabetes, hyperlipidemia, hypertension, glaucoma, left MCA territory infarct, and legal blindness adm 12/26 for syncopal event.   Clinical Impression   Patient admitted for the diagnosis above, PTA he lives at a local shelter, maintains his independence despite legal blindness.  Patient is essentially at his baseline, but OT will follow for visit next week to ensure safe discharge home.  No post acute OT needed.         If plan is discharge home, recommend the following: Assist for transportation    Functional Status Assessment  Patient has had a recent decline in their functional status and demonstrates the ability to make significant improvements in function in a reasonable and predictable amount of time.  Equipment Recommendations  None recommended by OT    Recommendations for Other Services       Precautions / Restrictions Precautions Precautions: Fall Restrictions Weight Bearing Restrictions Per Provider Order: No      Mobility Bed Mobility Overal bed mobility: Modified Independent                  Transfers Overall transfer level: Needs assistance   Transfers: Sit to/from Stand, Bed to chair/wheelchair/BSC Sit to Stand: Modified independent (Device/Increase time)     Step pivot transfers: Supervision            Balance Overall balance assessment: Mild deficits observed, not formally tested                                         ADL either performed or assessed with clinical judgement   ADL                                         General ADL Comments: Generalized supervision due to new environment     Vision Baseline Vision/History: 2 Legally blind Patient  Visual Report: No change from baseline       Perception Perception: Not tested       Praxis Praxis: Not tested       Pertinent Vitals/Pain Pain Assessment Pain Assessment: No/denies pain     Extremity/Trunk Assessment Upper Extremity Assessment Upper Extremity Assessment: Overall WFL for tasks assessed;RUE deficits/detail RUE Deficits / Details: prior CVA with residual fingertip numbness RUE Sensation: decreased light touch RUE Coordination: WNL   Lower Extremity Assessment Lower Extremity Assessment: Defer to PT evaluation   Cervical / Trunk Assessment Cervical / Trunk Assessment: Normal   Communication Communication Communication: No apparent difficulties   Cognition Arousal: Alert Behavior During Therapy: WFL for tasks assessed/performed Overall Cognitive Status: Within Functional Limits for tasks assessed                                       General Comments   VSS    Exercises     Shoulder Instructions      Home Living Family/patient expects to be discharged to:: Shelter/Homeless  Prior Functioning/Environment Prior Level of Function : Independent/Modified Independent             Mobility Comments: Has a walking stick ADLs Comments: Ind with ADL, takes public transportation to medical appointments, relies on shelter and agencies for food        OT Problem List: Impaired balance (sitting and/or standing)      OT Treatment/Interventions: Self-care/ADL training;Therapeutic activities    OT Goals(Current goals can be found in the care plan section) Acute Rehab OT Goals Patient Stated Goal: Find a stable place to live OT Goal Formulation: With patient Time For Goal Achievement: 12/07/23 Potential to Achieve Goals: Good ADL Goals Pt Will Perform Grooming: Independently;standing Pt Will Perform Lower Body Dressing: Independently;sit to/from stand Pt Will Transfer to Toilet:  Independently;regular height toilet;ambulating  OT Frequency: Min 1X/week    Co-evaluation              AM-PAC OT "6 Clicks" Daily Activity     Outcome Measure Help from another person eating meals?: None Help from another person taking care of personal grooming?: None Help from another person toileting, which includes using toliet, bedpan, or urinal?: A Little Help from another person bathing (including washing, rinsing, drying)?: A Little Help from another person to put on and taking off regular upper body clothing?: None Help from another person to put on and taking off regular lower body clothing?: A Little 6 Click Score: 21   End of Session Equipment Utilized During Treatment: Other (comment) (walking stick) Nurse Communication: Mobility status  Activity Tolerance: Patient tolerated treatment well Patient left: in bed;with call bell/phone within reach  OT Visit Diagnosis: Unsteadiness on feet (R26.81)                Time: 1020-1039 OT Time Calculation (min): 19 min Charges:  OT General Charges $OT Visit: 1 Visit OT Evaluation $OT Eval Moderate Complexity: 1 Mod  11/23/2023  RP, OTR/L  Acute Rehabilitation Services  Office:  [phone]   Suzanna Obey 11/23/2023, 10:44 AM

## 2023-11-23 NOTE — Progress Notes (Signed)
CSW added substance abuse resources to patient's AVS.  Burnham Trost, MSW, LCSW Transitions of Care  Clinical Social Worker II [phone]  

## 2023-11-23 NOTE — ED Notes (Signed)
ED TO INPATIENT HANDOFF REPORT  ED Nurse Name and Phone #: Topher [phone]  S Name/Age/Gender Brady Church 62 y.o. male Room/Bed: 007C/007C  Code Status   Code Status: Full Code  Home/SNF/Other Home Patient oriented to: self, place, time, and situation Is this baseline? Yes   Triage Complete: Triage complete  Chief Complaint Syncope [R55]  Triage Note Arrives [facility] from [employer].   Staff made notification with 911 after pt fell from a chair. Patient admits that he just "felt hot" prior to syncopal episode.   Upon paramedic arrival pt found hypotensive at 88/50 followed by 62/40. 500ccNS admin and 4mg  zofran with (+) improvement in VS 120/70.   Has been drinking heavily 2 days ago.    Allergies No Known Allergies  Level of Care/Admitting Diagnosis ED Disposition     ED Disposition  Admit   Condition  --   Comment  Hospital Area: [facility] [[id number]]  Level of Care: Telemetry Medical [104]  May place patient in observation at [facility] or [facility] if equivalent level of care is available:: No  Covid Evaluation: Asymptomatic - no recent exposure (last 10 days) testing not required  Diagnosis: Syncope [206001]  Admitting Physician: Ginnie Smart [[id number]  Attending Physician: HATCHER, JEFFREY C [[id number]]          B Medical/Surgery History Past Medical History:  Diagnosis Date   Alcohol use disorder, severe, dependence (HCC) 11/06/2014   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 Stopped in 2010   Tobacco use disorder 10/07/2014   Past Surgical History:  Procedure Laterality Date   CATARACT EXTRACTION  01/08/2012   Right eye   EYE SURGERY     x 4 total      A IV  Location/Drains/Wounds Patient Lines/Drains/Airways Status     Active Line/Drains/Airways     Name Placement date Placement time Site Days   Peripheral IV 11/22/23 18 G 1.16" Left Antecubital 11/22/23  2314  Antecubital  1   Peripheral IV 11/22/23 18 G 1.16" Right Antecubital 11/22/23  2314  Antecubital  1            Intake/Output Last 24 hours  Intake/Output Summary (Last 24 hours) at 11/23/2023 1220 Last data filed at 11/23/2023 0425 Gross per 24 hour  Intake --  Output 400 ml  Net -400 ml    Labs/Imaging Results for orders placed or performed during the hospital encounter of 11/22/23 (from the past 48 hours)  Brain natriuretic peptide     Status: Abnormal   Collection Time: 11/22/23 11:21 PM  Result Value Ref Range   B Natriuretic Peptide 147.9 (H) 0.0 - 100.0 pg/mL    Comment: Performed at [facility], [address] [place], [place] [address]  Resp panel by RT-PCR (RSV, Flu A&B, Covid) Anterior Nasal Swab     Status: None   Collection Time: 11/22/23 11:22 PM   Specimen: Anterior Nasal Swab  Result Value Ref Range   SARS Coronavirus 2 by RT PCR NEGATIVE NEGATIVE   Influenza A by PCR NEGATIVE NEGATIVE   Influenza B by PCR NEGATIVE NEGATIVE    Comment: (NOTE) The Xpert Xpress SARS-CoV-2/FLU/RSV plus assay is intended  as an aid in the diagnosis of influenza from Nasopharyngeal swab specimens and should not be used as a sole basis for treatment. Nasal washings and aspirates are unacceptable for Xpert Xpress SARS-CoV-2/FLU/RSV testing.  Fact Sheet for Patients: BloggerCourse.com  Fact Sheet for Healthcare Providers: SeriousBroker.it  This test is not yet approved or cleared by the [place] FDA and has been authorized for detection and/or diagnosis of SARS-CoV-2 by FDA under an Emergency Use Authorization (EUA). This EUA will remain in effect (meaning this test can be used) for the duration of  the COVID-19 declaration under Section 564(b)(1) of the Act, 21 U.S.C. section 360bbb-3(b)(1), unless the authorization is terminated or revoked.     Resp Syncytial Virus by PCR NEGATIVE NEGATIVE    Comment: (NOTE) Fact Sheet for Patients: BloggerCourse.com  Fact Sheet for Healthcare Providers: SeriousBroker.it  This test is not yet approved or cleared by the [place] FDA and has been authorized for detection and/or diagnosis of SARS-CoV-2 by FDA under an Emergency Use Authorization (EUA). This EUA will remain in effect (meaning this test can be used) for the duration of the COVID-19 declaration under Section 564(b)(1) of the Act, 21 U.S.C. section 360bbb-3(b)(1), unless the authorization is terminated or revoked.  Performed at [facility], [address], [place], [place] [address]   CBC     Status: None   Collection Time: 11/22/23 11:23 PM  Result Value Ref Range   WBC 5.6 4.0 - 10.5 K/uL   RBC 4.54 4.22 - 5.81 MIL/uL   Hemoglobin 14.6 13.0 - 17.0 g/dL   HCT 64.4 03.4 - 74.2 %   MCV 97.1 80.0 - 100.0 fL   MCH 32.2 26.0 - 34.0 pg   MCHC 33.1 30.0 - 36.0 g/dL   RDW 59.5 63.8 - 75.6 %   Platelets 252 150 - 400 K/uL   nRBC 0.0 0.0 - 0.2 %    Comment: Performed at [facility], [address] [place], [place] [address]  Comprehensive metabolic panel     Status: Abnormal   Collection Time: 11/22/23 11:23 PM  Result Value Ref Range   Sodium 135 135 - 145 mmol/L   Potassium 3.1 (L) 3.5 - 5.1 mmol/L   Chloride 101 98 - 111 mmol/L   CO2 20 (L) 22 - 32 mmol/L   Glucose, Bld 169 (H) 70 - 99 mg/dL    Comment: Glucose reference range applies only to samples taken after fasting for at least 8 hours.   BUN 13 8 - 23 mg/dL   Creatinine, Ser 5.18 (H) 0.61 - 1.24 mg/dL   Calcium 8.5 (L) 8.9 - 10.3 mg/dL   Total Protein 7.0 6.5 - 8.1 g/dL   Albumin 3.8 3.5 - 5.0 g/dL   AST 47 (H) 15 - 41 U/L   ALT 28 0 - 44 U/L    Alkaline Phosphatase 52 38 - 126 U/L   Total Bilirubin 0.6 <1.2 mg/dL   GFR, Estimated >84 >16 mL/min    Comment: (NOTE) Calculated using the CKD-EPI Creatinine Equation (2021)    Anion gap 14 5 - 15    Comment: Performed at [facility], [address] [place], [place] [address]  Troponin I (High Sensitivity)     Status: Abnormal   Collection Time: 11/22/23 11:23 PM  Result Value Ref Range   Troponin I (High Sensitivity) 33 (H) <18 ng/L    Comment: (NOTE) Elevated high sensitivity troponin I (hsTnI) values and significant  changes across serial measurements may suggest ACS but many other  chronic and acute conditions are known to elevate hsTnI results.  Refer to the "Links" section for chest pain algorithms and additional  guidance. Performed at [facility], [address], [place], [place] [address]   Protime-INR     Status: None   Collection Time: 11/22/23 11:23 PM  Result Value Ref Range   Prothrombin Time 13.8 11.4 - 15.2 seconds   INR 1.0 0.8 - 1.2    Comment: (NOTE) INR goal varies based on device and disease states. Performed at [facility], [address] [place], [place] [address]   Ethanol     Status: Abnormal   Collection Time: 11/22/23 11:23 PM  Result Value Ref Range   Alcohol, Ethyl (B) 24 (H) <10 mg/dL    Comment: (NOTE) Lowest detectable limit for serum alcohol is 10 mg/dL.  For medical purposes only. Performed at [facility], [address], [place], [place] [address]   Troponin I (High Sensitivity)     Status: Abnormal   Collection Time: 11/23/23  2:08 AM  Result Value Ref Range   Troponin I (High Sensitivity) 20 (H) <18 ng/L    Comment: (NOTE) Elevated high sensitivity troponin I (hsTnI) values and significant  changes across serial measurements may suggest ACS but many other  chronic and acute conditions are known to elevate hsTnI results.  Refer to the "Links" section for chest pain algorithms and additional   guidance. Performed at [facility], [address], [place], [place] [address]   Urinalysis, Routine w reflex microscopic -Urine, Clean Catch     Status: Abnormal   Collection Time: 11/23/23  4:00 AM  Result Value Ref Range   Color, Urine YELLOW YELLOW   APPearance HAZY (A) CLEAR   Specific Gravity, Urine 1.023 1.005 - 1.030   pH 5.0 5.0 - 8.0   Glucose, UA >=500 (A) NEGATIVE mg/dL   Hgb urine dipstick NEGATIVE NEGATIVE   Bilirubin Urine NEGATIVE NEGATIVE   Ketones, ur NEGATIVE NEGATIVE mg/dL   Protein, ur 440 (A) NEGATIVE mg/dL   Nitrite NEGATIVE NEGATIVE   Leukocytes,Ua NEGATIVE NEGATIVE   RBC / HPF 0-5 0 - 5 RBC/hpf   WBC, UA 0-5 0 - 5 WBC/hpf   Bacteria, UA NONE SEEN NONE SEEN   Squamous Epithelial / HPF 0-5 0 - 5 /HPF   Mucus PRESENT    Hyaline Casts, UA PRESENT     Comment: Performed at [facility], [address], [place], [place] [address]  I-Stat CG4 Lactic Acid     Status: None   Collection Time: 11/23/23  4:12 AM  Result Value Ref Range   Lactic Acid, Venous 1.6 0.5 - 1.9 mmol/L  Comprehensive metabolic panel     Status: Abnormal   Collection Time: 11/23/23  6:48 AM  Result Value Ref Range   Sodium 136 135 - 145 mmol/L   Potassium 4.3 3.5 - 5.1 mmol/L   Chloride 102 98 - 111 mmol/L   CO2 28 22 - 32 mmol/L   Glucose, Bld 139 (H) 70 - 99 mg/dL    Comment: Glucose reference range applies only to samples taken after fasting for at least 8 hours.   BUN 13 8 - 23 mg/dL   Creatinine, Ser 5.36 0.61 - 1.24 mg/dL   Calcium 8.7 (L) 8.9 - 10.3 mg/dL   Total Protein 7.0 6.5 - 8.1 g/dL   Albumin 3.7  3.5 - 5.0 g/dL   AST 46 (H) 15 - 41 U/L   ALT 29 0 - 44 U/L   Alkaline Phosphatase 51 38 - 126 U/L   Total Bilirubin 0.9 <1.2 mg/dL   GFR, Estimated >29 >56 mL/min    Comment: (NOTE) Calculated using the CKD-EPI Creatinine Equation (2021)    Anion gap 6 5 - 15    Comment: Performed at [facility], [address], [place], [place] [address]  CBC with  Differential/Platelet     Status: None   Collection Time: 11/23/23  6:48 AM  Result Value Ref Range   WBC 5.5 4.0 - 10.5 K/uL   RBC 4.45 4.22 - 5.81 MIL/uL   Hemoglobin 14.3 13.0 - 17.0 g/dL   HCT 65.7 84.6 - 96.2 %   MCV 92.4 80.0 - 100.0 fL   MCH 32.1 26.0 - 34.0 pg   MCHC 34.8 30.0 - 36.0 g/dL   RDW 95.2 84.1 - 32.4 %   Platelets 294 150 - 400 K/uL   nRBC 0.0 0.0 - 0.2 %   Neutrophils Relative % 60 %   Neutro Abs 3.2 1.7 - 7.7 K/uL   Lymphocytes Relative 30 %   Lymphs Abs 1.6 0.7 - 4.0 K/uL   Monocytes Relative 7 %   Monocytes Absolute 0.4 0.1 - 1.0 K/uL   Eosinophils Relative 3 %   Eosinophils Absolute 0.2 0.0 - 0.5 K/uL   Basophils Relative 0 %   Basophils Absolute 0.0 0.0 - 0.1 K/uL   Immature Granulocytes 0 %   Abs Immature Granulocytes 0.01 0.00 - 0.07 K/uL    Comment: Performed at [facility], [address] [place], [place] [address]  Hepatitis B core antibody, IgM     Status: None   Collection Time: 11/23/23  6:48 AM  Result Value Ref Range   Hep B C IgM NON REACTIVE NON REACTIVE    Comment: Performed at [facility], [address], [place], [place] [address]  Hepatitis B surface antigen     Status: None   Collection Time: 11/23/23  6:48 AM  Result Value Ref Range   Hepatitis B Surface Ag NON REACTIVE NON REACTIVE    Comment: Performed at [facility], [address], [place], [place] [address]  Hepatitis B core antibody, total     Status: None   Collection Time: 11/23/23  6:48 AM  Result Value Ref Range   Hep B Core Total Ab NON REACTIVE NON REACTIVE    Comment: Performed at [facility], [address] [place], [place] [address]  Hepatitis A antibody, total     Status: None   Collection Time: 11/23/23  6:48 AM  Result Value Ref Range   hep A Total Ab NON REACTIVE NON REACTIVE    Comment: Performed at [facility], [address] [place], [place] [address]  CBG monitoring, ED     Status: Abnormal   Collection Time: 11/23/23   7:38 AM  Result Value Ref Range   Glucose-Capillary 123 (H) 70 - 99 mg/dL    Comment: Glucose reference range applies only to samples taken after fasting for at least 8 hours.  CBG monitoring, ED     Status: Abnormal   Collection Time: 11/23/23 11:49 AM  Result Value Ref Range   Glucose-Capillary 182 (H) 70 - 99 mg/dL    Comment: Glucose reference range applies only to samples taken after fasting for  at least 8 hours.   EEG adult Result Date: 11/23/2023 Charlsie Quest, MD     11/23/2023  7:18 AM Patient Name: Brady Church MRN: [id number] Epilepsy Attending: Charlsie Quest Referring Physician/Provider: Morene Crocker, MD Date: 11/23/2023 Duration: 25.07 mins Patient history: 62yo M with LOC getting eeg to evaluate for seizure Level of alertness: Awake, asleep AEDs during EEG study: None Technical aspects: This EEG study was done with scalp electrodes positioned according to the 10-20 International system of electrode placement. Electrical activity was reviewed with band pass filter of 1-70Hz , sensitivity of 7 uV/mm, display speed of 43mm/sec with a 60Hz  notched filter applied as appropriate. EEG data were recorded continuously and digitally stored.  Video monitoring was available and reviewed as appropriate. Description: The posterior dominant rhythm consists of 7.5 Hz activity of moderate voltage (25-35 uV) seen predominantly in posterior head regions, symmetric and reactive to eye opening and eye closing. Sleep was characterized by vertex waves, sleep spindles (12 to 14 Hz), maximal frontocentral region. Physiologic photic driving was not seen during photic stimulation.  Hyperventilation was not performed.   IMPRESSION: This study is within normal limits. No seizures or epileptiform discharges were seen throughout the recording. A normal interictal EEG does not exclude the diagnosis of epilepsy. Charlsie Quest   US RENAL Result Date: 11/23/2023 CLINICAL DATA:  62 year old male with  acute renal insufficiency. EXAM: RENAL / URINARY TRACT ULTRASOUND COMPLETE COMPARISON:  CT Abdomen and Pelvis 07/27/2023. FINDINGS: Right Kidney: Renal measurements: 11.0 x 5.6 x 5.8 cm = volume: 189 mL. Echogenic right renal cortex (image 5). Column of Bertin. No right hydronephrosis or renal mass. Left Kidney: Renal measurements: 11.3 x 6.4 x 6.6 cm = volume: 250 mL. Similar heterogeneous increased left renal cortex echogenicity (image 21). No left hydronephrosis or renal mass. Bladder: Diminutive, appears normal for degree of bladder distention. Other: Prostatomegaly, estimated prostate volume 44 mL. IMPRESSION: 1. Echogenic kidneys compatible with medical renal disease. No hydronephrosis or acute renal finding. 2. Diminutive bladder.  Prostatomegaly. Electronically Signed   By: Odessa Fleming M.D.   On: 11/23/2023 06:49   US Abdomen Limited RUQ (LIVER/GB) Result Date: 11/23/2023 CLINICAL DATA:  62 year old male with abnormal liver enzymes. EXAM: ULTRASOUND ABDOMEN LIMITED RIGHT UPPER QUADRANT COMPARISON:  CT Abdomen and Pelvis 07/27/2023. FINDINGS: Gallbladder: Partially contracted. No gallstones or wall thickening visualized. No sonographic Murphy sign noted by sonographer. Common bile duct: Diameter: 4 mm, normal. Liver: No focal lesion identified. Within normal limits in parenchymal echogenicity. Portal vein is patent on color Doppler imaging with normal direction of blood flow towards the liver. Other: Negative visible right kidney.  No free fluid. IMPRESSION: Normal right upper quadrant ultrasound. Electronically Signed   By: Odessa Fleming M.D.   On: 11/23/2023 05:13   DG Chest Port 1 View Result Date: 11/22/2023 CLINICAL DATA:  Syncope. EXAM: PORTABLE CHEST 1 VIEW COMPARISON:  October 01, 2023 FINDINGS: The heart size and mediastinal contours are within normal limits. Both lungs are clear. Multilevel degenerative changes are noted throughout the thoracic spine. IMPRESSION: No active disease. Electronically  Signed   By: Aram Candela M.D.   On: 11/22/2023 23:35    Pending Labs Unresulted Labs (From admission, onward)     Start     Ordered   11/23/23 0550  HCV Ab w Reflex to Quant PCR  Once,   R        11/23/23 0549   11/23/23 0549  Hepatitis B surface antibody,quantitative  Once,  R        11/23/23 0549   11/22/23 2327  Rapid urine drug screen (hospital performed)  ONCE - STAT,   STAT        11/22/23 2326            Vitals/Pain Today's Vitals   11/23/23 0803 11/23/23 0812 11/23/23 0845 11/23/23 1000  BP:    (!) 146/99  Pulse:  (!) 55  (!) 58  Resp:    14  Temp:   (!) 97.5 F (36.4 C)   TempSrc:   Oral   SpO2: 100%   100%  Weight:      Height:      PainSc:        Isolation Precautions No active isolations  Medications Medications  atorvastatin (LIPITOR) tablet 80 mg (has no administration in time range)  sodium chloride flush (NS) 0.9 % injection 3 mL (3 mLs Intravenous Given 11/23/23 1011)  enoxaparin (LOVENOX) injection 40 mg (40 mg Subcutaneous Given 11/23/23 1010)  insulin aspart (novoLOG) injection 0-15 Units (3 Units Subcutaneous Given 11/23/23 1205)  insulin aspart (novoLOG) injection 0-5 Units (has no administration in time range)  thiamine (VITAMIN B1) tablet 100 mg (100 mg Oral Given 11/23/23 1009)    Or  thiamine (VITAMIN B1) injection 100 mg ( Intravenous See Alternative 11/23/23 1009)  folic acid (FOLVITE) tablet 1 mg (1 mg Oral Given 11/23/23 1010)  multivitamin with minerals tablet 1 tablet (1 tablet Oral Given 11/23/23 1009)  Netarsudil-Latanoprost 0.02-0.005 % SOLN 1 drop (has no administration in time range)  timolol (TIMOPTIC) 0.5 % ophthalmic solution 1 drop (1 drop Right Eye Given 11/23/23 1010)  influenza vaccine adjuvanted (FLUAD) injection 0.5 mL (has no administration in time range)  potassium chloride SA (KLOR-CON M) CR tablet 40 mEq (40 mEq Oral Given 11/23/23 0756)    And  potassium chloride SA (KLOR-CON M) CR tablet 40 mEq (40 mEq  Oral Given 11/23/23 0443)    Mobility walks with device     Focused Assessments Neuro Assessment Handoff:  Swallow screen pass? Yes  Cardiac Rhythm: Normal sinus rhythm       Neuro Assessment: Exceptions to WDL Neuro Checks:      Has TPA been given? 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

## 2023-11-23 NOTE — Progress Notes (Signed)
Echocardiogram 2D Echocardiogram has been performed.  Brady Church 11/23/2023, 12:30 PM

## 2023-11-23 NOTE — ED Notes (Signed)
Pt denies chest pain- SOB-

## 2023-11-23 NOTE — Hospital Course (Addendum)
#  Syncope Episode  #Hypotension #Hypokalemia #AKI #Prolonged Qtc Prior to admission, he was found hypotensive upon EMS arrival.  His potassium was 3.1.  These are likely in the context of dehydration.  He was given bolus of fluids, and his hypokalemia and AKI resolved.  He also received an echo to rule out cardiac etiology that showed ***.  Recommend follow-up outpatient.  # Alcohol use disorder #Polysubstance use disorder Last alcohol use was 1 day prior to admission.  He was placed on CIWA protocol, but his CIWA remained low around 1.  He was provided thiamine supplementation.  Hepatitis labs were also sent and that showed ***.  He received a right upper quadrant ultrasound that showed ***.  Recommend follow-up outpatient.  #Chronic combined systolic and diastolic congestive heart failure  Antihypertensives initially held due to AKI and hypotension.  #Type 2 Diabetes Mellitus  Continued on SSI.   Been without meds x 5 days Homeless now got gicked out Low food/drink

## 2023-11-23 NOTE — Progress Notes (Signed)
EEG complete - results pending 

## 2023-11-23 NOTE — Procedures (Signed)
Patient Name: Brady Church  MRN: [id number]  Epilepsy Attending: Charlsie Quest  Referring Physician/Provider: Morene Crocker, MD  Date: 11/23/2023  Duration: 25.07 mins  Patient history: 62yo M with LOC getting eeg to evaluate for seizure  Level of alertness: Awake, asleep  AEDs during EEG study: None  Technical aspects: This EEG study was done with scalp electrodes positioned according to the 10-20 International system of electrode placement. Electrical activity was reviewed with band pass filter of 1-70Hz , sensitivity of 7 uV/mm, display speed of 74mm/sec with a 60Hz  notched filter applied as appropriate. EEG data were recorded continuously and digitally stored.  Video monitoring was available and reviewed as appropriate.  Description: The posterior dominant rhythm consists of 7.5 Hz activity of moderate voltage (25-35 uV) seen predominantly in posterior head regions, symmetric and reactive to eye opening and eye closing. Sleep was characterized by vertex waves, sleep spindles (12 to 14 Hz), maximal frontocentral region. Physiologic photic driving was not seen during photic stimulation.  Hyperventilation was not performed.     IMPRESSION: This study is within normal limits. No seizures or epileptiform discharges were seen throughout the recording.  A normal interictal EEG does not exclude the diagnosis of epilepsy.  Zahlia Deshazer Annabelle Harman

## 2023-11-24 ENCOUNTER — Inpatient Hospital Stay (HOSPITAL_COMMUNITY): Payer: MEDICAID

## 2023-11-24 DIAGNOSIS — L089 Local infection of the skin and subcutaneous tissue, unspecified: Secondary | ICD-10-CM | POA: Insufficient documentation

## 2023-11-24 LAB — BASIC METABOLIC PANEL
Anion gap: 6 (ref 5–15)
BUN: 13 mg/dL (ref 8–23)
CO2: 24 mmol/L (ref 22–32)
Calcium: 8.6 mg/dL — ABNORMAL LOW (ref 8.9–10.3)
Chloride: 104 mmol/L (ref 98–111)
Creatinine, Ser: 0.91 mg/dL (ref 0.61–1.24)
GFR, Estimated: >60 mL/min (ref >60)
Glucose, Bld: 164 mg/dL — ABNORMAL HIGH (ref 70–99)
Potassium: 3.9 mmol/L (ref 3.5–5.1)
Sodium: 134 mmol/L — ABNORMAL LOW (ref 135–145)

## 2023-11-24 LAB — CBC
HCT: 39.4 % (ref 39.0–52.0)
Hemoglobin: 13.6 g/dL (ref 13.0–17.0)
MCH: 32 pg (ref 26.0–34.0)
MCHC: 34.5 g/dL (ref 30.0–36.0)
MCV: 92.7 fL (ref 80.0–100.0)
Platelets: 278 10*3/uL (ref 150–400)
RBC: 4.25 MIL/uL (ref 4.22–5.81)
RDW: 14.6 % (ref 11.5–15.5)
WBC: 5.3 10*3/uL (ref 4.0–10.5)
nRBC: 0 % (ref 0.0–0.2)

## 2023-11-24 LAB — HEPATITIS B SURFACE ANTIBODY, QUANTITATIVE: Hep B S AB Quant (Post): <3.5 m[IU]/mL — ABNORMAL LOW

## 2023-11-24 LAB — GLUCOSE, CAPILLARY
Glucose-Capillary: 182 mg/dL — ABNORMAL HIGH (ref 70–99)
Glucose-Capillary: 134 mg/dL — ABNORMAL HIGH (ref 70–99)
Glucose-Capillary: 182 mg/dL — ABNORMAL HIGH (ref 70–99)
Glucose-Capillary: 256 mg/dL — ABNORMAL HIGH (ref 70–99)

## 2023-11-24 LAB — HCV INTERPRETATION

## 2023-11-24 LAB — HCV AB W REFLEX TO QUANT PCR: HCV Ab: NONREACTIVE

## 2023-11-24 MED ORDER — ACETAMINOPHEN 325 MG PO TABS: 650.0000 mg | ORAL_TABLET | Freq: Four times a day (QID) | ORAL | Status: DC | PRN

## 2023-11-24 MED ORDER — AMOXICILLIN-POT CLAVULANATE 875-125 MG PO TABS
1.0000 | ORAL_TABLET | Freq: Two times a day (BID) | ORAL | Status: DC
  Administered 2023-11-24 – 2023-11-26 (×5): 1 via ORAL
  Filled 2023-11-24 (×5): qty 1

## 2023-11-24 MED ORDER — SULFAMETHOXAZOLE-TRIMETHOPRIM 800-160 MG PO TABS
1.0000 | ORAL_TABLET | Freq: Two times a day (BID) | ORAL | Status: DC
  Administered 2023-11-24 – 2023-11-26 (×5): 1 via ORAL
  Filled 2023-11-24 (×5): qty 1

## 2023-11-24 MED ORDER — LOSARTAN POTASSIUM 50 MG PO TABS
25.0000 mg | ORAL_TABLET | Freq: Every day | ORAL | Status: DC
  Administered 2023-11-24 – 2023-11-26 (×3): 25 mg via ORAL
  Filled 2023-11-24 (×3): qty 1

## 2023-11-24 NOTE — Progress Notes (Cosign Needed Addendum)
Subjective Patient denies syncope/pre-syncope since admission. No chest pain/palpitations/SOB. Endorsing left foot pain after stepping on object a few days ago. Tearful during interview as patient has had many socioeconomic stressors including being kicked out of shelter (2/2 to disagreement with security) in days leading up to admission.  Physical exam Blood pressure (!) 153/98, pulse 75, temperature 99.2 F (37.3 C), temperature source Oral, resp. rate 18, height 5\' 8"  (1.727 m), weight 97.5 kg, SpO2 98%.  Physical Exam: Constitutional: lying in bed, in no acute distress Cardiovascular: regular rate and rhythm, no m/r/g, no LEE Pulmonary/Chest: normal work of breathing on room air, lungs clear to auscultation bilaterally Abdominal: soft, non-tender, non-distended MSK: left foot with ventral midline 1 mm puncture wound with surrounding swelling/mild ecchymosis. No erythema/warmness, mild tenderness to palpation Skin: warm and dry Psych: normal mood and behavior   Weight change:   No intake or output data in the 24 hours ending 11/24/23 1346 Net IO Since Admission: -400 mL [11/24/23 1346]  Labs, images, and other studies    Latest Ref Rng & Units 11/24/2023    8:46 AM 11/23/2023    6:48 AM 11/22/2023   11:23 PM  BMP  Glucose 70 - 99 mg/dL 956  387  564   BUN 8 - 23 mg/dL 13  13  13    Creatinine 0.61 - 1.24 mg/dL 3.32  9.51  8.84   Sodium 135 - 145 mmol/L 134  136  135   Potassium 3.5 - 5.1 mmol/L 3.9  4.3  3.1   Chloride 98 - 111 mmol/L 104  102  101   CO2 22 - 32 mmol/L 24  28  20    Calcium 8.9 - 10.3 mg/dL 8.6  8.7  8.5        Latest Ref Rng & Units 11/23/2023    6:48 AM 11/22/2023   11:23 PM 10/02/2023    5:25 AM  CBC  WBC 4.0 - 10.5 K/uL 5.5  5.6  5.7   Hemoglobin 13.0 - 17.0 g/dL 16.6  06.3  01.6   Hematocrit 39.0 - 52.0 % 41.1  44.1  40.3   Platelets 150 - 400 K/uL 294  252  252      Assessment and plan Hospital day 2  Brady Church is a  62 year old male with a history of combined systolic and diastolic heart failure, uncontrolled type 2 diabetes, prior cerebral infarction, intracranial stenosis, polysubstance use disorder who [facility] after losing consciousness   Principal Problem:   Syncope Active Problems:   Alcohol use disorder, severe, dependence (HCC)   Diabetes mellitus with diabetic cataract (HCC)   Chronic combined systolic and diastolic congestive heart failure (HCC)   Intracranial vascular stenosis   Prolonged Q-T interval on ECG   CVA (cerebral vascular accident) (HCC)   Visual impairment   Alcohol abuse   Cocaine abuse (HCC)   Diabetes mellitus type 2 with complications (HCC)   #Syncope Episode  #Hypotension #Prolonged QTc Etiology unclear but patient endorses reduced PO intake and was also unable to take medications leading up to admission. Since admission, he has remained hemodynamically stable. Last CIWA was 3. TTE this admission showed EF of 30-35% (35-40% in 09/2023) with global hypokinesis in LV. Grade I diastolic dysfunction. Orthostatics unremarkable. Due to prolonged QT on admission, avoiding QT prolonging medications. -Continue CIWA -Avoid QT prolonging medications -See plan below  for HF  #Left Foot Wound Patient stepped on unknown object a few days ago and has since experienced pain with/without ambulation. See physical exam. Foot X-ray obtained and unremarkable, however given physical exam findings combined with history of poorly controlled diabetes/being un-homed, will start Abx. Tdap booster last year. -DS Bactrim BID for 7 Days -Augmentin BID for 7 days -CBC    #Elevated LFTs #Alcohol use disorder AST marginally elevated at 47 on admission. Likely in the setting of alcohol use disorder. EtOH was 0.240 on admission. RUQ/Hepatitis panel unremarkable aside from low Hep B antibody consistently with incomplete immunity.    #Chronic combined systolic  and diastolic congestive heart failure  See above for TTE results. Restarting home Losartan given resolution of AKI.  -Repeat BMP -Consider restarting other outpatient GDMT  #AKI #Hypokalemia, resolved  #Poorly Controlled Type 2 Diabetes Mellitus  A1c in 09/2023 = 11.0 Continue SSI and trend CGM.  VTE: enoxaparin (LOVENOX) injection 40 mg Start: 11/23/23 1000  Code: Full   Discharge plan: Pending further evaluation   [name], DO 11/24/2023, 1:46 PM  Pager: [phone] After 5pm or weekend: [phone]

## 2023-11-24 NOTE — Progress Notes (Signed)
Mobility Specialist Progress Note:   11/24/23 1100  Mobility  Activity Ambulated with assistance in hallway  Level of Assistance Standby assist, set-up cues, supervision of patient - no hands on  Assistive Device Cane (*walking stick)  Distance Ambulated (ft) 300 ft  Activity Response Tolerated well  Mobility Referral Yes  Mobility visit 1 Mobility  Mobility Specialist Start Time (ACUTE ONLY) 1050  Mobility Specialist Stop Time (ACUTE ONLY) 1100  Mobility Specialist Time Calculation (min) (ACUTE ONLY) 10 min   Pt received in bed, agreeable to mobility session. Ambulated in hallway with SBA via walking stick. Tolerated well, asx throughout. Denies dizziness. Returned pt to room, left with all needs met.  Brady Church Mobility Specialist Please contact via Special educational needs teacher or  Rehab office at [phone]

## 2023-11-25 ENCOUNTER — Encounter: Payer: Self-pay | Admitting: Internal Medicine

## 2023-11-25 DIAGNOSIS — Z5948 Other specified lack of adequate food: Secondary | ICD-10-CM | POA: Insufficient documentation

## 2023-11-25 LAB — GLUCOSE, CAPILLARY
Glucose-Capillary: 152 mg/dL — ABNORMAL HIGH (ref 70–99)
Glucose-Capillary: 132 mg/dL — ABNORMAL HIGH (ref 70–99)
Glucose-Capillary: 153 mg/dL — ABNORMAL HIGH (ref 70–99)
Glucose-Capillary: 144 mg/dL — ABNORMAL HIGH (ref 70–99)

## 2023-11-25 LAB — BASIC METABOLIC PANEL
Anion gap: 6 (ref 5–15)
BUN: 13 mg/dL (ref 8–23)
CO2: 24 mmol/L (ref 22–32)
Calcium: 8.7 mg/dL — ABNORMAL LOW (ref 8.9–10.3)
Chloride: 106 mmol/L (ref 98–111)
Creatinine, Ser: 1.01 mg/dL (ref 0.61–1.24)
GFR, Estimated: >60 mL/min (ref >60)
Glucose, Bld: 196 mg/dL — ABNORMAL HIGH (ref 70–99)
Potassium: 3.9 mmol/L (ref 3.5–5.1)
Sodium: 136 mmol/L (ref 135–145)

## 2023-11-25 MED ORDER — METFORMIN HCL 500 MG PO TABS
1000.0000 mg | ORAL_TABLET | Freq: Two times a day (BID) | ORAL | Status: DC
  Administered 2023-11-25 – 2023-11-26 (×3): 1000 mg via ORAL
  Filled 2023-11-25 (×4): qty 2

## 2023-11-25 MED ORDER — EMPAGLIFLOZIN-METFORMIN HCL 5-1000 MG PO TABS: 1.0000 | ORAL_TABLET | Freq: Two times a day (BID) | ORAL | Status: DC

## 2023-11-25 MED ORDER — EMPAGLIFLOZIN 10 MG PO TABS
10.0000 mg | ORAL_TABLET | Freq: Every day | ORAL | Status: DC
  Administered 2023-11-25 – 2023-11-26 (×2): 10 mg via ORAL
  Filled 2023-11-25 (×3): qty 1

## 2023-11-25 NOTE — Plan of Care (Signed)

## 2023-11-25 NOTE — Progress Notes (Addendum)
Subjective Patient feels well overall. Foot pain has improved.  Physical exam Blood pressure (!) 141/90, pulse 66, temperature 98.1 F (36.7 C), resp. rate 18, height 5\' 8"  (1.727 m), weight 97.5 kg, SpO2 98%.  Physical Exam: Constitutional: lying in bed, in no acute distress Cardiovascular: regular rate and rhythm, no m/r/g, no LEE Pulmonary/Chest: normal work of breathing on room air, lungs clear to auscultation bilaterally Abdominal: soft, non-tender, non-distended MSK: left foot with ventral midline 1 mm puncture wound with improved swelling/mild ecchymosis. No erythema/warmness, no tenderness to palpation Skin: warm and dry Psych: normal mood and behavior  Weight change:   No intake or output data in the 24 hours ending 11/25/23 1341 Net IO Since Admission: -400 mL [11/25/23 1341]  Labs, images, and other studies    Latest Ref Rng & Units 11/24/2023    2:23 PM 11/23/2023    6:48 AM 11/22/2023   11:23 PM  CBC  WBC 4.0 - 10.5 K/uL 5.3  5.5  5.6   Hemoglobin 13.0 - 17.0 g/dL 20.2  54.2  70.6   Hematocrit 39.0 - 52.0 % 39.4  41.1  44.1   Platelets 150 - 400 K/uL 278  294  252        Latest Ref Rng & Units 11/25/2023    5:31 AM 11/24/2023    8:46 AM 11/23/2023    6:48 AM  BMP  Glucose 70 - 99 mg/dL 237  628  315   BUN 8 - 23 mg/dL 13  13  13    Creatinine 0.61 - 1.24 mg/dL 1.76  1.60  7.37   Sodium 135 - 145 mmol/L 136  134  136   Potassium 3.5 - 5.1 mmol/L 3.9  3.9  4.3   Chloride 98 - 111 mmol/L 106  104  102   CO2 22 - 32 mmol/L 24  24  28    Calcium 8.9 - 10.3 mg/dL 8.7  8.6  8.7      Assessment and plan Hospital day 2  Brady Church is a 62 year old male with a history of combined systolic and diastolic heart failure, uncontrolled type 2 diabetes, prior cerebral infarction, intracranial stenosis, polysubstance use disorder who [facility] after losing consciousness   Principal Problem:    Syncope Active Problems:   Alcohol use disorder, severe, dependence (HCC)   Diabetes mellitus with diabetic cataract (HCC)   Chronic combined systolic and diastolic congestive heart failure (HCC)   Intracranial vascular stenosis   Prolonged Q-T interval on ECG   CVA (cerebral vascular accident) (HCC)   Visual impairment   Alcohol abuse   Cocaine abuse (HCC)   Diabetes mellitus type 2 with complications (HCC)  #Syncope Episode  #Hypotension #Prolonged QTc Very hypotensive on arrival; patient endorsed poor PO intake and was also unable to take medications leading up to admission. Since admission following IVF he has remained hemodynamically stable. TTE this admission showed EF of 30-35% (35-40% in 09/2023) with global hypokinesis in LV. Grade I diastolic dysfunction. Orthostatics unremarkable after IVF.  Due to prolonged QT on admission, avoiding QT prolonging medications. -Avoid QT prolonging medications -See plan below for HF   #Left Foot Wound Patient stepped on unknown object a few days ago and has since experienced pain with/without ambulation. See physical exam. Foot X-ray obtained and unremarkable, however given physical exam findings combined with history of poorly controlled diabetes/being  un-homed, Abx started yesterday. Tdap booster last year. Today, foot pain has improved and swelling has reduced. -DS Bactrim BID for 7 Days (Day 2) -Augmentin BID for 7 days (Day 2)   #Chronic combined systolic and diastolic congestive heart failure  See above for TTE results. Restarted home Losartan yesterday given resolution of AKI. Given need for GDMT plus increasing CBG's, restarted home Synjardy today. -Continue Losartan and Synjardy -Consider restarting other outpatient GDMT  #Poorly Controlled Type 2 Diabetes Mellitus  A1c in 09/2023 = 11.0 CBG's increased to 200+ so home Synjardy restarted  #Disposition Patient is currently un-homed but feels comfortable with tentative discharge  plan tomorrow. He states he will be able to go to the [facility] for sheltering. Will provide medications before leaving hospital and will schedule follow-up with [facility].   #AKI #Hypokalemia, resolved   VTE: enoxaparin (LOVENOX) injection 40 mg Start: 11/23/23 1000  Code: Full    Brady Miller, DO 11/25/2023, 1:41 PM  Pager: [phone] After 5pm or weekend: [phone]

## 2023-11-25 NOTE — Progress Notes (Signed)
Mobility Specialist Progress Note:   11/25/23 1027  Mobility  Activity Ambulated with assistance in hallway  Level of Assistance Standby assist, set-up cues, supervision of patient - no hands on  Assistive Device Cane (*walking stick)  Distance Ambulated (ft) 300 ft  Activity Response Tolerated well  Mobility Referral Yes  Mobility visit 1 Mobility  Mobility Specialist Start Time (ACUTE ONLY) 1020  Mobility Specialist Stop Time (ACUTE ONLY) 1027  Mobility Specialist Time Calculation (min) (ACUTE ONLY) 7 min   Pt received in bed, agreeable to mobility session. Ambulated in hallway with walking stick, SBA for safety. Required verbal cues and directions d/t vision loss. Tolerated session well, asx throughout. Returned pt to room, sitting up with all needs met, RN in room.  Feliciana Rossetti Mobility Specialist Please contact via Special educational needs teacher or  Rehab office at [phone]

## 2023-11-26 ENCOUNTER — Other Ambulatory Visit (HOSPITAL_COMMUNITY): Payer: Self-pay

## 2023-11-26 ENCOUNTER — Telehealth (HOSPITAL_COMMUNITY): Payer: Self-pay | Admitting: Pharmacy Technician

## 2023-11-26 LAB — BASIC METABOLIC PANEL
Anion gap: 9 (ref 5–15)
BUN: 13 mg/dL (ref 8–23)
CO2: 24 mmol/L (ref 22–32)
Calcium: 9.5 mg/dL (ref 8.9–10.3)
Chloride: 104 mmol/L (ref 98–111)
Creatinine, Ser: 1.16 mg/dL (ref 0.61–1.24)
GFR, Estimated: >60 mL/min (ref >60)
Glucose, Bld: 132 mg/dL — ABNORMAL HIGH (ref 70–99)
Potassium: 4 mmol/L (ref 3.5–5.1)
Sodium: 137 mmol/L (ref 135–145)

## 2023-11-26 LAB — GLUCOSE, CAPILLARY
Glucose-Capillary: 127 mg/dL — ABNORMAL HIGH (ref 70–99)
Glucose-Capillary: 117 mg/dL — ABNORMAL HIGH (ref 70–99)

## 2023-11-26 MED ORDER — METFORMIN HCL 1000 MG PO TABS
1000.0000 mg | ORAL_TABLET | Freq: Two times a day (BID) | ORAL | 0 refills | Status: DC
  Filled 2023-11-26 – 2023-11-27 (×2): qty 60, 30d supply, fill #0

## 2023-11-26 MED ORDER — LOSARTAN POTASSIUM 25 MG PO TABS
25.0000 mg | ORAL_TABLET | Freq: Every day | ORAL | 0 refills | Status: DC
  Filled 2023-11-26: qty 30, 30d supply, fill #0

## 2023-11-26 MED ORDER — EMPAGLIFLOZIN 10 MG PO TABS
10.0000 mg | ORAL_TABLET | Freq: Every day | ORAL | 0 refills | Status: AC
  Filled 2023-11-26 – 2023-11-27 (×2): qty 30, 30d supply, fill #0

## 2023-11-26 MED ORDER — SYNJARDY 5-1000 MG PO TABS
1.0000 | ORAL_TABLET | Freq: Two times a day (BID) | ORAL | 0 refills | Status: DC
  Filled 2023-11-26: qty 60, 30d supply, fill #0

## 2023-11-26 MED ORDER — AMOXICILLIN-POT CLAVULANATE 875-125 MG PO TABS
1.0000 | ORAL_TABLET | Freq: Two times a day (BID) | ORAL | 0 refills | Status: AC
  Filled 2023-11-26 – 2023-11-27 (×2): qty 9, 5d supply, fill #0

## 2023-11-26 MED ORDER — ATORVASTATIN CALCIUM 80 MG PO TABS
80.0000 mg | ORAL_TABLET | Freq: Every day | ORAL | 0 refills | Status: DC
  Filled 2023-11-26: qty 30, 30d supply, fill #0

## 2023-11-26 MED ORDER — SULFAMETHOXAZOLE-TRIMETHOPRIM 800-160 MG PO TABS
1.0000 | ORAL_TABLET | Freq: Two times a day (BID) | ORAL | 0 refills | Status: AC
  Filled 2023-11-26 – 2023-11-27 (×2): qty 9, 5d supply, fill #0

## 2023-11-26 NOTE — Progress Notes (Signed)
Occupational Therapy Treatment Patient Details Name: Brady Church MRN: [id number] DOB: [birth year] Today's Date: 11/26/2023   History of present illness The pt is a 62 yo male presenting 12/26 after falling out of a chair, hypotensive upon arrival of EMS. PMH includes: polysubstance use (alcohol use disorder, cocaine use disorder), DM II, HTN, HLD, glaucoma, left MCA territory infarct, and legal blindness.   OT comments  Pt. Seen for skilled OT treatment session.  Able to complete/demo safe LB dressing tech., ambulation to/from b.room with S with use of white cane/ mobility stick for visual impairment.  Moving well, reports completing grooming tasks prior to my arrival without assistance.  Cont. With acute OT POC.        If plan is discharge home, recommend the following:  Assist for transportation   Equipment Recommendations  None recommended by OT    Recommendations for Other Services      Precautions / Restrictions Precautions Precautions: Fall Precaution Comments: blind Restrictions Weight Bearing Restrictions Per Provider Order: No       Mobility Bed Mobility               General bed mobility comments: sitting EoB    Transfers Overall transfer level: Needs assistance Equipment used: None (white cane/mobility stick for visually impaired) Transfers: Sit to/from Stand Sit to Stand: Independent     Step pivot transfers: Supervision     General transfer comment: independent for power up from bed, COTA provided pt with walking stick once up in standing     Balance                                           ADL either performed or assessed with clinical judgement   ADL Overall ADL's : Needs assistance/impaired       Grooming Details (indicate cue type and reason): pt. reports he completed prior to my arrival without assistance             Lower Body Dressing: Sitting/lateral leans;Supervision/safety Lower Body Dressing Details  (indicate cue type and reason): reviewed figure four vs. bending forward to reach BLEs pt. able to return demo and states he thought is was a good idea Toilet Transfer: Modified Community education officer Details (indicate cue type and reason): utilizes white cane/mobility stick for ambulation.  did not have to use the b.room but completed ambulation in/out of the b.room           General ADL Comments: Generalized supervision due to new environment, but no physical assistance required    Extremity/Trunk Assessment              Vision       Perception     Praxis      Cognition Arousal: Alert Behavior During Therapy: WFL for tasks assessed/performed Overall Cognitive Status: Within Functional Limits for tasks assessed                                          Exercises      Shoulder Instructions       General Comments VSS on RA    Pertinent Vitals/ Pain       Pain Assessment Pain Assessment: No/denies pain  Home Living  Prior Functioning/Environment              Frequency  Min 1X/week        Progress Toward Goals  OT Goals(current goals can now be found in the care plan section)  Progress towards OT goals: Progressing toward goals     Plan      Co-evaluation                 AM-PAC OT "6 Clicks" Daily Activity     Outcome Measure   Help from another person eating meals?: None Help from another person taking care of personal grooming?: None Help from another person toileting, which includes using toliet, bedpan, or urinal?: A Little Help from another person bathing (including washing, rinsing, drying)?: A Little Help from another person to put on and taking off regular upper body clothing?: None Help from another person to put on and taking off regular lower body clothing?: A Little 6 Click Score: 21    End of Session Equipment Utilized During Treatment:  Gait belt;Other (comment) (White cane/ mobility stick)  OT Visit Diagnosis: Unsteadiness on feet (R26.81)   Activity Tolerance Patient tolerated treatment well   Patient Left Other (comment) (seated eob with call bell in reach)   Nurse Communication          Time: 6213-0865 OT Time Calculation (min): 8 min  Charges: OT General Charges $OT Visit: 1 Visit OT Treatments $Self Care/Home Management : 8-22 mins  Boneta Lucks, COTA/L Acute Rehabilitation [phone]   Alessandra Bevels Lorraine-COTA/L 11/26/2023, 1:03 PM

## 2023-11-26 NOTE — TOC Progression Note (Signed)
Transition of Care [facility]) - Progression Note    Patient Details  Name: Brady Church MRN: [id number] Date of Birth: 1961/05/26  Transition of [facility]) CM/SW Contact  Madailein Londo A [place], [place] Phone Number: 11/26/2023, 10:57 AM  Clinical Narrative:      CSW met with pt at bedside. CSW provided transportation resources. CSW provided taxi voucher, estimated DC today to [facility] in [place].   No other needs identified at this time. TOC will sign off, please consult again if TOC needs arise.        Expected Discharge Plan and Services                                               Social Determinants of Health (SDOH) Interventions SDOH Screenings   Food Insecurity: Food Insecurity Present (11/23/2023)  Housing: High Risk (11/23/2023)  Transportation Needs: Unmet Transportation Needs (11/23/2023)  Utilities: At Risk (11/23/2023)  Alcohol Screen: Low Risk  (10/02/2023)  Depression (PHQ2-9): Low Risk  (11/07/2023)  Financial Resource Strain: High Risk (10/02/2023)  Social Connections: Socially Isolated (10/11/2022)  Tobacco Use: Medium Risk (11/22/2023)    Readmission Risk Interventions     No data to display

## 2023-11-26 NOTE — Progress Notes (Signed)
11/26/2023  Brady Church DOB: 1961-09-12 MRN: [id number]   RIDER WAIVER AND RELEASE OF LIABILITY  For the purposes of helping with transportation needs, [facility] partners with outside transportation providers (taxi companies, [place], Catering manager.) to give [employer] patients or other approved people the choice of on-demand rides [employer]") to our buildings for non-emergency visits.  By using [employer], I, the person signing this document, on behalf of myself and/or any legal minors (in my care using the [employer]), agree:  Science writer given to me are supplied by independent, outside transportation providers who do not work for, or have any affiliation with, [employer]. [place] is not a transportation company. [facility] has no control over the quality or safety of the rides I get using [employer]. [facility] has no control over whether any outside ride will happen on time or not. [facility] gives no guarantee on the reliability, quality, safety, or availability on any rides, or that no mistakes will happen. I know and accept that traveling by vehicle (car, truck, SVU, Zenaida Niece, bus, taxi, etc.) has risks of serious injuries such as disability, being paralyzed, and death. I know and agree the risk of using [employer] is mine alone, and not [employer]. Transport Services are provided "as is" and as are available. The transportation providers are in charge for all inspections and care of the vehicles used to provide these rides. I agree not to take legal action against [facility], its agents, employees, officers, directors, representatives, insurers, attorneys, assigns, successors, subsidiaries, and affiliates at any time for any reasons related directly or indirectly to using [employer]. I also agree not to take legal action against [facility] or its affiliates for any injury, death, or damage to property caused by or related to using  [employer]. I have read this Waiver and Release of Liability, and I understand the terms used in it and their legal meaning. This Waiver is freely and voluntarily given with the understanding that my right (or any legal minors) to legal action against  relating to [employer] is knowingly given up to use these services.   I attest that I read the Ride Waiver and Release of Liability to Brady Church, gave Mr. Kanitz the opportunity to ask questions and answered the questions asked (if any). I affirm that Brady Church then provided consent for assistance with transportation.

## 2023-11-26 NOTE — Telephone Encounter (Signed)
Pharmacy Patient Advocate Encounter   Received notification that prior authorization for Jardiance 10MG  tablets is required/requested.   Insurance verification completed.   The patient is insured through [place] .   Per test claim: PA required; PA submitted to above mentioned insurance via CoverMyMeds Key/confirmation #/EOC [id number] Status is pending

## 2023-11-26 NOTE — Progress Notes (Signed)
Mobility Specialist Progress Note:   11/26/23 0940  Mobility  Activity Ambulated with assistance in hallway  Level of Assistance Standby assist, set-up cues, supervision of patient - no hands on  Assistive Device None;Other (Comment) (walking stick)  Distance Ambulated (ft) 400 ft  Activity Response Tolerated well  Mobility Referral Yes  Mobility visit 1 Mobility  Mobility Specialist Start Time (ACUTE ONLY) 0940  Mobility Specialist Stop Time (ACUTE ONLY) 0953  Mobility Specialist Time Calculation (min) (ACUTE ONLY) 13 min   Pt eager for mobility session. Required no physical assistance, only supervision for safety d/t visual deficits. No c/o throughout, pt back in bed with all needs met.   Addison Lank Mobility Specialist Please contact via SecureChat or  Rehab office at [phone]

## 2023-11-26 NOTE — Plan of Care (Signed)
  Problem: Education: Goal: Ability to describe self-care measures that may prevent or decrease complications (Diabetes Survival Skills Education) will improve Outcome: Progressing   Problem: Education: Goal: Individualized Educational Video(s) Outcome: Progressing   Problem: Coping: Goal: Ability to adjust to condition or change in health will improve Outcome: Progressing

## 2023-11-26 NOTE — Progress Notes (Signed)
Physical Therapy Treatment and Discharge Patient Details Name: Brady Church MRN: [id number] DOB: November 02, 1961 Today's Date: 11/26/2023   History of Present Illness The pt is a 62 yo male presenting 12/26 after falling out of a chair, hypotensive upon arrival of EMS. PMH includes: polysubstance use (alcohol use disorder, cocaine use disorder), DM II, HTN, HLD, glaucoma, left MCA territory infarct, and legal blindness.    PT Comments  Pt agreeable to working with therapy. With the exception of providing pt with his walking stick and giving minimal directions pt is able to mobilize at a mod I level, ambulating >450 feet. Pt agrees that he is at his baseline level of function.  Pt has met his goals and PT is signing off.    If plan is discharge home, recommend the following: A little help with walking and/or transfers;Assistance with cooking/housework;Help with stairs or ramp for entrance   Can travel by private vehicle      Yes  Equipment Recommendations  None recommended by PT       Precautions / Restrictions Precautions Precautions: Fall Precaution Comments: blind Restrictions Weight Bearing Restrictions Per Provider Order: No     Mobility  Bed Mobility               General bed mobility comments: sitting EoB    Transfers Overall transfer level: Needs assistance Equipment used: None (visually impaired walking stick) Transfers: Sit to/from Stand Sit to Stand: Independent           General transfer comment: independent for power up from bed, PT provided pt with walking stick once up in standing    Ambulation/Gait Ambulation/Gait assistance: Supervision Gait Distance (Feet): 450 Feet Assistive device:  (visually impaired walking stick) Gait Pattern/deviations: Step-through pattern Gait velocity: WFL, pt reports it is his baseline speed     General Gait Details: Steady gait with modI only due to blindness and unfamiliar environment, able to walk through  hallway with numerous obstacles without problem, pt reports he is back at his baseline       Balance Overall balance assessment: Mild deficits observed, not formally tested Sitting-balance support: Feet supported Sitting balance-Leahy Scale: Good     Standing balance support: Single extremity supported, Bilateral upper extremity supported, No upper extremity supported Standing balance-Leahy Scale: Good                              Cognition Arousal: Alert Behavior During Therapy: WFL for tasks assessed/performed Overall Cognitive Status: Within Functional Limits for tasks assessed                                             General Comments General comments (skin integrity, edema, etc.): VSS on RA      Pertinent Vitals/Pain Pain Assessment Pain Assessment: No/denies pain     PT Goals (current goals can now be found in the care plan section) Acute Rehab PT Goals Patient Stated Goal: to return to independence PT Goal Formulation: With patient Time For Goal Achievement: 12/07/23 Potential to Achieve Goals: Good Progress towards PT goals: Progressing toward goals    Frequency    Min 1X/week       AM-PAC PT "6 Clicks" Mobility   Outcome Measure  Help needed turning from your back to your side while in a flat bed without using  bedrails?: None Help needed moving from lying on your back to sitting on the side of a flat bed without using bedrails?: None Help needed moving to and from a bed to a chair (including a wheelchair)?: None Help needed standing up from a chair using your arms (e.g., wheelchair or bedside chair)?: None Help needed to walk in hospital room?: None Help needed climbing 3-5 steps with a railing? : A Little 6 Click Score: 23    End of Session Equipment Utilized During Treatment: Gait belt Activity Tolerance: Patient tolerated treatment well Patient left: in bed;with call bell/phone within reach;with bed alarm  set Nurse Communication: Mobility status PT Visit Diagnosis: Other abnormalities of gait and mobility (R26.89)     Time: 1140-1150 PT Time Calculation (min) (ACUTE ONLY): 10 min  Charges:    $Therapeutic Exercise: 8-22 mins PT General Charges $$ ACUTE PT VISIT: 1 Visit                     Deretha Ertle B. Beverely Risen PT, DPT Acute Rehabilitation Services Please use secure chat or  Call Office [phone]    Elon Alas [facility] 11/26/2023, 11:57 AM

## 2023-11-27 ENCOUNTER — Other Ambulatory Visit (HOSPITAL_COMMUNITY): Payer: Self-pay

## 2023-11-27 NOTE — Telephone Encounter (Signed)
 Pharmacy Patient Advocate Encounter  Received notification from [facility] that Prior Authorization for Jardiance 10MG  tablets  has been APPROVED from 11/26/2023 to 11/25/2024   PA #/Case ID/Reference #: [id number]

## 2023-11-29 ENCOUNTER — Other Ambulatory Visit (HOSPITAL_COMMUNITY): Payer: Self-pay

## 2023-11-30 ENCOUNTER — Other Ambulatory Visit (HOSPITAL_COMMUNITY): Payer: Self-pay

## 2023-11-30 ENCOUNTER — Ambulatory Visit (INDEPENDENT_AMBULATORY_CARE_PROVIDER_SITE_OTHER): Payer: MEDICAID | Admitting: Student

## 2023-11-30 VITALS — BP 135/88 | HR 67 | Temp 98.2°F | Ht 68.0 in | Wt 206.8 lb

## 2023-11-30 DIAGNOSIS — E1165 Type 2 diabetes mellitus with hyperglycemia: Secondary | ICD-10-CM

## 2023-11-30 DIAGNOSIS — Z23 Encounter for immunization: Secondary | ICD-10-CM | POA: Diagnosis not present

## 2023-11-30 DIAGNOSIS — I5042 Chronic combined systolic (congestive) and diastolic (congestive) heart failure: Secondary | ICD-10-CM | POA: Diagnosis not present

## 2023-11-30 DIAGNOSIS — Z7984 Long term (current) use of oral hypoglycemic drugs: Secondary | ICD-10-CM

## 2023-11-30 DIAGNOSIS — Z59819 Housing instability, housed unspecified: Secondary | ICD-10-CM

## 2023-11-30 DIAGNOSIS — S91332D Puncture wound without foreign body, left foot, subsequent encounter: Secondary | ICD-10-CM | POA: Insufficient documentation

## 2023-11-30 DIAGNOSIS — F1010 Alcohol abuse, uncomplicated: Secondary | ICD-10-CM

## 2023-11-30 MED ORDER — LOSARTAN POTASSIUM 25 MG PO TABS
25.0000 mg | ORAL_TABLET | Freq: Every day | ORAL | 3 refills | Status: DC
  Filled 2023-11-30 – 2023-12-25 (×2): qty 30, 30d supply, fill #0
  Filled 2024-02-21: qty 30, 30d supply, fill #1

## 2023-11-30 MED ORDER — TIMOLOL MALEATE 0.5 % OP SOLN
1.0000 [drp] | Freq: Two times a day (BID) | OPHTHALMIC | 2 refills | Status: AC
  Filled 2023-11-30: qty 5, 50d supply, fill #0

## 2023-11-30 MED ORDER — ADULT MULTIVITAMIN W/MINERALS CH: 1.0000 | ORAL_TABLET | Freq: Every day | ORAL | 0 refills | Status: DC

## 2023-11-30 MED ORDER — ATORVASTATIN CALCIUM 80 MG PO TABS
80.0000 mg | ORAL_TABLET | Freq: Every day | ORAL | 3 refills | Status: DC
  Filled 2023-11-30 – 2023-12-25 (×2): qty 30, 30d supply, fill #0

## 2023-11-30 MED ORDER — FOLIC ACID 1 MG PO TABS
1.0000 mg | ORAL_TABLET | Freq: Every day | ORAL | 3 refills | Status: AC
  Filled 2023-11-30: qty 30, 30d supply, fill #0

## 2023-11-30 NOTE — Assessment & Plan Note (Signed)
>>  ASSESSMENT AND PLAN FOR ALCOHOL ABUSE WRITTEN ON 12/01/2023 10:27 AM BY INGOLD, TYLER, DO  BAC was 0.240 on 10/2023 admission. During this admission, patient counseled on the importance alcohol cessation. Since discharge, patient has only had one beer and states that he didn't like the way it made him feel. Patient no longer wants to drink and plans on quitting completely.

## 2023-11-30 NOTE — Assessment & Plan Note (Addendum)
 Patient admitted 12/26-12/30 for witnessed syncopal episode. TTE showed EF of 30-35% but syncopal episode was most likely attributed to poor PO intake and alcohol use (see below) in the setting of his chronic health conditions. Since discharge, no syncope/presyncope. So signs of volume overload on exam. BP mildly elevated at 146/83. Has not restarted any GDMT so will start with Losartan  and re-assess others at follow-up in one month. -Refilled Losartan  25 mg/daily

## 2023-11-30 NOTE — Patient Instructions (Signed)
 Please finish both your antibioitics.  I have sent your prescriptions to the [facility].  Follow-up in one month.

## 2023-11-30 NOTE — Assessment & Plan Note (Addendum)
 Patient denies foot pain associated with wound. Physical exam shows no signs of swelling/infection. Patient has Bactrim/Augmentin and will finish 7 day course.

## 2023-11-30 NOTE — Assessment & Plan Note (Addendum)
 A1c 09/2023 = 11.0. Patient instructed on the importance of healthy diet. -Continue Synjardy  (patient discharged with Metformin  and Jardiance  from [facility] but will continue on Synjardy  after these are finished). -Re-check at f/u and consider adding additional agents if needed.

## 2023-11-30 NOTE — Assessment & Plan Note (Addendum)
 BAC was 0.240 on 10/2023 admission. During this admission, patient counseled on the importance alcohol cessation. Since discharge, patient has only had one beer and states that he didn't like the way it made him feel. Patient no longer wants to drink and plans on quitting completely.

## 2023-11-30 NOTE — Assessment & Plan Note (Addendum)
 Patient unhomed but is staying at the [facility] at night. Patient provided food items during this visit and states that he has friends/family to help him with medications since he is legally blind.

## 2023-11-30 NOTE — Progress Notes (Signed)
 CC: Hospital Follow-up  HPI:  Mr.Brady Church is a 63 y.o. male living with a history stated below and presents today for follow-up. Please see problem based assessment and plan for additional details.  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 Stopped in 2010   Tobacco use disorder 10/07/2014    Current Outpatient Medications on File Prior to Visit  Medication Sig Dispense Refill   amoxicillin -clavulanate (AUGMENTIN ) 875-125 MG tablet Take 1 tablet by mouth 2 (two) times daily for 4 days. Start tonight 11/26/2023 9 tablet 0   aspirin  EC 81 MG tablet Take 1 tablet (81 mg total) by mouth daily. Swallow whole. 90 tablet 3   brimonidine  (ALPHAGAN ) 0.15 % ophthalmic solution Place 1 drop into both eyes 2 (two) times daily. (Patient not taking: Reported on 11/23/2023) 5 mL 6   [Paused] carvedilol  (COREG ) 3.125 MG tablet Take 1 tablet (3.125 mg total) by mouth 2 (two) times daily with a meal. 180 tablet 3   empagliflozin  (JARDIANCE ) 10 MG TABS tablet Take 1 tablet (10 mg total) by mouth daily with breakfast. 30 tablet 0   fluorometholone  (FML) 0.1 % ophthalmic suspension Place 1 drop into both eyes daily. (Patient not taking: Reported on 11/23/2023) 5 mL 2   [Paused] furosemide  (LASIX ) 40 MG tablet Take 1 tablet (40 mg total) by mouth 2 (two) times daily. 60 tablet 3   metFORMIN  (GLUCOPHAGE ) 1000 MG tablet Take 1 tablet (1,000 mg total) by mouth 2 (two) times daily with a meal. 60 tablet 0   RHOPRESSA  0.02 % SOLN Apply 1 drop to eye at bedtime. Use in the evening 2.5 mL 3   ROCKLATAN  0.02-0.005 % SOLN Place 1 drop into the right eye at bedtime. (Patient not  taking: Reported on 11/23/2023) 2.5 mL 0   SIMBRINZA  1-0.2 % SUSP Place 1 drop into the right eye 2 (two) times daily. (Patient not taking: Reported on 11/23/2023) 8 mL 3   [Paused] spironolactone  (ALDACTONE ) 25 MG tablet Take 0.5 tablets (12.5 mg total) by mouth daily. 45 tablet 3   sulfamethoxazole -trimethoprim  (BACTRIM  DS) 800-160 MG tablet Take 1 tablet by mouth 2 (two) times daily for 4 days. Start tonight 11/26/2023 9 tablet 0   No current facility-administered medications on file prior to visit.    Family History  Problem Relation Age of Onset   Coronary artery disease Mother 4       died of MI   Cancer Father 52       Some GI cancer. Not sure if it was Colon Cancer or not.   Hypertension Father    Diabetes Maternal Grandmother     Social History   Socioeconomic History   Marital status: Single    Spouse name: Not on file   Number of children: Not on file   Years of education: 12   Highest education level: Not on file  Occupational History   Occupation: Not working  Tobacco Use   Smoking status: Former    Current packs/day: 0.00    Types: Cigarettes    Quit date: 02/18/2017    Years since quitting:  6.7   Smokeless tobacco: Never  Vaping Use   Vaping status: Never Used  Substance and Sexual Activity   Alcohol use: Yes    Comment: Beer- 40 oz. every day.   Drug use: Not Currently    Types: Cocaine, Marijuana   Sexual activity: Not on file  Other Topics Concern   Not on file  Social History Narrative   Lives in [place] for last 10 years with friend  Camillo Sar      Is not married and does not have kids.   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   Social Drivers of Corporate Investment Banker Strain: High Risk (10/02/2023)   Overall Financial Resource Strain (CARDIA)    Difficulty of Paying Living Expenses: Very hard  Food Insecurity: Food Insecurity Present (11/23/2023)   Hunger  Vital Sign    Worried About Running Out of Food in the Last Year: Often true    Ran Out of Food in the Last Year: Often true  Transportation Needs: Unmet Transportation Needs (11/23/2023)   PRAPARE - Administrator, Civil Service (Medical): Yes    Lack of Transportation (Non-Medical): Yes  Physical Activity: Not on file  Stress: Not on file  Social Connections: Socially Isolated (10/11/2022)   Social Connection and Isolation Panel [NHANES]    Frequency of Communication with Friends and Family: Never    Frequency of Social Gatherings with Friends and Family: Never    Attends Religious Services: Never    Database Administrator or Organizations: No    Attends Banker Meetings: Never    Marital Status: Never married  Intimate Partner Violence: Not At Risk (11/23/2023)   Humiliation, Afraid, Rape, and Kick questionnaire    Fear of Current or Ex-Partner: No    Emotionally Abused: No    Physically Abused: No    Sexually Abused: No    Review of Systems: ROS negative except for what is noted on the assessment and plan.  Vitals:   11/30/23 1054 11/30/23 1125  BP: (!) 146/83 135/88  Pulse: 74 67  Temp: 98.2 F (36.8 C)   TempSrc: Oral   SpO2: 100%   Weight: 206 lb 12.8 oz (93.8 kg)   Height: 5' 8 (1.727 m)     Physical Exam: Constitutional: well-appearing, sitting in chair, in no acute distress Cardiovascular: regular rate and rhythm, no m/r/g, no LEE Pulmonary/Chest: normal work of breathing on room air, lungs clear to auscultation bilaterally Abdominal: soft, non-tender, non-distended Skin: warm and dry, left foot with healing ventral puncture wound, no swelling/erythema/drainage Psych: normal mood and behavior  Assessment & Plan:     Patient discussed with Dr. Forest  Chronic combined systolic and diastolic congestive heart failure (HCC) Patient admitted 12/26-12/30 for witnessed syncopal episode. TTE showed EF of 30-35% but syncopal episode  was most likely attributed to poor PO intake and alcohol use (see below) in the setting of his chronic health conditions. Since discharge, no syncope/presyncope. So signs of volume overload on exam. BP mildly elevated at 146/83. Has not restarted any GDMT so will start with Losartan  and re-assess others at follow-up in one month. -Refilled Losartan  25 mg/daily  Penetrating foot wound, left, subsequent encounter Patient denies foot pain associated with wound. Physical exam shows no signs of swelling/infection. Patient has Bactrim /Augmentin  and will finish 7 day course.  Poorly controlled type 2 diabetes mellitus (HCC) A1c  09/2023 = 11.0. Patient instructed on the importance of healthy diet. -Continue Synjardy  (patient discharged with Metformin  and Jardiance  from [facility] but will continue on Synjardy  after these are finished). -Re-check at f/u and consider adding additional agents if needed.  Alcohol abuse BAC was 0.240 on 10/2023 admission. During this admission, patient counseled on the importance alcohol cessation. Since discharge, patient has only had one beer and states that he didn't like the way it made him feel. Patient no longer wants to drink and plans on quitting completely.   Housing instability Patient unhomed but is staying at the [employer] at night. Patient provided food items during this visit and states that he has friends/family to help him with medications since he is legally blind.   Norman Lobstein, D.O. [facility] Health Internal Medicine, PGY-1 Phone: [phone] Date 12/01/2023 Time 10:28 AM

## 2023-12-03 NOTE — Progress Notes (Signed)
 Internal Medicine Clinic Attending  Case discussed with the resident at the time of the visit.  We reviewed the resident's history and exam and pertinent patient test results.  I agree with the assessment, diagnosis, and plan of care documented in the resident's note.

## 2023-12-10 ENCOUNTER — Ambulatory Visit (INDEPENDENT_AMBULATORY_CARE_PROVIDER_SITE_OTHER): Payer: MEDICAID | Admitting: Licensed Clinical Social Worker

## 2023-12-10 DIAGNOSIS — Z5181 Encounter for therapeutic drug level monitoring: Secondary | ICD-10-CM

## 2023-12-10 DIAGNOSIS — Z59819 Housing instability, housed unspecified: Secondary | ICD-10-CM

## 2023-12-10 NOTE — BH Specialist Note (Signed)
 Integrated Behavioral Health Initial In-Person Visit  MRN: [id number] Name: Aydin Hink  Number of Integrated Behavioral Health Clinician visits: No data recorded Session Start time: 1330    Session End time: 1400  Total time in minutes: 30   Types of Service: Introduction only  Interpretor:No. Interpretor Name and Language: [name]   Warm Hand Off Completed.        Subjective: Rodarius Kichline is a 63 y.o. male  Patient was referred by PCP for Behavioral Health. Patient reports the following symptoms/concerns: Patient in office to see [facility]. [facility] introduced self and discussed confidentiality. [facility] reviewed chart and seen [facility] has been trying to outreach patient to schedule for medication management and therapy. [facility] assisted patient in scheduling with [facility]. [facility] called and spoke with staff. Patient scheduled for the following;   February 6 at 11 am for medication management  April 1st at 1 pm -therapy   Plan: Follow up with behavioral health clinician on : on February 6  Renda Pontes, MSW, LCSW-A She/Her Behavioral Health Clinician [facility]  Internal Medicine Center Direct [phone]  Fax [phone] Main Office Phone: [phone] [address] [place] [place], [place] [address] Website: [facility] Internal Medicine [facility]  [place], [place]  [place]

## 2023-12-11 ENCOUNTER — Ambulatory Visit (INDEPENDENT_AMBULATORY_CARE_PROVIDER_SITE_OTHER): Payer: MEDICAID | Admitting: Dietician

## 2023-12-11 DIAGNOSIS — E1165 Type 2 diabetes mellitus with hyperglycemia: Secondary | ICD-10-CM | POA: Diagnosis not present

## 2023-12-11 LAB — GLUCOSE, CAPILLARY: Glucose-Capillary: 201 mg/dL — ABNORMAL HIGH (ref 70–99)

## 2023-12-11 NOTE — Patient Instructions (Signed)
 Please keep taking your medications.  Please drink only water, unsweetened tea, coffee.    Try to eat 3 balanced healthy meals a day.   Your blood sugar is getting better.    Please air your feet out often to keep them dry. Take the inserts out of your shoes to help them dry thoroughly. Change socks every day. Be sure to keep them dry also.  [name] [phone]

## 2023-12-11 NOTE — Progress Notes (Signed)
 Diabetes Self-Management Education  Visit Type: First/Initial  Appt. Start Time: 9:35am  Appt. End Time: 10:20am  12/11/2023  Mr. Brady Church, identified by name and date of birth, is a 63 y.o. male with a diagnosis of Diabetes: Type 2.   ASSESSMENT  Deferred weighing  CBG (last 3)  Recent Labs    12/11/23 0951  GLUCAP 201*      Diabetes Self-Management Education - 12/11/23 1500       Visit Information   Visit Type First/Initial      Initial Visit   Diabetes Type Type 2    Are you currently following a meal plan? Church    Are you taking your medications as prescribed? Yes   He's beginning to     Psychosocial Assessment   Patient Belief/Attitude about Diabetes Other (comment)   He's aware ,but struggling with competing values   What is the hardest part about your diabetes right now, causing you the most concern, or is the most worrisome to you about your diabetes?   --   Planned to assess at future visit   Self-care barriers Impaired vision;Lack of transportation;Other (comment);Lack of material resources   Substance abuse   Self-management support Doctor's office;CDE visits    Other persons present Other (comment)   RDN Intern   Patient Concerns Other (comment)   His Feet   Special Needs Verbal instruction    Preferred Learning Style Auditory    Learning Readiness Not Ready    How often do you need to have someone help you when you read instructions, pamphlets, or other written materials from your doctor or pharmacy? 4 - Often    What is the last grade level you completed in school? 12      Pre-Education Assessment   Patient understands the diabetes disease and treatment process. Comprehends key points    Patient understands incorporating nutritional management into lifestyle. Needs Review    Patient undertands incorporating physical activity into lifestyle. [name] (comment)   Not of interest to patient today   Patient understands using medications safely. Needs Review     Patient understands monitoring blood glucose, interpreting and using results Comprehends key points    Patient understands prevention, detection, and treatment of acute complications. Comprehends key points    Patient understands prevention, detection, and treatment of chronic complications. Compreheands key points    Patient understands how to develop strategies to address psychosocial issues. Demonstrates understanding / competency    Patient understands how to develop strategies to promote health/change behavior. [name] (comment)   Planned to assess at future visit     Complications   Last HgB A1C per patient/outside source 11 %    How often do you check your blood sugar? 0 times/day (not testing)    Fasting Blood glucose range (mg/dL) --   Don't know   Postprandial Blood glucose range (mg/dL) >799    Number of hypoglycemic episodes per month 0    Number of hyperglycemic episodes ( >200mg /dL): --   Unknown   Have you had a dilated eye exam in the past 12 months? Yes    Have you had a dental exam in the past 12 months? --   Unknown   Are you checking your feet? Yes   Visually Impaired ,but seeing a Podiatrist   How many days per week are you checking your feet? --   Visual check done today. Had some callusses and corns on second toe knuckles on both feet. Very moist ,  boots were wet inside and socks were very moist . Concerned about fungus between digits     Dietary Intake   Breakfast --   Cheese extra sharp cheddar, potato chips- a good serving, beanie weanie small can, bottle water, or turkey sausage, grits eggs and coffee and biscuit every Wednesday   Beverage(s) --   Coffee , unsweet tea , water, and 40oz beer     Activity / Exercise   Activity / Exercise Type Light (walking / raking leaves)    How many days per week do you exercise? 7    How many minutes per day do you exercise? 30    Total minutes per week of exercise 210      Patient Education   Previous Diabetes Education Yes  (please comment)   Here   Healthy Eating Other (comment)   General healthy guidelines provided   Medications Reviewed patients medication for diabetes, action, purpose, timing of dose and side effects.   Gave him a pill box and asked him to follow up with [facility]     Individualized Goals (developed by patient)   Medications take my medication as prescribed      Post-Education Assessment   Patient understands incorporating nutritional management into lifestyle. Needs Review    Patient understands using medications safely. Needs Review      Outcomes   Expected Outcomes Demonstrated interest in learning but significant barriers to change             Individualized Plan for Diabetes Self-Management Training:   Learning Objective:  Patient will have a greater understanding of diabetes self-management. Patient education plan is to attend individual and/or group sessions per assessed needs and concerns.   Plan:   Patient Instructions  Please keep taking your medications.  Please drink only water, unsweetened tea, coffee.    Try to eat 3 balanced healthy meals a day.   Your blood sugar is getting better.    Please air your feet out often to keep them dry. Take the inserts out of your shoes to help them dry thoroughly. Change socks every day. Be sure to keep them dry also.  [name] [phone]   Expected Outcomes:  Demonstrated interest in learning but significant barriers to change  Education material provided: Diabetes Resources  If problems or questions, patient to contact team via:  Phone  Future DSME appointment:  4 wks  [name] Hole, RD 12/11/2023 4:12 PM.

## 2023-12-18 ENCOUNTER — Ambulatory Visit: Payer: MEDICAID | Admitting: Nurse Practitioner

## 2023-12-18 LAB — GLUCOSE, POCT (MANUAL RESULT ENTRY): POC Glucose: 199 mg/dL — AB (ref 70–99)

## 2023-12-25 ENCOUNTER — Other Ambulatory Visit (HOSPITAL_COMMUNITY): Payer: Self-pay

## 2023-12-25 ENCOUNTER — Encounter: Payer: Self-pay | Admitting: Nurse Practitioner

## 2023-12-25 ENCOUNTER — Other Ambulatory Visit: Payer: Self-pay | Admitting: Student

## 2023-12-25 ENCOUNTER — Ambulatory Visit: Payer: MEDICAID | Admitting: Nurse Practitioner

## 2023-12-25 MED ORDER — METFORMIN HCL 1000 MG PO TABS
1000.0000 mg | ORAL_TABLET | Freq: Two times a day (BID) | ORAL | 3 refills | Status: DC
  Filled 2023-12-25: qty 60, 30d supply, fill #0

## 2023-12-25 NOTE — Telephone Encounter (Signed)
Medication sent to pharmacy
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
## 2023-12-31 ENCOUNTER — Encounter: Payer: MEDICAID | Admitting: Student

## 2023-12-31 NOTE — Progress Notes (Deleted)
   Established Patient Office Visit  Subjective   Patient ID: Brady Church, male    DOB: May 06, 1961  Age: 63 y.o. MRN: [id number]  No chief complaint on file.   HPI  {History (Optional):23778}  ROS    Objective:     There were no vitals taken for this visit. {Vitals History (Optional):23777}  Physical Exam   No results found for any visits on 12/31/23.  {Labs (Optional):23779}  The ASCVD Risk score (Arnett DK, et al., 2019) failed to calculate for the following reasons:   Risk score cannot be calculated because patient has a medical history suggesting prior/existing ASCVD    Assessment & Plan:   Problem List Items Addressed This Visit   None   No follow-ups on file.    Faith Rogue, DO

## 2024-01-02 ENCOUNTER — Ambulatory Visit: Payer: MEDICAID

## 2024-01-02 LAB — GLUCOSE, POCT (MANUAL RESULT ENTRY): POC Glucose: 117 mg/dL — AB (ref 70–99)

## 2024-01-02 NOTE — Progress Notes (Unsigned)
 Returning pt. Reviewed meds, pt has not taken BP meds. Pt instructed to take BP meds and return for recheck. Gloves and hand warmers given.

## 2024-01-03 ENCOUNTER — Telehealth: Payer: Self-pay | Admitting: *Deleted

## 2024-01-03 ENCOUNTER — Ambulatory Visit (HOSPITAL_COMMUNITY): Payer: MEDICAID | Admitting: Physician Assistant

## 2024-01-03 NOTE — Telephone Encounter (Signed)
 Attempted to call patient. I was able to contact the [facility], but unfortunately the patient has left. I'll try again this afternoon.
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
## 2024-01-07 ENCOUNTER — Encounter: Payer: Self-pay | Admitting: Podiatry

## 2024-01-07 ENCOUNTER — Other Ambulatory Visit (HOSPITAL_COMMUNITY): Payer: Self-pay

## 2024-01-07 ENCOUNTER — Ambulatory Visit (INDEPENDENT_AMBULATORY_CARE_PROVIDER_SITE_OTHER): Payer: MEDICAID | Admitting: Podiatry

## 2024-01-07 VITALS — Ht 68.0 in | Wt 206.0 lb

## 2024-01-07 DIAGNOSIS — M79675 Pain in left toe(s): Secondary | ICD-10-CM | POA: Diagnosis not present

## 2024-01-07 DIAGNOSIS — M79674 Pain in right toe(s): Secondary | ICD-10-CM

## 2024-01-07 DIAGNOSIS — E1165 Type 2 diabetes mellitus with hyperglycemia: Secondary | ICD-10-CM

## 2024-01-07 DIAGNOSIS — R61 Generalized hyperhidrosis: Secondary | ICD-10-CM | POA: Diagnosis not present

## 2024-01-07 DIAGNOSIS — M2042 Other hammer toe(s) (acquired), left foot: Secondary | ICD-10-CM

## 2024-01-07 DIAGNOSIS — B351 Tinea unguium: Secondary | ICD-10-CM | POA: Diagnosis not present

## 2024-01-07 DIAGNOSIS — M2041 Other hammer toe(s) (acquired), right foot: Secondary | ICD-10-CM

## 2024-01-07 DIAGNOSIS — L84 Corns and callosities: Secondary | ICD-10-CM

## 2024-01-07 MED ORDER — MICONAZOLE NITRATE 2 % EX AERP
1.0000 [IU] | INHALATION_SPRAY | Freq: Every day | CUTANEOUS | 5 refills | Status: AC
Start: 2024-01-07 — End: ?
  Filled 2024-01-07: qty 60, fill #0

## 2024-01-07 NOTE — Progress Notes (Signed)
 Uncontrolled type 2 diabetes for nail care.  A1c was 11 2 months ago.  He has never gotten diabetic shoes.  Will give him a prescription for [facility] lab to have this completed.  He does need a deeper toebox for the hammertoes on the second toe with corns beginning.  He does have perspiration issues with macerated tissue.  Bilateral pinch calluses.      Subjective:  Patient ID: Brady Church, male    DOB: [birth year],  MRN: [id number]  Brady Church presents to clinic today for:  Chief Complaint  Patient presents with   DFc    He is here for a nail and callous trim, does not remember his last A1C level.    Patient notes nails are thick, discolored, elongated and painful in shoegear when trying to ambulate.  Patient's last A1c level was 11 two months ago.  He states that his family doctor has been changing his medicines to try to get this under better control.  He states that he has never gotten diabetic shoes before in the past.  His current shoes seem to be rubbing across the top of the second toes.  He also notes calluses on the great toes.  PCP is Lasandra Points, MD.  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 Stopped in 2010   Tobacco use disorder 10/07/2014    Past Surgical History:  Procedure Laterality Date   CATARACT EXTRACTION  01/08/2012   Right eye   EYE SURGERY     x 4 total     No Known Allergies  Review of Systems: Negative except as noted in the HPI.  Objective:  Brady Church is a pleasant 63 y.o. male in NAD. AAO x 3.  Vascular Examination: Capillary refill time is 3-5 seconds to toes bilateral. Palpable pedal pulses b/l LE.  Digital hair absent b/l.  Skin temperature gradient WNL b/l. No varicosities b/l. No cyanosis noted b/l.  Mild pitting edema bilateral ankles  Dermatological Examination: Pedal skin with normal turgor, texture and tone b/l. No open wounds. No interdigital macerations b/l. Toenails x10 are 4 mm thick, discolored, dystrophic with subungual debris. There is pain with compression of the nail plates.  They are elongated x10.  There are hyperkeratotic lesions on the plantar medial aspect the hallux IPJ bilateral.  There is also some maceration to the dorsal aspect of the second toe PIPJ bilateral.  No ulcer is noted.  Overall there is somewhat white macerated discoloration and texture to the plantar aspect of both feet consistent with hyperhidrosis  Neurological Examination: Decreased protective sensation intact bilateral LE.   Musculoskeletal Examination: Muscle strength 5/5 to all LE muscle groups b/l.  There are semirigid contractures of the second toe at the PIP joint level bilateral.     Latest Ref Rng & Units 10/16/2023   11:31 AM 08/29/2023   10:10 AM 02/21/2023   11:01 AM  Hemoglobin A1C  Hemoglobin-A1c 4.0 - 5.6 % 11.0  C 7.8  6.4     C Corrected result   Assessment/Plan: 1. Pain due to onychomycosis of toenails of both feet  2. Hyperhidrosis   3. Callus of foot   4. Uncontrolled type 2 diabetes mellitus with hyperglycemia (HCC)   5. Hammertoe of left foot   6. Hammertoe of right foot     Meds ordered this encounter  Medications   Miconazole  Nitrate 2 % AERP    Sig: Apply topically daily to feet daily before putting socks on    Dispense:  60 g    Refill:  5    The mycotic toenails were sharply debrided x10 with sterile nail nippers and a power debriding burr to decrease bulk/thickness and length.    Will send a prescription for miconazole  cream for the patient to apply in his socks and shoes daily.  He was given a prescription for diabetic shoes with a deep toe box to  accommodate the hammertoes and this will be filled at [facility] prost[facility].  The hyperkeratotic lesions were shaved with a sterile #[id number] blade at the bilateral hallux IPJ.  Return in about 3 months (around 04/05/2024) for [facility].   Joe Murders, DPM, FACFAS [facility]     [address] [place], [place] [address]                Office [phone]  Fax [phone]

## 2024-01-08 ENCOUNTER — Ambulatory Visit: Payer: MEDICAID | Admitting: Nurse Practitioner

## 2024-01-08 LAB — GLUCOSE, POCT (MANUAL RESULT ENTRY): POC Glucose: 229 mg/dL — AB (ref 70–99)

## 2024-01-08 NOTE — Progress Notes (Signed)
Returning Pt. Only wants VS and Blood Pressure check. Has not taken meds this morning. FSBS elevated but pt has not taken Metformin this morning and has had sugar with coffee this am.

## 2024-01-14 ENCOUNTER — Encounter: Payer: Self-pay | Admitting: Dietician

## 2024-01-14 ENCOUNTER — Encounter: Payer: MEDICAID | Admitting: Student

## 2024-01-22 ENCOUNTER — Encounter: Payer: MEDICAID | Admitting: Student

## 2024-01-28 ENCOUNTER — Encounter: Payer: MEDICAID | Admitting: Student

## 2024-01-29 ENCOUNTER — Ambulatory Visit: Payer: MEDICAID

## 2024-01-29 LAB — GLUCOSE, POCT (MANUAL RESULT ENTRY): POC Glucose: 232 mg/dL — AB (ref 70–99)

## 2024-01-29 NOTE — Progress Notes (Unsigned)
 Returning pt. Wants BP and CBG checked.

## 2024-02-05 ENCOUNTER — Ambulatory Visit: Payer: MEDICAID | Admitting: Internal Medicine

## 2024-02-05 ENCOUNTER — Encounter: Payer: Self-pay | Admitting: Internal Medicine

## 2024-02-05 LAB — GLUCOSE, POCT (MANUAL RESULT ENTRY): POC Glucose: 320 mg/dL — AB (ref 70–99)

## 2024-02-05 NOTE — Progress Notes (Signed)
 Returning pt. Requests CBG and VS, and assistance with eye drops. Pt blind, has recently broken cane. States he has someone bringing him one. Staying at [facility] in lobby.

## 2024-02-12 ENCOUNTER — Ambulatory Visit: Payer: MEDICAID

## 2024-02-12 LAB — GLUCOSE, POCT (MANUAL RESULT ENTRY): POC Glucose: 170 mg/dL — AB (ref 70–99)

## 2024-02-12 NOTE — Progress Notes (Signed)
 Returning pt. Pt has not taken any meds this am. Pt still has not found walking stick for blindness. Pt states that he will take meds next week before coming in and go take meds. Will recheck BP later today.

## 2024-02-19 ENCOUNTER — Ambulatory Visit: Payer: MEDICAID

## 2024-02-19 LAB — GLUCOSE, POCT (MANUAL RESULT ENTRY): POC Glucose: 196 mg/dL — AB (ref 70–99)

## 2024-02-19 NOTE — Progress Notes (Signed)
 Returning patient. Pt has not taken meds this am. Took while being assessed. Assisted with eye drops. Pt states [employer] is closed since the weather is warmer. Needs housing

## 2024-02-21 ENCOUNTER — Other Ambulatory Visit: Payer: Self-pay | Admitting: Internal Medicine

## 2024-02-21 ENCOUNTER — Other Ambulatory Visit (HOSPITAL_COMMUNITY): Payer: Self-pay

## 2024-02-22 ENCOUNTER — Other Ambulatory Visit (HOSPITAL_COMMUNITY): Payer: Self-pay

## 2024-02-22 MED ORDER — LOSARTAN POTASSIUM 25 MG PO TABS
25.0000 mg | ORAL_TABLET | Freq: Every day | ORAL | 0 refills | Status: AC
Start: 2024-02-22 — End: ?
  Filled 2024-02-22: qty 30, 30d supply, fill #0

## 2024-02-22 NOTE — Telephone Encounter (Signed)
 Refilled 30 days losartan;  patient needs [facility] f/u.

## 2024-02-26 ENCOUNTER — Ambulatory Visit (HOSPITAL_COMMUNITY): Payer: MEDICAID | Admitting: Clinical

## 2024-03-03 ENCOUNTER — Ambulatory Visit: Payer: MEDICAID

## 2024-03-03 LAB — GLUCOSE, POCT (MANUAL RESULT ENTRY): POC Glucose: 252 mg/dL — AB (ref 70–99)

## 2024-03-03 NOTE — Patient Instructions (Signed)
 Internal Medicine appt.  Thursday  03/06/24 @ 0915 am [address] entrance [place] [address]  Eye appt [address] [place], [place] [address] 03/10/24 @1115 

## 2024-03-03 NOTE — Progress Notes (Signed)
 Returning pt. Pt requests for me to call and find out appointments. Pt BP elevated and glucose elevated. Pt has not taken meds this morning and had OJ.   Internal Medicine appt.  Thursday  03/06/24 @ 0915 am [address] [place] [address]  Eye appt [address] [place], [place] [address] 03/10/24 @1115 

## 2024-03-05 ENCOUNTER — Other Ambulatory Visit (HOSPITAL_COMMUNITY): Payer: Self-pay

## 2024-03-06 ENCOUNTER — Other Ambulatory Visit (HOSPITAL_COMMUNITY): Payer: Self-pay

## 2024-03-06 ENCOUNTER — Ambulatory Visit (INDEPENDENT_AMBULATORY_CARE_PROVIDER_SITE_OTHER): Payer: MEDICAID | Admitting: Student

## 2024-03-06 ENCOUNTER — Other Ambulatory Visit (HOSPITAL_BASED_OUTPATIENT_CLINIC_OR_DEPARTMENT_OTHER): Payer: Self-pay

## 2024-03-06 VITALS — BP 136/91 | HR 76 | Temp 97.2°F | Ht 69.0 in | Wt 216.0 lb

## 2024-03-06 DIAGNOSIS — E1165 Type 2 diabetes mellitus with hyperglycemia: Secondary | ICD-10-CM

## 2024-03-06 DIAGNOSIS — I10 Essential (primary) hypertension: Secondary | ICD-10-CM

## 2024-03-06 DIAGNOSIS — Z7984 Long term (current) use of oral hypoglycemic drugs: Secondary | ICD-10-CM

## 2024-03-06 DIAGNOSIS — E785 Hyperlipidemia, unspecified: Secondary | ICD-10-CM | POA: Diagnosis not present

## 2024-03-06 DIAGNOSIS — I110 Hypertensive heart disease with heart failure: Secondary | ICD-10-CM

## 2024-03-06 DIAGNOSIS — I5042 Chronic combined systolic (congestive) and diastolic (congestive) heart failure: Secondary | ICD-10-CM

## 2024-03-06 DIAGNOSIS — L720 Epidermal cyst: Secondary | ICD-10-CM

## 2024-03-06 DIAGNOSIS — M79604 Pain in right leg: Secondary | ICD-10-CM | POA: Insufficient documentation

## 2024-03-06 DIAGNOSIS — E1136 Type 2 diabetes mellitus with diabetic cataract: Secondary | ICD-10-CM | POA: Diagnosis not present

## 2024-03-06 DIAGNOSIS — D2330 Other benign neoplasm of skin of unspecified part of face: Secondary | ICD-10-CM

## 2024-03-06 LAB — GLUCOSE, CAPILLARY: Glucose-Capillary: 186 mg/dL — ABNORMAL HIGH (ref 70–99)

## 2024-03-06 LAB — POCT GLYCOSYLATED HEMOGLOBIN (HGB A1C): Hemoglobin A1C: 7.4 % — AB (ref 4.0–5.6)

## 2024-03-06 MED ORDER — METFORMIN HCL 500 MG PO TABS
ORAL_TABLET | ORAL | 0 refills | Status: DC
Start: 2024-03-06 — End: 2024-03-22
  Filled 2024-03-06: qty 70, 35d supply, fill #0

## 2024-03-06 MED ORDER — LOSARTAN POTASSIUM 25 MG PO TABS
25.0000 mg | ORAL_TABLET | Freq: Every day | ORAL | 0 refills | Status: DC
  Filled 2024-03-06: qty 30, 30d supply, fill #0

## 2024-03-06 MED ORDER — ATORVASTATIN CALCIUM 80 MG PO TABS
80.0000 mg | ORAL_TABLET | Freq: Every day | ORAL | 3 refills | Status: DC
  Filled 2024-03-06: qty 30, 30d supply, fill #0

## 2024-03-06 NOTE — Assessment & Plan Note (Signed)
 Patient presents with a history of hypertension with a blood pressure today of 136/91. Their hypertension is controlled on a regimen of losartan 25mg .  Prior BMP in December was WNL.  He checks his BP and it it "fluctuates, up and down". Prior regimen for both HTN and HFrEF included spironolactone and coreg, both were held after hospitalization in December.  Plan: -Restart regimen of losartan 25 mg, if electrolytes are stable, will restart spironolactone as well  -BMP today  -1 month follow up, needs BMP at follow up

## 2024-03-06 NOTE — Assessment & Plan Note (Addendum)
 Patient reports right leg pain primarily in the gastrocnemius muscles that started a few months ago.  He stated that he has pain described as burning while walking and that his symptoms will continue at night when he is resting as well.  Throughout the day, he does not have any symptoms at rest.  He denies new sensation changes or paresthesias.  There is no tenderness to palpation of the right lower extremity on exam, DP pulses are present and equal bilaterally.  Bilateral feet are warm and dry.  In in office ABI was collected which was WNL, L: 1.28, R: 1.28. Although his ABI's were WNL, he has multiple risk factors for claudication. Patient was asked to keep a symptom log to better track when his pain improves/worsens.  Low suspicion for DVT given the chronicity, lack of pain to deep palpation, and the lack of asymmetrical lower extremity edema. Plan: -Lipid profile collected today -Patient is already on a regimen of ASA 81mg  and Lipitor, he needs refills for both.  -BMP

## 2024-03-06 NOTE — Assessment & Plan Note (Signed)
 Patient has a cyst like structure on his right face inferior to the right eye that he noticed one month prior. There is no pain associated with it. He denies fevers,chills. On exam, there was a soft, mobile small cyst inferior to the right eye. Low suspicion for abscess, there is no erythema or tenderness present. Plan: -Dermatology referral sent

## 2024-03-06 NOTE — Progress Notes (Signed)
 Established Patient Office Visit  Subjective   Patient ID: Brady Church, male    DOB: 09-Apr-1961  Age: 63 y.o. MRN: [id number]  Chief Complaint  Patient presents with   Follow-up    Routine office visit with medication refill / DM-did not want foot exam done today / been having pain at night in the right leg.    Brady Church is a 63 y.o. who presents to the clinic for a follow up of chronic conditions. Please see problem based assessment and plan for additional details.    Patient Active Problem List   Diagnosis Date Noted   Epidermal cyst of face 03/06/2024   Right leg pain 03/06/2024   Penetrating foot wound, left, subsequent encounter 11/30/2023   Lack of adequate food 11/25/2023   Puncture wound of plantar aspect of left foot with infection 11/24/2023   Syncope 11/23/2023   Alcohol abuse 11/23/2023   Cocaine abuse (HCC) 11/23/2023   Poorly controlled type 2 diabetes mellitus (HCC) 11/23/2023   Syncope due to orthostatic hypotension 11/23/2023   Legal blindness 10/23/2023   Nephrolithiasis 08/30/2023   Housing instability 08/24/2022   Neuropathic pain of hand, right 01/11/2022   Bilateral foot pain 07/08/2021   Vitamin B12 deficiency 08/06/2019   Erectile dysfunction 05/06/2018   Post-nasal drip 10/01/2017   Peripheral neuropathy 08/06/2017   Adjustment disorder with mixed anxiety and depressed mood    CVA (cerebral vascular accident) (HCC) 02/19/2017   Intracranial vascular stenosis 02/16/2017   Prolonged Q-T interval on ECG 02/16/2017   Cocaine use disorder, severe, dependence (HCC)    Chronic combined systolic and diastolic congestive heart failure (HCC) 05/12/2015   Alcohol use disorder, severe, dependence (HCC) 11/06/2014   History of tobacco use 10/07/2014   Healthcare maintenance 01/23/2014   Glaucoma 05/11/2012   Hyperlipidemia LDL goal <70 03/22/2012   Hypertension 01/17/2012   Diabetes mellitus with diabetic cataract (HCC) 03/08/2009       Objective:     BP (!) 136/91 (BP Location: Left Arm, Patient Position: Sitting, Cuff Size: Normal)   Pulse 76   Temp (!) 97.2 F (36.2 C) (Oral)   Ht 5\' 9"  (1.753 m)   Wt 216 lb (98 kg)   SpO2 98%   BMI 31.90 kg/m  BP Readings from Last 3 Encounters:  03/06/24 (!) 136/91  03/03/24 (!) 160/104  02/19/24 (!) 146/92   Wt Readings from Last 3 Encounters:  03/06/24 216 lb (98 kg)  03/03/24 215 lb 8 oz (97.8 kg)  02/19/24 212 lb (96.2 kg)      Physical Exam Vitals reviewed.  Constitutional:      General: He is not in acute distress.    Appearance: He is not ill-appearing, toxic-appearing or diaphoretic.  Cardiovascular:     Rate and Rhythm: Normal rate and regular rhythm.     Heart sounds: Normal heart sounds.  Pulmonary:     Effort: Pulmonary effort is normal.     Breath sounds: Normal breath sounds.  Musculoskeletal:     Right lower leg: No edema.     Left lower leg: No edema.     Comments: No right lower extremity tenderness to palpation, no tenderness to palpation of the gastrocnemius muscle, no asymmetrical swelling of the right lower extremity versus left lower extremity  Skin:    General: Skin is warm and dry.  Neurological:     Mental Status: He is alert.  Psychiatric:        Mood and Affect: Mood and  affect normal.    Results for orders placed or performed in visit on 03/06/24  Glucose, capillary  Result Value Ref Range   Glucose-Capillary 186 (H) 70 - 99 mg/dL  POC Hbg W1U  Result Value Ref Range   Hemoglobin A1C 7.4 (A) 4.0 - 5.6 %   HbA1c POC (<> result, manual entry)     HbA1c, POC (prediabetic range)     HbA1c, POC (controlled diabetic range)      Last metabolic panel Lab Results  Component Value Date   GLUCOSE 132 (H) 11/26/2023   NA 137 11/26/2023   K 4.0 11/26/2023   CL 104 11/26/2023   CO2 24 11/26/2023   BUN 13 11/26/2023   CREATININE 1.16 11/26/2023   GFRNONAA >60 11/26/2023   CALCIUM 9.5 11/26/2023   PHOS 2.5 04/19/2015   PROT  7.0 11/23/2023   ALBUMIN 3.7 11/23/2023   LABGLOB 2.9 07/05/2022   AGRATIO 1.5 07/05/2022   BILITOT 0.9 11/23/2023   ALKPHOS 51 11/23/2023   AST 46 (H) 11/23/2023   ALT 29 11/23/2023   ANIONGAP 9 11/26/2023   Last lipids Lab Results  Component Value Date   CHOL 170 11/07/2023   HDL 56 11/07/2023   LDLCALC 90 11/07/2023   TRIG 138 11/07/2023   CHOLHDL 3.0 11/07/2023   Last hemoglobin A1c Lab Results  Component Value Date   HGBA1C 7.4 (A) 03/06/2024      The ASCVD Risk score (Arnett DK, et al., 2019) failed to calculate for the following reasons:   Risk score cannot be calculated because patient has a medical history suggesting prior/existing ASCVD    Assessment & Plan:   Problem List Items Addressed This Visit       Cardiovascular and Mediastinum   Hypertension (Chronic)   Patient presents with a history of hypertension with a blood pressure today of 136/91. Their hypertension is controlled on a regimen of losartan 25mg .  Prior BMP in December was WNL.  He checks his BP and it it "fluctuates, up and down". Prior regimen for both HTN and HFrEF included spironolactone and coreg, both were held after hospitalization in December.  Plan: -Restart regimen of losartan 25 mg, if electrolytes are stable, will restart spironolactone as well  -BMP today  -1 month follow up, needs BMP at follow up       Relevant Medications   atorvastatin (LIPITOR) 80 MG tablet   losartan (COZAAR) 25 MG tablet   Other Relevant Orders   Basic metabolic panel with GFR   Chronic combined systolic and diastolic congestive heart failure (HCC) (Chronic)   Patient is not currently on GDMT. His GDMT was held during a hospitalization in December.  At his hospitalization follow-up, losartan was restarted and he was instructed to follow-up in 1 month to assess benefit of restarting additional GDMT including spironolactone, Coreg and, Lasix.  Patient unfortunately did not follow-up and has not taken his  losartan since January.  His blood pressure today is elevated at 136/91 and he is euvolemic on exam. -Will restart losartan 25 mg a day -BMP obtained, if electrolytes are within normal limits we will start spironolactone again -Patient was instructed to follow-up in 1 month to recheck blood pressure and BMP, if his blood pressure remains elevated will resume Coreg at that time      Relevant Medications   atorvastatin (LIPITOR) 80 MG tablet   losartan (COZAAR) 25 MG tablet     Endocrine   Diabetes mellitus with diabetic cataract (HCC) (Chronic)  Patient presents with a history of T2DM with a prior A1c of 11.0 in November.  Patient's A1c today is 7.4.  They were on a regimen of Metformin 1,000 mg BID; howver, he has not refilled metformin since January.  Plan: -Restart metformin at 500mg  and have him titrate up 500mg  more per week for the goal of 2,000mg  on week 4  -A1c today -Urine ACR ordered      Relevant Medications   atorvastatin (LIPITOR) 80 MG tablet   metFORMIN (GLUCOPHAGE) 500 MG tablet   losartan (COZAAR) 25 MG tablet   Other Relevant Orders   POC Hbg A1C (Completed)   Glucose, capillary (Completed)   Microalbumin / Creatinine Urine Ratio   Basic metabolic panel with GFR   Poorly controlled type 2 diabetes mellitus (HCC) - Primary   Relevant Medications   atorvastatin (LIPITOR) 80 MG tablet   metFORMIN (GLUCOPHAGE) 500 MG tablet   losartan (COZAAR) 25 MG tablet     Musculoskeletal and Integument   Epidermal cyst of face   Patient has a cyst like structure on his right face inferior to the right eye that he noticed one month prior. There is no pain associated with it. He denies fevers,chills. On exam, there was a soft, mobile small cyst inferior to the right eye. Low suspicion for abscess, there is no erythema or tenderness present. Plan: -Dermatology referral sent       Relevant Orders   Ambulatory referral to Dermatology     Other   Hyperlipidemia LDL goal <70  (Chronic)   Lipid profile ordered for today, patient has been without Lipitor for over one month now.       Relevant Medications   atorvastatin (LIPITOR) 80 MG tablet   losartan (COZAAR) 25 MG tablet   Other Relevant Orders   Lipid Profile   Right leg pain   Patient reports right leg pain primarily in the gastrocnemius muscles that started a few months ago.  He stated that he has pain described as burning while walking and that his symptoms will continue at night when he is resting as well.  Throughout the day, he does not have any symptoms at rest.  He denies new sensation changes or paresthesias.  There is no tenderness to palpation of the right lower extremity on exam, DP pulses are present and equal bilaterally.  Bilateral feet are warm and dry.  In in office ABI was collected which was WNL, L: 1.28, R: 1.28. Although his ABI's were WNL, he has multiple risk factors for claudication. Patient was asked to keep a symptom log to better track when his pain improves/worsens.  Low suspicion for DVT given the chronicity, lack of pain to deep palpation, and the lack of asymmetrical lower extremity edema. Plan: -Lipid profile collected today -Patient is already on a regimen of ASA 81mg  and Lipitor, he needs refills for both.  -BMP       Return in about 4 weeks (around 04/03/2024) for HTN, needs BMP at this visit .    Faith Rogue, DO

## 2024-03-06 NOTE — Patient Instructions (Addendum)
 Thank you, Mr.Brady Church for allowing [place] to provide your care today. Today we discussed blood pressure, diabetes, and leg pain.  Please pick up aspirin from the pharmacy.    Please have somebody assist you with the metformin changes:  First week:Take 500mg  every morning for one week  Second week:Take 500 mg every morning and night for one week  Third week:Take 1,000 mg every morning and 500 mg every night for one week  Forth week: Take 1,000 mg every morning and 1,000 mg every night  I have ordered the following labs for you:   Lab Orders         Glucose, capillary         Microalbumin / Creatinine Urine Ratio         Basic metabolic panel with GFR         Lipid Profile         POC Hbg A1C       Referrals ordered today:   Referral Orders  No referral(s) requested today     I have ordered the following medication/changed the following medications:   Stop the following medications: Medications Discontinued During This Encounter  Medication Reason   atorvastatin (LIPITOR) 80 MG tablet Reorder   metFORMIN (GLUCOPHAGE) 1000 MG tablet Dose change   losartan (COZAAR) 25 MG tablet Reorder     Start the following medications: Meds ordered this encounter  Medications   atorvastatin (LIPITOR) 80 MG tablet    Sig: Take 1 tablet (80 mg total) by mouth daily at 6 PM.    Dispense:  30 tablet    Refill:  3   metFORMIN (GLUCOPHAGE) 500 MG tablet    Sig: Take 500mg  every morning for one week Take 500 mg every morning and night for one week Take 1,000 mg every morning and 500 mg every night for one week Take 1,000 mg every morning and 1,000 mg every night    Dispense:  70 tablet    Refill:  0   losartan (COZAAR) 25 MG tablet    Sig: Take 1 tablet (25 mg total) by mouth daily.    Dispense:  30 tablet    Refill:  0    Patient needs /fu appointment     Follow up: 1 month    Should you have any questions or concerns please call the internal medicine clinic at [phone].      Please note that our late policy has changed.  If you are more than 15 minutes late to your appointment, you may be asked to reschedule your appointment.  Dr. Hessie Diener, D.O. [facility]

## 2024-03-06 NOTE — Assessment & Plan Note (Signed)
 Patient presents with a history of T2DM with a prior A1c of 11.0 in November.  Patient's A1c today is 7.4.  They were on a regimen of Metformin 1,000 mg BID; howver, he has not refilled metformin since January.  Plan: -Restart metformin at 500mg  and have him titrate up 500mg  more per week for the goal of 2,000mg  on week 4  -A1c today -Urine ACR ordered

## 2024-03-06 NOTE — Assessment & Plan Note (Signed)
 Lipid profile ordered for today, patient has been without Lipitor for over one month now.

## 2024-03-06 NOTE — Assessment & Plan Note (Signed)
 Patient is not currently on GDMT. His GDMT was held during a hospitalization in December.  At his hospitalization follow-up, losartan was restarted and he was instructed to follow-up in 1 month to assess benefit of restarting additional GDMT including spironolactone, Coreg and, Lasix.  Patient unfortunately did not follow-up and has not taken his losartan since January.  His blood pressure today is elevated at 136/91 and he is euvolemic on exam. -Will restart losartan 25 mg a day -BMP obtained, if electrolytes are within normal limits we will start spironolactone again -Patient was instructed to follow-up in 1 month to recheck blood pressure and BMP, if his blood pressure remains elevated will resume Coreg at that time

## 2024-03-07 LAB — BASIC METABOLIC PANEL WITH GFR
BUN/Creatinine Ratio: 24 (ref 10–24)
BUN: 20 mg/dL (ref 8–27)
CO2: 23 mmol/L (ref 20–29)
Calcium: 9.2 mg/dL (ref 8.6–10.2)
Chloride: 100 mmol/L (ref 96–106)
Creatinine, Ser: 0.84 mg/dL (ref 0.76–1.27)
Glucose: 186 mg/dL — ABNORMAL HIGH (ref 70–99)
Potassium: 4.3 mmol/L (ref 3.5–5.2)
Sodium: 139 mmol/L (ref 134–144)
eGFR: 99 mL/min/1.73

## 2024-03-07 LAB — LIPID PANEL
Chol/HDL Ratio: 3.2 ratio (ref 0.0–5.0)
Cholesterol, Total: 207 mg/dL — ABNORMAL HIGH (ref 100–199)
HDL: 65 mg/dL (ref >39)
LDL Chol Calc (NIH): 125 mg/dL — ABNORMAL HIGH (ref 0–99)
Triglycerides: 93 mg/dL (ref 0–149)
VLDL Cholesterol Cal: 17 mg/dL (ref 5–40)

## 2024-03-07 NOTE — Progress Notes (Signed)
 Internal Medicine Clinic Attending  Case discussed with the resident at the time of the visit.  We reviewed the resident's history and exam and pertinent patient test results.  I agree with the assessment, diagnosis, and plan of care documented in the resident's note.    I agree with Dr Larrie Kass plan to restart Spironolactone and Metformin. Since adherence to Atorvastatin hasn't been great, we won't add Zetia now, but follow up lipid panel & adherence in 3 months

## 2024-03-08 LAB — MICROALBUMIN / CREATININE URINE RATIO
Creatinine, Urine: 181.7 mg/dL
Microalb/Creat Ratio: 81 mg/g{creat} — ABNORMAL HIGH (ref 0–29)
Microalbumin, Urine: 147.6 ug/mL

## 2024-03-10 ENCOUNTER — Other Ambulatory Visit (HOSPITAL_COMMUNITY): Payer: Self-pay

## 2024-03-10 MED ORDER — SIMBRINZA 1-0.2 % OP SUSP
1.0000 [drp] | Freq: Two times a day (BID) | OPHTHALMIC | 3 refills | Status: AC
  Filled 2024-03-10: qty 8, 80d supply, fill #0

## 2024-03-10 MED ORDER — RHOPRESSA 0.02 % OP SOLN
1.0000 [drp] | Freq: Every evening | OPHTHALMIC | 3 refills | Status: AC
  Filled 2024-03-10: qty 2.5, 50d supply, fill #0

## 2024-03-10 MED ORDER — LUMIGAN 0.01 % OP SOLN
1.0000 [drp] | Freq: Every evening | OPHTHALMIC | 3 refills | Status: AC
Start: 2024-03-10 — End: ?
  Filled 2024-03-10: qty 2.5, 50d supply, fill #0

## 2024-03-10 MED ORDER — TIMOLOL MALEATE 0.5 % OP SOLN
1.0000 [drp] | Freq: Two times a day (BID) | OPHTHALMIC | 3 refills | Status: AC
  Filled 2024-03-10: qty 5, 50d supply, fill #0

## 2024-03-11 ENCOUNTER — Other Ambulatory Visit (HOSPITAL_COMMUNITY): Payer: Self-pay

## 2024-03-11 ENCOUNTER — Encounter: Payer: Self-pay | Admitting: Nurse Practitioner

## 2024-03-11 ENCOUNTER — Ambulatory Visit: Payer: MEDICAID | Admitting: Nurse Practitioner

## 2024-03-11 VITALS — BP 120/84 | HR 88 | Temp 97.7°F | Resp 16 | Wt 215.4 lb

## 2024-03-11 DIAGNOSIS — I10 Essential (primary) hypertension: Secondary | ICD-10-CM

## 2024-03-11 DIAGNOSIS — E1136 Type 2 diabetes mellitus with diabetic cataract: Secondary | ICD-10-CM

## 2024-03-11 LAB — GLUCOSE, POCT (MANUAL RESULT ENTRY): POC Glucose: 254 mg/dL — AB (ref 70–99)

## 2024-03-11 NOTE — Progress Notes (Signed)
 Seen at [facility] for vitals check. Just ate. Asked for help with eye drops. Eye drops placed in eyes for patient who has visual impairment. He has cane for assistance walking and to be visible.

## 2024-03-18 ENCOUNTER — Ambulatory Visit: Payer: MEDICAID

## 2024-03-18 ENCOUNTER — Other Ambulatory Visit (HOSPITAL_COMMUNITY): Payer: Self-pay

## 2024-03-18 LAB — GLUCOSE, POCT (MANUAL RESULT ENTRY): POC Glucose: 144 mg/dL — AB (ref 70–99)

## 2024-03-18 NOTE — Progress Notes (Signed)
 Returning pt. Requests VS. Has not not taken BP or glucose meds today. Plans to go to [employer] for lunch today.

## 2024-03-21 ENCOUNTER — Encounter (HOSPITAL_COMMUNITY): Payer: Self-pay | Admitting: Emergency Medicine

## 2024-03-21 ENCOUNTER — Other Ambulatory Visit: Payer: Self-pay

## 2024-03-21 ENCOUNTER — Observation Stay (HOSPITAL_COMMUNITY)
Admission: EM | Admit: 2024-03-21 | Discharge: 2024-03-22 | Disposition: A | Discharge status: Home / Self Care | Payer: MEDICAID | Attending: Internal Medicine | Admitting: Internal Medicine

## 2024-03-21 ENCOUNTER — Emergency Department (HOSPITAL_COMMUNITY): Payer: MEDICAID

## 2024-03-21 DIAGNOSIS — I5042 Chronic combined systolic (congestive) and diastolic (congestive) heart failure: Secondary | ICD-10-CM

## 2024-03-21 DIAGNOSIS — Z79899 Other long term (current) drug therapy: Secondary | ICD-10-CM | POA: Insufficient documentation

## 2024-03-21 DIAGNOSIS — I5043 Acute on chronic combined systolic (congestive) and diastolic (congestive) heart failure: Secondary | ICD-10-CM | POA: Diagnosis not present

## 2024-03-21 DIAGNOSIS — E119 Type 2 diabetes mellitus without complications: Secondary | ICD-10-CM | POA: Diagnosis not present

## 2024-03-21 DIAGNOSIS — J9601 Acute respiratory failure with hypoxia: Secondary | ICD-10-CM | POA: Diagnosis not present

## 2024-03-21 DIAGNOSIS — I5021 Acute systolic (congestive) heart failure: Principal | ICD-10-CM | POA: Insufficient documentation

## 2024-03-21 DIAGNOSIS — F1410 Cocaine abuse, uncomplicated: Secondary | ICD-10-CM | POA: Diagnosis not present

## 2024-03-21 DIAGNOSIS — Z1152 Encounter for screening for COVID-19: Secondary | ICD-10-CM | POA: Insufficient documentation

## 2024-03-21 DIAGNOSIS — Z7984 Long term (current) use of oral hypoglycemic drugs: Secondary | ICD-10-CM | POA: Diagnosis not present

## 2024-03-21 DIAGNOSIS — I110 Hypertensive heart disease with heart failure: Secondary | ICD-10-CM | POA: Insufficient documentation

## 2024-03-21 DIAGNOSIS — H409 Unspecified glaucoma: Secondary | ICD-10-CM | POA: Diagnosis not present

## 2024-03-21 DIAGNOSIS — I10 Essential (primary) hypertension: Secondary | ICD-10-CM

## 2024-03-21 DIAGNOSIS — Z7901 Long term (current) use of anticoagulants: Secondary | ICD-10-CM | POA: Diagnosis not present

## 2024-03-21 DIAGNOSIS — Z59819 Housing instability, housed unspecified: Secondary | ICD-10-CM | POA: Diagnosis not present

## 2024-03-21 DIAGNOSIS — I509 Heart failure, unspecified: Secondary | ICD-10-CM

## 2024-03-21 DIAGNOSIS — E785 Hyperlipidemia, unspecified: Secondary | ICD-10-CM | POA: Insufficient documentation

## 2024-03-21 DIAGNOSIS — R0602 Shortness of breath: Secondary | ICD-10-CM | POA: Diagnosis present

## 2024-03-21 DIAGNOSIS — E1136 Type 2 diabetes mellitus with diabetic cataract: Secondary | ICD-10-CM

## 2024-03-21 LAB — I-STAT CHEM 8, ED
BUN: 16 mg/dL (ref 8–23)
Calcium, Ion: 1.09 mmol/L — ABNORMAL LOW (ref 1.15–1.40)
Chloride: 105 mmol/L (ref 98–111)
Creatinine, Ser: 1 mg/dL (ref 0.61–1.24)
Glucose, Bld: 157 mg/dL — ABNORMAL HIGH (ref 70–99)
HCT: 39 % (ref 39.0–52.0)
Hemoglobin: 13.3 g/dL (ref 13.0–17.0)
Potassium: 3.6 mmol/L (ref 3.5–5.1)
Sodium: 139 mmol/L (ref 135–145)
TCO2: 27 mmol/L (ref 22–32)

## 2024-03-21 LAB — COMPREHENSIVE METABOLIC PANEL WITH GFR
ALT: 32 U/L (ref 0–44)
AST: 40 U/L (ref 15–41)
Albumin: 3.4 g/dL — ABNORMAL LOW (ref 3.5–5.0)
Alkaline Phosphatase: 57 U/L (ref 38–126)
Anion gap: 11 (ref 5–15)
BUN: 15 mg/dL (ref 8–23)
CO2: 23 mmol/L (ref 22–32)
Calcium: 8.7 mg/dL — ABNORMAL LOW (ref 8.9–10.3)
Chloride: 104 mmol/L (ref 98–111)
Creatinine, Ser: 0.91 mg/dL (ref 0.61–1.24)
GFR, Estimated: >60 mL/min (ref >60)
Glucose, Bld: 160 mg/dL — ABNORMAL HIGH (ref 70–99)
Potassium: 3.6 mmol/L (ref 3.5–5.1)
Sodium: 138 mmol/L (ref 135–145)
Total Bilirubin: 0.9 mg/dL (ref 0.0–1.2)
Total Protein: 6.9 g/dL (ref 6.5–8.1)

## 2024-03-21 LAB — RESP PANEL BY RT-PCR (RSV, FLU A&B, COVID)  RVPGX2
Influenza A by PCR: NEGATIVE
Influenza B by PCR: NEGATIVE
Resp Syncytial Virus by PCR: NEGATIVE
SARS Coronavirus 2 by RT PCR: NEGATIVE

## 2024-03-21 LAB — CBC
HCT: 38.4 % — ABNORMAL LOW (ref 39.0–52.0)
Hemoglobin: 13.4 g/dL (ref 13.0–17.0)
MCH: 32.1 pg (ref 26.0–34.0)
MCHC: 34.9 g/dL (ref 30.0–36.0)
MCV: 91.9 fL (ref 80.0–100.0)
Platelets: 306 10*3/uL (ref 150–400)
RBC: 4.18 MIL/uL — ABNORMAL LOW (ref 4.22–5.81)
RDW: 14.1 % (ref 11.5–15.5)
WBC: 7.1 10*3/uL (ref 4.0–10.5)
nRBC: 0 % (ref 0.0–0.2)

## 2024-03-21 LAB — I-STAT VENOUS BLOOD GAS, ED
Acid-Base Excess: 1 mmol/L (ref 0.0–2.0)
Bicarbonate: 26.3 mmol/L (ref 20.0–28.0)
Calcium, Ion: 1.09 mmol/L — ABNORMAL LOW (ref 1.15–1.40)
HCT: 38 % — ABNORMAL LOW (ref 39.0–52.0)
Hemoglobin: 12.9 g/dL — ABNORMAL LOW (ref 13.0–17.0)
O2 Saturation: 99 %
Potassium: 3.6 mmol/L (ref 3.5–5.1)
Sodium: 139 mmol/L (ref 135–145)
TCO2: 28 mmol/L (ref 22–32)
pCO2, Ven: 43.5 mmHg — ABNORMAL LOW (ref 44–60)
pH, Ven: 7.389 (ref 7.25–7.43)
pO2, Ven: 130 mmHg — ABNORMAL HIGH (ref 32–45)

## 2024-03-21 LAB — BRAIN NATRIURETIC PEPTIDE: B Natriuretic Peptide: 180.4 pg/mL — ABNORMAL HIGH (ref 0.0–100.0)

## 2024-03-21 LAB — TROPONIN I (HIGH SENSITIVITY)
Troponin I (High Sensitivity): 30 ng/L — ABNORMAL HIGH (ref <18)
Troponin I (High Sensitivity): 30 ng/L — ABNORMAL HIGH (ref <18)

## 2024-03-21 MED ORDER — FUROSEMIDE 10 MG/ML IJ SOLN: 80.0000 mg | Freq: Once | INTRAMUSCULAR | Status: DC

## 2024-03-21 MED ORDER — ENOXAPARIN SODIUM 40 MG/0.4ML IJ SOSY
40.0000 mg | PREFILLED_SYRINGE | INTRAMUSCULAR | Status: DC
  Administered 2024-03-21 – 2024-03-22 (×2): 40 mg via SUBCUTANEOUS
  Filled 2024-03-21 (×2): qty 0.4

## 2024-03-21 MED ORDER — LATANOPROST 0.005 % OP SOLN
1.0000 [drp] | Freq: Every day | OPHTHALMIC | Status: DC
  Administered 2024-03-21: 1 [drp] via OPHTHALMIC
  Filled 2024-03-21: qty 2.5

## 2024-03-21 MED ORDER — ATORVASTATIN CALCIUM 80 MG PO TABS
80.0000 mg | ORAL_TABLET | Freq: Every day | ORAL | Status: DC
  Administered 2024-03-21: 80 mg via ORAL
  Filled 2024-03-21: qty 1

## 2024-03-21 MED ORDER — LOSARTAN POTASSIUM 25 MG PO TABS
25.0000 mg | ORAL_TABLET | Freq: Every day | ORAL | Status: DC
  Administered 2024-03-21 – 2024-03-22 (×2): 25 mg via ORAL
  Filled 2024-03-21 (×2): qty 1

## 2024-03-21 MED ORDER — FUROSEMIDE 10 MG/ML IJ SOLN
40.0000 mg | Freq: Once | INTRAMUSCULAR | Status: AC
  Administered 2024-03-21: 40 mg via INTRAVENOUS
  Filled 2024-03-21: qty 4

## 2024-03-21 NOTE — ED Notes (Signed)
 Hospitalist at bedside

## 2024-03-21 NOTE — H&P (Signed)
 Date: 03/21/2024               Patient Name:  Brady Church MRN: [id number]  DOB: [birth year] Age / Sex: 63 y.o., male   PCP: Lasandra Points, MD (Inactive)         Medical Service: Internal Medicine Teaching Service         Attending Physician: Dr. Tod Forward, DO      First Contact: Marni Sins, MD    Second Contact: Dr. Chrissie Coupe, MD          After Hours (After 5p/  First Contact Pager: [phone]  weekends / holidays): Second Contact Pager: [phone]   SUBJECTIVE   Chief Complaint: shortness of breath  History of Present Illness:  Brady Church is a 63 year old male with a past medical history of homelessness, HFrEF (EF 30-35%), polysubstance use disorder (alcohol and cocaine use), type 2 diabetes, hyperlipidemia, hypertension, glaucoma and legal blindness who presented to the emergency via [facility] with acute shortness of breath and cough. Known homeless, polysubstance use abuser. He states that he started developing a cough last week, but has gotten worse in terms of frequency and intensity. He denies any sputum production, as well as any recent fevers, chills, nausea, vomiting, diarrhea, abdominal pain. He does state that he recently smoked crack cocaine on Wednesday night. He has not been taking any of his medications for the last two weeks either, as he has not been able to afford them as he hasn't gotten his check yet.   ED Course: Received nitroglycerin, Solu-Medrol, IV mag, DuoNebs and route by EMS Hypertensive on presentation Started on BiPAP with improvement in symptoms Chest x-ray with no active cardiopulmonary process CBC, BMP unremarkable Troponin 30 Given 40 mg IV Lasix  VBG with pH within normal limits EKG was sinus rhythm, no acute ischemic changes  Meds:  No outpatient medications have been marked as taking for the 03/21/24 encounter [facility] Encounter).  Atorvastatin  80mg   Losartan  25mg   Metformin  500mg  Spironolactone   25mg    Past Medical History: Past Medical History:  Diagnosis Date   Alcohol use disorder, severe, dependence (HCC) 11/06/2014   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 Stopped in 2010   Tobacco use disorder 10/07/2014   Past Surgical History: Past Surgical History:  Procedure Laterality Date   CATARACT EXTRACTION  01/08/2012   Right eye   EYE SURGERY     x 4 total    Social:  Lives: Hom[facility] Occupation: unemployed Level of Function: independent in ADLs PCP: Lasandra Points, MD (Inactive) Substances: Cocaine and alcohol use; no tobacco or other drug use  Family History:  Family History  Problem Relation Age of Onset   Coronary artery disease Mother 67       died of MI   Cancer Father 60       Some GI cancer. Not sure if it was Colon Cancer or not.   Hypertension Father    Diabetes Maternal Grandmother    Allergies: Allergies as of 03/21/2024   (No Known Allergies)   Review of Systems: A complete ROS was negative except as per HPI.   OBJECTIVE:   Physical Exam: Blood  pressure (!) 160/116, pulse 70, resp. rate 11, weight 97 kg, SpO2 100%.  Constitutional: well-appearing male sitting in bed, in no acute distress HENT: normocephalic atraumatic, mucous membranes moist Eyes: conjunctiva non-erythematous Neck: supple Cardiovascular: regular rate and rhythm, no m/r/g Pulmonary/Chest: normal work of breathing on room air, lungs clear to auscultation bilaterally Abdominal: soft, non-tender, non-distended MSK: normal bulk and tone Neurological: alert & oriented x 3, 5/5 strength in bilateral upper and lower extremities, normal gait Skin: warm and dry Psych: normal mood and affect  Labs: CBC    Component  Value Date/Time   WBC 7.1 03/21/2024 0306   RBC 4.18 (L) 03/21/2024 0306   HGB 13.3 03/21/2024 0340   HGB 12.9 (L) 03/21/2024 0340   HGB 13.6 08/06/2019 1036   HCT 39.0 03/21/2024 0340   HCT 38.0 (L) 03/21/2024 0340   HCT 38.1 08/06/2019 1036   PLT 306 03/21/2024 0306   PLT 227 08/06/2019 1036   MCV 91.9 03/21/2024 0306   MCV 94 08/06/2019 1036   MCH 32.1 03/21/2024 0306   MCHC 34.9 03/21/2024 0306   RDW 14.1 03/21/2024 0306   RDW 14.1 08/06/2019 1036   LYMPHSABS 1.6 11/23/2023 0648   MONOABS 0.4 11/23/2023 0648   EOSABS 0.2 11/23/2023 0648   BASOSABS 0.0 11/23/2023 0648     CMP     Component Value Date/Time   NA 139 03/21/2024 0340   NA 139 03/21/2024 0340   NA 139 03/06/2024 1110   K 3.6 03/21/2024 0340   K 3.6 03/21/2024 0340   CL 105 03/21/2024 0340   CO2 23 03/21/2024 0306   GLUCOSE 157 (H) 03/21/2024 0340   BUN 16 03/21/2024 0340   BUN 20 03/06/2024 1110   CREATININE 1.00 03/21/2024 0340   CREATININE 0.93 01/07/2015 1039   CALCIUM  8.7 (L) 03/21/2024 0306   PROT 6.9 03/21/2024 0306   PROT 7.3 07/05/2022 1034   ALBUMIN 3.4 (L) 03/21/2024 0306   ALBUMIN 4.4 07/05/2022 1034   AST 40 03/21/2024 0306   ALT 32 03/21/2024 0306   ALKPHOS 57 03/21/2024 0306   BILITOT 0.9 03/21/2024 0306   BILITOT 0.3 07/05/2022 1034   GFRNONAA >60 03/21/2024 0306   GFRNONAA >89 01/07/2015 1039   GFRAA 102 02/11/2020 1023   GFRAA >89 01/07/2015 1039    Imaging: DG Chest Port 1 View CLINICAL DATA:  Respiratory distress  EXAM: PORTABLE CHEST 1 VIEW  COMPARISON:  11/22/2023  FINDINGS: The heart size and mediastinal contours are within normal limits. Both lungs are clear. The visualized skeletal structures are unremarkable.  IMPRESSION: No active disease.  Electronically Signed   By: Juanetta Nordmann M.D.   On: 03/21/2024 03:38  EKG: personally reviewed my interpretation is sinus rhythm, no new ischemic changes  ASSESSMENT & PLAN:  Assessment & Plan by  Problem: Active Problems:   Acute Heart Failure Exacerbation   Brady Church is a 63 y.o. person living with a history of HFrEF (EF 30-35%), polysubstance use disorder (alcohol and cocaine use), type 2 diabetes, hyperlipidemia, hypertension, glaucoma and legal blindness who presented with acute shortness of breath and is admitted for acute hypoxic respiratory failure on hospital day 0  Acute hypoxic respiratory failure HFrEF (EF 30-35%) Suspect heart failure exacerbation in the setting of poor medication adherence. He was previously started on GDMT, but has not been taking it. Recent cocaine use may be contributory, as he was previously admitted for presumed cocaine-induced pulmonary edema. Given reported cough for 1 week, may also have infectious  component, though he is afebrile with no leukocytosis, and CXR was unremarkable. Echocardiogram in December 2024 showed EF 30-35%, global hypokinesis of the LV. Weight is about the same as when he was admitted in December 2024. On BiPAP, satting well and appears comfortable. No acidosis, no hypercapnia. Will treat as HF exacerbation, thankfully he is already on room air.  - Will give another dose of IV Lasix  40 mg  - Restart losartan  25 mg daily; holding coreg  in the setting of CHF exacerbation - Can consider adding on Spironolactone  if BP allows - Strict I's and O's - Trending BMP - Respiratory viral panel - Ambulation with pulse oximetry  Chronic conditions: Alcohol use disorder: Last drink was. CIWA protocol. Thiamine , multivitamin.  Hypertension: Hypertensive on presentation, 160/116. On losartan  25 mg daily. Per recent PCP visit, plan was to restart spironolactone , which we will do while he is hospitalized.  Type 2 diabetes: Glucose 160 on presentation. Recent A1c 7.4 two weeks ago. On metformin  500 mg daily, tapering to 2,000 mg daily. Held for now.  HLD: Recent lipid panel with LDL 125. On Atorvastatin  80 mg daily and aspirin  81 mg daily,  continued.  Glaucoma: Continued brimonidine , fluorometholone , and netarsudil -latanoprost  eye drops. Housing instability: [facility] TOC consult for resources and help securing housing.  Diet: NPO on bipap VTE: Enoxaparin  IVF: None Code: Full  Dispo: Admit patient to Observation with expected length of stay less than 2 midnights.  Signed: Kourtney Terriquez, MD Internal Medicine Resident, PGY-2 [facility]   03/21/2024, 5:59 AM

## 2024-03-21 NOTE — ED Notes (Signed)
RT and EDP at bedside 

## 2024-03-21 NOTE — ED Triage Notes (Signed)
 [facility] c/o cough x 1 week, hx of chf.  EMS placed on CPAP d/t rales and wheezing.  EMS reports significant improvement with CPAP.  125 mg solu-medrol 2 g mag .4 nitroglycerin 1 duoneb 150/100 18-24 RR 80 HR 100 % on CPAP Bilateral 18 g in [facility]

## 2024-03-21 NOTE — ED Notes (Signed)
 Ambulated pt on room air. Started at 93% and went up to 99% and stayed at that range during ambulation.

## 2024-03-21 NOTE — ED Provider Notes (Signed)
 [place] [facility] DE[facility] AT [facility] Provider Note   CSN: [id number] Arrival date & time: 03/21/24  0257     History  Chief Complaint  Patient presents with   Shortness of Breath    Brady Church is a 63 y.o. male patient brought in by EMS from [facility] and complaining of cough x 1 week with worsening shortness of breath tonight.  History of CHF but states has been without his medications for significant of time.  Patient received nitroglycerin, Solu-Medrol, 2 g of mag, DuoNeb and route.  Pressures were hypertensive at time of their arrival but improved with nitro.  Placed on CPAP with improvement in patient's symptoms.  I reviewed his medical records.  History of diabetes, CVA, CHF, prolonged QT, polysubstance use.  He supposed to be on Lasix  40 mg twice daily but is currently not taking this due to difficulty with access.  HPI     Home Medications Prior to Admission medications   Medication Sig Start Date End Date Taking? Authorizing Provider  aspirin  EC 81 MG tablet Take 1 tablet (81 mg total) by mouth daily. Swallow whole. Patient not taking: Reported on 02/19/2024 10/02/23   Narendra, Nischal, MD  atorvastatin  (LIPITOR ) 80 MG tablet Take 1 tablet (80 mg total) by mouth daily at 6 PM. 03/06/24   Aurora Lees, DO  bimatoprost  (LUMIGAN ) 0.01 % SOLN Place 1 drop into the right eye every evening. 03/10/24     brimonidine  (ALPHAGAN ) 0.15 % ophthalmic solution Place 1 drop into both eyes 2 (two) times daily. 10/02/23   Narendra, Nischal, MD  Brinzolamide -Brimonidine  (SIMBRINZA ) 1-0.2 % SUSP Place 1 drop into the right eye 2 (two) times daily. 03/10/24     carvedilol  (COREG ) 3.125 MG tablet Take 1 tablet (3.125 mg total) by mouth 2 (two) times daily with a meal. 10/02/23   Jeffie Mingle, MD  fluorometholone  (FML) 0.1 % ophthalmic suspension Place 1 drop into both eyes daily. 10/02/23   Narendra, Nischal, MD  folic acid  (FOLVITE ) 1 MG tablet Take 1 tablet (1 mg  total) by mouth daily. Patient not taking: Reported on 02/19/2024 11/30/23   Ronni Colace, DO  furosemide  (LASIX ) 40 MG tablet Take 1 tablet (40 mg total) by mouth 2 (two) times daily. 10/15/23   Ruddy Corral M, PA-C  losartan  (COZAAR ) 25 MG tablet Take 1 tablet (25 mg total) by mouth daily. 03/06/24   Aurora Lees, DO  metFORMIN  (GLUCOPHAGE ) 500 MG tablet Take 1 Tablet (500mg ) every morning for one week Take 1 tablet (500 mg) every morning and night for one week Take 2 tablets (1,000 mg) every morning and 1 tablet (500 mg) every night for one week Take 2 tablets (1,000 mg) every morning and every night 03/06/24   Aurora Lees, DO  Miconazole  Nitrate 2 % AERP Apply topically daily to feet daily before putting socks on Patient not taking: Reported on 02/19/2024 01/07/24   McCaughan, Dia D, DPM  Multiple Vitamin (MULTIVITAMIN WITH MINERALS) TABS tablet Take 1 tablet by mouth daily. Patient not taking: Reported on 02/19/2024 11/30/23   Ronni Colace, DO  Netarsudil  Dimesylate (RHOPRESSA ) 0.02 % SOLN Place 1 drop into the right eye every evening. 03/10/24     RHOPRESSA  0.02 % SOLN Apply 1 drop to eye at bedtime. Use in the evening 10/02/23   Narendra, Nischal, MD  ROCKLATAN  0.02-0.005 % SOLN Place 1 drop into the right eye at bedtime. 10/02/23   Narendra, Nischal, MD  SIMBRINZA  1-0.2 % SUSP Place  1 drop into the right eye 2 (two) times daily. 10/02/23   Narendra, Nischal, MD  spironolactone  (ALDACTONE ) 25 MG tablet Take 0.5 tablets (12.5 mg total) by mouth daily. 10/15/23   Ruddy Corral M, PA-C  timolol  (TIMOPTIC ) 0.5 % ophthalmic solution Place 1 drop into the right eye 2 (two) times daily. Patient not taking: Reported on 02/19/2024 11/30/23   Ronni Colace, DO  timolol  (TIMOPTIC ) 0.5 % ophthalmic solution Place 1 drop into the right eye 2 (two) times daily. 03/10/24         Allergies    Patient has no known allergies.    Review of Systems   Review of Systems  Unable to perform ROS: Acuity of  condition    Physical Exam Updated Vital Signs BP (!) 151/100   Pulse 74   Resp 13   Wt 97 kg   SpO2 95%   BMI 31.58 kg/m  Physical Exam Vitals and nursing note reviewed.  Constitutional:      General: He is in acute distress (respirtory distress).     Appearance: He is diaphoretic.  HENT:     Head: Normocephalic and atraumatic.     Nose: Nose normal.     Mouth/Throat:     Mouth: Mucous membranes are moist.     Pharynx: Oropharynx is clear. Uvula midline. No oropharyngeal exudate or posterior oropharyngeal erythema.     Tonsils: No tonsillar exudate.  Eyes:     General: Lids are normal. Vision grossly intact.        Right eye: No discharge.        Left eye: No discharge.     Extraocular Movements: Extraocular movements intact.     Conjunctiva/sclera: Conjunctivae normal.     Pupils: Pupils are equal, round, and reactive to light.  Neck:     Trachea: Trachea and phonation normal.  Cardiovascular:     Rate and Rhythm: Normal rate and regular rhythm.     Pulses: Normal pulses.  Pulmonary:     Effort: Tachypnea and accessory muscle usage present. No respiratory distress.     Breath sounds: Examination of the right-middle field reveals rales. Examination of the left-middle field reveals rales. Examination of the right-lower field reveals rales. Examination of the left-lower field reveals rales. Rales present. No wheezing.     Comments: On BiPAP. Chest:     Chest wall: No mass, tenderness or edema.  Abdominal:     General: Bowel sounds are normal. There is no distension.     Palpations: Abdomen is soft.     Tenderness: There is no abdominal tenderness.  Musculoskeletal:        General: No deformity.     Cervical back: Normal range of motion and neck supple.     Right lower leg: No tenderness. 1+ Edema present.     Left lower leg: No tenderness. 1+ Edema present.  Lymphadenopathy:     Cervical: No cervical adenopathy.  Skin:    General: Skin is warm.     Capillary  Refill: Capillary refill takes less than 2 seconds.  Neurological:     General: No focal deficit present.     Mental Status: He is alert and oriented to person, place, and time. Mental status is at baseline.     GCS: GCS eye subscore is 4. GCS verbal subscore is 5. GCS motor subscore is 6.  Psychiatric:        Mood and Affect: Mood normal.     ED Results /  Procedures / Treatments   Labs (all labs ordered are listed, but only abnormal results are displayed) Labs Reviewed  COMPREHENSIVE METABOLIC PANEL WITH GFR - Abnormal; Notable for the following components:      Result Value   Glucose, Bld 160 (*)    Calcium  8.7 (*)    Albumin 3.4 (*)    All other components within normal limits  CBC - Abnormal; Notable for the following components:   RBC 4.18 (*)    HCT 38.4 (*)    All other components within normal limits  BRAIN NATRIURETIC PEPTIDE - Abnormal; Notable for the following components:   B Natriuretic Peptide 180.4 (*)    All other components within normal limits  I-STAT CHEM 8, ED - Abnormal; Notable for the following components:   Glucose, Bld 157 (*)    Calcium , Ion 1.09 (*)    All other components within normal limits  I-STAT VENOUS BLOOD GAS, ED - Abnormal; Notable for the following components:   pCO2, Ven 43.5 (*)    pO2, Ven 130 (*)    Calcium , Ion 1.09 (*)    HCT 38.0 (*)    Hemoglobin 12.9 (*)    All other components within normal limits  TROPONIN I (HIGH SENSITIVITY) - Abnormal; Notable for the following components:   Troponin I (High Sensitivity) 30 (*)    All other components within normal limits  TROPONIN I (HIGH SENSITIVITY) - Abnormal; Notable for the following components:   Troponin I (High Sensitivity) 30 (*)    All other components within normal limits  RESP PANEL BY RT-PCR (RSV, FLU A&B, COVID)  [id number]    EKG EKG Interpretation Date/Time:  Friday March 21 2024 03:02:37 EDT Ventricular Rate:  76 PR Interval:  172 QRS Duration:  104 QT  Interval:  402 QTC Calculation: 452 R Axis:   6  Text Interpretation: Sinus rhythm Consider left atrial enlargement Abnrm T, consider ischemia, anterolateral lds Baseline wander in lead(s) V2 Confirmed by Townsend Freud [phone]) on 03/21/2024 6:10:09 AM  Radiology DG Chest Port 1 View Result Date: 03/21/2024 CLINICAL DATA:  Respiratory distress EXAM: PORTABLE CHEST 1 VIEW COMPARISON:  11/22/2023 FINDINGS: The heart size and mediastinal contours are within normal limits. Both lungs are clear. The visualized skeletal structures are unremarkable. IMPRESSION: No active disease. Electronically Signed   By: Juanetta Nordmann M.D.   On: 03/21/2024 03:38    Procedures .Critical Care  Performed by: Kae Oram, PA-C Authorized by: Kae Oram, PA-C   Critical care provider statement:    Critical care time (minutes):  45   Critical care was time spent personally by me on the following activities:  Development of treatment plan with patient or surrogate, discussions with consultants, evaluation of patient's response to treatment, examination of patient, obtaining history from patient or surrogate, ordering and performing treatments and interventions, ordering and review of laboratory studies, ordering and review of radiographic studies, pulse oximetry and re-evaluation of patient's condition     Medications Ordered in ED Medications  furosemide  (LASIX ) injection 40 mg (40 mg Intravenous Given 03/21/24 0419)    ED Course/ Medical Decision Making/ A&P Clinical Course as of 03/21/24 0716  Fri Mar 21, 2024  0424 Patient having inconsistent respirations on BiPAP, respiratory bedside with multiple adjustments without improvement.  Trialed patient on CPAP with significant improvement in his respirations. [RS]  [phone] Patient removed from CPAP by this provider with maintenance of normal respiratory rate and O2 sat.  [RS]  9562 Known  diastolic HF, known homeless. Came in today for SOB and  cough. Had rales, put on CPAP and switched to BiPAP. Given 40mg  of lasix  and now on nasal cannula and currently off. Reports not back to baseline and still noting rales. EF 30 to 35%.  [CB]    Clinical Course User Index [CB] Bauer, Collin S, PA-C [RS] Natali Lavallee, Lennart Quitter                                 Medical Decision Making 63 year old male presents concern for respiratory distress.  Hypertensive intake, vitals otherwise normal.  On BiPAP.  Abdominal exam is benign.  1+ lower extremity edema nonpitting.  Patient sounds quite wet with rales throughout the lung fields bilaterally.  Demonstrating noes includes limited to pulmonary edema, pleural effusion, pneumonia, CHF, pneumothorax, COPD  Amount and/or Complexity of Data Reviewed Labs: ordered.    Details: CBC without leukocytosis or anemia.  VBG without acidosis.  BNP 180, troponins flat x 2, 30.  CMP Remarkable.   Radiology: ordered.    Details: Chest x-ray without acute cardiopulmonary disease  ECG/medicine tests:     Details: EKG with sinus rhythm  Risk Prescription drug management. Decision regarding hospitalization.   Patient was able to be weaned from BiPAP to nasal cannula on 2 L.  Ambulated with maintenance of his oxygen saturation, able to maintain O2 sat greater than 95% on room air after resting following ambulation, urinating significantly after Lasix . Patient would benefit from observation admission at minimum to continue diuresis and ensure resolution of rales and sensation of shortness of breath.    Devorah Fonder voiced understanding of his medical evaluation and treatment plan. Each of their questions answered to their expressed satisfaction.He is amenable to plan for admission at this time. Consult to internal medicine pending at time of shift change.   This chart was dictated using voice recognition software, Dragon. Despite the best efforts of this provider to proofread and correct errors, errors may  still occur which can change documentation meaning.        Final Clinical Impression(s) / ED Diagnoses Final diagnoses:  None    Rx / DC Orders ED Discharge Orders     None         Arlyne Lame 03/21/24 0716    Lindle Rhea, MD 03/21/24 [phone]

## 2024-03-21 NOTE — Plan of Care (Signed)
 ?  Problem: Elimination: ?Goal: Will not experience complications related to urinary retention ?Outcome: Progressing ?  ?

## 2024-03-21 NOTE — ED Provider Notes (Signed)
  Physical Exam  BP (!) 151/100   Pulse 74   Resp 13   Wt 97 kg   SpO2 95%   BMI 31.58 kg/m   Physical Exam Vitals and nursing note reviewed.  Constitutional:      General: He is not in acute distress. HENT:     Head: Normocephalic and atraumatic.  Eyes:     Extraocular Movements: Extraocular movements intact.  Cardiovascular:     Rate and Rhythm: Normal rate and regular rhythm.  Pulmonary:     Effort: Pulmonary effort is normal.     Breath sounds: Rales present.  Skin:    General: Skin is warm.     Coloration: Skin is not cyanotic.  Neurological:     Mental Status: He is alert.  Psychiatric:        Mood and Affect: Mood normal.     Procedures  Procedures  ED Course / MDM   Clinical Course as of 03/21/24 0811  Fri Mar 21, 2024  0424 Patient having inconsistent respirations on BiPAP, respiratory bedside with multiple adjustments without improvement.  Trialed patient on CPAP with significant improvement in his respirations. [RS]  [phone] Patient removed from CPAP by this provider with maintenance of normal respiratory rate and O2 sat.  [RS]  0704 Known diastolic HF, known homeless. Came in today for SOB and cough. Had rales, put on CPAP and switched to BiPAP. Given 40mg  of lasix  and switched onto nasal cannula and currently off on room air satting 92-95%. Reports not back to baseline and still noting rales. EF 30 to 35% in 10/2023. Seek to admit for diuresis and observation.  [CB]    Clinical Course User Index [CB] Shamyra Farias S, PA-C [RS] Sponseller, Adelle Agent, PA-C   Medical Decision Making Amount and/or Complexity of Data Reviewed Labs: ordered. Radiology: ordered.  Risk Prescription drug management. Decision regarding hospitalization.   Patient care handed off from [facility].   Past medical history of known homelessness, polysubstance abuse, diastolic heart failure, diabetes, HTN, HLD.  Patient was still noted to have oxygen saturation of 92  to 94% on room air.  Lungs still had noticeable rales bilaterally.  Will seek to admit as per reasons noted above.  Patient care transferred over to internal medicine.       Hayes Lipps, [place] 03/21/24 [id number]    Dorenda Gandy, MD 03/25/24 1311

## 2024-03-21 NOTE — Hospital Course (Addendum)
 Date: 03/21/2024               Patient Name:  Brady Church MRN: [id number]  DOB: [birth year] Age / Sex: 63 y.o., male   PCP: Lasandra Points, MD (Inactive)         Medical Service: Internal Medicine Teaching Service         Attending Physician: Dr. Tod Forward, DO      First Contact: Marni Sins, MD    Second Contact: Dr. Chrissie Coupe, MD          After Hours (After 5p/  First Contact Pager: [phone]  weekends / holidays): Second Contact Pager: [phone]   SUBJECTIVE   Chief Complaint: shortness of breath  History of Present Illness:  Brady Church is a 63 year old male with a past medical history of HFrEF (EF 30-35%), polysubstance use disorder (alcohol and cocaine use), type 2 diabetes, hyperlipidemia, hypertension, glaucoma and legal blindness who presented to the emergency via EMS from [facility] with acute shortness of breath and cough. Known homeless, polysubstance use abuser. Brought in by EMS on CPAP, switched to BIPAP in the ED, on room air, still notable rales. Given 40 of Lasix , diuresing    ED Course: Received nitroglycerin, Solu-Medrol, IV mag, DuoNebs and route by EMS Hypertensive on presentation Started on BiPAP with improvement in symptoms Chest x-ray with no active cardiopulmonary process CBC, BMP unremarkable Troponin 30 Given 40 mg IV Lasix  VBG with pH within normal limits EKG was sinus rhythm, no acute ischemic changes  Meds:  No outpatient medications have been marked as taking for the 03/21/24 encounter [facility] Encounter).   Past Medical History: Past Medical History:  Diagnosis Date   Alcohol use disorder, severe, dependence (HCC) 11/06/2014   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 Stopped in 2010    Tobacco use disorder 10/07/2014   Past Surgical History: Past Surgical History:  Procedure Laterality Date   CATARACT EXTRACTION  01/08/2012   Right eye   EYE SURGERY     x 4 total    Social:  Lives: Homeless[facility] Occupation: unemployed Level of Function: independent in ADLs PCP: Lasandra Points, MD (Inactive) Substances: Cocaine and alcohol use; no tobacco or other drug use  Family History:  Family History  Problem Relation Age of Onset   Coronary artery disease Mother 82       died of MI   Cancer Father 3       Some GI cancer. Not sure if it was Colon Cancer or not.   Hypertension Father    Diabetes Maternal Grandmother    Allergies: Allergies as of 03/21/2024   (No Known Allergies)   Review of Systems: A complete ROS was negative except as per HPI.   OBJECTIVE:   Physical Exam: Blood pressure (!) 160/116, pulse 70, resp. rate 11, weight 97 kg, SpO2 100%.  Constitutional: well-appearing *** sitting in ***, in no acute distress HENT: normocephalic atraumatic, mucous membranes moist Eyes: conjunctiva non-erythematous Neck: supple Cardiovascular: regular rate and rhythm, no m/r/g Pulmonary/Chest: normal work of breathing on room air, lungs clear to auscultation bilaterally Abdominal: soft, non-tender, non-distended MSK: normal bulk and tone Neurological: alert & oriented  x 3, 5/5 strength in bilateral upper and lower extremities, normal gait Skin: warm and dry Psych: ***  Labs: CBC    Component Value Date/Time   WBC 7.1 03/21/2024 0306   RBC 4.18 (L) 03/21/2024 0306   HGB 13.3 03/21/2024 0340   HGB 12.9 (L) 03/21/2024 0340   HGB 13.6 08/06/2019 1036   HCT 39.0 03/21/2024 0340   HCT 38.0 (L) 03/21/2024 0340   HCT 38.1 08/06/2019 1036   PLT 306 03/21/2024 0306   PLT 227 08/06/2019 1036   MCV 91.9 03/21/2024 0306   MCV 94 08/06/2019 1036   MCH 32.1 03/21/2024 0306   MCHC 34.9 03/21/2024 0306   RDW 14.1 03/21/2024 0306   RDW  14.1 08/06/2019 1036   LYMPHSABS 1.6 11/23/2023 0648   MONOABS 0.4 11/23/2023 0648   EOSABS 0.2 11/23/2023 0648   BASOSABS 0.0 11/23/2023 0648     CMP     Component Value Date/Time   NA 139 03/21/2024 0340   NA 139 03/21/2024 0340   NA 139 03/06/2024 1110   K 3.6 03/21/2024 0340   K 3.6 03/21/2024 0340   CL 105 03/21/2024 0340   CO2 23 03/21/2024 0306   GLUCOSE 157 (H) 03/21/2024 0340   BUN 16 03/21/2024 0340   BUN 20 03/06/2024 1110   CREATININE 1.00 03/21/2024 0340   CREATININE 0.93 01/07/2015 1039   CALCIUM  8.7 (L) 03/21/2024 0306   PROT 6.9 03/21/2024 0306   PROT 7.3 07/05/2022 1034   ALBUMIN 3.4 (L) 03/21/2024 0306   ALBUMIN 4.4 07/05/2022 1034   AST 40 03/21/2024 0306   ALT 32 03/21/2024 0306   ALKPHOS 57 03/21/2024 0306   BILITOT 0.9 03/21/2024 0306   BILITOT 0.3 07/05/2022 1034   GFRNONAA >60 03/21/2024 0306   GFRNONAA >89 01/07/2015 1039   GFRAA 102 02/11/2020 1023   GFRAA >89 01/07/2015 1039    Imaging: DG Chest Port 1 View CLINICAL DATA:  Respiratory distress  EXAM: PORTABLE CHEST 1 VIEW  COMPARISON:  11/22/2023  FINDINGS: The heart size and mediastinal contours are within normal limits. Both lungs are clear. The visualized skeletal structures are unremarkable.  IMPRESSION: No active disease.  Electronically Signed   By: Juanetta Nordmann M.D.   On: 03/21/2024 03:38  EKG: personally reviewed my interpretation is sinus rhythm, no new ischemic changes  ASSESSMENT & PLAN:  Assessment & Plan by Problem: Active Problems:   * No active hospital problems. *  Brady Church is a 63 y.o. person living with a history of HFrEF (EF 30-35%), polysubstance use disorder (alcohol and cocaine use), type 2 diabetes, hyperlipidemia, hypertension, glaucoma and legal blindness who presented with acute shortness of breath and is admitted for acute hypoxic respiratory failure on hospital day 0  Acute hypoxic respiratory failure HFrEF (EF 30-35%) Suspect heart  failure exacerbation in the setting of poor medication adherence. He was previously started on GDMT, but has not been taking it. Recent cocaine use may be contributory, as he was previously admitted for presumed cocaine-induced pulmonary edema. Given reported cough for 1 week, may also have infectious component, though he is afebrile with no leukocytosis, and CXR was unremarkable. Echocardiogram in December 2024 showed EF 30-35%, global hypokinesis of the LV. Weight is about the same as when he was admitted in December 2024. On BiPAP, satting well and appears comfortable. No acidosis, no hypercapnia.  - Will give another dose of IV Lasix  40 mg  - Restart spironolactone  12.5 mg daily; holding coreg  in the  setting of CHF exacerbation - Strict I's and O's - Trending BMP - Respiratory viral panel - Wean bipap as tolerated - Cardiac monitoring - UDS - Ambulation with pulse oximetry  Chronic conditions: Alcohol use disorder: Last drink was. CIWA protocol. Thiamine , multivitamin.  Hypertension: Hypertensive on presentation, 160/116. On losartan  25 mg daily. Per recent PCP visit, plan was to restart spironolactone , which we will do while he is hospitalized.  Type 2 diabetes: Glucose 160 on presentation. Recent A1c 7.4 two weeks ago. On metformin  500 mg daily, tapering to 2,000 mg daily. Held for now.  HLD: Recent lipid panel with LDL 125. On Atorvastatin  80 mg daily and aspirin  81 mg daily, continued.  Glaucoma: Continued brimonidine , fluorometholone , and netarsudil -latanoprost  eye drops. [facility] instability: [facility]. TOC consult for resources and help securing housing.  Diet: NPO on bipap VTE: {NAMES:3044014::"Heparin ","Enoxaparin ","SCDs","DOAC","None"} IVF: None Code: {NAMES:3044014::"Full","DNR","DNI","DNR/DNI","Comfort Care","Unknown"}  Dispo: Admit patient to {STATUS:3044014::"Observation with expected length of stay less than 2 midnights.","Inpatient with expected  length of stay greater than 2 midnights."}  Signed: Dorthy Gavia, MD Internal Medicine Resident, PGY-1 [facility]   03/21/2024, 5:59 AM

## 2024-03-22 ENCOUNTER — Other Ambulatory Visit (HOSPITAL_COMMUNITY): Payer: Self-pay

## 2024-03-22 DIAGNOSIS — I110 Hypertensive heart disease with heart failure: Secondary | ICD-10-CM | POA: Diagnosis not present

## 2024-03-22 DIAGNOSIS — I5043 Acute on chronic combined systolic (congestive) and diastolic (congestive) heart failure: Secondary | ICD-10-CM | POA: Diagnosis not present

## 2024-03-22 LAB — BASIC METABOLIC PANEL WITH GFR
Anion gap: 12 (ref 5–15)
BUN: 23 mg/dL (ref 8–23)
CO2: 25 mmol/L (ref 22–32)
Calcium: 8.5 mg/dL — ABNORMAL LOW (ref 8.9–10.3)
Chloride: 97 mmol/L — ABNORMAL LOW (ref 98–111)
Creatinine, Ser: 1.07 mg/dL (ref 0.61–1.24)
GFR, Estimated: >60 mL/min (ref >60)
Glucose, Bld: 273 mg/dL — ABNORMAL HIGH (ref 70–99)
Potassium: 3.4 mmol/L — ABNORMAL LOW (ref 3.5–5.1)
Sodium: 134 mmol/L — ABNORMAL LOW (ref 135–145)

## 2024-03-22 LAB — CBC
HCT: 37.8 % — ABNORMAL LOW (ref 39.0–52.0)
Hemoglobin: 13.6 g/dL (ref 13.0–17.0)
MCH: 32.5 pg (ref 26.0–34.0)
MCHC: 36 g/dL (ref 30.0–36.0)
MCV: 90.4 fL (ref 80.0–100.0)
Platelets: 290 10*3/uL (ref 150–400)
RBC: 4.18 MIL/uL — ABNORMAL LOW (ref 4.22–5.81)
RDW: 13.9 % (ref 11.5–15.5)
WBC: 10.2 10*3/uL (ref 4.0–10.5)
nRBC: 0 % (ref 0.0–0.2)

## 2024-03-22 MED ORDER — ATORVASTATIN CALCIUM 80 MG PO TABS
80.0000 mg | ORAL_TABLET | Freq: Every day | ORAL | 0 refills | Status: DC
  Filled 2024-03-22: qty 30, 30d supply, fill #0

## 2024-03-22 MED ORDER — SPIRONOLACTONE 25 MG PO TABS
12.5000 mg | ORAL_TABLET | Freq: Every day | ORAL | 0 refills | Status: DC
  Filled 2024-03-22: qty 30, 60d supply, fill #0

## 2024-03-22 MED ORDER — METFORMIN HCL 500 MG PO TABS
ORAL_TABLET | ORAL | 0 refills | Status: DC
Start: 2024-03-22 — End: 2024-04-24
  Filled 2024-03-22: qty 78, 30d supply, fill #0

## 2024-03-22 MED ORDER — LOSARTAN POTASSIUM 25 MG PO TABS
25.0000 mg | ORAL_TABLET | Freq: Every day | ORAL | 0 refills | Status: DC
  Filled 2024-03-22: qty 30, 30d supply, fill #0

## 2024-03-22 NOTE — Discharge Summary (Signed)
 Name: Brady Church MRN: [id number] DOB: [birth year] 63 y.o. PCP: Lasandra Points, MD (Inactive)  Date of Admission: 03/21/2024  2:57 AM Date of Discharge: 03/22/2024 Attending Physician: Cherylene Corrente, MD  Discharge Diagnosis: 1. Principal Problem:   Acute exacerbation of CHF (congestive heart failure) (HCC)    Discharge Medications: Allergies as of 03/22/2024   No Known Allergies      Medication List     STOP taking these medications    carvedilol  3.125 MG tablet Commonly known as: COREG    furosemide  40 MG tablet Commonly known as: LASIX    spironolactone  25 MG tablet Commonly known as: ALDACTONE        TAKE these medications    atorvastatin  80 MG tablet Commonly known as: LIPITOR  Take 1 tablet (80 mg total) by mouth daily at 6 PM.   folic acid  1 MG tablet Commonly known as: FOLVITE  Take 1 tablet (1 mg total) by mouth daily.   losartan  25 MG tablet Commonly known as: COZAAR  Take 1 tablet (25 mg total) by mouth daily.   Lumigan  0.01 % Soln Generic drug: bimatoprost  Place 1 drop into the right eye every evening. What changed:  how to take this when to take this   metFORMIN  500 MG tablet Commonly known as: GLUCOPHAGE  Take 1 Tablet (500mg ) every morning for one week Take 1 tablet (500 mg) every morning and night for one week Take 2 tablets (1,000 mg) every morning and 1 tablet (500 mg) every night for one week Take 2 tablets (1,000 mg) every morning and every night   Miconazole  Nitrate 2 % Aerp Apply topically daily to feet daily before putting socks on   Rhopressa  0.02 % Soln Generic drug: Netarsudil  Dimesylate Place 1 drop into the right eye every evening.   Simbrinza  1-0.2 % Susp Generic drug: Brinzolamide -Brimonidine  Place 1 drop into the right eye 2 (two) times daily.   Simbrinza  1-0.2 % Susp Generic drug: Brinzolamide -Brimonidine  Place 1 drop into the right eye 2 (two) times daily.   timolol  0.5 % ophthalmic solution Commonly  known as: TIMOPTIC  Place 1 drop into the right eye 2 (two) times daily.   timolol  0.5 % ophthalmic solution Commonly known as: TIMOPTIC  Place 1 drop into the right eye 2 (two) times daily.        Disposition and follow-up:   Brady Church was discharged from [facility] in Good condition.  At the hospital follow up visit please address:    HF Exacerbation - Likely in the setting of not having access to his medication and crack cocaine use. Discharging him with losartan  25mg , and spironolactone  12.5mg . Please consider up-titrating as well as assessing volume status for initiation of lasix  daily.   2.  Labs / imaging needed at time of follow-up: BMP  3.  Pending labs/ test needing follow-up: NA  Follow-up Appointments: Pending with [facility]  Hospital Course by problem list:  #Acute Hypoxic Respiratory Failure  #HFrEF Exacerbation  Pt presented via EMS for shortness of breath, initially on BiPAP but was weaned off fairly quickly. He has also been using cocaine intermittently, and was not able to pick up his medications unfortunately as he wasn't able to afford them. He was given two doses of IV Lasix  40mg  and had significant improvement in breathing as well as volume status. Restarted his losartan  and spironolactone  12.5mg  with [facility]. Please consider uptitrating at follow up visit.   Hypertension: Hypertensive on presentation, 160/116, blood pressure has since normalized.  Type  2 diabetes: Glucose 160 on presentation. Recent A1c 7.4 two weeks ago. On metformin  500 mg daily, tapering to 2,000 mg daily. Will restart on discharge HLD: Recent lipid panel with LDL 125. On Atorvastatin  80 mg daily and aspirin  81 mg daily, continued.  Glaucoma: Continued brimonidine , fluorometholone , and netarsudil -latanoprost  eye drops. Housing instability: [facility]. TOC consult for resources and help securing housing.   Discharge Exam:    BP (!) 138/96 (BP Location: Left Arm)   Pulse 75   Temp 97.7 F (36.5 C) (Oral)   Resp 20   Ht 5\' 9"  (1.753 m)   Wt 93.1 kg   SpO2 96%   BMI 30.31 kg/m  Discharge exam:   Constitutional: Well-appearing male, sitting in bed, in no acute distress  HENT: Normocephalic, atraumatic Neck: Supple, no JVD  Cardiovascular: Regular rate and rhythm, no murmurs, rubs, or gallops  Pulmonary: Lungs clear to auscultation bilaterally  Abdominal: Soft, non-tender, non-distended  Lower Extremities: No edema present    Pertinent Labs, Studies, and Procedures:     Latest Ref Rng & Units 03/22/2024    3:12 AM 03/21/2024    3:40 AM 03/21/2024    3:06 AM  CBC  WBC 4.0 - 10.5 K/uL 10.2   7.1   Hemoglobin 13.0 - 17.0 g/dL 09.8  11.9    14.7  82.9   Hematocrit 39.0 - 52.0 % 37.8  39.0    38.0  38.4   Platelets 150 - 400 K/uL 290   306         Latest Ref Rng & Units 03/22/2024    3:12 AM 03/21/2024    3:40 AM 03/21/2024    3:06 AM  BMP  Glucose 70 - 99 mg/dL 562  130  865   BUN 8 - 23 mg/dL 23  16  15    Creatinine 0.61 - 1.24 mg/dL 7.84  6.96  2.95   Sodium 135 - 145 mmol/L 134  139    139  138   Potassium 3.5 - 5.1 mmol/L 3.4  3.6    3.6  3.6   Chloride 98 - 111 mmol/L 97  105  104   CO2 22 - 32 mmol/L 25   23   Calcium  8.9 - 10.3 mg/dL 8.5   8.7      Discharge Instructions: Discharge Instructions     (HEART FAILURE PATIENTS) Call MD:  Anytime you have any of the following symptoms: 1) 3 pound weight gain in 24 hours or 5 pounds in 1 week 2) shortness of breath, with or without a dry hacking cough 3) swelling in the hands, feet or stomach 4) if you have to sleep on extra pillows at night in order to breathe.   Complete by: As directed    Call MD for:  difficulty breathing, headache or visual disturbances   Complete by: As directed    Call MD for:  persistant nausea and vomiting   Complete by: As directed    Diet - low sodium heart healthy   Complete by: As directed    Increase  activity slowly   Complete by: As directed        Signed: Chrissie Coupe, MD 03/22/2024, 8:08 AM   Pager: [phone]

## 2024-03-22 NOTE — Discharge Instructions (Addendum)
 Brady Church,   It was a pleasure taking care of you in the hospital. You were admitted for a heart failure exacerbation. We will give you your medications at bedside before you leave. I will also reach out to the front desk and schedule you a follow up appointment in our clinic.   Best, Internal Medicine Team

## 2024-03-24 ENCOUNTER — Other Ambulatory Visit (HOSPITAL_COMMUNITY): Payer: Self-pay

## 2024-03-24 LAB — GLUCOSE, POCT (MANUAL RESULT ENTRY): POC Glucose: 195 mg/dL — AB (ref 70–99)

## 2024-03-24 NOTE — Congregational Nurse Program (Signed)
 Client to RN office for housing assistance, he is interested in pallet housing, RN completed referral for program, additionally called several agencies for apartment housing for client, without success. Additionally, client was recently released from the hospital on Wednesday- for fluid overload, CHF, he had run out of medication and has no further questions on stay. He is taking all medicine and we will look into re-establishing care with PCP on our next scheduled meeting.  RN checked vitals: 151/98 80 [id number]. Client stated he just taken his medicine right before appointment therefore no further action taken at this time. RN will follow up with this client as needed.

## 2024-03-27 NOTE — Addendum Note (Signed)
 Addended by: Aurora Lees on: 03/27/2024 03:13 PM   Modules accepted: Orders

## 2024-04-07 ENCOUNTER — Ambulatory Visit: Payer: MEDICAID | Admitting: Podiatry

## 2024-04-07 LAB — GLUCOSE, POCT (MANUAL RESULT ENTRY): POC Glucose: 261 mg/dL — AB (ref 70–99)

## 2024-04-07 NOTE — Congregational Nurse Program (Signed)
 Brady Church to RN office, he is requesting BP check and CBG check. BP 117/78 and CBG is 261. He is taking his Metformin  today. He says he has a doctors appointment today at 10:15 a. RN arranged for an [employer] and called office to see if he could still make appointment. They will work with him possibly. 1 bus pass given for return trip. RN will continue to follow this client.

## 2024-04-08 ENCOUNTER — Ambulatory Visit: Payer: MEDICAID

## 2024-04-08 LAB — GLUCOSE, POCT (MANUAL RESULT ENTRY): POC Glucose: 305 mg/dL — AB (ref 70–99)

## 2024-04-08 NOTE — Progress Notes (Signed)
 Returning pt. Pt has not taken meds this am. Pt has had juice, states he will take metformin  with breakfast.

## 2024-04-10 ENCOUNTER — Other Ambulatory Visit: Payer: Self-pay

## 2024-04-10 LAB — GLUCOSE, POCT (MANUAL RESULT ENTRY): POC Glucose: 218 mg/dL — AB (ref 70–99)

## 2024-04-10 NOTE — Congregational Nurse Program (Signed)
 Client to RN office. Client state he needs help with getting food benefits from [facility]. He is requesting bus passes to go to [facility]. [facility] also [facility] but says he needs assistance with affording them. RN gave bus passes to get to [place] and [facility]. He was also made aware that he can get medicine billed to an account and get medicine today. BP and CBG checked and elevated. He will go to pharmacy now and pick up meds and follow up with RN later today for recheck of BP and CBG. RN will continue to follow and support this client.

## 2024-04-14 ENCOUNTER — Other Ambulatory Visit: Payer: Self-pay | Admitting: Internal Medicine

## 2024-04-14 ENCOUNTER — Other Ambulatory Visit: Payer: Self-pay

## 2024-04-14 ENCOUNTER — Ambulatory Visit: Payer: MEDICAID | Admitting: Student

## 2024-04-14 DIAGNOSIS — I10 Essential (primary) hypertension: Secondary | ICD-10-CM

## 2024-04-14 MED ORDER — LOSARTAN POTASSIUM 25 MG PO TABS
25.0000 mg | ORAL_TABLET | Freq: Every day | ORAL | 0 refills | Status: DC
  Filled 2024-04-14: qty 30, 30d supply, fill #0

## 2024-04-17 ENCOUNTER — Ambulatory Visit: Payer: Self-pay | Admitting: Student

## 2024-04-17 LAB — GLUCOSE, POCT (MANUAL RESULT ENTRY): POC Glucose: 289 mg/dL — AB (ref 70–99)

## 2024-04-17 NOTE — Congregational Nurse Program (Signed)
 Client to RN office for BP Check and CBG. BP is 113/70 and pulse 83. CBG is 289, he states he just ate and is about to make medicine when he leaves RN office. RN provided patient education on adherence to medication and side effects of medication noncompliance with diabetes mellitus. He understands and agrees. He is thankful for the conversation. He has no further concerns at this times.

## 2024-04-22 ENCOUNTER — Ambulatory Visit: Payer: MEDICAID | Admitting: Family Medicine

## 2024-04-22 VITALS — BP 150/100 | HR 84 | Temp 98.3°F | Wt 210.0 lb

## 2024-04-22 DIAGNOSIS — I10 Essential (primary) hypertension: Secondary | ICD-10-CM

## 2024-04-22 DIAGNOSIS — R6889 Other general symptoms and signs: Secondary | ICD-10-CM

## 2024-04-22 LAB — GLUCOSE, POCT (MANUAL RESULT ENTRY): POC Glucose: 250 mg/dL — AB (ref 70–99)

## 2024-04-22 NOTE — Progress Notes (Signed)
 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  Chief Complaint: VS Check Living Situation: Un-housed Insurance Status:  [facility] Account   Not on file      Patient Status:  [x]  Returning patient to [facility]  Confirmed demographics and emergency contact info  HIPAA and privacy policies previously reviewed  Consent for digital charting: []  Previously Signed [x]  Signed Today []  Not Signed  Additional Notes:  Vital signs taken and entered in flowsheet  Interpreter services: []  Needed [x]  Not Needed  Brief SDOH update completed  Referral to provider: []  Needed [x]  Not Needed  RN Interventions Provided Today:  [x]  Health education (e.g., chronic disease, hygiene, nutrition)

## 2024-04-24 ENCOUNTER — Other Ambulatory Visit: Payer: Self-pay

## 2024-04-24 ENCOUNTER — Other Ambulatory Visit (HOSPITAL_COMMUNITY): Payer: Self-pay

## 2024-04-24 ENCOUNTER — Ambulatory Visit (INDEPENDENT_AMBULATORY_CARE_PROVIDER_SITE_OTHER): Payer: MEDICAID | Admitting: Student

## 2024-04-24 VITALS — BP 123/81 | HR 87 | Temp 98.6°F | Ht 69.0 in | Wt 212.4 lb

## 2024-04-24 DIAGNOSIS — E1165 Type 2 diabetes mellitus with hyperglycemia: Secondary | ICD-10-CM

## 2024-04-24 DIAGNOSIS — I63512 Cerebral infarction due to unspecified occlusion or stenosis of left middle cerebral artery: Secondary | ICD-10-CM | POA: Diagnosis not present

## 2024-04-24 DIAGNOSIS — I5042 Chronic combined systolic (congestive) and diastolic (congestive) heart failure: Secondary | ICD-10-CM | POA: Diagnosis not present

## 2024-04-24 DIAGNOSIS — E785 Hyperlipidemia, unspecified: Secondary | ICD-10-CM | POA: Diagnosis not present

## 2024-04-24 DIAGNOSIS — I110 Hypertensive heart disease with heart failure: Secondary | ICD-10-CM

## 2024-04-24 DIAGNOSIS — E1136 Type 2 diabetes mellitus with diabetic cataract: Secondary | ICD-10-CM

## 2024-04-24 DIAGNOSIS — I10 Essential (primary) hypertension: Secondary | ICD-10-CM

## 2024-04-24 DIAGNOSIS — Z7984 Long term (current) use of oral hypoglycemic drugs: Secondary | ICD-10-CM

## 2024-04-24 MED ORDER — METFORMIN HCL 500 MG PO TABS
ORAL_TABLET | ORAL | 0 refills | Status: DC
  Filled 2024-04-24: qty 78, fill #0

## 2024-04-24 MED ORDER — LOSARTAN POTASSIUM 25 MG PO TABS
25.0000 mg | ORAL_TABLET | Freq: Every evening | ORAL | 0 refills | Status: DC
  Filled 2024-04-24 (×2): qty 30, 30d supply, fill #0

## 2024-04-24 MED ORDER — METFORMIN HCL 500 MG PO TABS
ORAL_TABLET | ORAL | 0 refills | Status: DC
  Filled 2024-04-24: qty 78, 30d supply, fill #0
  Filled 2024-05-08 (×2): qty 78, 19d supply, fill #0

## 2024-04-24 MED ORDER — LOSARTAN POTASSIUM 25 MG PO TABS
25.0000 mg | ORAL_TABLET | Freq: Every evening | ORAL | 0 refills | Status: DC
  Filled 2024-04-24: qty 30, 30d supply, fill #0

## 2024-04-24 MED ORDER — ATORVASTATIN CALCIUM 80 MG PO TABS
80.0000 mg | ORAL_TABLET | Freq: Every day | ORAL | 0 refills | Status: DC
  Filled 2024-04-24: qty 30, 30d supply, fill #0

## 2024-04-24 MED ORDER — ASPIRIN 81 MG PO TBEC
81.0000 mg | DELAYED_RELEASE_TABLET | Freq: Every day | ORAL | 6 refills | Status: DC
Start: 2024-04-24 — End: 2024-04-24
  Filled 2024-04-24: qty 30, 30d supply, fill #0

## 2024-04-24 MED ORDER — ASPIRIN 81 MG PO TBEC
81.0000 mg | DELAYED_RELEASE_TABLET | Freq: Every day | ORAL | 6 refills | Status: AC
  Filled 2024-04-24: qty 30, 30d supply, fill #0
  Filled 2024-07-31: qty 30, 30d supply, fill #1

## 2024-04-24 MED ORDER — SPIRONOLACTONE 25 MG PO TABS
12.5000 mg | ORAL_TABLET | Freq: Every day | ORAL | 0 refills | Status: DC
Start: 2024-04-24 — End: 2024-04-24
  Filled 2024-04-24: qty 30, 60d supply, fill #0

## 2024-04-24 MED ORDER — SPIRONOLACTONE 25 MG PO TABS
12.5000 mg | ORAL_TABLET | Freq: Every day | ORAL | 0 refills | Status: DC
Start: 2024-04-24 — End: 2024-06-04
  Filled 2024-04-24: qty 30, 60d supply, fill #0

## 2024-04-24 NOTE — Assessment & Plan Note (Signed)
 Noted to have had a CVA in 2018.  He has been prescribed aspirin  with Lipitor  in the past but unclear if he is taking the aspirin  as this is not currently listed as a medication.  Will resume these appropriate medications. - Resume aspirin  81 mg daily - Resume Lipitor  80 mg daily

## 2024-04-24 NOTE — Progress Notes (Signed)
 Internal Medicine Clinic Attending  Case discussed with the resident at the time of the visit.  We reviewed the resident's history and exam and pertinent patient test results.  I agree with the assessment, diagnosis, and plan of care documented in the resident's note.

## 2024-04-24 NOTE — Addendum Note (Signed)
 Addended by: Maxie Spaniel on: 04/24/2024 10:31 AM   Modules accepted: Orders

## 2024-04-24 NOTE — Patient Instructions (Signed)
 Thank you so much for coming to the clinic today!   Please start taking the following medications:  - Lipitor  80 mg/day - Losartan  25 mg/day at night - Spironolactone  12.5 mg/day  - Aspirin  81 mg/day - Metformin  500 mg/day  We will see you in one month.   I have also sent in a referral to the heart failure clinic.  If you have any questions please feel free to the call the clinic at anytime at [phone]. It was a pleasure seeing you!  Best, Dr. Carolee Churchman

## 2024-04-24 NOTE — Assessment & Plan Note (Signed)
 Current medications include losartan  25 mg daily and spironolactone  25 mg/day.  Blood pressure today is 123/81.  Denies any signs or symptoms.  He has not taken any medications since discharge at the end of last month.  Will resume losartan  25 mg/day and spironolactone  12.5 mg/day.  May consider adding Coreg  at next visit depending on his vitals.  - Continue losartan  25 mg daily - Continue spironolactone  12.5 mg daily - Follow-up BMP

## 2024-04-24 NOTE — Assessment & Plan Note (Signed)
 Patient last seen by Dr. Sharlon Deacon on 03/06/2024.  At that time, LDL elevated at 125.  Of note, patient has a history of CVA.  He has not taken his medications since discharge at the end of last month so we will send these in today for refill.  Will follow-up in 1 month for reevaluation. - Resume Lipitor  80 mg/day - Follow-up lipid panel at next visit in 3 months

## 2024-04-24 NOTE — Assessment & Plan Note (Signed)
 Last echo in 10/2023 notable for left ventricular EF of 30 to 35%.  Patient has a history of noncompliance with his GDMT.  Currently takes losartan  and spironolactone .  He has also been prescribed Coreg  and Lasix  in the past. Weight today is 210 lbs. His weight at his visit on 03/06/2024 was similar at 216 lb. No signs of volume overload on physical examination.  Will resume Spiro 25 mg/day and losartan  25 mg/day.  Will also send in a referral to the heart failure clinic. - Referral to heart failure clinic - Resume Spiro 12.5 mg/day - Resume losartan  25 mg/day

## 2024-04-24 NOTE — Assessment & Plan Note (Signed)
 Last A1c 1 month ago was 7.4.  Current medications include metformin  500 mg/day.  He has not taken this medication for the past month.  Will resume today.  The patient tolerates current dosage, will titrate appropriately.  Given numerous social determinants of health for the patient, we will hold off on typical titration until next visit. - Resume metformin  500 mg/day

## 2024-04-24 NOTE — Progress Notes (Signed)
 CC: Heart failure/hypertension/history of CVA  HPI:  Mr.Brady Church is a 63 y.o. male living with a history stated below and presents today for hypertension, heart failure, and history of CVA. Please see problem based assessment and plan for additional details.  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 Stopped in 2010   Tobacco use disorder 10/07/2014    Current Outpatient Medications on File Prior to Visit  Medication Sig Dispense Refill   bimatoprost  (LUMIGAN ) 0.01 % SOLN Place 1 drop into the right eye every evening. (Patient taking differently: Place 1 drop into both eyes in the morning and at bedtime.) 7.5 mL 3   Brinzolamide -Brimonidine  (SIMBRINZA ) 1-0.2 % SUSP Place 1 drop into the right eye 2 (two) times daily. (Patient not taking: Reported on 03/21/2024) 8 mL 3   folic acid  (FOLVITE ) 1 MG tablet Take 1 tablet (1 mg total) by mouth daily. (Patient not taking: Reported on 02/19/2024) 30 tablet 3   Miconazole  Nitrate 2 % AERP Apply topically daily to feet daily before putting socks on (Patient not taking: Reported on 02/19/2024) 60 g 5   Netarsudil  Dimesylate (RHOPRESSA ) 0.02 % SOLN Place 1 drop into the right eye every evening. (Patient not taking: Reported on 03/21/2024) 7.5 mL 3   SIMBRINZA  1-0.2 % SUSP Place 1 drop into the right eye 2 (two) times daily. 8 mL 3   timolol  (TIMOPTIC ) 0.5 % ophthalmic solution Place 1 drop into the right eye 2 (two) times daily. (Patient not taking: Reported on 03/21/2024) 10 mL 2   timolol  (TIMOPTIC ) 0.5 % ophthalmic solution Place 1 drop into the right eye 2 (two) times daily. 15 mL 3   No current  facility-administered medications on file prior to visit.    Family History  Problem Relation Age of Onset   Coronary artery disease Mother 56       died of MI   Cancer Father 20       Some GI cancer. Not sure if it was Colon Cancer or not.   Hypertension Father    Diabetes Maternal Grandmother     Social History   Socioeconomic History   Marital status: Single    Spouse name: Not on file   Number of children: Not on file   Years of education: 12   Highest education level: Not on file  Occupational History   Occupation: Not working  Tobacco Use   Smoking status: Former    Current packs/day: 0.00    Types: Cigarettes    Quit date: 02/18/2017    Years since quitting: 7.1   Smokeless tobacco: Never  Vaping Use   Vaping status: Never Used  Substance and Sexual Activity   Alcohol use: Yes    Comment: Beer- 40 oz. every day.   Drug use: Not Currently    Types: Cocaine, Marijuana   Sexual activity: Not on file  Other Topics Concern   Not on file  Social History Narrative   Lives in [place] for last 10 years with "friend"  Brady Church      Is not married and does not have  kids.   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   Social Drivers of Corporate investment banker Strain: High Risk (10/02/2023)   Overall Financial Resource Strain (CARDIA)    Difficulty of Paying Living Expenses: Very hard  Food Insecurity: Food Insecurity Present (03/21/2024)   Hunger Vital Sign    Worried About Running Out of Food in the Last Year: Often true    Ran Out of Food in the Last Year: Often true  Transportation Needs: Unmet Transportation Needs (03/21/2024)   PRAPARE - Administrator, Civil Service (Medical): Yes    Lack of Transportation (Non-Medical): Yes  Physical Activity: Not on file  Stress: Not on file  Social Connections: Unknown (03/21/2024)   Social Connection and Isolation Panel [NHANES]     Frequency of Communication with Friends and Family: Never    Frequency of Social Gatherings with Friends and Family: Never    Attends Religious Services: Never    Database administrator or Organizations: No    Attends Banker Meetings: Never    Marital Status: Not on file  Intimate Partner Violence: Not At Risk (03/21/2024)   Humiliation, Afraid, Rape, and Kick questionnaire    Fear of Current or Ex-Partner: No    Emotionally Abused: No    Physically Abused: No    Sexually Abused: No    Review of Systems: ROS negative except for what is noted on the assessment and plan.  Vitals:   04/24/24 0947  BP: 123/81  Pulse: 87  Temp: 98.6 F (37 C)  TempSrc: Oral  SpO2: 93%  Weight: 212 lb 6.4 oz (96.3 kg)  Height: 5\' 9"  (1.753 m)    Physical Exam: Constitutional: well-appearing in no acute distress HENT: normocephalic atraumatic, mucous membranes moist Eyes: conjunctiva non-erythematous Neck: supple Cardiovascular: regular rate and rhythm, no m/r/g Pulmonary/Chest: normal work of breathing on room air, lungs clear to auscultation bilaterally Abdominal: soft, non-tender, non-distended MSK: normal bulk and tone Neurological: alert & oriented x 3, 5/5 strength in bilateral upper and lower extremities, normal gait Skin: warm and dry  Assessment & Plan:   Hyperlipidemia Patient last seen by Dr. Sharlon Deacon on 03/06/2024.  At that time, LDL elevated at 125.  Of note, patient has a history of CVA.  He has not taken his medications since discharge at the end of last month so we will send these in today for refill.  Will follow-up in 1 month for reevaluation. - Resume Lipitor  80 mg/day - Follow-up lipid panel at next visit in 3 months  Hypertension Current medications include losartan  25 mg daily and spironolactone  25 mg/day.  Blood pressure today is 123/81.  Denies any signs or symptoms.  He has not taken any medications since discharge at the end of last month.  Will resume  losartan  25 mg/day and spironolactone  12.5 mg/day.  May consider adding Coreg  at next visit depending on his vitals.  - Continue losartan  25 mg daily - Continue spironolactone  12.5 mg daily - Follow-up BMP  Poorly controlled type 2 diabetes mellitus (HCC) Last A1c 1 month ago was 7.4.  Current medications include metformin  500 mg/day.  He has not taken this medication for the past month.  Will resume today.  The patient tolerates current dosage, will titrate appropriately.  Given numerous social determinants of health for the patient, we will hold off on typical titration until next visit. - Resume  metformin  500 mg/day  CVA (cerebral vascular accident) (HCC) Noted to have had a CVA in 2018.  He has been prescribed aspirin  with Lipitor  in the past but unclear if he is taking the aspirin  as this is not currently listed as a medication.  Will resume these appropriate medications. - Resume aspirin  81 mg daily - Resume Lipitor  80 mg daily  Chronic combined systolic and diastolic congestive heart failure (HCC) Last echo in 10/2023 notable for left ventricular EF of 30 to 35%.  Patient has a history of noncompliance with his GDMT.  Currently takes losartan  and spironolactone .  He has also been prescribed Coreg  and Lasix  in the past. Weight today is 210 lbs. His weight at his visit on 03/06/2024 was similar at 216 lb. No signs of volume overload on physical examination.  Will resume Spiro 25 mg/day and losartan  25 mg/day.  Will also send in a referral to the heart failure clinic. - Referral to heart failure clinic - Resume Spiro 12.5 mg/day - Resume losartan  25 mg/day  Patient discussed with Dr. Brigitte Canard, MD  [facility] Internal Medicine, PGY-1 Date 04/24/2024 Time 10:23 AM

## 2024-04-25 ENCOUNTER — Ambulatory Visit: Payer: Self-pay | Admitting: Student

## 2024-04-25 LAB — BMP8+ANION GAP
Anion Gap: 18 mmol/L (ref 10.0–18.0)
BUN/Creatinine Ratio: 17 (ref 10–24)
BUN: 20 mg/dL (ref 8–27)
CO2: 21 mmol/L (ref 20–29)
Calcium: 9.2 mg/dL (ref 8.6–10.2)
Chloride: 101 mmol/L (ref 96–106)
Creatinine, Ser: 1.17 mg/dL (ref 0.76–1.27)
Glucose: 248 mg/dL — ABNORMAL HIGH (ref 70–99)
Potassium: 3.9 mmol/L (ref 3.5–5.2)
Sodium: 140 mmol/L (ref 134–144)
eGFR: 71 mL/min/{1.73_m2} (ref >59)

## 2024-04-25 NOTE — Progress Notes (Signed)
 Patient has no phone number on record.  Unfortunately, he also is homeless and has no address.  His labs are normal and we will continue to monitor these.

## 2024-04-29 ENCOUNTER — Telehealth: Payer: Self-pay | Admitting: *Deleted

## 2024-04-29 NOTE — Telephone Encounter (Signed)
 Called patient left message for patient to return call regarding his PCS request. Patient was to call back when he got appointment, never heard back from the patient.

## 2024-05-05 LAB — AMB RESULTS CONSOLE CBG: Glucose: 264

## 2024-05-05 NOTE — Progress Notes (Signed)
 Patient came to mobile screening at [facility]. Pt stated he had just ate some candy a few minutes before testing. Pt stated he does take blood pressure medication but has not taken it today. 117/75 BP, non fasting glucose 264. Last A1C 7.4 on 03/06/24.We discussed patients glucose level and referred to mobile medicine unit. Pt declined. Stated he was fine on all needed medications with no refills request. Shared healthy lifestyle options. Pt declined SDOH.

## 2024-05-06 ENCOUNTER — Ambulatory Visit (INDEPENDENT_AMBULATORY_CARE_PROVIDER_SITE_OTHER): Payer: MEDICAID | Admitting: Podiatry

## 2024-05-06 ENCOUNTER — Encounter: Payer: Self-pay | Admitting: Podiatry

## 2024-05-06 DIAGNOSIS — M79674 Pain in right toe(s): Secondary | ICD-10-CM | POA: Diagnosis not present

## 2024-05-06 DIAGNOSIS — M79675 Pain in left toe(s): Secondary | ICD-10-CM

## 2024-05-06 DIAGNOSIS — B351 Tinea unguium: Secondary | ICD-10-CM

## 2024-05-06 NOTE — Progress Notes (Signed)
       Subjective:  Patient ID: Brady Church, male    DOB: [birth year],  MRN: [id number]  [name] presents to clinic today for:  Chief Complaint  Patient presents with   [facility]    Rm5/ [facility] diabaetic/blood sugar 250   Patient notes nails are thick, discolored, elongated and painful in shoegear when trying to ambulate.  He also notes calluses on the great toes.  His A1c is much improved at 7.4 currently compared to over 11 the last time it had been taken.  PCP is Lasandra Points, MD.  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 Stopped in 2010   Tobacco use disorder 10/07/2014    Past Surgical History:  Procedure Laterality Date   CATARACT EXTRACTION  01/08/2012   Right eye   EYE SURGERY     x 4 total     No Known Allergies  Review of Systems: Negative except as noted in the HPI.  Objective:  Brady Church is a pleasant 63 y.o. male in NAD. AAO x 3.  Vascular Examination: Capillary refill time is 3-5 seconds to toes bilateral. Palpable pedal pulses b/l LE. Digital hair absent b/l.  Skin temperature gradient WNL b/l. No varicosities b/l. No cyanosis noted b/l.  Mild pitting edema bilateral ankles  Dermatological Examination: Pedal skin with normal turgor, texture and tone b/l. No open wounds. No interdigital macerations b/l. Toenails x10 are 4 mm thick, discolored, dystrophic with subungual debris. There is pain with compression of the nail plates.  They are elongated x10.  There are hyperkeratotic lesions on the plantar medial aspect the hallux IPJ bilateral.       Latest Ref Rng & Units 03/06/2024    9:42 AM 10/16/2023   11:31 AM  08/29/2023   10:10 AM  Hemoglobin A1C  Hemoglobin-A1c 4.0 - 5.6 % 7.4  11.0  C 7.8     C Corrected result   Assessment/Plan: 1. Pain due to onychomycosis of toenails of both feet    The mycotic toenails were sharply debrided x10 with sterile nail nippers and a power debriding burr to decrease bulk/thickness and length.    The hyperkeratotic lesions were shaved with a sterile #[id number] blade at the bilateral hallux IPJ as a courtesy.  Return in about 3 months (around 08/06/2024) for [facility].   Joe Murders, DPM, FACFAS [facility]     [address] [place], [place] [address]                Office [phone]  Fax [phone]

## 2024-05-08 ENCOUNTER — Other Ambulatory Visit: Payer: Self-pay

## 2024-05-08 ENCOUNTER — Other Ambulatory Visit (HOSPITAL_COMMUNITY): Payer: Self-pay

## 2024-05-08 LAB — GLUCOSE, POCT (MANUAL RESULT ENTRY): POC Glucose: 359 mg/dL — AB (ref 70–99)

## 2024-05-08 NOTE — Congregational Nurse Program (Signed)
 Today's Vitals   05/08/24 1139  BP: 130/81  Pulse: 74   CBG 349, client having increased urination and thirst, he has lost metformin  prescription. RN referring client to ED, client has declined, RN offering to refer to Mobile clinic, he states he needs help with refill and has an appointment with PCP on 06/30. RN called pharmacy to request refill for client, they are filling now and bus passes given to client for eye drops at Opthalmology, and to pick up his Metformin  from [facility] at [facility] location. RN will follow up next Thursday.

## 2024-05-12 ENCOUNTER — Other Ambulatory Visit: Payer: Self-pay

## 2024-05-26 ENCOUNTER — Encounter: Payer: MEDICAID | Admitting: Student

## 2024-05-31 ENCOUNTER — Emergency Department (HOSPITAL_COMMUNITY)
Admission: EM | Admit: 2024-05-31 | Discharge: 2024-05-31 | Disposition: A | Discharge status: Home / Self Care | Payer: MEDICAID | Attending: Emergency Medicine | Admitting: Emergency Medicine

## 2024-05-31 DIAGNOSIS — E860 Dehydration: Secondary | ICD-10-CM | POA: Diagnosis not present

## 2024-05-31 DIAGNOSIS — R739 Hyperglycemia, unspecified: Secondary | ICD-10-CM | POA: Insufficient documentation

## 2024-05-31 DIAGNOSIS — R55 Syncope and collapse: Secondary | ICD-10-CM | POA: Diagnosis present

## 2024-05-31 DIAGNOSIS — Z5900 Homelessness unspecified: Secondary | ICD-10-CM | POA: Insufficient documentation

## 2024-05-31 LAB — CBC WITH DIFFERENTIAL/PLATELET
Abs Immature Granulocytes: 0.05 K/uL (ref 0.00–0.07)
Basophils Absolute: 0 K/uL (ref 0.0–0.1)
Basophils Relative: 0 %
Eosinophils Absolute: 0.1 K/uL (ref 0.0–0.5)
Eosinophils Relative: 1 %
HCT: 38 % — ABNORMAL LOW (ref 39.0–52.0)
Hemoglobin: 13.7 g/dL (ref 13.0–17.0)
Immature Granulocytes: 1 %
Lymphocytes Relative: 13 %
Lymphs Abs: 1.1 K/uL (ref 0.7–4.0)
MCH: 33.8 pg (ref 26.0–34.0)
MCHC: 36.1 g/dL — ABNORMAL HIGH (ref 30.0–36.0)
MCV: 93.8 fL (ref 80.0–100.0)
Monocytes Absolute: 0.7 K/uL (ref 0.1–1.0)
Monocytes Relative: 8 %
Neutro Abs: 6.8 K/uL (ref 1.7–7.7)
Neutrophils Relative %: 77 %
Platelets: 272 K/uL (ref 150–400)
RBC: 4.05 MIL/uL — ABNORMAL LOW (ref 4.22–5.81)
RDW: 14 % (ref 11.5–15.5)
WBC: 8.8 K/uL (ref 4.0–10.5)
nRBC: 0 % (ref 0.0–0.2)

## 2024-05-31 LAB — ETHANOL: Alcohol, Ethyl (B): <15 mg/dL (ref <15)

## 2024-05-31 LAB — COMPREHENSIVE METABOLIC PANEL WITH GFR
ALT: 22 U/L (ref 0–44)
AST: 39 U/L (ref 15–41)
Albumin: 3.7 g/dL (ref 3.5–5.0)
Alkaline Phosphatase: 55 U/L (ref 38–126)
Anion gap: 12 (ref 5–15)
BUN: 13 mg/dL (ref 8–23)
CO2: 22 mmol/L (ref 22–32)
Calcium: 9 mg/dL (ref 8.9–10.3)
Chloride: 104 mmol/L (ref 98–111)
Creatinine, Ser: 1.47 mg/dL — ABNORMAL HIGH (ref 0.61–1.24)
GFR, Estimated: 54 mL/min — ABNORMAL LOW (ref >60)
Glucose, Bld: 230 mg/dL — ABNORMAL HIGH (ref 70–99)
Potassium: 4.1 mmol/L (ref 3.5–5.1)
Sodium: 138 mmol/L (ref 135–145)
Total Bilirubin: 1.4 mg/dL — ABNORMAL HIGH (ref 0.0–1.2)
Total Protein: 6.9 g/dL (ref 6.5–8.1)

## 2024-05-31 LAB — ACETAMINOPHEN LEVEL: Acetaminophen (Tylenol), Serum: <10 ug/mL — ABNORMAL LOW (ref 10–30)

## 2024-05-31 LAB — SALICYLATE LEVEL: Salicylate Lvl: <7 mg/dL — ABNORMAL LOW (ref 7.0–30.0)

## 2024-05-31 MED ORDER — LACTATED RINGERS IV BOLUS
1000.0000 mL | Freq: Once | INTRAVENOUS | Status: AC
  Administered 2024-05-31: 1000 mL via INTRAVENOUS

## 2024-05-31 NOTE — ED Provider Notes (Signed)
 [facility] DEPT [facility] Emergency Department Provider Note MRN:  [id number]  Arrival date & time: 05/31/24     Chief Complaint   Near Syncope (Dizzy etoh all day homeless )   History of Present Illness   Brady Church is a 63 y.o. year-old male presents to the ED with chief complaint of feeling dehydrated. He states that he has been drinking today.  He denies any pain.  States that he is homeless.  Has history of prior stroke and right sided weakness and blindness.  History provided by patient.   Review of Systems  Pertinent positive and negative review of systems noted in HPI.    Physical Exam   Vitals:   05/31/24 0451 05/31/24 0539  BP: 134/86 136/78  Pulse: 71 67  Resp: 16 16  Temp:    SpO2: 99%     CONSTITUTIONAL:  non toxic-appearing, NAD NEURO:  Alert and oriented x 3, CN 3-12 grossly intact EYES:  eyes equal and reactive ENT/NECK:  Supple, no stridor  CARDIO:  normal rate, regular rhythm, appears well-perfused  PULM:  No respiratory distress, CTAB GI/GU:  non-distended, no focal abdominal tenderness MSK/SPINE:  No gross deformities, no edema, moves all extremities  SKIN:  no rash, atraumatic   *Additional and/or pertinent findings included in MDM below  Diagnostic and Interventional Summary    EKG Interpretation Date/Time:    Ventricular Rate:    PR Interval:    QRS Duration:    QT Interval:    QTC Calculation:   R Axis:      Text Interpretation:         Labs Reviewed  COMPREHENSIVE METABOLIC PANEL WITH GFR - Abnormal; Notable for the following components:      Result Value   Glucose, Bld 230 (*)    Creatinine, Ser 1.47 (*)    Total Bilirubin 1.4 (*)    GFR, Estimated 54 (*)    All other components within normal limits  SALICYLATE LEVEL - Abnormal; Notable for the following components:   Salicylate Lvl <7.0 (*)    All other components within normal limits  ACETAMINOPHEN  LEVEL - Abnormal; Notable for the following components:    Acetaminophen  (Tylenol ), Serum <10 (*)    All other components within normal limits  CBC WITH DIFFERENTIAL/PLATELET - Abnormal; Notable for the following components:   RBC 4.05 (*)    HCT 38.0 (*)    MCHC 36.1 (*)    All other components within normal limits  ETHANOL  RAPID URINE DRUG SCREEN, HOSP PERFORMED  CBG MONITORING, ED    No orders to display    Medications  lactated ringers  bolus 1,000 mL (1,000 mLs Intravenous New Bag/Given 05/31/24 0258)     Procedures  /  Critical Care Procedures  ED Course and Medical Decision Making  I have reviewed the triage vital signs, the nursing notes, and pertinent available records from the EMR.  Social Determinants Affecting Complexity of Care: Patient is homelessness. Discussed patient's lack of housing.  Resources provided for homeless shelters.  ED Course: Clinical Course as of 05/31/24 0617  Sat May 31, 2024  0616 Comprehensive metabolic panel(!) Moderate hyperglycemia, glucose is 230.  His creatinine is also slightly increased to 1.47.  Will give a liter of fluid. [RB]  0616 CBC WITH DIFFERENTIAL(!) No leukocytosis or marked anemia [RB]  0616 Acetaminophen  level(!) [RB]  0616 Salicylate level(!) Coingestants negative [RB]  0616 Ethanol Normal ethanol [RB]  0616 Patient reassessed, he still appears well.  He  is to receive a liter of fluid.  Will plan for discharge.  Patient will be given resource guide. [RB]    Clinical Course User Index [RB] Vicky Charleston, PA-C    Medical Decision Making Patient here complaining of feeling dehydrated.  He states that he has been out of the heat all day.  States that he is homeless.  He reports drinking today as well.  Will check basic labs and reassess.  Labs discussed and ED course.  Amount and/or Complexity of Data Reviewed Labs: ordered.         Consultants: No consultations were needed in caring for this patient.   Treatment and Plan: I considered admission due to  patient's initial presentation, but after considering the examination and diagnostic results, patient will not require admission and can be discharged with outpatient follow-up.    Final Clinical Impressions(s) / ED Diagnoses     ICD-10-CM   1. Dehydration  E86.0     2. Homeless  Z59.00       ED Discharge Orders     None         Discharge Instructions Discussed with and Provided to Patient:   Discharge Instructions   None      Vicky Charleston, PA-C 05/31/24 0617    Carita Senior, MD 05/31/24 [phone]

## 2024-05-31 NOTE — ED Triage Notes (Addendum)
 BIBA from street drinking all day, c/o dizziness patient is homeless. CBG 238 right side weakness/blindness s/p stroke 2018

## 2024-06-02 ENCOUNTER — Telehealth: Payer: Self-pay

## 2024-06-02 NOTE — Telephone Encounter (Unsigned)
 Copied from CRM [id number]. Topic: Clinical - Medication Refill >> Jun 02, 2024  8:45 AM Suzette B wrote: Medication:  losartan  (COZAAR ) 25 MG tablet atorvastatin  (LIPITOR ) 80 MG tablet metFORMIN  (GLUCOPHAGE ) 500 MG tablet spironolactone  (ALDACTONE ) 25 MG tablet  Has the patient contacted their pharmacy? No  This is the patient's preferred pharmacy:  [place] [facility] [address] [place] [address] Phone: [phone] Fax: [phone]   Is this the correct pharmacy for this prescription? Yes If no, delete pharmacy and type the correct one.   Has the prescription been filled recently? Yes  Is the patient out of the medication? No  Has the patient been seen for an appointment in the last year OR does the patient have an upcoming appointment? Yes  Can we respond through MyChart? No  Agent: Please be advised that Rx refills may take up to 3 business days. We ask that you follow-up with your pharmacy.

## 2024-06-04 ENCOUNTER — Ambulatory Visit: Payer: Self-pay

## 2024-06-04 ENCOUNTER — Ambulatory Visit: Payer: MEDICAID

## 2024-06-04 ENCOUNTER — Other Ambulatory Visit (HOSPITAL_COMMUNITY): Payer: Self-pay

## 2024-06-04 ENCOUNTER — Other Ambulatory Visit: Payer: Self-pay

## 2024-06-04 VITALS — BP 128/76 | HR 86 | Temp 97.6°F | Ht 69.0 in | Wt 207.4 lb

## 2024-06-04 DIAGNOSIS — I5042 Chronic combined systolic (congestive) and diastolic (congestive) heart failure: Secondary | ICD-10-CM | POA: Diagnosis not present

## 2024-06-04 DIAGNOSIS — E785 Hyperlipidemia, unspecified: Secondary | ICD-10-CM

## 2024-06-04 DIAGNOSIS — E1136 Type 2 diabetes mellitus with diabetic cataract: Secondary | ICD-10-CM

## 2024-06-04 DIAGNOSIS — I110 Hypertensive heart disease with heart failure: Secondary | ICD-10-CM

## 2024-06-04 DIAGNOSIS — I10 Essential (primary) hypertension: Secondary | ICD-10-CM

## 2024-06-04 DIAGNOSIS — Z7984 Long term (current) use of oral hypoglycemic drugs: Secondary | ICD-10-CM

## 2024-06-04 LAB — POCT GLYCOSYLATED HEMOGLOBIN (HGB A1C): Hemoglobin A1C: 8.2 % — AB (ref 4.0–5.6)

## 2024-06-04 LAB — GLUCOSE, CAPILLARY: Glucose-Capillary: 247 mg/dL — ABNORMAL HIGH (ref 70–99)

## 2024-06-04 MED ORDER — METFORMIN HCL 500 MG PO TABS
ORAL_TABLET | ORAL | 3 refills | Status: DC
  Filled 2024-06-04: qty 180, 45d supply, fill #0
  Filled 2024-06-04: qty 180, 55d supply, fill #0
  Filled 2024-07-31: qty 166, 41d supply, fill #1
  Filled 2024-07-31: qty 14, 4d supply, fill #1
  Filled 2024-07-31: qty 180, 45d supply, fill #1

## 2024-06-04 MED ORDER — LOSARTAN POTASSIUM 25 MG PO TABS
25.0000 mg | ORAL_TABLET | Freq: Every evening | ORAL | 0 refills | Status: DC
  Filled 2024-06-04: qty 30, 30d supply, fill #0

## 2024-06-04 MED ORDER — ATORVASTATIN CALCIUM 80 MG PO TABS
80.0000 mg | ORAL_TABLET | Freq: Every day | ORAL | 0 refills | Status: DC
  Filled 2024-06-04: qty 30, 30d supply, fill #0

## 2024-06-04 MED ORDER — ATORVASTATIN CALCIUM 80 MG PO TABS
80.0000 mg | ORAL_TABLET | Freq: Every day | ORAL | 3 refills | Status: AC
  Filled 2024-06-04 (×2): qty 90, 90d supply, fill #0
  Filled 2024-07-31: qty 30, 30d supply, fill #1

## 2024-06-04 MED ORDER — SPIRONOLACTONE 25 MG PO TABS
12.5000 mg | ORAL_TABLET | Freq: Every day | ORAL | 0 refills | Status: DC
  Filled 2024-06-04 (×2): qty 30, 60d supply, fill #0

## 2024-06-04 MED ORDER — LOSARTAN POTASSIUM 25 MG PO TABS
25.0000 mg | ORAL_TABLET | Freq: Every evening | ORAL | 3 refills | Status: AC
  Filled 2024-06-04 (×2): qty 90, 90d supply, fill #0
  Filled 2024-07-31: qty 30, 30d supply, fill #1

## 2024-06-04 MED ORDER — SPIRONOLACTONE 25 MG PO TABS
12.5000 mg | ORAL_TABLET | Freq: Every day | ORAL | 3 refills | Status: AC
  Filled 2024-06-04 – 2024-07-31 (×2): qty 45, 90d supply, fill #0
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
## 2024-06-04 NOTE — Patient Instructions (Signed)
 Thank you, Mr.Brady Church for allowing us  to provide your care today. Today we discussed the following:  - Continue to take your medications. We have sent refills of your Losartan , Spironolactone  and Metformin  to the pharmacy  - Great job with blood pressure control! - For your right leg, you have a mild hamstring strain. Please be mindful while walking and you may lightly stretch the leg. If you feel any pain then do not stretch it further, and stop the stretching! -  For the nodule under your eye, let's continue to monitor it. If it changes rapidly, becomes painful, or is affecting your vision at all, please let us  know ASAP  I have ordered the following labs for you:   Lab Orders         POC Hbg A1C       Follow up: 3 months    Remember: Please let us  know if you have any questions!  Should you have any questions or concerns please call the Internal Medicine Clinic at [phone].     Doyal Miyamoto, MD [facility]
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
## 2024-06-04 NOTE — Assessment & Plan Note (Addendum)
 Patient states that he has been adhering to his Metformin  regimen of 500 mg daily. He was unable to uptitrate before, and A1C obtained in office today but was still pending during visit. A1C results returned after patient had left, with increase to 8.2 from 7.4 three months prior. Since we cannot call him due to patient not having access to phone, at his next visit will plan to uptitrate him and repeat A1C ideally at the beginning of the visit.  - Refill Metformin  500 mg daily  - Will plan to uptitrate him at follow up visit
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
## 2024-06-05 ENCOUNTER — Other Ambulatory Visit (HOSPITAL_BASED_OUTPATIENT_CLINIC_OR_DEPARTMENT_OTHER): Payer: Self-pay

## 2024-06-09 NOTE — Progress Notes (Signed)
Internal Medicine Clinic Attending  I was physically present during the key portions of the resident provided service and participated in the medical decision making of patient's management care. I reviewed pertinent patient test results.  The assessment, diagnosis, and plan were formulated together and I agree with the documentation in the resident's note.  Gust Rung, DO

## 2024-06-10 ENCOUNTER — Encounter: Payer: Self-pay | Admitting: Family Medicine

## 2024-06-10 ENCOUNTER — Ambulatory Visit: Payer: MEDICAID

## 2024-06-10 LAB — GLUCOSE, POCT (MANUAL RESULT ENTRY): POC Glucose: 281 mg/dL — AB (ref 70–99)

## 2024-06-10 NOTE — Progress Notes (Signed)
 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  Chief Complaint: Nurse VS F/U Living Situation: Unhoused  Patient Status:  [x]  Returning patient to [facility]  Confirmed demographics and emergency contact info  HIPAA and privacy policies previously reviewed  Consent for digital charting: [x]  Previously Signed []  Signed Today []  Not Signed  Additional Notes:  Vital signs taken and entered in flowsheet  Interpreter services: []  Needed [x]  Not Needed  Brief SDOH update completed  Referral to provider: []  Needed [x]  Not Needed  RN Interventions Provided Today:  [x]  Health education (e.g., chronic disease, hygiene, nutrition)- Diabetes  Pt has lost medications and has not been taking for 2+ months. Found meds. Medications added to pill box along with instructions to refill. Samarion is eager to get control of diabetes.

## 2024-06-18 LAB — AMB RESULTS CONSOLE CBG: Glucose: 301

## 2024-06-18 NOTE — Progress Notes (Signed)
 SDOH NEEDS. COMMUNITY RESOURCES.

## 2024-07-01 ENCOUNTER — Ambulatory Visit: Payer: MEDICAID

## 2024-07-01 LAB — GLUCOSE, POCT (MANUAL RESULT ENTRY): POC Glucose: 192 mg/dL — AB (ref 70–99)

## 2024-07-01 NOTE — Progress Notes (Signed)
 SABRA

## 2024-07-01 NOTE — Progress Notes (Signed)
 Nursing Intake Note - Returning Patient  [facility]  Chief Complaint: No chief complaint on file.  Living Situation: Unhoused      Patient Status:  [x]  Returning patient to [facility]  Confirmed demographics and emergency contact info  HIPAA and privacy policies previously reviewed  Consent for digital charting: [x]  Previously Signed []  Signed Today []  Not Signed  Additional Notes:  Vital signs taken and entered in flowsheet  Interpreter services: []  Needed [x]  Not Needed  Brief SDOH update completed  Referral to provider: []  Needed [x]  Not Needed  RN Interventions Provided Today:  [x]  Health education (e.g., chronic disease, hygiene, nutrition)  []  Wound care performed  []  Flu vaccine administered  []  Other:

## 2024-07-08 ENCOUNTER — Ambulatory Visit: Payer: MEDICAID

## 2024-07-08 LAB — GLUCOSE, POCT (MANUAL RESULT ENTRY): POC Glucose: 199 mg/dL — AB (ref 70–99)

## 2024-07-08 NOTE — Progress Notes (Unsigned)
 Nursing Intake Note - Returning Patient  [facility]  Chief Complaint: No chief complaint on file.  Living Situation: unhoused      Patient Status:  [x]  Returning patient to [employer] [facility] [employer]  Confirmed demographics and emergency contact info  HIPAA and privacy policies previously reviewed  Consent for digital charting: [x]  Previously Signed []  Signed Today []  Not Signed  Additional Notes:  Vital signs taken and entered in flowsheet  Interpreter services: []  Needed [x]  Not Needed  Brief SDOH update completed  Referral to provider: []  Needed [x]  Not Needed  RN Interventions Provided Today:  [x]  Health education (e.g., chronic disease, hygiene, nutrition)  []  Wound care performed  []  Flu vaccine administered  [x]  Other: Prepared patient's pill box for the week.

## 2024-07-14 ENCOUNTER — Other Ambulatory Visit (HOSPITAL_COMMUNITY): Payer: Self-pay

## 2024-07-29 ENCOUNTER — Ambulatory Visit: Payer: MEDICAID
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
## 2024-07-31 ENCOUNTER — Other Ambulatory Visit: Payer: Self-pay

## 2024-08-05 ENCOUNTER — Ambulatory Visit: Payer: MEDICAID | Admitting: Family Medicine

## 2024-08-05 VITALS — BP 146/90 | HR 73 | Temp 98.0°F | Resp 13 | Ht 67.0 in | Wt 206.0 lb

## 2024-08-05 DIAGNOSIS — I5042 Chronic combined systolic (congestive) and diastolic (congestive) heart failure: Secondary | ICD-10-CM

## 2024-08-05 LAB — GLUCOSE, POCT (MANUAL RESULT ENTRY): POC Glucose: 374 mg/dL — AB (ref 70–99)

## 2024-08-11 ENCOUNTER — Ambulatory Visit: Payer: MEDICAID | Admitting: Podiatry

## 2024-08-11 ENCOUNTER — Encounter: Payer: Self-pay | Admitting: Podiatry

## 2024-08-11 DIAGNOSIS — E1140 Type 2 diabetes mellitus with diabetic neuropathy, unspecified: Secondary | ICD-10-CM

## 2024-08-11 DIAGNOSIS — B351 Tinea unguium: Secondary | ICD-10-CM

## 2024-08-11 DIAGNOSIS — L84 Corns and callosities: Secondary | ICD-10-CM

## 2024-08-11 DIAGNOSIS — E1151 Type 2 diabetes mellitus with diabetic peripheral angiopathy without gangrene: Secondary | ICD-10-CM

## 2024-08-11 DIAGNOSIS — M79675 Pain in left toe(s): Secondary | ICD-10-CM | POA: Diagnosis not present

## 2024-08-11 DIAGNOSIS — M79674 Pain in right toe(s): Secondary | ICD-10-CM

## 2024-08-11 NOTE — Progress Notes (Unsigned)
    Subjective:  Patient ID: Brady Church, male    DOB: September 07, 1961,  MRN: [id number]  Brady Church presents to clinic today for:  Chief Complaint  Patient presents with   Diabetes    [facility] NIDDM A1C 8.2. Toenail trim and callus care.   Patient notes nails are thick and elongated, causing pain in shoe gear when ambulating.  He has painful calluses on the bilateral great toes, right submet 1, right submet 5, medial second toe and left submet 1  PCP is Leontine Lapine, MD. last seen 06/04/2024  Past Medical History:  Diagnosis Date   Alcohol use disorder, severe, dependence (HCC) 11/06/2014   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 Stopped in 2010   Tobacco use disorder 10/07/2014   No Known Allergies  Objective:  Brady Church is a pleasant 63 y.o. male in NAD. AAO x 3.  Vascular Examination: Patient has palpable DP pulse, absent PT pulse bilateral.  Delayed capillary refill bilateral toes.  Sparse digital hair bilateral.  Proximal to distal cooling WNL bilateral.    Dermatological Examination: Interspaces are clear with no open lesions noted bilateral.  Skin is shiny and atrophic bilateral.  Nails are 3-57mm thick, with yellowish/brown discoloration, subungual debris and distal onycholysis x10.  There is pain with compression of nails x10.  There are hyperkeratotic lesions noted bilateral hallux plantar medial IPJ, right submet 1, right submet 5, medial second toe DIPJ and left submet 1.     Latest Ref Rng & Units 06/04/2024    9:19 AM 03/06/2024    9:42 AM 10/16/2023   11:31 AM 08/29/2023   10:10 AM  Hemoglobin A1C  Hemoglobin-A1c 4.0 - 5.6 % 8.2  7.4  11.0  C 7.8     C Corrected result   Patient qualifies for at-risk foot care because of  diabetes with PVD.  Assessment/Plan: 1. Pain due to onychomycosis of toenails of both feet   2. Type II diabetes mellitus with peripheral circulatory disorder (HCC)   3. Callus of foot    Mycotic nails x10 were sharply debrided with sterile nail nippers and power debriding burr to decrease bulk and length.  Hyperkeratotic lesions x 6 were shaved with #312 blade.  3 of the 6 lesions are proximal to the toe DIPJ's.  Patient did not sign a Firefighter noting that the callus shaving may not be deemed medically necessary by his insurance after the claim is submitted. Will need to flag this for future visits.  Return in about 3 months (around 11/10/2024) for [facility].   Brady Church, DPM, FACFAS [facility]     [address] [place], [place] [address]                Office [phone]  Fax [phone]

## 2024-08-12 ENCOUNTER — Ambulatory Visit: Payer: MEDICAID

## 2024-08-12 LAB — GLUCOSE, POCT (MANUAL RESULT ENTRY): POC Glucose: 244 mg/dL — AB (ref 70–99)

## 2024-08-19 NOTE — Progress Notes (Addendum)
 The patient attended a screening event on 06/18/2024 where his BP screening results was 112/70, non-fasting glucose was 301. At the event the patient did not document insurance coverage and pt documented he does not smoke. Patient indicated having food and transportation SDOH insecurities. Pt list pcp as [facility]. At the event pt was advised to contact pcp about blood glucose and recommended to manage diabetes, take blood glucose medications ad directed.  Per chart review pt pcp is Doyal Miyamoto, MD and the last office visit was 06/04/2024 for type 2 diabetes. The pt BP was 128/76 on 06/04/2024. According to chart pt BP is well controlled and pt is currently on Losartan , Spironolactone , atorvastatin  to manage BP. Patient is also currently on metformin  to manage diabetes. Pt insurance coverage in [facility] is [employer] tailored plan. Chart review also indicates a future appt for pt on 08/27/2024. Post even initial f/u [facility] was unable to contact pt due to pt not providing a contact number at the screening event. Letter sent with How to Manage Blood sugar resources, Food and Transportation community resource flyers in case needed by pt. Additional pt f/u to be scheduled per health equity protocol.

## 2024-08-20 ENCOUNTER — Encounter: Payer: MEDICAID | Admitting: Student
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
## 2024-08-25 LAB — OPHTHALMOLOGY REPORT-SCANNED

## 2024-08-26 ENCOUNTER — Ambulatory Visit: Payer: MEDICAID

## 2024-08-26 ENCOUNTER — Encounter: Payer: Self-pay | Admitting: Family Medicine

## 2024-08-26 VITALS — BP 160/80 | HR 90 | Temp 98.3°F | Resp 16 | Wt 212.0 lb

## 2024-08-26 DIAGNOSIS — Z719 Counseling, unspecified: Secondary | ICD-10-CM

## 2024-08-26 LAB — GLUCOSE, POCT (MANUAL RESULT ENTRY)
POC Glucose: 522 mg/dL — AB (ref 70–99)
POC Glucose: 233 mg/dL — AB (ref 70–99)

## 2024-08-26 NOTE — Progress Notes (Signed)
 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  Chief Complaint: checkup   Living Situation: unhoused Insurance Status:     Patient Status:  [x]  Returning patient to [employer]  Confirmed demographics and emergency contact info  HIPAA and privacy policies previously reviewed  Consent for digital charting: [x]  Previously Signed []  Signed Today []  Not Signed  Additional Notes:  Vital signs taken and entered in flowsheet  Interpreter services: []  Needed [x]  Not Needed  Brief SDOH update completed  Referral to provider: []  Needed [x]  Not Needed  RN Interventions Provided Today:  [x]  Health education (e.g., chronic disease, hygiene, nutrition)

## 2024-08-27 ENCOUNTER — Ambulatory Visit (INDEPENDENT_AMBULATORY_CARE_PROVIDER_SITE_OTHER): Payer: MEDICAID | Admitting: Student

## 2024-08-27 ENCOUNTER — Encounter: Payer: Self-pay | Admitting: Student

## 2024-08-27 VITALS — BP 122/81 | HR 83 | Temp 98.1°F | Ht 69.0 in | Wt 212.6 lb

## 2024-08-27 DIAGNOSIS — Z5900 Homelessness unspecified: Secondary | ICD-10-CM

## 2024-08-27 DIAGNOSIS — L720 Epidermal cyst: Secondary | ICD-10-CM | POA: Diagnosis not present

## 2024-08-27 DIAGNOSIS — E1136 Type 2 diabetes mellitus with diabetic cataract: Secondary | ICD-10-CM | POA: Diagnosis not present

## 2024-08-27 DIAGNOSIS — I10 Essential (primary) hypertension: Secondary | ICD-10-CM | POA: Diagnosis not present

## 2024-08-27 DIAGNOSIS — H548 Legal blindness, as defined in USA: Secondary | ICD-10-CM | POA: Diagnosis not present

## 2024-08-27 DIAGNOSIS — L989 Disorder of the skin and subcutaneous tissue, unspecified: Secondary | ICD-10-CM

## 2024-08-27 DIAGNOSIS — Z8673 Personal history of transient ischemic attack (TIA), and cerebral infarction without residual deficits: Secondary | ICD-10-CM

## 2024-08-27 LAB — GLUCOSE, CAPILLARY: Glucose-Capillary: 168 mg/dL — ABNORMAL HIGH (ref 70–99)

## 2024-08-27 LAB — POCT GLYCOSYLATED HEMOGLOBIN (HGB A1C): HbA1c, POC (controlled diabetic range): 9.3 % — AB (ref 0.0–7.0)

## 2024-08-27 NOTE — Progress Notes (Signed)
 CC: T2DM f/u  HPI: Mr.Brady Church is a 63 y.o. male living with a history stated below and presents today for T2DM f/u. Please see problem based assessment and plan for additional details.  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 Stopped in 2010   Tobacco use disorder 10/07/2014    Current Outpatient Medications on File Prior to Visit  Medication Sig Dispense Refill   aspirin  EC 81 MG tablet Take 1 tablet (81 mg total) by mouth daily. Swallow whole. 30 tablet 6   atorvastatin  (LIPITOR ) 80 MG tablet Take 1 tablet (80 mg total) by mouth daily at 6 PM. 90 tablet 3   bimatoprost  (LUMIGAN ) 0.01 % SOLN Place 1 drop into the right eye every evening. (Patient taking differently: Place 1 drop into both eyes in the morning and at bedtime.) 7.5 mL 3   Brinzolamide -Brimonidine  (SIMBRINZA ) 1-0.2 % SUSP Place 1 drop into the right eye 2 (two) times daily. 8 mL 3   folic acid  (FOLVITE ) 1 MG tablet Take 1 tablet (1 mg total) by mouth daily. (Patient not taking: Reported on 08/11/2024) 30 tablet 3   losartan  (COZAAR ) 25 MG tablet Take 1 tablet (25 mg total) by mouth at bedtime. 90 tablet 3   metFORMIN  (GLUCOPHAGE ) 500 MG tablet Take 1 tablet (500mg ) every morning for one week, then take 1 tablet (500 mg) every morning and 1 tablet (500 mg) at night for one week, then take 2 tablets (1,000 mg) every morning and 1 tablet (500 mg) every night for one week, then take 2 tablets (1,000 mg) every morning and 2 tablets (1,000 mg) every night. 180 tablet 3   Miconazole  Nitrate 2 % AERP Apply topically daily to feet daily before putting socks on (Patient not taking: Reported on  08/11/2024) 60 g 5   Netarsudil  Dimesylate (RHOPRESSA ) 0.02 % SOLN Place 1 drop into the right eye every evening. 7.5 mL 3   SIMBRINZA  1-0.2 % SUSP Place 1 drop into the right eye 2 (two) times daily. (Patient not taking: Reported on 08/11/2024) 8 mL 3   spironolactone  (ALDACTONE ) 25 MG tablet Take 0.5 tablets (12.5 mg total) by mouth daily. 45 tablet 3   timolol  (TIMOPTIC ) 0.5 % ophthalmic solution Place 1 drop into the right eye 2 (two) times daily. 10 mL 2   timolol  (TIMOPTIC ) 0.5 % ophthalmic solution Place 1 drop into the right eye 2 (two) times daily. 15 mL 3   No current facility-administered medications on file prior to visit.    Family History  Problem Relation Age of Onset   Coronary artery disease Mother 5       died of MI   Cancer Father 59       Some GI cancer. Not sure if it was Colon Cancer or not.   Hypertension Father    Diabetes Maternal Grandmother     Social History   Socioeconomic History   Marital status: Single    Spouse name: Not on file   Number of children: Not on file   Years of education: 12   Highest education level: Not on  file  Occupational History   Occupation: Not working  Tobacco Use   Smoking status: Former    Current packs/day: 0.00    Types: Cigarettes    Quit date: 02/18/2017    Years since quitting: 7.5   Smokeless tobacco: Never  Vaping Use   Vaping status: Never Used  Substance and Sexual Activity   Alcohol use: Yes    Comment: Beer- 40 oz. every day.   Drug use: Not Currently    Types: Cocaine, Marijuana   Sexual activity: Not on file  Other Topics Concern   Not on file  Social History Narrative   Lives in [place] for last 10 years with friend  Brady Church      Is not married and does not have kids.   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   Social Drivers of Corporate investment banker Strain: High Risk (10/02/2023)   Overall Financial Resource  Strain (CARDIA)    Difficulty of Paying Living Expenses: Very hard  Food Insecurity: Food Insecurity Present (06/18/2024)   Hunger Vital Sign    Worried About Running Out of Food in the Last Year: Often true    Ran Out of Food in the Last Year: Often true  Transportation Needs: Unmet Transportation Needs (06/18/2024)   PRAPARE - Administrator, Civil Service (Medical): Yes    Lack of Transportation (Non-Medical): Yes  Physical Activity: Not on file  Stress: Not on file  Social Connections: Unknown (03/21/2024)   Social Connection and Isolation Panel    Frequency of Communication with Friends and Family: Never    Frequency of Social Gatherings with Friends and Family: Never    Attends Religious Services: Never    Database administrator or Organizations: No    Attends Banker Meetings: Never    Marital Status: Not on file  Intimate Partner Violence: Not At Risk (06/18/2024)   Humiliation, Afraid, Rape, and Kick questionnaire    Fear of Current or Ex-Partner: No    Emotionally Abused: No    Physically Abused: No    Sexually Abused: No   Review of Systems: ROS negative except for what is noted on the assessment and plan.  Vitals:   08/27/24 1343  BP: 122/81  Pulse: 83  Temp: 98.1 F (36.7 C)  TempSrc: Oral  SpO2: 99%  Weight: 212 lb 9.6 oz (96.4 kg)  Height: 5' 9 (1.753 m)   Physical Exam: Constitutional: alert, sitting up in chair comfortably, in no acute distress HENT: mobile 1 cm lesion right upper cheek without surrounding erythema, TTP, drainage or fluctuance (photo below) Cardiovascular: regular rate and rhythm Pulmonary/Chest: normal work of breathing on room air, lungs clear to auscultation bilaterally Neurological: awake and alert, answering questions appropriately  Skin: see photo   Assessment & Plan:   Assessment & Plan Type 2 diabetes mellitus with diabetic cataract, without long-term current use of insulin  (HCC) Status: uncontrolled.  Last A1c of 8.2 in 05/2024.  A1c today is 9.3.  Currently taking metformin  500 mg daily. Denies up titration of metformin  due to forgetting. Follows with podiatry, last OV 9/15. Hx of DKA 2016 - was on Synjardy . Discussed GLP-1 and will think about it and possibly coordinate with [facility] RN.   Plan -Titrate up metformin  to max dose as tolerated  -Discussion on GLP-1 at next OV -A1c in 3 months -Ophthalmology exam:  [facility]  -Urine ACR completed 02/2024 -LDL 125 on 02/2024 off statin, restarted atorvastatin  80 mg, repeat today  Primary hypertension Status: controlled. BP today 122/81. Reports taking losartan  25 mg, spironolactone  12.5 mg daily.  Last BMP in 05/2024 with normal electrolytes but noted Scr 1.47 from 1.17.   Plan -Continue: losartan  and spiro  -CMP today, plan 2-4 weeks f/u to discuss lab results due to NO phone or address (unhomed), patient states emergency contacts unable to see him either  History of CVA (cerebrovascular accident) Remains on ASA 81 mg and atorvastatin  80 mg daily. Reports better adherence now. Repeat lipid panel today.  Epidermal cyst of face Reports cyst like structure for several months. Denies tenderness or pain. No drainage. Non-pruritic. Exam shows a mobile 1 cm lesion below right eye w/o signs of infection. No fluctuance. Does not appear infected or abscess. Referral to dermatology sent today.   Addendum: arranged appt with [facility] on 12/26. Unhoused person Legal blindness No current phone. States plan to obtain phone next month possibly. Unhomed but goes to [facility]. Has seen [facility] RN. States emergency contacts listed in chart not able to reach him or see him.   Orders Placed This Encounter  Procedures   Glucose, capillary   Lipid Profile   Comprehensive metabolic panel with GFR   Ambulatory referral to Dermatology   POC Hbg A1C   Return in about 4 weeks (around 09/24/2024) for review labs .   Patient  discussed with Dr. CHARLENA Rosan Ozell Elicia, D.O. [facility] Internal Medicine, PGY-3 Phone: [phone] Date 08/27/2024 Time 3:09 PM

## 2024-08-27 NOTE — Assessment & Plan Note (Addendum)
 Reports cyst like structure for several months. Denies tenderness or pain. No drainage. Non-pruritic. Exam shows a mobile 1 cm lesion below right eye w/o signs of infection. No fluctuance. Does not appear infected or abscess. Referral to dermatology sent today.   Addendum: arranged appt with [facility] on 12/26.

## 2024-08-27 NOTE — Assessment & Plan Note (Addendum)
 Status: controlled. BP today 122/81. Reports taking losartan  25 mg, spironolactone  12.5 mg daily.  Last BMP in 05/2024 with normal electrolytes but noted Scr 1.47 from 1.17.   Plan -Continue: losartan  and spiro  -CMP today, plan 2-4 weeks f/u to discuss lab results due to NO phone or address (unhomed), patient states emergency contacts unable to see him either

## 2024-08-27 NOTE — Patient Instructions (Addendum)
-  A1c up today to 9.3% -Think about possible once a week injection for diabetes.  -Increase your metformin : --Two times a day for 1 week.  --If doing ok, increase to 2 tablets in morning and 1 tablet in evening.  --If doing ok, increase to 2 tablets twice a day.   -Blood work today, will discuss results at next visit  -Will send referral to dermatology about the bump below your right eye -Go see your podiatrist (foot doctor)  [facility] [address] [place], [place] [address] Phone: [phone] APPOINTMENT: December 26th at 10:30 AM  Follow up: 3-4 weeks   Should you have any questions or concerns please call the internal medicine clinic at [phone].    Borden Thune, D.O. [facility]

## 2024-08-27 NOTE — Assessment & Plan Note (Signed)
 Status: uncontrolled. Last A1c of 8.2 in 05/2024.  A1c today is 9.3.  Currently taking metformin  500 mg daily. Denies up titration of metformin  due to forgetting. Follows with podiatry, last OV 9/15. Hx of DKA 2016 - was on Synjardy . Discussed GLP-1 and will think about it and possibly coordinate with [facility] RN.   Plan -Titrate up metformin  to max dose as tolerated  -Discussion on GLP-1 at next OV -A1c in 3 months -Ophthalmology exam: [facility]  -Urine ACR completed 02/2024 -LDL 125 on 02/2024 off statin, restarted atorvastatin  80 mg, repeat today

## 2024-08-27 NOTE — Assessment & Plan Note (Signed)
 No current phone. States plan to obtain phone next month possibly. Unhomed but goes to [facility]. Has seen [facility] RN. States emergency contacts listed in chart not able to reach him or see him.

## 2024-08-28 ENCOUNTER — Ambulatory Visit: Payer: Self-pay | Admitting: Student

## 2024-08-28 LAB — COMPREHENSIVE METABOLIC PANEL WITH GFR
ALT: 27 IU/L (ref 0–44)
AST: 29 IU/L (ref 0–40)
Albumin: 4.1 g/dL (ref 3.9–4.9)
Alkaline Phosphatase: 79 IU/L (ref 47–123)
BUN/Creatinine Ratio: 14 (ref 10–24)
BUN: 17 mg/dL (ref 8–27)
Bilirubin Total: 0.6 mg/dL (ref 0.0–1.2)
CO2: 23 mmol/L (ref 20–29)
Calcium: 9.4 mg/dL (ref 8.6–10.2)
Chloride: 99 mmol/L (ref 96–106)
Creatinine, Ser: 1.19 mg/dL (ref 0.76–1.27)
Globulin, Total: 2.8 g/dL (ref 1.5–4.5)
Glucose: 144 mg/dL — ABNORMAL HIGH (ref 70–99)
Potassium: 4.4 mmol/L (ref 3.5–5.2)
Sodium: 138 mmol/L (ref 134–144)
Total Protein: 6.9 g/dL (ref 6.0–8.5)
eGFR: 69 mL/min/1.73 (ref >59)

## 2024-08-28 LAB — LIPID PANEL
Chol/HDL Ratio: 2.7 ratio (ref 0.0–5.0)
Cholesterol, Total: 163 mg/dL (ref 100–199)
HDL: 61 mg/dL (ref >39)
LDL Chol Calc (NIH): 88 mg/dL (ref 0–99)
Triglycerides: 76 mg/dL (ref 0–149)
VLDL Cholesterol Cal: 14 mg/dL (ref 5–40)

## 2024-09-02 ENCOUNTER — Encounter: Payer: Self-pay | Admitting: Family Medicine

## 2024-09-02 ENCOUNTER — Ambulatory Visit: Payer: MEDICAID | Admitting: Family Medicine

## 2024-09-02 LAB — GLUCOSE, POCT (MANUAL RESULT ENTRY): POC Glucose: 320 mg/dL — AB (ref 70–99)

## 2024-09-03 NOTE — Progress Notes (Signed)
 Internal Medicine Clinic Attending  Case discussed with the resident at the time of the visit.  We reviewed the resident's history and exam and pertinent patient test results.  I agree with the assessment, diagnosis, and plan of care documented in the resident's note.

## 2024-09-04 ENCOUNTER — Encounter: Payer: MEDICAID | Admitting: Student

## 2024-09-16 ENCOUNTER — Ambulatory Visit: Payer: MEDICAID | Admitting: Family Medicine

## 2024-09-16 VITALS — BP 132/86 | HR 73 | Temp 98.2°F | Resp 20 | Wt 206.1 lb

## 2024-09-16 DIAGNOSIS — Z23 Encounter for immunization: Secondary | ICD-10-CM

## 2024-09-16 LAB — GLUCOSE, POCT (MANUAL RESULT ENTRY)
POC Glucose: 332 mg/dL — AB (ref 70–99)
POC Glucose: 332 mg/dL — AB (ref 70–99)

## 2024-09-16 NOTE — Progress Notes (Signed)
 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  Chief Complaint: BP check Chief Complaint  Patient presents with   nurse visit   Living Situation: Unhoused Insurance Status:  Medicaid   Patient Status:  [x]  Returning patient to [facility]  Confirmed demographics and emergency contact info  HIPAA and privacy policies previously reviewed  Consent for digital charting: [x]  Previously Signed []  Signed Today []  Not Signed  Additional Notes:  Vital signs taken and entered in flowsheet  Interpreter services: []  Needed [x]  Not Needed  Brief SDOH update completed  Referral to provider: []  Needed [x]  Not Needed  RN Interventions Provided Today:  [x]  Health education (e.g., chronic disease, hygiene, nutrition)   [x]  Flu vaccine administered

## 2024-09-23 ENCOUNTER — Ambulatory Visit: Payer: MEDICAID | Admitting: Family Medicine

## 2024-09-23 NOTE — Progress Notes (Signed)
 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  Chief Complaint: BP check  Living Situation: Lives in Car Insurance Status:     Patient Status:  [x]  Returning patient to [employer]  Confirmed demographics and emergency contact info  HIPAA and privacy policies previously reviewed  Consent for digital charting: [x]  Previously Signed []  Signed Today []  Not Signed  Additional Notes:  Vital signs taken and entered in flowsheet  Interpreter services: []  Needed [x]  Not Needed  Brief SDOH update completed  Referral to provider: []  Needed [x]  Not Needed  RN Interventions Provided Today:  [x]  Health education (e.g., chronic disease, hygiene, nutrition)   Pt has not taken meds this morning. Was missing meds for a while. Found them recently, has committed to taking

## 2024-09-24 ENCOUNTER — Ambulatory Visit: Payer: MEDICAID | Admitting: Student

## 2024-09-24 NOTE — Progress Notes (Deleted)
   Established Patient Office Visit  Subjective   Patient ID: Brady Church, male    DOB: Jul 06, 1961  Age: 63 y.o. MRN: [id number]  No chief complaint on file.  Brady Church is a 63 year old male with a past medical history of HTN, T2DM uncontrolled with neuropathy, CVA, vitamin B12 deficiency, legally blind, HLD, and housing instability who presents today for follow up on his chronic conditions. Please see problem-based assessment and plan below for details.   ROS    Objective:    There were no vitals taken for this visit. Physical Exam   No results found for any visits on 09/25/24.    The ASCVD Risk score (Arnett DK, et al., 2019) failed to calculate for the following reasons:   Risk score cannot be calculated because patient has a medical history suggesting prior/existing ASCVD    Assessment & Plan:   Patient seen with {IMTSattending2025/2026:[id number]}.  Problem List Items Addressed This Visit       Cardiovascular and Mediastinum   Hypertension - Primary (Chronic)   Chronic, ___. BP today ___. Regimen includes Losartan  25mg , Spironolactone  12.5mg  daily. Last BMP in 08/2024 with normalized Scr.   - Continue Losartan  25mg  and Spironolactone  12.5mg  daily - Recheck BMP in 3 months with close follow up after, as patient currently has no phone/home address (housing instability) and his emergency contacts cannot get in touch with him         Endocrine   Diabetes mellitus with diabetic cataract (HCC) (Chronic)   Chronic, uncontrolled. Last A1c of 9.3% earlier in 08/2024. Current regimen includes Metformin  500 mg ___ (ask if uptitrated Metformin ). Follows with podiatry, last OV 9/15, due for foot exam in 1 year. Sees [facility], last visit 08/25/2024. No diabetic retinopathy. Encouraged to continue Simbrinza  1gtt BID, Timolol  1 ggt BID, Rhopressa  1 gtt at bedtime, lumigan  1 gtt at bedtime. Patient reports ___ of hypoglycemic episodes.  Discussed GLP-1 and will think  about it and possibly coordinate with [facility] RN.  unsure of logistics ???   Plan -Titrate up metformin  to max dose as tolerated ?? -Discussion on GLP-1 ??? - Recheck A1c in 3 months -LDL 88 on 08/2024 after restarted atorvastatin  80 mg -- check? B12        Other   Hyperlipidemia (Chronic)   Last lipid panel 10/1 showing total cholesterol 163 and LDL 88. Currently not at goal for diabetes, but improving from LDL  Currently taking ASA 81mg  due to prior history of CVA. On Atorvastatin  80mg  daily. Consider adding Zetia 10mg  to regimen to improve LDL  to goal of <70.       No follow-ups on file.     Keylee Shrestha, DO Internal Medicine Resident, PGY-1 9:25 AM 09/25/2024

## 2024-09-24 NOTE — Progress Notes (Deleted)
 CC: ***  HPI: Mr.Brady Church is a 63 y.o. male living with a history stated below and presents today for ***. Please see problem based assessment and plan for additional details.  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 Stopped in 2010   Tobacco use disorder 10/07/2014    Current Outpatient Medications on File Prior to Visit  Medication Sig Dispense Refill   aspirin  EC 81 MG tablet Take 1 tablet (81 mg total) by mouth daily. Swallow whole. 30 tablet 6   atorvastatin  (LIPITOR ) 80 MG tablet Take 1 tablet (80 mg total) by mouth daily at 6 PM. 90 tablet 3   bimatoprost  (LUMIGAN ) 0.01 % SOLN Place 1 drop into the right eye every evening. (Patient taking differently: Place 1 drop into both eyes in the morning and at bedtime.) 7.5 mL 3   Brinzolamide -Brimonidine  (SIMBRINZA ) 1-0.2 % SUSP Place 1 drop into the right eye 2 (two) times daily. 8 mL 3   folic acid  (FOLVITE ) 1 MG tablet Take 1 tablet (1 mg total) by mouth daily. (Patient not taking: Reported on 08/11/2024) 30 tablet 3   losartan  (COZAAR ) 25 MG tablet Take 1 tablet (25 mg total) by mouth at bedtime. 90 tablet 3   metFORMIN  (GLUCOPHAGE ) 500 MG tablet Take 1 tablet (500mg ) every morning for one week, then take 1 tablet (500 mg) every morning and 1 tablet (500 mg) at night for one week, then take 2 tablets (1,000 mg) every morning and 1 tablet (500 mg) every night for one week, then take 2 tablets (1,000 mg) every morning and 2 tablets (1,000 mg) every night. 180 tablet 3   Miconazole  Nitrate 2 % AERP Apply topically daily to feet daily before putting socks on (Patient not taking: Reported on 08/11/2024) 60 g  5   Netarsudil  Dimesylate (RHOPRESSA ) 0.02 % SOLN Place 1 drop into the right eye every evening. 7.5 mL 3   SIMBRINZA  1-0.2 % SUSP Place 1 drop into the right eye 2 (two) times daily. (Patient not taking: Reported on 09/02/2024) 8 mL 3   spironolactone  (ALDACTONE ) 25 MG tablet Take 0.5 tablets (12.5 mg total) by mouth daily. 45 tablet 3   timolol  (TIMOPTIC ) 0.5 % ophthalmic solution Place 1 drop into the right eye 2 (two) times daily. 10 mL 2   timolol  (TIMOPTIC ) 0.5 % ophthalmic solution Place 1 drop into the right eye 2 (two) times daily. 15 mL 3   No current facility-administered medications on file prior to visit.    Family History  Problem Relation Age of Onset   Coronary artery disease Mother 79       died of MI   Cancer Father 63       Some GI cancer. Not sure if it was Colon Cancer or not.   Hypertension Father    Diabetes Maternal Grandmother     Social History   Socioeconomic History   Marital status: Single    Spouse name: Not on file   Number of children: Not on file   Years of education: 12   Highest education level: Not on file  Occupational History   Occupation: Not working  Tobacco Use   Smoking status: Former    Current packs/day: 0.00    Types: Cigarettes    Quit date: 02/18/2017    Years since quitting: 7.6   Smokeless tobacco: Never  Vaping Use   Vaping status: Never Used  Substance and Sexual Activity   Alcohol use: Yes    Comment: Beer- 40 oz. every day.   Drug use: Not Currently    Types: Cocaine, Marijuana   Sexual activity: Not on file  Other Topics Concern   Not on file  Social History Narrative   Lives in [place] for last 10 years with friend  Brady Church      Is not married and does not have kids.   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   Social Drivers of Corporate Investment Banker Strain: High Risk (10/02/2023)   Overall Financial Resource Strain (CARDIA)     Difficulty of Paying Living Expenses: Very hard  Food Insecurity: Food Insecurity Present (06/18/2024)   Hunger Vital Sign    Worried About Running Out of Food in the Last Year: Often true    Ran Out of Food in the Last Year: Often true  Transportation Needs: Unmet Transportation Needs (06/18/2024)   PRAPARE - Administrator, Civil Service (Medical): Yes    Lack of Transportation (Non-Medical): Yes  Physical Activity: Not on file  Stress: Not on file  Social Connections: Unknown (03/21/2024)   Social Connection and Isolation Panel    Frequency of Communication with Friends and Family: Never    Frequency of Social Gatherings with Friends and Family: Never    Attends Religious Services: Never    Database Administrator or Organizations: No    Attends Banker Meetings: Never    Marital Status: Not on file  Intimate Partner Violence: Not At Risk (06/18/2024)   Humiliation, Afraid, Rape, and Kick questionnaire    Fear of Current or Ex-Partner: No    Emotionally Abused: No    Physically Abused: No    Sexually Abused: No    Review of Systems: ROS negative except for what is noted on the assessment and plan.  There were no vitals filed for this visit.  Physical Exam  Physical Exam: Constitutional: well-appearing *** sitting in ***, in no acute distress HENT: normocephalic atraumatic, mucous membranes moist Eyes: conjunctiva non-erythematous Cardiovascular: regular rate and rhythm, no m/r/g Pulmonary/Chest: normal work of breathing on room air, lungs clear to auscultation bilaterally Abdominal: soft, non-tender, non-distended MSK: *** Neurological: alert & oriented x 3, 5/5 strength in bilateral upper and lower extremities, normal gait Skin: warm and dry Psych: ***  Assessment & Plan:   Assessment & Plan     No orders of the defined types were placed in this encounter.  PMH: HTN, T2DM uncontrolled with neuropathy, CVA, vitamin B12 deficiency, legally  blind, HLD, housing instability  *** Status: uncontrolled. Last A1c of 9.3 in 08/2024.  A1c today is NOT DUE.  Currently taking metformin  500 mg ***. Follows with podiatry, last OV 9/15. Hx of DKA 2016 - was on Synjardy . Discussed GLP-1 and will think about it and possibly coordinate with [facility] RN. ***   Plan -Titrate up metformin  to max dose as tolerated *** -Discussion on GLP-1 *** -A1c in 3 months -Ophthalmology exam: [facility]  -Urine ACR completed 02/2024 -  LDL 88 on 08/2024 after restarted atorvastatin  80 mg ***B12  HLD History of CVA On atorvastatin  80 mg daily.  ***Taking aspirin  81 mg.  LDL not at goal but improved to 88 after restarting statin.  Will need to add on Zetia 10 mg.***   Status: ***. BP today ***. Reports taking losartan  25 mg, spironolactone  12.5 mg daily.  Last BMP in 08/2024 with normalized Scr.  Plan -Continue: losartan  and spiro   No current phone. States plan to obtain phone next month possibly. Unhomed but goes to [facility]. Has seen [facility] RN. States emergency contacts listed in chart not able to reach him or see him.  ***  CARE: PCV  **Simbrinza  1gtt BID, Timolol  1 ggt BID, Rhopressa  1 gtt at bedtime, lumigan  1 gtt at bedtime    No follow-ups on file.   Patient {GC/GE:[id number]::discussed with,seen with} Dr. {WJFZD:[id number]::Tpoopjfd,Z. Hoffman,Winfrey,Narendra,Chun,Chambliss,Lau,Machen}  Ozell Nearing, D.O. [facility] Internal Medicine, PGY-3 Clinic Phone: [phone] Date 09/24/2024 Time 7:15 AM

## 2024-09-24 NOTE — Patient Instructions (Incomplete)
 Thank you, Mr.Robertson Selke for allowing us  to provide your care today. Today we discussed your blood pressure, diabetes, and cholesterol.    Referrals: -  New medications: -  I have ordered the following labs for you:  Lab Orders  No laboratory test(s) ordered today     I will call if any are abnormal. All of your labs can be accessed through My Chart.   My Chart Access: [email or web address]  Please follow-up in:    We look forward to seeing you next time. Please call our clinic at [phone] if you have any questions or concerns. The best time to call is Monday-Friday from 9am-4pm, but there is someone available 24/7. If after hours or the weekend, call the main hospital number and ask for the Internal Medicine Resident On-Call. If you need medication refills, please notify your pharmacy one week in advance and they will send us  a request.   Thank you for letting us  take part in your care. Wishing you the best!  Ranie Chinchilla, DO 09/24/2024, 1:42 PM [facility]

## 2024-09-25 ENCOUNTER — Ambulatory Visit: Payer: MEDICAID

## 2024-09-25 NOTE — Assessment & Plan Note (Addendum)
 Last lipid panel 10/1 showing total cholesterol 163 and LDL 88. Currently not at goal for diabetes, but improving from LDL  Currently taking ASA 81mg  due to prior history of CVA. On Atorvastatin  80mg  daily. Consider adding Zetia 10mg  to regimen to improve LDL  to goal of <70.

## 2024-09-25 NOTE — Assessment & Plan Note (Deleted)
 Chronic, ___. BP today ___. Regimen includes Losartan  25mg , Spironolactone  12.5mg  daily. Last BMP in 08/2024 with normalized Scr.   - Continue Losartan  25mg  and Spironolactone  12.5mg  daily - Recheck BMP in 3 months with close follow up after, as patient currently has no phone/home address (housing instability) and his emergency contacts cannot get in touch with him

## 2024-09-25 NOTE — Assessment & Plan Note (Deleted)
 Chronic, uncontrolled. Last A1c of 9.3% earlier in 08/2024. Current regimen includes Metformin  500 mg ___ (ask if uptitrated Metformin ). Follows with podiatry, last OV 9/15, due for foot exam in 1 year. Sees [facility], last visit 08/25/2024. No diabetic retinopathy. Encouraged to continue Simbrinza  1gtt BID, Timolol  1 ggt BID, Rhopressa  1 gtt at bedtime, lumigan  1 gtt at bedtime. Patient reports ___ of hypoglycemic episodes.  Discussed GLP-1 and will think about it and possibly coordinate with [facility] RN.  unsure of logistics ???   Plan -Titrate up metformin  to max dose as tolerated ?? -Discussion on GLP-1 ??? - Recheck A1c in 3 months -LDL 88 on 08/2024 after restarted atorvastatin  80 mg -- check? B12

## 2024-09-30 ENCOUNTER — Ambulatory Visit: Payer: MEDICAID | Admitting: Family Medicine

## 2024-09-30 LAB — GLUCOSE, POCT (MANUAL RESULT ENTRY): POC Glucose: 361 mg/dL — AB (ref 70–99)

## 2024-10-01 ENCOUNTER — Ambulatory Visit: Payer: MEDICAID

## 2024-10-02 ENCOUNTER — Ambulatory Visit: Payer: MEDICAID | Admitting: Student

## 2024-10-06 ENCOUNTER — Ambulatory Visit: Payer: Self-pay | Admitting: Student

## 2024-10-07 ENCOUNTER — Ambulatory Visit: Payer: MEDICAID | Admitting: Family Medicine

## 2024-10-07 ENCOUNTER — Ambulatory Visit: Payer: Self-pay | Admitting: Student

## 2024-10-07 LAB — GLUCOSE, POCT (MANUAL RESULT ENTRY): POC Glucose: 441 mg/dL — AB (ref 70–99)

## 2024-10-07 NOTE — Progress Notes (Signed)
 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  Chief Complaint: VS Check Living Situation[facility] Insurance Status:     Patient Status:  [x]  Returning patient to [facility]  Confirmed demographics and emergency contact info  HIPAA and privacy policies previously reviewed  Consent for digital charting: [x]  Previously Signed []  Signed Today []  Not Signed  Additional Notes:  Vital signs taken and entered in flowsheet  Interpreter services: []  Needed [x]  Not Needed  Brief SDOH update completed  Referral to provider: []  Needed [x]  Not Needed  RN Interventions Provided Today:  [x]  Health education (e.g., chronic disease, hygiene, nutrition)

## 2024-10-09 ENCOUNTER — Ambulatory Visit (INDEPENDENT_AMBULATORY_CARE_PROVIDER_SITE_OTHER): Payer: MEDICAID | Admitting: Podiatry

## 2024-10-09 ENCOUNTER — Encounter: Payer: Self-pay | Admitting: Podiatry

## 2024-10-09 DIAGNOSIS — E1136 Type 2 diabetes mellitus with diabetic cataract: Secondary | ICD-10-CM

## 2024-10-09 DIAGNOSIS — M2042 Other hammer toe(s) (acquired), left foot: Secondary | ICD-10-CM

## 2024-10-09 DIAGNOSIS — M2012 Hallux valgus (acquired), left foot: Secondary | ICD-10-CM

## 2024-10-09 NOTE — Progress Notes (Signed)
 This patient presents to the office with chief complaint painful second toe left foot.  He says it is painful walking and wearing his shoes.  This patient has history of DM and neuropathy.  He presents to the office for evaluation and treatment.  General Appearance  Alert, conversant and in no acute stress.  Vascular  Dorsalis pedis and posterior tibial  pulses are  weakly palpable  bilaterally.  Capillary return is within normal limits  bilaterally. Temperature is within normal limits  bilaterally.  Neurologic  Senn-Weinstein monofilament wire test within normal limits  bilaterally. Muscle power within normal limits bilaterally.  Nails Thick disfigured discolored nails with subungual debris  from hallux to fifth toes bilaterally. No evidence of bacterial infection or drainage bilaterally.  Orthopedic  No limitations of motion  feet .  No crepitus or effusions noted.  No bony pathology or digital deformities noted. HAV 1st MPJ left foot.  Hammer toe second toe left foot.  Skin  normotropic skin with no porokeratosis noted bilaterally.  No signs of infections or ulcers noted.     HAV left foot  Hammer toe second toe left foot.  ROV  Dispensed toe cap for second toe left foot to separate from deviated bunion.  RTC 3 months    Cordella Bold DPM

## 2024-10-13 ENCOUNTER — Ambulatory Visit: Payer: Self-pay

## 2024-10-13 NOTE — Telephone Encounter (Signed)
 Copied from CRM #[id number]. Topic: Clinical - Red Word Triage >> Oct 13, 2024  8:06 AM Alfonso ORN wrote: Red Word that prompted transfer to Nurse Triage: patient experiencing back pain rate 7, fluid in legs , and legs are tighten up , pt is legally blind and can't tell if legs are swollen   Call disconnected. Called back, voice mailbox not set up.

## 2024-10-13 NOTE — Telephone Encounter (Signed)
 Attempt #3 to reach patient- VM not set up so unable to leave message

## 2024-10-13 NOTE — Telephone Encounter (Signed)
 RTC to patient .  Unable to reach per phone or to leave a message. VM not set up.

## 2024-10-15 ENCOUNTER — Other Ambulatory Visit (HOSPITAL_COMMUNITY): Payer: Self-pay

## 2024-10-15 ENCOUNTER — Ambulatory Visit: Payer: MEDICAID

## 2024-10-15 ENCOUNTER — Other Ambulatory Visit: Payer: Self-pay

## 2024-10-15 VITALS — BP 127/81 | HR 80 | Temp 98.0°F | Ht 69.0 in | Wt 210.6 lb

## 2024-10-15 DIAGNOSIS — Z7984 Long term (current) use of oral hypoglycemic drugs: Secondary | ICD-10-CM

## 2024-10-15 DIAGNOSIS — Z79899 Other long term (current) drug therapy: Secondary | ICD-10-CM

## 2024-10-15 DIAGNOSIS — E1136 Type 2 diabetes mellitus with diabetic cataract: Secondary | ICD-10-CM | POA: Diagnosis not present

## 2024-10-15 DIAGNOSIS — Z59819 Housing instability, housed unspecified: Secondary | ICD-10-CM

## 2024-10-15 DIAGNOSIS — I5042 Chronic combined systolic (congestive) and diastolic (congestive) heart failure: Secondary | ICD-10-CM

## 2024-10-15 DIAGNOSIS — N529 Male erectile dysfunction, unspecified: Secondary | ICD-10-CM

## 2024-10-15 MED ORDER — SILDENAFIL CITRATE 50 MG PO TABS
25.0000 mg | ORAL_TABLET | Freq: Every day | ORAL | 3 refills | Status: AC | PRN
  Filled 2024-10-15: qty 10, 20d supply, fill #0

## 2024-10-15 MED ORDER — TIMOLOL MALEATE 0.25 % OP SOLN
1.0000 [drp] | Freq: Two times a day (BID) | OPHTHALMIC | 2 refills | Status: AC
  Filled 2024-10-15: qty 5, 50d supply, fill #0

## 2024-10-15 MED ORDER — SIMBRINZA 1-0.2 % OP SUSP
1.0000 [drp] | Freq: Two times a day (BID) | OPHTHALMIC | 2 refills | Status: AC
  Filled 2024-10-15: qty 8, 80d supply, fill #0

## 2024-10-15 MED ORDER — RHOPRESSA 0.02 % OP SOLN
1.0000 [drp] | Freq: Every day | OPHTHALMIC | 2 refills | Status: AC
  Filled 2024-10-15: qty 2.5, 50d supply, fill #0

## 2024-10-15 NOTE — Assessment & Plan Note (Signed)
 Patient last seen by Dr. Elicia on 08/27/2024 for diabetes follow-up.  A1c at that time was 9.3 which is increased from his prior A1c in 05/2024 of 8.2.  He was given ramp-up instructions of metformin  and reports moderate adherence to this and has been taking metformin  1000 mg in the morning and sometimes metformin  500 mg in the evening.  GLP-1 discussed with patient and he is still considering this.  Would consider if patient may be able to take SGLT2 inhibitor given his diabetes and chronic combined systolic and diastolic congestive heart failure.  Advised patient that in the next couple of weeks I would like for him to take metformin  1000 mg in the morning and metformin  1000 mg in the evening. - Increase to Metformin  1000 mg BID - Discussion of GLP-1 and SGLT2i at next office visit - f/u in 2 months for repeat A1C - Ophthalmology exam: [facility]  - Urine ACR completed 02/2024 - Continue Atorvastatin  80 mg daily

## 2024-10-15 NOTE — Assessment & Plan Note (Signed)
 Refilled Sildenafil  as needed. - Sildenafil  25 mg prn

## 2024-10-15 NOTE — Progress Notes (Addendum)
 Patient name: Brady Church Date of birth: 01/05/1961 Date of visit: 10/15/24  Type of visit: Acute Office Visit   Subjective   Chief concern:  Chief Complaint  Patient presents with   Acute Visit    PER E2C2 :  patient experiencing back pain rate 7, fluid in legs , and legs are tighten up , pt is legally blind and can't tell if legs are swollen       Brady Church is a 63 y.o. 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  Patient reports that for the past 2 weeks he has noted tightness of his shoes and thinks that he may have swelling on bilateral legs though he is legally blind and cannot tell if he is gathered fluid.  He denies shortness of breath or difficulty breathing, and denies orthopnea.  He reports that he is taking his prescribed medications and at his last visit in October with Dr. Elicia he was given ramp-up metformin  dosing.  He reports moderate adherence to this noting that he takes metformin  1000 mg every morning and occasionally takes metformin  500 mg in the evening.  ROS: Denies headaches, dizziness, fever, chills, runny nose, sore throat, vision changes, hearing changes, chest pain, shortness of breath, difficulty breathing, nausea, vomiting, abdominal pain. Denies increased urinary frequency, pain with urination, constipation or diarrhea. No recent falls.   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) 02/19/2017   Intracranial vascular stenosis 02/16/2017   Chronic combined systolic and diastolic congestive heart failure (HCC) 05/12/2015   Alcohol use disorder, severe, dependence (HCC) 11/06/2014   History of tobacco use 10/07/2014   Healthcare maintenance 01/23/2014   Glaucoma 05/11/2012   Hyperlipidemia 03/22/2012   Hypertension 01/17/2012   Diabetes mellitus with diabetic cataract (HCC) 03/08/2009     Past Surgical History:  Procedure Laterality Date   CATARACT EXTRACTION  01/08/2012   Right eye   EYE SURGERY     x 4 total      Current Outpatient Medications  Medication Instructions   aspirin  EC 81 mg, Oral, Daily, Swallow whole.   atorvastatin  (LIPITOR ) 80 mg, Oral, Daily-1800   bimatoprost  (LUMIGAN ) 0.01 % SOLN 1 drop, Right Eye, Every evening   Brinzolamide -Brimonidine  (SIMBRINZA ) 1-0.2 % SUSP 1 drop, Right Eye, 2 times daily   Brinzolamide -Brimonidine  (SIMBRINZA ) 1-0.2 % SUSP 1 drop, Right Eye, 2 times daily   folic acid  (FOLVITE ) 1 mg, Oral, Daily   losartan  (COZAAR ) 25 mg, Oral, Nightly   metFORMIN  (GLUCOPHAGE ) 500 MG tablet Take 1 tablet (500mg ) every morning for one week, then take 1 tablet (500 mg) every morning and 1 tablet (500 mg) at night for one week, then take 2 tablets (1,000 mg) every morning and 1  tablet (500 mg) every night for one week, then take 2 tablets (1,000 mg) every morning and 2 tablets (1,000 mg) every night.   Miconazole  Nitrate 2 % AERP Apply topically daily to feet daily before putting socks on   Netarsudil  Dimesylate (RHOPRESSA ) 0.02 % SOLN 1 drop, Right Eye, Every evening   Netarsudil  Dimesylate (RHOPRESSA ) 0.02 % SOLN 1 drop, Right Eye, Daily at bedtime   sildenafil  (VIAGRA ) 25 mg,  Oral, Daily PRN   SIMBRINZA  1-0.2 % SUSP 1 drop, Right Eye, 2 times daily   spironolactone  (ALDACTONE ) 12.5 mg, Oral, Daily   timolol  (TIMOPTIC ) 0.25 % ophthalmic solution 1 drop, Right Eye, 2 times daily   timolol  (TIMOPTIC ) 0.5 % ophthalmic solution 1 drop, Right Eye, 2 times daily   timolol  (TIMOPTIC ) 0.5 % ophthalmic solution 1 drop, Right Eye, 2 times daily    Social History   Tobacco Use   Smoking status: Former    Current packs/day: 0.00    Types: Cigarettes    Quit date: 02/18/2017    Years since quitting: 7.6   Smokeless tobacco: Never  Vaping Use   Vaping status: Never Used  Substance Use Topics   Alcohol use: Yes    Comment: Beer- 40 oz. every day.   Drug use: Not Currently    Types: Cocaine, Marijuana      Objective  Today's Vitals   10/15/24 1035  BP: 127/81  Pulse: 80  Temp: 98 F (36.7 C)  TempSrc: Oral  SpO2: 98%  Weight: 210 lb 9.6 oz (95.5 kg)  Height: 5' 9 (1.753 m)  PainSc: 6   Body mass index is 31.1 kg/m.   Physical Exam:   Constitutional: well-appearing male sitting in exam chair, in no acute distress HEENT: normocephalic atraumatic, mucous membranes moist Eyes: conjunctiva erythematous Cardiovascular: regular rate and rhythm, bilateral radial pulses 2+; no swelling or edema present on bilateral lower extremities  Pulmonary/Chest: normal work of breathing on room air, lungs clear to auscultation bilaterally Abdominal: soft, non-tender, non-distended MSK: normal bulk and tone. Neurological: alert & oriented x 3 Skin: warm and dry Psych: mood calm, behavior normal, thought content normal, judgement normal      The ASCVD Risk score (Arnett DK, et al., 2019) failed to calculate for the following reasons:   Risk score cannot be calculated because patient has a medical history suggesting prior/existing ASCVD      Assessment & Plan  Problem List Items Addressed This Visit       Cardiovascular and Mediastinum   Chronic combined  systolic and diastolic congestive heart failure (HCC) (Chronic)   Echo 10/2023 showed left ventricular EF of 30 to 35%.  Patient presents to clinic today with concerns that he has had edema of bilateral lower extremities, though he is legally blind and is unable to tell if he has gathered any fluid.  On physical exam there is no bilateral lower extremity edema present.  Lungs clear to auscultation bilaterally.  Do not think that right now he requires any diuretic.  I would like him to continue his losartan  25 mg daily and spironolactone  12.5 mg daily.  Ideally at his next visit we can discuss SGLT2i versus GLP-1.  - Continue Losartan  25 mg daily - Continue Spironolactone  12.5 mg daily - Discussion of SGLT2i vs. GLP-1 initiation at next visit  - Consider referral to Heart Failure Clinic in the future      Relevant Medications   sildenafil  (VIAGRA ) 50 MG tablet     Endocrine  Diabetes mellitus with diabetic cataract (HCC) (Chronic)   Patient last seen by Dr. Zheng on 08/27/2024 for diabetes follow-up.  A1c at that time was 9.3 which is increased from his prior A1c in 05/2024 of 8.2.  He was given ramp-up instructions of metformin  and reports moderate adherence to this and has been taking metformin  1000 mg in the morning and sometimes metformin  500 mg in the evening.  GLP-1 discussed with patient and he is still considering this.  Would consider if patient may be able to take SGLT2 inhibitor given his diabetes and chronic combined systolic and diastolic congestive heart failure.  Advised patient that in the next couple of weeks I would like for him to take metformin  1000 mg in the morning and metformin  1000 mg in the evening. - Increase to Metformin  1000 mg BID - Discussion of GLP-1 and SGLT2i at next office visit - f/u in 2 months for repeat A1C - Ophthalmology exam: [facility]  - Urine ACR completed 02/2024 - Continue Atorvastatin  80 mg daily         Other   Erectile dysfunction - Primary  (Chronic)   Refilled Sildenafil  as needed. - Sildenafil  25 mg prn       Housing instability (Chronic)   Patient given food today from [facility] pantry.        Return in about 2 months (around 12/15/2024) for A1C recheck.  Patient discussed with Dr. Jeanelle, who also saw and evaluated the patient.  Doyal Miyamoto, MD [facility]  PGY-1 10/15/2024, 1:20 PM

## 2024-10-15 NOTE — Assessment & Plan Note (Signed)
 Patient given food today from [facility] pantry.

## 2024-10-15 NOTE — Patient Instructions (Signed)
 Thank you, Mr.Brady Church for allowing us  to provide your care today. Today we discussed the following:  - Sent in a refill of your Viagra  - Please begin taking Metformin  1000 mg twice daily - You didn't have any fluid on your legs!    I have ordered the following medication/changed the following medications:   Stop the following medications: There are no discontinued medications.   Start the following medications: Meds ordered this encounter  Medications   sildenafil  (VIAGRA ) 50 MG tablet    Sig: Take 0.5 tablets (25 mg total) by mouth daily as needed for erectile dysfunction.    Dispense:  10 tablet    Refill:  3     Follow up: 2 months    Remember: Begin taking your Metformin  1000 mg twice daily.   Should you have any questions or concerns please call the Internal Medicine Clinic at [phone].     Doyal Miyamoto, MD [facility]

## 2024-10-15 NOTE — Assessment & Plan Note (Signed)
 Echo 10/2023 showed left ventricular EF of 30 to 35%.  Patient presents to clinic today with concerns that he has had edema of bilateral lower extremities, though he is legally blind and is unable to tell if he has gathered any fluid.  On physical exam there is no bilateral lower extremity edema present.  Lungs clear to auscultation bilaterally.  Do not think that right now he requires any diuretic.  I would like him to continue his losartan  25 mg daily and spironolactone  12.5 mg daily.  Ideally at his next visit we can discuss SGLT2i versus GLP-1.  - Continue Losartan  25 mg daily - Continue Spironolactone  12.5 mg daily - Discussion of SGLT2i vs. GLP-1 initiation at next visit  - Consider referral to Heart Failure Clinic in the future

## 2024-10-16 ENCOUNTER — Other Ambulatory Visit (HOSPITAL_COMMUNITY): Payer: Self-pay

## 2024-10-17 NOTE — Progress Notes (Signed)
 Internal Medicine Clinic Attending  I was physically present during the key portions of the resident provided service and participated in the medical decision making of patient's management care. I reviewed pertinent patient test results.  The assessment, diagnosis, and plan were formulated together and I agree with the documentation in the resident's note.  Jeanelle Layman CROME, MD

## 2024-10-21 ENCOUNTER — Ambulatory Visit: Payer: MEDICAID | Admitting: Family Medicine

## 2024-10-21 VITALS — BP 146/82 | HR 87 | Temp 97.8°F | Resp 18 | Wt 209.0 lb

## 2024-10-21 DIAGNOSIS — Z131 Encounter for screening for diabetes mellitus: Secondary | ICD-10-CM

## 2024-10-24 ENCOUNTER — Other Ambulatory Visit: Payer: Self-pay

## 2024-10-27 ENCOUNTER — Other Ambulatory Visit (HOSPITAL_COMMUNITY): Payer: Self-pay

## 2024-10-28 ENCOUNTER — Other Ambulatory Visit (HOSPITAL_COMMUNITY): Payer: Self-pay

## 2024-10-28 ENCOUNTER — Ambulatory Visit: Payer: MEDICAID | Admitting: Family Medicine

## 2024-10-28 LAB — GLUCOSE, POCT (MANUAL RESULT ENTRY): POC Glucose: 468 mg/dL — AB (ref 70–99)

## 2024-10-28 NOTE — Progress Notes (Signed)
 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  Chief Complaint: BP check  Living Situation: Un housed Insurance Status:  Medicaid   Patient Status:  [x]  Returning patient to [employer]  Confirmed demographics and emergency contact info  HIPAA and privacy policies previously reviewed  Consent for digital charting: [x]  Previously Signed []  Signed Today []  Not Signed  Additional Notes:  Vital signs taken and entered in flowsheet  Interpreter services: []  Needed [x]  Not Needed  Brief SDOH update completed  Referral to provider: []  Needed [x]  Not Needed  RN Interventions Provided Today:  [x]  Health education (e.g., chronic disease, hygiene, nutrition)   [x]  Other: Pt has not taken meds today. Counseled on importance of Metformin  compliance.

## 2024-11-04 ENCOUNTER — Ambulatory Visit: Payer: MEDICAID | Admitting: Family Medicine

## 2024-11-04 VITALS — BP 140/62 | HR 77 | Temp 97.4°F | Resp 16 | Wt 203.5 lb

## 2024-11-04 DIAGNOSIS — E1165 Type 2 diabetes mellitus with hyperglycemia: Secondary | ICD-10-CM

## 2024-11-04 DIAGNOSIS — R739 Hyperglycemia, unspecified: Secondary | ICD-10-CM

## 2024-11-04 NOTE — Progress Notes (Unsigned)
 er NP Raymon Eagles, PhD, NP)   Acute Office Visit   Subjective:    Subjective Patient ID: Brady Church, male    DOB: Aug 04, 1961, 63 y.o.   MRN: [id number]       Chief Complaint  Patient presents with   Diabetes      Elevated BS 588 ate a pancake, syrup, eggs, one link sausage, 2 cups of coffee with sugar. Water, increase in thirst and urination.no sweet smell to urine or breath.  Reports urinating a gallon a day, increase urination.  Did not take metformin  last night or the day before.  Discussed he used to be on insulin  in 2018 and it worked, after discussing a GLP 1 he is interested and wants to start.  [facility] will assist in administering shot weekly.  He will bring to unit, until he is comfy       Diabetes Associated symptoms include blurred vision and weight loss.   Patient is in today for ***   Review of Systems  Constitutional:  Positive for weight loss. Negative for chills and fever.  HENT: Negative.    Eyes:  Positive for blurred vision.       Legally blind   Respiratory:  Positive for cough.   Cardiovascular: Negative.   Gastrointestinal: Negative.   Genitourinary:  Positive for frequency.  Musculoskeletal: Negative.   Skin: Negative.   Neurological: Negative.   Endo/Heme/Allergies: Negative.   Psychiatric/Behavioral: Negative.    All other systems reviewed and are negative.          Objective:    Objective BP (!) 140/62 (BP Location: Left Arm, Patient Position: Sitting, Cuff Size: Normal)   Pulse 77   Temp (!) 97.4 F (36.3 C) (Temporal)   Resp 16   Wt 203 lb 8 oz (92.3 kg)   SpO2 98%   BMI 30.05 kg/m  {Vitals History (Optional):23777}   Physical Exam Vitals and nursing note reviewed.  Constitutional:      Appearance: Normal appearance. He is normal weight.  Cardiovascular:     Rate and Rhythm: Normal rate and regular rhythm.     Pulses: Normal pulses.     Heart sounds: Normal heart sounds.  Neurological:     Mental  Status: He is alert.       No results found for any visits on 11/04/24.   Blood Glucose: 588 Recheck: after metformin  ________     Assessment & Plan:    Problem List Items Addressed This Visit   None Visit Diagnoses         Hyperglycemia    -  Primary      Type 2 diabetes mellitus with hyperglycemia, without long-term current use of insulin  (HCC)               His medication is across the street. He will go take and come back in 30 min for recheck CBG>  He dis drindk 16 0unces of water on unit.  Discussed GLP 1 with patient he would like for his PCP to go ahead send in to pharmacy.  He will pick it up and bring back for us  to administer and educate on dose. Patient is blind and will need teaching assistance.

## 2024-11-04 NOTE — Progress Notes (Unsigned)
 Per NP Raymon Eagles, PhD, NP)  Acute Office Visit  Subjective:     Patient ID: Brady Church, male    DOB: [birth year], 63 y.o.   MRN: [id number]  Chief Complaint  Patient presents with   Diabetes    Elevated BS 588 ate a pancake, syrup, eggs, one link sausage, 2 cups of coffee with sugar. Water, increase in thirst and urination.no sweet smell to urine or breath.  Reports urinating a gallon a day, increase urination.  Did not take metformin  last night or the day before.  Discussed he used to be on insulin  in 2018 and it worked, after discussing a GLP 1 he is interested and wants to start.  [facility] will assist in administering shot weekly.  He will bring to unit, until he is comfy     Diabetes Associated symptoms include blurred vision and weight loss.   Patient is in today for ***  Review of Systems  Constitutional:  Positive for weight loss. Negative for chills and fever.  HENT: Negative.    Eyes:  Positive for blurred vision.       Legally blind   Respiratory:  Positive for cough.   Cardiovascular: Negative.   Gastrointestinal: Negative.   Genitourinary:  Positive for frequency.  Musculoskeletal: Negative.   Skin: Negative.   Neurological: Negative.   Endo/Heme/Allergies: Negative.   Psychiatric/Behavioral: Negative.    All other systems reviewed and are negative.       Objective:    BP (!) 140/62 (BP Location: Left Arm, Patient Position: Sitting, Cuff Size: Normal)   Pulse 77   Temp (!) 97.4 F (36.3 C) (Temporal)   Resp 16   Wt 203 lb 8 oz (92.3 kg)   SpO2 98%   BMI 30.05 kg/m  {Vitals History (Optional):23777}  Physical Exam Vitals and nursing note reviewed.  Constitutional:      Appearance: Normal appearance. He is normal weight.  Cardiovascular:     Rate and Rhythm: Normal rate and regular rhythm.     Pulses: Normal pulses.     Heart sounds: Normal heart sounds.  Neurological:     Mental Status: He is alert.     No results  found for any visits on 11/04/24.  Blood Glucose: 588 Recheck: after metformin  ________    Assessment & Plan:   Problem List Items Addressed This Visit   None Visit Diagnoses       Hyperglycemia    -  Primary     Type 2 diabetes mellitus with hyperglycemia, without long-term current use of insulin  (HCC)           His medication is across the street. He will go take and come back in 30 min for recheck CBG>  He dis drindk 16 0unces of water on unit.  Discussed GLP 1 with patient he would like for his PCP to go ahead send in to pharmacy.  He will pick it up and bring back for us  to administer and educate on dose. Patient is blind and will need teaching assistance.    No orders of the defined types were placed in this encounter.   No follow-ups on file.  Nainoa Woldt C Daimen Shovlin, RN

## 2024-11-06 ENCOUNTER — Encounter: Payer: Self-pay | Admitting: Family Medicine

## 2024-11-06 NOTE — Progress Notes (Signed)
 NP Raymon Eagles, PhD, NP)   Acute Office Visit   Subjective:    Subjective Patient ID: Brady Church, male    DOB: [birth year], 63 y.o.   MRN: [id number]       Chief Complaint  Patient presents with   Diabetes      Elevated BS 588 ate a pancake, syrup, eggs, one link sausage, 2 cups of coffee with sugar. Water, increase in thirst and urination.no sweet smell to urine or breath.  Reports urinating a gallon a day, increase urination.  Did not take metformin  last night or the day before.  Discussed he used to be on insulin  in 2018 and it worked, after discussing a GLP 1 he is interested and wants to start.  [facility] will assist in administering shot weekly.  He will bring to unit, until he is comfy       Diabetes Associated symptoms include blurred vision and weight loss.   Patient is in today for  Chief Complaint  Patient presents with   Diabetes      Review of Systems  Constitutional:  Positive for weight loss. Negative for chills and fever.  HENT: Negative.    Eyes:  Positive for blurred vision.       Legally blind   Respiratory:  Positive for cough.   Cardiovascular: Negative.   Gastrointestinal: Negative.   Genitourinary:  Positive for frequency.  Musculoskeletal: Negative.   Skin: Negative.   Neurological: Negative.   Endo/Heme/Allergies: Negative.   Psychiatric/Behavioral: Negative.    All other systems reviewed and are negative.          Objective:    Objective BP (!) 140/62 (BP Location: Left Arm, Patient Position: Sitting, Cuff Size: Normal)   Pulse 77   Temp (!) 97.4 F (36.3 C) (Temporal)   Resp 16   Wt 203 lb 8 oz (92.3 kg)   SpO2 98%   BMI 30.05 kg/m     Physical Exam Vitals and nursing note reviewed.  Constitutional:      Appearance: Normal appearance. He is normal weight.  Cardiovascular:     Rate and Rhythm: Normal rate and regular rhythm.     Pulses: Normal pulses.     Heart sounds: Normal heart sounds.   Neurological:     Mental Status: He is alert.       No results found for any visits on 11/04/24.   Blood Glucose: 588 Recheck: after metformin  ______did not come back __     Assessment & Plan:    Problem List Items Addressed This Visit   None Visit Diagnoses         Hyperglycemia    -  Primary      Type 2 diabetes mellitus with hyperglycemia, without long-term current use of insulin  (HCC)               His medication is across the street. He will go take and come back in 30 min for recheck CBG>  He dis drindk 16 0unces of water on unit.  Discussed GLP 1 with patient he would like for his PCP to go ahead send in to pharmacy.  He will pick it up and bring back for us  to administer and educate on dose. Patient is blind and will need teaching assistance.    PLAN: Type 2 Diabetes Mellitus Uncontrolled   Medications:  [x]  Continue / adjust current meds  [x]  Metformin  [phone] mg BID  [x]  Add GLP-1 RA or SGLT2i (  if indicated by A1c, CV/renal benefit) PER PCP  [x]  Reinforce med adherence  Labs Ordered:  []  A1c  []  CMP (renal function)  []  Lipid panel  []  UACR (annually)  Monitoring:  [x]  SMBG: Target FBG 80-130 mg/dL  []  Recheck A1c in 3 months  [x]  Encourage log of readings  Lifestyle Counseling:  [x]  Diet: ADA low-carb, portion control  []  Exercise: 150 min/week  []  Weight goals discussed  Education:  [x]  Hypo/hyperglycemia signs reviewed  [x]  Sick day management  [x]  Foot care emphasized  []  Referral to DSME if needed  Follow-Up:  []  RTC in 3 months or sooner if uncontrolled  [x]  Refer to Endocrinology if A1c >10% despite therapy

## 2024-11-15 ENCOUNTER — Emergency Department (HOSPITAL_COMMUNITY): Payer: MEDICAID

## 2024-11-15 ENCOUNTER — Other Ambulatory Visit (HOSPITAL_COMMUNITY): Payer: Self-pay

## 2024-11-15 ENCOUNTER — Emergency Department (HOSPITAL_COMMUNITY)
Admission: EM | Admit: 2024-11-15 | Discharge: 2024-11-16 | Disposition: A | Payer: MEDICAID | Attending: Emergency Medicine | Admitting: Emergency Medicine

## 2024-11-15 ENCOUNTER — Other Ambulatory Visit: Payer: Self-pay

## 2024-11-15 ENCOUNTER — Emergency Department (HOSPITAL_COMMUNITY)
Admission: EM | Admit: 2024-11-15 | Discharge: 2024-11-15 | Disposition: A | Discharge status: Home / Self Care | Payer: MEDICAID | Attending: Emergency Medicine | Admitting: Emergency Medicine

## 2024-11-15 DIAGNOSIS — I110 Hypertensive heart disease with heart failure: Secondary | ICD-10-CM | POA: Insufficient documentation

## 2024-11-15 DIAGNOSIS — Z79899 Other long term (current) drug therapy: Secondary | ICD-10-CM | POA: Diagnosis not present

## 2024-11-15 DIAGNOSIS — Z7984 Long term (current) use of oral hypoglycemic drugs: Secondary | ICD-10-CM | POA: Diagnosis not present

## 2024-11-15 DIAGNOSIS — S0232XA Fracture of orbital floor, left side, initial encounter for closed fracture: Secondary | ICD-10-CM | POA: Insufficient documentation

## 2024-11-15 DIAGNOSIS — Z7982 Long term (current) use of aspirin: Secondary | ICD-10-CM | POA: Insufficient documentation

## 2024-11-15 DIAGNOSIS — I10 Essential (primary) hypertension: Secondary | ICD-10-CM

## 2024-11-15 DIAGNOSIS — S0012XA Contusion of left eyelid and periocular area, initial encounter: Secondary | ICD-10-CM | POA: Diagnosis not present

## 2024-11-15 DIAGNOSIS — E1165 Type 2 diabetes mellitus with hyperglycemia: Secondary | ICD-10-CM | POA: Diagnosis not present

## 2024-11-15 DIAGNOSIS — R220 Localized swelling, mass and lump, head: Secondary | ICD-10-CM | POA: Diagnosis present

## 2024-11-15 DIAGNOSIS — S0011XA Contusion of right eyelid and periocular area, initial encounter: Secondary | ICD-10-CM | POA: Insufficient documentation

## 2024-11-15 DIAGNOSIS — S0990XA Unspecified injury of head, initial encounter: Secondary | ICD-10-CM | POA: Diagnosis present

## 2024-11-15 DIAGNOSIS — S12001A Unspecified nondisplaced fracture of first cervical vertebra, initial encounter for closed fracture: Secondary | ICD-10-CM | POA: Insufficient documentation

## 2024-11-15 DIAGNOSIS — S0285XA Fracture of orbit, unspecified, initial encounter for closed fracture: Secondary | ICD-10-CM

## 2024-11-15 DIAGNOSIS — I509 Heart failure, unspecified: Secondary | ICD-10-CM | POA: Insufficient documentation

## 2024-11-15 DIAGNOSIS — Z5900 Homelessness unspecified: Secondary | ICD-10-CM | POA: Diagnosis not present

## 2024-11-15 DIAGNOSIS — H1133 Conjunctival hemorrhage, bilateral: Secondary | ICD-10-CM | POA: Insufficient documentation

## 2024-11-15 DIAGNOSIS — S0083XA Contusion of other part of head, initial encounter: Secondary | ICD-10-CM

## 2024-11-15 LAB — I-STAT CHEM 8, ED
BUN: 8 mg/dL (ref 8–23)
BUN: 15 mg/dL (ref 8–23)
Calcium, Ion: 1.04 mmol/L — ABNORMAL LOW (ref 1.15–1.40)
Calcium, Ion: 0.98 mmol/L — ABNORMAL LOW (ref 1.15–1.40)
Chloride: 94 mmol/L — ABNORMAL LOW (ref 98–111)
Chloride: 97 mmol/L — ABNORMAL LOW (ref 98–111)
Creatinine, Ser: 0.8 mg/dL (ref 0.61–1.24)
Creatinine, Ser: 1 mg/dL (ref 0.61–1.24)
Glucose, Bld: 302 mg/dL — ABNORMAL HIGH (ref 70–99)
Glucose, Bld: 316 mg/dL — ABNORMAL HIGH (ref 70–99)
HCT: 51 % (ref 39.0–52.0)
HCT: 50 % (ref 39.0–52.0)
Hemoglobin: 17.3 g/dL — ABNORMAL HIGH (ref 13.0–17.0)
Hemoglobin: 17 g/dL (ref 13.0–17.0)
Potassium: 3.9 mmol/L (ref 3.5–5.1)
Potassium: 5.6 mmol/L — ABNORMAL HIGH (ref 3.5–5.1)
Sodium: 135 mmol/L (ref 135–145)
Sodium: 134 mmol/L — ABNORMAL LOW (ref 135–145)
TCO2: 25 mmol/L (ref 22–32)
TCO2: 25 mmol/L (ref 22–32)

## 2024-11-15 LAB — I-STAT CG4 LACTIC ACID, ED
Lactic Acid, Venous: 3.9 mmol/L (ref 0.5–1.9)
Lactic Acid, Venous: 3.1 mmol/L (ref 0.5–1.9)

## 2024-11-15 LAB — BASIC METABOLIC PANEL WITH GFR
Anion gap: 16 — ABNORMAL HIGH (ref 5–15)
BUN: 12 mg/dL (ref 8–23)
CO2: 22 mmol/L (ref 22–32)
Calcium: 8.9 mg/dL (ref 8.9–10.3)
Chloride: 96 mmol/L — ABNORMAL LOW (ref 98–111)
Creatinine, Ser: 0.99 mg/dL (ref 0.61–1.24)
GFR, Estimated: >60 mL/min
Glucose, Bld: 285 mg/dL — ABNORMAL HIGH (ref 70–99)
Potassium: 4 mmol/L (ref 3.5–5.1)
Sodium: 134 mmol/L — ABNORMAL LOW (ref 135–145)

## 2024-11-15 LAB — CBC WITH DIFFERENTIAL/PLATELET
Abs Immature Granulocytes: 0.04 K/uL (ref 0.00–0.07)
Basophils Absolute: 0 K/uL (ref 0.0–0.1)
Basophils Relative: 0 %
Eosinophils Absolute: 0 K/uL (ref 0.0–0.5)
Eosinophils Relative: 0 %
HCT: 46.6 % (ref 39.0–52.0)
Hemoglobin: 16.5 g/dL (ref 13.0–17.0)
Immature Granulocytes: 0 %
Lymphocytes Relative: 10 %
Lymphs Abs: 1.1 K/uL (ref 0.7–4.0)
MCH: 32.5 pg (ref 26.0–34.0)
MCHC: 35.4 g/dL (ref 30.0–36.0)
MCV: 91.7 fL (ref 80.0–100.0)
Monocytes Absolute: 0.9 K/uL (ref 0.1–1.0)
Monocytes Relative: 8 %
Neutro Abs: 8.9 K/uL — ABNORMAL HIGH (ref 1.7–7.7)
Neutrophils Relative %: 82 %
Platelets: 258 K/uL (ref 150–400)
RBC: 5.08 MIL/uL (ref 4.22–5.81)
RDW: 13.2 % (ref 11.5–15.5)
WBC: 11 K/uL — ABNORMAL HIGH (ref 4.0–10.5)
nRBC: 0 % (ref 0.0–0.2)

## 2024-11-15 LAB — ETHANOL: Alcohol, Ethyl (B): <15 mg/dL

## 2024-11-15 MED ORDER — NAPROXEN 375 MG PO TABS
375.0000 mg | ORAL_TABLET | Freq: Two times a day (BID) | ORAL | 0 refills | Status: AC
  Filled 2024-11-15: qty 20, 10d supply, fill #0

## 2024-11-15 MED ORDER — SODIUM CHLORIDE 0.9 % IV BOLUS
1000.0000 mL | Freq: Once | INTRAVENOUS | Status: AC
  Administered 2024-11-15: 1000 mL via INTRAVENOUS

## 2024-11-15 MED ORDER — LIDOCAINE-EPINEPHRINE 1 %-1:100000 IJ SOLN
10.0000 mL | Freq: Once | INTRAMUSCULAR | Status: AC
  Administered 2024-11-15: 10 mL
  Filled 2024-11-15: qty 1

## 2024-11-15 MED ORDER — NAPROXEN 250 MG PO TABS
375.0000 mg | ORAL_TABLET | Freq: Once | ORAL | Status: AC
  Administered 2024-11-15: 375 mg via ORAL
  Filled 2024-11-15: qty 2

## 2024-11-15 NOTE — Discharge Instructions (Addendum)
 Take the medications as needed for pain.  Apply ice to help with the swelling.  Follow-up with your primary care doctor to check on your blood pressure and diabetes
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
## 2024-11-15 NOTE — Discharge Instructions (Addendum)
 Return for any problem.  ?
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
## 2024-11-15 NOTE — ED Notes (Signed)
 Notified RN over phone of critically high istat lactic acid. KIT
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
## 2024-11-16 NOTE — Progress Notes (Signed)
 CSW has added shelter resources. No other ICM needs at this time.    [place] Monicka Cyran LCSW-A   11/16/2024 8:53 AM

## 2024-11-17 ENCOUNTER — Other Ambulatory Visit (HOSPITAL_COMMUNITY): Payer: Self-pay

## 2024-11-17 ENCOUNTER — Telehealth (INDEPENDENT_AMBULATORY_CARE_PROVIDER_SITE_OTHER): Payer: Self-pay | Admitting: Physician Assistant

## 2024-11-17 NOTE — Telephone Encounter (Signed)
 Tried to call patient to schedule ED Follow-up with Chyrl around 12/04/2024 - call was not answered and voice mail not available.

## 2024-11-28 ENCOUNTER — Other Ambulatory Visit (HOSPITAL_COMMUNITY): Payer: Self-pay

## 2024-12-15 ENCOUNTER — Ambulatory Visit: Payer: MEDICAID

## 2024-12-16 ENCOUNTER — Ambulatory Visit: Payer: MEDICAID | Admitting: Family Medicine

## 2024-12-16 ENCOUNTER — Encounter: Payer: Self-pay | Admitting: Family Medicine

## 2024-12-16 VITALS — BP 148/94 | HR 88 | Temp 98.4°F | Resp 16 | Wt 196.0 lb

## 2024-12-16 DIAGNOSIS — E1100 Type 2 diabetes mellitus with hyperosmolarity without nonketotic hyperglycemic-hyperosmolar coma (NKHHC): Secondary | ICD-10-CM

## 2024-12-16 DIAGNOSIS — Z4802 Encounter for removal of sutures: Secondary | ICD-10-CM

## 2024-12-16 DIAGNOSIS — E1165 Type 2 diabetes mellitus with hyperglycemia: Secondary | ICD-10-CM

## 2024-12-16 LAB — GLUCOSE, POCT (MANUAL RESULT ENTRY): POC Glucose: 544 mg/dL — AB (ref 70–99)
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
## 2024-12-16 NOTE — Patient Instructions (Signed)
 appointment

## 2024-12-16 NOTE — Progress Notes (Signed)
 Nursing Intake Note - Returning Patient  [facility]er, [facility]  Chief Complaint: Suture removal Living Situation:  Insurance Status:   Hospital Account   Not on file      Patient Status:  [x]  Returning patient to [facility]  Confirmed demographics and emergency contact info  HIPAA and privacy policies previously reviewed  Consent for digital charting: [x]  Previously Signed []  Signed Today []  Not Signed  Additional Notes:  Vital signs taken and entered in flowsheet  Interpreter services: []  Needed [x]  Not Needed  Brief SDOH update completed  Referral to provider: []  Needed [x]  Not Needed  RN Interventions Provided Today:  []  Health education (e.g., chronic disease, hygiene, nutrition)  []  Wound care performed  []  Flu vaccine administered  [x]  Other: Suture removal by NP.

## 2024-12-17 ENCOUNTER — Inpatient Hospital Stay: Payer: Self-pay | Admitting: Student

## 2024-12-17 NOTE — Progress Notes (Unsigned)
" ° °  Established Patient Office Visit  Subjective   Patient ID: Brady Church, male    DOB: Jun 20, 1961  Age: 64 y.o. MRN: [id number]  No chief complaint on file.   Brady Church is a 64 y.o. who presents to the clinic for ***. Please see problem based assessment and plan for additional details.   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 BP Readings from Last 3 Encounters:  12/16/24 (!) 148/94  11/16/24 (!) 148/102  11/15/24 (!) 155/102   Wt Readings from Last 3 Encounters:  12/16/24 196 lb (88.9 kg)  11/04/24 203 lb 8 oz (92.3 kg)  10/28/24 207 lb (93.9 kg)      Physical Exam   No results found for any visits on 12/17/24.  Last metabolic panel Lab Results  Component Value Date   GLUCOSE 285 (H) 11/15/2024   NA 134 (L) 11/15/2024   K 4.0 11/15/2024   CL 96 (L) 11/15/2024   CO2 22 11/15/2024   BUN 12 11/15/2024   CREATININE 0.99 11/15/2024    GFRNONAA >60 11/15/2024   CALCIUM  8.9 11/15/2024   PHOS 2.5 04/19/2015   PROT 6.9 08/27/2024   ALBUMIN 4.1 08/27/2024   LABGLOB 2.8 08/27/2024   AGRATIO 1.5 07/05/2022   BILITOT 0.6 08/27/2024   ALKPHOS 79 08/27/2024   AST 29 08/27/2024   ALT 27 08/27/2024   ANIONGAP 16 (H) 11/15/2024   Last lipids Lab Results  Component Value Date   CHOL 163 08/27/2024   HDL 61 08/27/2024   LDLCALC 88 08/27/2024   TRIG 76 08/27/2024   CHOLHDL 2.7 08/27/2024   Last hemoglobin A1c Lab Results  Component Value Date   HGBA1C 9.3 (A) 08/27/2024      The ASCVD Risk score (Arnett DK, et al., 2019) failed to calculate for the following reasons:   Risk score cannot be calculated because patient has a medical history suggesting prior/existing ASCVD   * - Cholesterol units were assumed    Assessment & Plan:   Problem List Items Addressed This Visit   None   No follow-ups on file.    Damien Lease, DO  "

## 2024-12-19 ENCOUNTER — Other Ambulatory Visit (HOSPITAL_COMMUNITY): Payer: Self-pay

## 2024-12-19 ENCOUNTER — Ambulatory Visit (HOSPITAL_COMMUNITY)
Admission: EM | Admit: 2024-12-19 | Discharge: 2024-12-19 | Disposition: A | Discharge status: Other Acute Inpatient Hospital | Payer: MEDICAID | Attending: Nurse Practitioner | Admitting: Nurse Practitioner

## 2024-12-19 ENCOUNTER — Emergency Department (HOSPITAL_COMMUNITY): Payer: MEDICAID

## 2024-12-19 ENCOUNTER — Encounter (HOSPITAL_COMMUNITY): Payer: Self-pay

## 2024-12-19 ENCOUNTER — Emergency Department (HOSPITAL_COMMUNITY)
Admission: EM | Admit: 2024-12-19 | Discharge: 2024-12-19 | Disposition: A | Discharge status: Home / Self Care | Payer: MEDICAID | Attending: Emergency Medicine | Admitting: Emergency Medicine

## 2024-12-19 DIAGNOSIS — Z5900 Homelessness unspecified: Secondary | ICD-10-CM | POA: Insufficient documentation

## 2024-12-19 DIAGNOSIS — F1420 Cocaine dependence, uncomplicated: Secondary | ICD-10-CM

## 2024-12-19 DIAGNOSIS — F1090 Alcohol use, unspecified, uncomplicated: Secondary | ICD-10-CM

## 2024-12-19 DIAGNOSIS — F1910 Other psychoactive substance abuse, uncomplicated: Secondary | ICD-10-CM | POA: Diagnosis not present

## 2024-12-19 DIAGNOSIS — Z7984 Long term (current) use of oral hypoglycemic drugs: Secondary | ICD-10-CM | POA: Diagnosis not present

## 2024-12-19 DIAGNOSIS — E1165 Type 2 diabetes mellitus with hyperglycemia: Secondary | ICD-10-CM | POA: Diagnosis not present

## 2024-12-19 DIAGNOSIS — F1010 Alcohol abuse, uncomplicated: Secondary | ICD-10-CM | POA: Insufficient documentation

## 2024-12-19 DIAGNOSIS — R55 Syncope and collapse: Secondary | ICD-10-CM | POA: Diagnosis present

## 2024-12-19 DIAGNOSIS — E1136 Type 2 diabetes mellitus with diabetic cataract: Secondary | ICD-10-CM

## 2024-12-19 DIAGNOSIS — Z7982 Long term (current) use of aspirin: Secondary | ICD-10-CM | POA: Insufficient documentation

## 2024-12-19 DIAGNOSIS — E860 Dehydration: Secondary | ICD-10-CM

## 2024-12-19 DIAGNOSIS — R739 Hyperglycemia, unspecified: Secondary | ICD-10-CM

## 2024-12-19 DIAGNOSIS — F1421 Cocaine dependence, in remission: Secondary | ICD-10-CM | POA: Insufficient documentation

## 2024-12-19 LAB — I-STAT VENOUS BLOOD GAS, ED
Acid-Base Excess: 4 mmol/L — ABNORMAL HIGH (ref 0.0–2.0)
Bicarbonate: 29.4 mmol/L — ABNORMAL HIGH (ref 20.0–28.0)
Calcium, Ion: 0.97 mmol/L — ABNORMAL LOW (ref 1.15–1.40)
HCT: 44 % (ref 39.0–52.0)
Hemoglobin: 15 g/dL (ref 13.0–17.0)
O2 Saturation: 99 %
Potassium: 7 mmol/L (ref 3.5–5.1)
Sodium: 131 mmol/L — ABNORMAL LOW (ref 135–145)
TCO2: 31 mmol/L (ref 22–32)
pCO2, Ven: 43.9 mmHg — ABNORMAL LOW (ref 44–60)
pH, Ven: 7.434 — ABNORMAL HIGH (ref 7.25–7.43)
pO2, Ven: 129 mmHg — ABNORMAL HIGH (ref 32–45)

## 2024-12-19 LAB — I-STAT CHEM 8, ED
BUN: 16 mg/dL (ref 8–23)
Calcium, Ion: 0.97 mmol/L — ABNORMAL LOW (ref 1.15–1.40)
Chloride: 99 mmol/L (ref 98–111)
Creatinine, Ser: 0.9 mg/dL (ref 0.61–1.24)
Glucose, Bld: 395 mg/dL — ABNORMAL HIGH (ref 70–99)
HCT: 44 % (ref 39.0–52.0)
Hemoglobin: 15 g/dL (ref 13.0–17.0)
Potassium: 7 mmol/L (ref 3.5–5.1)
Sodium: 132 mmol/L — ABNORMAL LOW (ref 135–145)
TCO2: 28 mmol/L (ref 22–32)

## 2024-12-19 LAB — CBC WITH DIFFERENTIAL/PLATELET
Abs Immature Granulocytes: 0.01 K/uL (ref 0.00–0.07)
Basophils Absolute: 0 K/uL (ref 0.0–0.1)
Basophils Relative: 1 %
Eosinophils Absolute: 0.1 K/uL (ref 0.0–0.5)
Eosinophils Relative: 1 %
HCT: 41.9 % (ref 39.0–52.0)
Hemoglobin: 14.6 g/dL (ref 13.0–17.0)
Immature Granulocytes: 0 %
Lymphocytes Relative: 27 %
Lymphs Abs: 1.3 K/uL (ref 0.7–4.0)
MCH: 31.9 pg (ref 26.0–34.0)
MCHC: 34.8 g/dL (ref 30.0–36.0)
MCV: 91.7 fL (ref 80.0–100.0)
Monocytes Absolute: 0.4 K/uL (ref 0.1–1.0)
Monocytes Relative: 9 %
Neutro Abs: 3 K/uL (ref 1.7–7.7)
Neutrophils Relative %: 62 %
Platelets: 285 K/uL (ref 150–400)
RBC: 4.57 MIL/uL (ref 4.22–5.81)
RDW: 13.8 % (ref 11.5–15.5)
WBC: 4.7 K/uL (ref 4.0–10.5)
nRBC: 0 % (ref 0.0–0.2)

## 2024-12-19 LAB — COMPREHENSIVE METABOLIC PANEL WITH GFR
ALT: 15 U/L (ref 0–44)
AST: 30 U/L (ref 15–41)
Albumin: 4.2 g/dL (ref 3.5–5.0)
Alkaline Phosphatase: 105 U/L (ref 38–126)
Anion gap: 14 (ref 5–15)
BUN: 11 mg/dL (ref 8–23)
CO2: 24 mmol/L (ref 22–32)
Calcium: 9.6 mg/dL (ref 8.9–10.3)
Chloride: 97 mmol/L — ABNORMAL LOW (ref 98–111)
Creatinine, Ser: 0.96 mg/dL (ref 0.61–1.24)
GFR, Estimated: >60 mL/min
Glucose, Bld: 353 mg/dL — ABNORMAL HIGH (ref 70–99)
Potassium: 4.4 mmol/L (ref 3.5–5.1)
Sodium: 134 mmol/L — ABNORMAL LOW (ref 135–145)
Total Bilirubin: 0.5 mg/dL (ref 0.0–1.2)
Total Protein: 7.5 g/dL (ref 6.5–8.1)

## 2024-12-19 LAB — PRO BRAIN NATRIURETIC PEPTIDE: Pro Brain Natriuretic Peptide: 646 pg/mL — ABNORMAL HIGH

## 2024-12-19 LAB — ETHANOL: Alcohol, Ethyl (B): <15 mg/dL

## 2024-12-19 LAB — CBG MONITORING, ED
Glucose-Capillary: 401 mg/dL — ABNORMAL HIGH (ref 70–99)
Glucose-Capillary: 238 mg/dL — ABNORMAL HIGH (ref 70–99)

## 2024-12-19 LAB — GLUCOSE, CAPILLARY: Glucose-Capillary: 582 mg/dL (ref 70–99)

## 2024-12-19 MED ORDER — METFORMIN HCL 500 MG PO TABS
500.0000 mg | ORAL_TABLET | Freq: Once | ORAL | Status: AC
  Administered 2024-12-19: 500 mg via ORAL
  Filled 2024-12-19: qty 1

## 2024-12-19 MED ORDER — INSULIN ASPART 100 UNIT/ML IV SOLN
10.0000 [IU] | Freq: Once | INTRAVENOUS | Status: AC
  Administered 2024-12-19: 10 [IU] via INTRAVENOUS
  Filled 2024-12-19: qty 10

## 2024-12-19 MED ORDER — METFORMIN HCL 500 MG PO TABS
ORAL_TABLET | ORAL | 3 refills | Status: AC
  Filled 2024-12-19: qty 180, 56d supply, fill #0

## 2024-12-19 NOTE — ED Notes (Signed)
 [employer] called to transport pt to [facility].
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
## 2024-12-19 NOTE — ED Notes (Signed)
 Pts glucose is 582
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
## 2024-12-19 NOTE — ED Notes (Signed)
 Pt was assisted with putting his clothes and jackets back on him per pt's request. Pt was then assisted to the bathroom and back to his assigned bed. Pt was then giving lunch bag and diet [place] for nourishment. LSW was at the bedside talking with the pt.

## 2024-12-19 NOTE — ED Triage Notes (Signed)
 BIB EMS from [facility] r/t lethargy during intake process. Patient arousable to verbal. Endorsing ETOH last night. Denies any pain. EMS CBG 586. A&Ox4.

## 2024-12-19 NOTE — ED Provider Notes (Signed)
 " [facility] Provider Note   CSN: [id number] Arrival date & time: 12/19/24  1336     Patient presents with: Fatigue   Brady Church is a 64 y.o. male.   This is a 64 year old male presenting emergency department after a near syncopal type episode to be [facility].  He is being evaluated for polysubstance abuse alcohol abuse.  Upon case management discussion with outpatient resources he became lightheaded and diaphoretic.  Lowered himself to the floor.  Per the note from [facility] did not syncopized, however patient cannot recall necessarily what had happened.  It was noted his blood sugar was in the 580s.  He has not been taking his metformin .  Does not feel that he is overtly withdrawing currently.  Denies chest pain, shortness of breath, URI symptoms.  No abdominal pain.        Prior to Admission medications  Medication Sig Start Date End Date Taking? Authorizing Provider  aspirin  EC 81 MG tablet Take 1 tablet (81 mg total) by mouth daily. Swallow whole. Patient not taking: Reported on 12/16/2024 04/24/24   Stephanie Freund, MD  atorvastatin  (LIPITOR ) 80 MG tablet Take 1 tablet (80 mg total) by mouth daily at 6 PM. Patient not taking: Reported on 12/16/2024 06/04/24   Rosan Dayton BROCKS, DO  bimatoprost  (LUMIGAN ) 0.01 % SOLN Place 1 drop into the right eye every evening. Patient not taking: Reported on 12/16/2024 03/10/24     Brinzolamide -Brimonidine  (SIMBRINZA ) 1-0.2 % SUSP Place 1 drop into the right eye 2 (two) times daily. Patient not taking: Reported on 12/16/2024 03/10/24     Brinzolamide -Brimonidine  (SIMBRINZA ) 1-0.2 % SUSP Place 1 drop into the right eye 2 (two) times daily. Patient not taking: Reported on 12/16/2024 10/15/24     folic acid  (FOLVITE ) 1 MG tablet Take 1 tablet (1 mg total) by mouth daily. Patient not taking: Reported on 12/16/2024 11/30/23   Marylu Gee, DO  losartan  (COZAAR ) 25 MG tablet Take 1 tablet (25 mg total) by mouth at  bedtime. Patient not taking: Reported on 12/16/2024 06/04/24   Rosan Dayton BROCKS, DO  metFORMIN  (GLUCOPHAGE ) 500 MG tablet Take 1 tablet (500mg ) every morning for one week, then take 1 tablet (500 mg) every morning and 1 tablet (500 mg) at night for one week, then take 2 tablets (1,000 mg) every morning and 1 tablet (500 mg) every night for one week, then take 2 tablets (1,000 mg) every morning and 2 tablets (1,000 mg) every night. Patient not taking: Reported on 12/16/2024 06/04/24   Rosan Dayton BROCKS, DO  Miconazole  Nitrate 2 % AERP Apply topically daily to feet daily before putting socks on Patient not taking: Reported on 12/16/2024 01/07/24   McCaughan, Dia D, DPM  naproxen  (NAPROSYN ) 375 MG tablet Take 1 tablet (375 mg total) by mouth 2 (two) times daily. Patient not taking: Reported on 12/16/2024 11/15/24   Randol Simmonds, MD  Netarsudil  Dimesylate (RHOPRESSA ) 0.02 % SOLN Place 1 drop into the right eye every evening. Patient not taking: Reported on 12/16/2024 03/10/24     Netarsudil  Dimesylate (RHOPRESSA ) 0.02 % SOLN Place 1 drop into the right eye at bedtime. Patient not taking: Reported on 12/16/2024 10/15/24     sildenafil  (VIAGRA ) 50 MG tablet Take 0.5 tablets (25 mg total) by mouth daily as needed for erectile dysfunction. Patient not taking: Reported on 12/16/2024 10/15/24   Nguyen, Diana, MD  SIMBRINZA  1-0.2 % SUSP Place 1 drop into the right eye 2 (  two) times daily. Patient not taking: Reported on 12/16/2024 10/02/23   Narendra, Nischal, MD  spironolactone  (ALDACTONE ) 25 MG tablet Take 0.5 tablets (12.5 mg total) by mouth daily. Patient not taking: Reported on 12/16/2024 06/04/24   Nooruddin, Saad, MD  timolol  (TIMOPTIC ) 0.25 % ophthalmic solution Place 1 drop into the right eye 2 (two) times daily. Patient not taking: Reported on 12/16/2024 10/15/24     timolol  (TIMOPTIC ) 0.5 % ophthalmic solution Place 1 drop into the right eye 2 (two) times daily. Patient not taking: Reported on 12/16/2024 11/30/23    Marylu Gee, DO  timolol  (TIMOPTIC ) 0.5 % ophthalmic solution Place 1 drop into the right eye 2 (two) times daily. Patient not taking: Reported on 12/16/2024 03/10/24       Allergies: Patient has no known allergies.    Review of Systems  Updated Vital Signs BP (!) 143/95   Pulse 77   Temp 98.3 F (36.8 C) (Oral)   Resp 18   Ht 5' 9.6 (1.768 m)   Wt 97.5 kg   SpO2 100%   BMI 31.20 kg/m   Physical Exam Vitals and nursing note reviewed.  Constitutional:      General: He is not in acute distress.    Appearance: He is not toxic-appearing.  HENT:     Head: Normocephalic.     Nose: Nose normal.     Mouth/Throat:     Mouth: Mucous membranes are moist.  Eyes:     Conjunctiva/sclera: Conjunctivae normal.  Cardiovascular:     Rate and Rhythm: Normal rate and regular rhythm.  Pulmonary:     Effort: Pulmonary effort is normal.     Breath sounds: Normal breath sounds.  Abdominal:     General: Abdomen is flat. There is no distension.     Tenderness: There is no abdominal tenderness. There is no guarding or rebound.  Skin:    General: Skin is warm and dry.     Capillary Refill: Capillary refill takes less than 2 seconds.  Neurological:     General: No focal deficit present.     Mental Status: He is alert and oriented to person, place, and time.  Psychiatric:        Mood and Affect: Mood normal.        Behavior: Behavior normal.     (all labs ordered are listed, but only abnormal results are displayed) Labs Reviewed  I-STAT CHEM 8, ED - Abnormal; Notable for the following components:      Result Value   Sodium 132 (*)    Potassium 7.0 (*)    Glucose, Bld 395 (*)    Calcium , Ion 0.97 (*)    All other components within normal limits  CBG MONITORING, ED - Abnormal; Notable for the following components:   Glucose-Capillary 401 (*)    All other components within normal limits  I-STAT VENOUS BLOOD GAS, ED - Abnormal; Notable for the following components:   pH, Ven 7.434  (*)    pCO2, Ven 43.9 (*)    pO2, Ven 129 (*)    Bicarbonate 29.4 (*)    Acid-Base Excess 4.0 (*)    Sodium 131 (*)    Potassium 7.0 (*)    Calcium , Ion 0.97 (*)    All other components within normal limits  CBC WITH DIFFERENTIAL/PLATELET  ETHANOL  COMPREHENSIVE METABOLIC PANEL WITH GFR  PRO BRAIN NATRIURETIC PEPTIDE    EKG: EKG Interpretation Date/Time:  Friday December 19 2024 14:41:28 EST Ventricular Rate:  84 PR Interval:  167 QRS Duration:  102 QT Interval:  393 QTC Calculation: 465 R Axis:   -33  Text Interpretation: Sinus rhythm LVH with secondary repolarization abnormality Confirmed by Neysa Clap [phone]) on 12/19/2024 2:42:03 PM  Radiology: DG Chest 2 View Result Date: 12/19/2024 CLINICAL DATA:  Syncope. EXAM: CHEST - 2 VIEW COMPARISON:  11/15/2024. FINDINGS: The heart size and mediastinal contours are unchanged. No overt pulmonary edema, focal consolidation, pleural effusion or pneumothorax. No acute osseous abnormality. IMPRESSION: No acute cardiopulmonary findings. Electronically Signed   By: Harrietta Sherry M.D.   On: 12/19/2024 15:05     Procedures   Medications Ordered in the ED  insulin  aspart (novoLOG ) injection 10 Units (10 Units Intravenous Given 12/19/24 1523)    Clinical Course as of 12/19/24 1617  Fri Dec 19, 2024  1347 Per [facility] note from today per my chart review: While meeting with the CM team who presented to the [facility] to see the patient, he lowered self to the floor, and stopped answering their questions. Writer was called and went to see patient. On presenting to the area, pt was still responsive, but reported feeling tired, was diaphoretic, stated he felt dizzy, was a diabetic, had not eaten nor was he taking his Metformin  which he was supposed to be taking, which he states is 1000mg  BID. This triggered for a blood glucose to be checked and was 582. Dr Darra was called and pt sent to the ER to be medically stabilized as we are not equiped to  stabilize this patient here at the [facility].Dr Darra notified that resources will be placed in pt's after visit summary, and he can be discharged back to the community from the ER. Patient may go to [facility] as needed using a cab voucher from the ER once medically stabilized  [TY]  1357 Echo in 2024; 1. Left ventricular ejection fraction, by estimation, is 30 to 35%. The  left ventricle has moderately decreased function. The left ventricle  demonstrates global hypokinesis. The left ventricular internal cavity size  was mildly dilated. There is mild  concentric left ventricular hypertrophy. Left ventricular diastolic  parameters are consistent with Grade I diastolic dysfunction (impaired  relaxation).   2. Right ventricular systolic function is normal. The right ventricular  size is normal.   3. The mitral valve is normal in structure. No evidence of mitral valve  regurgitation. No evidence of mitral stenosis.   4. The aortic valve is tricuspid. Aortic valve regurgitation is not  visualized. Aortic valve sclerosis is present, with no evidence of aortic  valve stenosis.   5. The inferior vena cava is normal in size with greater than 50%  respiratory variability, suggesting right atrial pressure of 3 mmHg.   [TY]  1515 Potassium(!!): 7.0 Not having EKG changes.  He is quite hyperglycemic.  Will give insulin . [TY]  1546 DG Chest 2 View IMPRESSION: No acute cardiopulmonary findings.   Electronically Signed   [TY]    Clinical Course User Index [TY] Neysa Clap PARAS, DO                                 Medical Decision Making This is a 64 year old male presenting emergency department after a near syncopal/syncopal episode at the behavioral health urgent care.  Noted to be hyperglycemic there with a blood sugar of 580.  He is afebrile nontachycardic, slightly hypertensive here.  Does not appear  to be in overt distress.  He has no localizing neurodeficits.  No overt external sources  of infection.  He readily admits to not taking his metformin  which I suspect is the cause of his elevated blood sugar.  Unclear if he truly syncopized or not.  Does not appear to be actively withdrawing.  Alert and oriented.  Clinically sober.  Did have a potassium of 7 on i-STAT, but normal renal function.  No EKG changes consistent with hyperkalemia.  Given insulin  doses blood sugar is significantly elevated.  Does not appear to be in overt DKA.  Care signed out to afternoon team disposition pending completion of workup.  Amount and/or Complexity of Data Reviewed External Data Reviewed:     Details: See ED course Labs: ordered. Decision-making details documented in ED Course. Radiology: ordered and independent interpretation performed. Decision-making details documented in ED Course.    Details: Do not appreciate pneumothorax or widened any sign ECG/medicine tests: ordered and independent interpretation performed.    Details: No changes consistent with hyperkalemia  Risk OTC drugs. Decision regarding hospitalization. Diagnosis or treatment significantly limited by social determinants of health. Risk Details: Polysubstance abuse       Final diagnoses:  None    ED Discharge Orders     None          Neysa Caron PARAS, DO 12/19/24 1617  "

## 2024-12-19 NOTE — ED Notes (Signed)
 Pt belongings in 3 bags on chairs beside bed. Labels on belongings bag.

## 2024-12-19 NOTE — ED Provider Notes (Signed)
 Sent over from [facility] for med clearance.  Syncope/near syncope at [facility].  Was diaphoretic at the scene and a brief episode.  He is now assymptomatic at this time and no complaints.  Cleared by [facility].  Pt does have K of 7 on istat but normal creatinine and elevated glucose of 395 but not taking DM meds.  VBG without acute findings with compensated respiratory acidosis. EKG and CXR wnl.  CMP pending.  5:51 PM CMP with normal potassium, glucose is improved at 238, BNP without acute findings.  On reevaluation patient is sleeping however when waking up he reports he feels fine. Feel that patient can be discharged home as long as he can ambulate without difficulty. Patient got up and was able to ambulate around the emergency room without any difficulty.  At this time he appears stable for discharge.   Doretha Folks, MD 12/19/24 [phone]

## 2024-12-19 NOTE — ED Notes (Signed)
 Patient is being transferred to [facility] by EMS at this time. All valuables and belongings returned to patient. Per [facility] provider's note , .SABRASABRAOn presenting to the area, pt was still responsive, but reported feeling tired, was diaphoretic, stated he felt dizzy, was a diabetic, had not eaten nor was he taking his Metformin  which he was supposed to be taking, which he states is 1000mg  BID. This triggered for a blood glucose to be checked and was 582. Dr Darra was called and pt sent to the ER to be medically stabilized as we are not equiped to stabilize this patient here at the [facility].Dr Darra notified that resources will be placed in pt's after visit summary, and he can be discharged back to the community from the ER. Patient may go to [facility] shelters as needed using a cab voucher from the ER once medically stabilized.

## 2024-12-19 NOTE — Discharge Instructions (Addendum)
 A new prescription for your metformin  was sent to the [facility] pharmacy that you can pick it up Monday.  Otherwise your labs look good.  Make sure you are drinking plenty of fluids to stay hydrated.

## 2024-12-19 NOTE — Discharge Instructions (Addendum)
 Based on the information that you have provided and the presenting issues outpatient services and resources for have been recommended.  It is imperative that you follow through with treatment recommendations within 5-7 days from the of discharge to mitigate further risk to your safety and mental well-being. A list of referrals has been provided below to get you started.  You are not limited to the list provided.  In case of an urgent crisis, you may contact the Mobile Crisis Unit with [facility] at [phone].   Patient was linked to Peer Support Case Management Services, Outpatient therapy and Outpatient Substance Treatment with [facility] Address: [address] [place] [facility] [address] [address] [phone] Intake Line  [phone] Office    Writer will provide a taxi voucher for the patient to be transported to the [facility] ([employer]).  The Peer Support Team will be able to follow up with the patient.     [facility]  [facility] [address] [place], [place], [address] [phone] phone  Offers food and emergency or transitional housing to men, women, or families in need. Clients participate in programs and workshops developed to promote self-sufficiency and personal development.Call or walk in. Applications are accepted Monday, Wednesday, and Friday by appointment only. Need photo ID and proof of income.  [facility] [address] [place], [place] [address] [phone] Population served: Adult men & women (79 years old and older, able to perform activities for daily living) Documents required: Valid ID & Social Security Card  [facility] [address] [place], [place]  [address] [phone] Population served: Families with children  [facility] [address] [place], [place]  [address] [phone] Population served: Single women 18+ without  dependents Documents required:  Valid ID & Social Security Card  [facility] [address], [place], [place]  [address] [phone] Population served: Male veterans 18+ with substance abuse/mental health issues Eligibility: By referral only  [facility] [address] [place], [place] [address] [phone] Population served: Males 18+ Documents required: Valid ID & Social Security Card  [facility] at [employer] [facility] [employer] [address] [place] [address] [phone] or [phone] Population served: Pregnant women with or without children  Documents required: Valid ID & Social Security [employer] [address], [place], [place] [address] [phone] Population Served: Families with children  [facility] [address], [place], [place] [address] [phone] Population served: Men 18+, preference for disabled and/or veterans Eligibility: By referral only  [facility]) - [facility] [address] [place], [place], [place] [address] [phone] or [phone] Population served: Families with children.    WOMEN ONLY  The Shelter serves up to 20 women each night. Open from December 11th through the end of March in the evenings from 5:30 pm until 7:30 am, the Shelter provides a hot evening meal, shower and sleeping facilities, and food for the next day. Secure parking is available beside the building.     Shelter Address   Directions [facility] [place] [address] [place], [place]  If you are in need of housing through the shelter, contact the [employer] at [phone].
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
## 2024-12-20 ENCOUNTER — Other Ambulatory Visit (HOSPITAL_COMMUNITY): Payer: Self-pay

## 2025-01-01 ENCOUNTER — Other Ambulatory Visit (HOSPITAL_COMMUNITY): Payer: Self-pay

## 2025-01-02 ENCOUNTER — Ambulatory Visit: Payer: Self-pay | Admitting: Student

## 2025-01-05 ENCOUNTER — Ambulatory Visit: Payer: Self-pay | Admitting: Student

## 2025-01-08 ENCOUNTER — Ambulatory Visit: Payer: MEDICAID | Admitting: Podiatry
